# Patient Record
Sex: Female | Born: 1935
Health system: Southern US, Community
[De-identification: ages and names within clinical notes are randomized; demographics above are authoritative.]

## PROBLEM LIST (undated history)

## (undated) DIAGNOSIS — G250 Essential tremor: Secondary | ICD-10-CM

## (undated) DIAGNOSIS — Z86718 Personal history of other venous thrombosis and embolism: Secondary | ICD-10-CM

## (undated) DIAGNOSIS — G252 Other specified forms of tremor: Secondary | ICD-10-CM

## (undated) DIAGNOSIS — D689 Coagulation defect, unspecified: Secondary | ICD-10-CM

## (undated) DIAGNOSIS — A0472 Enterocolitis due to Clostridium difficile, not specified as recurrent: Secondary | ICD-10-CM

## (undated) DIAGNOSIS — F419 Anxiety disorder, unspecified: Secondary | ICD-10-CM

## (undated) DIAGNOSIS — N189 Chronic kidney disease, unspecified: Secondary | ICD-10-CM

## (undated) DIAGNOSIS — I10 Essential (primary) hypertension: Secondary | ICD-10-CM

## (undated) DIAGNOSIS — H269 Unspecified cataract: Secondary | ICD-10-CM

## (undated) DIAGNOSIS — B029 Zoster without complications: Secondary | ICD-10-CM

## (undated) DIAGNOSIS — T7840XA Allergy, unspecified, initial encounter: Secondary | ICD-10-CM

## (undated) DIAGNOSIS — I2699 Other pulmonary embolism without acute cor pulmonale: Secondary | ICD-10-CM

## (undated) DIAGNOSIS — G459 Transient cerebral ischemic attack, unspecified: Secondary | ICD-10-CM

## (undated) DIAGNOSIS — C801 Malignant (primary) neoplasm, unspecified: Secondary | ICD-10-CM

## (undated) DIAGNOSIS — B370 Candidal stomatitis: Secondary | ICD-10-CM

## (undated) DIAGNOSIS — I639 Cerebral infarction, unspecified: Secondary | ICD-10-CM

## (undated) DIAGNOSIS — E785 Hyperlipidemia, unspecified: Secondary | ICD-10-CM

## (undated) DIAGNOSIS — M810 Age-related osteoporosis without current pathological fracture: Secondary | ICD-10-CM

## (undated) DIAGNOSIS — D649 Anemia, unspecified: Secondary | ICD-10-CM

## (undated) HISTORY — DX: Allergy, unspecified, initial encounter: T78.40XA

## (undated) HISTORY — PX: ABDOMINAL HYSTERECTOMY: SHX81

## (undated) HISTORY — DX: Zoster without complications: B02.9

## (undated) HISTORY — DX: Coagulation defect, unspecified: D68.9

## (undated) HISTORY — DX: Anemia, unspecified: D64.9

## (undated) HISTORY — DX: Enterocolitis due to Clostridium difficile, not specified as recurrent: A04.72

## (undated) HISTORY — DX: Unspecified cataract: H26.9

## (undated) HISTORY — DX: Cerebral infarction, unspecified: I63.9

## (undated) HISTORY — PX: FRACTURE SURGERY: SHX138

## (undated) HISTORY — DX: Malignant (primary) neoplasm, unspecified: C80.1

## (undated) HISTORY — PX: EYE SURGERY: SHX253

## (undated) HISTORY — DX: Personal history of other venous thrombosis and embolism: Z86.718

## (undated) HISTORY — DX: Transient cerebral ischemic attack, unspecified: G45.9

## (undated) HISTORY — DX: Candidal stomatitis: B37.0

## (undated) HISTORY — DX: Other specified forms of tremor: G25.0

## (undated) HISTORY — DX: Essential tremor: G25.2

## (undated) HISTORY — DX: Essential (primary) hypertension: I10

## (undated) HISTORY — DX: Hyperlipidemia, unspecified: E78.5

## (undated) HISTORY — DX: Age-related osteoporosis without current pathological fracture: M81.0

## (undated) HISTORY — DX: Chronic kidney disease, unspecified: N18.9

## (undated) HISTORY — DX: Anxiety disorder, unspecified: F41.9

## (undated) HISTORY — PX: OOPHORECTOMY: SHX86

---
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
## 1959-12-10 HISTORY — PX: APPENDECTOMY: SHX54

## 1995-12-10 DIAGNOSIS — Z86718 Personal history of other venous thrombosis and embolism: Secondary | ICD-10-CM

## 1995-12-10 HISTORY — DX: Personal history of other venous thrombosis and embolism: Z86.718

## 1995-12-10 HISTORY — PX: ABDOMINAL HYSTERECTOMY: SHX81

## 1995-12-10 HISTORY — PX: TOTAL ABDOMINAL HYSTERECTOMY W/ BILATERAL SALPINGOOPHORECTOMY: SHX83

## 1998-04-19 ENCOUNTER — Other Ambulatory Visit: Admission: RE | Admit: 1998-04-19 | Discharge: 1998-04-19 | Payer: Self-pay | Admitting: Gynecology

## 1998-06-26 ENCOUNTER — Other Ambulatory Visit: Admission: RE | Admit: 1998-06-26 | Discharge: 1998-06-26 | Payer: Self-pay | Admitting: *Deleted

## 1998-10-23 ENCOUNTER — Other Ambulatory Visit: Admission: RE | Admit: 1998-10-23 | Discharge: 1998-10-23 | Payer: Self-pay | Admitting: Gynecology

## 1999-04-26 ENCOUNTER — Other Ambulatory Visit: Admission: RE | Admit: 1999-04-26 | Discharge: 1999-04-26 | Payer: Self-pay | Admitting: Gynecology

## 1999-08-11 ENCOUNTER — Encounter: Payer: Self-pay | Admitting: Emergency Medicine

## 1999-08-11 ENCOUNTER — Emergency Department (HOSPITAL_COMMUNITY): Admission: EM | Admit: 1999-08-11 | Discharge: 1999-08-11 | Payer: Self-pay | Admitting: Emergency Medicine

## 1999-11-15 ENCOUNTER — Other Ambulatory Visit: Admission: RE | Admit: 1999-11-15 | Discharge: 1999-11-15 | Payer: Self-pay | Admitting: Gynecology

## 2000-06-03 ENCOUNTER — Other Ambulatory Visit: Admission: RE | Admit: 2000-06-03 | Discharge: 2000-06-03 | Payer: Self-pay | Admitting: Gynecology

## 2000-11-25 ENCOUNTER — Other Ambulatory Visit: Admission: RE | Admit: 2000-11-25 | Discharge: 2000-11-25 | Payer: Self-pay | Admitting: Gynecology

## 2001-05-06 ENCOUNTER — Ambulatory Visit (HOSPITAL_COMMUNITY): Admission: RE | Admit: 2001-05-06 | Discharge: 2001-05-06 | Payer: Self-pay | Admitting: Cardiovascular Disease

## 2001-06-09 ENCOUNTER — Other Ambulatory Visit: Admission: RE | Admit: 2001-06-09 | Discharge: 2001-06-09 | Payer: Self-pay | Admitting: Gynecology

## 2001-12-14 ENCOUNTER — Other Ambulatory Visit: Admission: RE | Admit: 2001-12-14 | Discharge: 2001-12-14 | Payer: Self-pay | Admitting: Gynecology

## 2002-07-08 ENCOUNTER — Other Ambulatory Visit: Admission: RE | Admit: 2002-07-08 | Discharge: 2002-07-08 | Payer: Self-pay | Admitting: Gynecology

## 2003-07-12 ENCOUNTER — Other Ambulatory Visit: Admission: RE | Admit: 2003-07-12 | Discharge: 2003-07-12 | Payer: Self-pay | Admitting: Gynecology

## 2004-08-02 ENCOUNTER — Other Ambulatory Visit: Admission: RE | Admit: 2004-08-02 | Discharge: 2004-08-02 | Payer: Self-pay | Admitting: Gynecology

## 2005-06-14 ENCOUNTER — Ambulatory Visit: Payer: Self-pay

## 2005-08-08 ENCOUNTER — Other Ambulatory Visit: Admission: RE | Admit: 2005-08-08 | Discharge: 2005-08-08 | Payer: Self-pay | Admitting: Gynecology

## 2006-08-14 ENCOUNTER — Other Ambulatory Visit: Admission: RE | Admit: 2006-08-14 | Discharge: 2006-08-14 | Payer: Self-pay | Admitting: Gynecology

## 2007-08-25 ENCOUNTER — Other Ambulatory Visit: Admission: RE | Admit: 2007-08-25 | Discharge: 2007-08-25 | Payer: Self-pay | Admitting: Gynecology

## 2009-02-23 ENCOUNTER — Encounter (INDEPENDENT_AMBULATORY_CARE_PROVIDER_SITE_OTHER): Payer: Self-pay | Admitting: Orthopedic Surgery

## 2009-02-23 ENCOUNTER — Ambulatory Visit (HOSPITAL_BASED_OUTPATIENT_CLINIC_OR_DEPARTMENT_OTHER): Admission: RE | Admit: 2009-02-23 | Discharge: 2009-02-23 | Payer: Self-pay | Admitting: Orthopedic Surgery

## 2011-03-21 LAB — POCT HEMOGLOBIN-HEMACUE: Hemoglobin: 13.6 g/dL (ref 12.0–15.0)

## 2011-04-23 NOTE — Op Note (Signed)
NAME:  Monica Curry, Monica Curry NO.:  [id number]   MEDICAL RECORD NO.:  [id number]          PATIENT TYPE:  AMB   LOCATION:  DSC                          FACILITY:  [facility]   PHYSICIAN:  Cindee Salt, M.D.       DATE OF BIRTH:  27-Jul-1931   DATE OF PROCEDURE:  DATE OF DISCHARGE:                               OPERATIVE REPORT   PREOPERATIVE DIAGNOSIS:  Mass right thumb.   POSTOPERATIVE DIAGNOSIS:  Mass right thumb.   OPERATION:  Excision mass right thumb.   SURGEON:  Cindee Salt, MD   ASSISTANT:  Carolyne Fiscal, RN   ANESTHESIA:  Forearm-based IV regional.   ANESTHESIOLOGIST:  Dr. Jean Rosenthal.   HISTORY:  The patient is a 77 year old female with a history of a mass  on the pulp of her right thumb.  This has become painful for her.  She  is desirous of having this excised.  She is aware of risks and  complications including infection, recurrence of injury to arteries,  nerves, tendons, incomplete relief of symptoms, dystrophy, painful scar  in the pulp.  In the preoperative area, the patient is seen, the  extremity marked by both the patient and surgeon, antibiotic given, TED  stockings placed for a history of DVT.   The patient was brought to the operating room where a forearm-based IV  regional anesthetic was carried out without difficulty.  She was prepped  using ChloraPrep, supine position with her right arm free.  A time-out  was taken.  A metacarpal block was given with 0.25% Marcaine without  epinephrine.  An incision was made on the radial aspect of the thumb  pulp, carried down through subcutaneous tissue, this was directly over  the mass.  Blunt and sharp dissection, this was followed down to a  deeply seated pleural type mass which was extremely hard.  This was  found to have eroded into the bone.  This was excised in toto.  The area  was curetted.  The specimen was sent to Pathology.  No further lesions  were identified.  The wound was irrigated.  The skin closed with  interrupted 5-0 Vicryl Rapide sutures.  A compressive dressing to the  thumb was applied.  The patient tolerated the procedure well and was  taken to the recovery room for observation in satisfactory condition.  She will be discharged home to return to the [facility]  in 1 week on [place].           ______________________________  Cindee Salt, M.D.    GK/MEDQ  D:  02/23/2009  T:  02/23/2009  Job:  [id number]   cc:   Lovenia Kim, D.O.

## 2011-10-10 DIAGNOSIS — M810 Age-related osteoporosis without current pathological fracture: Secondary | ICD-10-CM | POA: Insufficient documentation

## 2011-10-15 ENCOUNTER — Other Ambulatory Visit: Payer: Self-pay | Admitting: Gynecology

## 2012-10-20 ENCOUNTER — Other Ambulatory Visit: Payer: Self-pay | Admitting: Gynecology

## 2013-03-24 ENCOUNTER — Other Ambulatory Visit (HOSPITAL_COMMUNITY): Payer: Self-pay | Admitting: Internal Medicine

## 2013-03-24 ENCOUNTER — Encounter (HOSPITAL_COMMUNITY): Payer: Self-pay

## 2013-03-24 ENCOUNTER — Ambulatory Visit (HOSPITAL_COMMUNITY)
Admission: RE | Admit: 2013-03-24 | Discharge: 2013-03-24 | Disposition: A | Discharge status: Home / Self Care | Payer: Medicare Other | Source: Ambulatory Visit | Attending: Internal Medicine | Admitting: Internal Medicine

## 2013-03-24 DIAGNOSIS — M5137 Other intervertebral disc degeneration, lumbosacral region: Secondary | ICD-10-CM | POA: Insufficient documentation

## 2013-03-24 DIAGNOSIS — M546 Pain in thoracic spine: Secondary | ICD-10-CM | POA: Insufficient documentation

## 2013-03-24 DIAGNOSIS — M4120 Other idiopathic scoliosis, site unspecified: Secondary | ICD-10-CM | POA: Insufficient documentation

## 2013-03-24 DIAGNOSIS — M51379 Other intervertebral disc degeneration, lumbosacral region without mention of lumbar back pain or lower extremity pain: Secondary | ICD-10-CM | POA: Insufficient documentation

## 2013-03-24 DIAGNOSIS — IMO0002 Reserved for concepts with insufficient information to code with codable children: Secondary | ICD-10-CM

## 2013-07-02 ENCOUNTER — Encounter: Payer: Self-pay | Admitting: Neurology

## 2013-07-02 ENCOUNTER — Ambulatory Visit (INDEPENDENT_AMBULATORY_CARE_PROVIDER_SITE_OTHER): Payer: Medicare Other | Admitting: Neurology

## 2013-07-02 VITALS — BP 143/78 | HR 53 | Temp 97.0°F | Ht 65.0 in | Wt 148.0 lb

## 2013-07-02 DIAGNOSIS — F411 Generalized anxiety disorder: Secondary | ICD-10-CM

## 2013-07-02 DIAGNOSIS — F419 Anxiety disorder, unspecified: Secondary | ICD-10-CM

## 2013-07-02 DIAGNOSIS — G252 Other specified forms of tremor: Secondary | ICD-10-CM

## 2013-07-02 DIAGNOSIS — G250 Essential tremor: Secondary | ICD-10-CM

## 2013-07-02 MED ORDER — PROPRANOLOL HCL 20 MG PO TABS: 20.0000 mg | ORAL_TABLET | Freq: Three times a day (TID) | ORAL | Status: DC

## 2013-07-02 MED ORDER — CLONAZEPAM 0.5 MG PO TABS: ORAL_TABLET | ORAL | Status: DC

## 2013-07-02 NOTE — Patient Instructions (Addendum)
I think overall you are doing fairly well but I do want to suggest a few things today:  Remember to drink plenty of fluid, eat healthy meals and do not skip any meals. Try to eat protein with a every meal and eat a healthy snack such as fruit or nuts in between meals. Try to keep a regular sleep-wake schedule and try to exercise daily, particularly in the form of walking, 20-30 minutes a day, if you can.   As far as your medications are concerned, I would like to suggest no new changes.    As far as diagnostic testing: no new test needed.    I would like to see you back in 6 months, sooner if we need to. Please call us with any interim questions, concerns, problems, updates or refill requests.  Please also call us for any test results so we can go over those with you on the phone. Brett Canales is my clinical assistant and will answer any of your questions and relay your messages to me and also relay most of my messages to you.  Our phone number is [phone]. We also have an after hours call service for urgent matters and there is a physician on-call for urgent questions. For any emergencies you know to call 911 or go to the nearest emergency room.

## 2013-07-02 NOTE — Progress Notes (Signed)
Subjective:    Patient ID: Monica Curry is a 79 y.o. female.  HPI  Interim history:   Monica Curry is a very pleasant 79 year old right-handed woman who presents for followup consultation of her tremors. She previously saw Dr. Fayrene Fearing love and was last seen by him on 12/31/2012, and which time he started her on propranolol. She has an underlying medical history of uterine cancer, anxiety and PE. She is status post hysterectomy and tonsillectomy. She felt the propranolol 60 mg long-acting once daily was not as effective and is now back on propranolol 20 mg tid, clonazepam 0.5 mg tablet one in the morning, have known and half in the evening, Premarin, calcium and vitamin D, baby aspirin and atelvia 1/week. Activity makes the tremor worse. She does note an improvement with alcohol and takes only one propranolol when she has had a drink. She drinks occasionally.   I reviewed Dr. Imagene Gurney prior notes and the patient's records and below is a summary of that review:  79 year old right-handed woman with a history of tremor for many years, treated with clonazepam for a number of years, but tremor exacerbated in 1997 after her pulmonary embolus. She had withdrawal symptoms coming off of clonazepam. She had been on Mysoline but did not tolerate it. She has seen several other doctors for this tremor. TSH was negative in the past. She was tried on methazolamide without benefit.   She has no new complaints today. She had blood work in 03/24/13 and I reviewed it, and the TSH, Lipids, e-lytes and CBC, Vit D were normal. X ray lumbar spine from 03/24/13 showed T9 compression Fx, age indeterminate.   Her Past Medical History Is Significant For: Past Medical History  Diagnosis Date  . Disorder of bone and cartilage, unspecified   . Personal history of malignant neoplasm of other parts of uterus   . Personal history of venous thrombosis and embolism   . Essential and other specified forms of tremor     Her Past  Surgical History Is Significant For: Past Surgical History  Procedure Laterality Date  . Cesarean section  1961  . Abdominal hysterectomy  1997  . Tonsilectomy/adenoidectomy with myringotomy  1944    Her Family History Is Significant For: Family History  Problem Relation Age of Onset  . Cancer Mother   . Cancer Father   . Cancer Brother   . Cancer Brother   . Heart disease Brother   . Heart disease Brother   . Heart disease Sister   . Heart disease Sister   . Diabetes Sister     Her Social History Is Significant For: History   Social History  . Marital Status: Widowed    Spouse Name: N/A    Number of Children: N/A  . Years of Education: N/A   Social History Main Topics  . Smoking status: Never Smoker   . Smokeless tobacco: None  . Alcohol Use: 0.5 oz/week    1 drink(s) per week  . Drug Use: No  . Sexually Active: None   Other Topics Concern  . None   Social History Narrative  . None    Her Allergies Are:  Allergies  Allergen Reactions  . Codeine   :   Her Current Medications Are:  Outpatient Encounter Prescriptions as of 07/02/2013  Medication Sig Dispense Refill  . aspirin 81 MG tablet Take 81 mg by mouth daily.      . ATELVIA 35 MG TBEC       .  Calcium-Magnesium-Vitamin D 500-50-100 MG-MG-UNIT CHEW Chew 1 tablet by mouth daily.      . Cholecalciferol (VITAMIN D PO) Take 1 tablet by mouth daily.      . clonazePAM (KLONOPIN) 0.5 MG tablet Take 1 tablet by mouth as needed.      Marland Kitchen PREMARIN vaginal cream       . propranolol (INDERAL) 20 MG tablet Take 1 tablet by mouth 3 (three) times daily.       No facility-administered encounter medications on file as of 07/02/2013.  : Review of Systems  HENT: Positive for tinnitus.   Neurological: Positive for tremors.       Restless leg  Hematological: Bruises/bleeds easily.    Objective:  Neurologic Exam  Physical Exam Physical Examination:   Filed Vitals:   07/02/13 1108  BP: 143/78  Pulse: 53  Temp:  97 F (36.1 C)    General Examination: The patient is a very pleasant 79 y.o. female in no acute distress. She appears well-developed and well-nourished and well groomed.   HEENT: Normocephalic, atraumatic, pupils are equal, round and reactive to light and accommodation. Extraocular tracking is good without limitation to gaze excursion or nystagmus noted. Normal smooth pursuit is noted. Hearing is grossly intact. Face is symmetric with normal facial animation and normal facial sensation. Speech is clear with no dysarthria noted. There is no hypophonia. There is a mild lip, neck/head, and jaw tremor and a minimal voice tremor. Neck is supple with full range of passive and active motion. There are no carotid bruits on auscultation. Oropharynx exam reveals: mild mouth dryness, adequate dental hygiene and mild airway crowding. Mallampati is class II. Tongue protrudes centrally and palate elevates symmetrically.    Chest: Clear to auscultation without wheezing, rhonchi or crackles noted.  Heart: S1+S2+0, regular and normal without murmurs, rubs or gallops noted.   Abdomen: Soft, non-tender and non-distended with normal bowel sounds appreciated on auscultation.  Extremities: There is no pitting edema in the distal lower extremities bilaterally. Pedal pulses are intact.  Skin: Warm and dry without trophic changes noted. There are no varicose veins.   Musculoskeletal: exam reveals no obvious joint deformities, tenderness or joint swelling or erythema.   Neurologically:  Mental status: The patient is awake, alert and oriented in all 4 spheres. Her memory, attention, language and knowledge are appropriate. There is no aphasia, agnosia, apraxia or anomia. Speech is clear with normal prosody and enunciation. Thought process is linear. Mood is congruent and affect is normal.  Cranial nerves are as described above under HEENT exam. In addition, shoulder shrug is normal with equal shoulder height  noted. Motor exam: Normal bulk, strength and tone is noted. There is no drift, or rebound. She has slight postural and action tremor in her hands, which is intermittent. Mildly tremulous handwriting, but legible. Romberg is negative. Reflexes are 2+ throughout. Toes are downgoing bilaterally. Fine motor skills are intact with normal finger taps, normal hand movements, normal rapid alternating patting, normal foot taps and normal foot agility.  Cerebellar testing shows no dysmetria or intention tremor on finger to nose testing. Heel to shin is unremarkable bilaterally. There is no truncal or gait ataxia.  Sensory exam is intact to light touch, pinprick, vibration, temperature sense and proprioception in the upper and lower extremities.  Gait, station and balance are unremarkable. No veering to one side is noted. No leaning to one side is noted. Posture is age-appropriate and stance is narrow based. No problems turning are noted. She turns  en bloc. Tandem walk is unremarkable. Intact toe and heel stance is noted.               Assessment and Plan:   Assessment and Plan:  In summary, Monica Curry is a very pleasant 79 y.o.-year old female with a history of ET. Her physical exam is stable and she has not progressed in the last 6 months. I reassured the patient in that regard.  I had a long chat with the patient about my findings and the diagnosis of essential tremor, the prognosis and treatment options. We talked about medical treatments and non-pharmacological approaches. We talked about maintaining a healthy lifestyle in general. I encouraged the patient to eat healthy, exercise daily and keep well hydrated, to keep a scheduled bedtime and wake time routine, to not skip any meals and eat healthy snacks in between meals and to have protein with every meal.   As far as further diagnostic testing is concerned, I suggested the following today: no new test. I reviewed her recent blood work.  As far as  medications are concerned, I recommended the following at this time: no change. I renewed her prescriptions for propranolol and clonazepam. I answered all her questions today and the patient was in agreement with the above outlined plan. I would like to see the patient back in 6 months, sooner if the need arises and encouraged her to call with any interim questions, concerns, problems or updates and refill requests.

## 2013-09-17 ENCOUNTER — Other Ambulatory Visit: Payer: Self-pay | Admitting: Dermatology

## 2013-10-11 LAB — HM MAMMOGRAPHY

## 2013-10-21 ENCOUNTER — Encounter: Payer: Self-pay | Admitting: Gynecology

## 2013-10-21 ENCOUNTER — Other Ambulatory Visit (HOSPITAL_COMMUNITY)
Admission: RE | Admit: 2013-10-21 | Discharge: 2013-10-21 | Disposition: A | Discharge status: Home / Self Care | Payer: Medicare Other | Source: Ambulatory Visit | Attending: Gynecology | Admitting: Gynecology

## 2013-10-21 ENCOUNTER — Ambulatory Visit (INDEPENDENT_AMBULATORY_CARE_PROVIDER_SITE_OTHER): Payer: Medicare Other | Admitting: Gynecology

## 2013-10-21 VITALS — BP 120/76 | Ht 64.0 in | Wt 150.0 lb

## 2013-10-21 DIAGNOSIS — M810 Age-related osteoporosis without current pathological fracture: Secondary | ICD-10-CM

## 2013-10-21 DIAGNOSIS — Z124 Encounter for screening for malignant neoplasm of cervix: Secondary | ICD-10-CM | POA: Insufficient documentation

## 2013-10-21 DIAGNOSIS — Z8542 Personal history of malignant neoplasm of other parts of uterus: Secondary | ICD-10-CM | POA: Insufficient documentation

## 2013-10-21 DIAGNOSIS — C55 Malignant neoplasm of uterus, part unspecified: Secondary | ICD-10-CM

## 2013-10-21 DIAGNOSIS — N368 Other specified disorders of urethra: Secondary | ICD-10-CM

## 2013-10-21 DIAGNOSIS — Z86718 Personal history of other venous thrombosis and embolism: Secondary | ICD-10-CM | POA: Insufficient documentation

## 2013-10-21 DIAGNOSIS — N952 Postmenopausal atrophic vaginitis: Secondary | ICD-10-CM

## 2013-10-21 MED ORDER — RISEDRONATE SODIUM 35 MG PO TBEC: 1.0000 | DELAYED_RELEASE_TABLET | ORAL | Status: DC

## 2013-10-21 NOTE — Patient Instructions (Signed)
Followup for bone density as scheduled. 

## 2013-10-21 NOTE — Progress Notes (Signed)
Monica Curry 1931/09/28 [id number]        79 y.o.  G2P2001 new patient for followup exam.  Former patient of Dr. Nicholas Lose for several issues noted below.  Past medical history,surgical history, problem list, medications, allergies, family history and social history were all reviewed and documented in the EPIC chart.  ROS:  Performed and pertinent positives and negatives are included in the history, assessment and plan .  Exam: Kim assistant Filed Vitals:   10/21/13 1513  BP: 120/76  Height: 5\' 4"  (1.626 m)  Weight: 150 lb (68.04 kg)   General appearance  Normal Skin grossly normal Head/Neck normal with no cervical or supraclavicular adenopathy thyroid normal Lungs  clear Cardiac RR, without RMG Abdominal  soft, nontender, without masses, organomegaly or hernia Breasts  examined lying and sitting without masses, retractions, discharge or axillary adenopathy. Pelvic  Ext/BUS/vagina  normal with atrophic changes. Mild urethral prolapse noted. Pap of cuff done  Adnexa  Without masses or tenderness    Anus and perineum  normal   Rectovaginal  normal sphincter tone without palpated masses or tenderness.    Assessment/Plan:  79 y.o. G57P2001 female for annual exam.   1. Postmenopausal/atrophic genital changes. Had been using Premarin vaginal cream weekly. Is not sexually active. History of DVT with pulmonary embolus following surgery. History of endometrial carcinoma. Recommended patient stop the Premarin cream as it does not seem that she is achieving great benefit from this. Issues of absorption although low reviewed and ultimately she agrees we will stop the vaginal cream. If she does develop symptoms of vaginal dryness or irritation she'll present for further discussion. 2. Well differentiated endometrial adenocarcinoma predominantly in situ with focal minimal myometrial invasion (2 mm) 1997 status post TAH/BSO. NED. Pap of cuff done. 3. Osteoporosis. DEXA 2012 with T score -2.4. Prior  DEXA 2000 with T score -3.3. Had been on Fosamax for over 10 years. Ultimately switched to Atelvia over the past 2 years by Dr. Nicholas Lose. Recently had spine films which showed a T9 compression fracture of indeterminate age. No other fractures and on x-ray was read out as osteopenia. Issues of long-term bisphosphonate use and risks of atypical fracture as well as osteonecrosis of the jaw reviewed. Questionable compression fracture as far as when this occurred and to consider this on treatment or not or a failure of treatment or not discussed. Options to switch to a different medication to include Prolia with its risks reviewed to include osteonecrosis of jaw atypical fractures rashes and infections. Forteo given fracture with its need for daily injection and risks to include osteosarcoma. Options for referral to [facility] endocrinology Dr. Gwendolyn Grant for second opinion also discussed and offered. After lengthy discussion the patient this point with repeat her DEXA now at a two-year interval regardless of the fracture history and then rediscuss her treatment options. I did refill her Atelvia for now. Increase calcium vitamin D reviewed as well as weightbearing exercise. 4. Mild urethral prolapse. Asymptomatic and we'll continue to monitor. 5. Mammography 10/2013. Continue annual mammography. SBE monthly reviewed. 6. Pap smear 2013. Continue with annual cytology given history of endometrial carcinoma. 7. Colonoscopy 2012. Repeat at their recommended interval. 8. Health maintenance. No blood work done this this is all done through her primary physician's office. Followup for DEXA otherwise one year, sooner as needed  Note: This document was prepared with digital dictation and possible smart phrase technology. Any transcriptional errors that result from this process are unintentional.   Dara Lords MD, 4:13  PM 10/21/2013

## 2013-10-22 LAB — URINALYSIS W MICROSCOPIC + REFLEX CULTURE
Bacteria, UA: NONE SEEN
Bilirubin Urine: NEGATIVE
Crystals: NONE SEEN
Ketones, ur: NEGATIVE mg/dL
Protein, ur: NEGATIVE mg/dL
Specific Gravity, Urine: 1.008 (ref 1.005–1.030)
Urobilinogen, UA: 0.2 mg/dL (ref 0.0–1.0)

## 2013-10-23 LAB — URINE CULTURE: Organism ID, Bacteria: NO GROWTH

## 2013-11-08 ENCOUNTER — Ambulatory Visit (INDEPENDENT_AMBULATORY_CARE_PROVIDER_SITE_OTHER): Payer: Medicare Other

## 2013-11-08 DIAGNOSIS — M810 Age-related osteoporosis without current pathological fracture: Secondary | ICD-10-CM

## 2013-11-09 ENCOUNTER — Encounter: Payer: Self-pay | Admitting: Gynecology

## 2013-11-17 ENCOUNTER — Telehealth: Payer: Self-pay | Admitting: *Deleted

## 2013-11-17 NOTE — Telephone Encounter (Signed)
Pt also asked what you recommend OTC for vaginal dryness since premarin has discontinued.

## 2013-11-17 NOTE — Telephone Encounter (Signed)
Pt called received her bone density results in mail, had a question if she has a new compression fractures? Pt said she had x-ray that was order by PCP that showed that she had one months ago. Just wanted to know if any new? And also she asked if a IVA test was done with this dexa? Please advise

## 2013-11-17 NOTE — Telephone Encounter (Signed)
Pt informed with the below note. 

## 2013-11-17 NOTE — Telephone Encounter (Signed)
1. Relpens is one option for OTC lubricant 2. Bone density appears to be stable from prior density at Dr. Johnn Hai office. I would stay on the Atelvia for now. I do not think a VFA would add anything to the x-rays that were done already.

## 2013-11-24 ENCOUNTER — Encounter: Payer: Self-pay | Admitting: Internal Medicine

## 2013-11-25 ENCOUNTER — Encounter: Payer: Self-pay | Admitting: Internal Medicine

## 2013-11-25 ENCOUNTER — Ambulatory Visit (INDEPENDENT_AMBULATORY_CARE_PROVIDER_SITE_OTHER): Payer: Medicare Other | Admitting: Internal Medicine

## 2013-11-25 VITALS — BP 130/76 | HR 60 | Temp 97.7°F | Resp 18 | Wt 148.4 lb

## 2013-11-25 DIAGNOSIS — M810 Age-related osteoporosis without current pathological fracture: Secondary | ICD-10-CM

## 2013-11-25 DIAGNOSIS — R7309 Other abnormal glucose: Secondary | ICD-10-CM

## 2013-11-25 DIAGNOSIS — E782 Mixed hyperlipidemia: Secondary | ICD-10-CM

## 2013-11-25 DIAGNOSIS — E559 Vitamin D deficiency, unspecified: Secondary | ICD-10-CM | POA: Insufficient documentation

## 2013-11-25 DIAGNOSIS — G250 Essential tremor: Secondary | ICD-10-CM

## 2013-11-25 DIAGNOSIS — I10 Essential (primary) hypertension: Secondary | ICD-10-CM

## 2013-11-25 LAB — CBC WITH DIFFERENTIAL/PLATELET
Basophils Absolute: 0 10*3/uL (ref 0.0–0.1)
Basophils Relative: 1 % (ref 0–1)
Eosinophils Relative: 4 % (ref 0–5)
HCT: 41.1 % (ref 36.0–46.0)
Hemoglobin: 13.9 g/dL (ref 12.0–15.0)
MCH: 28.7 pg (ref 26.0–34.0)
MCHC: 33.8 g/dL (ref 30.0–36.0)
MCV: 84.9 fL (ref 78.0–100.0)
Monocytes Absolute: 0.5 10*3/uL (ref 0.1–1.0)
Monocytes Relative: 11 % (ref 3–12)
Neutro Abs: 1.5 10*3/uL — ABNORMAL LOW (ref 1.7–7.7)
Platelets: 220 10*3/uL (ref 150–400)
RDW: 15 % (ref 11.5–15.5)

## 2013-11-25 NOTE — Progress Notes (Signed)
Patient ID: Monica Curry, female   DOB: 1931-10-09, 79 y.o.   MRN: [id number]   This very nice 79 y.o.female presents for follow up of CBC abnormality and with HX/o Hypertension, Hyperlipidemia, Pre-Diabetes and Vitamin D Deficiency.    Patient has Hx/o labile BP being monitored expectantly. In 2000, she had a negative cardiolyte scan. has been controlled at home. Today's BP is BP: 130/76 mmHg.  October labs did find BUN/Creat 17/1.07 and calc GFR 49 consistant with moderate renal insufficiency - most likely hypertensive nephrosclerosis. Patient denies any cardiac type chest pain, palpitations, dyspnea/orthopnea/PND, dizziness, claudication, or dependent edema.   Hyperlipidemia is controlled with diet. Last Cholesterol was  166, Triglycerides were 144, HDL 44 and LDL 93 at goal in October. Patient denies myalgias or other med SE's.    Also, the patient has history of abnormal glucose last A1c of 5.4% with insulin 6 in October . Patient denies any symptoms of reactive hypoglycemia, diabetic polys, paresthesias or visual blurring.   Further, Patient has history of Vitamin D Deficiency with last vitamin D of 54. Patient supplements vitamin D without any suspected side-effects. She does have diagnosis of osteoporosis (t=-3.0 in the spine) and she was diagnosed with a T9 compression Fx in April 2014 by Dr Audie Box and is on Risedronate weekly.     Medication List     aspirin 81 MG tablet  Take 81 mg by mouth daily.     Calcium-Magnesium-Vitamin D 500-50-100 MG-MG-UNIT Chew  Chew 1 tablet by mouth daily.     clonazePAM 0.5 MG tablet  Commonly known as:  KLONOPIN  Take 1 pill in AM, 1/2 pill at noon and 1/2 pill at night.     propranolol 20 MG tablet  Commonly known as:  INDERAL  Take 1 tablet (20 mg total) by mouth 3 (three) times daily.     Risedronate Sodium 35 MG Tbec  Commonly known as:  ATELVIA  Take 1 tablet (35 mg total) by mouth once a week.     VITAMIN D PO  Take 1 tablet by  mouth daily.          Allergies  Allergen Reactions  . Codeine   . Mysoline [Primidone]     PMHx:   Past Medical History  Diagnosis Date  . Personal history of malignant neoplasm of other parts of uterus 1997  . Personal history of venous thrombosis and embolism 1997  . Essential and other specified forms of tremor   . Cancer     Endometrial cancer  . Osteoporosis 11/2013    T score -2.4 prior in 2000 -3.3, T9 compression fracture indeterminate duration  . Urethral prolapse     FHx:    Reviewed / unchanged  SHx:    Reviewed / unchanged  Systems Review: Constitutional: Denies fever, chills, wt changes, headaches, insomnia, fatigue, night sweats, change in appetite. Eyes: Denies redness, blurred vision, diplopia, discharge, itchy, watery eyes.  ENT: Denies discharge, congestion, post nasal drip, epistaxis, sore throat, earache, hearing loss, dental pain, tinnitus, vertigo, sinus pain, snoring.  CV: Denies chest pain, palpitations, irregular heartbeat, syncope, dyspnea, diaphoresis, orthopnea, PND, claudication, edema. Respiratory: denies cough, dyspnea, DOE, pleurisy, hoarseness, laryngitis, wheezing.  Gastrointestinal: Denies dysphagia, odynophagia, heartburn, reflux, water brash, abdominal pain or cramps, nausea, vomiting, bloating, diarrhea, constipation, hematemesis, melena, hematochezia,  or hemorrhoids. Genitourinary: Denies dysuria, frequency, urgency, nocturia, hesitancy, discharge, hematuria, flank pain. Musculoskeletal: Denies arthralgias, myalgias, stiffness, jt. swelling, pain, limp, strain/sprain.  Skin: Denies pruritus, rash, hives, warts,  acne, eczema, change in skin lesion(s). Neuro: No weakness, tremor, incoordination, spasms, paresthesia, or pain. Psychiatric: Denies confusion, memory loss, or sensory loss. Endo: Denies change in weight, skin, hair change.  Heme/Lymph: No excessive bleeding, bruising, orenlarged lymph nodes.  Filed Vitals:   11/25/13 1013   BP: 130/76  Pulse: 60  Temp: 97.7 F (36.5 C)  Resp: 18    Estimated body mass index is 25.46 kg/(m^2) as calculated from the following:   Height as of 10/21/13: 5\' 4"  (1.626 m).   Weight as of this encounter: 148 lb 6.4 oz (67.314 kg).  On Exam: Appears well nourished - in no distress. Eyes: PERRLA, EOMs, conjunctiva no swelling or erythema. Sinuses: No frontal/maxillary tenderness ENT/Mouth: EAC's clear, TM's nl w/o erythema, bulging. Nares clear w/o erythema, swelling, exudates. Oropharynx clear without erythema or exudates. Oral hygiene is good. Tongue normal, non obstructing. Hearing intact.  Neck: Supple. Thyroid nl. Car 2+/2+ without bruits, nodes or JVD. Chest: Respirations nl with BS clear & equal w/o rales, rhonchi, wheezing or stridor.  Cor: Heart sounds normal w/ regular rate and rhythm without sig. murmurs, gallops, clicks, or rubs. Peripheral pulses normal and equal  without edema.  Abdomen: Soft & bowel sounds normal. Non-tender w/o guarding, rebound, hernias, masses, or organomegaly.  Lymphatics: Unremarkable.  Musculoskeletal: Full ROM all peripheral extremities, joint stability, 5/5 strength, and normal gait.  Skin: Warm, dry without exposed rashes, lesions, ecchymosis apparent.  Neuro: Cranial nerves intact, reflexes equal bilaterally. Sensory-motor testing grossly intact. Tendon reflexes grossly intact.  Pysch: Alert & oriented x 3. Insight and judgement nl & appropriate. No ideations.  Assessment and Plan:  1. Hypertension - Continue monitor blood pressure at home. Continue diet/meds same.  2. Hyperlipidemia - Continue diet/meds, exercise,& lifestyle modifications. Continue monitor periodic cholesterol/liver & renal functions   3. Pre-diabetes- Continue diet, exercise, lifestyle modifications. Monitor appropriate labs.  4. Vitamin D Deficiency/ Osteoporosis - Continue Vit D supplementation and bisphosphanates recc exercise  5. ? Abn CBC -  recheck  Recommended regular exercise, BP monitoring, weight control, and discussed med and SE's. Recommended labs to assess and monitor clinical status. Further disposition pending results of labs.

## 2013-11-25 NOTE — Patient Instructions (Signed)
Osteoporosis Throughout your life, your body breaks down old bone and replaces it with new bone. As you get older, your body does not replace bone as quickly as it breaks it down. By the age of 30 years, most people begin to gradually lose bone because of the imbalance between bone loss and replacement. Some people lose more bone than others. Bone loss beyond a specified normal degree is considered osteoporosis.  Osteoporosis affects the strength and durability of your bones. The inside of the ends of your bones and your flat bones, like the bones of your pelvis, look like honeycomb, filled with tiny open spaces. As bone loss occurs, your bones become less dense. This means that the open spaces inside your bones become bigger and the walls between these spaces become thinner. This makes your bones weaker. Bones of a person with osteoporosis can become so weak that they can break (fracture) during minor accidents, such as a simple fall. CAUSES  The following factors have been associated with the development of osteoporosis:  Smoking.  Drinking more than 2 alcoholic drinks several days per week.  Long-term use of certain medicines:  Corticosteroids.  Chemotherapy medicines.  Thyroid medicines.  Antiepileptic medicines.  Gonadal hormone suppression medicine.  Immunosuppression medicine.  Being underweight.  Lack of physical activity.  Lack of exposure to the sun. This can lead to vitamin D deficiency.  Certain medical conditions:  Certain inflammatory bowel diseases, such as Crohn disease and ulcerative colitis.  Diabetes.  Hyperthyroidism.  Hyperparathyroidism. RISK FACTORS Anyone can develop osteoporosis. However, the following factors can increase your risk of developing osteoporosis:  Gender Women are at higher risk than men.  Age Being older than 50 years increases your risk.  Ethnicity White and Asian people have an increased risk.  Weight Being extremely  underweight can increase your risk of osteoporosis.  Family history of osteoporosis Having a family member who has developed osteoporosis can increase your risk. SYMPTOMS  Usually, people with osteoporosis have no symptoms.  DIAGNOSIS  Signs during a physical exam that may prompt your caregiver to suspect osteoporosis include:  Decreased height. This is usually caused by the compression of the bones that form your spine (vertebrae) because they have weakened and become fractured.  A curving or rounding of the upper back (kyphosis). To confirm signs of osteoporosis, your caregiver may request a procedure that uses 2 low-dose X-ray beams with different levels of energy to measure your bone mineral density (dual-energy X-ray absorptiometry [DXA]). Also, your caregiver may check your level of vitamin D. TREATMENT  The goal of osteoporosis treatment is to strengthen bones in order to decrease the risk of bone fractures. There are different types of medicines available to help achieve this goal. Some of these medicines work by slowing the processes of bone loss. Some medicines work by increasing bone density. Treatment also involves making sure that your levels of calcium and vitamin D are adequate. PREVENTION  There are things you can do to help prevent osteoporosis. Adequate intake of calcium and vitamin D can help you achieve optimal bone mineral density. Regular exercise can also help, especially resistance and weight-bearing activities. If you smoke, quitting smoking is an important part of osteoporosis prevention. MAKE SURE YOU:  Understand these instructions.  Will watch your condition.  Will get help right away if you are not doing well or get worse. FOR MORE INFORMATION www.osteo.org and www.nof.org Document Released: 09/04/2005 Document Revised: 03/22/2013 Document Reviewed: 11/09/2011 [employer] Patient Information 2014 [employer].     Vitamin D Deficiency Vitamin D is an  important vitamin that your body needs. Having too little of it in your body is called a deficiency. A very bad deficiency can make your bones soft and can cause a condition called rickets.  Vitamin D is important to your body for different reasons, such as:   It helps your body absorb 2 minerals called calcium and phosphorus.  It helps make your bones healthy.  It may prevent some diseases, such as diabetes and multiple sclerosis.  It helps your muscles and heart. You can get vitamin D in several ways. It is a natural part of some foods. The vitamin is also added to some dairy products and cereals. Some people take vitamin D supplements. Also, your body makes vitamin D when you are in the sun. It changes the sun's rays into a form of the vitamin that your body can use. CAUSES   Not eating enough foods that contain vitamin D.  Not getting enough sunlight.  Having certain digestive system diseases that make it hard to absorb vitamin D. These diseases include Crohn's disease, chronic pancreatitis, and cystic fibrosis.  Having a surgery in which part of the stomach or small intestine is removed.  Being obese. Fat cells pull vitamin D out of your blood. That means that obese people may not have enough vitamin D left in their blood and in other body tissues.  Having chronic kidney or liver disease. RISK FACTORS Risk factors are things that make you more likely to develop a vitamin D deficiency. They include:  Being older.  Not being able to get outside very much.  Living in a nursing home.  Having had broken bones.  Having weak or thin bones (osteoporosis).  Having a disease or condition that changes how your body absorbs vitamin D.  Having dark skin.  Some medicines such as seizure medicines or steroids.  Being overweight or obese. SYMPTOMS Mild cases of vitamin D deficiency may not have any symptoms. If you have a very bad case, symptoms may include:  Bone pain.  Muscle  pain.  Falling often.  Broken bones caused by a minor injury, due to osteoporosis. DIAGNOSIS A blood test is the best way to tell if you have a vitamin D deficiency. TREATMENT Vitamin D deficiency can be treated in different ways. Treatment for vitamin D deficiency depends on what is causing it. Options include:  Taking vitamin D supplements.  Taking a calcium supplement. Your caregiver will suggest what dose is best for you. HOME CARE INSTRUCTIONS  Take any supplements that your caregiver prescribes. Follow the directions carefully. Take only the suggested amount.  Have your blood tested 2 months after you start taking supplements.  Eat foods that contain vitamin D. Healthy choices include:  Fortified dairy products, cereals, or juices. Fortified means vitamin D has been added to the food. Check the label on the package to be sure.  Fatty fish like salmon or trout.  Eggs.  Oysters.  Do not use a tanning bed.  Keep your weight at a healthy level. Lose weight if you need to.  Keep all follow-up appointments. Your caregiver will need to perform blood tests to make sure your vitamin D deficiency is going away. SEEK MEDICAL CARE IF:  You have any questions about your treatment.  You continue to have symptoms of vitamin D deficiency.  You have nausea or vomiting.  You are constipated.  You feel confused.  You have severe abdominal or back pain.  MAKE SURE YOU:  Understand these instructions.  Will watch your condition.  Will get help right away if you are not doing well or get worse. Document Released: 02/17/2012 Document Revised: 03/22/2013 Document Reviewed: 02/17/2012 [facility] Patient Information 2014 [place], [place].

## 2013-11-26 LAB — VITAMIN D 25 HYDROXY (VIT D DEFICIENCY, FRACTURES): Vit D, 25-Hydroxy: 58 ng/mL (ref 30–89)

## 2013-12-31 ENCOUNTER — Encounter: Payer: Self-pay | Admitting: Neurology

## 2013-12-31 ENCOUNTER — Encounter (INDEPENDENT_AMBULATORY_CARE_PROVIDER_SITE_OTHER): Payer: Self-pay

## 2013-12-31 ENCOUNTER — Ambulatory Visit (INDEPENDENT_AMBULATORY_CARE_PROVIDER_SITE_OTHER): Payer: Medicare Other | Admitting: Neurology

## 2013-12-31 VITALS — BP 149/84 | HR 59 | Temp 96.6°F | Ht 65.0 in | Wt 150.0 lb

## 2013-12-31 DIAGNOSIS — F411 Generalized anxiety disorder: Secondary | ICD-10-CM

## 2013-12-31 DIAGNOSIS — F419 Anxiety disorder, unspecified: Secondary | ICD-10-CM

## 2013-12-31 DIAGNOSIS — G250 Essential tremor: Secondary | ICD-10-CM

## 2013-12-31 DIAGNOSIS — G252 Other specified forms of tremor: Secondary | ICD-10-CM

## 2013-12-31 MED ORDER — CLONAZEPAM 0.5 MG PO TABS: ORAL_TABLET | ORAL | Status: DC

## 2013-12-31 MED ORDER — PROPRANOLOL HCL 20 MG PO TABS: 20.0000 mg | ORAL_TABLET | Freq: Three times a day (TID) | ORAL | Status: DC

## 2013-12-31 NOTE — Patient Instructions (Signed)
I think overall you are doing fairly well and are stable at this point.   I do have some generic suggestions for you today:  Please make sure that you drink plenty of fluids. I would like for you to exercise daily for example in the form of walking 20-30 minutes every day, if you can. Please keep a regular sleep-wake schedule, keep regular meal times, do not skip any meals, eat  healthy snacks in between meals, such as fruit or nuts. Try to eat protein with every meal.   As far as your medications are concerned, I would like to suggest: no changes.     As far as diagnostic testing, I recommend: no new test from my end of things.   Engage in social activities in your community and with your family and try to keep up with current events by reading the newspaper or watching the news.  I do not think we need to make any changes in your medications at this point. I think you're stable enough that I can see you back in 6 months, sooner if we need to. Please call us if you have any interim questions, concerns, or problems or updates to need to discuss.  Our nursing staff will answer any of your questions and relay your messages to me and will give you my messages.   Our phone number is [phone]. We also have an after hours call service for urgent matters and there is a physician on-call for urgent questions. For any emergencies you know to call 911 or go to the nearest emergency room.

## 2013-12-31 NOTE — Progress Notes (Signed)
Subjective:    Patient ID: Monica Curry is a 80 y.o. female.  HPI    Interim history:   Monica Curry is a very pleasant 80 year old right-handed woman, who presents for followup consultation of her essential tremor. She is unaccompanied today. I first met her on 07/02/2013, at which time I felt her exam was stable and it did not change her medications. I had reviewed recent blood work and did not have any new test at the time. I renewed her prescriptions for propranolol and clonazepam at the time.   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 She feels stable with her tremor, which seems to fluctuate in severity on a day to day basis. She had a cousin with PD and one aunt had a voice tremor. She drives and has had no issues. She tries to avoid driving in the dark and on the interstate. She has mild cataracts, which are followed. She has had no new medical issues or recent medication changes. She had a bone density scan on 10/14/13 and it showed stable findings.    She previously followed with Dr. Morene Antu and last saw him on 12/31/2012, and which time he started her on propranolol.  She has an underlying medical history of uterine cancer, anxiety and PE. She is status post hysterectomy and tonsillectomy. She felt the propranolol 60 mg long-acting once daily was not as effective and was switched to propranolol 20 mg tid, clonazepam 0.5 mg tablet one in the morning, 0.25 mg at noon and 0.25 mg in the evening. Activity makes the tremor worse. She does note an improvement with alcohol and takes only one propranolol when she has had a drink. She drinks occasionally.  She has a history of tremors for many years, treated with clonazepam for a number of years, but tremor exacerbated in 1997 after her pulmonary embolus. She had withdrawal symptoms coming off of clonazepam. She had been on Mysoline but did not tolerate it. She has seen several other doctors for the tremor. TSH  was negative in the past. She was tried on methazolamide without benefit.  X ray lumbar spine from 03/24/13 showed T9 compression fracture, age indeterminate.   Her Past Medical History Is Significant For: Past Medical History  Diagnosis Date  . Personal history of malignant neoplasm of other parts of uterus 1997  . Personal history of venous thrombosis and embolism 1997  . Essential and other specified forms of tremor   . Cancer     Endometrial cancer  . Osteoporosis 11/2013    T score -2.4 prior in 2000 -3.3, T9 compression fracture indeterminate duration  . Urethral prolapse     Her Past Surgical History Is Significant For: Past Surgical History  Procedure Laterality Date  . Cesarean section  1961  . Abdominal hysterectomy  1997  . Tonsilectomy/adenoidectomy with myringotomy  1944  . Oophorectomy      BSO    Her Family History Is Significant For: Family History  Problem Relation Age of Onset  . Cancer Mother   . Cancer Father     Bone  . Cancer Brother     Prostate  . Cancer Brother     Prostate  . Heart disease Brother   . Heart disease Brother   . Heart disease Sister   . Heart disease Sister   . Diabetes Sister     Her Social History Is Significant For: History   Social History  . Marital Status:  Widowed    Spouse Name: N/A    Number of Children: N/A  . Years of Education: N/A   Social History Main Topics  . Smoking status: Never Smoker   . Smokeless tobacco: None  . Alcohol Use: 0.0 oz/week     Comment: ocassionally  . Drug Use: No  . Sexual Activity: No     Comment: HYST   Other Topics Concern  . None   Social History Narrative  . None    Her Allergies Are:  Allergies  Allergen Reactions  . Codeine   . Mysoline [Primidone]   :   Her Current Medications Are:  Outpatient Encounter Prescriptions as of 12/31/2013  Medication Sig  . aspirin 81 MG tablet Take 81 mg by mouth daily.  . Calcium-Magnesium-Vitamin D 500-50-100 MG-MG-UNIT CHEW  Chew 1 tablet by mouth daily.  . Cholecalciferol (VITAMIN D PO) Take 2,000 Units by mouth 2 (two) times daily.   . clonazePAM (KLONOPIN) 0.5 MG tablet Take 1 pill in AM, 1/2 pill at noon and 1/2 pill at night.  . propranolol (INDERAL) 20 MG tablet Take 1 tablet (20 mg total) by mouth 3 (three) times daily.  . Risedronate Sodium (ATELVIA) 35 MG TBEC Take 1 tablet (35 mg total) by mouth once a week.   Review of Systems:  Out of a complete 14 point review of systems, all are reviewed and negative with the exception of these symptoms as listed below:   Review of Systems  Constitutional: Negative.   HENT: Positive for tinnitus.   Eyes: Negative.   Respiratory: Negative.   Cardiovascular: Negative.   Gastrointestinal: Negative.   Endocrine: Negative.   Genitourinary: Negative.   Musculoskeletal: Negative.   Skin: Negative.   Allergic/Immunologic: Negative.   Neurological: Positive for tremors.  Hematological: Bruises/bleeds easily.  Psychiatric/Behavioral: The patient is nervous/anxious.     Objective:  Neurologic Exam  Physical Exam Physical Examination:   Filed Vitals:   12/31/13 1127  BP: 149/84  Pulse: 59  Temp: 96.6 F (35.9 C)   General Examination: The patient is a very pleasant 80 y.o. female in no acute distress. She appears well-developed and well-nourished and very well groomed.   HEENT: Normocephalic, atraumatic, pupils are equal, round and reactive to light and accommodation. Funduscopy was normal. Extraocular tracking is good without limitation to gaze excursion or nystagmus noted. Normal smooth pursuit is noted. Hearing is grossly intact. Face is symmetric with normal facial animation and normal facial sensation. Speech is clear with no dysarthria noted. There is no hypophonia. There is a mild lip, neck/head, and jaw tremor and a minimal voice tremor. Neck is supple with full range of passive and active motion. There are no carotid bruits on auscultation. Oropharynx  exam reveals: mild mouth dryness, adequate dental hygiene and mild airway crowding. Mallampati is class II. Tongue protrudes centrally and palate elevates symmetrically.    Chest: Clear to auscultation without wheezing, rhonchi or crackles noted.  Heart: S1+S2+0, regular and normal without murmurs, rubs or gallops noted.   Abdomen: Soft, non-tender and non-distended with normal bowel sounds appreciated on auscultation.  Extremities: There is no pitting edema in the distal lower extremities bilaterally. Pedal pulses are intact.  Skin: Warm and dry without trophic changes noted. There are no varicose veins.   Musculoskeletal: exam reveals no obvious joint deformities, tenderness or joint swelling or erythema.   Neurologically:  Mental status: The patient is awake, alert and oriented in all 4 spheres. Her memory, attention, language and   knowledge are appropriate. There is no aphasia, agnosia, apraxia or anomia. Speech is clear with normal prosody and enunciation. Thought process is linear. Mood is congruent and affect is normal.  Cranial nerves are as described above under HEENT exam. In addition, shoulder shrug is normal with equal shoulder height noted. Motor exam: Normal bulk, strength and tone is noted. There is no drift, or rebound. She has slight postural and action tremor in her hands, which is intermittent. Mildly tremulous handwriting, but legible. Romberg is negative. Reflexes are 2+ throughout. Fine motor skills are intact with normal finger taps, normal hand movements, normal rapid alternating patting, normal foot taps and normal foot agility.  Cerebellar testing shows no dysmetria or intention tremor on finger to nose testing. There is no truncal or gait ataxia.  Sensory exam is intact to light touch, pinprick, vibration, temperature sense in the upper and lower extremities.  Gait, station and balance are unremarkable. No veering to one side is noted. No leaning to one side is noted.  Posture is age-appropriate and stance is narrow based. No problems turning are noted. She turns en bloc. Tandem walk is unremarkable.   Assessment and Plan:   In summary, Milderd Katen is a very pleasant 82-year old female with a history of ET. Her physical exam is again noted to be stable and she has not progressed in the last 6 months. I reassured the patient in that regard. I again had a discussion with the patient about my findings and the diagnosis of essential tremor, the prognosis and treatment options. We talked about medical treatments and non-pharmacological approaches. We talked about maintaining a healthy lifestyle in general. I encouraged the patient to eat healthy, exercise daily and keep well hydrated, to keep a scheduled bedtime and wake time routine, to not skip any meals and eat healthy snacks in between meals and to have protein with every meal. She is encouraged to drink more water.   As far as further diagnostic testing is concerned, I suggested the following today: no new test needed.   As far as medications are concerned, I recommended the following at this time: no change. I renewed her prescriptions for propranolol and clonazepam. I answered all her questions today and the patient was in agreement with the above outlined plan. I would like to see the patient back in 6 months, sooner if the need arises and encouraged her to call with any interim questions, concerns, problems or updates and refill requests. 

## 2014-01-10 ENCOUNTER — Encounter: Payer: Self-pay | Admitting: Emergency Medicine

## 2014-01-10 ENCOUNTER — Ambulatory Visit (INDEPENDENT_AMBULATORY_CARE_PROVIDER_SITE_OTHER): Payer: Medicare Other | Admitting: Emergency Medicine

## 2014-01-10 VITALS — BP 122/80 | HR 88 | Temp 98.0°F | Resp 18 | Ht 65.0 in | Wt 148.0 lb

## 2014-01-10 DIAGNOSIS — R059 Cough, unspecified: Secondary | ICD-10-CM

## 2014-01-10 DIAGNOSIS — J209 Acute bronchitis, unspecified: Secondary | ICD-10-CM

## 2014-01-10 DIAGNOSIS — R05 Cough: Secondary | ICD-10-CM

## 2014-01-10 DIAGNOSIS — J309 Allergic rhinitis, unspecified: Secondary | ICD-10-CM

## 2014-01-10 MED ORDER — AZELASTINE HCL 0.1 % NA SOLN: 2.0000 | Freq: Two times a day (BID) | NASAL | Status: DC

## 2014-01-10 MED ORDER — METHYLPREDNISOLONE 4 MG PO TABS: 4.0000 mg | ORAL_TABLET | Freq: Every day | ORAL | Status: DC

## 2014-01-10 MED ORDER — PREDNISONE 10 MG PO TABS: ORAL_TABLET | ORAL | Status: DC

## 2014-01-10 MED ORDER — BENZONATATE 200 MG PO CAPS: 200.0000 mg | ORAL_CAPSULE | Freq: Three times a day (TID) | ORAL | Status: DC | PRN

## 2014-01-10 NOTE — Patient Instructions (Addendum)
Allergic Rhinitis Switch to allegra Warm salt water gargles daily. 1 tsp liquid benadryl + 1 tsp liquid Maalox, MIX/ GARGLE/ SPIT as needed for pain  Allergic rhinitis is when the mucous membranes in the nose respond to allergens. Allergens are particles in the air that cause your body to have an allergic reaction. This causes you to release allergic antibodies. Through a chain of events, these eventually cause you to release histamine into the blood stream. Although meant to protect the body, it is this release of histamine that causes your discomfort, such as frequent sneezing, congestion, and an itchy, runny nose.  CAUSES  Seasonal allergic rhinitis (hay fever) is caused by pollen allergens that may come from grasses, trees, and weeds. Year-round allergic rhinitis (perennial allergic rhinitis) is caused by allergens such as house dust mites, pet dander, and mold spores.  SYMPTOMS   Nasal stuffiness (congestion).  Itchy, runny nose with sneezing and tearing of the eyes. DIAGNOSIS  Your health care provider can help you determine the allergen or allergens that trigger your symptoms. If you and your health care provider are unable to determine the allergen, skin or blood testing may be used. TREATMENT  Allergic Rhinitis does not have a cure, but it can be controlled by:  Medicines and allergy shots (immunotherapy).  Avoiding the allergen. Hay fever may often be treated with antihistamines in pill or nasal spray forms. Antihistamines block the effects of histamine. There are over-the-counter medicines that may help with nasal congestion and swelling around the eyes. Check with your health care provider before taking or giving this medicine.  If avoiding the allergen or the medicine prescribed do not work, there are many new medicines your health care provider can prescribe. Stronger medicine may be used if initial measures are ineffective. Desensitizing injections can be used if medicine and  avoidance does not work. Desensitization is when a patient is given ongoing shots until the body becomes less sensitive to the allergen. Make sure you follow up with your health care provider if problems continue. HOME CARE INSTRUCTIONS It is not possible to completely avoid allergens, but you can reduce your symptoms by taking steps to limit your exposure to them. It helps to know exactly what you are allergic to so that you can avoid your specific triggers. SEEK MEDICAL CARE IF:   You have a fever.  You develop a cough that does not stop easily (persistent).  You have shortness of breath.  You start wheezing.  Symptoms interfere with normal daily activities. Document Released: 08/20/2001 Document Revised: 09/15/2013 Document Reviewed: 08/02/2013 [facility] Patient Information 2014 [place]. Bronchitis Tessalon perles or Mucinex/ Delsym Bronchitis is swelling (inflammation) of the air tubes leading to your lungs (bronchi). This causes mucus and a cough. If the swelling gets bad, you may have trouble breathing. HOME CARE   Rest.  Drink enough fluids to keep your pee (urine) clear or pale yellow (unless you have a condition where you have to watch how much you drink).  Only take medicine as told by your doctor. If you were given antibiotic medicines, finish them even if you start to feel better.  Avoid smoke, irritating chemicals, and strong smells. These make the problem worse. Quit smoking if you smoke. This helps your lungs heal faster.  Use a cool mist humidifier. Change the water in the humidifier every day. You can also sit in the bathroom with hot shower running for 5 10 minutes. Keep the door closed.  See your health care  provider as told.  Wash your hands often. GET HELP IF: Your problems do not get better after 1 week. GET HELP RIGHT AWAY IF:   Your fever gets worse.  You have chills.  Your chest hurts.  Your problems breathing get worse.  You have blood in  your mucus.  You pass out (faint).  You feel lightheaded.  You have a bad headache.  You throw up (vomit) again and again. MAKE SURE YOU:  Understand these instructions.  Will watch your condition.  Will get help right away if you are not doing well or get worse. Document Released: 05/13/2008 Document Revised: 09/15/2013 Document Reviewed: 07/20/2013 [facility] Patient Information 2014 [place], [place].

## 2014-01-10 NOTE — Progress Notes (Signed)
Subjective:    Patient ID: Monica Curry, female    DOB: April 06, 1931, 80 y.o.   MRN: [id number]  HPI Comments: 80 yo female with increased cold symptoms. She notes cold symptoms since last Wednesday. She has had dry cough/ sore throat/ laryngitis/ fever which had improved some but has returned. She now has increased sinus drainage despite adding Claritin.   Cough Associated symptoms include a fever, postnasal drip and a sore throat.  Fever  Associated symptoms include coughing and a sore throat.   Current Outpatient Prescriptions on File Prior to Visit  Medication Sig Dispense Refill  . aspirin 81 MG tablet Take 81 mg by mouth daily.      . Calcium-Magnesium-Vitamin D [id number] MG-MG-UNIT CHEW Chew 1 tablet by mouth daily.      . Cholecalciferol (VITAMIN D PO) Take 2,000 Units by mouth 2 (two) times daily.       . clonazePAM (KLONOPIN) 0.5 MG tablet Take 1 pill in AM, 1/2 pill at noon and 1/2 pill at night.  60 tablet  5  . propranolol (INDERAL) 20 MG tablet Take 1 tablet (20 mg total) by mouth 3 (three) times daily.  90 tablet  5  . Risedronate Sodium (ATELVIA) 35 MG TBEC Take 1 tablet (35 mg total) by mouth once a week.  4 tablet  6   No current facility-administered medications on file prior to visit.   ALLERGIES Codeine and Mysoline  Past Medical History  Diagnosis Date  . Personal history of malignant neoplasm of other parts of uterus 1997  . Personal history of venous thrombosis and embolism 1997  . Essential and other specified forms of tremor   . Cancer     Endometrial cancer  . Osteoporosis 11/2013    T score -2.4 prior in 2000 -3.3, T9 compression fracture indeterminate duration  . Urethral prolapse       Review of Systems  Constitutional: Positive for fever.  HENT: Positive for postnasal drip, sore throat and voice change.   Respiratory: Positive for cough.   All other systems reviewed and are negative.   BP 122/80  Pulse 88  Temp(Src) 98 F (36.7 C)  (Temporal)  Resp 18  Ht 5\' 5"  (1.651 m)  Wt 148 lb (67.132 kg)  BMI 24.63 kg/m2     Objective:   Physical Exam  Nursing note and vitals reviewed. Constitutional: She is oriented to person, place, and time. She appears well-developed and well-nourished.  HENT:  Head: Normocephalic and atraumatic.  Right Ear: External ear normal.  Left Ear: External ear normal.  Nose: Nose normal.  Mouth/Throat: Oropharynx is clear and moist.  Cloudy TM's bilaterally Hoarse voice  Eyes: Conjunctivae and EOM are normal.  Neck: Normal range of motion.  Cardiovascular: Normal rate, regular rhythm, normal heart sounds and intact distal pulses.   Pulmonary/Chest: Effort normal and breath sounds normal.  Congestion clears with cough  Musculoskeletal: Normal range of motion.  Lymphadenopathy:    She has no cervical adenopathy.  Neurological: She is alert and oriented to person, place, and time.  Skin: Skin is warm and dry.  Psychiatric: She has a normal mood and affect. Judgment normal.          Assessment & Plan:  Cough probable bronchitis/ Allergic rhinitis- Allegra OTC, increase H2o, allergy hygiene explained. Warm salt water gargles daily. 1 tsp liquid benadryl + 1 tsp liquid Maalox, MIX/ GARGLE/ SPIT as needed for pain. Medrol 4mg  AD, Tessalon Perles 200 AD, Astelin NS AD  w/c if SX increase or ER.

## 2014-03-29 ENCOUNTER — Ambulatory Visit (INDEPENDENT_AMBULATORY_CARE_PROVIDER_SITE_OTHER): Payer: Medicare Other | Admitting: Emergency Medicine

## 2014-03-29 ENCOUNTER — Encounter: Payer: Self-pay | Admitting: Emergency Medicine

## 2014-03-29 VITALS — BP 124/70 | HR 86 | Temp 98.4°F | Resp 16 | Ht 65.0 in | Wt 148.0 lb

## 2014-03-29 DIAGNOSIS — R5383 Other fatigue: Secondary | ICD-10-CM

## 2014-03-29 DIAGNOSIS — R7309 Other abnormal glucose: Secondary | ICD-10-CM

## 2014-03-29 DIAGNOSIS — Z111 Encounter for screening for respiratory tuberculosis: Secondary | ICD-10-CM

## 2014-03-29 DIAGNOSIS — Z1331 Encounter for screening for depression: Secondary | ICD-10-CM

## 2014-03-29 DIAGNOSIS — Z789 Other specified health status: Secondary | ICD-10-CM

## 2014-03-29 DIAGNOSIS — R059 Cough, unspecified: Secondary | ICD-10-CM

## 2014-03-29 DIAGNOSIS — I10 Essential (primary) hypertension: Secondary | ICD-10-CM

## 2014-03-29 DIAGNOSIS — Z23 Encounter for immunization: Secondary | ICD-10-CM

## 2014-03-29 DIAGNOSIS — R05 Cough: Secondary | ICD-10-CM

## 2014-03-29 DIAGNOSIS — R5381 Other malaise: Secondary | ICD-10-CM

## 2014-03-29 DIAGNOSIS — Z0000 Encounter for general adult medical examination without abnormal findings: Secondary | ICD-10-CM

## 2014-03-29 DIAGNOSIS — Z1212 Encounter for screening for malignant neoplasm of rectum: Secondary | ICD-10-CM

## 2014-03-29 DIAGNOSIS — E559 Vitamin D deficiency, unspecified: Secondary | ICD-10-CM

## 2014-03-29 DIAGNOSIS — E782 Mixed hyperlipidemia: Secondary | ICD-10-CM

## 2014-03-29 LAB — CBC WITH DIFFERENTIAL/PLATELET
Basophils Absolute: 0 10*3/uL (ref 0.0–0.1)
Basophils Relative: 0 % (ref 0–1)
Eosinophils Absolute: 0.2 10*3/uL (ref 0.0–0.7)
Eosinophils Relative: 3 % (ref 0–5)
HEMATOCRIT: 43.7 % (ref 36.0–46.0)
HEMOGLOBIN: 14.8 g/dL (ref 12.0–15.0)
LYMPHS PCT: 42 % (ref 12–46)
Lymphs Abs: 2.2 10*3/uL (ref 0.7–4.0)
MCH: 28.7 pg (ref 26.0–34.0)
MCHC: 33.9 g/dL (ref 30.0–36.0)
MCV: 84.7 fL (ref 78.0–100.0)
MONO ABS: 0.6 10*3/uL (ref 0.1–1.0)
Monocytes Relative: 12 % (ref 3–12)
NEUTROS ABS: 2.2 10*3/uL (ref 1.7–7.7)
Neutrophils Relative %: 43 % (ref 43–77)
Platelets: 212 10*3/uL (ref 150–400)
RBC: 5.16 MIL/uL — ABNORMAL HIGH (ref 3.87–5.11)
RDW: 15.1 % (ref 11.5–15.5)
WBC: 5.2 10*3/uL (ref 4.0–10.5)

## 2014-03-29 MED ORDER — BENZONATATE 100 MG PO CAPS: 100.0000 mg | ORAL_CAPSULE | Freq: Three times a day (TID) | ORAL | Status: DC | PRN

## 2014-03-29 NOTE — Patient Instructions (Addendum)
Pneumococcal Vaccine, Polyvalent solution for injection What is this medicine? PNEUMOCOCCAL VACCINE, POLYVALENT (NEU mo KOK al vak SEEN, pol ee VEY luhnt) is a vaccine to prevent pneumococcus bacteria infection. These bacteria are a major cause of ear infections, Strep throat infections, and serious pneumonia, meningitis, or blood infections worldwide. These vaccines help the body to produce antibodies (protective substances) that help your body defend against these bacteria. This vaccine is recommended for people 78 years of age and older with health problems. It is also recommended for all adults over 78 years old. This vaccine will not treat an infection. This medicine may be used for other purposes; ask your health care provider or pharmacist if you have questions. COMMON BRAND NAME(S): Pneumovax 23 What should I tell my health care provider before I take this medicine? They need to know if you have any of these conditions: -bleeding problems -bone marrow or organ transplant -cancer, Hodgkin's disease -fever -infection -immune system problems -low platelet count in the blood -seizures -an unusual or allergic reaction to pneumococcal vaccine, diphtheria toxoid, other vaccines, latex, other medicines, foods, dyes, or preservatives -pregnant or trying to get pregnant -breast-feeding How should I use this medicine? This vaccine is for injection into a muscle or under the skin. It is given by a health care professional. A copy of Vaccine Information Statements will be given before each vaccination. Read this sheet carefully each time. The sheet may change frequently. Talk to your pediatrician regarding the use of this medicine in children. While this drug may be prescribed for children as young as 78 years of age for selected conditions, precautions do apply. Overdosage: If you think you have taken too much of this medicine contact a poison control center or emergency room at once. NOTE: This  medicine is only for you. Do not share this medicine with others. What if I miss a dose? It is important not to miss your dose. Call your doctor or health care professional if you are unable to keep an appointment. What may interact with this medicine? -medicines for cancer chemotherapy -medicines that suppress your immune function -medicines that treat or prevent blood clots like warfarin, enoxaparin, and dalteparin -steroid medicines like prednisone or cortisone This list may not describe all possible interactions. Give your health care provider a list of all the medicines, herbs, non-prescription drugs, or dietary supplements you use. Also tell them if you smoke, drink alcohol, or use illegal drugs. Some items may interact with your medicine. What should I watch for while using this medicine? Mild fever and pain should go away in 3 days or less. Report any unusual symptoms to your doctor or health care professional. What side effects may I notice from receiving this medicine? Side effects that you should report to your doctor or health care professional as soon as possible: -allergic reactions like skin rash, itching or hives, swelling of the face, lips, or tongue -breathing problems -confused -fever over 102 degrees F -pain, tingling, numbness in the hands or feet -seizures -unusual bleeding or bruising -unusual muscle weakness Side effects that usually do not require medical attention (report to your doctor or health care professional if they continue or are bothersome): -aches and pains -diarrhea -fever of 102 degrees F or less -headache -irritable -loss of appetite -pain, tender at site where injected -trouble sleeping This list may not describe all possible side effects. Call your doctor for medical advice about side effects. You may report side effects to FDA at 1-800-FDA-1088. Where should  I keep my medicine? This does not apply. This vaccine is given in a clinic, pharmacy,  doctor's office, or other health care setting and will not be stored at home. NOTE: This sheet is a summary. It may not cover all possible information. If you have questions about this medicine, talk to your doctor, pharmacist, or health care provider.  2014, Elsevier/Gold Standard. (2008-07-01 14:32:37) Allergic Rhinitis Allergic rhinitis is when the mucous membranes in the nose respond to allergens. Allergens are particles in the air that cause your body to have an allergic reaction. This causes you to release allergic antibodies. Through a chain of events, these eventually cause you to release histamine into the blood stream. Although meant to protect the body, it is this release of histamine that causes your discomfort, such as frequent sneezing, congestion, and an itchy, runny nose.  CAUSES  Seasonal allergic rhinitis (hay fever) is caused by pollen allergens that may come from grasses, trees, and weeds. Year-round allergic rhinitis (perennial allergic rhinitis) is caused by allergens such as house dust mites, pet dander, and mold spores.  SYMPTOMS   Nasal stuffiness (congestion).  Itchy, runny nose with sneezing and tearing of the eyes. DIAGNOSIS  Your health care provider can help you determine the allergen or allergens that trigger your symptoms. If you and your health care provider are unable to determine the allergen, skin or blood testing may be used. TREATMENT  Allergic Rhinitis does not have a cure, but it can be controlled by:  Medicines and allergy shots (immunotherapy).  Avoiding the allergen. Hay fever may often be treated with antihistamines in pill or nasal spray forms. Antihistamines block the effects of histamine. There are over-the-counter medicines that may help with nasal congestion and swelling around the eyes. Check with your health care provider before taking or giving this medicine.  If avoiding the allergen or the medicine prescribed do not work, there are many new  medicines your health care provider can prescribe. Stronger medicine may be used if initial measures are ineffective. Desensitizing injections can be used if medicine and avoidance does not work. Desensitization is when a patient is given ongoing shots until the body becomes less sensitive to the allergen. Make sure you follow up with your health care provider if problems continue. HOME CARE INSTRUCTIONS It is not possible to completely avoid allergens, but you can reduce your symptoms by taking steps to limit your exposure to them. It helps to know exactly what you are allergic to so that you can avoid your specific triggers. SEEK MEDICAL CARE IF:   You have a fever.  You develop a cough that does not stop easily (persistent).  You have shortness of breath.  You start wheezing.  Symptoms interfere with normal daily activities. Document Released: 08/20/2001 Document Revised: 09/15/2013 Document Reviewed: 08/02/2013 Exit[employer] Patient Information 2014 [employer].  

## 2014-03-29 NOTE — Progress Notes (Signed)
Patient ID: Monica Curry, female   DOB: 18-Feb-1931, 80 y.o.   MRN: [id number] Subjective:   Monica Curry is a 80 y.o. female who presents for Medicare Annual Wellness Visit and complete physical.    Date of last medicare wellness visit is unknown.  She has increased allergy symptoms x 1 week with clear allergy drainage. She has been taking allegra.SHe has had dry cough.   Her blood pressure has been controlled at home, today [name] is BP: 124/70 mmHg She does workout. She denies chest pain, shortness of breath, dizziness.  She is not on cholesterol medication and denies myalgias. Her cholesterol is at goal. The cholesterol last visit was:   Lab Results  Component Value Date   CHOL 173 03/29/2014   She has been working on diet and exercise for prediabetes, and denies foot ulcerations and visual disturbances. Last A1C in the office was:  Lab Results  Component Value Date   HGBA1C 5.7* 03/29/2014   Patient is on Vitamin D supplement.     Names of Other Physician/Practitioners you currently use: Patient Care Team: Unk Pinto, MD as PCP - General (Internal Medicine) Cleotis Nipper, MD as Consulting Physician (Gastroenterology) Fay Records, MD as Consulting Physician (Cardiology) Selinda Orion, MD as Consulting Physician (Dermatology) Eulis Manly. Gershon Crane, MD as Consulting Physician (Ophthalmology) Kathee Delton, MD as Consulting Physician (Pulmonary Disease) Anastasio Auerbach, MD as Consulting Physician (Gynecology) Lennon Alstrom, MD as Consulting Physician (Neurology) [name], MD as Attending Physician (Neurology)   Medication Review Current Outpatient Prescriptions on File Prior to Visit  Medication Sig Dispense Refill  . aspirin 81 MG tablet Take 81 mg by mouth daily.      . Cholecalciferol (VITAMIN D PO) Take 2,000 Units by mouth 2 (two) times daily.       . clonazePAM (KLONOPIN) 0.5 MG tablet Take 1 pill in AM, 1/2 pill at noon and 1/2 pill at night.  60 tablet  5   . propranolol (INDERAL) 20 MG tablet Take 1 tablet (20 mg total) by mouth 3 (three) times daily.  90 tablet  5  . Risedronate Sodium (ATELVIA) 35 MG TBEC Take 1 tablet (35 mg total) by mouth once a week.  4 tablet  6   No current facility-administered medications on file prior to visit.   Allergies  Allergen Reactions  . Codeine   . Mysoline [Primidone]   . Pneumovax 23 [Pneumococcal Vac Polyvalent]     Red, raised area.  Knot at site of injection     Current Problems (verified) Patient Active Problem List   Diagnosis Date Noted  . Unspecified essential hypertension 11/25/2013  . Mixed hyperlipidemia 11/25/2013  . Other abnormal glucose 11/25/2013  . Unspecified vitamin D deficiency 11/25/2013  . Essential tremor 11/25/2013  . Personal history of malignant neoplasm of other parts of uterus   . Cancer   . Personal history of venous thrombosis and embolism   . Urethral prolapse   . Osteoporosis 10/10/2011    Screening Tests Health Maintenance  Topic Date Due  . Colonoscopy  07/31/1983  . Influenza Vaccine  07/09/2014  . Tetanus/tdap  03/24/2023  . Pneumococcal Polysaccharide Vaccine Age 44 And Over  Completed  . Zostavax  Completed    Immunization History  Administered Date(s) Administered  . H1N1 01/03/2009  . Influenza Whole 09/23/2013  . PPD Test 03/29/2014  . Pneumococcal Polysaccharide-23 03/29/2014  . Pneumococcal-Unspecified 12/09/2002  . Td 03/23/2013  . Zoster 12/09/2006  Preventative care: Last colonoscopy: 2011 due 2016 Last mammogram: 10/11/13  Last pap smear/pelvic exam: 10/21/13   DEXA:11/08/13 EYE:12/10/13 cataract stable Dentist- Q 6 month  Prior vaccinations: TD or Tdap: 03/23/13  Influenza: 09/23/13 Pneumococcal: 2004 Shingles/Zostavax: 2008  History reviewed:  Past Medical History  Diagnosis Date  . Personal history of malignant neoplasm of other parts of uterus 1997  . Personal history of venous thrombosis and embolism 1997  .  Essential and other specified forms of tremor   . Cancer     Endometrial cancer  . Osteoporosis 11/2013    T score -2.4 prior in 2000 -3.3, T9 compression fracture indeterminate duration  . Urethral prolapse    Past Surgical History  Procedure Laterality Date  . Cesarean section  1961  . Abdominal hysterectomy  1997  . Tonsilectomy/adenoidectomy with myringotomy  1944  . Oophorectomy      BSO   History  Substance Use Topics  . Smoking status: Never Smoker   . Smokeless tobacco: Not on file  . Alcohol Use: 0.0 oz/week     Comment: ocassionally   Family History  Problem Relation Age of Onset  . Cancer Mother   . Cancer Father     Bone  . Cancer Brother     Prostate  . Cancer Brother     Prostate  . Heart disease Brother   . Heart disease Brother   . Heart disease Sister   . Heart disease Sister   . Diabetes Sister    Risk Factors: Osteoporosis: postmenopausal estrogen deficiency History of fracture in the past year: no  Tobacco History  Substance Use Topics  . Smoking status: Never Smoker   . Smokeless tobacco: Not on file  . Alcohol Use: 0.0 oz/week     Comment: ocassionally   She does not smoke.  Patient is not a former smoker. Are there smokers in your home (other than you)?  No  Alcohol Current alcohol use: none  Caffeine Current caffeine use: denies use  Exercise Current exercise habits: The patient does not participate in regular exercise at present.  Current exercise: housecleaning  Nutrition/Diet Current diet: in general, a "healthy" diet    Cardiac risk factors: advanced age (older than 53 for men, 35 for women) and sedentary lifestyle.  Depression Screen (Note: if answer to either of the following is "Yes", a more complete depression screening is indicated)   Q1: Over the past two weeks, have you felt down, depressed or hopeless? No  Q2: Over the past two weeks, have you felt little interest or pleasure in doing things? No  Have you lost  interest or pleasure in daily life? No  Do you often feel hopeless? No  Do you cry easily over simple problems? No  Activities of Daily Living In your present state of health, do you have any difficulty performing the following activities?:  Driving? No Managing money?  No Feeding yourself? No Getting from bed to chair? No Climbing a flight of stairs? No Preparing food and eating?: No Bathing or showering? No Getting dressed: No Getting to the toilet? No Using the toilet:No Moving around from place to place: No In the past year have you fallen or had a near fall?:No   Are you sexually active?  No  Do you have more than one partner?  No  Vision Difficulties: No  Hearing Difficulties: No Do you often ask people to speak up or repeat themselves? No Do you experience ringing or noises in your  ears? No Do you have difficulty understanding soft or whispered voices? No  Cognition  Do you feel that you have a problem with memory?No  Do you often misplace items? No  Do you feel safe at home?  No  Advanced directives Does patient have a Health Care Power of Attorney? Yes, Daughter Elberta Fortis Does patient have a Living Will? Yes    Objective:     Vision and hearing screens reviewed.   Blood pressure 124/70, pulse 86, temperature 98.4 F (36.9 C), temperature source Temporal, resp. rate 16, height 5\' 5"  (1.651 m), weight 148 lb (67.132 kg). Body mass index is 24.63 kg/(m^2).  General appearance: alert, no distress, WD/WN,  female Cognitive Testing  Alert? Yes  Normal Appearance?Yes  Oriented to person? Yes  Place? Yes   Time? Yes  Recall of three objects?  Yes  Can perform simple calculations? Yes  Displays appropriate judgment?Yes  Can read the correct time from a watch face?Yes  HEENT: normocephalic, sclerae anicteric, TMs pearly, nares patent, no discharge or erythema, pharynx normal Oral cavity: MMM, no lesions Neck: supple, no lymphadenopathy, no thyromegaly, no  masses Heart: RRR, normal S1, S2, no murmurs Lungs: CTA bilaterally, no wheezes, rhonchi, or rales Abdomen: +bs, soft, non tender, non distended, no masses, no hepatomegaly, no splenomegaly Musculoskeletal: nontender, no swelling, no obvious deformity Extremities: no edema, no cyanosis, no clubbing Skin: Exposed area WNL Pulses: 2+ symmetric, upper and lower extremities, normal cap refill Neurological: alert, oriented x 3, CN2-12 intact, strength normal upper extremities and lower extremities, sensation normal throughout, DTRs 2+ throughout, no cerebellar signs, gait normal Psychiatric: normal affect, behavior normal, pleasant  Breast: nontender, no masses or lumps, no skin changes, no nipple discharge or inversion, no axillary lymphadenopathy Gyn: GYN Rectal: GYN AORTA SCAN WNL EKG NSCSP    Assessment:  1. CPE/ medicare wellness updte- Update screening labs/ History/ Immunizations/ Testing as needed. Advised healthy diet, QD exercise, increase H20 and continue RX/ Vitamins AD.  2. 3 month F/U for HTN, Cholesterol, Pre-Dm, D. Deficient. Needs healthy diet, cardio QD and obtain healthy weight. Check Labs, Check BP if >130/80 call office  3. Fatigue- check labs, increase activity and H2O  4. Cough/ Allergic rhinitis- Allegra OTC, increase H2o, allergy hygiene explained. Avoid Delsym to see if heart rate improves.    Plan:  Se Above During the course of the visit the patient was educated and counseled about appropriate screening and preventive services including:    Pneumococcal vaccine   Screening recommendations, referrals:  Vaccinations: ALL FOLLOWING UP TO DATE OR DECLINES  Tdap vaccine not indicated Influenza vaccine not indicated Pneumococcal vaccine ordered Shingles vaccine not indicated Hep B vaccine declined  Nutrition assessed and recommended  Colonoscopy not indicated Mammogram not indicated Pap smear not indicated Pelvic exam not indicated Recommended yearly  ophthalmology/optometry visit for glaucoma screening and checkup Recommended yearly dental visit for hygiene and checkup Advanced directives - declined  Conditions/risks identified: BMI: Discussed weight loss, diet, and increase physical activity.  Increase physical activity: AHA recommends 150 minutes of physical activity a week.  Medications reviewed DEXA- not indicated Diabetes at goal, ACE/ARB therapy No, Reason not on Ace Inhibitor/ARB therapy:  not diabetic Urinary Incontinence is not an issue: discussed non pharmacology and pharmacology options.  Fall risk: low- discussed PT, home fall assessment, medications.   Medicare Attestation I have personally reviewed: The patient's medical and social history Their use of alcohol, tobacco or illicit drugs Their current medications and supplements The  patient's functional ability including ADLs,fall risks, home safety risks, cognitive, and hearing and visual impairment Diet and physical activities Evidence for depression or mood disorders  The patient's weight, height, BMI, and visual acuity have been recorded in the chart.  I have made referrals, counseling, and provided education to the patient based on review of the above and I have provided the patient with a written personalized care plan for preventive services.     Ardis Hughs, PA-C   04/04/2014   Other referrals - social services, PT, meals, transportation, nutrition therapy, weight loss, exercise, falls, tobacco, pap, pelvicLETE CPT G0438 first AWV CPT [phone] subsequent AWV

## 2014-03-30 ENCOUNTER — Ambulatory Visit (INDEPENDENT_AMBULATORY_CARE_PROVIDER_SITE_OTHER): Payer: Medicare Other | Admitting: Emergency Medicine

## 2014-03-30 VITALS — BP 128/72 | HR 100 | Temp 100.6°F | Resp 18 | Ht 65.0 in | Wt 148.0 lb

## 2014-03-30 DIAGNOSIS — L0390 Cellulitis, unspecified: Secondary | ICD-10-CM

## 2014-03-30 DIAGNOSIS — L0291 Cutaneous abscess, unspecified: Secondary | ICD-10-CM

## 2014-03-30 DIAGNOSIS — T7840XA Allergy, unspecified, initial encounter: Secondary | ICD-10-CM

## 2014-03-30 LAB — BASIC METABOLIC PANEL WITH GFR
BUN: 18 mg/dL (ref 6–23)
CO2: 30 mEq/L (ref 19–32)
Calcium: 9.7 mg/dL (ref 8.4–10.5)
Chloride: 97 mEq/L (ref 96–112)
Creat: 1.03 mg/dL (ref 0.50–1.10)
GFR, Est African American: 59 mL/min — ABNORMAL LOW
GFR, Est Non African American: 51 mL/min — ABNORMAL LOW
GLUCOSE: 88 mg/dL (ref 70–99)
POTASSIUM: 4.3 meq/L (ref 3.5–5.3)
Sodium: 135 mEq/L (ref 135–145)

## 2014-03-30 LAB — MICROALBUMIN / CREATININE URINE RATIO
CREATININE, URINE: 70.6 mg/dL
MICROALB UR: 0.5 mg/dL (ref 0.00–1.89)
Microalb Creat Ratio: 7.1 mg/g (ref 0.0–30.0)

## 2014-03-30 LAB — URINALYSIS, ROUTINE W REFLEX MICROSCOPIC
Bilirubin Urine: NEGATIVE
Glucose, UA: NEGATIVE mg/dL
Hgb urine dipstick: NEGATIVE
KETONES UR: NEGATIVE mg/dL
Leukocytes, UA: NEGATIVE
Nitrite: NEGATIVE
PROTEIN: NEGATIVE mg/dL
Specific Gravity, Urine: 1.01 (ref 1.005–1.030)
UROBILINOGEN UA: 0.2 mg/dL (ref 0.0–1.0)
pH: 6 (ref 5.0–8.0)

## 2014-03-30 LAB — LIPID PANEL
Cholesterol: 173 mg/dL (ref 0–200)
HDL: 43 mg/dL (ref >39)
LDL Cholesterol: 103 mg/dL — ABNORMAL HIGH (ref 0–99)
Total CHOL/HDL Ratio: 4 Ratio
Triglycerides: 137 mg/dL (ref <150)
VLDL: 27 mg/dL (ref 0–40)

## 2014-03-30 LAB — VITAMIN D 25 HYDROXY (VIT D DEFICIENCY, FRACTURES): Vit D, 25-Hydroxy: 64 ng/mL (ref 30–89)

## 2014-03-30 LAB — HEPATIC FUNCTION PANEL
ALBUMIN: 4.4 g/dL (ref 3.5–5.2)
ALT: 17 U/L (ref 0–35)
AST: 21 U/L (ref 0–37)
Alkaline Phosphatase: 34 U/L — ABNORMAL LOW (ref 39–117)
BILIRUBIN TOTAL: 0.5 mg/dL (ref 0.2–1.2)
Bilirubin, Direct: 0.1 mg/dL (ref 0.0–0.3)
Indirect Bilirubin: 0.4 mg/dL (ref 0.2–1.2)
Total Protein: 7.3 g/dL (ref 6.0–8.3)

## 2014-03-30 LAB — INSULIN, FASTING: Insulin fasting, serum: 6 u[IU]/mL (ref 3–28)

## 2014-03-30 LAB — HEMOGLOBIN A1C
Hgb A1c MFr Bld: 5.7 % — ABNORMAL HIGH (ref <5.7)
MEAN PLASMA GLUCOSE: 117 mg/dL — AB (ref <117)

## 2014-03-30 LAB — TSH: TSH: 1.436 u[IU]/mL (ref 0.350–4.500)

## 2014-03-30 LAB — MAGNESIUM: MAGNESIUM: 1.9 mg/dL (ref 1.5–2.5)

## 2014-03-30 MED ORDER — CEFTRIAXONE SODIUM 1 G IJ SOLR
1.0000 g | Freq: Once | INTRAMUSCULAR | Status: AC
  Administered 2014-03-30: 1 g via INTRAMUSCULAR

## 2014-03-30 MED ORDER — DEXAMETHASONE SODIUM PHOSPHATE 10 MG/ML IJ SOLN
10.0000 mg | Freq: Once | INTRAMUSCULAR | Status: AC
  Administered 2014-03-30: 10 mg via INTRAMUSCULAR

## 2014-03-30 NOTE — Patient Instructions (Signed)
Post-Injection Inflammatory Reaction An inflammatory reaction is possible any time a needle is used to give an injection. It is called a post-injection inflammatory reaction because it happens after the needle is put through the skin. A reaction may start minutes after the injection was given, or the reaction may appear several hours after the injection was given. A reaction can last for several hours to several days. CAUSES  An injection reaction can be caused by different things. Possible causes include:  A reaction to the medicine or vaccine that was given.  An infection that occurs if germs get inside the body at the injection site. SYMPTOMS   Some symptoms may be found only at the injection site (localized reaction). These symptoms may include:  Itching.  Redness.  Warmth.  Swelling.  Tenderness.  Pain.  Some symptoms may show up in other parts of the body (systemic reaction). These symptoms may include:  Fever or chills.  Muscle aches.  Nausea.  Headache.  Dizziness. DIAGNOSIS  To determine if there is a post-injection inflammatory reaction, your caregiver may:  Do a physical exam.  Draw a circle around any redness near the injection site. This will help to show whether the redness is spreading. TREATMENT  Treatment will depend on what caused the reaction. Treatment will also vary based on how severe your reaction is. Common treatment methods include:  Putting an ice pack over the injection site.  Taking anti-inflammatory medicine, to reduce swelling and itching.  Taking an antibiotic.  Taking pain medicine. HOME CARE INSTRUCTIONS   Follow all your caregiver's instructions carefully.  Keep the injection site clean.  You may put ice on the injection site.  Put ice in a plastic bag.  Place a towel between your skin and the bag.  Leave the ice on for 15-20 minutes, 03-04 times a day.  If the reaction is in a joint, you might need to rest the joint  for a while. Ask your caregiver how active you can be.  Only take over-the-counter or prescription medicines for pain, fever, or discomfort as directed by your caregiver. Do not give aspirin to children. SEEK MEDICAL CARE IF:   You have any questions about your medicines.  Your pain, redness, warmth, swelling, or itching lasts for several hours.  You have a fever, chills, or muscle aches. SEEK IMMEDIATE MEDICAL CARE IF:  Your pain, swelling, itching, or redness gets worse.  You have trouble breathing.  Your child has a high-pitched cry or does not stop crying. Document Released: 08/07/2011 Document Revised: 02/17/2012 Document Reviewed: 08/07/2011 [facility] Patient Information 2014 [place], [place]. Cellulitis Cellulitis is an infection of the skin and the tissue under the skin. The infected area is usually red and tender. This happens most often in the arms and lower legs. HOME CARE   Take your antibiotic medicine as told. Finish the medicine even if you start to feel better.  Keep the infected arm or leg raised (elevated).  Put a warm cloth on the area up to 4 times per day.  Only take medicines as told by your doctor.  Keep all doctor visits as told. GET HELP RIGHT AWAY IF:   You have a fever.  You feel very sleepy.  You throw up (vomit) or have watery poop (diarrhea).  You feel sick and have muscle aches and pains.  You see red streaks on the skin coming from the infected area.  Your red area gets bigger or turns a dark color.  Your bone  or joint under the infected area is painful after the skin heals.  Your infection comes back in the same area or different area.  You have a puffy (swollen) bump in the infected area.  You have new symptoms. MAKE SURE YOU:   Understand these instructions.  Will watch your condition.  Will get help right away if you are not doing well or get worse. Document Released: 05/13/2008 Document Revised: 05/26/2012 Document  Reviewed: 02/10/2012 [facility] Patient Information 2014 [place], [place].

## 2014-03-31 ENCOUNTER — Encounter: Payer: Self-pay | Admitting: Emergency Medicine

## 2014-03-31 NOTE — Progress Notes (Signed)
   Subjective:    Patient ID: Monica Curry, female    DOB: 03-10-1931, 80 y.o.   MRN: [id number]  HPI Comments: 80 yo WF was given Pneumovax at CPE yesterday and has had redness/ pain/ edema at injection site and fever of 100 today despite Tylenol every 8 hours.   Fever   Allergic Reaction      Review of Systems  Constitutional: Positive for fever.  Skin: Positive for color change and wound.  All other systems reviewed and are negative.      Objective:   Physical Exam  Nursing note and vitals reviewed. Constitutional: She is oriented to person, place, and time. She appears well-developed and well-nourished.  HENT:  Head: Normocephalic and atraumatic.  Right Ear: External ear normal.  Left Ear: External ear normal.  Nose: Nose normal.  Mouth/Throat: Oropharynx is clear and moist.  Eyes: Conjunctivae and EOM are normal.  Neck: Normal range of motion.  Cardiovascular: Normal rate, regular rhythm, normal heart sounds and intact distal pulses.   Pulmonary/Chest: Effort normal and breath sounds normal.  Musculoskeletal: Normal range of motion.  Lymphadenopathy:    She has no cervical adenopathy.  Neurological: She is alert and oriented to person, place, and time.  Skin: Skin is warm and dry. Rash noted. There is erythema.  Left upper arm 50 cent size warm , red mildly elevated area at injection site  Psychiatric: She has a normal mood and affect. Judgment normal.          Assessment & Plan:  1. ? Cellulitis reaction to Pneumovax- Rocephin 1 gm, Dexamethasone 10 mg. Call Friday with results. Warm wet compresses to area. Push fluids, continue tylenol Q 8 hours. w/c if SX increase or ER.

## 2014-04-01 LAB — TB SKIN TEST
Induration: 0 mm
TB SKIN TEST: NEGATIVE

## 2014-06-09 ENCOUNTER — Other Ambulatory Visit (INDEPENDENT_AMBULATORY_CARE_PROVIDER_SITE_OTHER): Payer: Medicare Other

## 2014-06-09 DIAGNOSIS — Z1212 Encounter for screening for malignant neoplasm of rectum: Secondary | ICD-10-CM

## 2014-06-09 LAB — POC HEMOCCULT BLD/STL (HOME/3-CARD/SCREEN)
Card #3 Fecal Occult Blood, POC: NEGATIVE
FECAL OCCULT BLD: NEGATIVE
Fecal Occult Blood, POC: NEGATIVE

## 2014-06-30 ENCOUNTER — Encounter: Payer: Self-pay | Admitting: Neurology

## 2014-06-30 ENCOUNTER — Ambulatory Visit (INDEPENDENT_AMBULATORY_CARE_PROVIDER_SITE_OTHER): Payer: Medicare Other | Admitting: Neurology

## 2014-06-30 VITALS — BP 130/78 | HR 56 | Temp 98.3°F | Ht 65.0 in | Wt 145.0 lb

## 2014-06-30 DIAGNOSIS — G250 Essential tremor: Secondary | ICD-10-CM

## 2014-06-30 DIAGNOSIS — G252 Other specified forms of tremor: Secondary | ICD-10-CM

## 2014-06-30 DIAGNOSIS — F411 Generalized anxiety disorder: Secondary | ICD-10-CM

## 2014-06-30 DIAGNOSIS — F419 Anxiety disorder, unspecified: Secondary | ICD-10-CM

## 2014-06-30 DIAGNOSIS — G4700 Insomnia, unspecified: Secondary | ICD-10-CM

## 2014-06-30 MED ORDER — PROPRANOLOL HCL 20 MG PO TABS: 20.0000 mg | ORAL_TABLET | Freq: Three times a day (TID) | ORAL | Status: DC

## 2014-06-30 MED ORDER — CLONAZEPAM 0.5 MG PO TABS: ORAL_TABLET | ORAL | Status: DC

## 2014-06-30 NOTE — Patient Instructions (Addendum)
We will continue with the propranolol and the clonazepam for your essential tremor.   For your sleep, you can try Melatonin at night for sleep: take 3 to 5 mg one hour to 2 hours before your bedtime.

## 2014-06-30 NOTE — Progress Notes (Signed)
Subjective:    Patient ID: Monica Curry is a 80 y.o. female.  HPI    Interim history:   Monica Curry is a very pleasant 80 year old right-handed woman with an underlying medical history of uterine cancer, anxiety and PE. She is status post hysterectomy and tonsillectomy, who presents for followup consultation of her essential tremor. She is unaccompanied today. I last saw her on 12/31/2013, at which time I felt she was stable. I did not change her medications. I suggested a routine six-month checkup. She reported volunteering at the [facility]. She felt stable. She reported having a cousin with Parkinson's disease and one aunt with voice tremor. She had a bone density scan on 10/14/13 and it showed stable findings.   Today, she reports, that from the tremor standpoint, she is doing fairly well. She has had more trouble going to sleep and staying asleep. She goes to bed around MN and sometimes she is still up by 3 AM. She has always been a good sleeper. Her brother had heart surgery and is in the hospital.   I first met her on 07/02/2013, at which time I felt her exam was stable and it did not change her medications. I had reviewed recent blood work and did not have any new test at the time. I renewed her prescriptions for propranolol and clonazepam at the time.  She previously followed with Dr. Morene Antu and last saw him on 12/31/2012, and which time he started her on propranolol.  She felt the propranolol 60 mg long-acting once daily was not as effective and was switched to propranolol 20 mg tid, clonazepam 0.5 mg tablet one in the morning, 0.25 mg at noon and 0.25 mg in the evening. Activity makes the tremor worse. She does note an improvement with alcohol and takes only one propranolol when she has had a drink. She drinks occasionally.  She has a history of tremors for many years, treated with clonazepam for a number of years, but tremor exacerbated in 1997 after her pulmonary embolus.  She had withdrawal symptoms coming off of clonazepam. She had been on Mysoline but did not tolerate it. She has seen several other doctors for the tremor. TSH was negative in the past. She was tried on methazolamide without benefit. X ray lumbar spine from 03/24/13 showed T9 compression fracture, age indeterminate.   Her Past Medical History Is Significant For: Past Medical History  Diagnosis Date  . Personal history of malignant neoplasm of other parts of uterus 1997  . Personal history of venous thrombosis and embolism 1997  . Essential and other specified forms of tremor   . Cancer     Endometrial cancer  . Osteoporosis 11/2013    T score -2.4 prior in 2000 -3.3, T9 compression fracture indeterminate duration  . Urethral prolapse     Her Past Surgical History Is Significant For: Past Surgical History  Procedure Laterality Date  . Cesarean section  1961  . Abdominal hysterectomy  1997  . Tonsilectomy/adenoidectomy with myringotomy  1944  . Oophorectomy      BSO    Her Family History Is Significant For: Family History  Problem Relation Age of Onset  . Cancer Mother   . Cancer Father     Bone  . Cancer Brother     Prostate  . Cancer Brother     Prostate  . Heart disease Brother   . Heart disease Brother   . Heart disease Sister   . Heart disease Sister   .  Diabetes Sister     Her Social History Is Significant For: History   Social History  . Marital Status: Widowed    Spouse Name: N/A    Number of Children: N/A  . Years of Education: N/A   Social History Main Topics  . Smoking status: Never Smoker   . Smokeless tobacco: None  . Alcohol Use: 0.0 oz/week     Comment: ocassionally  . Drug Use: No  . Sexual Activity: No     Comment: HYST   Other Topics Concern  . None   Social History Narrative  . None    Her Allergies Are:  Allergies  Allergen Reactions  . Codeine   . Mysoline [Primidone]   . Pneumovax 23 [Pneumococcal Vac Polyvalent]     Red,  raised area.  Knot at site of injection  :   Her Current Medications Are:  Outpatient Encounter Prescriptions as of 06/30/2014  Medication Sig  . aspirin 81 MG tablet Take 81 mg by mouth daily.  . Calcium-Magnesium-Vitamin D [phone] MG-MG-UNIT TABS Take by mouth daily.  . Cholecalciferol (VITAMIN D PO) Take 2,000 Units by mouth 2 (two) times daily.   . clonazePAM (KLONOPIN) 0.5 MG tablet Take 1 pill in AM, 1/2 pill at noon and 1/2 pill at night.  . Magnesium 250 MG TABS Take 1 tablet by mouth daily.  . propranolol (INDERAL) 20 MG tablet Take 1 tablet (20 mg total) by mouth 3 (three) times daily.  . Risedronate Sodium (ATELVIA) 35 MG TBEC Take 1 tablet (35 mg total) by mouth once a week.  . [DISCONTINUED] benzonatate (TESSALON PERLES) 100 MG capsule Take 1 capsule (100 mg total) by mouth 3 (three) times daily as needed for cough.  :  Review of Systems:  Out of a complete 14 point review of systems, all are reviewed and negative with the exception of these symptoms as listed below:   Review of Systems  Constitutional: Negative.   HENT: Negative.   Eyes: Negative.   Respiratory: Negative.   Cardiovascular: Negative.   Gastrointestinal: Negative.   Endocrine: Negative.   Genitourinary: Negative.   Musculoskeletal: Negative.   Skin: Negative.   Allergic/Immunologic: Negative.   Neurological: Positive for tremors.  Hematological: Negative.   Psychiatric/Behavioral: Positive for sleep disturbance (restless leg, insomnia, eds). The patient is nervous/anxious.     Objective:  Neurologic Exam  Physical Exam Physical Examination:   Filed Vitals:   06/30/14 1158  BP: 130/78  Pulse: 56  Temp: 98.3 F (36.8 C)   General Examination: The patient is a very pleasant 80 y.o. female in no acute distress. She appears well-developed and well-nourished and very well groomed. She is mildly anxious appearing.    HEENT: Normocephalic, atraumatic, pupils are equal, round and reactive to  light and accommodation. Funduscopic exam was normal. Extraocular tracking is good without limitation to gaze excursion or nystagmus noted. Normal smooth pursuit is noted. Hearing is grossly intact. Face is symmetric with normal facial animation and normal facial sensation. Speech is clear with no dysarthria noted. There is no hypophonia. There is a mild lip, neck/head, and jaw tremor and a minimal voice tremor, unchanged. Neck is supple with full range of passive and active motion. There are no carotid bruits on auscultation. Oropharynx exam reveals: mild mouth dryness, adequate dental hygiene and mild airway crowding. Mallampati is class II. Tongue protrudes centrally and palate elevates symmetrically.    Chest: Clear to auscultation without wheezing, rhonchi or crackles noted.  Heart: S1+S2+0,  regular and normal without murmurs, rubs or gallops noted.   Abdomen: Soft, non-tender and non-distended with normal bowel sounds appreciated on auscultation.  Extremities: There is a touch of puffiness around the ankles bilaterally. Pedal pulses are intact.  Skin: Warm and dry without trophic changes noted. There are no varicose veins.   Musculoskeletal: exam reveals no obvious joint deformities, tenderness or joint swelling or erythema.   Neurologically:  Mental status: The patient is awake, alert and oriented in all 4 spheres. Her memory, attention, language and knowledge are appropriate. There is no aphasia, agnosia, apraxia or anomia. Speech is clear with normal prosody and enunciation. Thought process is linear. Mood is congruent and affect is normal.  Cranial nerves are as described above under HEENT exam. In addition, shoulder shrug is normal with equal shoulder height noted. Motor exam: Normal bulk, strength and tone is noted. There is no drift, or rebound. She has slight postural and action tremor in her hands, which is intermittent. Mildly tremulous handwriting, but legible. Romberg is negative.  Reflexes are 2+ throughout. Fine motor skills are intact with normal finger taps, normal hand movements, normal rapid alternating patting, normal foot taps and normal foot agility.  Cerebellar testing shows no dysmetria or intention tremor on finger to nose testing. There is no truncal or gait ataxia.  Sensory exam is intact to light touch, pinprick, vibration, temperature sense in the upper and lower extremities.  Gait, station and balance are unremarkable. No veering to one side is noted. No leaning to one side is noted. Posture is age-appropriate and stance is narrow based. No problems turning are noted. She turns en bloc. Tandem walk is unremarkable for age.   Assessment and Plan:   In summary, Monica Curry is a very pleasant 80 year old female with a history of ET. Her physical exam is again noted to be stable and I reassured the patient in that regard. She is complaining about insomnia. I recommended OTC Melatonin for this. We talked about maintaining a healthy lifestyle in general. I encouraged the patient to eat healthy, exercise daily and keep well hydrated, to keep a scheduled bedtime and wake time routine, to not skip any meals and eat healthy snacks in between meals and to have protein with every meal. She is encouraged to drink more water.   As far as further diagnostic testing is concerned, I suggested the following today: no new test needed.   As far as medications are concerned, I recommended the following at this time: no change. I renewed her prescriptions for propranolol and clonazepam.  I answered all her questions today and the patient was in agreement with the above outlined plan. I would like to see the patient back in 6 months, sooner if the need arises and encouraged her to call with any interim questions, concerns, problems or updates and refill requests.

## 2014-07-04 ENCOUNTER — Other Ambulatory Visit: Payer: Self-pay | Admitting: Internal Medicine

## 2014-07-04 ENCOUNTER — Ambulatory Visit (INDEPENDENT_AMBULATORY_CARE_PROVIDER_SITE_OTHER): Payer: Medicare Other | Admitting: Internal Medicine

## 2014-07-04 ENCOUNTER — Encounter: Payer: Self-pay | Admitting: Internal Medicine

## 2014-07-04 VITALS — BP 124/78 | HR 64 | Temp 97.3°F | Resp 16 | Ht 64.75 in | Wt 147.0 lb

## 2014-07-04 DIAGNOSIS — E559 Vitamin D deficiency, unspecified: Secondary | ICD-10-CM

## 2014-07-04 DIAGNOSIS — R7309 Other abnormal glucose: Secondary | ICD-10-CM

## 2014-07-04 DIAGNOSIS — Z79899 Other long term (current) drug therapy: Secondary | ICD-10-CM | POA: Insufficient documentation

## 2014-07-04 DIAGNOSIS — Z1212 Encounter for screening for malignant neoplasm of rectum: Secondary | ICD-10-CM

## 2014-07-04 DIAGNOSIS — E782 Mixed hyperlipidemia: Secondary | ICD-10-CM

## 2014-07-04 DIAGNOSIS — I10 Essential (primary) hypertension: Secondary | ICD-10-CM

## 2014-07-04 LAB — BASIC METABOLIC PANEL WITH GFR
BUN: 24 mg/dL — ABNORMAL HIGH (ref 6–23)
CALCIUM: 9.5 mg/dL (ref 8.4–10.5)
CO2: 27 mEq/L (ref 19–32)
Chloride: 104 mEq/L (ref 96–112)
Creat: 1.13 mg/dL — ABNORMAL HIGH (ref 0.50–1.10)
GFR, Est African American: 53 mL/min — ABNORMAL LOW
GFR, Est Non African American: 46 mL/min — ABNORMAL LOW
Glucose, Bld: 98 mg/dL (ref 70–99)
Potassium: 4.6 mEq/L (ref 3.5–5.3)
Sodium: 138 mEq/L (ref 135–145)

## 2014-07-04 LAB — HEMOGLOBIN A1C
Hgb A1c MFr Bld: 6 % — ABNORMAL HIGH (ref <5.7)
Mean Plasma Glucose: 126 mg/dL — ABNORMAL HIGH (ref <117)

## 2014-07-04 LAB — CBC WITH DIFFERENTIAL/PLATELET
Basophils Absolute: 0 10*3/uL (ref 0.0–0.1)
Basophils Relative: 1 % (ref 0–1)
EOS ABS: 0.1 10*3/uL (ref 0.0–0.7)
Eosinophils Relative: 3 % (ref 0–5)
HCT: 40.1 % (ref 36.0–46.0)
Hemoglobin: 13.6 g/dL (ref 12.0–15.0)
Lymphocytes Relative: 45 % (ref 12–46)
Lymphs Abs: 2 10*3/uL (ref 0.7–4.0)
MCH: 28.6 pg (ref 26.0–34.0)
MCHC: 33.9 g/dL (ref 30.0–36.0)
MCV: 84.2 fL (ref 78.0–100.0)
Monocytes Absolute: 0.5 10*3/uL (ref 0.1–1.0)
Monocytes Relative: 10 % (ref 3–12)
NEUTROS PCT: 41 % — AB (ref 43–77)
Neutro Abs: 1.8 10*3/uL (ref 1.7–7.7)
PLATELETS: 233 10*3/uL (ref 150–400)
RBC: 4.76 MIL/uL (ref 3.87–5.11)
RDW: 14.7 % (ref 11.5–15.5)
WBC: 4.5 10*3/uL (ref 4.0–10.5)

## 2014-07-04 LAB — LIPID PANEL
CHOL/HDL RATIO: 4.5 ratio
Cholesterol: 179 mg/dL (ref 0–200)
HDL: 40 mg/dL (ref >39)
LDL CALC: 104 mg/dL — AB (ref 0–99)
Triglycerides: 176 mg/dL — ABNORMAL HIGH (ref <150)
VLDL: 35 mg/dL (ref 0–40)

## 2014-07-04 LAB — HEPATIC FUNCTION PANEL
ALBUMIN: 4 g/dL (ref 3.5–5.2)
ALK PHOS: 25 U/L — AB (ref 39–117)
ALT: 10 U/L (ref 0–35)
AST: 14 U/L (ref 0–37)
BILIRUBIN DIRECT: 0.1 mg/dL (ref 0.0–0.3)
BILIRUBIN INDIRECT: 0.6 mg/dL (ref 0.2–1.2)
BILIRUBIN TOTAL: 0.7 mg/dL (ref 0.2–1.2)
Total Protein: 6.9 g/dL (ref 6.0–8.3)

## 2014-07-04 LAB — TSH: TSH: 1.84 u[IU]/mL (ref 0.350–4.500)

## 2014-07-04 LAB — MAGNESIUM: Magnesium: 1.9 mg/dL (ref 1.5–2.5)

## 2014-07-04 MED ORDER — GABAPENTIN 300 MG PO CAPS: ORAL_CAPSULE | ORAL | Status: DC

## 2014-07-04 MED ORDER — TRAZODONE HCL 150 MG PO TABS: ORAL_TABLET | ORAL | Status: DC

## 2014-07-04 NOTE — Patient Instructions (Signed)

## 2014-07-04 NOTE — Progress Notes (Signed)
Patient ID: Monica Curry, female   DOB: 1931/11/10, 80 y.o.   MRN: [id number]   This very nice 80 y.o.WWF presents for 3 month follow up with Hypertension, Hyperlipidemia, Pre-Diabetes and Vitamin D Deficiency.    Patient is treated for HTN & BP has been controlled at home. Today's BP: 124/78 mmHg (BP-right arm-142/82 and left arm-124/78). Patient denies any cardiac type chest pain, palpitations, dyspnea/orthopnea/PND, dizziness, claudication, or dependent edema. Patient does report so very vague query discomfort of the lower extremities at night with soreness of the shins, a soreness or aching in the calves and a numbness or tingling in the distal feet & toes. She denies any Sx's suggestive of RLS.   Hyperlipidemia is controlled with diet & meds. Patient denies myalgias or other med SE's. Last Lipids were Lab Results  Component Value Date   CHOL 173 03/29/2014   HDL 43 03/29/2014   LDLCALC 103* 03/29/2014   TRIG 137 03/29/2014   CHOLHDL 4.0 03/29/2014    Also, the patient has history of PreDiabetes and last A1c was     Lab Results  Component Value Date   HGBA1C 5.7* 03/29/2014   Patient denies any symptoms of reactive hypoglycemia, diabetic polys, paresthesias or visual blurring.   Further, Patient has history of Vitamin D Deficiency   and last vitamin D was Lab Results  Component Value Date   VD25OH 64 03/29/2014   Patient supplements vitamin D without any suspected side-effects.   Medication List   aspirin 81 MG tablet  Take 81 mg by mouth daily.     Calcium-Magnesium-Vitamin D [phone] MG-MG-UNIT Tabs  Take by mouth daily.     clonazePAM 0.5 MG tablet  Commonly known as:  KLONOPIN  Take 1 pill in AM, 1/2 pill at noon and 1/2 pill at night.     gabapentin 300 MG capsule  Commonly known as:  NEURONTIN  Take 1 tablet and 1 to 2 tablets at bedtime for pain in legs     Magnesium 250 MG Tabs  Take 1 tablet by mouth daily.     propranolol 20 MG tablet  Commonly known as:   INDERAL  Take 1 tablet (20 mg total) by mouth 3 (three) times daily.     Risedronate Sodium 35 MG Tbec  Commonly known as:  ATELVIA  Take 1 tablet (35 mg total) by mouth once a week.     traZODone 150 MG tablet  Commonly known as:  DESYREL  Take 1/2 to 1 (or 2)  Tablets 1 hour before bedtime as needed for sleep     VITAMIN D PO  Take 2,000 Units by mouth 2 (two) times daily.         Allergies  Allergen Reactions  . Codeine   . Mysoline [Primidone]   . Pneumovax 23 [Pneumococcal Vac Polyvalent]     Red, raised area.  Knot at site of injection    PMHx:   Past Medical History  Diagnosis Date  . Personal history of malignant neoplasm of other parts of uterus 1997  . Personal history of venous thrombosis and embolism 1997  . Essential and other specified forms of tremor   . Cancer     Endometrial cancer  . Osteoporosis 11/2013    T score -2.4 prior in 2000 -3.3, T9 compression fracture indeterminate duration  . Urethral prolapse     FHx:    Reviewed / unchanged SHx:    Reviewed / unchanged  Systems Review:  Constitutional: Denies fever,  chills, wt changes, headaches, insomnia, fatigue, night sweats, change in appetite. Eyes: Denies redness, blurred vision, diplopia, discharge, itchy, watery eyes.  ENT: Denies discharge, congestion, post nasal drip, epistaxis, sore throat, earache, hearing loss, dental pain, tinnitus, vertigo, sinus pain, snoring.  CV: Denies chest pain, palpitations, irregular heartbeat, syncope, dyspnea, diaphoresis, orthopnea, PND, claudication or edema. Respiratory: denies cough, dyspnea, DOE, pleurisy, hoarseness, laryngitis, wheezing.  Gastrointestinal: Denies dysphagia, odynophagia, heartburn, reflux, water brash, abdominal pain or cramps, nausea, vomiting, bloating, diarrhea, constipation, hematemesis, melena, hematochezia  or hemorrhoids. Genitourinary: Denies dysuria, frequency, urgency, nocturia, hesitancy, discharge, hematuria or flank  pain. Musculoskeletal: Denies arthralgias, myalgias, stiffness, jt. swelling, pain, limping or strain/sprain.  Skin: Denies pruritus, rash, hives, warts, acne, eczema or change in skin lesion(s). Neuro: No weakness, tremor, incoordination, spasms, paresthesia or pain. Psychiatric: Denies confusion, memory loss or sensory loss. Endo: Denies change in weight, skin or hair change.  Heme/Lymph: No excessive bleeding, bruising or enlarged lymph nodes.  Exam:  BP 124/78  Pulse 64  Temp(Src) 97.3 F (36.3 C) (Temporal)  Resp 16  Ht 5' 4.75" (1.645 m)  Wt 147 lb (66.679 kg)  BMI 24.64 kg/m2  Appears well nourished and in no distress. Eyes: PERRLA, EOMs, conjunctiva no swelling or erythema. Sinuses: No frontal/maxillary tenderness ENT/Mouth: EAC's clear, TM's nl w/o erythema, bulging. Nares clear w/o erythema, swelling, exudates. Oropharynx clear without erythema or exudates. Oral hygiene is good. Tongue normal, non obstructing. Hearing intact.  Neck: Supple. Thyroid nl. Car 2+/2+ without bruits, nodes or JVD. Chest: Respirations nl with BS clear & equal w/o rales, rhonchi, wheezing or stridor.  Cor: Heart sounds normal w/ regular rate and rhythm without sig. murmurs, gallops, clicks, or rubs. Peripheral pulses normal and equal  without edema.  Abdomen: Soft & bowel sounds normal. Non-tender w/o guarding, rebound, hernias, masses, or organomegaly.  Lymphatics: Unremarkable.  Musculoskeletal: Full ROM all peripheral extremities, joint stability, 5/5 strength, and normal gait.  Skin: Warm, dry without exposed rashes, lesions or ecchymosis apparent.  Neuro: Cranial nerves intact, reflexes equal bilaterally. Sensory-motor testing grossly intact with a question of sl decreased sensation in a stocking distribution. Tendon reflexes grossly intact in UEs & flat or absent in the LEs Pysch: Alert & oriented x 3. Insight and judgement nl & appropriate. No ideations.  Assessment and Plan:  1.  Hypertension - Continue monitor blood pressure at home. Continue diet/meds same.  2. Hyperlipidemia - Continue diet/meds, exercise,& lifestyle modifications. Continue monitor periodic cholesterol/liver & renal functions   3. Pre-Diabetes - Continue diet, exercise, lifestyle modifications. Monitor appropriate labs.  4. Vitamin D Deficiency - Continue supplementation.  5. Query leg Discomfort - recc restart her Gabapentin at night and also try increased dose to also hopefully help with her insomnia.  Recommended regular exercise as walking, BP monitoring, weight control and discussed med and SE's. Recommended labs to assess and monitor clinical status. Further disposition pending results of labs.

## 2014-07-04 NOTE — Addendum Note (Signed)
Addended by: Unk Pinto on: 07/04/2014 01:40 PM   Modules accepted: Orders

## 2014-07-04 NOTE — Progress Notes (Signed)
Patient ID: Monica Curry, female   DOB: 04/30/1931, 80 y.o.   MRN: [id number]  Addendum - Note patient did not c/o leg pains at night (entedred on wrong patient)

## 2014-07-05 LAB — INSULIN, FASTING: Insulin fasting, serum: 16 u[IU]/mL (ref 3–28)

## 2014-07-05 LAB — VITAMIN D 25 HYDROXY (VIT D DEFICIENCY, FRACTURES): VIT D 25 HYDROXY: 69 ng/mL (ref 30–89)

## 2014-08-30 ENCOUNTER — Telehealth: Payer: Self-pay

## 2014-08-30 ENCOUNTER — Ambulatory Visit: Payer: Self-pay

## 2014-08-30 NOTE — Telephone Encounter (Signed)
Patient needs to reschedule Nurse visit because there are no high dose flu vaccinations available at this time. Left message on machine for patient to return call to reschedule appointment

## 2014-09-06 ENCOUNTER — Ambulatory Visit (INDEPENDENT_AMBULATORY_CARE_PROVIDER_SITE_OTHER): Payer: Medicare Other | Admitting: *Deleted

## 2014-09-06 DIAGNOSIS — Z23 Encounter for immunization: Secondary | ICD-10-CM

## 2014-10-06 ENCOUNTER — Ambulatory Visit (INDEPENDENT_AMBULATORY_CARE_PROVIDER_SITE_OTHER): Payer: Medicare Other | Admitting: Physician Assistant

## 2014-10-06 ENCOUNTER — Encounter: Payer: Self-pay | Admitting: Physician Assistant

## 2014-10-06 VITALS — BP 130/70 | HR 52 | Temp 97.7°F | Resp 16 | Ht 64.0 in | Wt 144.0 lb

## 2014-10-06 DIAGNOSIS — R7303 Prediabetes: Secondary | ICD-10-CM

## 2014-10-06 DIAGNOSIS — E782 Mixed hyperlipidemia: Secondary | ICD-10-CM

## 2014-10-06 DIAGNOSIS — Z79899 Other long term (current) drug therapy: Secondary | ICD-10-CM

## 2014-10-06 DIAGNOSIS — I10 Essential (primary) hypertension: Secondary | ICD-10-CM

## 2014-10-06 DIAGNOSIS — R7309 Other abnormal glucose: Secondary | ICD-10-CM

## 2014-10-06 DIAGNOSIS — E559 Vitamin D deficiency, unspecified: Secondary | ICD-10-CM

## 2014-10-06 LAB — CBC WITH DIFFERENTIAL/PLATELET
BASOS ABS: 0 10*3/uL (ref 0.0–0.1)
Basophils Relative: 0 % (ref 0–1)
Eosinophils Absolute: 0.2 10*3/uL (ref 0.0–0.7)
Eosinophils Relative: 3 % (ref 0–5)
HCT: 40.4 % (ref 36.0–46.0)
Hemoglobin: 13.8 g/dL (ref 12.0–15.0)
LYMPHS ABS: 2.5 10*3/uL (ref 0.7–4.0)
LYMPHS PCT: 50 % — AB (ref 12–46)
MCH: 28.6 pg (ref 26.0–34.0)
MCHC: 34.2 g/dL (ref 30.0–36.0)
MCV: 83.6 fL (ref 78.0–100.0)
Monocytes Absolute: 0.4 10*3/uL (ref 0.1–1.0)
Monocytes Relative: 8 % (ref 3–12)
NEUTROS ABS: 2 10*3/uL (ref 1.7–7.7)
Neutrophils Relative %: 39 % — ABNORMAL LOW (ref 43–77)
PLATELETS: 229 10*3/uL (ref 150–400)
RBC: 4.83 MIL/uL (ref 3.87–5.11)
RDW: 15.2 % (ref 11.5–15.5)
WBC: 5 10*3/uL (ref 4.0–10.5)

## 2014-10-06 NOTE — Progress Notes (Signed)
Assessment and Plan:  Hypertension: Continue medication, monitor blood pressure at home. Continue DASH diet.  Reminder to go to the ER if any CP, SOB, nausea, dizziness, severe HA, changes vision/speech, left arm numbness and tingling, and jaw pain. Cholesterol: Continue diet and exercise. Check cholesterol.  Pre-diabetes-Continue diet and exercise. Check A1C Vitamin D Def- check level and continue medications.   Continue diet and meds as discussed. Further disposition pending results of labs.  HPI 80 y.o. female  presents for 3 month follow up with hypertension, hyperlipidemia, prediabetes and vitamin D. Her blood pressure has been controlled at home, today [name] is BP: 130/70 mmHg She does workout. She denies chest pain, shortness of breath, dizziness.  She is not on cholesterol medication and denies myalgias. Her cholesterol is at goal. The cholesterol last visit was:   Lab Results  Component Value Date   CHOL 179 07/04/2014   HDL 40 07/04/2014   LDLCALC 104* 07/04/2014   TRIG 176* 07/04/2014   CHOLHDL 4.5 07/04/2014   She has been working on diet and exercise for prediabetes, and denies paresthesia of the feet, polydipsia, polyuria and visual disturbances. Last A1C in the office was:  Lab Results  Component Value Date   HGBA1C 6.0* 07/04/2014   Patient is on Vitamin D supplement.   Lab Results  Component Value Date   VD25OH 69 07/04/2014     She states that she was gabapentin for RLS but states she was having ringing in her ears and stopped it herself. She started taking melatonin and got a sound machine which helps.  She has a tremor and it is controlled on propanolol and Klonopin for this from her neurologist.  Body mass index is 24.71 kg/(m^2). Wt Readings from Last 3 Encounters:  10/06/14 144 lb (65.318 kg)  07/04/14 147 lb (66.679 kg)  06/30/14 145 lb (65.772 kg)    Current Medications:  Current Outpatient Prescriptions on File Prior to Visit  Medication Sig Dispense  Refill  . aspirin 81 MG tablet Take 81 mg by mouth daily.      . Calcium-Magnesium-Vitamin D [id number] MG-MG-UNIT TABS Take by mouth daily.      . Cholecalciferol (VITAMIN D PO) Take 2,000 Units by mouth 2 (two) times daily.       . clonazePAM (KLONOPIN) 0.5 MG tablet Take 1 pill in AM, 1/2 pill at noon and 1/2 pill at night.  60 tablet  5  . Magnesium 250 MG TABS Take 1 tablet by mouth daily.      . propranolol (INDERAL) 20 MG tablet Take 1 tablet (20 mg total) by mouth 3 (three) times daily.  90 tablet  5  . Risedronate Sodium (ATELVIA) 35 MG TBEC Take 1 tablet (35 mg total) by mouth once a week.  4 tablet  6   No current facility-administered medications on file prior to visit.   Medical History:  Past Medical History  Diagnosis Date  . Personal history of malignant neoplasm of other parts of uterus 1997  . Personal history of venous thrombosis and embolism 1997  . Essential and other specified forms of tremor   . Cancer     Endometrial cancer  . Osteoporosis 11/2013    T score -2.4 prior in 2000 -3.3, T9 compression fracture indeterminate duration  . Urethral prolapse    Allergies:  Allergies  Allergen Reactions  . Codeine   . Mysoline [Primidone]   . Pneumovax 23 [Pneumococcal Vac Polyvalent]     Red, raised  area.  Knot at site of injection     Review of Systems: [X]  = complains of  [ ]  = denies  General: Fatigue [ ]  Fever [ ]  Chills [ ]  Weakness [ ]   Insomnia [ ]  Eyes: Redness [ ]  Blurred vision [ ]  Diplopia [ ]   ENT: Congestion [ ]  Sinus Pain [ ]  Post Nasal Drip [ ]  Sore Throat [ ]  Earache [ ]   Cardiac: Chest pain/pressure [ ]  SOB [ ]  Orthopnea [ ]   Palpitations [ ]   Paroxysmal nocturnal dyspnea[ ]  Claudication [ ]  Edema [ ]   Pulmonary: Cough [ ]  Wheezing[ ]   SOB [ ]   Snoring [ ]   GI: Nausea [ ]  Vomiting[ ]  Dysphagia[ ]  Heartburn[ ]  Abdominal pain [ ]  Constipation [ ] ; Diarrhea [ ] ; BRBPR [ ]  Melena[ ]  GU: Hematuria[ ]  Dysuria [ ]  Nocturia[ ]  Urgency [ ]   Hesitancy [  ] Discharge [ ]  Neuro: Headaches[ ]  Vertigo[ ]  Paresthesias[ ]  Spasm [ ]  Speech changes [ ]  Incoordination [ ]   Ortho: Arthritis [ ]  Joint pain [ ]  Muscle pain [ ]  Joint swelling [ ]  Back Pain [ ]  Skin:  Rash [ ]   Pruritis [ ]  Change in skin lesion [ ]   Psych: Depression[ ]  Anxiety[ ]  Confusion [ ]  Memory loss [ ]   Heme/Lypmh: Bleeding [ ]  Bruising [ ]  Enlarged lymph nodes [ ]   Endocrine: Visual blurring [ ]  Paresthesia [ ]  Polyuria [ ]  Polydypsea [ ]    Heat/cold intolerance [ ]  Hypoglycemia [ ]   Family history- Review and unchanged Social history- Review and unchanged Physical Exam: BP 130/70  Pulse 52  Temp(Src) 97.7 F (36.5 C)  Resp 16  Ht 5\' 4"  (1.626 m)  Wt 144 lb (65.318 kg)  BMI 24.71 kg/m2 Wt Readings from Last 3 Encounters:  10/06/14 144 lb (65.318 kg)  07/04/14 147 lb (66.679 kg)  06/30/14 145 lb (65.772 kg)   General Appearance: Well nourished, in no apparent distress. Eyes: PERRLA, EOMs, conjunctiva no swelling or erythema Sinuses: No Frontal/maxillary tenderness ENT/Mouth: Ext aud canals clear, TMs without erythema, bulging. No erythema, swelling, or exudate on post pharynx.  Tonsils not swollen or erythematous. Hearing normal.  Neck: Supple, thyroid normal.  Respiratory: Respiratory effort normal, BS equal bilaterally without rales, rhonchi, wheezing or stridor.  Cardio: RRR with no MRGs. Brisk peripheral pulses without edema.  Abdomen: Soft, + BS.  Non tender, no guarding, rebound, hernias, masses. Lymphatics: Non tender without lymphadenopathy.  Musculoskeletal: Full ROM, 5/5 strength, normal gait.  Skin: Warm, dry without rashes, lesions, ecchymosis.  Neuro: Cranial nerves intact. Normal muscle tone, no cerebellar symptoms. Sensation intact.  Psych: Awake and oriented X 3, normal affect, Insight and Judgment appropriate.    Vicie Mutters, PA-C 2:05 PM [facility]

## 2014-10-06 NOTE — Patient Instructions (Signed)
   Bad carbs also include fruit juice, alcohol, and sweet tea. These are empty calories that do not signal to your brain that you are full.   Please remember the good carbs are still carbs which convert into sugar. So please measure them out no more than 1/2-1 cup of rice, oatmeal, pasta, and beans.  Veggies are however free foods! Pile them on.   No all fruit is created equal. Please see the list below, the fruit at the bottom is higher in sugars than the fruit at the top

## 2014-10-07 LAB — HEPATIC FUNCTION PANEL
ALBUMIN: 4.4 g/dL (ref 3.5–5.2)
ALK PHOS: 29 U/L — AB (ref 39–117)
ALT: 23 U/L (ref 0–35)
AST: 22 U/L (ref 0–37)
BILIRUBIN DIRECT: 0.1 mg/dL (ref 0.0–0.3)
BILIRUBIN INDIRECT: 0.6 mg/dL (ref 0.2–1.2)
Total Bilirubin: 0.7 mg/dL (ref 0.2–1.2)
Total Protein: 6.9 g/dL (ref 6.0–8.3)

## 2014-10-07 LAB — BASIC METABOLIC PANEL WITH GFR
BUN: 22 mg/dL (ref 6–23)
CHLORIDE: 101 meq/L (ref 96–112)
CO2: 29 mEq/L (ref 19–32)
Calcium: 9.5 mg/dL (ref 8.4–10.5)
Creat: 1.09 mg/dL (ref 0.50–1.10)
GFR, EST NON AFRICAN AMERICAN: 48 mL/min — AB
GFR, Est African American: 55 mL/min — ABNORMAL LOW
Glucose, Bld: 87 mg/dL (ref 70–99)
POTASSIUM: 4.6 meq/L (ref 3.5–5.3)
Sodium: 138 mEq/L (ref 135–145)

## 2014-10-07 LAB — MAGNESIUM: Magnesium: 2 mg/dL (ref 1.5–2.5)

## 2014-10-07 LAB — HEMOGLOBIN A1C
Hgb A1c MFr Bld: 5.6 % (ref <5.7)
Mean Plasma Glucose: 114 mg/dL (ref <117)

## 2014-10-07 LAB — VITAMIN D 25 HYDROXY (VIT D DEFICIENCY, FRACTURES): Vit D, 25-Hydroxy: 68 ng/mL (ref 30–89)

## 2014-10-07 LAB — LIPID PANEL
Cholesterol: 180 mg/dL (ref 0–200)
HDL: 45 mg/dL (ref >39)
LDL CALC: 110 mg/dL — AB (ref 0–99)
Total CHOL/HDL Ratio: 4 Ratio
Triglycerides: 126 mg/dL (ref <150)
VLDL: 25 mg/dL (ref 0–40)

## 2014-10-07 LAB — INSULIN, FASTING: Insulin fasting, serum: 2.6 u[IU]/mL (ref 2.0–19.6)

## 2014-10-07 LAB — TSH: TSH: 2.616 u[IU]/mL (ref 0.350–4.500)

## 2014-10-10 ENCOUNTER — Encounter: Payer: Self-pay | Admitting: Physician Assistant

## 2014-10-18 ENCOUNTER — Encounter: Payer: Self-pay | Admitting: Gynecology

## 2014-10-26 ENCOUNTER — Encounter: Payer: Self-pay | Admitting: Gynecology

## 2014-10-26 ENCOUNTER — Other Ambulatory Visit (HOSPITAL_COMMUNITY)
Admission: RE | Admit: 2014-10-26 | Discharge: 2014-10-26 | Disposition: A | Discharge status: Home / Self Care | Payer: Medicare Other | Source: Ambulatory Visit | Attending: Gynecology | Admitting: Gynecology

## 2014-10-26 ENCOUNTER — Ambulatory Visit (INDEPENDENT_AMBULATORY_CARE_PROVIDER_SITE_OTHER): Payer: Medicare Other | Admitting: Gynecology

## 2014-10-26 VITALS — BP 130/80 | Ht 64.0 in | Wt 143.0 lb

## 2014-10-26 DIAGNOSIS — C55 Malignant neoplasm of uterus, part unspecified: Secondary | ICD-10-CM

## 2014-10-26 DIAGNOSIS — Z124 Encounter for screening for malignant neoplasm of cervix: Secondary | ICD-10-CM | POA: Insufficient documentation

## 2014-10-26 DIAGNOSIS — N952 Postmenopausal atrophic vaginitis: Secondary | ICD-10-CM

## 2014-10-26 DIAGNOSIS — N368 Other specified disorders of urethra: Secondary | ICD-10-CM

## 2014-10-26 DIAGNOSIS — M810 Age-related osteoporosis without current pathological fracture: Secondary | ICD-10-CM

## 2014-10-26 NOTE — Addendum Note (Signed)
Addended by: Nelva Nay on: 10/26/2014 10:05 AM   Modules accepted: Orders

## 2014-10-26 NOTE — Patient Instructions (Signed)
You may obtain a copy of any labs that were done today by logging onto MyChart as outlined in the instructions provided with your AVS (after visit summary). The office will not call with normal lab results but certainly if there are any significant abnormalities then we will contact you.   Health Maintenance, Female A healthy lifestyle and preventative care can promote health and wellness.  Maintain regular health, dental, and eye exams.  Eat a healthy diet. Foods like vegetables, fruits, whole grains, low-fat dairy products, and lean protein foods contain the nutrients you need without too many calories. Decrease your intake of foods high in solid fats, added sugars, and salt. Get information about a proper diet from your caregiver, if necessary.  Regular physical exercise is one of the most important things you can do for your health. Most adults should get at least 150 minutes of moderate-intensity exercise (any activity that increases your heart rate and causes you to sweat) each week. In addition, most adults need muscle-strengthening exercises on 2 or more days a week.   Maintain a healthy weight. The body mass index (BMI) is a screening tool to identify possible weight problems. It provides an estimate of body fat based on height and weight. Your caregiver can help determine your BMI, and can help you achieve or maintain a healthy weight. For adults 20 years and older:  A BMI below 18.5 is considered underweight.  A BMI of 18.5 to 24.9 is normal.  A BMI of 25 to 29.9 is considered overweight.  A BMI of 30 and above is considered obese.  Maintain normal blood lipids and cholesterol by exercising and minimizing your intake of saturated fat. Eat a balanced diet with plenty of fruits and vegetables. Blood tests for lipids and cholesterol should begin at age 61 and be repeated every 5 years. If your lipid or cholesterol levels are high, you are over 50, or you are a high risk for heart  disease, you may need your cholesterol levels checked more frequently.Ongoing high lipid and cholesterol levels should be treated with medicines if diet and exercise are not effective.  If you smoke, find out from your caregiver how to quit. If you do not use tobacco, do not start.  Lung cancer screening is recommended for adults aged 33 80 years who are at high risk for developing lung cancer because of a history of smoking. Yearly low-dose computed tomography (CT) is recommended for people who have at least a 30-pack-year history of smoking and are a current smoker or have quit within the past 15 years. A pack year of smoking is smoking an average of 1 pack of cigarettes a day for 1 year (for example: 1 pack a day for 30 years or 2 packs a day for 15 years). Yearly screening should continue until the smoker has stopped smoking for at least 15 years. Yearly screening should also be stopped for people who develop a health problem that would prevent them from having lung cancer treatment.  If you are pregnant, do not drink alcohol. If you are breastfeeding, be very cautious about drinking alcohol. If you are not pregnant and choose to drink alcohol, do not exceed 1 drink per day. One drink is considered to be 12 ounces (355 mL) of beer, 5 ounces (148 mL) of wine, or 1.5 ounces (44 mL) of liquor.  Avoid use of street drugs. Do not share needles with anyone. Ask for help if you need support or instructions about stopping  the use of drugs.  High blood pressure causes heart disease and increases the risk of stroke. Blood pressure should be checked at least every 1 to 2 years. Ongoing high blood pressure should be treated with medicines, if weight loss and exercise are not effective.  If you are 59 to 78 years old, ask your caregiver if you should take aspirin to prevent strokes.  Diabetes screening involves taking a blood sample to check your fasting blood sugar level. This should be done once every 3  years, after age 91, if you are within normal weight and without risk factors for diabetes. Testing should be considered at a younger age or be carried out more frequently if you are overweight and have at least 1 risk factor for diabetes.  Breast cancer screening is essential preventative care for women. You should practice "breast self-awareness." This means understanding the normal appearance and feel of your breasts and may include breast self-examination. Any changes detected, no matter how small, should be reported to a caregiver. Women in their 66s and 30s should have a clinical breast exam (CBE) by a caregiver as part of a regular health exam every 1 to 3 years. After age 101, women should have a CBE every year. Starting at age 100, women should consider having a mammogram (breast X-ray) every year. Women who have a family history of breast cancer should talk to their caregiver about genetic screening. Women at a high risk of breast cancer should talk to their caregiver about having an MRI and a mammogram every year.  Breast cancer gene (BRCA)-related cancer risk assessment is recommended for women who have family members with BRCA-related cancers. BRCA-related cancers include breast, ovarian, tubal, and peritoneal cancers. Having family members with these cancers may be associated with an increased risk for harmful changes (mutations) in the breast cancer genes BRCA1 and BRCA2. Results of the assessment will determine the need for genetic counseling and BRCA1 and BRCA2 testing.  The Pap test is a screening test for cervical cancer. Women should have a Pap test starting at age 57. Between ages 25 and 35, Pap tests should be repeated every 2 years. Beginning at age 37, you should have a Pap test every 3 years as long as the past 3 Pap tests have been normal. If you had a hysterectomy for a problem that was not cancer or a condition that could lead to cancer, then you no longer need Pap tests. If you are  between ages 50 and 76, and you have had normal Pap tests going back 10 years, you no longer need Pap tests. If you have had past treatment for cervical cancer or a condition that could lead to cancer, you need Pap tests and screening for cancer for at least 20 years after your treatment. If Pap tests have been discontinued, risk factors (such as a new sexual partner) need to be reassessed to determine if screening should be resumed. Some women have medical problems that increase the chance of getting cervical cancer. In these cases, your caregiver may recommend more frequent screening and Pap tests.  The human papillomavirus (HPV) test is an additional test that may be used for cervical cancer screening. The HPV test looks for the virus that can cause the cell changes on the cervix. The cells collected during the Pap test can be tested for HPV. The HPV test could be used to screen women aged 44 years and older, and should be used in women of any age  who have unclear Pap test results. After the age of 55, women should have HPV testing at the same frequency as a Pap test.  Colorectal cancer can be detected and often prevented. Most routine colorectal cancer screening begins at the age of 44 and continues through age 20. However, your caregiver may recommend screening at an earlier age if you have risk factors for colon cancer. On a yearly basis, your caregiver may provide home test kits to check for hidden blood in the stool. Use of a small camera at the end of a tube, to directly examine the colon (sigmoidoscopy or colonoscopy), can detect the earliest forms of colorectal cancer. Talk to your caregiver about this at age 86, when routine screening begins. Direct examination of the colon should be repeated every 5 to 10 years through age 13, unless early forms of pre-cancerous polyps or small growths are found.  Hepatitis C blood testing is recommended for all people born from [birth year] through 1963 and any  individual with known risks for hepatitis C.  Practice safe sex. Use condoms and avoid high-risk sexual practices to reduce the spread of sexually transmitted infections (STIs). Sexually active women aged 36 and younger should be checked for Chlamydia, which is a common sexually transmitted infection. Older women with new or multiple partners should also be tested for Chlamydia. Testing for other STIs is recommended if you are sexually active and at increased risk.  Osteoporosis is a disease in which the bones lose minerals and strength with aging. This can result in serious bone fractures. The risk of osteoporosis can be identified using a bone density scan. Women ages 20 and over and women at risk for fractures or osteoporosis should discuss screening with their caregivers. Ask your caregiver whether you should be taking a calcium supplement or vitamin D to reduce the rate of osteoporosis.  Menopause can be associated with physical symptoms and risks. Hormone replacement therapy is available to decrease symptoms and risks. You should talk to your caregiver about whether hormone replacement therapy is right for you.  Use sunscreen. Apply sunscreen liberally and repeatedly throughout the day. You should seek shade when your shadow is shorter than you. Protect yourself by wearing long sleeves, pants, a wide-brimmed hat, and sunglasses year round, whenever you are outdoors.  Notify your caregiver of new moles or changes in moles, especially if there is a change in shape or color. Also notify your caregiver if a mole is larger than the size of a pencil eraser.  Stay current with your immunizations. Document Released: 06/10/2011 Document Revised: 03/22/2013 Document Reviewed: 06/10/2011 [facility] Patient Information 2014 [employer].

## 2014-10-26 NOTE — Progress Notes (Signed)
Monica Curry [id number]        80 y.o.  G2P2001 for follow up exam. Several issues noted below.  Past medical history,surgical history, problem list, medications, allergies, family history and social history were all reviewed and documented as reviewed in the EPIC chart.  ROS:  12 system ROS performed with pertinent positives and negatives included in the history, assessment and plan.   Additional significant findings :  none   Exam: Kim Counsellor Vitals:   10/26/14 0923  BP: 130/80  Height: 5\' 4"  (1.626 m)  Weight: 143 lb (64.864 kg)   General appearance:  Normal affect, orientation and appearance. Skin: Grossly normal HEENT: Without gross lesions.  No cervical or supraclavicular adenopathy. Thyroid normal.  Lungs:  Clear without wheezing, rales or rhonchi Cardiac: RR, without RMG Abdominal:  Soft, nontender, without masses, guarding, rebound, organomegaly or hernia Breasts:  Examined lying and sitting without masses, retractions, discharge or axillary adenopathy. Pelvic:  Ext/BUS/vagina with generalized atrophic changes. Pap of cuff done  Adnexa  Without masses or tenderness    Anus and perineum  Normal   Rectovaginal  Normal sphincter tone without palpated masses or tenderness.    Assessment/Plan:  80 y.o. G106P2001 female for follow up exam.   1. History of well differentiated endometrial adenocarcinoma, predominantly in situ with focal minimal myometrial invasion (2 mm) 1997. Status post TAH/BSO. Exam NED. Pap of cuff done. 2. Osteoporosis. DEXA 11/2013 T score -2.4. Prior DEXA 2000 with T score -3.3. T9 compression fracture of indeterminate age. Had been on bisphosphonates to include Fosamax Boniva and Alelvia for over 10 years. Had a very extensive discussion  10/21/2013 about options. I think at this point I would recommend stopping her bisphosphonates given the stability of her exam and repeating her DEXA next year after a short drug-free holiday.  Patient's comfortable with this and will go ahead and stop her Atelvia. Increased calcium and vitamin D reviewed. 3. Atrophic genital changes. Had been on Premarin cream previously but stopped last year. Does have a history of DVT with embolus. Is doing well from a symptom standpoint and will continue off of her Premarin cream. 4. Urethral prolapse. Asymptomatic to the patient. We'll continue to monitor. Stable on serial annual exam. 5. Mammogram 10/2014. Continue with annual mammography. SBE monthly reviewed.  6. Colonoscopy 2012. Repeat at their recommended interval. 7. Health maintenance. No routine blood work done as this is done at her primary physician's office. Follow up 1 year, sooner as needed.     Anastasio Auerbach MD, 9:56 AM 10/26/2014

## 2014-10-27 LAB — URINALYSIS W MICROSCOPIC + REFLEX CULTURE
BILIRUBIN URINE: NEGATIVE
Bacteria, UA: NONE SEEN
CASTS: NONE SEEN
Crystals: NONE SEEN
GLUCOSE, UA: NEGATIVE mg/dL
HGB URINE DIPSTICK: NEGATIVE
Ketones, ur: NEGATIVE mg/dL
LEUKOCYTES UA: NEGATIVE
Nitrite: NEGATIVE
PH: 5 (ref 5.0–8.0)
Protein, ur: NEGATIVE mg/dL
Specific Gravity, Urine: 1.015 (ref 1.005–1.030)
Squamous Epithelial / LPF: NONE SEEN
Urobilinogen, UA: 0.2 mg/dL (ref 0.0–1.0)

## 2014-10-27 LAB — CYTOLOGY - PAP

## 2014-10-29 ENCOUNTER — Encounter: Payer: Self-pay | Admitting: *Deleted

## 2015-01-02 ENCOUNTER — Ambulatory Visit (INDEPENDENT_AMBULATORY_CARE_PROVIDER_SITE_OTHER): Payer: 59 | Admitting: Neurology

## 2015-01-02 ENCOUNTER — Encounter: Payer: Self-pay | Admitting: Neurology

## 2015-01-02 VITALS — BP 116/72 | HR 59 | Temp 97.3°F | Ht 66.0 in | Wt 146.0 lb

## 2015-01-02 DIAGNOSIS — F419 Anxiety disorder, unspecified: Secondary | ICD-10-CM

## 2015-01-02 DIAGNOSIS — G250 Essential tremor: Secondary | ICD-10-CM

## 2015-01-02 MED ORDER — CLONAZEPAM 0.5 MG PO TABS
ORAL_TABLET | ORAL | Status: DC
Start: 2015-01-02 — End: 2015-07-03

## 2015-01-02 MED ORDER — PROPRANOLOL HCL 20 MG PO TABS: 20.0000 mg | ORAL_TABLET | Freq: Three times a day (TID) | ORAL | Status: DC

## 2015-01-02 NOTE — Progress Notes (Signed)
Subjective:    Patient ID: Monica Curry is a 81 y.o. female.  HPI     Interim history:   Monica Curry is a very pleasant 81 year old right-handed woman with an underlying medical history of uterine cancer, anxiety and PE. She is status post hysterectomy and tonsillectomy, who presents for followup consultation of Monica Curry essential tremor. She is unaccompanied today. I last saw Monica Curry on 06/30/14, at which time she reported doing well fro the tremor standpoint. She was having trouble going to sleep and staying asleep. She was worried about Monica Curry brother who had heart surgery and was in the hospital. I kept Monica Curry on propranolol and clonazepam.  Today, she reports doing well. She had tried the propranolol long-acting at one time in the past, but it did not work as well. She was prescribed trazodone 150 mg by Monica Curry PCP. She tried 1/2 pill for about 2 weeks, but it did not help and made Monica Curry drowsy during. She tried melatonin with no significant success, she bought a white noise machine and stopped the melatonin. She had a reaction to Pneumovax last year. She did take the flu shot.   I saw Monica Curry on 12/31/2013, at which time I felt she was stable. I did not change Monica Curry medications. I suggested a routine six-month checkup. She reported volunteering at the [facility]. She felt stable. She reported having a cousin with Parkinson's disease and one aunt with voice tremor. She had a bone density scan on 10/14/13 and it showed stable findings.    I first met Monica Curry on 07/02/2013, at which time I felt Monica Curry exam was stable and it did not change Monica Curry medications. I had reviewed recent blood work and did not have any new test at the time. I renewed Monica Curry prescriptions for propranolol and clonazepam at the time.   She previously followed with Dr. Morene Antu and last saw him on 12/31/2012, and which time he started Monica Curry on propranolol.   She felt the propranolol 60 mg long-acting once daily was not as effective and was switched to  propranolol 20 mg tid, clonazepam 0.5 mg tablet one in the morning, 0.25 mg at noon and 0.25 mg in the evening. Activity makes the tremor worse. She does note an improvement with alcohol and takes only one propranolol when she has had a drink. She drinks occasionally.   She has a history of tremors for many years, treated with clonazepam for a number of years, but tremor exacerbated in 1997 after Monica Curry pulmonary embolus. She had withdrawal symptoms coming off of clonazepam. She had been on Mysoline but did not tolerate it. She has seen several other doctors for the tremor. TSH was negative in the past. She was tried on methazolamide without benefit. X ray lumbar spine from 03/24/13 showed T9 compression fracture, age indeterminate.  Monica Curry Past Medical History Is Significant For: Past Medical History  Diagnosis Date  . Personal history of venous thrombosis and embolism 1997  . Essential and other specified forms of tremor   . Cancer     Endometrial cancer  . Osteoporosis 11/2013    T score -2.4 prior in 2000 -3.3, T9 compression fracture indeterminate duration  . Urethral prolapse     Monica Curry Past Surgical History Is Significant For: Past Surgical History  Procedure Laterality Date  . Cesarean section  1961  . Abdominal hysterectomy  1997  . Tonsilectomy/adenoidectomy with myringotomy  1944  . Oophorectomy      BSO    Monica Curry Family History  Is Significant For: Family History  Problem Relation Age of Onset  . Cancer Mother     Unknown type  . Cancer Father     Bone  . Cancer Brother     Prostate  . Cancer Brother     Prostate  . Heart disease Brother   . Heart disease Brother   . Heart disease Sister   . Heart disease Sister   . Diabetes Sister   . Kidney failure Sister     Monica Curry Social History Is Significant For: History   Social History  . Marital Status: Widowed    Spouse Name: N/A    Number of Children: N/A  . Years of Education: N/A   Social History Main Topics  . Smoking  status: Never Smoker   . Smokeless tobacco: None  . Alcohol Use: 0.0 oz/week     Comment: ocassionally  . Drug Use: No  . Sexual Activity: No     Comment: HYST   Other Topics Concern  . None   Social History Narrative    Monica Curry Allergies Are:  Allergies  Allergen Reactions  . Codeine   . Mysoline [Primidone]   . Pneumovax 23 [Pneumococcal Vac Polyvalent]     Red, raised area.  Knot at site of injection  :   Monica Curry Current Medications Are:  Outpatient Encounter Prescriptions as of 01/02/2015  Medication Sig  . aspirin 81 MG tablet Take 81 mg by mouth daily.  . Calcium-Magnesium-Vitamin D [phone] MG-MG-UNIT TABS Take by mouth daily.  . Cholecalciferol (VITAMIN D PO) Take 2,000 Units by mouth 2 (two) times daily.   . clonazePAM (KLONOPIN) 0.5 MG tablet Take 1 pill in AM, 1/2 pill at noon and 1/2 pill at night.  . Magnesium 250 MG TABS Take 1 tablet by mouth daily.  . propranolol (INDERAL) 20 MG tablet Take 1 tablet (20 mg total) by mouth 3 (three) times daily.  . [DISCONTINUED] Risedronate Sodium (ATELVIA) 35 MG TBEC Take 1 tablet (35 mg total) by mouth once a week. (Patient not taking: Reported on 01/02/2015)  :  Review of Systems:  Out of a complete 14 point review of systems, all are reviewed and negative with the exception of these symptoms as listed below:   Review of Systems  All other systems reviewed and are negative.   Objective:  Neurologic Exam  Physical Exam Physical Examination:   Filed Vitals:   01/02/15 1432  BP: 116/72  Pulse: 59  Temp: 97.3 F (36.3 C)   General Examination: The patient is a very pleasant 81 y.o. female in no acute distress. She appears well-developed and well-nourished and very well groomed. She is in good spirits today.   HEENT: Normocephalic, atraumatic, pupils are equal, round and reactive to light and accommodation. Funduscopic exam was normal. Extraocular tracking is good without limitation to gaze excursion or nystagmus noted.  Normal smooth pursuit is noted. Hearing is grossly intact. Face is symmetric with normal facial animation and normal facial sensation. Speech is clear with no dysarthria noted. There is no hypophonia. There is a mild lip, neck/head, and jaw tremor and a minimal voice tremor, unchanged. Neck is supple with full range of passive and active motion. There are no carotid bruits on auscultation. Oropharynx exam reveals: mild mouth dryness, adequate dental hygiene and mild airway crowding. Mallampati is class II. Tongue protrudes centrally and palate elevates symmetrically.    Chest: Clear to auscultation without wheezing, rhonchi or crackles noted.  Heart: S1+S2+0, regular and  normal without murmurs, rubs or gallops noted.   Abdomen: Soft, non-tender and non-distended with normal bowel sounds appreciated on auscultation.  Extremities: There is a touch of puffiness around the ankles bilaterally. Pedal pulses are intact.  Skin: Warm and dry without trophic changes noted. There are no varicose veins.   Musculoskeletal: exam reveals no obvious joint deformities, tenderness or joint swelling or erythema.   Neurologically:  Mental status: The patient is awake, alert and oriented in all 4 spheres. Monica Curry memory, attention, language and knowledge are appropriate. There is no aphasia, agnosia, apraxia or anomia. Speech is clear with normal prosody and enunciation. Thought process is linear. Mood is congruent and affect is normal.  Cranial nerves are as described above under HEENT exam. In addition, shoulder shrug is normal with equal shoulder height noted. Motor exam: Normal bulk, strength and tone is noted. There is no drift, or rebound. She has slight postural and action tremor in Monica Curry hands, which is intermittent, unchanged. Mildly tremulous handwriting, but legible. Romberg is negative. Reflexes are 2+ throughout. Fine motor skills are intact with normal finger taps, normal hand movements, normal rapid alternating  patting, normal foot taps and normal foot agility.  Cerebellar testing shows no dysmetria or intention tremor on finger to nose testing. Heel to shin is normal bilaterally. There is no truncal or gait ataxia.  Sensory exam is intact to light touch, pinprick, vibration, temperature sense in the upper and lower extremities.  Gait, station and balance are unremarkable. No veering to one side is noted. No leaning to one side is noted. Posture is age-appropriate and stance is narrow based. No problems turning are noted. She turns en bloc. Tandem walk is unremarkable for age.    Assessment and Plan:   In summary, Ashe Graybeal is a very pleasant 81 year old female with a history of ET. Monica Curry physical exam is again noted to be stable and I reassured the patient in that regard. She had insomnia which has since then resolved.   We talked about maintaining a healthy lifestyle in general. I encouraged the patient to eat healthy, exercise daily and keep well hydrated, to keep a scheduled bedtime and wake time routine, to not skip any meals and eat healthy snacks in between meals and to have protein with every meal. She is encouraged to drink more water.     As far as medications are concerned, I recommended the following at this time: no change. I renewed Monica Curry prescriptions for propranolol and clonazepam.  I answered all Monica Curry questions today and the patient was in agreement with the above outlined plan. I would like to see the patient back in 6 months, sooner if the need arises and encouraged Monica Curry to call with any interim questions, concerns, problems or updates and refill requests.

## 2015-01-02 NOTE — Patient Instructions (Signed)
We will continue with the propranolol and clonazepam at the current doses.  I will see you back in 6 months.

## 2015-01-09 ENCOUNTER — Ambulatory Visit: Payer: Self-pay | Admitting: Internal Medicine

## 2015-04-04 ENCOUNTER — Encounter: Payer: Self-pay | Admitting: Emergency Medicine

## 2015-04-19 ENCOUNTER — Ambulatory Visit (INDEPENDENT_AMBULATORY_CARE_PROVIDER_SITE_OTHER): Payer: Medicare Other | Admitting: Emergency Medicine

## 2015-04-19 ENCOUNTER — Encounter: Payer: Self-pay | Admitting: Emergency Medicine

## 2015-04-19 VITALS — BP 138/72 | HR 68 | Temp 98.0°F | Resp 16 | Ht 64.25 in | Wt 142.0 lb

## 2015-04-19 DIAGNOSIS — R5383 Other fatigue: Secondary | ICD-10-CM

## 2015-04-19 DIAGNOSIS — E559 Vitamin D deficiency, unspecified: Secondary | ICD-10-CM

## 2015-04-19 DIAGNOSIS — R197 Diarrhea, unspecified: Secondary | ICD-10-CM

## 2015-04-19 DIAGNOSIS — E782 Mixed hyperlipidemia: Secondary | ICD-10-CM

## 2015-04-19 DIAGNOSIS — R5381 Other malaise: Secondary | ICD-10-CM

## 2015-04-19 DIAGNOSIS — I10 Essential (primary) hypertension: Secondary | ICD-10-CM

## 2015-04-19 DIAGNOSIS — R739 Hyperglycemia, unspecified: Secondary | ICD-10-CM

## 2015-04-19 DIAGNOSIS — R634 Abnormal weight loss: Secondary | ICD-10-CM

## 2015-04-19 DIAGNOSIS — Z0000 Encounter for general adult medical examination without abnormal findings: Secondary | ICD-10-CM

## 2015-04-19 LAB — CBC WITH DIFFERENTIAL/PLATELET
Basophils Absolute: 0 10*3/uL (ref 0.0–0.1)
Basophils Relative: 1 % (ref 0–1)
EOS PCT: 3 % (ref 0–5)
Eosinophils Absolute: 0.1 10*3/uL (ref 0.0–0.7)
HEMATOCRIT: 42.5 % (ref 36.0–46.0)
Hemoglobin: 14 g/dL (ref 12.0–15.0)
LYMPHS PCT: 43 % (ref 12–46)
Lymphs Abs: 2 10*3/uL (ref 0.7–4.0)
MCH: 28.6 pg (ref 26.0–34.0)
MCHC: 32.9 g/dL (ref 30.0–36.0)
MCV: 86.7 fL (ref 78.0–100.0)
MONO ABS: 0.5 10*3/uL (ref 0.1–1.0)
MONOS PCT: 10 % (ref 3–12)
MPV: 9.6 fL (ref 8.6–12.4)
Neutro Abs: 2 10*3/uL (ref 1.7–7.7)
Neutrophils Relative %: 43 % (ref 43–77)
Platelets: 255 10*3/uL (ref 150–400)
RBC: 4.9 MIL/uL (ref 3.87–5.11)
RDW: 14.6 % (ref 11.5–15.5)
WBC: 4.7 10*3/uL (ref 4.0–10.5)

## 2015-04-19 NOTE — Progress Notes (Signed)
Subjective:    Patient ID: Monica Curry, female    DOB: 14-Sep-1931, 81 y.o.   MRN: [id number]  HPI Comments: 81 yo WF CPE and presents for 3 month F/U for HTN, Cholesterol, Pre-Dm, D. Deficient. She has been eating healthy for the most part and keeps active. She does not check BP routinely. Takes her Propranolol AD. She does not feel she needs 3-4 month rechecks despite elevated labs. She has not been on any RX for cholesterol recently. Denies CV concerns.  She is concerned with increased weight loss over last year. Down 8 # by our charts but 15 by her scales. She has recently had increase in diarrhea without obvious changes with diet. Denies Antibiotics. She has stomach cramps initially and then followed by diarrhea, loose floating, light color. NO BLOOD. AT least 2 or more times a day for several weeks. Denies new exposures except mild stress increase in past which has improved but diarrhea worse. She has mild gas increase. She still has GB   Lab Results      Component                Value               Date                      WBC                      5.0                 10/06/2014                HGB                      13.8                10/06/2014                HCT                      40.4                10/06/2014                PLT                      229                 10/06/2014                GLUCOSE                  87                  10/06/2014                CHOL                     180                 10/06/2014                TRIG                     126                 10/06/2014  HDL                      45                  10/06/2014                LDLCALC                  110*                10/06/2014                ALT                      23                  10/06/2014                AST                      22                  10/06/2014                NA                       138                 10/06/2014                K                         4.6                 10/06/2014                CL                       101                 10/06/2014                CREATININE               1.09                10/06/2014                BUN                      22                  10/06/2014                CO2                      29                  10/06/2014                TSH                      2.616               10/06/2014  HGBA1C                   5.6                 10/06/2014                MICROALBUR               0.50                03/29/2014                Medication List       This list is accurate as of: 04/19/15 10:17 AM.  Always use your most recent med list.               aspirin 81 MG tablet  Take 81 mg by mouth daily.     Calcium-Magnesium-Vitamin D [phone] MG-MG-UNIT Tabs  Take by mouth daily.     clonazePAM 0.5 MG tablet  Commonly known as:  KLONOPIN  Take 1 pill in AM, 1/2 pill at noon and 1/2 pill at night.     Magnesium 250 MG Tabs  Take 1 tablet by mouth daily.     propranolol 20 MG tablet  Commonly known as:  INDERAL  Take 1 tablet (20 mg total) by mouth 3 (three) times daily.     VITAMIN D PO  Take 2,000 Units by mouth 2 (two) times daily.       Allergies  Allergen Reactions  . Codeine   . Mysoline [Primidone]   . Pneumovax 23 [Pneumococcal Vac Polyvalent]     Red, raised area.  Knot at site of injection   Past Medical History  Diagnosis Date  . Personal history of venous thrombosis and embolism 1997  . Essential and other specified forms of tremor   . Cancer     Endometrial cancer  . Osteoporosis 11/2013    T score -2.4 prior in 2000 -3.3, T9 compression fracture indeterminate duration  . Urethral prolapse    Past Surgical History  Procedure Laterality Date  . Cesarean section  1961  . Abdominal hysterectomy  1997  . Tonsilectomy/adenoidectomy with myringotomy  1944  . Oophorectomy      BSO   History   Social History  . Marital Status: Widowed     Spouse Name: N/A  . Number of Children: N/A  . Years of Education: N/A   Social History Main Topics  . Smoking status: Never Smoker   . Smokeless tobacco: Not on file  . Alcohol Use: 0.0 oz/week     Comment: ocassionally  . Drug Use: No  . Sexual Activity: No     Comment: HYST   Other Topics Concern  . None   Social History Narrative   Family History  Problem Relation Age of Onset  . Cancer Mother     Unknown type  . Cancer Father     Bone  . Cancer Brother     Prostate  . Cancer Brother     Prostate  . Heart disease Brother   . Heart disease Brother   . Heart disease Sister   . Heart disease Sister   . Diabetes Sister   . Kidney failure Sister    MAINTENANCE: Colonoscopy:2012 1 polyp Mammo:10/17/2014 WNL BMD: 10/21/13 OSTEOPORSIS @ GYN repeat 2016- stopped RX 2015 Pap/ Pelvic:10/26/14 WNL @ GYN EYE:12/2014 WNL Dentist:Q 4 months  IMMUNIZATIONS: Tdap:2014 Pneumovax:2004 Pneumo 23-2015Declines further with residual fever/  rash after last [place]   Patient Care Team: Unk Pinto, MD as PCP - General (Internal Medicine) Ronald Lobo, MD as Consulting Physician (Gastroenterology) Fay Records, MD as Consulting Physician (Cardiology) Rolm Bookbinder, MD as Consulting Physician (Dermatology) Rutherford Guys, MD as Consulting Physician (Ophthalmology) Kathee Delton, MD as Consulting Physician (Pulmonary Disease) Anastasio Auerbach, MD as Consulting Physician (Gynecology) Lennon Alstrom, MD as Consulting Physician (Neurology) [name], MD as Attending Physician (Neurology) Lavone Neri, (Dentist) [place], [facility])   Review of Systems  Constitutional: Positive for fatigue.  Respiratory: Negative for shortness of breath.   Cardiovascular: Negative for chest pain.  Gastrointestinal: Positive for diarrhea.  Genitourinary: Negative for difficulty urinating.  Psychiatric/Behavioral: Negative for confusion and sleep disturbance. The  patient is not nervous/anxious.   All other systems reviewed and are negative.  BP 138/72 mmHg  Pulse 68  Temp(Src) 98 F (36.7 C) (Temporal)  Resp 16  Ht 5' 4.25" (1.632 m)  Wt 142 lb (64.411 kg)  BMI 24.18 kg/m2     Objective:   Physical Exam  Constitutional: She is oriented to person, place, and time. She appears well-developed and well-nourished. No distress.  HENT:  Head: Normocephalic.  Nose: Nose normal.  Mouth/Throat: Oropharynx is clear and moist.  Eyes: Conjunctivae and EOM are normal. Pupils are equal, round, and reactive to light. No scleral icterus.  Neck: Normal range of motion. Neck supple. No JVD present. No tracheal deviation present. No thyromegaly present.  Cardiovascular: Normal rate, regular rhythm, normal heart sounds and intact distal pulses.   Pulmonary/Chest: Effort normal and breath sounds normal.  Abdominal: Soft. Bowel sounds are normal. She exhibits no distension and no mass. There is no tenderness.  Genitourinary:  Def gyn  Musculoskeletal: Normal range of motion. She exhibits no edema or tenderness.  Lymphadenopathy:    She has no cervical adenopathy.  Neurological: She is alert and oriented to person, place, and time. She has normal reflexes. No cranial nerve deficit. She exhibits normal muscle tone. Coordination normal.  Skin: Skin is warm and dry. No rash noted. No erythema.  Right medial 4th toe dark flat 3 mm irreg nevi  Psychiatric: She has a normal mood and affect. Her behavior is normal. Judgment and thought content normal.  Nursing note and vitals reviewed.      AORTA SCAN WNL EKG NSCSPT   Assessment & Plan:  1. CPE- Update screening labs/ History/ Immunizations/ Testing as needed. Advised healthy diet, QD exercise, increase H20 and continue RX/ Vitamins AD.    2. Diarrhea- Check labs,  may need GI referral vs U/S ABD to evaluate for possible Gallbladder involvement.  3. Fatigue / Weight loss ? From diarrhea- Check labs if NEG may  need CT ABDOMEN to further evaluate especially if no cause for diarrhea.  4.  Irreg Nevi- monitor for any change, call if occurs for removal

## 2015-04-19 NOTE — Patient Instructions (Signed)

## 2015-04-20 LAB — URINALYSIS, ROUTINE W REFLEX MICROSCOPIC
Bilirubin Urine: NEGATIVE
GLUCOSE, UA: NEGATIVE mg/dL
Hgb urine dipstick: NEGATIVE
KETONES UR: NEGATIVE mg/dL
Leukocytes, UA: NEGATIVE
Nitrite: NEGATIVE
PH: 5 (ref 5.0–8.0)
Protein, ur: NEGATIVE mg/dL
SPECIFIC GRAVITY, URINE: 1.005 (ref 1.005–1.030)
Urobilinogen, UA: 0.2 mg/dL (ref 0.0–1.0)

## 2015-04-20 LAB — LIPID PANEL
Cholesterol: 187 mg/dL (ref 0–200)
HDL: 41 mg/dL — ABNORMAL LOW (ref >=46)
LDL CALC: 108 mg/dL — AB (ref 0–99)
TRIGLYCERIDES: 192 mg/dL — AB (ref <150)
Total CHOL/HDL Ratio: 4.6 Ratio
VLDL: 38 mg/dL (ref 0–40)

## 2015-04-20 LAB — BASIC METABOLIC PANEL WITH GFR
BUN: 22 mg/dL (ref 6–23)
CALCIUM: 9.6 mg/dL (ref 8.4–10.5)
CHLORIDE: 101 meq/L (ref 96–112)
CO2: 29 meq/L (ref 19–32)
Creat: 1.11 mg/dL — ABNORMAL HIGH (ref 0.50–1.10)
GFR, Est African American: 54 mL/min — ABNORMAL LOW
GFR, Est Non African American: 47 mL/min — ABNORMAL LOW
GLUCOSE: 80 mg/dL (ref 70–99)
POTASSIUM: 4.6 meq/L (ref 3.5–5.3)
Sodium: 138 mEq/L (ref 135–145)

## 2015-04-20 LAB — HEPATIC FUNCTION PANEL
ALBUMIN: 4.1 g/dL (ref 3.5–5.2)
ALT: 13 U/L (ref 0–35)
AST: 18 U/L (ref 0–37)
Alkaline Phosphatase: 33 U/L — ABNORMAL LOW (ref 39–117)
Bilirubin, Direct: 0.1 mg/dL (ref 0.0–0.3)
Indirect Bilirubin: 0.4 mg/dL (ref 0.2–1.2)
Total Bilirubin: 0.5 mg/dL (ref 0.2–1.2)
Total Protein: 7.1 g/dL (ref 6.0–8.3)

## 2015-04-20 LAB — VITAMIN D 25 HYDROXY (VIT D DEFICIENCY, FRACTURES): Vit D, 25-Hydroxy: 50 ng/mL (ref 30–100)

## 2015-04-20 LAB — HEMOGLOBIN A1C
Hgb A1c MFr Bld: 5.7 % — ABNORMAL HIGH (ref <5.7)
Mean Plasma Glucose: 117 mg/dL — ABNORMAL HIGH (ref <117)

## 2015-04-20 LAB — MICROALBUMIN / CREATININE URINE RATIO
CREATININE, URINE: 137.7 mg/dL
Microalb Creat Ratio: 5.1 mg/g (ref 0.0–30.0)
Microalb, Ur: 0.7 mg/dL (ref <2.0)

## 2015-04-20 LAB — MAGNESIUM: Magnesium: 2 mg/dL (ref 1.5–2.5)

## 2015-04-20 LAB — INSULIN, FASTING: Insulin fasting, serum: 5.6 u[IU]/mL (ref 2.0–19.6)

## 2015-04-20 LAB — TSH: TSH: 2.437 u[IU]/mL (ref 0.350–4.500)

## 2015-04-24 ENCOUNTER — Other Ambulatory Visit: Payer: Self-pay | Admitting: *Deleted

## 2015-04-24 DIAGNOSIS — R197 Diarrhea, unspecified: Secondary | ICD-10-CM

## 2015-04-25 ENCOUNTER — Other Ambulatory Visit: Payer: Self-pay | Admitting: Internal Medicine

## 2015-04-25 LAB — CLOSTRIDIUM DIFFICILE BY PCR: Toxigenic C. Difficile by PCR: DETECTED — CR

## 2015-04-25 MED ORDER — METRONIDAZOLE 500 MG PO TABS: ORAL_TABLET | ORAL | Status: AC

## 2015-04-27 LAB — STOOL CULTURE

## 2015-04-28 LAB — OVA AND PARASITE EXAMINATION: OP: NONE SEEN

## 2015-05-04 ENCOUNTER — Telehealth: Payer: Self-pay | Admitting: *Deleted

## 2015-05-04 NOTE — Telephone Encounter (Signed)
Patient called and reported she is about finished with Flagyl and asked if she needs a refill or OV to recheck her stool for C-diff.  Per Dr Melford Aase, no refill or stool recheck needed at this time.  Patient states she feels light-headed and dizzy at times.  Per Dr Melford Aase, increase her fluid intake and patient will call back if feeling continues.

## 2015-05-11 ENCOUNTER — Other Ambulatory Visit: Payer: Self-pay

## 2015-05-11 ENCOUNTER — Telehealth: Payer: Self-pay

## 2015-05-11 MED ORDER — METRONIDAZOLE 500 MG PO TABS: 500.0000 mg | ORAL_TABLET | Freq: Three times a day (TID) | ORAL | Status: DC

## 2015-05-11 NOTE — Telephone Encounter (Signed)
Received paper note from front office staff, patient called and advised still having diarrhea / c-diff symptoms , per Dr Melford Aase called patient and advised her that he recommends more Flagyl , she advised she has a refill although I had already sent it in. Per Dr Melford Aase she was advised that she does not need to be tested again

## 2015-05-17 ENCOUNTER — Ambulatory Visit (INDEPENDENT_AMBULATORY_CARE_PROVIDER_SITE_OTHER): Payer: Medicare Other | Admitting: Internal Medicine

## 2015-05-17 ENCOUNTER — Encounter: Payer: Self-pay | Admitting: Internal Medicine

## 2015-05-17 VITALS — BP 138/64 | HR 60 | Temp 98.2°F | Resp 18 | Ht 64.25 in | Wt 141.0 lb

## 2015-05-17 DIAGNOSIS — A047 Enterocolitis due to Clostridium difficile: Secondary | ICD-10-CM

## 2015-05-17 DIAGNOSIS — A0472 Enterocolitis due to Clostridium difficile, not specified as recurrent: Secondary | ICD-10-CM

## 2015-05-17 DIAGNOSIS — B370 Candidal stomatitis: Secondary | ICD-10-CM

## 2015-05-17 MED ORDER — NYSTATIN 100000 UNIT/ML MT SUSP: OROMUCOSAL | Status: DC

## 2015-05-17 NOTE — Patient Instructions (Signed)
Clostridium Difficile Infection Clostridium difficile (C. difficile) is a bacteria found in the intestinal tract or colon. Under certain conditions, it causes diarrhea and sometimes severe disease. The severe form of the disease is known as pseudomembranous colitis (often called C. difficile colitis). This disease can damage the lining of the colon or cause the colon to become enlarged (toxic megacolon). CAUSES Your colon normally contains many different bacteria, including C. difficile. The balance of bacteria in your colon can change during illness. This is especially true when you take antibiotic medicine. Taking antibiotics may allow the C. difficile to grow, multiply excessively, and make a toxin that then causes illness. The elderly and people with certain medical conditions have a greater risk of getting C. difficile infections. SYMPTOMS  Watery diarrhea.  Fever.  Fatigue.  Loss of appetite.  Nausea.  Abdominal swelling, pain, or tenderness.  Dehydration. DIAGNOSIS Your symptoms may make your caregiver suspect a C. difficile infection, especially if you have used antibiotics in the preceding weeks. However, there are only 2 ways to know for certain whether you have a C. difficile infection:  A lab test that finds the toxin in your stool.  The specific appearance of an abnormality (pseudomembrane) in your colon. This can only be seen by doing a sigmoidoscopy or colonoscopy. These procedures involve passing an instrument through your rectum to look at the inside of your colon. Your caregiver will help determine if these tests are necessary. TREATMENT  Most people are successfully treated with one of two specific antibiotics, usually given by mouth. Other antibiotics you are receiving are stopped if possible.  Intravenous (IV) fluids and correction of electrolyte imbalance may be necessary.  Rarely, surgery may be needed to remove the infected part of the intestines.  Careful  hand washing by you and your caregivers is important to prevent the spread of infection. In the hospital, your caregivers may also put on gowns and gloves to prevent the spread of the C. difficile bacteria. Your room is also cleaned regularly with a solution containing bleach or a product that is known to kill C. difficile. HOME CARE INSTRUCTIONS  Drink enough fluids to keep your urine clear or pale yellow. Avoid milk, caffeine, and alcohol.  Ask your caregiver for specific rehydration instructions.  Try eating small, frequent meals rather than large meals.  Take your antibiotics as directed. Finish them even if you start to feel better.  Do not use medicines to slow diarrhea. This could delay healing or cause complications.  Wash your hands thoroughly after using the bathroom and before preparing food.  Make sure people who live with you wash their hands often, too.  Carefully disinfect all surfaces with a product that contains chlorine bleach. SEEK MEDICAL CARE IF:  Diarrhea persists longer than expected or recurs after completing your course of antibiotic treatment for the C. difficile infection.  You have trouble staying hydrated. SEEK IMMEDIATE MEDICAL CARE IF:  You develop a new fever.  You have increasing abdominal pain or tenderness.  There is blood in your stools, or your stools are dark black and tarry.  You cannot hold down food or liquids. MAKE SURE YOU:  Understand these instructions.  Will watch your condition.  Will get help right away if you are not doing well or get worse. Document Released: 09/04/2005 Document Revised: 04/11/2014 Document Reviewed: 05/03/2011 [facility] Patient Information 2015 [place], [place]. This information is not intended to replace advice given to you by your health care provider. Make sure you  discuss any questions you have with your health care provider.  Clostridium Difficile FAQs What is Clostridium difficile infection?    Clostridium difficile [pronounced Klo-STRID-ee-um dif-uh-SEEL], also known as "C. diff" [See-dif], is a germ that can cause diarrhea. Most cases of C. diff infection occur in patients taking antibiotics. The most common symptoms of a C. diff infection include:  Watery diarrhea  Fever  Loss of appetite  Nausea  Belly pain and tenderness Who is most likely to get C. diff infection? The elderly and people with certain medical problems have the greatest chance of getting C. diff. C. diff spores can live outside the human body for a very long time and may be found on things in the environment such as bed linens, bed rails, bathroom fixtures, and medical equipment. C. diff infection can spread from person-to-person on contaminated equipment and on the hands of doctors, nurses, other healthcare providers and visitors. Can C. diff infection be treated? Yes, there are antibiotics that can be used to treat C. diff. In some severe cases, a person might have to have surgery to remove the infected part of the intestines. This surgery is needed in only 1 or 2 out of every 100 persons with C. diff. What are some of the things that hospitals are doing to prevent C. diff infections? To prevent C. diff infections, doctors, nurses, and other healthcare providers:  Clean their hands with soap and water or an alcohol-based hand rub before and after caring for every patient. This can prevent C. diff and other germs from being passed from one patient to another on their hands.  Carefully clean hospital rooms and medical equipment that have been used for patients with C. diff.  Use Contact Precautions to prevent C. diff from spreading to other patients. Contact Precautions mean:  Whenever possible, patients with C. diff will have a single room or share a room only with someone else who also has C. diff.  Healthcare providers will put on gloves and wear a gown over their clothing while taking care of patients  with C. diff.  Visitors may also be asked to wear a gown and gloves.  When leaving the room, hospital providers and visitors remove their gown and gloves and clean their hands.  Patients on Contact Precautions are asked to stay in their hospital rooms as much as possible. They should not go to common areas, such as the gift shop or cafeteria. They can go to other areas of the hospital for treatments and tests.  Only give patients antibiotics when it is necessary. What can I do to help prevent C. diff infections?  Make sure that all doctors, nurses, and other healthcare providers clean their hands with soap and water or an alcohol-based hand rub before and after caring for you.  If you do not see your providers clean their hands, please ask them to do so.  Only take antibiotics as prescribed by your doctor.  Be sure to clean your own hands often, especially after using the bathroom and before eating. Can my friends and family get C. diff when they visit me? C. diff infection usually does not occur in persons who are not taking antibiotics. Visitors are not likely to get C. diff. Still, to make it safer for visitors, they should:  Clean their hands before they enter your room and as they leave your room  Ask the nurse if they need to wear protective gowns and gloves when they visit you. What do  I need to do when I go home from the hospital? Once you are back at home, you can return to your normal routine. Often, the diarrhea will be better or completely gone before you go home. This makes giving C. diff to other people much less likely. There are a few things you should do, however, to lower the chances of developing C. diff infection again or of spreading it to others.  If you are given a prescription to treat C. diff, take the medicine exactly as prescribed by your doctor and pharmacist. Do not take half-doses or stop before you run out.  Wash your hands often, especially after going to  the bathroom and before preparing food.  People who live with you should wash their hands often as well.  If you develop more diarrhea after you get home, tell your doctor immediately.  Your doctor may give you additional instructions. If you have questions, please ask your doctor or nurse. Developed and co-sponsored by [employer] for [place] [phone]); Infectious Diseases Society of [place] (IDSA); [place]; Association for Professionals in Infection Control and Epidemiology (APIC); Centers for Disease Control and Prevention (CDC); and [employer]. Document Released: 11/30/2013 Document Reviewed: 11/30/2013 [facility] Patient Information 2015 [place], [place]. This information is not intended to replace advice given to you by your health care provider. Make sure you discuss any questions you have with your health care provider.

## 2015-05-17 NOTE — Progress Notes (Signed)
   Subjective:    Patient ID: Monica Curry, female    DOB: December 22, 1930, 81 y.o.   MRN: [id number]  Diarrhea  Pertinent negatives include no abdominal pain, chills, fever or vomiting.   Patient reports to the office for evaluation of diarrhea which has been going on intermittently for the past several months.  Patient was diagnosed with C. Difficile diarrhea and she has been on flagyl for 10 day treatment of the diarrhea.  She finished that course and still had some mild diarrhea after that.  She reports that after 5 days the diarrhea returned.  She has been taking the flagyl and still has multiple days left.  She reports that after her evening dose she reports that she noticed her tongue was white.  She reports that she has not had any doses of antibiotics after that.  She reports that after stopping the flagyl she had one episode of diarrhea, but today she has not had any diarrhea today.  She reports that her tongue still feels strange and does not feel right but it is not painful or sore at all.     Review of Systems  Constitutional: Negative for fever, chills and fatigue.  HENT: Positive for mouth sores.   Respiratory: Negative for chest tightness, shortness of breath and wheezing.   Cardiovascular: Negative for chest pain and palpitations.  Gastrointestinal: Positive for diarrhea. Negative for nausea, vomiting, abdominal pain and constipation.  Genitourinary: Negative for dysuria, urgency, frequency, hematuria, flank pain and difficulty urinating.       Objective:   Physical Exam  Constitutional: She is oriented to person, place, and time. She appears well-developed and well-nourished. No distress.  HENT:  Head: Normocephalic and atraumatic.  Mouth/Throat: No oropharyngeal exudate.  White plaque covering the tongue  Eyes: Conjunctivae are normal. No scleral icterus.  Neck: Normal range of motion. Neck supple. No JVD present. No thyromegaly present.  Cardiovascular: Normal rate,  regular rhythm, normal heart sounds and intact distal pulses.   Pulmonary/Chest: Effort normal and breath sounds normal. No respiratory distress. She has no wheezes. She has no rales. She exhibits no tenderness.  Abdominal: Soft. Bowel sounds are normal. She exhibits no distension and no mass. There is tenderness in the right lower quadrant and left lower quadrant. There is no rebound and no guarding.  Musculoskeletal: Normal range of motion.  Lymphadenopathy:    She has no cervical adenopathy.  Neurological: She is alert and oriented to person, place, and time.  Skin: Skin is warm and dry. She is not diaphoretic.  Psychiatric: She has a normal mood and affect. Her behavior is normal. Judgment and thought content normal.  Nursing note and vitals reviewed.         Assessment & Plan:    1. C. difficile diarrhea -cont flagyl and finish second course of abx -BRAT diet -after finishing course of abx try an abx holiday for a couple weeks to see if resolved -if continued diarrhea consider oral vanc  2. Thrush -nystatin mouth wash prn

## 2015-06-21 ENCOUNTER — Encounter: Payer: Self-pay | Admitting: Internal Medicine

## 2015-06-21 ENCOUNTER — Ambulatory Visit (INDEPENDENT_AMBULATORY_CARE_PROVIDER_SITE_OTHER): Payer: Medicare Other | Admitting: Internal Medicine

## 2015-06-21 VITALS — BP 116/80 | HR 68 | Temp 97.8°F | Resp 16 | Ht 64.25 in | Wt 140.0 lb

## 2015-06-21 DIAGNOSIS — Z711 Person with feared health complaint in whom no diagnosis is made: Secondary | ICD-10-CM

## 2015-06-21 DIAGNOSIS — K1329 Other disturbances of oral epithelium, including tongue: Secondary | ICD-10-CM

## 2015-06-21 NOTE — Progress Notes (Signed)
Subjective:    Patient ID: Monica Curry, female    DOB: 04-26-1931, 81 y.o.   MRN: [id number]  HPI  Patient presents to the office for evaluation of weight loss and some coating of her tongue.  She reports that she has finished the course of the nystatin mouth wash but still feels like she has a thick coating on her tongue.  She is also concerned that she feels like she is losing weight.  She reports that a person told her she looked like she has lost weight.  She reports that during the C. Diff she was eating a lot more soups, starches, and bland foods.  She reports that now she has been eating a lot more vegetables and a larger amount of calories.  She feels like she should be gaining more weight.  She reports that she has even tried taking boost which she feels like makes her have more bowel movement.  She reports stopping magnesium because it causes her to have more BM.  She reports that her stools have been soft but formed.  She has not had any runny or loose stools since completing the course of the flagyl.  She reports that she also has her stool cards from her physical.    She has never been a smoker.  She does not drink alcohol regularly.     Review of Systems  Constitutional: Positive for fatigue. Negative for fever and chills.  HENT: Positive for sore throat (scratchy). Negative for congestion, postnasal drip, sinus pressure, trouble swallowing and voice change.   Respiratory: Negative for cough, choking, shortness of breath and wheezing.   Gastrointestinal: Negative for abdominal pain, diarrhea and constipation.       Objective:   Physical Exam  Constitutional: She is oriented to person, place, and time. She appears well-developed and well-nourished. No distress.  HENT:  Head: Normocephalic and atraumatic.  Mouth/Throat: No oropharyngeal exudate.  Patient with white discoloration to the tongue with no irritaiton.  Easily scraped off and without bleeding or erythema  underneath plaque.  Eyes: Conjunctivae are normal. No scleral icterus.  Neck: Normal range of motion. Neck supple. No JVD present. No thyromegaly present.  Cardiovascular: Normal rate, regular rhythm, normal heart sounds and intact distal pulses.  Exam reveals no gallop and no friction rub.   No murmur heard. Pulmonary/Chest: Effort normal and breath sounds normal. No respiratory distress. She has no wheezes. She has no rales. She exhibits no tenderness.  Abdominal: Soft. Bowel sounds are normal. She exhibits no distension and no mass. There is no tenderness. There is no rebound and no guarding.  Musculoskeletal: Normal range of motion.  Lymphadenopathy:    She has no cervical adenopathy.  Neurological: She is alert and oriented to person, place, and time. She has normal strength. No cranial nerve deficit or sensory deficit. Coordination normal.  Skin: Skin is warm and dry. She is not diaphoretic.  Psychiatric: She has a normal mood and affect. Her behavior is normal. Judgment and thought content normal.  Nursing note and vitals reviewed.   Wt Readings from Last 3 Encounters:  06/21/15 140 lb (63.504 kg)  05/17/15 141 lb (63.957 kg)  04/19/15 142 lb (64.411 kg)   Filed Vitals:   06/21/15 1107  BP: 116/80  Pulse: 68  Temp: 97.8 F (36.6 C)  Resp: 16          Assessment & Plan:    1. Tongue plaque -does not appear to be leukoplakia, oral lichen  planus, or candidal infection.   -have already completed nystatin wash -patient is not drinking much water.  Recommended increasing water intake and also visiting dentist -biotine mouth wash  2. Worried well -weight loss is not significant in the setting of recent C. Diff infection -reassurance given and will monitor weight at regular office visits.   -patient instructed on healthy diet

## 2015-06-22 ENCOUNTER — Other Ambulatory Visit: Payer: Self-pay | Admitting: *Deleted

## 2015-06-22 DIAGNOSIS — Z1212 Encounter for screening for malignant neoplasm of rectum: Secondary | ICD-10-CM

## 2015-06-22 LAB — POC HEMOCCULT BLD/STL (HOME/3-CARD/SCREEN)
Card #2 Fecal Occult Blod, POC: NEGATIVE
Card #3 Fecal Occult Blood, POC: NEGATIVE
Fecal Occult Blood, POC: NEGATIVE

## 2015-07-03 ENCOUNTER — Encounter: Payer: Self-pay | Admitting: Neurology

## 2015-07-03 ENCOUNTER — Ambulatory Visit (INDEPENDENT_AMBULATORY_CARE_PROVIDER_SITE_OTHER): Payer: Medicare Other | Admitting: Neurology

## 2015-07-03 VITALS — BP 102/62 | HR 68 | Resp 14 | Ht 64.25 in | Wt 140.0 lb

## 2015-07-03 DIAGNOSIS — G250 Essential tremor: Secondary | ICD-10-CM

## 2015-07-03 DIAGNOSIS — F419 Anxiety disorder, unspecified: Secondary | ICD-10-CM | POA: Diagnosis not present

## 2015-07-03 MED ORDER — CLONAZEPAM 0.5 MG PO TABS: ORAL_TABLET | ORAL | Status: DC

## 2015-07-03 MED ORDER — PROPRANOLOL HCL 20 MG PO TABS
20.0000 mg | ORAL_TABLET | Freq: Three times a day (TID) | ORAL | Status: DC
Start: 2015-07-03 — End: 2016-01-08

## 2015-07-03 NOTE — Progress Notes (Signed)
Subjective:    Curry ID: Monica Curry is a 81 y.o. female.  HPI     Interim history:   Monica Curry is a very pleasant 81 year old right-handed woman with an underlying medical history of uterine cancer, anxiety and PE. She is status post hysterectomy and tonsillectomy, who presents for followup consultation of Monica Curry essential tremor. She is unaccompanied today. I last saw Monica Curry on 01/02/2015, at which time she reported doing well. She had tried Monica propranolol long-acting at one time in Monica past, but it did not work as well. She was prescribed trazodone 150 mg by Monica Curry PCP. She tried 1/2 pill for about 2 weeks, but it did not help and made Monica Curry drowsy during Monica day. She tried melatonin with no significant success, and she bought a white noise machine and stopped Monica melatonin. She had a reaction to Pneumovax in 2015 and did take Monica flu shot. I suggested she continue low-dose clonazepam and kept Monica Curry on Inderal generic 20 mg 3 times a day.  Today, 07/03/2015: She reports having had C diff colitis in May and went through 2 rounds of Flagyl, then developed thrush, was treated with nystatin, had residual problems with coated tongue, but has been off of nystatin for about a month. Tremor wise, she feels stable and had no SEs.  Previously:  I saw Monica Curry on 06/30/14, at which time she reported doing well fro Monica tremor standpoint. She was having trouble going to sleep and staying asleep. She was worried about Monica Curry brother who had heart surgery and was in [facility] hospital. I kept Monica Curry on propranolol and clonazepam.  I saw Monica Curry on 12/31/2013, at which time I felt she was stable. I did not change Monica Curry medications. I suggested a routine six-month checkup. She reported volunteering at [facility]. She felt stable. She reported having a cousin with Parkinson's disease and one aunt with voice tremor. She had a bone density scan on 10/14/13 and it showed stable findings.    I first met Monica Curry on 07/02/2013, at which  time I felt Monica Curry exam was stable and it did not change Monica Curry medications. I had reviewed recent blood work and did not have any new test at Monica time. I renewed Monica Curry prescriptions for propranolol and clonazepam at Monica time.   She previously followed with Dr. Morene Antu and last saw him on 12/31/2012, and which time he started Monica Curry on propranolol.   She felt Monica propranolol 60 mg long-acting once daily was not as effective and was switched to propranolol 20 mg tid, clonazepam 0.5 mg tablet one in Monica morning, 0.25 mg at noon and 0.25 mg in Monica evening. Activity makes Monica tremor worse. She does note an improvement with alcohol and takes only one propranolol when she has had a drink. She drinks occasionally.   She has a history of tremors for many years, treated with clonazepam for a number of years, but tremor exacerbated in 1997 after Monica Curry pulmonary embolus. She had withdrawal symptoms coming off of clonazepam. She had been on Mysoline but did not tolerate it. She has seen several other doctors for Monica tremor. TSH was negative in Monica past. She was tried on methazolamide without benefit. X ray lumbar spine from 03/24/13 showed T9 compression fracture, age indeterminate.   Monica Curry Past Medical History Is Significant For: Past Medical History  Diagnosis Date  . Personal history of venous thrombosis and embolism 1997  . Essential and other specified forms of tremor   . Cancer  Endometrial cancer  . Osteoporosis 11/2013    T score -2.4 prior in 2000 -3.3, T9 compression fracture indeterminate duration  . Urethral prolapse   . C. difficile colitis   . Thrush     Monica Curry Past Surgical History Is Significant For: Past Surgical History  Procedure Laterality Date  . Cesarean section  1961  . Abdominal hysterectomy  1997  . Tonsilectomy/adenoidectomy with myringotomy  1944  . Oophorectomy      BSO    Monica Curry Family History Is Significant For: Family History  Problem Relation Age of Onset  . Cancer Mother      Unknown type  . Cancer Father     Bone  . Cancer Brother     Prostate  . Cancer Brother     Prostate  . Heart disease Brother   . Heart disease Brother   . Heart disease Sister   . Heart disease Sister   . Diabetes Sister   . Kidney failure Sister     Monica Curry Social History Is Significant For: History   Social History  . Marital Status: Widowed    Spouse Name: N/A  . Number of Children: N/A  . Years of Education: N/A   Social History Main Topics  . Smoking status: Never Smoker   . Smokeless tobacco: Not on file  . Alcohol Use: 0.0 oz/week    0 Standard drinks or equivalent per week     Comment: ocassionally  . Drug Use: No  . Sexual Activity: No     Comment: HYST   Other Topics Concern  . None   Social History Narrative    Monica Curry Allergies Are:  Allergies  Allergen Reactions  . Codeine   . Mysoline [Primidone]   . Pneumovax 23 [Pneumococcal Vac Polyvalent]     Red, raised area.  Knot at site of injection  :   Monica Curry Current Medications Are:  Outpatient Encounter Prescriptions as of 07/03/2015  Medication Sig  . aspirin 81 MG tablet Take 81 mg by mouth daily.  . Calcium-Magnesium-Vitamin D [phone] MG-MG-UNIT TABS Take by mouth daily.  . Cholecalciferol (VITAMIN D PO) Take 2,000 Units by mouth 2 (two) times daily.   . clonazePAM (KLONOPIN) 0.5 MG tablet Take 1 pill in AM, 1/2 pill at noon and 1/2 pill at night.  . Lactobacillus (PROBIOTIC ACIDOPHILUS) TABS Take by mouth daily.  . propranolol (INDERAL) 20 MG tablet Take 1 tablet (20 mg total) by mouth 3 (three) times daily.   No facility-administered encounter medications on file as of 07/03/2015.  :  Review of Systems:  Out of a complete 14 point review of systems, all are reviewed and negative with Monica exception of these symptoms as listed below:   Review of Systems  All other systems reviewed and are negative.   Objective:  Neurologic Exam  Physical Exam Physical Examination:   Filed Vitals:    07/03/15 1111  BP: 102/62  Pulse: 68  Resp: 14   General Examination: Monica Curry is a very pleasant 81 y.o. female in no acute distress. She appears well-developed and well-nourished and very well groomed.  HEENT: Normocephalic, atraumatic, pupils are equal, round and reactive to light and accommodation. Funduscopic exam was normal. Extraocular tracking is good without limitation to gaze excursion or nystagmus noted. Normal smooth pursuit is noted. Hearing is grossly intact. Face is symmetric with normal facial animation and normal facial sensation. Speech is clear with no dysarthria noted. There is no hypophonia. There is a mild  lip, neck/head, and jaw tremor and a minimal voice tremor, unchanged. Neck is supple with full range of passive and active motion. There are no carotid bruits on auscultation. Oropharynx exam reveals: mild mouth dryness, adequate dental hygiene and mild airway crowding. Mallampati is class II. Tongue protrudes centrally and palate elevates symmetrically. No abnormal tongue plaque or coating is noted.    Chest: Clear to auscultation without wheezing, rhonchi or crackles noted.  Heart: S1+S2+0, regular and normal without murmurs, rubs or gallops noted.   Abdomen: Soft, non-tender and non-distended with normal bowel sounds appreciated on auscultation.  Extremities: There is a touch of puffiness around Monica ankles bilaterally. Pedal pulses are intact.  Skin: Warm and dry without trophic changes noted. There are no varicose veins.   Musculoskeletal: exam reveals no obvious joint deformities, tenderness or joint swelling or erythema.   Neurologically:  Mental status: Monica Curry is awake, alert and oriented in all 4 spheres. Monica Curry memory, attention, language and knowledge are appropriate. There is no aphasia, agnosia, apraxia or anomia. Speech is clear with normal prosody and enunciation. Thought process is linear. Mood is congruent and affect is normal.  Cranial nerves are  as described above under HEENT exam. In addition, shoulder shrug is normal with equal shoulder height noted. Motor exam: Normal bulk, strength and tone is noted. There is no drift, or rebound. She has minimal postural and action tremor in Monica Curry hands, which is intermittent. Romberg is negative. Reflexes are 2+ throughout. Fine motor skills are intact with normal finger taps, normal hand movements, normal rapid alternating patting, normal foot taps and normal foot agility.  Cerebellar testing shows no dysmetria or intention tremor on finger to nose testing. Heel to shin is normal bilaterally. There is no truncal or gait ataxia.  Sensory exam is intact to light touch, pinprick, vibration, temperature sense in Monica upper and lower extremities.  Gait, station and balance are unremarkable. No veering to one side is noted. No leaning to one side is noted. Posture is age-appropriate and stance is narrow based. No problems turning are noted. She turns en bloc. Tandem walk is unremarkable for age.    Assessment and Plan:   In summary, Ayannah Faddis is a very pleasant 81 year old female with a history of ET. Monica Curry physical exam is again noted to be stable and I reassured Monica Curry in that regard. She had a bout of C diff, for which she was treated, then nystatin, for which she was treated.  We talked about maintaining a healthy lifestyle in general and tremor triggers. I encouraged Monica Curry to eat healthy, exercise daily and keep well hydrated, to keep a scheduled bedtime and wake time routine, to not skip any meals and eat healthy snacks in between meals and to have protein with every meal. She is encouraged to drink more water.     As far as medications are concerned, I recommended Monica following at this time: no change. I renewed Monica Curry prescriptions for propranolol and clonazepam for 30 days with refills.  I answered all Monica Curry questions today and Monica Curry was in agreement with Monica above outlined plan. I would  like to see Monica Curry back in 6 months, sooner if Monica need arises and encouraged Monica Curry to call with any interim questions, concerns, problems or updates and refill requests.

## 2015-07-03 NOTE — Patient Instructions (Signed)
  Please remember, that any kind of tremor may be exacerbated by anxiety, anger, nervousness, excitement, dehydration, sleep deprivation, by caffeine, and low blood sugar values or blood sugar fluctuations. Some medications, especially some antidepressants and lithium can cause or exacerbate tremors. Tremors may temporarily calm down her subside with the use of a benzodiazepine such as Valium or related medications and with alcohol. Be aware however that drinking alcohol is not an approved treatment or appropriate treatment for tremor control and long-term use of benzodiazepines such as Valium, lorazepam, alprazolam, or clonazepam can cause habit formation, physical and psychological addiction.

## 2015-07-21 ENCOUNTER — Encounter: Payer: Self-pay | Admitting: Internal Medicine

## 2015-07-21 ENCOUNTER — Ambulatory Visit (INDEPENDENT_AMBULATORY_CARE_PROVIDER_SITE_OTHER): Payer: Medicare Other | Admitting: Internal Medicine

## 2015-07-21 VITALS — BP 122/72 | HR 64 | Temp 97.5°F | Resp 16 | Ht 64.75 in | Wt 140.0 lb

## 2015-07-21 DIAGNOSIS — Z79899 Other long term (current) drug therapy: Secondary | ICD-10-CM

## 2015-07-21 DIAGNOSIS — R7309 Other abnormal glucose: Secondary | ICD-10-CM

## 2015-07-21 DIAGNOSIS — E559 Vitamin D deficiency, unspecified: Secondary | ICD-10-CM

## 2015-07-21 DIAGNOSIS — Z6823 Body mass index (BMI) 23.0-23.9, adult: Secondary | ICD-10-CM

## 2015-07-21 DIAGNOSIS — I10 Essential (primary) hypertension: Secondary | ICD-10-CM | POA: Diagnosis not present

## 2015-07-21 DIAGNOSIS — E782 Mixed hyperlipidemia: Secondary | ICD-10-CM

## 2015-07-21 DIAGNOSIS — N1832 Chronic kidney disease, stage 3b: Secondary | ICD-10-CM | POA: Insufficient documentation

## 2015-07-21 DIAGNOSIS — G250 Essential tremor: Secondary | ICD-10-CM

## 2015-07-21 DIAGNOSIS — Z0000 Encounter for general adult medical examination without abnormal findings: Secondary | ICD-10-CM | POA: Insufficient documentation

## 2015-07-21 DIAGNOSIS — N183 Chronic kidney disease, stage 3 unspecified: Secondary | ICD-10-CM

## 2015-07-21 DIAGNOSIS — Z0001 Encounter for general adult medical examination with abnormal findings: Secondary | ICD-10-CM | POA: Diagnosis not present

## 2015-07-21 DIAGNOSIS — R7303 Prediabetes: Secondary | ICD-10-CM

## 2015-07-21 DIAGNOSIS — R6889 Other general symptoms and signs: Secondary | ICD-10-CM | POA: Diagnosis not present

## 2015-07-21 DIAGNOSIS — Z1331 Encounter for screening for depression: Secondary | ICD-10-CM

## 2015-07-21 DIAGNOSIS — M810 Age-related osteoporosis without current pathological fracture: Secondary | ICD-10-CM

## 2015-07-21 DIAGNOSIS — Z9181 History of falling: Secondary | ICD-10-CM

## 2015-07-21 LAB — TSH: TSH: 2.911 u[IU]/mL (ref 0.350–4.500)

## 2015-07-21 NOTE — Patient Instructions (Signed)
Recommend Adult Low dose Aspirin or coated  Aspirin 81 mg daily   To reduce risk of Colon Cancer 20 %,   Skin Cancer 26 % ,   Melanoma 46%   and   Pancreatic cancer 60% ++++++++++++++++++ Vitamin D goal is between 70-100.   Please make sure that you are taking your Vitamin D as directed.   It is very important as a natural anti-inflammatory   helping hair, skin, and nails, as well as reducing stroke and heart attack risk.   It helps your bones and helps with mood.  It also decreases numerous cancer risks so please take it as directed.   Low Vit D is associated with a 200-300% higher risk for CANCER   and 200-300% higher risk for HEART   ATTACK  &  STROKE.   ......................................  It is also associated with higher death rate at younger ages,   autoimmune diseases like Rheumatoid arthritis, Lupus, Multiple Sclerosis.     Also many other serious conditions, like depression, Alzheimer's  Dementia, infertility, muscle aches, fatigue, fibromyalgia - just to name a few.  +++++++++++++++++++  Recommend the book "The END of DIETING" by Dr Joel Fuhrman   & the book "The END of DIABETES " by Dr Joel Fuhrman  At [employer] - get book & Audio CD's     Being diabetic has a  300% increased risk for heart attack, stroke, cancer, and alzheimer- type vascular dementia. It is very important that you work harder with diet by avoiding all foods that are white. Avoid white rice (brown & wild rice is OK), white potatoes (sweetpotatoes in moderation is OK), White bread or wheat bread or anything made out of white flour like bagels, donuts, rolls, buns, biscuits, cakes, pastries, cookies, pizza crust, and pasta (made from white flour & egg whites) - vegetarian pasta or spinach or wheat pasta is OK. Multigrain breads like Arnold's or [employer], or multigrain sandwich thins or flatbreads.  Diet, exercise and weight loss can reverse and cure diabetes in the early stages.   Diet, exercise and weight loss is very important in the control and prevention of complications of diabetes which affects every system in your body, ie. Brain - dementia/stroke, eyes - glaucoma/blindness, heart - heart attack/heart failure, kidneys - dialysis, stomach - gastric paralysis, intestines - malabsorption, nerves - severe painful neuritis, circulation - gangrene & loss of a leg(s), and finally cancer and Alzheimers.    I recommend avoid fried & greasy foods,  sweets/candy, white rice (brown or wild rice or Quinoa is OK), white potatoes (sweet potatoes are OK) - anything made from white flour - bagels, doughnuts, rolls, buns, biscuits,white and wheat breads, pizza crust and traditional pasta made of white flour & egg white(vegetarian pasta or spinach or wheat pasta is OK).  Multi-grain bread is OK - like multi-grain flat bread or sandwich thins. Avoid alcohol in excess. Exercise is also important.    Eat all the vegetables you want - avoid meat, especially red meat and dairy - especially cheese.  Cheese is the most concentrated form of trans-fats which is the worst thing to clog up our arteries. Veggie cheese is OK which can be found in the fresh produce section at [employer] or [employer] or [employer]  ++++++++++++++++++++++++++   Preventive Care for Adults  A healthy lifestyle and preventive care can promote health and wellness. Preventive health guidelines for women include the following key practices.  A routine yearly physical is a good way   to check with your health care provider about your health and preventive screening. It is a chance to share any concerns and updates on your health and to receive a thorough exam.  Visit your dentist for a routine exam and preventive care every 6 months. Brush your teeth twice a day and floss once a day. Good oral hygiene prevents tooth decay and gum disease.  The frequency of eye exams is based on your age, health, family medical history, use  of contact lenses, and other factors. Follow your health care provider's recommendations for frequency of eye exams.  Eat a healthy diet. Foods like vegetables, fruits, whole grains, low-fat dairy products, and lean protein foods contain the nutrients you need without too many calories. Decrease your intake of foods high in solid fats, added sugars, and salt. Eat the right amount of calories for you.Get information about a proper diet from your health care provider, if necessary.  Regular physical exercise is one of the most important things you can do for your health. Most adults should get at least 150 minutes of moderate-intensity exercise (any activity that increases your heart rate and causes you to sweat) each week. In addition, most adults need muscle-strengthening exercises on 2 or more days a week.  Maintain a healthy weight. The body mass index (BMI) is a screening tool to identify possible weight problems. It provides an estimate of body fat based on height and weight. Your health care provider can find your BMI and can help you achieve or maintain a healthy weight.For adults 20 years and older:  A BMI below 18.5 is considered underweight.  A BMI of 18.5 to 24.9 is normal.  A BMI of 25 to 29.9 is considered overweight.  A BMI of 30 and above is considered obese.  Maintain normal blood lipids and cholesterol levels by exercising and minimizing your intake of saturated fat. Eat a balanced diet with plenty of fruit and vegetables. If your lipid or cholesterol levels are high, you are over 50, or you are at high risk for heart disease, you may need your cholesterol levels checked more frequently.Ongoing high lipid and cholesterol levels should be treated with medicines if diet and exercise are not working.  If you smoke, find out from your health care provider how to quit. If you do not use tobacco, do not start.  Lung cancer screening is recommended for adults aged 17-80 years who are  at high risk for developing lung cancer because of a history of smoking. A yearly low-dose CT scan of the lungs is recommended for people who have at least a 30-pack-year history of smoking and are a current smoker or have quit within the past 15 years. A pack year of smoking is smoking an average of 1 pack of cigarettes a day for 1 year (for example: 1 pack a day for 30 years or 2 packs a day for 15 years). Yearly screening should continue until the smoker has stopped smoking for at least 15 years. Yearly screening should be stopped for people who develop a health problem that would prevent them from having lung cancer treatment.  Avoid use of street drugs. Do not share needles with anyone. Ask for help if you need support or instructions about stopping the use of drugs.  High blood pressure causes heart disease and increases the risk of stroke.  Ongoing high blood pressure should be treated with medicines if weight loss and exercise do not work.  If you are 55-79  years old, ask your health care provider if you should take aspirin to prevent strokes.  Diabetes screening involves taking a blood sample to check your fasting blood sugar level. This should be done once every 3 years, after age 3, if you are within normal weight and without risk factors for diabetes. Testing should be considered at a younger age or be carried out more frequently if you are overweight and have at least 1 risk factor for diabetes.  Breast cancer screening is essential preventive care for women. You should practice "breast self-awareness." This means understanding the normal appearance and feel of your breasts and may include breast self-examination. Any changes detected, no matter how small, should be reported to a health care provider. Women in their 10s and 30s should have a clinical breast exam (CBE) by a health care provider as part of a regular health exam every 1 to 3 years. After age 67, women should have a CBE every  year. Starting at age 66, women should consider having a mammogram (breast X-ray test) every year. Women who have a family history of breast cancer should talk to their health care provider about genetic screening. Women at a high risk of breast cancer should talk to their health care providers about having an MRI and a mammogram every year.  Breast cancer gene (BRCA)-related cancer risk assessment is recommended for women who have family members with BRCA-related cancers. BRCA-related cancers include breast, ovarian, tubal, and peritoneal cancers. Having family members with these cancers may be associated with an increased risk for harmful changes (mutations) in the breast cancer genes BRCA1 and BRCA2. Results of the assessment will determine the need for genetic counseling and BRCA1 and BRCA2 testing.  Routine pelvic exams to screen for cancer are no longer recommended for nonpregnant women who are considered low risk for cancer of the pelvic organs (ovaries, uterus, and vagina) and who do not have symptoms. Ask your health care provider if a screening pelvic exam is right for you.  If you have had past treatment for cervical cancer or a condition that could lead to cancer, you need Pap tests and screening for cancer for at least 20 years after your treatment. If Pap tests have been discontinued, your risk factors (such as having a new sexual partner) need to be reassessed to determine if screening should be resumed. Some women have medical problems that increase the chance of getting cervical cancer. In these cases, your health care provider may recommend more frequent screening and Pap tests.    Colorectal cancer can be detected and often prevented. Most routine colorectal cancer screening begins at the age of 97 years and continues through age 68 years. However, your health care provider may recommend screening at an earlier age if you have risk factors for colon cancer. On a yearly basis, your health  care provider may provide home test kits to check for hidden blood in the stool. Use of a small camera at the end of a tube, to directly examine the colon (sigmoidoscopy or colonoscopy), can detect the earliest forms of colorectal cancer. Talk to your health care provider about this at age 65, when routine screening begins. Direct exam of the colon should be repeated every 5-10 years through age 81 years, unless early forms of pre-cancerous polyps or small growths are found.  Osteoporosis is a disease in which the bones lose minerals and strength with aging. This can result in serious bone fractures or breaks. The risk of osteoporosis  can be identified using a bone density scan. Women ages 13 years and over and women at risk for fractures or osteoporosis should discuss screening with their health care providers. Ask your health care provider whether you should take a calcium supplement or vitamin D to reduce the rate of osteoporosis.  Menopause can be associated with physical symptoms and risks. Hormone replacement therapy is available to decrease symptoms and risks. You should talk to your health care provider about whether hormone replacement therapy is right for you.  Use sunscreen. Apply sunscreen liberally and repeatedly throughout the day. You should seek shade when your shadow is shorter than you. Protect yourself by wearing long sleeves, pants, a wide-brimmed hat, and sunglasses year round, whenever you are outdoors.  Once a month, do a whole body skin exam, using a mirror to look at the skin on your back. Tell your health care provider of new moles, moles that have irregular borders, moles that are larger than a pencil eraser, or moles that have changed in shape or color.  Stay current with required vaccines (immunizations).  Influenza vaccine. All adults should be immunized every year.  Tetanus, diphtheria, and acellular pertussis (Td, Tdap) vaccine. Pregnant women should receive 1 dose of  Tdap vaccine during each pregnancy. The dose should be obtained regardless of the length of time since the last dose. Immunization is preferred during the 27th-36th week of gestation. An adult who has not previously received Tdap or who does not know her vaccine status should receive 1 dose of Tdap. This initial dose should be followed by tetanus and diphtheria toxoids (Td) booster doses every 10 years. Adults with an unknown or incomplete history of completing a 3-dose immunization series with Td-containing vaccines should begin or complete a primary immunization series including a Tdap dose. Adults should receive a Td booster every 10 years.    Zoster vaccine. One dose is recommended for adults aged 35 years or older unless certain conditions are present.    Pneumococcal 13-valent conjugate (PCV13) vaccine. When indicated, a person who is uncertain of her immunization history and has no record of immunization should receive the PCV13 vaccine. An adult aged 71 years or older who has certain medical conditions and has not been previously immunized should receive 1 dose of PCV13 vaccine. This PCV13 should be followed with a dose of pneumococcal polysaccharide (PPSV23) vaccine. The PPSV23 vaccine dose should be obtained at least 8 weeks after the dose of PCV13 vaccine. An adult aged 63 years or older who has certain medical conditions and previously received 1 or more doses of PPSV23 vaccine should receive 1 dose of PCV13. The PCV13 vaccine dose should be obtained 1 or more years after the last PPSV23 vaccine dose.    Pneumococcal polysaccharide (PPSV23) vaccine. When PCV13 is also indicated, PCV13 should be obtained first. All adults aged 40 years and older should be immunized. An adult younger than age 62 years who has certain medical conditions should be immunized. Any person who resides in a nursing home or long-term care facility should be immunized. An adult smoker should be immunized. People with an  immunocompromised condition and certain other conditions should receive both PCV13 and PPSV23 vaccines. People with human immunodeficiency virus (HIV) infection should be immunized as soon as possible after diagnosis. Immunization during chemotherapy or radiation therapy should be avoided. Routine use of PPSV23 vaccine is not recommended for American Indians, [place]atives, or people younger than 65 years unless there are medical conditions that require  PPSV23 vaccine. When indicated, people who have unknown immunization and have no record of immunization should receive PPSV23 vaccine. One-time revaccination 5 years after the first dose of PPSV23 is recommended for people aged 19-64 years who have chronic kidney failure, nephrotic syndrome, asplenia, or immunocompromised conditions. People who received 1-2 doses of PPSV23 before age 6 years should receive another dose of PPSV23 vaccine at age 81 years or later if at least 5 years have passed since the previous dose. Doses of PPSV23 are not needed for people immunized with PPSV23 at or after age 25 years.   Preventive Services / Frequency  Ages 65 years and over  Blood pressure check.  Lipid and cholesterol check.  Lung cancer screening. / Every year if you are aged 72-80 years and have a 30-pack-year history of smoking and currently smoke or have quit within the past 15 years. Yearly screening is stopped once you have quit smoking for at least 15 years or develop a health problem that would prevent you from having lung cancer treatment.  Clinical breast exam.** / Every year after age 28 years.  BRCA-related cancer risk assessment.** / For women who have family members with a BRCA-related cancer (breast, ovarian, tubal, or peritoneal cancers).  Mammogram.** / Every year beginning at age 67 years and continuing for as long as you are in good health. Consult with your health care provider.  Pap test.** / Every 3 years starting at age 44 years  through age 38 or 31 years with 3 consecutive normal Pap tests. Testing can be stopped between 65 and 70 years with 3 consecutive normal Pap tests and no abnormal Pap or HPV tests in the past 10 years.  Fecal occult blood test (FOBT) of stool. / Every year beginning at age 97 years and continuing until age 31 years. You may not need to do this test if you get a colonoscopy every 10 years.  Flexible sigmoidoscopy or colonoscopy.** / Every 5 years for a flexible sigmoidoscopy or every 10 years for a colonoscopy beginning at age 3 years and continuing until age 60 years.  Hepatitis C blood test.** / For all people born from [birth year] through 1963 and any individual with known risks for hepatitis C.  Osteoporosis screening.** / A one-time screening for women ages 44 years and over and women at risk for fractures or osteoporosis.  Skin self-exam. / Monthly.  Influenza vaccine. / Every year.  Tetanus, diphtheria, and acellular pertussis (Tdap/Td) vaccine.** / 1 dose of Td every 10 years.  Zoster vaccine.** / 1 dose for adults aged 72 years or older.  Pneumococcal 13-valent conjugate (PCV13) vaccine.** / Consult your health care provider.  Pneumococcal polysaccharide (PPSV23) vaccine.** / 1 dose for all adults aged 64 years and older. Screening for abdominal aortic aneurysm (AAA)  by ultrasound is recommended for people who have history of high blood pressure or who are current or former smokers.

## 2015-07-21 NOTE — Progress Notes (Signed)
Patient ID: Monica Curry, female   DOB: 06/18/1931, 81 y.o.   MRN: [id number]  MEDICARE ANNUAL WELLNESS VISIT AND OV  Assessment:   1. Essential hypertension  - TSH  2. Hyperlipidemia  - Lipid panel  3. Prediabetes  - Hemoglobin A1c - Insulin, random  4. Vitamin D deficiency  - Vit D  25 hydroxy   5. Essential tremor   6. Osteoporosis   7. Depression screen   8. At low risk for fall   9.  HT CKD 3 (GFR 11ml/min)   10. Medication management  - CBC with Differential/Platelet - BASIC METABOLIC PANEL WITH GFR - Hepatic function panel - Magnesium  11. Body mass index (BMI) of 23.0-23.9 in adult  Plan:   During the course of the visit the patient was educated and counseled about appropriate screening and preventive services including:    Pneumococcal vaccine   Influenza vaccine  Td vaccine  Screening electrocardiogram  Bone densitometry screening  Colorectal cancer screening  Diabetes screening  Glaucoma screening  Nutrition counseling   Advanced directives: requested  Screening recommendations, referrals: Vaccinations:  Immunization History  Administered Date(s) Administered  . H1N1 01/03/2009  . Influenza Whole 09/23/2013  . Influenza, High Dose Seasonal PF 09/06/2014  . PPD Test 03/29/2014  . Pneumococcal Polysaccharide-23 03/29/2014  . Pneumococcal-Unspecified 12/09/2002  . Td 03/23/2013  . Zoster 12/09/2006  Hep B vaccine not indicated  Nutrition assessed and recommended  Colonoscopy 2011 Recommended yearly ophthalmology/optometry visit for glaucoma screening and checkup Recommended yearly dental visit for hygiene and checkup Advanced directives - yes  Conditions/risks identified: BMI: Discussed weight loss, diet, and increase physical activity.  Increase physical activity: AHA recommends 150 minutes of physical activity a week.  Medications reviewed PreDiabetes is at goal, ACE/ARB therapy: Not Indicated  Urinary  Incontinence is not an issue: discussed non pharmacology and pharmacology options.  Fall risk: low- discussed PT, home fall assessment, medications.   Subjective:    Monica Curry presents for [employer] Visit and OV.  Date of last medicare wellness visit 03/29/2014.  This very nice 81 y.o.  MWF presents also for  follow up with Hypertension, Hyperlipidemia, Pre-Diabetes and Vitamin D Deficiency.    Patient has mild labile HTN or "White coat HTN" and is monitored expectantly. BP has been controlled and today's BP: 122/72 mmHg. Patient has had no complaints of any cardiac type chest pain, palpitations, dyspnea/orthopnea/PND, dizziness, claudication, or dependent edema.     Hyperlipidemia is controlled with diet.  Last Lipids were not at goal -  Cholesterol 187; HDL 41*; LDL 108*; Triglycerides 192 on 04/19/2015.     Also, the patient has history of PreDiabetes and has had no symptoms of reactive hypoglycemia, diabetic polys, paresthesias or visual blurring.  Last A1c was  5.7% on 04/19/2015.       Further, the patient also has history of Vitamin D Deficiency and supplements vitamin D without any suspected side-effects. Last vitamin D was 50 on 04/19/2015.   Names of Other Physician/Practitioners you currently use: 1. [facility] here for primary care 2. Dr Rutherford Guys , eye doctor, last visit 2016 3. Dr Ennis Forts, [place], dentist, last visit Feb 2016 & every 4 months  Patient Care Team: Unk Pinto, MD as PCP - General (Internal Medicine) Ronald Lobo, MD as Consulting Physician (Gastroenterology) Rutherford Guys, MD as Consulting Physician (Ophthalmology) Kathee Delton, MD as Consulting Physician (Pulmonary Disease) Anastasio Auerbach, MD as Consulting Physician (Gynecology) Alyson Locket  Erling Cruz, MD as Consulting Physician (Neurology) [name], MD as Attending Physician (Neurology) Harriett Sine, MD as Consulting Physician  (Dermatology)  Medication Review: Medication Sig  . aspirin 81 MG tablet Take 81 mg by mouth daily.  . Calcium-Magnesium-Vitamin D [phone] MG-MG-UNIT TABS Take by mouth daily.  . Cholecalciferol (VITAMIN D PO) Take 2,000 Units by mouth 2 (two) times daily.   . clonazePAM (KLONOPIN) 0.5 MG tablet Take 1 pill in AM, 1/2 pill at noon and 1/2 pill at night.  . Lactobacillus (PROBIOTIC ACIDOPHILUS) TABS Take by mouth daily.  . propranolol (INDERAL) 20 MG tablet Take 1 tablet (20 mg total) by mouth 3 (three) times daily.   Allergies  Allergen Reactions  . Codeine   . Mysoline [Primidone]   . Pneumovax 23 [Pneumococcal Vac Polyvalent]     Red, raised area.  Knot at site of injection   Current Problems (verified) Patient Active Problem List   Diagnosis Date Noted  .  HT CKD 3 (GFR 61ml/min) 07/21/2015  . Medication management 07/04/2014  . Essential hypertension 11/25/2013  . Hyperlipidemia 11/25/2013  . Prediabetes 11/25/2013  . Vitamin D deficiency 11/25/2013  . Essential tremor 11/25/2013  . Endometrial cancer   . Hx/o DVT/PE   . Urethral prolapse   . Osteoporosis 10/10/2011    Screening Tests Health Maintenance  Topic Date Due  . PNA vac Low Risk Adult (2 of 2 - PCV13) 03/30/2015  . INFLUENZA VACCINE  07/10/2015  . COLONOSCOPY  12/10/2019  . TETANUS/TDAP  03/24/2023  . DEXA SCAN  Completed  . ZOSTAVAX  Completed    Immunization History  Administered Date(s) Administered  . H1N1 01/03/2009  . Influenza Whole 09/23/2013  . Influenza, High Dose Seasonal PF 09/06/2014  . PPD Test 03/29/2014  . Pneumococcal Polysaccharide-23 03/29/2014  . Pneumococcal-Unspecified 12/09/2002  . Td 03/23/2013  . Zoster 12/09/2006    Preventative care: Last colonoscopy: 2011  Past Medical History  Diagnosis Date  . Personal history of venous thrombosis and embolism 1997  . Essential and other specified forms of tremor   . Cancer     Endometrial cancer  . Osteoporosis 11/2013     T score -2.4 prior in 2000 -3.3, T9 compression fracture indeterminate duration  . Urethral prolapse   . C. difficile colitis   . Thrush    Past Surgical History  Procedure Laterality Date  . Cesarean section  1961  . Abdominal hysterectomy  1997  . Tonsilectomy/adenoidectomy with myringotomy  1944  . Oophorectomy      BSO    Risk Factors: Tobacco Social History  Substance Use Topics  . Smoking status: Never Smoker   . Smokeless tobacco: None  . Alcohol Use: 0.0 oz/week    0 Standard drinks or equivalent per week     Comment: ocassionally   She does not smoke.  Patient is not a former smoker. Are there smokers in your home (other than you)?  No Alcohol Current alcohol use: occasional  Caffeine Current caffeine use: denies use  Exercise Current exercise: gardening, housecleaning, walking and yard work  Nutrition/Diet Current diet: in general, a "healthy" diet    Cardiac risk factors: none.  Depression Screen (Note: if answer to either of the following is "Yes", a more complete depression screening is indicated)   Q1: Over the past two weeks, have you felt down, depressed or hopeless? No  Q2: Over the past two weeks, have you felt little interest or pleasure in doing things? No  Have  you lost interest or pleasure in daily life? No  Do you often feel hopeless? No  Do you cry easily over simple problems? No  Activities of Daily Living In your present state of health, do you have any difficulty performing the following activities?:  Driving? No Managing money?  No Feeding yourself? No Getting from bed to chair? No Climbing a flight of stairs? No Preparing food and eating?: No Bathing or showering? No Getting dressed: No Getting to the toilet? No Using the toilet:No Moving around from place to place: No In the past year have you fallen or had a near fall?:No   Are you sexually active?  No  Do you have more than one partner?  No  Vision  Difficulties: No  Hearing Difficulties: No Do you often ask people to speak up or repeat themselves? No Do you experience ringing or noises in your ears? No Do you have difficulty understanding soft or whispered voices? Sometimes.  Cognition  Do you feel that you have a problem with memory?No  Do you often misplace items? No  Do you feel safe at home?  Yes  Advanced directives Does patient have a [place]? Yes Does patient have a Living Will? Yes  ROS: Constitutional: Denies fever, chills, weight loss/gain, headaches, insomnia, fatigue, night sweats, and change in appetite. Eyes: Denies redness, blurred vision, diplopia, discharge, itchy, watery eyes.  ENT: Denies discharge, congestion, post nasal drip, epistaxis, sore throat, earache, hearing loss, dental pain, Tinnitus, Vertigo, Sinus pain, snoring.  Cardio: Denies chest pain, palpitations, irregular heartbeat, syncope, dyspnea, diaphoresis, orthopnea, PND, claudication, edema Respiratory: denies cough, dyspnea, DOE, pleurisy, hoarseness, laryngitis, wheezing.  Gastrointestinal: Denies dysphagia, heartburn, reflux, water brash, pain, cramps, nausea, vomiting, bloating, diarrhea, constipation, hematemesis, melena, hematochezia, jaundice, hemorrhoids Genitourinary: Denies dysuria, frequency, urgency, nocturia, hesitancy, discharge, hematuria, flank pain Breast: Breast lumps, nipple discharge, bleeding.  Musculoskeletal: Denies arthralgia, myalgia, stiffness, Jt. Swelling, pain, limp, and strain/sprain. Denies falls. Skin: Denies puritis, rash, hives, warts, acne, eczema, changing in skin lesion Neuro: No weakness, tremor, incoordination, spasms, paresthesia, pain Psychiatric: Denies confusion, memory loss, sensory loss. Denies Depression. Endocrine: Denies change in weight, skin, hair change, nocturia, and paresthesia, diabetic polys, visual blurring, hyper / hypo glycemic episodes.  Heme/Lymph: No excessive  bleeding, bruising, enlarged lymph nodes  Objective:     BP 122/72   Pulse 64  Temp97.5 F   Resp 16  Ht 5' 4.75"   Wt 140 lb     BMI 23.47   General Appearance: Well nourished, alert, WD/WN, female and in no apparent distress. Eyes: PERRLA, EOMs, conjunctiva no swelling or erythema, normal fundi and vessels. Sinuses: No frontal/maxillary tenderness ENT/Mouth: EACs patent / TMs  nl. Nares clear without erythema, swelling, mucoid exudates. Oral hygiene is good. No erythema, swelling, or exudate. Tongue normal, non-obstructing. Tonsils not swollen or erythematous. Hearing normal.  Neck: Supple, thyroid normal. No bruits, nodes or JVD. Respiratory: Respiratory effort normal.  BS equal and clear bilateral without rales, rhonci, wheezing or stridor. Cardio: Heart sounds are normal with regular rate and rhythm and no murmurs, rubs or gallops. Peripheral pulses are normal and equal bilaterally without edema. No aortic or femoral bruits. Chest: symmetric with normal excursions and percussion. Abdomen: Flat, soft  with nl bowel sounds. Nontender, no guarding, rebound, hernias, masses, or organomegaly.  Lymphatics: Non tender without lymphadenopathy.  Musculoskeletal: Full ROM all peripheral extremities, joint stability, 5/5 strength, and normal gait. Skin: Warm and dry without rashes, lesions, cyanosis,  clubbing or  ecchymosis.  Neuro: Cranial nerves intact, reflexes equal bilaterally. Normal muscle tone, no cerebellar symptoms. Sensation intact.  Pysch: Alert and oriented X 3, normal affect, Insight and Judgment appropriate.   Cognitive Testing  Alert? Yes  Normal Appearance?Yes  Oriented to person? Yes  Place? Yes   Time? Yes  Recall of three objects?  Yes  Can perform simple calculations? Yes  Displays appropriate judgment? Yes  Can read the correct time from a watch/clock?Yes  Medicare Attestation I have personally reviewed: The patient's medical and social history Their use of  alcohol, tobacco or illicit drugs Their current medications and supplements The patient's functional ability including ADLs,fall risks, home safety risks, cognitive, and hearing and visual impairment Diet and physical activities Evidence for depression or mood disorders  The patient's weight, height, BMI, and visual acuity have been recorded in the chart.  I have made referrals, counseling, and provided education to the patient based on review of the above and I have provided the patient with a written personalized care plan for preventive services.  Over 40 minutes of exam, counseling, chart review was performed.   Avielle Imbert DAVID, MD   07/21/2015

## 2015-07-22 LAB — BASIC METABOLIC PANEL WITH GFR
BUN: 20 mg/dL (ref 7–25)
CO2: 27 mmol/L (ref 20–31)
CREATININE: 1.04 mg/dL — AB (ref 0.60–0.88)
Calcium: 9.3 mg/dL (ref 8.6–10.4)
Chloride: 101 mmol/L (ref 98–110)
GFR, EST AFRICAN AMERICAN: 58 mL/min — AB (ref >=60)
GFR, EST NON AFRICAN AMERICAN: 51 mL/min — AB (ref >=60)
Glucose, Bld: 76 mg/dL (ref 65–99)
Potassium: 4.4 mmol/L (ref 3.5–5.3)
Sodium: 140 mmol/L (ref 135–146)

## 2015-07-22 LAB — HEPATIC FUNCTION PANEL
ALBUMIN: 4.1 g/dL (ref 3.6–5.1)
ALT: 21 U/L (ref 6–29)
AST: 21 U/L (ref 10–35)
Alkaline Phosphatase: 36 U/L (ref 33–130)
Bilirubin, Direct: 0.1 mg/dL (ref <=0.2)
Indirect Bilirubin: 0.5 mg/dL (ref 0.2–1.2)
TOTAL PROTEIN: 6.8 g/dL (ref 6.1–8.1)
Total Bilirubin: 0.6 mg/dL (ref 0.2–1.2)

## 2015-07-22 LAB — CBC WITH DIFFERENTIAL/PLATELET
Basophils Absolute: 0 10*3/uL (ref 0.0–0.1)
Basophils Relative: 1 % (ref 0–1)
Eosinophils Absolute: 0.2 10*3/uL (ref 0.0–0.7)
Eosinophils Relative: 4 % (ref 0–5)
HEMATOCRIT: 43.6 % (ref 36.0–46.0)
HEMOGLOBIN: 14.3 g/dL (ref 12.0–15.0)
LYMPHS ABS: 2.2 10*3/uL (ref 0.7–4.0)
LYMPHS PCT: 46 % (ref 12–46)
MCH: 28.7 pg (ref 26.0–34.0)
MCHC: 32.8 g/dL (ref 30.0–36.0)
MCV: 87.6 fL (ref 78.0–100.0)
MONOS PCT: 9 % (ref 3–12)
MPV: 9.5 fL (ref 8.6–12.4)
Monocytes Absolute: 0.4 10*3/uL (ref 0.1–1.0)
NEUTROS PCT: 40 % — AB (ref 43–77)
Neutro Abs: 1.9 10*3/uL (ref 1.7–7.7)
Platelets: 225 10*3/uL (ref 150–400)
RBC: 4.98 MIL/uL (ref 3.87–5.11)
RDW: 14.8 % (ref 11.5–15.5)
WBC: 4.8 10*3/uL (ref 4.0–10.5)

## 2015-07-22 LAB — HEMOGLOBIN A1C
Hgb A1c MFr Bld: 5.7 % — ABNORMAL HIGH (ref <5.7)
MEAN PLASMA GLUCOSE: 117 mg/dL — AB (ref <117)

## 2015-07-22 LAB — MAGNESIUM: Magnesium: 1.9 mg/dL (ref 1.5–2.5)

## 2015-07-22 LAB — LIPID PANEL
CHOLESTEROL: 177 mg/dL (ref 125–200)
HDL: 43 mg/dL — AB (ref >=46)
LDL Cholesterol: 103 mg/dL (ref <130)
Total CHOL/HDL Ratio: 4.1 Ratio (ref <=5.0)
Triglycerides: 153 mg/dL — ABNORMAL HIGH (ref <150)
VLDL: 31 mg/dL — ABNORMAL HIGH (ref <30)

## 2015-07-22 LAB — INSULIN, RANDOM: Insulin: 23.8 u[IU]/mL — ABNORMAL HIGH (ref 2.0–19.6)

## 2015-07-22 LAB — VITAMIN D 25 HYDROXY (VIT D DEFICIENCY, FRACTURES): Vit D, 25-Hydroxy: 44 ng/mL (ref 30–100)

## 2015-08-23 ENCOUNTER — Ambulatory Visit (INDEPENDENT_AMBULATORY_CARE_PROVIDER_SITE_OTHER): Payer: Medicare Other

## 2015-08-23 DIAGNOSIS — Z23 Encounter for immunization: Secondary | ICD-10-CM

## 2015-10-16 ENCOUNTER — Other Ambulatory Visit: Payer: Self-pay | Admitting: Gynecology

## 2015-10-16 DIAGNOSIS — M810 Age-related osteoporosis without current pathological fracture: Secondary | ICD-10-CM

## 2015-10-27 ENCOUNTER — Ambulatory Visit (INDEPENDENT_AMBULATORY_CARE_PROVIDER_SITE_OTHER): Payer: Medicare Other | Admitting: Internal Medicine

## 2015-10-27 ENCOUNTER — Encounter: Payer: Self-pay | Admitting: Internal Medicine

## 2015-10-27 VITALS — BP 122/70 | HR 61 | Temp 97.0°F | Resp 14 | Ht 64.5 in | Wt 142.0 lb

## 2015-10-27 DIAGNOSIS — E782 Mixed hyperlipidemia: Secondary | ICD-10-CM

## 2015-10-27 DIAGNOSIS — E559 Vitamin D deficiency, unspecified: Secondary | ICD-10-CM | POA: Diagnosis not present

## 2015-10-27 DIAGNOSIS — I10 Essential (primary) hypertension: Secondary | ICD-10-CM

## 2015-10-27 DIAGNOSIS — Z79899 Other long term (current) drug therapy: Secondary | ICD-10-CM

## 2015-10-27 DIAGNOSIS — R7303 Prediabetes: Secondary | ICD-10-CM

## 2015-10-27 NOTE — Progress Notes (Signed)
Patient ID: Rivkah Baye, female   DOB: 25-Feb-1931, 81 y.o.   MRN: [id number]  Assessment and Plan:  Hypertension:  -Continue medication,  -monitor blood pressure at home.  -Continue DASH diet.   -Reminder to go to the ER if any CP, SOB, nausea, dizziness, severe HA, changes vision/speech, left arm numbness and tingling, and jaw pain.  Cholesterol: -Continue diet and exercise.   Pre-diabetes: -Continue diet and exercise.    Vitamin D Def:  -continue medications.   Patient not interested in labs today and would like to go to every six month visits.    Continue diet and meds as discussed. Further disposition pending results of labs.  HPI 81 y.o. female  presents for 3 month follow up with hypertension, hyperlipidemia, prediabetes and vitamin D.   Her blood pressure has been controlled at home, today [name] is BP: 122/70 mmHg.   She does workout. She denies chest pain, shortness of breath, dizziness.   She is on cholesterol medication and denies myalgias. Her cholesterol is at goal. The cholesterol last visit was:   Lab Results  Component Value Date   CHOL 177 07/21/2015   HDL 43* 07/21/2015   LDLCALC 103 07/21/2015   TRIG 153* 07/21/2015   CHOLHDL 4.1 07/21/2015     She has been working on diet and exercise for prediabetes, and denies foot ulcerations, hyperglycemia, hypoglycemia , increased appetite, nausea, paresthesia of the feet, polydipsia, polyuria, visual disturbances, vomiting and weight loss. Last A1C in the office was:  Lab Results  Component Value Date   HGBA1C 5.7* 07/21/2015    Patient is on Vitamin D supplement.  Lab Results  Component Value Date   VD25OH 44 07/21/2015      She reports that she has been taken off of both her estrogen and her osteoporosis medications.  She reports that since she has been off her medications.  She is doing better with bowel movements.  She is no longer having issues with diarrhea.  She is concerned that she has not  gained back the weight that she lost from the C. Diff bout.    Current Medications:  Current Outpatient Prescriptions on File Prior to Visit  Medication Sig Dispense Refill  . aspirin 81 MG tablet Take 81 mg by mouth daily.    . Calcium-Magnesium-Vitamin D [id number] MG-MG-UNIT TABS Take by mouth daily.    . Cholecalciferol (VITAMIN D PO) Take 2,000 Units by mouth 2 (two) times daily.     . clonazePAM (KLONOPIN) 0.5 MG tablet Take 1 pill in AM, 1/2 pill at noon and 1/2 pill at night. 60 tablet 5  . Lactobacillus (PROBIOTIC ACIDOPHILUS) TABS Take by mouth daily.    . propranolol (INDERAL) 20 MG tablet Take 1 tablet (20 mg total) by mouth 3 (three) times daily. 90 tablet 5   No current facility-administered medications on file prior to visit.    Medical History:  Past Medical History  Diagnosis Date  . Personal history of venous thrombosis and embolism 1997  . Essential and other specified forms of tremor   . Cancer [facility])     Endometrial cancer  . Osteoporosis 11/2013    T score -2.4 prior in 2000 -3.3, T9 compression fracture indeterminate duration  . Urethral prolapse   . C. difficile colitis   . Thrush     Allergies:  Allergies  Allergen Reactions  . Codeine   . Mysoline [Primidone]   . Pneumovax 23 [Pneumococcal Vac Polyvalent]  Red, raised area.  Knot at site of injection     Review of Systems:  Review of Systems  Constitutional: Negative for fever, chills and malaise/fatigue.  HENT: Negative for congestion, ear pain and sore throat.   Eyes: Negative.   Respiratory: Negative for cough, shortness of breath and wheezing.   Cardiovascular: Negative for chest pain, palpitations and leg swelling.  Gastrointestinal: Negative for diarrhea, constipation, blood in stool and melena.  Genitourinary: Negative.   Skin: Negative.   Neurological: Negative for dizziness, sensory change, loss of consciousness and headaches.  Psychiatric/Behavioral: Negative for depression. The  patient is not nervous/anxious and does not have insomnia.     Family history- Review and unchanged  Social history- Review and unchanged  Physical Exam: BP 122/70 mmHg  Pulse 61  Temp(Src) 97 F (36.1 C) (Temporal)  Resp 14  Ht 5' 4.5" (1.638 m)  Wt 142 lb (64.411 kg)  BMI 24.01 kg/m2  SpO2 98% Wt Readings from Last 3 Encounters:  10/27/15 142 lb (64.411 kg)  07/21/15 140 lb (63.504 kg)  07/03/15 140 lb (63.504 kg)    General Appearance: Well nourished well developed, in no apparent distress. Eyes: PERRLA, EOMs, conjunctiva no swelling or erythema ENT/Mouth: Ear canals normal without obstruction, swelling, erythma, discharge.  TMs normal bilaterally.  Oropharynx moist, clear, without exudate, or postoropharyngeal swelling. Neck: Supple, thyroid normal,no cervical adenopathy  Respiratory: Respiratory effort normal, Breath sounds clear A&P without rhonchi, wheeze, or rale.  No retractions, no accessory usage. Cardio: RRR with no MRGs. Brisk peripheral pulses without edema.  Abdomen: Soft, + BS,  Non tender, no guarding, rebound, hernias, masses. Musculoskeletal: Full ROM, 5/5 strength, Normal gait Skin: Warm, dry without rashes, lesions, ecchymosis.  Neuro: Awake and oriented X 3, Cranial nerves intact. Normal muscle tone, no cerebellar symptoms. Psych: Normal affect, Insight and Judgment appropriate.    Starlyn Skeans, PA-C 11:03 AM [facility]

## 2015-10-30 ENCOUNTER — Encounter: Payer: Self-pay | Admitting: Gynecology

## 2015-10-30 ENCOUNTER — Ambulatory Visit (INDEPENDENT_AMBULATORY_CARE_PROVIDER_SITE_OTHER): Payer: Medicare Other | Admitting: Gynecology

## 2015-10-30 ENCOUNTER — Other Ambulatory Visit (HOSPITAL_COMMUNITY)
Admission: RE | Admit: 2015-10-30 | Discharge: 2015-10-30 | Disposition: A | Discharge status: Home / Self Care | Payer: Medicare Other | Source: Ambulatory Visit | Attending: Gynecology | Admitting: Gynecology

## 2015-10-30 VITALS — BP 112/70 | Ht 64.0 in | Wt 142.0 lb

## 2015-10-30 DIAGNOSIS — Z01419 Encounter for gynecological examination (general) (routine) without abnormal findings: Secondary | ICD-10-CM | POA: Diagnosis not present

## 2015-10-30 DIAGNOSIS — Z1272 Encounter for screening for malignant neoplasm of vagina: Secondary | ICD-10-CM | POA: Diagnosis not present

## 2015-10-30 DIAGNOSIS — N952 Postmenopausal atrophic vaginitis: Secondary | ICD-10-CM

## 2015-10-30 DIAGNOSIS — C541 Malignant neoplasm of endometrium: Secondary | ICD-10-CM | POA: Diagnosis not present

## 2015-10-30 DIAGNOSIS — M810 Age-related osteoporosis without current pathological fracture: Secondary | ICD-10-CM | POA: Diagnosis not present

## 2015-10-30 NOTE — Patient Instructions (Signed)
Followup for bone density as scheduled. 

## 2015-10-30 NOTE — Progress Notes (Signed)
Monica Curry [id number]        81 y.o.  G2P2001  No LMP recorded. Patient has had a hysterectomy. for breast and pelvic exam. Several issues noted below.  Past medical history,surgical history, problem list, medications, allergies, family history and social history were all reviewed and documented as reviewed in the EPIC chart.  ROS:  Performed with pertinent positives and negatives included in the history, assessment and plan.   Additional significant findings :  none   Exam: Kim Counsellor Vitals:   10/30/15 1048  BP: 112/70  Height: 5\' 4"  (1.626 m)  Weight: 142 lb (64.411 kg)   General appearance:  Normal affect, orientation and appearance. Skin: Grossly normal HEENT: Without gross lesions.  No cervical or supraclavicular adenopathy. Thyroid normal.  Lungs:  Clear without wheezing, rales or rhonchi Cardiac: RR, without RMG Abdominal:  Soft, nontender, without masses, guarding, rebound, organomegaly or hernia Breasts:  Examined lying and sitting without masses, retractions, discharge or axillary adenopathy. Pelvic:  Ext/BUS/vagina with atrophic changes. Pap smear of cuff done. Mild urethral prolapse noted  Adnexa  Without masses or tenderness    Anus and perineum  Normal   Rectovaginal  Normal sphincter tone without palpated masses or tenderness.    Assessment/Plan:  81 y.o. G61P2001 female for breast and pelvic exam.   1. History of well-differentiated endometrial adenocarcinoma. Primarily in situ with focal minimal myometrial invasion (2 mm) 1997. Status post TAH/BSO. Exam NED. Pap smear of cuff done. 2. Osteoporosis. DEXA 11/2013 T score -2.4. Prior DEXA 2000 T score -3.3. History of bisphosphonate use for over 10 years. Discontinued last year. Plan repeat DEXA now a 2 year interval. Patient has scheduled and will follow up for this. 3. Postmenopausal/atrophic genital changes. Without significant hot flushes, night sweats or vaginal dryness. History of  Premarin cream use previously but discontinued doing well. 4. Mild urethral prolapse. Stable on serial exams. A symptomatically the patient. 5. Mammography scheduled and patient will follow up for this. SBE monthly reviewed. 6. Colonoscopy 2012. Repeat at their recommended interval. 7. Health maintenance. No routine lab work done as this is done at her primary physician's office. Follow up 1 year, sooner as needed.   Anastasio Auerbach MD, 11:24 AM 10/30/2015

## 2015-10-30 NOTE — Addendum Note (Signed)
Addended by: Nelva Nay on: 10/30/2015 12:47 PM   Modules accepted: Orders

## 2015-11-01 LAB — CYTOLOGY - PAP

## 2015-11-09 DIAGNOSIS — M810 Age-related osteoporosis without current pathological fracture: Secondary | ICD-10-CM

## 2015-11-09 HISTORY — DX: Age-related osteoporosis without current pathological fracture: M81.0

## 2015-11-10 ENCOUNTER — Encounter: Payer: Self-pay | Admitting: Gynecology

## 2015-11-16 ENCOUNTER — Ambulatory Visit (INDEPENDENT_AMBULATORY_CARE_PROVIDER_SITE_OTHER): Payer: Medicare Other

## 2015-11-16 DIAGNOSIS — M810 Age-related osteoporosis without current pathological fracture: Secondary | ICD-10-CM

## 2015-11-17 ENCOUNTER — Encounter: Payer: Self-pay | Admitting: Gynecology

## 2015-11-17 ENCOUNTER — Telehealth: Payer: Self-pay | Admitting: Gynecology

## 2015-11-17 NOTE — Telephone Encounter (Signed)
Tell patient her bone density is stable from her prior study. Given her prior treatment history, would recommend following for now with repeat in 2 years.

## 2015-11-17 NOTE — Telephone Encounter (Signed)
Pt informed with the below note. 

## 2015-12-28 ENCOUNTER — Ambulatory Visit (INDEPENDENT_AMBULATORY_CARE_PROVIDER_SITE_OTHER): Payer: Medicare Other | Admitting: Gynecology

## 2015-12-28 ENCOUNTER — Encounter: Payer: Self-pay | Admitting: Gynecology

## 2015-12-28 VITALS — BP 116/70

## 2015-12-28 DIAGNOSIS — M810 Age-related osteoporosis without current pathological fracture: Secondary | ICD-10-CM

## 2015-12-28 NOTE — Progress Notes (Signed)
Monica Curry [id number]        82 y.o.  G2P2001 Presents to discuss her most recent bone density showing osteoporosis T score -2.8. History of significant osteoporosis in the past with initial DEXA T score is in the -3 to -4 range. Has been on bisphosphonates for over 10 years discontinued last year.  When compared to her last DEXA 2014 there is not a statistically significant change in the spine, total right and total left hips.  Past medical history,surgical history, problem list, medications, allergies, family history and social history were all reviewed and documented in the EPIC chart.  Directed ROS with pertinent positives and negatives documented in the history of present illness/assessment and plan.  Exam: Filed Vitals:   12/28/15 0957  BP: 116/70   General appearance:  Normal  Assessment/Plan:  82 y.o. G2P2001 with overall stable osteoporosis on one year drug-free holiday after over 10 years of bisphosphonate use. She did show some decline at the femoral necks but not in the total hips. Of note the values used from 2014 were recalibrated to better match current views. I reviewed with the patient the options to either reinitiate treatment or to continue with the drug-free holiday. The pros/cons of each choice discussed to include the issues of prolonged treatment and possible associated atypical fractures. After a lengthy discussion the patient refers no treatment at this time and will repeat her bone density in 2 years which I think is reasonable.    Anastasio Auerbach MD, 10:21 AM 12/28/2015

## 2015-12-28 NOTE — Patient Instructions (Signed)
Osteoporosis  Osteoporosis is the thinning and loss of density in the bones. Osteoporosis makes the bones more brittle, fragile, and likely to break (fracture). Over time, osteoporosis can cause the bones to become so weak that they fracture after a simple fall. The bones most likely to fracture are the bones in the hip, wrist, and spine.  CAUSES   The exact cause is not known.  RISK FACTORS  Anyone can develop osteoporosis. You may be at greater risk if you have a family history of the condition or have poor nutrition. You may also have a higher risk if you are:   · Female.    · 50 years old or older.  · A smoker.  · Not physically active.    · White or Asian.  · Slender.  SIGNS AND SYMPTOMS   A fracture might be the first sign of the disease, especially if it results from a fall or injury that would not usually cause a bone to break. Other signs and symptoms include:   · Low back and neck pain.  · Stooped posture.  · Height loss.  DIAGNOSIS   To make a diagnosis, your health care provider may:  · Take a medical history.  · Perform a physical exam.  · Order tests, such as:    A bone mineral density test.    A dual-energy X-ray absorptiometry test.  TREATMENT   The goal of osteoporosis treatment is to strengthen your bones to reduce your risk of a fracture. Treatment may involve:  · Making lifestyle changes, such as:    Eating a diet rich in calcium.    Doing weight-bearing and muscle-strengthening exercises.    Stopping tobacco use.    Limiting alcohol intake.  · Taking medicine to slow the process of bone loss or to increase bone density.  · Monitoring your levels of calcium and vitamin D.  HOME CARE INSTRUCTIONS  · Include calcium and vitamin D in your diet. Calcium is important for bone health, and vitamin D helps the body absorb calcium.  · Perform weight-bearing and muscle-strengthening exercises as directed by your health care provider.  · Do not use any tobacco products, including cigarettes, chewing  tobacco, and electronic cigarettes. If you need help quitting, ask your health care provider.  · Limit your alcohol intake.  · Take medicines only as directed by your health care provider.  · Keep all follow-up visits as directed by your health care provider. This is important.  · Take precautions at home to lower your risk of falling, such as:    Keeping rooms well lit and clutter free.    Installing safety rails on stairs.    Using rubber mats in the bathroom and other areas that are often wet or slippery.  SEEK IMMEDIATE MEDICAL CARE IF:   You fall or injure yourself.      This information is not intended to replace advice given to you by your health care provider. Make sure you discuss any questions you have with your health care provider.     Document Released: 09/04/2005 Document Revised: 12/16/2014 Document Reviewed: 05/05/2014  Elsevier Interactive Patient Education ©2016 [employer]

## 2016-01-03 ENCOUNTER — Telehealth: Payer: Self-pay

## 2016-01-03 NOTE — Telephone Encounter (Signed)
LM for patient to call back and reschedule appt, Dr. Rexene Alberts is out sick. Patient can be rescheduled with Dr. Rexene Alberts or NPs.

## 2016-01-04 ENCOUNTER — Ambulatory Visit: Payer: Medicare Other | Admitting: Neurology

## 2016-01-08 ENCOUNTER — Encounter: Payer: Self-pay | Admitting: Neurology

## 2016-01-08 ENCOUNTER — Ambulatory Visit (INDEPENDENT_AMBULATORY_CARE_PROVIDER_SITE_OTHER): Payer: Medicare Other | Admitting: Neurology

## 2016-01-08 VITALS — BP 112/68 | HR 76 | Resp 14 | Ht 64.25 in | Wt 143.0 lb

## 2016-01-08 DIAGNOSIS — G250 Essential tremor: Secondary | ICD-10-CM | POA: Diagnosis not present

## 2016-01-08 DIAGNOSIS — F419 Anxiety disorder, unspecified: Secondary | ICD-10-CM

## 2016-01-08 MED ORDER — CLONAZEPAM 0.5 MG PO TABS: ORAL_TABLET | ORAL | Status: DC

## 2016-01-08 MED ORDER — PROPRANOLOL HCL 20 MG PO TABS: 20.0000 mg | ORAL_TABLET | Freq: Three times a day (TID) | ORAL | Status: DC

## 2016-01-08 NOTE — Patient Instructions (Addendum)
  Please remember, that any kind of tremor may be exacerbated by anxiety, anger, nervousness, excitement, dehydration, sleep deprivation, by caffeine, and low blood sugar values or blood sugar fluctuations. Some medications, especially some antidepressants and lithium can cause or exacerbate tremors. Tremors may temporarily calm down or subside with the use of a benzodiazepine such as Valium or related medications and with alcohol. Be aware however that drinking alcohol is not an approved treatment or appropriate treatment for tremor control and long-term use of benzodiazepines such as Valium, lorazepam, alprazolam, or clonazepam can cause habit formation, physical and psychological addiction.  Your tremor can definitely get worse with time.   Unfortunately, there is not a whole lot we can do about it.

## 2016-01-08 NOTE — Progress Notes (Signed)
Subjective:    Patient ID: Monica Curry is a 82 y.o. female.  HPI     Interim history:   Monica Curry is a very pleasant 82 year old right-handed woman with an underlying medical history of uterine cancer, anxiety and PE. She is status post hysterectomy and tonsillectomy, who presents for followup consultation of her essential tremor. She is unaccompanied today. I last saw her on 07/03/2015, at which time she reported having had C. 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 Tremor wise she felt stable and had no side effects on her medications. I suggested she continue with the same doses of propranolol and low-dose clonazepam.  Today, 01/08/2016: She reports having been stable for the most part, but has noted more trouble with signing, and handwriting. She notices worse tremors with stress and anxiety.   Previously:  I saw her on 01/02/2015, at which time she reported doing well. She had tried the propranolol long-acting at one time in the past, but it did not work as well. She was prescribed trazodone 150 mg by her PCP. She tried 1/2 pill for about 2 weeks, but it did not help and made her drowsy during the day. She tried melatonin with no significant success, and she bought a white noise machine and stopped the melatonin. She had a reaction to Pneumovax in 2015 and did take the flu shot. I suggested she continue low-dose clonazepam and kept her on Inderal generic 20 mg 3 times a day.  I saw her on 06/30/14, at which time she reported doing well fro the tremor standpoint. She was having trouble going to sleep and staying asleep. She was worried about her brother who had heart surgery and was in the hospital. I kept her on propranolol and clonazepam.  I saw her on 12/31/2013, at which time I felt she was stable. I did not change her  medications. I suggested a routine six-month checkup. She reported volunteering at the [facility]. She felt stable. She reported having a cousin with Parkinson's disease and one aunt with voice tremor. She had a bone density scan on 10/14/13 and it showed stable findings.   I first met her on 07/02/2013, at which time I felt her exam was stable and it did not change her medications. I had reviewed recent blood work and did not have any new test at the time. I renewed her prescriptions for propranolol and clonazepam at the time.   She previously followed with Dr. Morene Antu and last saw him on 12/31/2012, and which time he started her on propranolol.   She felt the propranolol 60 mg long-acting once daily was not as effective and was switched to propranolol 20 mg tid, clonazepam 0.5 mg tablet one in the morning, 0.25 mg at noon and 0.25 mg in the evening. Activity makes the tremor worse. She does note an improvement with alcohol and takes only one propranolol when she has had a drink. She drinks occasionally.   She has a history of tremors for many years, treated with clonazepam for a number of years, but tremor exacerbated in 1997 after her pulmonary embolus. She had withdrawal symptoms coming off of clonazepam. She had been on Mysoline but did not tolerate it. She has seen several other doctors for the tremor. TSH was negative in the past. She was tried on methazolamide without benefit. X  ray lumbar spine from 03/24/13 showed T9 compression fracture, age indeterminate.  Her Past Medical History Is Significant For: Past Medical History  Diagnosis Date  . Personal history of venous thrombosis and embolism 1997  . Essential and other specified forms of tremor   . Cancer [facility])     Endometrial cancer  . Osteoporosis 11/2015    T score -2.8 stable from prior DEXA  . C. difficile colitis   . Thrush     Her Past Surgical History Is Significant For: Past Surgical History  Procedure Laterality Date   . Cesarean section  1961  . Abdominal hysterectomy  1997  . Oophorectomy      BSO  . Tonsillectomy and adenoidectomy    . Appendectomy      Her Family History Is Significant For: Family History  Problem Relation Age of Onset  . Cancer Mother     Unknown type  . Cancer Father     Bone  . Cancer Brother     Prostate  . Heart disease Brother   . Cancer Brother     Prostate  . Heart disease Brother   . Heart disease Sister   . Heart disease Sister   . Kidney failure Sister   . Diabetes Sister     Her Social History Is Significant For: Social History   Social History  . Marital Status: Widowed    Spouse Name: N/A  . Number of Children: N/A  . Years of Education: N/A   Social History Main Topics  . Smoking status: Never Smoker   . Smokeless tobacco: None  . Alcohol Use: 0.0 oz/week    0 Standard drinks or equivalent per week     Comment: ocassionally  . Drug Use: No  . Sexual Activity: No     Comment: HYST-1st intercourse 82 yo-Fewer than 5 partners   Other Topics Concern  . None   Social History Narrative    Her Allergies Are:  Allergies  Allergen Reactions  . Codeine Hives  . Mysoline [Primidone]   . Pneumovax 23 [Pneumococcal Vac Polyvalent]     Red, raised area.  Knot at site of injection  :   Her Current Medications Are:  Outpatient Encounter Prescriptions as of 01/08/2016  Medication Sig  . aspirin 81 MG tablet Take 81 mg by mouth daily.  . Calcium-Magnesium-Vitamin D [phone] MG-MG-UNIT TABS Take by mouth daily.  . Cholecalciferol (VITAMIN D PO) Take 2,000 Units by mouth 2 (two) times daily.   . clonazePAM (KLONOPIN) 0.5 MG tablet Take 1 pill in AM, 1/2 pill at noon and 1/2 pill at night.  . Lactobacillus (PROBIOTIC ACIDOPHILUS) TABS Take by mouth daily.  . propranolol (INDERAL) 20 MG tablet Take 1 tablet (20 mg total) by mouth 3 (three) times daily.   No facility-administered encounter medications on file as of 01/08/2016.  :  Review of  Systems:  Out of a complete 14 point review of systems, all are reviewed and negative with the exception of these symptoms as listed below:  Review of Systems  Neurological:       Patient would like to discuss her tremors. She has questions like will her tremors progress with age? And can it turn into other conditions?     Objective:  Neurologic Exam  Physical Exam Physical Examination:   Filed Vitals:   01/08/16 1441  BP: 112/68  Pulse: 76  Resp: 14   General Examination: The patient is a very pleasant 82 y.o. female  in no acute distress. She appears well-developed and well-nourished and very well groomed.   HEENT: Normocephalic, atraumatic, pupils are equal, round and reactive to light and accommodation. Extraocular tracking is good without limitation to gaze excursion or nystagmus noted. Normal smooth pursuit is noted. Hearing is grossly intact. Face is symmetric with normal facial animation and normal facial sensation. Speech is clear with no dysarthria noted. There is no hypophonia. There is a mild lip, neck/head, and jaw tremor and a minimal voice tremor, appeared to be stable. Neck is supple with full range of passive and active motion. There are no carotid bruits on auscultation. Oropharynx exam reveals: mild mouth dryness, adequate dental hygiene and mild airway crowding. Mallampati is class II. Tongue protrudes centrally and palate elevates symmetrically. No abnormal tongue plaque or coating is noted.    Chest: Clear to auscultation without wheezing, rhonchi or crackles noted.  Heart: S1+S2+0, regular and normal without murmurs, rubs or gallops noted.   Abdomen: Soft, non-tender and non-distended with normal bowel sounds appreciated on auscultation.  Extremities: There is a touch of puffiness around the ankles bilaterally. Pedal pulses are intact.  Skin: Warm and dry without trophic changes noted. There are no varicose veins.   Musculoskeletal: exam reveals no obvious  joint deformities, tenderness or joint swelling or erythema.   Neurologically:  Mental status: The patient is awake, alert and oriented in all 4 spheres. Her memory, attention, language and knowledge are appropriate. There is no aphasia, agnosia, apraxia or anomia. Speech is clear with normal prosody and enunciation. Thought process is linear. Mood is congruent and affect is normal.  Cranial nerves are as described above under HEENT exam. In addition, shoulder shrug is normal with equal shoulder height noted. Motor exam: Normal bulk, strength and tone is noted. There is no drift, or rebound. She has a mild postural and action tremor in her hands. There is a slight resting component, which is intermittent. Romberg is negative. Reflexes are 2+ throughout. Fine motor skills are intact with normal finger taps, normal hand movements, normal rapid alternating patting, normal foot taps and normal foot agility.  Cerebellar testing shows no dysmetria or intention tremor on finger to nose testing. Heel to shin is normal bilaterally. There is no truncal or gait ataxia.  Sensory exam is intact to light touch in the upper and lower extremities.  Gait, station and balance are unremarkable. No veering to one side is noted. No leaning to one side is noted. Posture is age-appropriate and stance is narrow based. No problems turning are noted. She turns en bloc. Tandem walk is unremarkable for age.    Assessment and Plan:   In summary, Lashann Hagg is a very pleasant 82 year old female with an underlying medical history of anxiety, osteoporosis, history of DVT, hypertension, hyperlipidemia, and vitamin D deficiency, who presents for follow-up consultation of her essential tremor. She has remained fairly stable for years, with perhaps slight and gradual progression. However, we have to expect that, but I don't think we should change her medication at this time. She was advised, about possible progression with time and  that she has no signs of parkinsonism. She was reassured in that regard.  We talked about maintaining a healthy lifestyle in general and tremor triggers. She notices that caffeine flares it up and that a drink of alcohol calms it.  I encouraged the patient to eat healthy, exercise daily and keep well hydrated, to keep a scheduled bedtime and wake time routine, to not skip  any meals and eat healthy snacks in between meals and to have protein with every meal. She is encouraged to drink more water.     As far as medications are concerned, I recommended the following at this time: no change. I renewed her prescriptions for propranolol and clonazepam for 30 days with refills.  I answered all her questions today and the patient was in agreement with the above outlined plan. I would like to see the patient back in 6 months, sooner if the need arises and encouraged her to call with any interim questions, concerns, problems or updates and refill requests. I spent 25 minutes in total face-to-face time with the patient, more than 50% of which was spent in counseling and coordination of care, reviewing test results, reviewing medication and discussing or reviewing the diagnosis of ET, its prognosis and treatment options.

## 2016-01-27 ENCOUNTER — Encounter: Payer: Self-pay | Admitting: *Deleted

## 2016-01-31 ENCOUNTER — Ambulatory Visit: Payer: Self-pay | Admitting: Internal Medicine

## 2016-03-30 ENCOUNTER — Encounter: Payer: Self-pay | Admitting: *Deleted

## 2016-04-10 ENCOUNTER — Encounter: Payer: Self-pay | Admitting: Physician Assistant

## 2016-04-10 ENCOUNTER — Ambulatory Visit (INDEPENDENT_AMBULATORY_CARE_PROVIDER_SITE_OTHER): Payer: Medicare Other | Admitting: Physician Assistant

## 2016-04-10 VITALS — BP 110/64 | HR 60 | Temp 97.5°F | Resp 16 | Ht 64.25 in | Wt 144.6 lb

## 2016-04-10 DIAGNOSIS — C541 Malignant neoplasm of endometrium: Secondary | ICD-10-CM | POA: Diagnosis not present

## 2016-04-10 DIAGNOSIS — Z79899 Other long term (current) drug therapy: Secondary | ICD-10-CM | POA: Diagnosis not present

## 2016-04-10 DIAGNOSIS — M25511 Pain in right shoulder: Secondary | ICD-10-CM

## 2016-04-10 DIAGNOSIS — N368 Other specified disorders of urethra: Secondary | ICD-10-CM

## 2016-04-10 DIAGNOSIS — Z86718 Personal history of other venous thrombosis and embolism: Secondary | ICD-10-CM

## 2016-04-10 DIAGNOSIS — E782 Mixed hyperlipidemia: Secondary | ICD-10-CM

## 2016-04-10 DIAGNOSIS — N183 Chronic kidney disease, stage 3 unspecified: Secondary | ICD-10-CM

## 2016-04-10 DIAGNOSIS — G250 Essential tremor: Secondary | ICD-10-CM

## 2016-04-10 DIAGNOSIS — I10 Essential (primary) hypertension: Secondary | ICD-10-CM | POA: Diagnosis not present

## 2016-04-10 DIAGNOSIS — E559 Vitamin D deficiency, unspecified: Secondary | ICD-10-CM | POA: Diagnosis not present

## 2016-04-10 DIAGNOSIS — M810 Age-related osteoporosis without current pathological fracture: Secondary | ICD-10-CM | POA: Diagnosis not present

## 2016-04-10 DIAGNOSIS — Z0001 Encounter for general adult medical examination with abnormal findings: Secondary | ICD-10-CM

## 2016-04-10 DIAGNOSIS — Z0000 Encounter for general adult medical examination without abnormal findings: Secondary | ICD-10-CM

## 2016-04-10 DIAGNOSIS — R7303 Prediabetes: Secondary | ICD-10-CM

## 2016-04-10 DIAGNOSIS — R6889 Other general symptoms and signs: Secondary | ICD-10-CM

## 2016-04-10 MED ORDER — MELOXICAM 15 MG PO TABS: ORAL_TABLET | ORAL | Status: DC

## 2016-04-10 MED ORDER — KETOROLAC TROMETHAMINE 30 MG/ML IJ SOLN
60.0000 mg | Freq: Once | INTRAMUSCULAR | Status: AC
  Administered 2016-04-10: 60 mg via INTRAMUSCULAR

## 2016-04-10 NOTE — Patient Instructions (Signed)

## 2016-04-10 NOTE — Progress Notes (Signed)
Patient ID: Monica Curry, female   DOB: May 29, 1931, 82 y.o.   MRN: [id number]  MEDICARE ANNUAL WELLNESS VISIT AND ACUTE OV  Assessment:   1. Essential hypertension   2. Hyperlipidemia   3. Prediabetes   4. Vitamin D deficiency   5. Essential tremor   6. Osteoporosis   7. Medication management   8. Medicare annual wellness visit, subsequent Prevnar DUE, wants at CPE  9. Hx/o DVT/PE   10. Urethral prolapse   11. Endometrial cancer ([place])   12.  HT CKD 3 (GFR 73ml/min)   13. Right shoulder pain - meloxicam (MOBIC) 15 MG tablet; Take one daily with food for 2 weeks, can take with tylenol, can not take with aleve, iburpofen, then as needed daily for pain  Dispense: 30 tablet; Refill: 1 - ketorolac (TORADOL) 30 MG/ML injection 60 mg; Inject 2 mLs (60 mg total) into the muscle once. Declines prednisone shot or taper due to tremor, want to try this with home exercises before ortho referral, has CPE in 1 week, will reevaluate at that time    Future Appointments Date Time Provider [place]  04/15/2016 10:00 AM [facility] None  04/18/2016 10:00 AM Courtney Forcucci, PA-C [facility] None  07/08/2016 1:00 PM [name], MD [facility] None     Plan:   During the course of the visit the patient was educated and counseled about appropriate screening and preventive services including:    Pneumococcal vaccine   Influenza vaccine  Td vaccine  Screening electrocardiogram  Bone densitometry screening  Colorectal cancer screening  Diabetes screening  Glaucoma screening  Nutrition counseling   Advanced directives: requested  Conditions/risks identified: BMI: Discussed weight loss, diet, and increase physical activity.  Increase physical activity: AHA recommends 150 minutes of physical activity a week.  Medications reviewed PreDiabetes is at goal, ACE/ARB therapy: Not Indicated  Urinary Incontinence is not an issue: discussed non  pharmacology and pharmacology options.  Fall risk: low- discussed PT, home fall assessment, medications.   Subjective:   [name] presents for [employer] Visit and OV.  Date of last medicare wellness visit  07/2015.  This very nice 82 y.o.  MWF represents right should pain.   Patient has mild labile HTN. BP has been controlled and today's BP: 110/64 mmHg. Patient has had no complaints of any cardiac type chest pain, palpitations, dyspnea/orthopnea/PND, dizziness, claudication, or dependent edema.  Hyperlipidemia is controlled with diet.  Last Lipids were not at goal -  Lab Results  Component Value Date   CHOL 177 07/21/2015   HDL 43* 07/21/2015   LDLCALC 103 07/21/2015   TRIG 153* 07/21/2015   CHOLHDL 4.1 07/21/2015   Also, the patient has history of PreDiabetes and has had no symptoms of reactive hypoglycemia, diabetic polys, paresthesias or visual blurring.   Lab Results  Component Value Date   HGBA1C 5.7* 07/21/2015   Further, the patient also has history of Vitamin D Deficiency and supplements vitamin D without any suspected side-effects.  Lab Results  Component Value Date   VD25OH 44 07/21/2015   Right handed, right shoulder pain x Jan, no injury, worse with sleeping on it, movement, reaching up/putting on jacket. Aleve has helped, has been doing yard work and made it worse x 2 weeks, was dragging out trash and had acute sharp pain anterior biceps. Has been resting and a little bit better.   Names of Other Physician/Practitioners you currently use: 1. [facility] here for primary  care 2. Dr Rutherford Guys , eye doctor, last visit 2016 3. Dr Ennis Forts, [employer], dentist, last visit Feb 2016 & every 4 months  Patient Care Team: Unk Pinto, MD as PCP - General (Internal Medicine) Ronald Lobo, MD as Consulting Physician (Gastroenterology) Rutherford Guys, MD as Consulting Physician (Ophthalmology) Kathee Delton, MD as  Consulting Physician (Pulmonary Disease) Anastasio Auerbach, MD as Consulting Physician (Gynecology) Lennon Alstrom, MD as Consulting Physician (Neurology) [name], MD as Attending Physician (Neurology) Harriett Sine, MD as Consulting Physician (Dermatology)  Medication Review:   Medication List       This list is accurate as of: 04/10/16  5:13 PM.  Always use your most recent med list.               aspirin 81 MG tablet  Take 81 mg by mouth daily.     Calcium-Magnesium-Vitamin D [id number] MG-MG-UNIT Tabs  Take by mouth daily.     clonazePAM 0.5 MG tablet  Commonly known as:  KLONOPIN  Take 1 pill in AM, 1/2 pill at noon and 1/2 pill at night.     meloxicam 15 MG tablet  Commonly known as:  MOBIC  Take one daily with food for 2 weeks, can take with tylenol, can not take with aleve, iburpofen, then as needed daily for pain     PROBIOTIC ACIDOPHILUS Tabs  Take by mouth daily.     propranolol 20 MG tablet  Commonly known as:  INDERAL  Take 1 tablet (20 mg total) by mouth 3 (three) times daily.     VITAMIN D PO  Take 2,000 Units by mouth 2 (two) times daily.        Allergies  Allergen Reactions  . Codeine Hives  . Mysoline [Primidone]   . Pneumovax 23 [Pneumococcal Vac Polyvalent]     Red, raised area.  Knot at site of injection   Current Problems (verified) Patient Active Problem List   Diagnosis Date Noted  .  HT CKD 3 (GFR 31ml/min) 07/21/2015  . Medicare annual wellness visit, subsequent 07/21/2015  . Medication management 07/04/2014  . Essential hypertension 11/25/2013  . Hyperlipidemia 11/25/2013  . Prediabetes 11/25/2013  . Vitamin D deficiency 11/25/2013  . Essential tremor 11/25/2013  . Endometrial cancer ([place])   . Hx/o DVT/PE   . Urethral prolapse   . Osteoporosis 10/10/2011    Screening Tests Immunization History  Administered Date(s) Administered  . H1N1 01/03/2009  . Influenza Whole 09/23/2013  . Influenza, High Dose Seasonal PF  09/06/2014, 08/23/2015  . PPD Test 03/29/2014  . Pneumococcal Polysaccharide-23 03/29/2014  . Pneumococcal-Unspecified 12/09/2002  . Td 03/23/2013  . Zoster 12/09/2006   Preventative care: Last colonoscopy: 2011 MGM 10/17/2014 DEXA 2014 PAP 10/2014  Influenza 2016 TD 2014 Pneumonia 2015 prevnar 13 DUE, wanted to wait until CPE Zoster 2008   Past Medical History  Diagnosis Date  . Personal history of venous thrombosis and embolism 1997  . Essential and other specified forms of tremor   . Cancer [facility])     Endometrial cancer  . Osteoporosis 11/2015    T score -2.8 stable from prior DEXA  . C. difficile colitis   . Thrush    Past Surgical History  Procedure Laterality Date  . Cesarean section  1961  . Abdominal hysterectomy  1997  . Oophorectomy      BSO  . Tonsillectomy and adenoidectomy    . Appendectomy      Risk Factors: Tobacco Social History  Substance Use Topics  . Smoking status: Never Smoker   . Smokeless tobacco: None  . Alcohol Use: 0.0 oz/week    0 Standard drinks or equivalent per week     Comment: ocassionally   She does not smoke.  Patient is not a former smoker. Are there smokers in your home (other than you)?  No Alcohol Current alcohol use: occasional  Caffeine Current caffeine use: denies use  Exercise Current exercise: gardening, housecleaning, walking and yard work  Nutrition/Diet Current diet: in general, a "healthy" diet    Cardiac risk factors: none.  Depression Screen (Note: if answer to either of the following is "Yes", a more complete depression screening is indicated)   Q1: Over the past two weeks, have you felt down, depressed or hopeless? No  Q2: Over the past two weeks, have you felt little interest or pleasure in doing things? No  Have you lost interest or pleasure in daily life? No  Do you often feel hopeless? No  Do you cry easily over simple problems? No  Activities of Daily Living In your present state of  health, do you have any difficulty performing the following activities?:  Driving? No Managing money?  No Feeding yourself? No Getting from bed to chair? No Climbing a flight of stairs? No Preparing food and eating?: No Bathing or showering? No Getting dressed: No Getting to the toilet? No Using the toilet:No Moving around from place to place: No In the past year have you fallen or had a near fall?:No  Vision Difficulties: No  Hearing Difficulties: No Do you often ask people to speak up or repeat themselves? No Do you experience ringing or noises in your ears? No Do you have difficulty understanding soft or whispered voices? Sometimes.  Cognition  Do you feel that you have a problem with memory?No  Do you often misplace items? No  Do you feel safe at home?  Yes  Advanced directives Does patient have a [place]? Yes Does patient have a Living Will? Yes   Objective:     Blood pressure 110/64, pulse 60, temperature 97.5 F (36.4 C), temperature source Temporal, resp. rate 16, height 5' 4.25" (1.632 m), weight 144 lb 9.6 oz (65.59 kg), SpO2 99 %.   General Appearance: Well nourished, alert, WD/WN, female and in no apparent distress. Eyes: PERRLA, EOMs, conjunctiva no swelling or erythema, normal fundi and vessels. Sinuses: No frontal/maxillary tenderness ENT/Mouth: EACs patent / TMs  nl. Nares clear without erythema, swelling, mucoid exudates. Oral hygiene is good. No erythema, swelling, or exudate. Tongue normal, non-obstructing. Tonsils not swollen or erythematous. Hearing normal.  Neck: Supple, thyroid normal. No bruits, nodes or JVD. Respiratory: Respiratory effort normal.  BS equal and clear bilateral without rales, rhonci, wheezing or stridor. Cardio: Heart sounds are normal with regular rate and rhythm and no murmurs, rubs or gallops. Peripheral pulses are normal and equal bilaterally without edema. No aortic or femoral bruits. Chest: symmetric  with normal excursions and percussion. Abdomen: Flat, soft  with nl bowel sounds. Nontender, no guarding, rebound, hernias, masses, or organomegaly.  Lymphatics: Non tender without lymphadenopathy.  Musculoskeletal: Full ROM all peripheral extremities, joint stability, 5/5 strength, and normal gait. Skin: Warm and dry without rashes, lesions, cyanosis, clubbing or  ecchymosis.  Neuro: Cranial nerves intact, reflexes equal bilaterally. Normal muscle tone, no cerebellar symptoms. Sensation intact.  Pysch: Alert and oriented X 3, normal affect, Insight and Judgment appropriate.   Cognitive Testing  Alert? Yes  Normal Appearance?Yes  Oriented to person? Yes  Place? Yes   Time? Yes  Recall of three objects?  Yes  Can perform simple calculations? Yes  Displays appropriate judgment? Yes  Can read the correct time from a watch/clock?Yes  Medicare Attestation I have personally reviewed: The patient's medical and social history Their use of alcohol, tobacco or illicit drugs Their current medications and supplements The patient's functional ability including ADLs,fall risks, home safety risks, cognitive, and hearing and visual impairment Diet and physical activities Evidence for depression or mood disorders  The patient's weight, height, BMI, and visual acuity have been recorded in the chart.  I have made referrals, counseling, and provided education to the patient based on review of the above and I have provided the patient with a written personalized care plan for preventive services.  Over 40 minutes of exam, counseling, chart review was performed.   Vicie Mutters, PA-C   04/10/2016

## 2016-04-15 ENCOUNTER — Other Ambulatory Visit: Payer: Medicare Other

## 2016-04-15 DIAGNOSIS — E785 Hyperlipidemia, unspecified: Secondary | ICD-10-CM

## 2016-04-15 DIAGNOSIS — R7309 Other abnormal glucose: Secondary | ICD-10-CM

## 2016-04-15 DIAGNOSIS — E559 Vitamin D deficiency, unspecified: Secondary | ICD-10-CM

## 2016-04-15 DIAGNOSIS — Z0000 Encounter for general adult medical examination without abnormal findings: Secondary | ICD-10-CM

## 2016-04-15 DIAGNOSIS — I10 Essential (primary) hypertension: Secondary | ICD-10-CM

## 2016-04-15 LAB — CBC WITH DIFFERENTIAL/PLATELET
BASOS PCT: 1 %
Basophils Absolute: 41 cells/uL (ref 0–200)
EOS ABS: 164 {cells}/uL (ref 15–500)
Eosinophils Relative: 4 %
HEMATOCRIT: 41.4 % (ref 35.0–45.0)
HEMOGLOBIN: 13.5 g/dL (ref 11.7–15.5)
LYMPHS ABS: 1927 {cells}/uL (ref 850–3900)
LYMPHS PCT: 47 %
MCH: 28.7 pg (ref 27.0–33.0)
MCHC: 32.6 g/dL (ref 32.0–36.0)
MCV: 88.1 fL (ref 80.0–100.0)
MONO ABS: 369 {cells}/uL (ref 200–950)
MPV: 10.2 fL (ref 7.5–12.5)
Monocytes Relative: 9 %
Neutro Abs: 1599 cells/uL (ref 1500–7800)
Neutrophils Relative %: 39 %
Platelets: 226 10*3/uL (ref 140–400)
RBC: 4.7 MIL/uL (ref 3.80–5.10)
RDW: 14.7 % (ref 11.0–15.0)
WBC: 4.1 10*3/uL (ref 3.8–10.8)

## 2016-04-15 LAB — MAGNESIUM: Magnesium: 1.9 mg/dL (ref 1.5–2.5)

## 2016-04-15 LAB — HEPATIC FUNCTION PANEL
ALK PHOS: 32 U/L — AB (ref 33–130)
ALT: 14 U/L (ref 6–29)
AST: 21 U/L (ref 10–35)
Albumin: 3.9 g/dL (ref 3.6–5.1)
BILIRUBIN DIRECT: 0.1 mg/dL (ref <=0.2)
BILIRUBIN INDIRECT: 0.5 mg/dL (ref 0.2–1.2)
Total Bilirubin: 0.6 mg/dL (ref 0.2–1.2)
Total Protein: 6.6 g/dL (ref 6.1–8.1)

## 2016-04-15 LAB — BASIC METABOLIC PANEL WITH GFR
BUN: 23 mg/dL (ref 7–25)
CHLORIDE: 103 mmol/L (ref 98–110)
CO2: 28 mmol/L (ref 20–31)
Calcium: 9 mg/dL (ref 8.6–10.4)
Creat: 1.03 mg/dL — ABNORMAL HIGH (ref 0.60–0.88)
GFR, Est African American: 58 mL/min — ABNORMAL LOW (ref >=60)
GFR, Est Non African American: 51 mL/min — ABNORMAL LOW (ref >=60)
GLUCOSE: 89 mg/dL (ref 65–99)
POTASSIUM: 4.4 mmol/L (ref 3.5–5.3)
Sodium: 138 mmol/L (ref 135–146)

## 2016-04-15 LAB — HEMOGLOBIN A1C
Hgb A1c MFr Bld: 5.4 % (ref <5.7)
Mean Plasma Glucose: 108 mg/dL

## 2016-04-15 LAB — TSH: TSH: 2.29 mIU/L

## 2016-04-15 LAB — LIPID PANEL
CHOL/HDL RATIO: 4.1 ratio (ref <=5.0)
CHOLESTEROL: 164 mg/dL (ref 125–200)
HDL: 40 mg/dL — ABNORMAL LOW (ref >=46)
LDL CALC: 97 mg/dL (ref <130)
TRIGLYCERIDES: 134 mg/dL (ref <150)
VLDL: 27 mg/dL (ref <30)

## 2016-04-16 LAB — URINALYSIS, ROUTINE W REFLEX MICROSCOPIC
BILIRUBIN URINE: NEGATIVE
GLUCOSE, UA: NEGATIVE
Hgb urine dipstick: NEGATIVE
Ketones, ur: NEGATIVE
LEUKOCYTES UA: NEGATIVE
Nitrite: NEGATIVE
PH: 6 (ref 5.0–8.0)
Protein, ur: NEGATIVE
Specific Gravity, Urine: 1.014 (ref 1.001–1.035)

## 2016-04-16 LAB — INSULIN, RANDOM: Insulin: 4.2 u[IU]/mL (ref 2.0–19.6)

## 2016-04-16 LAB — VITAMIN D 25 HYDROXY (VIT D DEFICIENCY, FRACTURES): VIT D 25 HYDROXY: 57 ng/mL (ref 30–100)

## 2016-04-16 LAB — MICROALBUMIN / CREATININE URINE RATIO
Creatinine, Urine: 81 mg/dL (ref 20–320)
MICROALB/CREAT RATIO: 4 ug/mg{creat} (ref <30)
Microalb, Ur: 0.3 mg/dL

## 2016-04-18 ENCOUNTER — Encounter: Payer: Self-pay | Admitting: Internal Medicine

## 2016-04-18 ENCOUNTER — Ambulatory Visit (INDEPENDENT_AMBULATORY_CARE_PROVIDER_SITE_OTHER): Payer: Medicare Other | Admitting: Internal Medicine

## 2016-04-18 VITALS — BP 118/62 | HR 62 | Temp 98.0°F | Resp 16 | Ht 64.0 in | Wt 144.0 lb

## 2016-04-18 DIAGNOSIS — N368 Other specified disorders of urethra: Secondary | ICD-10-CM

## 2016-04-18 DIAGNOSIS — E559 Vitamin D deficiency, unspecified: Secondary | ICD-10-CM

## 2016-04-18 DIAGNOSIS — Z0000 Encounter for general adult medical examination without abnormal findings: Secondary | ICD-10-CM | POA: Diagnosis not present

## 2016-04-18 DIAGNOSIS — Z86718 Personal history of other venous thrombosis and embolism: Secondary | ICD-10-CM

## 2016-04-18 DIAGNOSIS — I10 Essential (primary) hypertension: Secondary | ICD-10-CM | POA: Diagnosis not present

## 2016-04-18 DIAGNOSIS — N183 Chronic kidney disease, stage 3 unspecified: Secondary | ICD-10-CM

## 2016-04-18 DIAGNOSIS — G250 Essential tremor: Secondary | ICD-10-CM

## 2016-04-18 DIAGNOSIS — Z136 Encounter for screening for cardiovascular disorders: Secondary | ICD-10-CM

## 2016-04-18 DIAGNOSIS — M7581 Other shoulder lesions, right shoulder: Secondary | ICD-10-CM

## 2016-04-18 DIAGNOSIS — Z79899 Other long term (current) drug therapy: Secondary | ICD-10-CM

## 2016-04-18 DIAGNOSIS — M810 Age-related osteoporosis without current pathological fracture: Secondary | ICD-10-CM

## 2016-04-18 DIAGNOSIS — C541 Malignant neoplasm of endometrium: Secondary | ICD-10-CM

## 2016-04-18 NOTE — Progress Notes (Signed)
Complete Physical  Assessment and Plan:   1. Essential hypertension -controlled with diet and exercise -well controlled today -DASH diet -monitor  2. Essential tremor -clonazepam prn  3. Osteoporosis -continued vitamin D and calcium supplement -impact exercises  4. Endometrial cancer ([place]) -hysterctomy -followed by Dr. Phineas Real  5. Urethral prolapse -followed by Dr. Phineas Real  6.  HT CKD 3 (GFR 80ml/min) -avoid NSAIDs for long periods of time -plenty of hydration  7. Hx/o DVT/PE -provoked -no longer on AC therapy -cont asa  8. Vitamin D deficiency -cont supplement -in ideal range  9. Medication management -cont at regular visits  10. Right rotator cuff tendinitis -aleve 500 mg twice daily with foods -tylenol if needed to supplement -avoid any rehab exercises until seen by ortho - Ambulatory referral to Orthopedics    Discussed med's effects and SE's. Screening labs and tests as requested with regular follow-up as recommended.  HPI  82 y.o. female  presents for a complete physical.  Her blood pressure has been controlled at home, today [name] is BP: 118/62 mmHg.  She does workout. She denies chest pain, shortness of breath, dizziness.   She is on cholesterol medication and denies myalgias. Her cholesterol is at goal. The cholesterol last visit was:  Lab Results  Component Value Date   CHOL 164 04/15/2016   HDL 40* 04/15/2016   LDLCALC 97 04/15/2016   TRIG 134 04/15/2016   CHOLHDL 4.1 04/15/2016  .  She has been working on diet and exercise for prediabetes, she is on bASA, she is not on ACE/ARB and denies foot ulcerations, hyperglycemia, hypoglycemia , increased appetite, nausea, paresthesia of the feet, polydipsia, polyuria, visual disturbances, vomiting and weight loss. Last A1C in the office was:  Lab Results  Component Value Date   HGBA1C 5.4 04/15/2016    Patient is on Vitamin D supplement.   Lab Results  Component Value Date   VD25OH 22  04/15/2016     She reports that she has been having issues with her right shoulder.  She is right hand dominant.  She cannot lay on that side.  She has no injury that she can think of.  She notes that she had been doing some yard work and developed a sharp pain in the right lateral humerous and the shoulder joint.  She did get an injection of toradol last week and was given meloxicam.    Current Medications:  Current Outpatient Prescriptions on File Prior to Visit  Medication Sig Dispense Refill  . aspirin 81 MG tablet Take 81 mg by mouth daily.    . Calcium-Magnesium-Vitamin D [id number] MG-MG-UNIT TABS Take by mouth daily.    . Cholecalciferol (VITAMIN D PO) Take 2,000 Units by mouth 2 (two) times daily.     . clonazePAM (KLONOPIN) 0.5 MG tablet Take 1 pill in AM, 1/2 pill at noon and 1/2 pill at night. 60 tablet 5  . Lactobacillus (PROBIOTIC ACIDOPHILUS) TABS Take by mouth daily.    . propranolol (INDERAL) 20 MG tablet Take 1 tablet (20 mg total) by mouth 3 (three) times daily. 90 tablet 5   No current facility-administered medications on file prior to visit.    Health Maintenance:   Immunization History  Administered Date(s) Administered  . H1N1 01/03/2009  . Influenza Whole 09/23/2013  . Influenza, High Dose Seasonal PF 09/06/2014, 08/23/2015  . PPD Test 03/29/2014  . Pneumococcal Polysaccharide-23 03/29/2014  . Pneumococcal-Unspecified 12/09/2002  . Td 03/23/2013  . Zoster 12/09/2006    Tetanus:  2014 Pneumovax: 2015 Flu vaccine: 2016 Zostavax: 2008 MGM: 2016 DEXA: 2016 Last Eye Exam: Dr. Gershon Crane, 01/29/16  Patient Care Team: Unk Pinto, MD as PCP - General (Internal Medicine) Ronald Lobo, MD as Consulting Physician (Gastroenterology) Rutherford Guys, MD as Consulting Physician (Ophthalmology) Kathee Delton, MD as Consulting Physician (Pulmonary Disease) Anastasio Auerbach, MD as Consulting Physician (Gynecology) Lennon Alstrom, MD as Consulting Physician  (Neurology) [name], MD as Attending Physician (Neurology) Harriett Sine, MD as Consulting Physician (Dermatology)  Allergies:  Allergies  Allergen Reactions  . Codeine Hives  . Mysoline [Primidone]   . Pneumovax 23 [Pneumococcal Vac Polyvalent]     Red, raised area.  Knot at site of injection    Medical History:  Past Medical History  Diagnosis Date  . Personal history of venous thrombosis and embolism 1997  . Essential and other specified forms of tremor   . Cancer [facility])     Endometrial cancer  . Osteoporosis 11/2015    T score -2.8 stable from prior DEXA  . C. difficile colitis   . Thrush     Surgical History:  Past Surgical History  Procedure Laterality Date  . Cesarean section  1961  . Abdominal hysterectomy  1997  . Oophorectomy      BSO  . Tonsillectomy and adenoidectomy    . Appendectomy      Family History:  Family History  Problem Relation Age of Onset  . Cancer Mother     Unknown type  . Cancer Father     Bone  . Cancer Brother     Prostate  . Heart disease Brother   . Cancer Brother     Prostate  . Heart disease Brother   . Heart disease Sister   . Heart disease Sister   . Kidney failure Sister   . Diabetes Sister     Social History:  Social History  Substance Use Topics  . Smoking status: Never Smoker   . Smokeless tobacco: None  . Alcohol Use: 0.0 oz/week    0 Standard drinks or equivalent per week     Comment: ocassionally    Review of Systems: ROS  Physical Exam: Estimated body mass index is 24.71 kg/(m^2) as calculated from the following:   Height as of this encounter: 5\' 4"  (1.626 m).   Weight as of this encounter: 144 lb (65.318 kg). BP 118/62 mmHg  Pulse 62  Temp(Src) 98 F (36.7 C) (Temporal)  Resp 16  Ht 5\' 4"  (1.626 m)  Wt 144 lb (65.318 kg)  BMI 24.71 kg/m2  General Appearance: Well nourished well developed, in no apparent distress.  Eyes: PERRLA, EOMs, conjunctiva no swelling or erythema ENT/Mouth: Ear  canals normal without obstruction, swelling, erythema, or discharge.  TMs normal bilaterally with no erythema, bulging, retraction, or loss of landmark.  Oropharynx moist and clear with no exudate, erythema, or swelling.   Neck: Supple, thyroid normal. No bruits.  No cervical adenopathy Respiratory: Respiratory effort normal, Breath sounds clear A&P without wheeze, rhonchi, rales.   Cardio: RRR without murmurs, rubs or gallops. Brisk peripheral pulses without edema.  Chest: symmetric, with normal excursions  Abdomen: Soft, nontender, no guarding, rebound, hernias, masses, or organomegaly.  Lymphatics: Non tender without lymphadenopathy.  Musculoskeletal: Full ROM all peripheral extremities,5/5 strength, and normal gait. Right shoulder with limited range of motion above 100 degrees. Limited external rotation due to pain  No tenderness to palpation of the right shoulder.  Positive speeds test, positive empty can  test, 4/5 strength in all directions.   Skin: Warm, dry without rashes, lesions, ecchymosis. Neuro: Awake and oriented X 3, Cranial nerves intact, reflexes equal bilaterally. Normal muscle tone, no cerebellar symptoms. Sensation intact.  Psych:  normal affect, Insight and Judgment appropriate.   EKG: WNL no changes.  Over 40 minutes of exam, counseling, chart review and critical decision making was performed  Loma Sousa Forcucci 10:24 AM [facility]

## 2016-04-18 NOTE — Patient Instructions (Signed)
Please take two tablets of Aleve with breakfast and dinner.  If you are having a lot of pain you can take 1,000 mg of tylenol up to 3 times per day.    You will get a call from [place] at the beginning of next week.  If you do not hear from her by next Wednesday please call the office.

## 2016-04-27 NOTE — Addendum Note (Signed)
Addended by: Loralee Weitzman A on: 04/27/2016 12:00 PM   Modules accepted: Orders

## 2016-07-08 ENCOUNTER — Ambulatory Visit (INDEPENDENT_AMBULATORY_CARE_PROVIDER_SITE_OTHER): Payer: Medicare Other | Admitting: Neurology

## 2016-07-08 ENCOUNTER — Encounter: Payer: Self-pay | Admitting: Neurology

## 2016-07-08 DIAGNOSIS — G250 Essential tremor: Secondary | ICD-10-CM | POA: Diagnosis not present

## 2016-07-08 MED ORDER — CLONAZEPAM 0.5 MG PO TABS: ORAL_TABLET | ORAL | 5 refills | Status: DC

## 2016-07-08 MED ORDER — PROPRANOLOL HCL 20 MG PO TABS: 20.0000 mg | ORAL_TABLET | Freq: Three times a day (TID) | ORAL | 5 refills | Status: DC

## 2016-07-08 NOTE — Progress Notes (Signed)
Subjective:    Patient ID: Monica Curry is a 82 y.o. female.  HPI     Interim history:   Monica Curry is a very pleasant 82 year old right-handed woman with an underlying medical history of uterine cancer, anxiety and PE. She is status post hysterectomy and tonsillectomy, who presents for followup consultation of her essential tremor. She is unaccompanied today. I last saw her on 01/08/2016, at which time she felt she was doing quite well, stable with her tremor overall but flareup notable when she was stressed out or when anxious. I suggested we continue with her current medication regimen for her tremors.   Today, 07/08/2016: She reports feeling stable with her tremor.    Previously:     I saw her on 07/03/2015, at which time she reported having had C. 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 Tremor wise she felt stable and had no side effects on her medications. I suggested she continue with the same doses of propranolol and low-dose clonazepam.   I saw her on 01/02/2015, at which time she reported doing well. She had tried the propranolol long-acting at one time in the past, but it did not work as well. She was prescribed trazodone 150 mg by her PCP. She tried 1/2 pill for about 2 weeks, but it did not help and made her drowsy during the day. She tried melatonin with no significant success, and she bought a white noise machine and stopped the melatonin. She had a reaction to Pneumovax in 2015 and did take the flu shot. I suggested she continue low-dose clonazepam and kept her on Inderal generic 20 mg 3 times a day.   I saw her on 06/30/14, at which time she reported doing well fro the tremor standpoint. She was having trouble going to sleep and staying asleep. She was worried about her brother who had heart surgery and was in the  hospital. I kept her on propranolol and clonazepam.   I saw her on 12/31/2013, at which time I felt she was stable. I did not change her medications. I suggested a routine six-month checkup. She reported volunteering at the [facility]. She felt stable. She reported having a cousin with Parkinson's disease and one aunt with voice tremor. She had a bone density scan on 10/14/13 and it showed stable findings.    I first met her on 07/02/2013, at which time I felt her exam was stable and it did not change her medications. I had reviewed recent blood work and did not have any new test at the time. I renewed her prescriptions for propranolol and clonazepam at the time.   She previously followed with Dr. Morene Antu and last saw him on 12/31/2012, and which time he started her on propranolol.   She felt the propranolol 60 mg long-acting once daily was not as effective and was switched to propranolol 20 mg tid, clonazepam 0.5 mg tablet one in the morning, 0.25 mg at noon and 0.25 mg in the evening. Activity makes the tremor worse. She does note an improvement with alcohol and takes only one propranolol when she has had a drink. She drinks occasionally.   She has a history of tremors for many years, treated with clonazepam for a number of years, but tremor exacerbated in 1997 after her pulmonary embolus. She had withdrawal symptoms coming off of  clonazepam. She had been on Mysoline but did not tolerate it. She has seen several other doctors for the tremor. TSH was negative in the past. She was tried on methazolamide without benefit. X ray lumbar spine from 03/24/13 showed T9 compression fracture, age indeterminate.  Her Past Medical History Is Significant For: Past Medical History:  Diagnosis Date  . C. difficile colitis   . Cancer [facility])    Endometrial cancer  . Essential and other specified forms of tremor   . Osteoporosis 11/2015   T score -2.8 stable from prior DEXA  . Personal history of venous  thrombosis and embolism 1997  . Thrush     Her Past Surgical History Is Significant For: Past Surgical History:  Procedure Laterality Date  . ABDOMINAL HYSTERECTOMY  1997  . APPENDECTOMY    . [place]  . OOPHORECTOMY     BSO  . TONSILLECTOMY AND ADENOIDECTOMY      Her Family History Is Significant For: Family History  Problem Relation Age of Onset  . Cancer Mother     Unknown type  . Cancer Father     Bone  . Cancer Brother     Prostate  . Heart disease Brother   . Cancer Brother     Prostate  . Heart disease Brother   . Heart disease Sister   . Heart disease Sister   . Kidney failure Sister   . Diabetes Sister     Her Social History Is Significant For: Social History   Social History  . Marital status: Widowed    Spouse name: N/A  . Number of children: N/A  . Years of education: N/A   Social History Main Topics  . Smoking status: Never Smoker  . Smokeless tobacco: None  . Alcohol use 0.0 oz/week     Comment: ocassionally  . Drug use: No  . Sexual activity: No     Comment: HYST-1st intercourse 82 yo-Fewer than 5 partners   Other Topics Concern  . None   Social History Narrative  . None    Her Allergies Are:  Allergies  Allergen Reactions  . Codeine Hives  . Meloxicam Nausea Only    Gastritis and nausea  . Mysoline [Primidone]   . Pneumovax 23 [Pneumococcal Vac Polyvalent]     Red, raised area.  Knot at site of injection  . Toradol [Ketorolac Tromethamine] Swelling    Localized redness at injection site.    :   Her Current Medications Are:  Outpatient Encounter Prescriptions as of 07/08/2016  Medication Sig  . aspirin 81 MG tablet Take 81 mg by mouth daily.  . Calcium-Magnesium-Vitamin D [phone] MG-MG-UNIT TABS Take by mouth daily.  . Cholecalciferol (VITAMIN D PO) Take 2,000 Units by mouth 2 (two) times daily.   . clonazePAM (KLONOPIN) 0.5 MG tablet Take 1 pill in AM, 1/2 pill at noon and 1/2 pill at night.  .  Lactobacillus (PROBIOTIC ACIDOPHILUS) TABS Take by mouth daily.  . propranolol (INDERAL) 20 MG tablet Take 1 tablet (20 mg total) by mouth 3 (three) times daily.  . [DISCONTINUED] acetaminophen (TYLENOL) 500 MG tablet Take 500 mg by mouth every 6 (six) hours as needed.   No facility-administered encounter medications on file as of 07/08/2016.   :  Review of Systems:  Out of a complete 14 point review of systems, all are reviewed and negative with the exception of these symptoms as listed below: Review of Systems  Musculoskeletal:  Single episode of bilateral leg numbness and rash   Neurological:       Patient reports her tremors are the same. They will increase if she is tired or stressed.     Objective:  Neurologic Exam  Physical Exam Physical Examination:   Vitals:   07/08/16 1256  BP: 118/72  Pulse: 70  Resp: 14   General Examination: The patient is a very pleasant 82 y.o. female in no acute distress. She appears well-developed and well-nourished and very well groomed.   HEENT: Normocephalic, atraumatic, pupils are equal, round and reactive to light and accommodation. Extraocular tracking is good without limitation to gaze excursion or nystagmus noted. Normal smooth pursuit is noted. Hearing is grossly intact. Face is symmetric with normal facial animation and normal facial sensation. Speech is clear with no dysarthria noted. There is no hypophonia. There is a mild lip, neck/head, and jaw tremor and a minimal voice tremor, stable. Neck is supple with full range of passive and active motion. There are no carotid bruits on auscultation. Oropharynx exam reveals: mild mouth dryness, adequate dental hygiene and mild airway crowding. Mallampati is class II. Tongue protrudes centrally and palate elevates symmetrically. No abnormal tongue plaque or coating is noted.    Chest: Clear to auscultation without wheezing, rhonchi or crackles noted.  Heart: S1+S2+0, regular and normal  without murmurs, rubs or gallops noted.   Abdomen: Soft, non-tender and non-distended with normal bowel sounds appreciated on auscultation.  Extremities: There is a touch of puffiness around the ankles bilaterally, no edema. Pedal pulses are intact.  Skin: Warm and dry without trophic changes noted. There are no varicose veins. She has a mild purpura like rash, non-raised, no urticaria.    Musculoskeletal: exam reveals no obvious joint deformities, tenderness or joint swelling or erythema.   Neurologically:  Mental status: The patient is awake, alert and oriented in all 4 spheres. Her memory, attention, language and knowledge are appropriate. There is no aphasia, agnosia, apraxia or anomia. Speech is clear with normal prosody and enunciation. Thought process is linear. Mood is congruent and affect is normal.  Cranial nerves are as described above under HEENT exam. In addition, shoulder shrug is normal with equal shoulder height noted. Motor exam: Normal bulk, strength and tone is noted. There is no drift, or rebound. She has a mild postural and action tremor in her hands. There is a very slight resting component, which is intermittent. Romberg is negative. Reflexes are 2+ throughout. Fine motor skills are intact with normal finger taps, normal hand movements, normal rapid alternating patting, normal foot taps and normal foot agility.  Cerebellar testing shows no dysmetria or intention tremor on finger to nose testing. Heel to shin is normal bilaterally. There is no truncal or gait ataxia.  Sensory exam is intact to light touch in the upper and lower extremities.  Gait, station and balance are unremarkable. No veering to one side is noted. No leaning to one side is noted. Posture is age-appropriate and stance is narrow based. No problems turning are noted. Tandem walk is good.   Assessment and Plan:   In summary, Monica Curry is a very pleasant 82 year old female with an underlying medical  history of anxiety, osteoporosis, history of DVT, hypertension, hyperlipidemia, and vitamin D deficiency, who presents for follow-up consultation of her essential tremor. She has had very little and slow progression over years. She was advised, about possible progression with time and that she has no signs of parkinsonism. She was  reassured in that regard.  We talked about maintaining a healthy lifestyle in general and tremor triggers. She notices that caffeine flares it up and that a drink of alcohol calms it.  I encouraged the patient to eat healthy, exercise daily and keep well hydrated, to keep a scheduled bedtime and wake time routine.     As far as medications are concerned, I recommended the following at this time: no change, we will keep the propranolol at 20 mg tid, and clonazepam at low dose, 1 pill in AM and 1/2 pill at lunch and evening. I renewed her prescriptions for propranolol and clonazepam for 30 days with refills.  She is advised to monitor her rash and leg swelling. She is advised to get in touch with PCP as needed.  I answered all her questions today and the patient was in agreement with the above outlined plan. I would like to see the patient back in 6 months, sooner if the need arises and encouraged her to call with any interim questions, concerns, problems or updates and refill requests. I spent 25 minutes in total face-to-face time with the patient, more than 50% of which was spent in counseling and coordination of care, reviewing test results, reviewing medication and discussing or reviewing the diagnosis of ET, its prognosis and treatment options.

## 2016-07-08 NOTE — Patient Instructions (Addendum)
  Please remember, that any kind of tremor may be exacerbated by anxiety, anger, nervousness, excitement, dehydration, sleep deprivation, by caffeine, and low blood sugar values or blood sugar fluctuations. Some medications, especially some antidepressants and lithium can cause or exacerbate tremors. Tremors may temporarily calm down her subside with the use of a benzodiazepine such as Valium or related medications and with alcohol. Be aware however that drinking alcohol is not an approved treatment or appropriate treatment for tremor control and long-term use of benzodiazepines such as Valium, lorazepam, alprazolam, or clonazepam can cause habit formation, physical and psychological addiction.  We will keep your tremor medication the same. You are on a low dose of clonazepam.   Monitor your leg swelling and your rash, may need to see your primary care physician.

## 2016-08-21 ENCOUNTER — Ambulatory Visit (INDEPENDENT_AMBULATORY_CARE_PROVIDER_SITE_OTHER): Payer: Medicare Other | Admitting: *Deleted

## 2016-08-21 DIAGNOSIS — Z23 Encounter for immunization: Secondary | ICD-10-CM | POA: Diagnosis not present

## 2016-10-23 ENCOUNTER — Other Ambulatory Visit: Payer: Self-pay | Admitting: Internal Medicine

## 2016-10-23 DIAGNOSIS — N183 Chronic kidney disease, stage 3 unspecified: Secondary | ICD-10-CM

## 2016-10-23 DIAGNOSIS — E782 Mixed hyperlipidemia: Secondary | ICD-10-CM

## 2016-10-23 DIAGNOSIS — Z79899 Other long term (current) drug therapy: Secondary | ICD-10-CM

## 2016-10-23 DIAGNOSIS — R7303 Prediabetes: Secondary | ICD-10-CM

## 2016-10-23 DIAGNOSIS — I10 Essential (primary) hypertension: Secondary | ICD-10-CM

## 2016-10-23 DIAGNOSIS — E559 Vitamin D deficiency, unspecified: Secondary | ICD-10-CM

## 2016-10-24 ENCOUNTER — Other Ambulatory Visit: Payer: Medicare Other

## 2016-10-24 DIAGNOSIS — E559 Vitamin D deficiency, unspecified: Secondary | ICD-10-CM

## 2016-10-24 DIAGNOSIS — E782 Mixed hyperlipidemia: Secondary | ICD-10-CM

## 2016-10-24 DIAGNOSIS — Z79899 Other long term (current) drug therapy: Secondary | ICD-10-CM

## 2016-10-24 DIAGNOSIS — R7303 Prediabetes: Secondary | ICD-10-CM

## 2016-10-24 DIAGNOSIS — I10 Essential (primary) hypertension: Secondary | ICD-10-CM

## 2016-10-24 LAB — BASIC METABOLIC PANEL WITH GFR
BUN: 22 mg/dL (ref 7–25)
CHLORIDE: 103 mmol/L (ref 98–110)
CO2: 27 mmol/L (ref 20–31)
Calcium: 9.9 mg/dL (ref 8.6–10.4)
Creat: 1.12 mg/dL — ABNORMAL HIGH (ref 0.60–0.88)
GFR, EST AFRICAN AMERICAN: 52 mL/min — AB (ref >=60)
GFR, EST NON AFRICAN AMERICAN: 46 mL/min — AB (ref >=60)
Glucose, Bld: 93 mg/dL (ref 65–99)
POTASSIUM: 4.8 mmol/L (ref 3.5–5.3)
SODIUM: 138 mmol/L (ref 135–146)

## 2016-10-24 LAB — CBC WITH DIFFERENTIAL/PLATELET
BASOS PCT: 1 %
Basophils Absolute: 44 cells/uL (ref 0–200)
EOS ABS: 132 {cells}/uL (ref 15–500)
EOS PCT: 3 %
HCT: 43.9 % (ref 35.0–45.0)
Hemoglobin: 14.5 g/dL (ref 11.7–15.5)
LYMPHS PCT: 52 %
Lymphs Abs: 2288 cells/uL (ref 850–3900)
MCH: 28.8 pg (ref 27.0–33.0)
MCHC: 33 g/dL (ref 32.0–36.0)
MCV: 87.1 fL (ref 80.0–100.0)
MONOS PCT: 10 %
MPV: 9.9 fL (ref 7.5–12.5)
Monocytes Absolute: 440 cells/uL (ref 200–950)
NEUTROS ABS: 1496 {cells}/uL — AB (ref 1500–7800)
Neutrophils Relative %: 34 %
PLATELETS: 262 10*3/uL (ref 140–400)
RBC: 5.04 MIL/uL (ref 3.80–5.10)
RDW: 14.6 % (ref 11.0–15.0)
WBC: 4.4 10*3/uL (ref 3.8–10.8)

## 2016-10-24 LAB — HEPATIC FUNCTION PANEL
ALBUMIN: 4.3 g/dL (ref 3.6–5.1)
ALK PHOS: 33 U/L (ref 33–130)
ALT: 9 U/L (ref 6–29)
AST: 16 U/L (ref 10–35)
BILIRUBIN DIRECT: 0.1 mg/dL (ref <=0.2)
BILIRUBIN INDIRECT: 0.6 mg/dL (ref 0.2–1.2)
BILIRUBIN TOTAL: 0.7 mg/dL (ref 0.2–1.2)
Total Protein: 6.9 g/dL (ref 6.1–8.1)

## 2016-10-24 LAB — TSH: TSH: 3.3 mIU/L

## 2016-10-25 ENCOUNTER — Encounter: Payer: Self-pay | Admitting: Internal Medicine

## 2016-10-25 ENCOUNTER — Ambulatory Visit (INDEPENDENT_AMBULATORY_CARE_PROVIDER_SITE_OTHER): Payer: Medicare Other | Admitting: Internal Medicine

## 2016-10-25 VITALS — BP 136/80 | HR 68 | Temp 98.0°F | Resp 16 | Ht 64.75 in | Wt 145.0 lb

## 2016-10-25 DIAGNOSIS — R7303 Prediabetes: Secondary | ICD-10-CM | POA: Diagnosis not present

## 2016-10-25 DIAGNOSIS — I10 Essential (primary) hypertension: Secondary | ICD-10-CM

## 2016-10-25 DIAGNOSIS — E782 Mixed hyperlipidemia: Secondary | ICD-10-CM

## 2016-10-25 DIAGNOSIS — E559 Vitamin D deficiency, unspecified: Secondary | ICD-10-CM

## 2016-10-25 LAB — HEMOGLOBIN A1C
HEMOGLOBIN A1C: 5.3 % (ref <5.7)
MEAN PLASMA GLUCOSE: 105 mg/dL

## 2016-10-25 LAB — INSULIN, RANDOM: INSULIN: 3.9 u[IU]/mL (ref 2.0–19.6)

## 2016-10-25 LAB — MAGNESIUM: Magnesium: 2 mg/dL (ref 1.5–2.5)

## 2016-10-25 LAB — VITAMIN D 25 HYDROXY (VIT D DEFICIENCY, FRACTURES): VIT D 25 HYDROXY: 60 ng/mL (ref 30–100)

## 2016-10-25 NOTE — Progress Notes (Signed)
[facility] Unk Pinto, M.D.        Uvaldo Bristle. Silverio Lay, P.A.-C       Starlyn Skeans, P.A.-C  [facility]                [address] [place], [place] [id number] Telephone [phone] Telefax [phone] ______________________________________________________________________     This very nice 82 y.o. [place] presents for 6 month  follow up with Hypertension, Hyperlipidemia, Pre-Diabetes and Vitamin D Deficiency.      Patient is treated for HTN & BP has been controlled  On low dose Inderal and  today's BP is 136/80. Patient has had no complaints of any cardiac type chest pain, palpitations, dyspnea/orthopnea/PND, dizziness, claudication, or dependent edema.     Hyperlipidemia is controlled with diet.  Last Lipids were at goal: Lab Results  Component Value Date   CHOL 164 04/15/2016   HDL 40 (L) 04/15/2016   LDLCALC 97 04/15/2016   TRIG 134 04/15/2016   CHOLHDL 4.1 04/15/2016      Also, the patient has history of PreDiabetes with A1c 5.7% in May 2016  and  She has had no symptoms of reactive hypoglycemia, diabetic polys, paresthesias or visual blurring.  Last A1c was at goal: Lab Results  Component Value Date   HGBA1C 5.3 10/24/2016      Further, the patient also has history of Vitamin D Deficiency and supplements vitamin D without any suspected side-effects. Last vitamin D was at goal: Lab Results  Component Value Date   VD25OH 60 10/24/2016   Current Outpatient Prescriptions on File Prior to Visit  Medication Sig  . aspirin 81 MG tablet Take 81 mg by mouth daily.  . Calcium-Magnesium-Vitamin D [id number] MG-MG-UNIT TABS Take by mouth daily.  . Cholecalciferol (VITAMIN D PO) Take 2,000 Units by mouth 2 (two) times daily.   . clonazePAM (KLONOPIN) 0.5 MG tablet Take 1 pill in AM, 1/2 pill at noon and 1/2 pill at night.  . Lactobacillus (PROBIOTIC ACIDOPHILUS) TABS Take by mouth daily.  .  propranolol (INDERAL) 20 MG tablet Take 1 tablet (20 mg total) by mouth 3 (three) times daily.   No current facility-administered medications on file prior to visit.    Allergies  Allergen Reactions  . Codeine Hives  . Meloxicam Nausea Only    Gastritis and nausea  . Mysoline [Primidone]   . Pneumovax 23 [Pneumococcal Vac Polyvalent]     Red, raised area.  Knot at site of injection  . Toradol [Ketorolac Tromethamine] Swelling    Localized redness at injection site.     PMHx:   Past Medical History:  Diagnosis Date  . C. difficile colitis   . Cancer [facility])    Endometrial cancer  . Essential and other specified forms of tremor   . Osteoporosis 11/2015   T score -2.8 stable from prior DEXA  . Personal history of venous thrombosis and embolism 1997  . Thrush    Immunization History  Administered Date(s) Administered  . H1N1 01/03/2009  . Influenza Whole 09/23/2013  . Influenza, High Dose Seasonal PF 09/06/2014, 08/23/2015, 08/21/2016  . PPD Test 03/29/2014  . Pneumococcal Polysaccharide-23 03/29/2014  . Pneumococcal-Unspecified 12/09/2002  . Td 03/23/2013  . Zoster 12/09/2006   Past Surgical History:  Procedure Laterality Date  . ABDOMINAL HYSTERECTOMY  1997  . APPENDECTOMY    . [place]  .  OOPHORECTOMY     BSO  . TONSILLECTOMY AND ADENOIDECTOMY     FHx:    Reviewed / unchanged  SHx:    Reviewed / unchanged  Systems Review:  Constitutional: Denies fever, chills, wt changes, headaches, insomnia, fatigue, night sweats, change in appetite. Eyes: Denies redness, blurred vision, diplopia, discharge, itchy, watery eyes.  ENT: Denies discharge, congestion, post nasal drip, epistaxis, sore throat, earache, hearing loss, dental pain, tinnitus, vertigo, sinus pain, snoring.  CV: Denies chest pain, palpitations, irregular heartbeat, syncope, dyspnea, diaphoresis, orthopnea, PND, claudication or edema. Respiratory: denies cough, dyspnea, DOE, pleurisy, hoarseness,  laryngitis, wheezing.  Gastrointestinal: Denies dysphagia, odynophagia, heartburn, reflux, water brash, abdominal pain or cramps, nausea, vomiting, bloating, diarrhea, constipation, hematemesis, melena, hematochezia  or hemorrhoids. Genitourinary: Denies dysuria, frequency, urgency, nocturia, hesitancy, discharge, hematuria or flank pain. Musculoskeletal: Denies arthralgias, myalgias, stiffness, jt. swelling, pain, limping or strain/sprain.  Skin: Denies pruritus, rash, hives, warts, acne, eczema or change in skin lesion(s). Neuro: No weakness, tremor, incoordination, spasms, paresthesia or pain. Psychiatric: Denies confusion, memory loss or sensory loss. Endo: Denies change in weight, skin or hair change.  Heme/Lymph: No excessive bleeding, bruising or enlarged lymph nodes.  Physical Exam BP 136/80   Pulse 68   Temp 98 F (36.7 C) (Temporal)   Resp 16   Ht 5' 4.75" (1.645 m)   Wt 145 lb (65.8 kg)   BMI 24.32 kg/m   Appears well nourished and in no distress.  Eyes: PERRLA, EOMs, conjunctiva no swelling or erythema. Sinuses: No frontal/maxillary tenderness ENT/Mouth: EAC's clear, TM's nl w/o erythema, bulging. Nares clear w/o erythema, swelling, exudates. Oropharynx clear without erythema or exudates. Oral hygiene is good. Tongue normal, non obstructing. Hearing intact.  Neck: Supple. Thyroid nl. Car 2+/2+ without bruits, nodes or JVD. Chest: Respirations nl with BS clear & equal w/o rales, rhonchi, wheezing or stridor.  Cor: Heart sounds normal w/ regular rate and rhythm without sig. murmurs, gallops, clicks, or rubs. Peripheral pulses normal and equal  without edema.  Abdomen: Soft & bowel sounds normal. Non-tender w/o guarding, rebound, hernias, masses, or organomegaly.  Lymphatics: Unremarkable.  Musculoskeletal: Full ROM all peripheral extremities, joint stability, 5/5 strength, and normal gait.  Skin: Warm, dry without exposed rashes, lesions or ecchymosis apparent.  Neuro:  Cranial nerves intact, reflexes equal bilaterally. Sensory-motor testing grossly intact. Tendon reflexes grossly intact.  Pysch: Alert & oriented x 3.  Insight and judgement nl & appropriate. No ideations.  Assessment and Plan:  1. Essential hypertension  - Continue medication, monitor blood pressure at home.  - Continue DASH diet. Reminder to go to the ER if any CP,  SOB, nausea, dizziness, severe HA, changes vision/speech,  left arm numbness and tingling and jaw pain.  2. Hyperlipidemia  - Continue diet/meds, exercise,& lifestyle modifications.  - Continue monitor periodic cholesterol/liver & renal functions   3. Prediabetes  - Continue diet, exercise, lifestyle modifications.  - Monitor appropriate labs.  4. Vitamin D deficiency   - Continue supplementation.       Recommended regular exercise, BP monitoring, weight control, and discussed med and SE's. Reviewed labs done yesterday with patient.  Over 30 minutes of exam, counseling, chart review was performed

## 2016-10-25 NOTE — Patient Instructions (Signed)

## 2016-10-30 ENCOUNTER — Encounter: Payer: Self-pay | Admitting: Gynecology

## 2016-10-30 ENCOUNTER — Ambulatory Visit (INDEPENDENT_AMBULATORY_CARE_PROVIDER_SITE_OTHER): Payer: Medicare Other | Admitting: Gynecology

## 2016-10-30 VITALS — BP 120/70 | Ht 64.0 in | Wt 147.0 lb

## 2016-10-30 DIAGNOSIS — M810 Age-related osteoporosis without current pathological fracture: Secondary | ICD-10-CM | POA: Diagnosis not present

## 2016-10-30 DIAGNOSIS — C541 Malignant neoplasm of endometrium: Secondary | ICD-10-CM | POA: Diagnosis not present

## 2016-10-30 DIAGNOSIS — N952 Postmenopausal atrophic vaginitis: Secondary | ICD-10-CM

## 2016-10-30 DIAGNOSIS — Z01411 Encounter for gynecological examination (general) (routine) with abnormal findings: Secondary | ICD-10-CM | POA: Diagnosis not present

## 2016-10-30 DIAGNOSIS — N368 Other specified disorders of urethra: Secondary | ICD-10-CM

## 2016-10-30 NOTE — Progress Notes (Signed)
    Monica Curry [id number]        82 y.o.  G2P2001  for annual exam.   Past medical history,surgical history, problem list, medications, allergies, family history and social history were all reviewed and documented as reviewed in the EPIC chart.  ROS:  Performed with pertinent positives and negatives included in the history, assessment and plan.   Additional significant findings :  None   Exam: [name] assistant Vitals:   10/30/16 1021  BP: 120/70  Weight: 147 lb (66.7 kg)  Height: 5\' 4"  (1.626 m)   Body mass index is 25.23 kg/m.  General appearance:  Normal affect, orientation and appearance. Skin: Grossly normal HEENT: Without gross lesions.  No cervical or supraclavicular adenopathy. Thyroid normal.  Lungs:  Clear without wheezing, rales or rhonchi Cardiac: RR, without RMG Abdominal:  Soft, nontender, without masses, guarding, rebound, organomegaly or hernia Breasts:  Examined lying and sitting without masses, retractions, discharge or axillary adenopathy. Pelvic:  Ext, BUS, Vagina with atrophic changes. Minimal urethral prolapse noted. Pap smear vaginal cuff  Adnexa without masses or tenderness    Anus and perineum normal   Rectovaginal normal sphincter tone without palpated masses or tenderness.    Assessment/Plan:  82 y.o. G19P2001 female for annual exam.   1. History of well differentiated endometrial adenocarcinoma. Primarily in situ with focal minimal myometrial invasion (2 mm) 1997. Status post TVH/BSO. Exam NED. Pap smear of cuff done. 2. Osteoporosis. DEXA 11/2015 T score -2.8 stable from prior DEXA. History of bisphosphate use over 10 years. On drug-free holiday now. Recheck DEXA next year at 2 year interval. 3. Mammography scheduled first week of December. SBE monthly reviewed. 4. Colonoscopy 2012. Repeat at their recommended interval. 5. Urethral prolapse, mild. Asymptomatic and stable on exam. Continue observation. 6. Health maintenance. No  routine lab work done as patient does this elsewhere. Follow up 1 year, sooner as needed.   Anastasio Auerbach MD, 10:41 AM 10/30/2016

## 2016-10-30 NOTE — Patient Instructions (Signed)
Followup in one year for annual exam, sooner if any issues 

## 2016-10-30 NOTE — Addendum Note (Signed)
Addended by: Nelva Nay on: 10/30/2016 10:51 AM   Modules accepted: Orders

## 2016-11-05 LAB — PAP IG W/ RFLX HPV ASCU

## 2016-12-14 ENCOUNTER — Encounter: Payer: Self-pay | Admitting: *Deleted

## 2017-01-08 ENCOUNTER — Ambulatory Visit (INDEPENDENT_AMBULATORY_CARE_PROVIDER_SITE_OTHER): Payer: Medicare Other | Admitting: Neurology

## 2017-01-08 ENCOUNTER — Encounter: Payer: Self-pay | Admitting: Neurology

## 2017-01-08 VITALS — BP 104/68 | HR 76 | Resp 14 | Ht 64.0 in | Wt 148.0 lb

## 2017-01-08 DIAGNOSIS — G250 Essential tremor: Secondary | ICD-10-CM

## 2017-01-08 MED ORDER — CLONAZEPAM 0.5 MG PO TABS: ORAL_TABLET | ORAL | 5 refills | Status: DC

## 2017-01-08 MED ORDER — PROPRANOLOL HCL 20 MG PO TABS: 20.0000 mg | ORAL_TABLET | Freq: Three times a day (TID) | ORAL | 5 refills | Status: DC

## 2017-01-08 NOTE — Patient Instructions (Addendum)
  Please remember, that any kind of tremor may be exacerbated by anxiety, anger, nervousness, excitement, dehydration, sleep deprivation, by caffeine, and low blood sugar values or blood sugar fluctuations.  Your exam is stable.  We will continue with your medications, renewed both prescriptions today.

## 2017-01-08 NOTE — Progress Notes (Signed)
Subjective:    Patient ID: Monica Curry is a 83 y.o. female.  HPI     Interim history:   Monica Curry is a very pleasant 83 year old right-handed woman with an underlying medical history of uterine cancer, anxiety and PE. She is status post hysterectomy and tonsillectomy, who presents for followup consultation of her essential tremor. She is unaccompanied today. I last saw her on 07/08/2016 at which time she reported feeling stable with regards to her tremor. We maintained her medication.  Today, 01/08/2017 (all dictated new, as well as above notes, some dictation done in note pad or Word, outside of chart, may appear as copied): She reports feeling mostly stable with her tremor, but overall, notices progression, not altogether too bad, has found was to compensate, and overall still feels, that her Inderal and clonazepam are effective. No SEs reported, needs refills today. No recent illness, just nonspecific mild cold like symptoms and feels a little lethargic overall today, BP a little lower than her typical.    The patient's allergies, current medications, family history, past medical history, past social history, past surgical history and problem list were reviewed and updated as appropriate.   Previously (copied from previous notes for reference):   I saw her on 01/08/2016, at which time she felt she was doing quite well, stable with her tremor overall but flareup notable when she was stressed out or when anxious. I suggested we continue with her current medication regimen for her tremors.    I saw her on 07/03/2015, at which time she reported having had C. 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 Tremor wise she felt stable and had no side effects on her medications. I suggested she continue with the same doses of propranolol and  low-dose clonazepam.   I saw her on 01/02/2015, at which time she reported doing well. She had tried the propranolol long-acting at one time in the past, but it did not work as well. She was prescribed trazodone 150 mg by her PCP. She tried 1/2 pill for about 2 weeks, but it did not help and made her drowsy during the day. She tried melatonin with no significant success, and she bought a white noise machine and stopped the melatonin. She had a reaction to Pneumovax in 2015 and did take the flu shot. I suggested she continue low-dose clonazepam and kept her on Inderal generic 20 mg 3 times a day.   I saw her on 06/30/14, at which time she reported doing well fro the tremor standpoint. She was having trouble going to sleep and staying asleep. She was worried about her brother who had heart surgery and was in the hospital. I kept her on propranolol and clonazepam.   I saw her on 12/31/2013, at which time I felt she was stable. I did not change her medications. I suggested a routine six-month checkup. She reported volunteering at the [facility]. She felt stable. She reported having a cousin with Parkinson's disease and one aunt with voice tremor. She had a bone density scan on 10/14/13 and it showed stable findings.    I first met her on 07/02/2013, at which time I felt her exam was stable and it did not change her medications. I had reviewed recent blood work and did not have any new test at the time. I renewed her prescriptions for  propranolol and clonazepam at the time.   She previously followed with Dr. Morene Antu and last saw him on 12/31/2012, and which time he started her on propranolol.   She felt the propranolol 60 mg long-acting once daily was not as effective and was switched to propranolol 20 mg tid, clonazepam 0.5 mg tablet one in the morning, 0.25 mg at noon and 0.25 mg in the evening. Activity makes the tremor worse. She does note an improvement with alcohol and takes only one propranolol  when she has had a drink. She drinks occasionally.   She has a history of tremors for many years, treated with clonazepam for a number of years, but tremor exacerbated in 1997 after her pulmonary embolus. She had withdrawal symptoms coming off of clonazepam. She had been on Mysoline but did not tolerate it. She has seen several other doctors for the tremor. TSH was negative in the past. She was tried on methazolamide without benefit. X ray lumbar spine from 03/24/13 showed T9 compression fracture, age indeterminate.  Her Past Medical History Is Significant For: Past Medical History:  Diagnosis Date  . C. difficile colitis   . Cancer [facility])    Endometrial cancer  . Essential and other specified forms of tremor   . Osteoporosis 11/2015   T score -2.8 stable from prior DEXA  . Personal history of venous thrombosis and embolism 1997  . Thrush     Her Past Surgical History Is Significant For: Past Surgical History:  Procedure Laterality Date  . ABDOMINAL HYSTERECTOMY  1997  . APPENDECTOMY    . [place]  . OOPHORECTOMY     BSO  . TONSILLECTOMY AND ADENOIDECTOMY      Her Family History Is Significant For: Family History  Problem Relation Age of Onset  . Cancer Mother     Unknown type  . Cancer Father     Bone  . Cancer Brother     Prostate  . Heart disease Brother   . Cancer Brother     Prostate  . Heart disease Brother   . Heart disease Sister   . Heart disease Sister   . Kidney failure Sister   . Diabetes Sister     Her Social History Is Significant For: Social History   Social History  . Marital status: Widowed    Spouse name: N/A  . Number of children: N/A  . Years of education: N/A   Social History Main Topics  . Smoking status: Never Smoker  . Smokeless tobacco: Never Used  . Alcohol use 0.0 oz/week     Comment: ocassionally  . Drug use: No  . Sexual activity: No     Comment: HYST-1st intercourse 83 yo-Fewer than 5 partners   Other Topics  Concern  . None   Social History Narrative  . None    Her Allergies Are:  Allergies  Allergen Reactions  . Codeine Hives  . Meloxicam Nausea Only    Gastritis and nausea  . Mysoline [Primidone]   . Pneumovax 23 [Pneumococcal Vac Polyvalent]     Red, raised area.  Knot at site of injection  . Toradol [Ketorolac Tromethamine] Swelling    Localized redness at injection site.    :   Her Current Medications Are:  Outpatient Encounter Prescriptions as of 01/08/2017  Medication Sig  . aspirin 81 MG tablet Take 81 mg by mouth daily.  . Calcium-Magnesium-Vitamin D 300-20-200 MG-MG-UNIT CHEW Take by mouth daily.  . Cholecalciferol (VITAMIN  D PO) Take 2,000 Units by mouth 2 (two) times daily.   . clonazePAM (KLONOPIN) 0.5 MG tablet Take 1 pill in AM, 1/2 pill at noon and 1/2 pill at night.  . Lactobacillus (PROBIOTIC ACIDOPHILUS) TABS Take by mouth as needed.   . propranolol (INDERAL) 20 MG tablet Take 1 tablet (20 mg total) by mouth 3 (three) times daily.   No facility-administered encounter medications on file as of 01/08/2017.   :  Review of Systems:  Out of a complete 14 point review of systems, all are reviewed and negative with the exception of these symptoms as listed below: Review of Systems  Neurological:       Patient states that she will need refills on her medications.  No new concerns.     Objective:  Neurologic Exam  Physical Exam Physical Examination:   Vitals:   01/08/17 1139  BP: 104/68  Pulse: 76  Resp: 14   General Examination: The patient is a very pleasant 83 y.o. female in no acute distress. She appears well-developed and well-nourished and well groomed.   HEENT: Normocephalic, atraumatic, pupils are equal, round and reactive to light and accommodation. Funduscopic exam is normal with sharp disc margins noted. Mild b/l cataracts noted. Extraocular tracking is good without limitation to gaze excursion or nystagmus noted. Normal smooth pursuit is noted.  Hearing is grossly intact. Face is symmetric with normal facial animation and normal facial sensation. Speech is clear with no dysarthria noted. There is no hypophonia. There is a mild lower lip and jaw tremor, stable, minimal voice tremor. Neck is supple with full range of passive and active motion. There are no carotid bruits on auscultation. Oropharynx exam reveals: mild mouth dryness, adequate dental hygiene and mild airway crowding. Mallampati is class II. Tongue protrudes centrally and palate elevates symmetrically.   Chest: Clear to auscultation without wheezing, rhonchi or crackles noted.  Heart: S1+S2+0, regular and normal without murmurs, rubs or gallops noted.   Abdomen: Soft, non-tender and non-distended with normal bowel sounds appreciated on auscultation.  Extremities: There is no pitting edema in the distal lower extremities bilaterally. Pedal pulses are intact.  Skin: Warm and dry without trophic changes noted.  Musculoskeletal: exam reveals no obvious joint deformities, tenderness or joint swelling or erythema.   Neurologically:  Mental status: The patient is awake, alert and oriented in all 4 spheres. Her immediate and remote memory, attention, language skills and fund of knowledge are appropriate. There is no evidence of aphasia, agnosia, apraxia or anomia. Speech is clear with normal prosody and enunciation. Thought process is linear. Mood is normal and affect is normal.  Cranial nerves II - XII are as described above under HEENT exam. In addition: shoulder shrug is normal with equal shoulder height noted. Motor exam: Normal bulk, strength and tone is noted. There is no drift, or resting tremor or rebound. She has a mild and stable postural and action tremor, romberg is negative. Reflexes are 2+ throughout. Fine motor skills and coordination: intact with normal finger taps, normal hand movements, normal rapid alternating patting, normal foot taps and normal foot agility.   Cerebellar testing: No dysmetria or intention tremor on finger to nose testing. Heel to shin is unremarkable bilaterally. There is no truncal or gait ataxia.  Sensory exam: intact to light touch, pinprick, vibration, temperature in the upper and lower extremities.  Gait, station and balance: She stands easily. No veering to one side is noted. No leaning to one side is noted. Posture is  age-appropriate and stance is narrow based. Gait shows normal stride length and normal pace. No problems turning are noted. Turns well, good balance.                Assessment and Plan:   In summary, Monica Curry is a very pleasant 83 year old female with an underlying medical history of anxiety, osteoporosis, history of DVT and PE, hypertension, hyperlipidemia, and vitamin D deficiency, s/p C section, Appendectomy, hysterectomy, tonsillectomy, BSO, who presents for follow-up consultation of her essential tremor. She has had very little and slow progression over years and overall continues to have benefit from the symptomatic medications, low-dose generic Inderal and low-dose generic clonazepam. She needed refills today, physical exam is for the most part stable, tremors stable, again, no evidence of parkinsonism. She is advised to try to work on her balance and her day-to-day activities, she is advised to stay well-hydrated, blood pressure little on the low side for her today but she has no specific symptoms. I renewed her prescription for propranolol 20 g 3 times a day and clonazepam 0.5 mg 1 in the morning and half a pill at lunch and evening. I suggested a 6 month follow-up, sooner as needed. I answered all her questions today and she was in agreement. I spent 25 minutes in total face-to-face time with the patient, more than 50% of which was spent in counseling and coordination of care, reviewing test results, reviewing medication and discussing or reviewing the diagnosis of ET, its prognosis and treatment options.  Pertinent laboratory and imaging test results that were available during this visit with the patient were reviewed by me and considered in my medical decision making (see chart for details).

## 2017-04-22 ENCOUNTER — Encounter: Payer: Self-pay | Admitting: Internal Medicine

## 2017-04-23 ENCOUNTER — Encounter: Payer: Self-pay | Admitting: Internal Medicine

## 2017-05-05 NOTE — Progress Notes (Signed)
Patient ID: Monica Curry, female   DOB: 11/10/1931, 83 y.o.   MRN: [id number]  Complete physical   Assessment:    Essential hypertension - continue medications, DASH diet, exercise and monitor at home. Call if greater than 130/80.   Hyperlipidemia -continue medications, check lipids, decrease fatty foods, increase activity.    Prediabetes Discussed general issues about diabetes pathophysiology and management., Educational material distributed., Suggested low cholesterol diet., Encouraged aerobic exercise., Discussed foot care., Reminded to get yearly retinal exam.  Vitamin D deficiency Continue supplement   Essential tremor Continue medications  Osteoporosis Will get DEXA this year   Medication management   Hx/o DVT/PE  Urethral prolapse S/p hysterectomy   Endometrial cancer ([place]) Continue to follow up with Dr. Phineas Real   HT CKD 3 (GFR 69ml/min) Increase fluids, avoid NSAIDS, monitor sugars, will monitor  Future Appointments Date Time Provider [place]  07/16/2017 10:30 AM [name], NP [facility] None  11/03/2017 10:30 AM Fontaine, Belinda Block, MD [facility]  11/20/2017 11:00 AM [facility] BONE DENSITY RM [id number] None  05/07/2018 3:00 PM Vicie Mutters, PA-C [facility] None     Plan:   During the course of the visit the patient was educated and counseled about appropriate screening and preventive services including:    Pneumococcal vaccine   Influenza vaccine  Td vaccine  Screening electrocardiogram  Bone densitometry screening  Colorectal cancer screening  Diabetes screening  Glaucoma screening  Nutrition counseling   Advanced directives: requested   Subjective:   Monica Curry presents for CPE and follow up.   Patient has mild labile HTN. BP has been controlled and today's BP: 110/66. Patient has had no complaints of any cardiac type chest pain, palpitations, dyspnea/orthopnea/PND, dizziness, claudication, or dependent  edema.  Hyperlipidemia is controlled with diet.  Last Lipids were at goal -  Lab Results  Component Value Date   CHOL 164 04/15/2016   HDL 40 (L) 04/15/2016   LDLCALC 97 04/15/2016   TRIG 134 04/15/2016   CHOLHDL 4.1 04/15/2016   Also, the patient has history of PreDiabetes and has had no symptoms of reactive hypoglycemia, diabetic polys, paresthesias or visual blurring.   Lab Results  Component Value Date   HGBA1C 5.3 10/24/2016   Lab Results  Component Value Date   GFRNONAA 46 (L) 10/24/2016   Further, the patient also has history of Vitamin D Deficiency and supplements vitamin D without any suspected side-effects.  Lab Results  Component Value Date   VD25OH 60 10/24/2016   BMI is Body mass index is 25.51 kg/m., she is working on diet and exercise. Wt Readings from Last 3 Encounters:  05/06/17 148 lb 9.6 oz (67.4 kg)  01/08/17 148 lb (67.1 kg)  10/30/16 147 lb (66.7 kg)    Names of Other Physician/Practitioners you currently use: 1. [facility] here for primary care 2. Dr Rutherford Guys , eye doctor, last visit 2016 3. Dr Ennis Forts, [place], dentist, last visit Feb 2016 & every 4 months  Patient Care Team: Unk Pinto, MD as PCP - General (Internal Medicine) Ronald Lobo, MD as Consulting Physician (Gastroenterology) Rutherford Guys, MD as Consulting Physician (Ophthalmology) Clance, Armando Reichert, MD as Consulting Physician (Pulmonary Disease) Fontaine, Belinda Block, MD as Consulting Physician (Gynecology) Love, Alyson Locket, MD as Consulting Physician (Neurology) [name], MD as Attending Physician (Neurology) Harriett Sine, MD as Consulting Physician (Dermatology)  Medication Review: Current Outpatient Prescriptions on File Prior to Visit  Medication Sig  . aspirin 81 MG tablet  Take 81 mg by mouth daily.  . Calcium-Magnesium-Vitamin D 300-20-200 MG-MG-UNIT CHEW Take by mouth daily.  . Cholecalciferol (VITAMIN D PO) Take 2,000 Units  by mouth 2 (two) times daily.   . clonazePAM (KLONOPIN) 0.5 MG tablet Take 1 pill in AM, 1/2 pill at noon and 1/2 pill at night.  . Lactobacillus (PROBIOTIC ACIDOPHILUS) TABS Take by mouth as needed.   . propranolol (INDERAL) 20 MG tablet Take 1 tablet (20 mg total) by mouth 3 (three) times daily.   No current facility-administered medications on file prior to visit.      Allergies  Allergen Reactions  . Codeine Hives  . Meloxicam Nausea Only    Gastritis and nausea  . Mysoline [Primidone]   . Pneumovax 23 [Pneumococcal Vac Polyvalent]     Red, raised area.  Knot at site of injection  . Toradol [Ketorolac Tromethamine] Swelling    Localized redness at injection site.     Current Problems (verified) Patient Active Problem List   Diagnosis Date Noted  .  HT CKD 3 (GFR 16ml/min) 07/21/2015  . Medicare annual wellness visit, subsequent 07/21/2015  . Medication management 07/04/2014  . Essential hypertension 11/25/2013  . Hyperlipidemia 11/25/2013  . Prediabetes 11/25/2013  . Vitamin D deficiency 11/25/2013  . Essential tremor 11/25/2013  . Endometrial cancer ([place])   . Hx/o DVT/PE   . Urethral prolapse   . Osteoporosis 10/10/2011    Screening Tests Immunization History  Administered Date(s) Administered  . H1N1 01/03/2009  . Influenza Whole 09/23/2013  . Influenza, High Dose Seasonal PF 09/06/2014, 08/23/2015, 08/21/2016  . PPD Test 03/29/2014  . Pneumococcal Polysaccharide-23 03/29/2014  . Pneumococcal-Unspecified 12/09/2002  . Td 03/23/2013  . Zoster 12/09/2006   Preventative care: Last colonoscopy: 2011 [facility] 11/2016 DEXA 2016, will get with Dr. Phineas Real PAP 10/2016 negative  Influenza 2017 TD 2014 Pneumonia 2015 prevnar 13 has allergy Zoster 2008  Allergies Allergies  Allergen Reactions  . Codeine Hives  . Meloxicam Nausea Only    Gastritis and nausea  . Mysoline [Primidone]   . Pneumovax 23 [Pneumococcal Vac Polyvalent]     Red, raised area.  Knot at  site of injection  . Toradol [Ketorolac Tromethamine] Swelling    Localized redness at injection site.      SURGICAL HISTORY She  has a past surgical history that includes Cesarean section (1961); Abdominal hysterectomy (1997); Oophorectomy; Tonsillectomy and adenoidectomy; and Appendectomy. FAMILY HISTORY Her family history includes Cancer in her brother, brother, father, and mother; Diabetes in her sister; Heart disease in her brother, brother, sister, and sister; Kidney failure in her sister. SOCIAL HISTORY She  reports that she has never smoked. She has never used smokeless tobacco. She reports that she drinks alcohol. She reports that she does not use drugs.'   Objective:     Blood pressure 110/66, pulse 67, temperature 97.5 F (36.4 C), resp. rate 16, height 5\' 4"  (1.626 m), weight 148 lb 9.6 oz (67.4 kg), SpO2 95 %.   General Appearance: Well nourished, alert, WD/WN, female and in no apparent distress. Eyes: PERRLA, EOMs, conjunctiva no swelling or erythema, normal fundi and vessels. Sinuses: No frontal/maxillary tenderness ENT/Mouth: EACs patent / TMs  nl. Nares clear without erythema, swelling, mucoid exudates. Oral hygiene is good. No erythema, swelling, or exudate. Tongue normal, non-obstructing. Tonsils not swollen or erythematous. Hearing normal.  Neck: Supple, thyroid normal. No bruits, nodes or JVD. Respiratory: Respiratory effort normal.  BS equal and clear bilateral without rales, rhonci, wheezing or  stridor. Cardio: Heart sounds are normal with regular rate and rhythm and no murmurs, rubs or gallops. Peripheral pulses are normal and equal bilaterally without edema. No aortic or femoral bruits. Chest: symmetric with normal excursions and percussion. Abdomen: Flat, soft  with nl bowel sounds. Nontender, no guarding, rebound, hernias, masses, or organomegaly.  Lymphatics: Non tender without lymphadenopathy.  Musculoskeletal: Full ROM all peripheral extremities, joint  stability, 5/5 strength, and normal gait. Skin: Warm and dry without rashes, lesions, cyanosis, clubbing or  ecchymosis.  Neuro: Cranial nerves intact, reflexes equal bilaterally. Normal muscle tone, no cerebellar symptoms. Sensation intact.  Pysch: Alert and oriented X 3, normal affect, Insight and Judgment appropriate.    The patient's weight, height, BMI, and visual acuity have been recorded in the chart.  I have made referrals, counseling, and provided education to the patient based on review of the above and I have provided the patient with a written personalized care plan for preventive services.  Over 40 minutes of exam, counseling, chart review was performed.   Vicie Mutters, PA-C   05/06/2017

## 2017-05-06 ENCOUNTER — Ambulatory Visit (INDEPENDENT_AMBULATORY_CARE_PROVIDER_SITE_OTHER): Payer: Medicare Other | Admitting: Physician Assistant

## 2017-05-06 ENCOUNTER — Encounter: Payer: Self-pay | Admitting: Physician Assistant

## 2017-05-06 VITALS — BP 110/66 | HR 67 | Temp 97.5°F | Resp 16 | Ht 64.0 in | Wt 148.6 lb

## 2017-05-06 DIAGNOSIS — R7303 Prediabetes: Secondary | ICD-10-CM

## 2017-05-06 DIAGNOSIS — N183 Chronic kidney disease, stage 3 unspecified: Secondary | ICD-10-CM

## 2017-05-06 DIAGNOSIS — Z0000 Encounter for general adult medical examination without abnormal findings: Secondary | ICD-10-CM | POA: Diagnosis not present

## 2017-05-06 DIAGNOSIS — Z136 Encounter for screening for cardiovascular disorders: Secondary | ICD-10-CM | POA: Diagnosis not present

## 2017-05-06 DIAGNOSIS — Z86718 Personal history of other venous thrombosis and embolism: Secondary | ICD-10-CM

## 2017-05-06 DIAGNOSIS — E559 Vitamin D deficiency, unspecified: Secondary | ICD-10-CM

## 2017-05-06 DIAGNOSIS — I10 Essential (primary) hypertension: Secondary | ICD-10-CM | POA: Diagnosis not present

## 2017-05-06 DIAGNOSIS — Z79899 Other long term (current) drug therapy: Secondary | ICD-10-CM

## 2017-05-06 DIAGNOSIS — G250 Essential tremor: Secondary | ICD-10-CM

## 2017-05-06 DIAGNOSIS — N368 Other specified disorders of urethra: Secondary | ICD-10-CM

## 2017-05-06 DIAGNOSIS — E782 Mixed hyperlipidemia: Secondary | ICD-10-CM

## 2017-05-06 DIAGNOSIS — C541 Malignant neoplasm of endometrium: Secondary | ICD-10-CM

## 2017-05-06 DIAGNOSIS — M810 Age-related osteoporosis without current pathological fracture: Secondary | ICD-10-CM

## 2017-05-06 LAB — CBC WITH DIFFERENTIAL/PLATELET
BASOS PCT: 1 %
Basophils Absolute: 57 cells/uL (ref 0–200)
EOS PCT: 4 %
Eosinophils Absolute: 228 cells/uL (ref 15–500)
HCT: 44 % (ref 35.0–45.0)
Hemoglobin: 14.3 g/dL (ref 11.7–15.5)
LYMPHS ABS: 2622 {cells}/uL (ref 850–3900)
LYMPHS PCT: 46 %
MCH: 28.8 pg (ref 27.0–33.0)
MCHC: 32.5 g/dL (ref 32.0–36.0)
MCV: 88.5 fL (ref 80.0–100.0)
MPV: 9.7 fL (ref 7.5–12.5)
Monocytes Absolute: 513 cells/uL (ref 200–950)
Monocytes Relative: 9 %
NEUTROS PCT: 40 %
Neutro Abs: 2280 cells/uL (ref 1500–7800)
PLATELETS: 270 10*3/uL (ref 140–400)
RBC: 4.97 MIL/uL (ref 3.80–5.10)
RDW: 15.1 % — AB (ref 11.0–15.0)
WBC: 5.7 10*3/uL (ref 3.8–10.8)

## 2017-05-06 LAB — TSH: TSH: 2.47 mIU/L

## 2017-05-06 NOTE — Patient Instructions (Signed)

## 2017-05-07 LAB — HEPATIC FUNCTION PANEL
ALT: 10 U/L (ref 6–29)
AST: 14 U/L (ref 10–35)
Albumin: 4.2 g/dL (ref 3.6–5.1)
Alkaline Phosphatase: 36 U/L (ref 33–130)
BILIRUBIN DIRECT: 0.1 mg/dL (ref <=0.2)
BILIRUBIN INDIRECT: 0.4 mg/dL (ref 0.2–1.2)
BILIRUBIN TOTAL: 0.5 mg/dL (ref 0.2–1.2)
Total Protein: 6.9 g/dL (ref 6.1–8.1)

## 2017-05-07 LAB — URINALYSIS, ROUTINE W REFLEX MICROSCOPIC
BILIRUBIN URINE: NEGATIVE
GLUCOSE, UA: NEGATIVE
Hgb urine dipstick: NEGATIVE
Ketones, ur: NEGATIVE
Leukocytes, UA: NEGATIVE
Nitrite: NEGATIVE
PH: 5.5 (ref 5.0–8.0)
PROTEIN: NEGATIVE
Specific Gravity, Urine: 1.014 (ref 1.001–1.035)

## 2017-05-07 LAB — LIPID PANEL
CHOL/HDL RATIO: 4 ratio (ref <5.0)
CHOLESTEROL: 177 mg/dL (ref <200)
HDL: 44 mg/dL — AB (ref >50)
LDL Cholesterol: 100 mg/dL — ABNORMAL HIGH (ref <100)
TRIGLYCERIDES: 163 mg/dL — AB (ref <150)
VLDL: 33 mg/dL — AB (ref <30)

## 2017-05-07 LAB — VITAMIN D 25 HYDROXY (VIT D DEFICIENCY, FRACTURES): VIT D 25 HYDROXY: 58 ng/mL (ref 30–100)

## 2017-05-07 LAB — BASIC METABOLIC PANEL WITH GFR
BUN: 23 mg/dL (ref 7–25)
CALCIUM: 9.6 mg/dL (ref 8.6–10.4)
CO2: 26 mmol/L (ref 20–31)
Chloride: 103 mmol/L (ref 98–110)
Creat: 1.17 mg/dL — ABNORMAL HIGH (ref 0.60–0.88)
GFR, EST AFRICAN AMERICAN: 50 mL/min — AB (ref >=60)
GFR, EST NON AFRICAN AMERICAN: 43 mL/min — AB (ref >=60)
Glucose, Bld: 90 mg/dL (ref 65–99)
Potassium: 5 mmol/L (ref 3.5–5.3)
SODIUM: 139 mmol/L (ref 135–146)

## 2017-05-07 LAB — MICROALBUMIN / CREATININE URINE RATIO
Creatinine, Urine: 85 mg/dL (ref 20–320)
Microalb Creat Ratio: 2 mcg/mg creat (ref <30)
Microalb, Ur: 0.2 mg/dL

## 2017-05-07 LAB — MAGNESIUM: Magnesium: 2 mg/dL (ref 1.5–2.5)

## 2017-05-09 NOTE — Progress Notes (Signed)
Pt aware of lab results & voiced understanding of those results.

## 2017-07-16 ENCOUNTER — Ambulatory Visit (INDEPENDENT_AMBULATORY_CARE_PROVIDER_SITE_OTHER): Payer: Medicare Other | Admitting: Adult Health

## 2017-07-16 ENCOUNTER — Encounter (INDEPENDENT_AMBULATORY_CARE_PROVIDER_SITE_OTHER): Payer: Self-pay

## 2017-07-16 ENCOUNTER — Encounter: Payer: Self-pay | Admitting: Adult Health

## 2017-07-16 DIAGNOSIS — G250 Essential tremor: Secondary | ICD-10-CM

## 2017-07-16 MED ORDER — PROPRANOLOL HCL 20 MG PO TABS: 20.0000 mg | ORAL_TABLET | Freq: Three times a day (TID) | ORAL | 5 refills | Status: DC

## 2017-07-16 MED ORDER — CLONAZEPAM 0.5 MG PO TABS: ORAL_TABLET | ORAL | 5 refills | Status: DC

## 2017-07-16 NOTE — Progress Notes (Addendum)
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 is an 83 year old female with a history of essential tremor. She returns today for follow-up. She remains on propranolol and clonazepam. She feels that her tremor has remained relatively stable. She does report that she has some days that the tremor is worse. She does report that it primarily affects her hands. She does note some difficulty with writing. She also notices some difficulty when reaching or lifting objects. She does find that the tremor is more pronounced when eating. She states that she has learned how to compensate when the tremor is worse. She denies any new neurological symptoms. She returns today for an evaluation.  HISTORY Copied from Dr. Guadelupe Sabin notes: [name] is a very pleasant 83 year old right-handed woman with an underlying medical history of uterine cancer, anxiety and PE. She is status post hysterectomy and tonsillectomy, who presents for followup consultation of her essential tremor. She is unaccompanied today. I last saw her on 07/08/2016 at which time she reported feeling stable with regards to her tremor. We maintained her medication.   01/08/2017 :She reports feeling mostly stable with her tremor, but overall, notices progression, not altogether too bad, has found was to compensate, and overall still feels, that her Inderal and clonazepam are effective. No SEs reported, needs refills today. No recent illness, just nonspecific mild cold like symptoms and feels a little lethargic overall today, BP a little lower than her typical.    REVIEW OF SYSTEMS: Out of a complete 14 system review of symptoms, the patient complains only of the following symptoms, and all other reviewed systems are negative.  See history of present illness  ALLERGIES: Allergies  Allergen Reactions  . Codeine Hives  .  Meloxicam Nausea Only    Gastritis and nausea  . Mysoline [Primidone]   . Pneumovax 23 [Pneumococcal Vac Polyvalent]     Red, raised area.  Knot at site of injection  . Toradol [Ketorolac Tromethamine] Swelling    Localized redness at injection site.      HOME MEDICATIONS: Outpatient Medications Prior to Visit  Medication Sig Dispense Refill  . aspirin 81 MG tablet Take 81 mg by mouth daily.    . Calcium-Magnesium-Vitamin D 300-20-200 MG-MG-UNIT CHEW Take by mouth daily.    . Cholecalciferol (VITAMIN D PO) Take 2,000 Units by mouth 2 (two) times daily.     . clonazePAM (KLONOPIN) 0.5 MG tablet Take 1 pill in AM, 1/2 pill at noon and 1/2 pill at night. 60 tablet 5  . Lactobacillus (PROBIOTIC ACIDOPHILUS) TABS Take by mouth as needed.     . propranolol (INDERAL) 20 MG tablet Take 1 tablet (20 mg total) by mouth 3 (three) times daily. 90 tablet 5   No facility-administered medications prior to visit.     PAST MEDICAL HISTORY: Past Medical History:  Diagnosis Date  . C. difficile colitis   . Cancer [facility])    Endometrial cancer  . Essential and other specified forms of tremor   . Osteoporosis 11/2015   T score -2.8 stable from prior DEXA  . Personal history of venous thrombosis and embolism 1997  . Thrush     PAST SURGICAL HISTORY: Past Surgical History:  Procedure Laterality Date  . ABDOMINAL HYSTERECTOMY  1997  . APPENDECTOMY    . [place]  . OOPHORECTOMY     BSO  . TONSILLECTOMY AND  ADENOIDECTOMY      FAMILY HISTORY: Family History  Problem Relation Age of Onset  . Cancer Mother        Unknown type  . Cancer Father        Bone  . Cancer Brother        Prostate  . Heart disease Brother   . Cancer Brother        Prostate  . Heart disease Brother   . Heart disease Sister   . Heart disease Sister   . Kidney failure Sister   . Diabetes Sister     SOCIAL HISTORY: Social History   Social History  . Marital status: Widowed    Spouse name: N/A    . Number of children: N/A  . Years of education: N/A   Occupational History  . Not on file.   Social History Main Topics  . Smoking status: Never Smoker  . Smokeless tobacco: Never Used  . Alcohol use 0.0 oz/week     Comment: ocassionally  . Drug use: No  . Sexual activity: No     Comment: HYST-1st intercourse 83 yo-Fewer than 5 partners   Other Topics Concern  . Not on file   Social History Narrative  . No narrative on file      PHYSICAL EXAM  There were no vitals filed for this visit. There is no height or weight on file to calculate BMI.  Generalized: Well developed, in no acute distress   Neurological examination  Mentation: Alert oriented to time, place, history taking. Follows all commands speech and language fluent Cranial nerve II-XII: Pupils were equal round reactive to light. Extraocular movements were full, visual field were full on confrontational test. Facial sensation and strength were normal. Uvula tongue midline. Head turning and shoulder shrug  were normal and symmetric. Mild tremor in jaw.  Motor: The motor testing reveals 5 over 5 strength of all 4 extremities. Good symmetric motor tone is noted throughout.  Sensory: Sensory testing is intact to soft touch on all 4 extremities. No evidence of extinction is noted.  Coordination: Cerebellar testing reveals good finger-nose-finger and heel-to-shin bilaterally. Action tremor noted in both hands.  Gait and station: Gait is normal. Tandem gait is normal. Romberg is negative. No drift is seen.  Reflexes: Deep tendon reflexes are symmetric and normal bilaterally.   DIAGNOSTIC DATA (LABS, IMAGING, TESTING) - I reviewed patient records, labs, notes, testing and imaging myself where available.  Lab Results  Component Value Date   WBC 5.7 05/06/2017   HGB 14.3 05/06/2017   HCT 44.0 05/06/2017   MCV 88.5 05/06/2017   PLT 270 05/06/2017      Component Value Date/Time   NA 139 05/06/2017 1603   K 5.0  05/06/2017 1603   CL 103 05/06/2017 1603   CO2 26 05/06/2017 1603   GLUCOSE 90 05/06/2017 1603   BUN 23 05/06/2017 1603   CREATININE 1.17 (H) 05/06/2017 1603   CALCIUM 9.6 05/06/2017 1603   PROT 6.9 05/06/2017 1603   ALBUMIN 4.2 05/06/2017 1603   AST 14 05/06/2017 1603   ALT 10 05/06/2017 1603   ALKPHOS 36 05/06/2017 1603   BILITOT 0.5 05/06/2017 1603   GFRNONAA 43 (L) 05/06/2017 1603   GFRAA 50 (L) 05/06/2017 1603   Lab Results  Component Value Date   CHOL 177 05/06/2017   HDL 44 (L) 05/06/2017   LDLCALC 100 (H) 05/06/2017   TRIG 163 (H) 05/06/2017   CHOLHDL 4.0 05/06/2017   Lab  Results  Component Value Date   HGBA1C 5.3 10/24/2016   No results found for: [id number] Lab Results  Component Value Date   TSH 2.47 05/06/2017      ASSESSMENT AND PLAN 83 y.o. year old female  has a past medical history of C. difficile colitis; Cancer ([place]); Essential and other specified forms of tremor; Osteoporosis (11/2015); Personal history of venous thrombosis and embolism (1997); and Thrush. here with:  1. Essential tremor  Overall the patient is doing well. She'll continue on propranolol and clonazepam. Her tremor has remained stable. She is advised that if her symptoms worsen she should let us know. She will follow-up in 6 months or sooner if needed.  I spent 15 minutes with the patient. 50% of this time was spent reviewing her medication   [name], MSN, NP-C 07/16/2017, 10:15 AM [facility] [address], [place], [place] [address] [phone]  I reviewed the above note and documentation by the Nurse Practitioner and agree with the history, physical exam, assessment and plan as outlined above. I was immediately available for face-to-face consultation. [name], MD, PhD [facility])

## 2017-07-16 NOTE — Patient Instructions (Signed)
Continue Propranolol and Klonopin  If your symptoms worsen or you develop new symptoms please let us know.

## 2017-08-01 ENCOUNTER — Other Ambulatory Visit: Payer: Self-pay | Admitting: Internal Medicine

## 2017-08-01 ENCOUNTER — Other Ambulatory Visit: Payer: Medicare Other

## 2017-08-01 DIAGNOSIS — Z23 Encounter for immunization: Secondary | ICD-10-CM

## 2017-08-04 NOTE — Progress Notes (Signed)
Pt aware of lab results & voiced understanding of those results.

## 2017-08-07 LAB — VARICELLA ZOSTER ANTIBODY, IGG: VARICELLA IGG: 2782 {index}

## 2017-08-27 ENCOUNTER — Ambulatory Visit: Payer: Self-pay

## 2017-08-28 ENCOUNTER — Ambulatory Visit (INDEPENDENT_AMBULATORY_CARE_PROVIDER_SITE_OTHER): Payer: Medicare Other

## 2017-08-28 DIAGNOSIS — Z23 Encounter for immunization: Secondary | ICD-10-CM | POA: Diagnosis not present

## 2017-10-23 ENCOUNTER — Other Ambulatory Visit: Payer: Self-pay

## 2017-10-23 ENCOUNTER — Observation Stay (HOSPITAL_COMMUNITY)
Admission: EM | Admit: 2017-10-23 | Discharge: 2017-10-24 | Disposition: A | Discharge status: Home / Self Care | Payer: Medicare Other | Attending: Internal Medicine | Admitting: Internal Medicine

## 2017-10-23 ENCOUNTER — Emergency Department (HOSPITAL_COMMUNITY): Payer: Medicare Other

## 2017-10-23 DIAGNOSIS — G250 Essential tremor: Secondary | ICD-10-CM | POA: Diagnosis present

## 2017-10-23 DIAGNOSIS — N1832 Chronic kidney disease, stage 3b: Secondary | ICD-10-CM | POA: Diagnosis present

## 2017-10-23 DIAGNOSIS — N183 Chronic kidney disease, stage 3 (moderate): Secondary | ICD-10-CM | POA: Insufficient documentation

## 2017-10-23 DIAGNOSIS — Z86718 Personal history of other venous thrombosis and embolism: Secondary | ICD-10-CM

## 2017-10-23 DIAGNOSIS — G459 Transient cerebral ischemic attack, unspecified: Secondary | ICD-10-CM | POA: Diagnosis not present

## 2017-10-23 DIAGNOSIS — I10 Essential (primary) hypertension: Secondary | ICD-10-CM | POA: Diagnosis present

## 2017-10-23 DIAGNOSIS — Z8673 Personal history of transient ischemic attack (TIA), and cerebral infarction without residual deficits: Secondary | ICD-10-CM | POA: Diagnosis present

## 2017-10-23 DIAGNOSIS — R4701 Aphasia: Secondary | ICD-10-CM | POA: Diagnosis present

## 2017-10-23 DIAGNOSIS — Z7982 Long term (current) use of aspirin: Secondary | ICD-10-CM | POA: Diagnosis not present

## 2017-10-23 DIAGNOSIS — I129 Hypertensive chronic kidney disease with stage 1 through stage 4 chronic kidney disease, or unspecified chronic kidney disease: Secondary | ICD-10-CM | POA: Insufficient documentation

## 2017-10-23 DIAGNOSIS — Z79899 Other long term (current) drug therapy: Secondary | ICD-10-CM | POA: Diagnosis not present

## 2017-10-23 DIAGNOSIS — Z86711 Personal history of pulmonary embolism: Secondary | ICD-10-CM | POA: Insufficient documentation

## 2017-10-23 LAB — COMPREHENSIVE METABOLIC PANEL
ALK PHOS: 37 U/L — AB (ref 38–126)
ALT: 14 U/L (ref 14–54)
AST: 21 U/L (ref 15–41)
Albumin: 4.4 g/dL (ref 3.5–5.0)
Anion gap: 8 (ref 5–15)
BUN: 23 mg/dL — AB (ref 6–20)
CHLORIDE: 105 mmol/L (ref 101–111)
CO2: 25 mmol/L (ref 22–32)
CREATININE: 1.14 mg/dL — AB (ref 0.44–1.00)
Calcium: 9.5 mg/dL (ref 8.9–10.3)
GFR calc Af Amer: 50 mL/min — ABNORMAL LOW (ref >60)
GFR, EST NON AFRICAN AMERICAN: 43 mL/min — AB (ref >60)
Glucose, Bld: 106 mg/dL — ABNORMAL HIGH (ref 65–99)
Potassium: 4.3 mmol/L (ref 3.5–5.1)
Sodium: 138 mmol/L (ref 135–145)
Total Bilirubin: 0.6 mg/dL (ref 0.3–1.2)
Total Protein: 7.4 g/dL (ref 6.5–8.1)

## 2017-10-23 LAB — CBC
HEMATOCRIT: 44 % (ref 36.0–46.0)
HEMOGLOBIN: 14.6 g/dL (ref 12.0–15.0)
MCH: 29.6 pg (ref 26.0–34.0)
MCHC: 33.2 g/dL (ref 30.0–36.0)
MCV: 89.1 fL (ref 78.0–100.0)
Platelets: 255 10*3/uL (ref 150–400)
RBC: 4.94 MIL/uL (ref 3.87–5.11)
RDW: 14.3 % (ref 11.5–15.5)
WBC: 5.8 10*3/uL (ref 4.0–10.5)

## 2017-10-23 LAB — DIFFERENTIAL
BASOS ABS: 0 10*3/uL (ref 0.0–0.1)
BASOS PCT: 1 %
Eosinophils Absolute: 0.2 10*3/uL (ref 0.0–0.7)
Eosinophils Relative: 3 %
LYMPHS PCT: 50 %
Lymphs Abs: 2.9 10*3/uL (ref 0.7–4.0)
MONOS PCT: 8 %
Monocytes Absolute: 0.5 10*3/uL (ref 0.1–1.0)
NEUTROS ABS: 2.2 10*3/uL (ref 1.7–7.7)
Neutrophils Relative %: 38 %

## 2017-10-23 LAB — CBG MONITORING, ED: Glucose-Capillary: 101 mg/dL — ABNORMAL HIGH (ref 65–99)

## 2017-10-23 LAB — I-STAT CHEM 8, ED
BUN: 25 mg/dL — AB (ref 6–20)
CALCIUM ION: 1.22 mmol/L (ref 1.15–1.40)
CHLORIDE: 105 mmol/L (ref 101–111)
CREATININE: 1.1 mg/dL — AB (ref 0.44–1.00)
GLUCOSE: 105 mg/dL — AB (ref 65–99)
HCT: 45 % (ref 36.0–46.0)
Hemoglobin: 15.3 g/dL — ABNORMAL HIGH (ref 12.0–15.0)
POTASSIUM: 4.3 mmol/L (ref 3.5–5.1)
Sodium: 140 mmol/L (ref 135–145)
TCO2: 27 mmol/L (ref 22–32)

## 2017-10-23 LAB — PROTIME-INR
INR: 1.06
Prothrombin Time: 13.7 seconds (ref 11.4–15.2)

## 2017-10-23 LAB — APTT: APTT: 28 s (ref 24–36)

## 2017-10-23 LAB — I-STAT TROPONIN, ED: TROPONIN I, POC: 0 ng/mL (ref 0.00–0.08)

## 2017-10-23 MED ORDER — ACETAMINOPHEN 325 MG PO TABS: 650.0000 mg | ORAL_TABLET | ORAL | Status: DC | PRN

## 2017-10-23 MED ORDER — ACETAMINOPHEN 160 MG/5ML PO SOLN: 650.0000 mg | ORAL | Status: DC | PRN

## 2017-10-23 MED ORDER — ASPIRIN 300 MG RE SUPP: 300.0000 mg | Freq: Every day | RECTAL | Status: DC

## 2017-10-23 MED ORDER — STROKE: EARLY STAGES OF RECOVERY BOOK
Freq: Once | Status: AC
  Administered 2017-10-23: 22:00:00

## 2017-10-23 MED ORDER — CLONAZEPAM 0.5 MG PO TABS: 0.5000 mg | ORAL_TABLET | Freq: Every morning | ORAL | Status: DC

## 2017-10-23 MED ORDER — CLONAZEPAM 0.5 MG PO TABS: 0.2500 mg | ORAL_TABLET | Freq: Once | ORAL | Status: DC

## 2017-10-23 MED ORDER — ASPIRIN 81 MG PO CHEW
324.0000 mg | CHEWABLE_TABLET | Freq: Once | ORAL | Status: AC
  Administered 2017-10-23: 324 mg via ORAL
  Filled 2017-10-23: qty 4

## 2017-10-23 MED ORDER — PROPRANOLOL HCL 20 MG PO TABS
20.0000 mg | ORAL_TABLET | Freq: Three times a day (TID) | ORAL | Status: DC
  Administered 2017-10-23 – 2017-10-24 (×3): 20 mg via ORAL
  Filled 2017-10-23 (×4): qty 1

## 2017-10-23 MED ORDER — CLONAZEPAM 0.5 MG PO TABS: 0.5000 mg | ORAL_TABLET | Freq: Once | ORAL | Status: DC

## 2017-10-23 MED ORDER — CLONAZEPAM 0.5 MG PO TABS
0.2500 mg | ORAL_TABLET | Freq: Every day | ORAL | Status: DC
  Administered 2017-10-24: 0.25 mg via ORAL
  Filled 2017-10-23: qty 1

## 2017-10-23 MED ORDER — ASPIRIN 325 MG PO TABS
325.0000 mg | ORAL_TABLET | Freq: Every day | ORAL | Status: DC
  Administered 2017-10-24: 325 mg via ORAL
  Filled 2017-10-23: qty 1

## 2017-10-23 MED ORDER — ACETAMINOPHEN 650 MG RE SUPP: 650.0000 mg | RECTAL | Status: DC | PRN

## 2017-10-23 NOTE — Consult Note (Signed)
Neurology Consultation  Reason for Consult: Sudden onset of word finding difficulty and slurred speech Referring Physician: Dr. Loralee Pacas  CC: Sudden onset slurred speech and word finding difficulty  History is obtained from: Chart, patient  HPI: Jackie Russman is a 83 y.o. female who has a history of essential tremor currently on propranolol and Klonopin, postoperative DVT and PE in 1994, presented to the emergency room at [facility] when she had sudden onset of what she described as confusion, word finding difficulty and slurred speech when she was with her stepdaughter.  Patient reports that she did not feel right, and could not find the words to say.  Her stepdaughter who was with her noticed that the speech coming out of her mouth made no sense and was slurred.  This episode lasted about 5-10 minutes according to her, the chart says 3-5 minutes-and then completely resolved.  She reports she is back at her normal baseline. At baseline, she lives by herself ever since her husband died 62 years ago.  She is able to take care of all her ADLs and finances by herself. She denied any similar episodes in the past. With today's episode, she denies any visual symptoms, headaches, tingling, numbness, weakness.  She denies any nausea vomiting.  She denies any sensation of vertigo. She denies any chest pain or shortness of breath.  She denies any cough fever chills.  She denies any abdominal pain. Of note, on arrival to [facility] emergency department, her systolic blood pressure was 706, diastolic 90.  She does not have a history of hypertension.  She is very concerned as she did not know why her blood pressures will be still high. She has been on propranolol and Klonopin for her essential tremor which is managed by an outpatient neurologist Dr. Erling Cruz at [facility] -she has been taking these medications for years.  She had tried primidone in the past which gave her side effects and she  discontinued it. She was on anticoagulation for many years after 1994 for her DVT/PE.  She was put on Coumadin at that time.  She discontinued Coumadin many years after and was put on full dose aspirin for many subsequent years.  Following which, she is now on baby aspirin.   LKW: 1730 hrs. on 10/23/2017 tpa given?: no, complete resolution of symptoms Premorbid modified Rankin scale (mRS): 0  ROS: A 14 point ROS was performed and is negative except as noted in the HPI.  Past Medical History:  Diagnosis Date  . C. difficile colitis   . Cancer [facility])    Endometrial cancer  . Essential and other specified forms of tremor   . Osteoporosis 11/2015   T score -2.8 stable from prior DEXA  . Personal history of venous thrombosis and embolism 1997  . Thrush     Family History  Problem Relation Age of Onset  . Cancer Mother        Unknown type  . Cancer Father        Bone  . Cancer Brother        Prostate  . Heart disease Brother   . Cancer Brother        Prostate  . Heart disease Brother   . Heart disease Sister   . Heart disease Sister   . Kidney failure Sister   . Diabetes Sister     Social History:   reports that  has never smoked. she has never used smokeless tobacco.  She reports that she drinks alcohol. She reports that she does not use drugs.  Medications  Current Facility-Administered Medications:  .  acetaminophen (TYLENOL) tablet 650 mg, 650 mg, Oral, Q4H PRN **OR** acetaminophen (TYLENOL) solution 650 mg, 650 mg, Per Tube, Q4H PRN **OR** acetaminophen (TYLENOL) suppository 650 mg, 650 mg, Rectal, Q4H PRN, Derrill Kay A, MD .  aspirin suppository 300 mg, 300 mg, Rectal, Daily **OR** aspirin tablet 325 mg, 325 mg, Oral, Daily, Derrill Kay A, MD .  clonazePAM (KLONOPIN) tablet 0.5 mg, 0.5 mg, Oral, Once, Bodenheimer, Charles A, NP .  propranolol (INDERAL) tablet 20 mg, 20 mg, Oral, TID, Phillips Grout, MD, 20 mg at 10/23/17 2222   Exam: Current vital signs: BP  (!) 208/68 (BP Location: Right Arm)   Pulse 60   Temp 97.7 F (36.5 C)   Resp 16   Ht 5\' 4"  (1.626 m)   Wt 70.9 kg (156 lb 4.9 oz)   SpO2 98%   BMI 26.83 kg/m  Vital signs in last 24 hours: Temp:  [97.7 F (36.5 C)] 97.7 F (36.5 C) (11/15 1946) Pulse Rate:  [58-89] 60 (11/15 2129) Resp:  [16-24] 16 (11/15 2129) BP: (118-213)/(68-99) 208/68 (11/15 2129) SpO2:  [97 %-100 %] 98 % (11/15 2129) Weight:  [70.9 kg (156 lb 4.9 oz)] 70.9 kg (156 lb 4.9 oz) (11/15 2129)  GENERAL: Awake, alert in NAD HEENT: - Normocephalic and atraumatic, dry mm, no LN++, no Thyromegally LUNGS - Clear to auscultation bilaterally with no wheezes CV - S1S2 RRR, no m/r/g, equal pulses bilaterally. ABDOMEN - Soft, nontender, nondistended with normoactive BS Ext: warm, well perfused, intact peripheral pulses, no edema  NEURO:  Mental Status: AA&Ox3  Language: speech is clear.  Naming, repetition, fluency, and comprehension intact. Cranial Nerves: PERRL. EOMI, visual fields full, no facial asymmetry,facial sensation intact, hearing intact, tongue/uvula/soft palate midline, normal sternocleidomastoid and trapezius muscle strength. No evidence of tongue atrophy or fibrillations -she does have some perioral fine tremor-attributes it to her essential tremor. Motor: 5/5 all over Tone: is normal and bulk is normal Sensation- Intact to light touch bilaterally Coordination: FTN shows action and intention tremor bilaterally, no dysmetria., no ataxia in BLE. Gait- deferred  NIHSS -0   Labs I have reviewed labs in epic and the results pertinent to this consultation are: Hemoglobin 15.3, BUN 25, creatinine 1.10, elevated alkaline phosphatase at 37 and reduced GFR at 43. CBC    Component Value Date/Time   WBC 5.8 10/23/2017 1807   RBC 4.94 10/23/2017 1807   HGB 15.3 (H) 10/23/2017 1824   HCT 45.0 10/23/2017 1824   PLT 255 10/23/2017 1807   MCV 89.1 10/23/2017 1807   MCH 29.6 10/23/2017 1807   MCHC 33.2  10/23/2017 1807   RDW 14.3 10/23/2017 1807   LYMPHSABS 2.9 10/23/2017 1807   MONOABS 0.5 10/23/2017 1807   EOSABS 0.2 10/23/2017 1807   BASOSABS 0.0 10/23/2017 1807    CMP     Component Value Date/Time   NA 140 10/23/2017 1824   K 4.3 10/23/2017 1824   CL 105 10/23/2017 1824   CO2 25 10/23/2017 1807   GLUCOSE 105 (H) 10/23/2017 1824   BUN 25 (H) 10/23/2017 1824   CREATININE 1.10 (H) 10/23/2017 1824   CREATININE 1.17 (H) 05/06/2017 1603   CALCIUM 9.5 10/23/2017 1807   PROT 7.4 10/23/2017 1807   ALBUMIN 4.4 10/23/2017 1807   AST 21 10/23/2017 1807   ALT 14 10/23/2017 1807   ALKPHOS 37 (L)  10/23/2017 1807   BILITOT 0.6 10/23/2017 1807   GFRNONAA 43 (L) 10/23/2017 1807   GFRNONAA 43 (L) 05/06/2017 1603   GFRAA 50 (L) 10/23/2017 1807   GFRAA 50 (L) 05/06/2017 1603     Lipid Panel     Component Value Date/Time   CHOL 177 05/06/2017 1603   TRIG 163 (H) 05/06/2017 1603   HDL 44 (L) 05/06/2017 1603   CHOLHDL 4.0 05/06/2017 1603   VLDL 33 (H) 05/06/2017 1603   LDLCALC 100 (H) 05/06/2017 1603     Imaging I have reviewed the images obtained: CT-scan of the brain -showed no acute changes  Assessment:  A very pleasant 83 year old woman who has a past medical history of essential tremor, remote DVT/PE not on anticoagulation anymore, presented to the emergency room at [facility] for a 3-10-minute episode of word finding difficulty and slurred speech.  Her symptoms completely resolved. Blood pressure on arrival-systolic 696. No focal sensory or motor signs.  No focal cranial nerve abnormalities.This episode could represent a TIA versus hypertensive urgency.  Impression: Evaluate for TIA Evaluate for hypertensive urgency  Recommendations: -Telemetry monitoring -Allow for permissive hypertension for the first 24-48h - only treat PRN if SBP >220 mmHg. Blood pressures can be gradually normalized to SBP<140 upon discharge. -MRI, MRA  of the brain without  contrast -Carotid dopplers -Echocardiogram -HgbA1c, fasting lipid panel -Frequent neuro checks -Prophylactic therapy-Antiplatelet med: Aspirin - dose 325mg  PO or 300mg  PR -Atorvastatin 80 mg PO daily -Risk factor modification -PT consult, OT consult, Speech consult -For essential tremor, continue home dose of propranolol and home dose of Klonopin-Klonopin regimen is 0.5 mg every morning, 0.25 mg q. afternoon, 0.25 mg nightly.  Please page stroke NP/PA/MD (listed on AMION)  from 8am-4 pm as this patient will be followed by the stroke team at this point.  -- Amie Portland, MD [facility] [phone] If 7pm to 7am, please call on call as listed on AMION.

## 2017-10-23 NOTE — ED Provider Notes (Addendum)
[facility] DEPT Provider Note   CSN: [id number] Arrival date & time: 10/23/17  1747   An emergency department physician performed an initial assessment on this suspected stroke patient at 1815.  History   Chief Complaint Chief Complaint  Patient presents with  . Aphasia    HPI Monica Curry is a 83 y.o. female.  HPI Patient with history of essential tremor, remote  DVT comes in with chief complaint of confusion. According to the patient's stepdaughter, she noted patient having difficulty expressing herself and getting the right words out.  The symptoms lasted for about 3-5 minutes.  When patient did finally start speaking she was having mumbled words, and incomprehensible words.  Patient had no associated numbness, weakness, tingling, vision change.  No history of strokes.   Past Medical History:  Diagnosis Date  . C. difficile colitis   . Cancer [facility])    Endometrial cancer  . Essential and other specified forms of tremor   . Osteoporosis 11/2015   T score -2.8 stable from prior DEXA  . Personal history of venous thrombosis and embolism 1997  . Thrush     Patient Active Problem List   Diagnosis Date Noted  .  HT CKD 3 (GFR 93ml/min) 07/21/2015  . Medicare annual wellness visit, subsequent 07/21/2015  . Medication management 07/04/2014  . Essential hypertension 11/25/2013  . Hyperlipidemia 11/25/2013  . Prediabetes 11/25/2013  . Vitamin D deficiency 11/25/2013  . Essential tremor 11/25/2013  . Endometrial cancer ([place])   . Hx/o DVT/PE   . Urethral prolapse   . Osteoporosis 10/10/2011    Past Surgical History:  Procedure Laterality Date  . ABDOMINAL HYSTERECTOMY  1997  . APPENDECTOMY    . [place]  . OOPHORECTOMY     BSO  . TONSILLECTOMY AND ADENOIDECTOMY      OB History    Gravida Para Term Preterm AB Living   2 2 2     1    SAB TAB Ectopic Multiple Live Births                   Home Medications     Prior to Admission medications   Medication Sig Start Date End Date Taking? Authorizing Provider  aspirin 81 MG tablet Take 81 mg at bedtime by mouth.    Yes [provider]  Calcium-Magnesium-Vitamin D 300-20-200 MG-MG-UNIT CHEW Chew 1 tablet daily by mouth.    Yes [provider]  Cholecalciferol (VITAMIN D3) 5000 units TABS Take 1 tablet daily by mouth.   Yes [provider]  clonazePAM (KLONOPIN) 0.5 MG tablet Take 1 pill in AM, 1/2 pill at noon and 1/2 pill at night. 07/16/17  Yes [name], NP  propranolol (INDERAL) 20 MG tablet Take 1 tablet (20 mg total) by mouth 3 (three) times daily. 07/16/17  Yes [name], NP  Cholecalciferol (VITAMIN D PO) Take 2,000 Units by mouth 2 (two) times daily.     [provider]  Lactobacillus (PROBIOTIC ACIDOPHILUS) TABS Take by mouth as needed.     [provider]    Family History Family History  Problem Relation Age of Onset  . Cancer Mother        Unknown type  . Cancer Father        Bone  . Cancer Brother        Prostate  . Heart disease Brother   . Cancer Brother        Prostate  .  Heart disease Brother   . Heart disease Sister   . Heart disease Sister   . Kidney failure Sister   . Diabetes Sister     Social History Social History   Tobacco Use  . Smoking status: Never Smoker  . Smokeless tobacco: Never Used  Substance Use Topics  . Alcohol use: Yes    Alcohol/week: 0.0 oz    Comment: ocassionally  . Drug use: No     Allergies   Codeine; Meloxicam; Mysoline [primidone]; Pneumovax 23 [pneumococcal vac polyvalent]; and Toradol [ketorolac tromethamine]   Review of Systems Review of Systems  Constitutional: Positive for activity change.  Respiratory: Negative for shortness of breath.   Cardiovascular: Negative for chest pain.  Gastrointestinal: Negative for nausea.  Neurological: Positive for speech difficulty.     Physical Exam Updated Vital Signs BP 118/75  (BP Location: Left Arm)   Pulse 89   Temp 97.7 F (36.5 C) (Oral)   Resp 19   SpO2 100%   Physical Exam  Constitutional: She is oriented to person, place, and time. She appears well-developed.  HENT:  Head: Normocephalic and atraumatic.  Eyes: EOM are normal.  Neck: Normal range of motion. Neck supple.  Cardiovascular: Normal rate.  Pulmonary/Chest: Effort normal.  Abdominal: Bowel sounds are normal.  Neurological: She is alert and oriented to person, place, and time. No cranial nerve deficit. Coordination normal.  Cerebellar exam is normal (finger to nose) Sensory exam normal for bilateral upper and lower extremities - and patient is able to discriminate between sharp and dull. Motor exam is 4+/5   Skin: Skin is warm and dry.  Nursing note and vitals reviewed.    ED Treatments / Results  Labs (all labs ordered are listed, but only abnormal results are displayed) Labs Reviewed  COMPREHENSIVE METABOLIC PANEL - Abnormal; Notable for the following components:      Result Value   Glucose, Bld 106 (*)    BUN 23 (*)    Creatinine, Ser 1.14 (*)    Alkaline Phosphatase 37 (*)    GFR calc non Af Amer 43 (*)    GFR calc Af Amer 50 (*)    All other components within normal limits  CBG MONITORING, ED - Abnormal; Notable for the following components:   Glucose-Capillary 101 (*)    All other components within normal limits  I-STAT CHEM 8, ED - Abnormal; Notable for the following components:   BUN 25 (*)    Creatinine, Ser 1.10 (*)    Glucose, Bld 105 (*)    Hemoglobin 15.3 (*)    All other components within normal limits  CBC  DIFFERENTIAL  PROTIME-INR  APTT  I-STAT TROPONIN, ED    EKG  EKG Interpretation  Date/Time:  Thursday October 23 2017 18:07:19 EST Ventricular Rate:  62 PR Interval:    QRS Duration: 139 QT Interval:  470 QTC Calculation: 478 R Axis:   117 Text Interpretation:  Sinus rhythm Right bundle branch block No acute changes No old tracing to compare  Confirmed by Varney Biles [phone]) on 10/23/2017 7:01:23 PM       Radiology Ct Head Code Stroke Wo Contrast  Result Date: 10/23/2017 CLINICAL DATA:  Code stroke. Acute onset slurred speech at 1730 hours. RIGHT facial droop. History of hypertension, hyperlipidemia, tremor and cancer. EXAM: CT HEAD WITHOUT CONTRAST TECHNIQUE: Contiguous axial images were obtained from the base of the skull through the vertex without intravenous contrast. COMPARISON:  None. FINDINGS: BRAIN: No intraparenchymal hemorrhage, mass  effect nor midline shift. The ventricles and sulci are normal for age. Patchy supratentorial white matter hypodensities less than expected for patient's age, though non-specific are most compatible with chronic small vessel ischemic disease. No acute large vascular territory infarcts. No abnormal extra-axial fluid collections. Basal cisterns are patent. VASCULAR: Mild calcific atherosclerosis of the carotid siphons. SKULL: No skull fracture. Moderate RIGHT temporomandibular osteoarthrosis. No significant scalp soft tissue swelling. SINUSES/ORBITS: The mastoid air-cells and included paranasal sinuses are well-aerated.The included ocular globes and orbital contents are non-suspicious. OTHER: None. ASPECTS [facility] Stroke Program Early CT Score) - Ganglionic level infarction (caudate, lentiform nuclei, internal capsule, insula, M1-M3 cortex): 7 - Supraganglionic infarction (M4-M6 cortex): 3 Total score (0-10 with 10 being normal): 10 IMPRESSION: 1. Normal noncontrast CT HEAD for age. 2. ASPECTS is 10. 3. Critical Value/emergent results were called by telephone at the time of interpretation on 10/23/2017 at 6:35 pm to Dr. Varney Biles , who verbally acknowledged these results. Electronically Signed   By: Elon Alas M.D.   On: 10/23/2017 18:36    Procedures Procedures (including critical care time)  Medications Ordered in ED Medications  aspirin chewable tablet 324 mg (not administered)      Initial Impression / Assessment and Plan / ED Course  I have reviewed the triage vital signs and the nursing notes.  Pertinent labs & imaging results that were available during my care of the patient were reviewed by me and considered in my medical decision making (see chart for details).     Pt comes in with cc of slurred speech, and what appears to be a aphasia.  Suspicion for TIA, given that patient has NIH stroke scale of 0 and resolution of her symptoms. ABCD 2 score is 3, which puts patient at low risk. Spoke with neurology, and given that a aphasia is related to possible cortical strokes and embolic event he recommends that patient be admitted for TIA and transferred to [facility].  Final Clinical Impressions(s) / ED Diagnoses   Final diagnoses:  TIA (transient ischemic attack)    ED Discharge Orders    None       Varney Biles, MD 10/23/17 [place], Ithiel Liebler, MD 10/23/17 1902

## 2017-10-23 NOTE — ED Notes (Signed)
[facility] called. 

## 2017-10-23 NOTE — H&P (Signed)
History and Physical    Monica Curry UUV:[id number] DOB: [birth year] DOA: 10/23/2017  PCP: Unk Pinto, MD  Patient coming from:  home  Chief Complaint:  Couldn't speak  HPI: Monica Curry is a 83 y.o. female with medical history significant of HTN, PE many years ago postoperatively comes in with sudden onset of expressive aphasia while with her step daughter.  They came immediatedly to the [facility] long ED as it was closest to them.  By the time of arrival her aphasia had resolved.  She is now back to normal.  She denies any numbness, tingling, or weakness with it.  She denies any facial drooping or facial abnormalities with it although facial droop is noted by ED staff.  She has not h/o tia/stroke in the past.  She takes a baby aspirin a day.  Pt referred for admission for likely TIA.  Review of Systems: As per HPI otherwise 10 point review of systems negative.   Past Medical History:  Diagnosis Date  . C. difficile colitis   . Cancer [facility])    Endometrial cancer  . Essential and other specified forms of tremor   . Osteoporosis 11/2015   T score -2.8 stable from prior DEXA  . Personal history of venous thrombosis and embolism 1997  . Thrush     Past Surgical History:  Procedure Laterality Date  . ABDOMINAL HYSTERECTOMY  1997  . APPENDECTOMY    . [place]  . OOPHORECTOMY     BSO  . TONSILLECTOMY AND ADENOIDECTOMY       reports that  has never smoked. she has never used smokeless tobacco. She reports that she drinks alcohol. She reports that she does not use drugs.  Allergies  Allergen Reactions  . Codeine Hives  . Meloxicam Nausea Only    Gastritis and nausea  . Mysoline [Primidone]   . Pneumovax 23 [Pneumococcal Vac Polyvalent]     Red, raised area.  Knot at site of injection  . Toradol [Ketorolac Tromethamine] Swelling    Localized redness at injection site.      Family History  Problem Relation Age of Onset  . Cancer Mother        Unknown  type  . Cancer Father        Bone  . Cancer Brother        Prostate  . Heart disease Brother   . Cancer Brother        Prostate  . Heart disease Brother   . Heart disease Sister   . Heart disease Sister   . Kidney failure Sister   . Diabetes Sister     Prior to Admission medications   Medication Sig Start Date End Date Taking? Authorizing Provider  aspirin 81 MG tablet Take 81 mg at bedtime by mouth.    Yes [provider]  Calcium-Magnesium-Vitamin D 300-20-200 MG-MG-UNIT CHEW Chew 1 tablet daily by mouth.    Yes [provider]  Cholecalciferol (VITAMIN D3) 5000 units TABS Take 1 tablet daily by mouth.   Yes [provider]  clonazePAM (KLONOPIN) 0.5 MG tablet Take 1 pill in AM, 1/2 pill at noon and 1/2 pill at night. 07/16/17  Yes [name], NP  propranolol (INDERAL) 20 MG tablet Take 1 tablet (20 mg total) by mouth 3 (three) times daily. 07/16/17  Yes [name], NP  Cholecalciferol (VITAMIN D PO) Take 2,000 Units by mouth 2 (two) times daily.     [provider]  Lactobacillus (  PROBIOTIC ACIDOPHILUS) TABS Take by mouth as needed.     [provider]    Physical Exam: Vitals:   10/23/17 1753 10/23/17 1840 10/23/17 1932  BP: (!) 213/90 118/75 (!) 169/99  Pulse: 88 89 75  Resp: 20 19 (!) 24  Temp: 97.7 F (36.5 C)    TempSrc: Oral    SpO2: 100% 100% 100%      Constitutional: NAD, calm, comfortable Vitals:   10/23/17 1753 10/23/17 1840 10/23/17 1932  BP: (!) 213/90 118/75 (!) 169/99  Pulse: 88 89 75  Resp: 20 19 (!) 24  Temp: 97.7 F (36.5 C)    TempSrc: Oral    SpO2: 100% 100% 100%   Eyes: PERRL, lids and conjunctivae normal ENMT: Mucous membranes are moist. Posterior pharynx clear of any exudate or lesions.Normal dentition.  Neck: normal, supple, no masses, no thyromegaly Respiratory: clear to auscultation bilaterally, no wheezing, no crackles. Normal respiratory effort. No accessory muscle use.    Cardiovascular: Regular rate and rhythm, no murmurs / rubs / gallops. No extremity edema. 2+ pedal pulses. No carotid bruits.  Abdomen: no tenderness, no masses palpated. No hepatosplenomegaly. Bowel sounds positive.  Musculoskeletal: no clubbing / cyanosis. No joint deformity upper and lower extremities. Good ROM, no contractures. Normal muscle tone.  Skin: no rashes, lesions, ulcers. No induration Neurologic: CN 2-12 grossly intact. Sensation intact, DTR normal. Strength 5/5 in all 4.  Psychiatric: Normal judgment and insight. Alert and oriented x 3. Normal mood.    Labs on Admission: I have personally reviewed following labs and imaging studies  CBC: Recent Labs  Lab 10/23/17 1807 10/23/17 1824  WBC 5.8  --   NEUTROABS 2.2  --   HGB 14.6 15.3*  HCT 44.0 45.0  MCV 89.1  --   PLT 255  --    Basic Metabolic Panel: Recent Labs  Lab 10/23/17 1807 10/23/17 1824  NA 138 140  K 4.3 4.3  CL 105 105  CO2 25  --   GLUCOSE 106* 105*  BUN 23* 25*  CREATININE 1.14* 1.10*  CALCIUM 9.5  --    GFR: CrCl cannot be calculated (Unknown ideal weight.). Liver Function Tests: Recent Labs  Lab 10/23/17 1807  AST 21  ALT 14  ALKPHOS 37*  BILITOT 0.6  PROT 7.4  ALBUMIN 4.4   No results for input(s): LIPASE, AMYLASE in the last 168 hours. No results for input(s): AMMONIA in the last 168 hours. Coagulation Profile: Recent Labs  Lab 10/23/17 1807  INR 1.06   Cardiac Enzymes: No results for input(s): CKTOTAL, CKMB, CKMBINDEX, TROPONINI in the last 168 hours. BNP (last 3 results) No results for input(s): PROBNP in the last 8760 hours. HbA1C: No results for input(s): HGBA1C in the last 72 hours. CBG: Recent Labs  Lab 10/23/17 1810  GLUCAP 101*   Lipid Profile: No results for input(s): CHOL, HDL, LDLCALC, TRIG, CHOLHDL, LDLDIRECT in the last 72 hours. Thyroid Function Tests: No results for input(s): TSH, T4TOTAL, FREET4, T3FREE, THYROIDAB in the last 72 hours. Anemia  Panel: No results for input(s): VITAMINB12, FOLATE, FERRITIN, TIBC, IRON, RETICCTPCT in the last 72 hours. Urine analysis:    Component Value Date/Time   COLORURINE YELLOW 05/06/2017 [place] 05/06/2017 1603   LABSPEC 1.014 05/06/2017 1603   PHURINE 5.5 05/06/2017 1603   GLUCOSEU NEGATIVE 05/06/2017 1603   HGBUR NEGATIVE 05/06/2017 1603   BILIRUBINUR NEGATIVE 05/06/2017 1603   KETONESUR NEGATIVE 05/06/2017 1603   PROTEINUR NEGATIVE 05/06/2017 1603  UROBILINOGEN 0.2 04/19/2015 1103   NITRITE NEGATIVE 05/06/2017 1603   LEUKOCYTESUR NEGATIVE 05/06/2017 1603   Sepsis Labs: !!!!!!!!!!!!!!!!!!!!!!!!!!!!!!!!!!!!!!!!!!!! @LABRCNTIP (procalcitonin:4,lacticidven:4) )No results found for this or any previous visit (from the past 240 hour(s)).   Radiological Exams on Admission: Ct Head Code Stroke Wo Contrast  Result Date: 10/23/2017 CLINICAL DATA:  Code stroke. Acute onset slurred speech at 1730 hours. RIGHT facial droop. History of hypertension, hyperlipidemia, tremor and cancer. EXAM: CT HEAD WITHOUT CONTRAST TECHNIQUE: Contiguous axial images were obtained from the base of the skull through the vertex without intravenous contrast. COMPARISON:  None. FINDINGS: BRAIN: No intraparenchymal hemorrhage, mass effect nor midline shift. The ventricles and sulci are normal for age. Patchy supratentorial white matter hypodensities less than expected for patient's age, though non-specific are most compatible with chronic small vessel ischemic disease. No acute large vascular territory infarcts. No abnormal extra-axial fluid collections. Basal cisterns are patent. VASCULAR: Mild calcific atherosclerosis of the carotid siphons. SKULL: No skull fracture. Moderate RIGHT temporomandibular osteoarthrosis. No significant scalp soft tissue swelling. SINUSES/ORBITS: The mastoid air-cells and included paranasal sinuses are well-aerated.The included ocular globes and orbital contents are non-suspicious.  OTHER: None. ASPECTS [facility] Stroke Program Early CT Score) - Ganglionic level infarction (caudate, lentiform nuclei, internal capsule, insula, M1-M3 cortex): 7 - Supraganglionic infarction (M4-M6 cortex): 3 Total score (0-10 with 10 being normal): 10 IMPRESSION: 1. Normal noncontrast CT HEAD for age. 2. ASPECTS is 10. 3. Critical Value/emergent results were called by telephone at the time of interpretation on 10/23/2017 at 6:35 pm to Dr. Varney Biles , who verbally acknowledged these results. Electronically Signed   By: Elon Alas M.D.   On: 10/23/2017 18:36    EKG: Independently reviewed. nrs rbbb Old chart reviewed Case discussed with dr Nicholas Lose in ED   Assessment/Plan 83 yo female with transient episode of expressive aphasia now resolved c/w TIA  Principal Problem:   TIA (transient ischemic attack)- transfer to [facility].  Neuro aware and consulted.  Obtain mri brain, carotid u/s and cardiac echo.  Full dosing of aspirin.  Ck flp and hga1c in am.  freq neuro checks.  Swallow screen.  Cardiac monitoring.  Active Problems:   Hx/o DVT/PE- noted, scds   Essential hypertension- noted   Essential tremor- noted    HT CKD 3 (GFR 61ml/min)- stable   Home med list is pending   DVT prophylaxis:  scds Code Status:  full Family Communication:  None Disposition Plan:  Per day team Consults called:  neuro Admission status:  observation   Sharad Vaneaton A MD [facility]  If 7PM-7AM, please contact night-coverage [email or web address] Password [facility]  10/23/2017, 7:38 PM

## 2017-10-23 NOTE — ED Triage Notes (Signed)
Per  Pt, had sudden slurred speech around 1730 with difficulty getting out words. Denies weakness or LOC. Possibly slight right sided facial droop.

## 2017-10-24 ENCOUNTER — Observation Stay (HOSPITAL_COMMUNITY): Payer: Medicare Other

## 2017-10-24 ENCOUNTER — Observation Stay (HOSPITAL_BASED_OUTPATIENT_CLINIC_OR_DEPARTMENT_OTHER): Payer: Medicare Other

## 2017-10-24 ENCOUNTER — Other Ambulatory Visit: Payer: Self-pay

## 2017-10-24 ENCOUNTER — Ambulatory Visit (HOSPITAL_BASED_OUTPATIENT_CLINIC_OR_DEPARTMENT_OTHER): Payer: Medicare Other

## 2017-10-24 ENCOUNTER — Encounter (HOSPITAL_COMMUNITY): Payer: Self-pay | Admitting: *Deleted

## 2017-10-24 DIAGNOSIS — G459 Transient cerebral ischemic attack, unspecified: Secondary | ICD-10-CM | POA: Diagnosis not present

## 2017-10-24 DIAGNOSIS — N183 Chronic kidney disease, stage 3 (moderate): Secondary | ICD-10-CM | POA: Diagnosis not present

## 2017-10-24 DIAGNOSIS — G250 Essential tremor: Secondary | ICD-10-CM | POA: Diagnosis not present

## 2017-10-24 DIAGNOSIS — Z86718 Personal history of other venous thrombosis and embolism: Secondary | ICD-10-CM | POA: Diagnosis not present

## 2017-10-24 DIAGNOSIS — I10 Essential (primary) hypertension: Secondary | ICD-10-CM | POA: Diagnosis not present

## 2017-10-24 LAB — LIPID PANEL
Cholesterol: 166 mg/dL (ref 0–200)
HDL: 37 mg/dL — AB (ref >40)
LDL CALC: 105 mg/dL — AB (ref 0–99)
Total CHOL/HDL Ratio: 4.5 RATIO
Triglycerides: 122 mg/dL (ref <150)
VLDL: 24 mg/dL (ref 0–40)

## 2017-10-24 LAB — D-DIMER, QUANTITATIVE (NOT AT ARMC): D DIMER QUANT: 0.34 ug{FEU}/mL (ref 0.00–0.50)

## 2017-10-24 LAB — ECHOCARDIOGRAM COMPLETE
HEIGHTINCHES: 64 in
WEIGHTICAEL: 2500.9 [oz_av]

## 2017-10-24 LAB — HEMOGLOBIN A1C
HEMOGLOBIN A1C: 5.5 % (ref 4.8–5.6)
MEAN PLASMA GLUCOSE: 111.15 mg/dL

## 2017-10-24 MED ORDER — ATORVASTATIN CALCIUM 80 MG PO TABS: 80.0000 mg | ORAL_TABLET | Freq: Every day | ORAL | Status: DC

## 2017-10-24 MED ORDER — ATORVASTATIN CALCIUM 10 MG PO TABS: 10.0000 mg | ORAL_TABLET | Freq: Every day | ORAL | 0 refills | Status: DC

## 2017-10-24 MED ORDER — CLONAZEPAM 0.5 MG PO TABS
0.5000 mg | ORAL_TABLET | Freq: Every day | ORAL | Status: DC
  Administered 2017-10-24: 0.5 mg via ORAL
  Filled 2017-10-24: qty 1

## 2017-10-24 MED ORDER — ASPIRIN 325 MG PO TABS: 325.0000 mg | ORAL_TABLET | Freq: Every day | ORAL | 0 refills | Status: DC

## 2017-10-24 NOTE — Progress Notes (Signed)
STROKE TEAM PROGRESS NOTE   SUBJECTIVE (INTERVAL HISTORY) No family at the bedside.  Patient found laying in bed in NAD Overall she feels her condition is completely resolved.  Voices no new complaints. No new events reported overnight.  OBJECTIVE Recent Labs  Lab 10/23/17 1810  GLUCAP 101*   Recent Labs  Lab 10/23/17 1807 10/23/17 1824  NA 138 140  K 4.3 4.3  CL 105 105  CO2 25  --   GLUCOSE 106* 105*  BUN 23* 25*  CREATININE 1.14* 1.10*  CALCIUM 9.5  --    Recent Labs  Lab 10/23/17 1807  AST 21  ALT 14  ALKPHOS 37*  BILITOT 0.6  PROT 7.4  ALBUMIN 4.4   Recent Labs  Lab 10/23/17 1807 10/23/17 1824  WBC 5.8  --   NEUTROABS 2.2  --   HGB 14.6 15.3*  HCT 44.0 45.0  MCV 89.1  --   PLT 255  --    No results for input(s): CKTOTAL, CKMB, CKMBINDEX, TROPONINI in the last 168 hours. Recent Labs    10/23/17 1807  LABPROT 13.7  INR 1.06   No results for input(s): COLORURINE, LABSPEC, PHURINE, GLUCOSEU, HGBUR, BILIRUBINUR, KETONESUR, PROTEINUR, UROBILINOGEN, NITRITE, LEUKOCYTESUR in the last 72 hours.  Invalid input(s): APPERANCEUR     Component Value Date/Time   CHOL 166 10/24/2017 0629   TRIG 122 10/24/2017 0629   HDL 37 (L) 10/24/2017 0629   CHOLHDL 4.5 10/24/2017 0629   VLDL 24 10/24/2017 0629   LDLCALC 105 (H) 10/24/2017 0629   Lab Results  Component Value Date   HGBA1C 5.5 10/24/2017   No results found for: LABOPIA, COCAINSCRNUR, LABBENZ, AMPHETMU, THCU, LABBARB  No results for input(s): ETH in the last 168 hours.  IMAGING: I have personally reviewed the radiological images below and agree with the radiology interpretations.  Mr Brain Wo Contrast  Result Date: 10/24/2017 CLINICAL DATA:  Initial evaluation for acute onset expressive aphasia, now resolved. EXAM: MRI HEAD WITHOUT CONTRAST MRA HEAD WITHOUT CONTRAST TECHNIQUE: Multiplanar, multiecho pulse sequences of the brain and surrounding structures were obtained without intravenous contrast.  Angiographic images of the head were obtained using MRA technique without contrast. COMPARISON:  Comparison made with prior CT from 10/23/2017. FINDINGS: MRI HEAD FINDINGS Brain: Generalized age-related cerebral atrophy. Patchy and confluent T2/FLAIR hyperintensity within the periventricular, deep, and subcortical white matter both cerebral hemispheres, most likely related chronic small vessel ischemic disease, mild to moderate nature. No abnormal foci of restricted diffusion to suggest acute or subacute ischemia. Gray-white matter differentiation maintained. No encephalomalacia to suggest chronic infarction. No evidence for acute or chronic intracranial hemorrhage. Susceptibility artifact along the tentorium consistent with scattered calcifications as seen on prior CT. No mass lesion, midline shift or mass effect. No hydrocephalus. No extra-axial fluid collection. Major dural sinuses grossly patent. Pituitary gland suprasellar region normal. Midline structures intact and normal. Vascular: Major intracranial vascular flow voids maintained. Skull and upper cervical spine: Craniocervical junction normal. Upper cervical spine within normal limits. Bone marrow signal intensity normal. No scalp soft tissue abnormality. Sinuses/Orbits: Globes and orbital soft tissues within normal limits. Paranasal sinuses clear. No mastoid effusion. Inner ear structures normal. Other:  None. MRA HEAD FINDINGS ANTERIOR CIRCULATION: Distal cervical segments of the internal carotid arteries are patent with antegrade flow. Petrous, cavernous, and supraclinoid segments patent without flow-limiting stenosis. Saccular aneurysm arising from the supraclinoid left ICA measures 5 x 5 mm (series 4, image 108). This is directed laterally and slightly anteriorly. Second 3  x 2 mm aneurysm arises from the supraclinoid right ICA (series 4, image 114). ICA termini patent. A1 segments normal. Anterior communicating artery within normal limits. Anterior  cerebral artery is patent to their distal aspects without stenosis. M1 segments patent without stenosis. Normal MCA bifurcations. Distal MCA branches well perfused and symmetric. POSTERIOR CIRCULATION: Vertebral arteries patent to the vertebrobasilar junction without stenosis. Right vertebral artery dominant. Right PICA patent. Left PICA not visualized. Basilar artery widely patent to its distal aspect. Dominant left AICA. Superior cerebral arteries patent bilaterally. Both of the posterior cerebral artery supplied via the basilar and are well perfused to their distal aspects. IMPRESSION: MRI HEAD IMPRESSION: 1. No acute intracranial abnormality. 2. Mild to moderate chronic microvascular ischemic disease. MRA HEAD IMPRESSION: 1. Negative intracranial MRA for large vessel occlusion. No high-grade or correctable stenosis. Minimal atherosclerotic changes for patient age. 2. 5 x 5 mm supraclinoid left ICA aneurysm. 3. 3 x 2 mm supraclinoid right ICA aneurysm. Electronically Signed   By: Jeannine Boga M.D.   On: 10/24/2017 05:29   Mr Monica Curry Head Wo Contrast  Result Date: 10/24/2017 CLINICAL DATA:  Initial evaluation for acute onset expressive aphasia, now resolved. EXAM: MRI HEAD WITHOUT CONTRAST MRA HEAD WITHOUT CONTRAST TECHNIQUE: Multiplanar, multiecho pulse sequences of the brain and surrounding structures were obtained without intravenous contrast. Angiographic images of the head were obtained using MRA technique without contrast. COMPARISON:  Comparison made with prior CT from 10/23/2017. FINDINGS: MRI HEAD FINDINGS Brain: Generalized age-related cerebral atrophy. Patchy and confluent T2/FLAIR hyperintensity within the periventricular, deep, and subcortical white matter both cerebral hemispheres, most likely related chronic small vessel ischemic disease, mild to moderate nature. No abnormal foci of restricted diffusion to suggest acute or subacute ischemia. Gray-white matter differentiation maintained. No  encephalomalacia to suggest chronic infarction. No evidence for acute or chronic intracranial hemorrhage. Susceptibility artifact along the tentorium consistent with scattered calcifications as seen on prior CT. No mass lesion, midline shift or mass effect. No hydrocephalus. No extra-axial fluid collection. Major dural sinuses grossly patent. Pituitary gland suprasellar region normal. Midline structures intact and normal. Vascular: Major intracranial vascular flow voids maintained. Skull and upper cervical spine: Craniocervical junction normal. Upper cervical spine within normal limits. Bone marrow signal intensity normal. No scalp soft tissue abnormality. Sinuses/Orbits: Globes and orbital soft tissues within normal limits. Paranasal sinuses clear. No mastoid effusion. Inner ear structures normal. Other:  None. MRA HEAD FINDINGS ANTERIOR CIRCULATION: Distal cervical segments of the internal carotid arteries are patent with antegrade flow. Petrous, cavernous, and supraclinoid segments patent without flow-limiting stenosis. Saccular aneurysm arising from the supraclinoid left ICA measures 5 x 5 mm (series 4, image 108). This is directed laterally and slightly anteriorly. Second 3 x 2 mm aneurysm arises from the supraclinoid right ICA (series 4, image 114). ICA termini patent. A1 segments normal. Anterior communicating artery within normal limits. Anterior cerebral artery is patent to their distal aspects without stenosis. M1 segments patent without stenosis. Normal MCA bifurcations. Distal MCA branches well perfused and symmetric. POSTERIOR CIRCULATION: Vertebral arteries patent to the vertebrobasilar junction without stenosis. Right vertebral artery dominant. Right PICA patent. Left PICA not visualized. Basilar artery widely patent to its distal aspect. Dominant left AICA. Superior cerebral arteries patent bilaterally. Both of the posterior cerebral artery supplied via the basilar and are well perfused to their  distal aspects. IMPRESSION: MRI HEAD IMPRESSION: 1. No acute intracranial abnormality. 2. Mild to moderate chronic microvascular ischemic disease. MRA HEAD IMPRESSION: 1. Negative intracranial MRA for  large vessel occlusion. No high-grade or correctable stenosis. Minimal atherosclerotic changes for patient age. 2. 5 x 5 mm supraclinoid left ICA aneurysm. 3. 3 x 2 mm supraclinoid right ICA aneurysm. Electronically Signed   By: Jeannine Boga M.D.   On: 10/24/2017 05:29   Ct Head Code Stroke Wo Contrast  Result Date: 10/23/2017 CLINICAL DATA:  Code stroke. Acute onset slurred speech at 1730 hours. RIGHT facial droop. History of hypertension, hyperlipidemia, tremor and cancer. EXAM: CT HEAD WITHOUT CONTRAST TECHNIQUE: Contiguous axial images were obtained from the base of the skull through the vertex without intravenous contrast. COMPARISON:  None. FINDINGS: BRAIN: No intraparenchymal hemorrhage, mass effect nor midline shift. The ventricles and sulci are normal for age. Patchy supratentorial white matter hypodensities less than expected for patient's age, though non-specific are most compatible with chronic small vessel ischemic disease. No acute large vascular territory infarcts. No abnormal extra-axial fluid collections. Basal cisterns are patent. VASCULAR: Mild calcific atherosclerosis of the carotid siphons. SKULL: No skull fracture. Moderate RIGHT temporomandibular osteoarthrosis. No significant scalp soft tissue swelling. SINUSES/ORBITS: The mastoid air-cells and included paranasal sinuses are well-aerated.The included ocular globes and orbital contents are non-suspicious. OTHER: None. ASPECTS [facility] Stroke Program Early CT Score) - Ganglionic level infarction (caudate, lentiform nuclei, internal capsule, insula, M1-M3 cortex): 7 - Supraganglionic infarction (M4-M6 cortex): 3 Total score (0-10 with 10 being normal): 10 IMPRESSION: 1. Normal noncontrast CT HEAD for age. 2. ASPECTS is 10. 3. Critical  Value/emergent results were called by telephone at the time of interpretation on 10/23/2017 at 6:35 pm to Dr. Varney Biles , who verbally acknowledged these results. Electronically Signed   By: Elon Alas M.D.   On: 10/23/2017 18:36   2D Echo - PENDING B/L Carotid U/S - 1-39% ICA Stenosis B/L LE Duplex - negative for DVT  PHYSICAL EXAM Temp:  [97.5 F (36.4 C)-98.1 F (36.7 C)] 97.9 F (36.6 C) (11/16 1115) Pulse Rate:  [57-89] 79 (11/16 1115) Resp:  [16-24] 16 (11/16 1018) BP: (118-213)/(58-99) 137/72 (11/16 1115) SpO2:  [96 %-100 %] 98 % (11/16 1115) Weight:  [70.9 kg (156 lb 4.9 oz)] 70.9 kg (156 lb 4.9 oz) (11/15 2129)  General - Well nourished, well developed, in no apparent distress Respiratory - Lungs clear bilaterally. No wheezing. Cardiovascular - Regular rate and rhythm   NEURO:  Mental Status: AA&Ox3  Language: speech is clear.  Naming, repetition, fluency, and comprehension intact. Cranial Nerves: PERRL. EOMI, visual fields full, no facial asymmetry,facial sensation intact, hearing intact, tongue/uvula/soft palate midline, normal sternocleidomastoid and trapezius muscle strength. No evidence of tongue atrophy or fibrillations -she does have some perioral fine tremor-attributes it to her essential tremor. Motor: 5/5 all over Tone: is normal and bulk is normal Sensation- Intact to light touch bilaterally Coordination: FTN shows action and intention tremor bilaterally, no dysmetria., no ataxia in BLE. Gait- deferred  ASSESSMENT AND PLAN: Monica Curry is a 83 y.o. female with PMH of HTN, essential tremor, Hx of PE in 1997 no longer on Coumadin since approximately 2000, admitted for acute onset of 3-10 episode of Aphasia and slurred speech.   TIA: Likely secondary to Hypertensive urgency vs cardio-embolic  Resultant Symptoms - All admission symptoms have resolved  MRI/ MRA Head - No LVO or significant stenosis. Mild/Mod microvasular ischemic disease.  Incidental findings: 5 x 5 mm supraclinoid left ICA aneurysm. 3 x 2 mm supraclinoid right ICA aneurysm   CT Head - No acute findings  B/L Carotid U/S - 1-39% ICA  Stenosis B/L LE Duplex - negative for DVT  2D Echo - PENDING        Recommendations   LDL - 105  HgbA1c 5.5  VTE prophylaxis - SCD's  Diet - Diet Heart Room service appropriate? Yes; Fluid consistency: Thin   CVA Meds-   aspirin 81 mg daily prior to admission, now on aspirin 325 mg daily.   Please discharge patient on  aspirin 325 mg daily.   Patient counseled to be compliant with her antithrombotic medications  Ongoing aggressive stroke risk factor management  Therapy recommendations:  HOME  Disposition: HOME        Follow up Appointments:    PCP follow up   Follow up with [facility], Dr Leonie Man in 6 weeks  R/O AFIB, CHRONIC: Outpatient TEE and Loop Recorder - Cardiology office to call patient to arrange  HYPERTENSION: Stable Permissive hypertension (OK if <220/120) for 24-48 hours post stroke and then gradually normalized within 5-7 days.  Long term BP goal normotensive.   May slowly restart home B/P medications after 48 hours with close PCP follow up.  HYPERLIPIDEMIA:  Home meds:  Lipitor Crestor   NONE  LDL  105  , goal < 70  Started on   Lipitor to 80 mg daily  Continue statin at discharge  R/O DIABETES:  HgbA1c   5.5    goal < 7.0  Controlled  Other Stroke Risk Factors:  Advanced age  Hx of PE - 1997 on Coumadin x 2 yrs, then ASA 325 mg , then 81mg   Essential Tremor - continue home dose of propranolol and home dose of Klonopin-Klonopin regimen is 0.5 mg every morning, 0.25 mg q. afternoon, 0.25 mg nightly  Hospital day # 0   Monica Curry Stroke Neurology Team 10/24/2017 2:24 PM I have personally examined this patient, reviewed notes, independently viewed imaging studies, participated in medical decision making and plan of care.ROS completed by  me personally and pertinent positives fully documented  I have made any additions or clarifications directly to the above note. Agree with note above. . She presented with transient expressive speech difficulties and slurred speech likely due to left hemispheric TIA etiology unclear small vessel disease versus cardiogenic embolism from paroxysmal A. fib.  Recommend increase aspirin to 325 mg daily and outpatient TEE and loop recorder testing.  Follow-up with her outpatient neurologist.  Discussed with Dr. Ree Kida.  Greater than 50% time during this 25-minute visit was spent on counseling and coordination of care about TIA and stroke prevention. Antony Contras, MD Medical Director [facility] Pager: [phone] 10/24/2017 5:52 PM  Neurology to sign-off at this time. Please call with any further questions or concerns. Thank you for this consultation.  To contact Stroke Continuity provider, please refer to [email or web address] After hours, contact General Neurology

## 2017-10-24 NOTE — Progress Notes (Signed)
Pt dc'd home. Instructions given, patient verbalized understanding. Belongings sent home with patient. Discharged via wheelchair to main entrance.

## 2017-10-24 NOTE — Care Management Obs Status (Signed)
[place] NOTIFICATION   Patient Details  Name: Waldine Zenz MRN: [id number] Date of Birth: 1931/03/01   Medicare Observation Status Notification Given:  Yes    Pollie Friar, RN 10/24/2017, 3:35 PM

## 2017-10-24 NOTE — Evaluation (Signed)
Physical Therapy Evaluation and Discharge Patient Details Name: Monica Curry MRN: [id number] DOB: Apr 22, 1931 Today's Date: 10/24/2017   History of Present Illness   83 y.o. female with medical history significant of HTN, PE many years ago postoperatively comes in with sudden onset of expressive aphasia while with her step daughter.  They came immediatedly to the [place] long ED as it was closest to them.  By the time of arrival her aphasia had resolved.  She is now back to normal. MRI revealed No acute intracranial abnormality. PMH for endometrial cancer, essential tremor currently on propranolol and Klonopin, postoperative DVT and PE in 1994  Clinical Impression  Patient evaluated by Physical Therapy with no further acute PT needs identified. All education has been completed and the patient has no further questions. Pt is currently at her baseline level of function which is at least modified independent with all mobility. Pt does no require any equipment or PT follow-up. PT is signing off. Thank you for this referral.     Follow Up Recommendations No PT follow up    Equipment Recommendations  None recommended by PT    Recommendations for Other Services       Precautions / Restrictions Precautions Precautions: None Restrictions Weight Bearing Restrictions: No      Mobility  Bed Mobility Overal bed mobility: Modified Independent                Transfers Overall transfer level: Needs assistance Equipment used: None Transfers: Sit to/from Stand Sit to Stand: Modified independent (Device/Increase time)         General transfer comment: strong power up and steadying in standing  Ambulation/Gait Ambulation/Gait assistance: Independent Ambulation Distance (Feet): 200 Feet Assistive device: None Gait Pattern/deviations: Step-through pattern;WFL(Within Functional Limits) Gait velocity: WFL Gait velocity interpretation: >2.62 ft/sec, indicative of independent community  ambulator General Gait Details: strong, steady gait  Stairs Stairs: Yes Stairs assistance: Modified independent (Device/Increase time) Stair Management: Two rails;One rail Left;One rail Right Number of Stairs: 20 General stair comments: safe, steady ascent/descent of stairs utilizing two rails or one  Wheelchair Mobility    Modified Rankin (Stroke Patients Only)       Balance Overall balance assessment: No apparent balance deficits (not formally assessed)                                           Pertinent Vitals/Pain Pain Assessment: No/denies pain    Home Living Family/patient expects to be discharged to:: Private residence Living Arrangements: Alone Available Help at Discharge: Family;Available PRN/intermittently Type of Home: House Home Access: Stairs to enter Entrance Stairs-Rails: Right;Left Entrance Stairs-Number of Steps: 3 Home Layout: Two level;Laundry or work area in [place]: [name] - single point;Bedside commode      Prior Function Level of Independence: Independent               Hand Dominance   Dominant Hand: Right    Extremity/Trunk Assessment   Upper Extremity Assessment Upper Extremity Assessment: Defer to OT evaluation    Lower Extremity Assessment Lower Extremity Assessment: Overall WFL for tasks assessed    Cervical / Trunk Assessment Cervical / Trunk Assessment: Normal  Communication   Communication: No difficulties  Cognition Arousal/Alertness: Awake/alert Behavior During Therapy: WFL for tasks assessed/performed Overall Cognitive Status: Within Functional Limits for tasks assessed  Assessment/Plan    PT Assessment Patent does not need any further PT services         PT Goals (Current goals can be found in the Care Plan section)  Acute Rehab PT Goals Patient Stated Goal: return home     AM-PAC PT "6 Clicks" Daily  Activity  Outcome Measure Difficulty turning over in bed (including adjusting bedclothes, sheets and blankets)?: None Difficulty moving from lying on back to sitting on the side of the bed? : None Difficulty sitting down on and standing up from a chair with arms (e.g., wheelchair, bedside commode, etc,.)?: None Help needed moving to and from a bed to chair (including a wheelchair)?: None Help needed walking in hospital room?: None Help needed climbing 3-5 steps with a railing? : None 6 Click Score: 24    End of Session Equipment Utilized During Treatment: Gait belt Activity Tolerance: Patient tolerated treatment well Patient left: in bed;with call bell/phone within reach;with bed alarm set Nurse Communication: Mobility status PT Visit Diagnosis: Other symptoms and signs involving the nervous system (R29.898)    Time: 4562-5638 PT Time Calculation (min) (ACUTE ONLY): 15 min   Charges:   PT Evaluation $PT Eval Moderate Complexity: 1 Mod     PT G Codes:   PT G-Codes **NOT FOR INPATIENT CLASS** Functional Assessment Tool Used: AM-PAC 6 Clicks Basic Mobility Functional Limitation: Mobility: Walking and moving around Mobility: Walking and Moving Around Current Status (L3734): 100 percent impaired, limited or restricted Mobility: Walking and Moving Around Goal Status (K8768): 100 percent impaired, limited or restricted Mobility: Walking and Moving Around Discharge Status (T1572): 100 percent impaired, limited or restricted    Benjamine Mola B. Migdalia Dk PT, DPT Acute Rehabilitation  [phone] Pager [phone]    [place] 10/24/2017, 12:24 PM

## 2017-10-24 NOTE — Evaluation (Signed)
Occupational Therapy Evaluation and Discharge  Patient Details Name: Monica Curry MRN: [id number] DOB: 1931/01/20 Today's Date: 10/24/2017    History of Present Illness  83 y.o. female with medical history significant of HTN, PE many years ago postoperatively comes in with sudden onset of expressive aphasia while with her step daughter.  They came immediatedly to the [facility] ED as it was closest to them.  By the time of arrival her aphasia had resolved.  She is now back to normal. MRI revealed No acute intracranial abnormality. PMH for endometrial cancer, essential tremor currently on propranolol and Klonopin, postoperative DVT and PE in 1994   Clinical Impression   Pt reports she was independent with ADL PTA. Currently pt overall supervision for ADL and functional mobility; quickly approaching mod I. Pt reports deficits have resolved and she currently feels close to or at her functional baseline. Pt planning to d/c home alone. No further acute OT needs identified; signing off at this time. Please re-consult if needs change. Thank you for this referral.    Follow Up Recommendations  No OT follow up    Equipment Recommendations  None recommended by OT    Recommendations for Other Services       Precautions / Restrictions Precautions Precautions: None Restrictions Weight Bearing Restrictions: No      Mobility Bed Mobility Overal bed mobility: Modified Independent                Transfers Overall transfer level: Needs assistance Equipment used: None Transfers: Sit to/from Stand Sit to Stand: Supervision         General transfer comment: for safety initially, no physical assist. No unsteadiness or LOB    Balance Overall balance assessment: No apparent balance deficits (not formally assessed)                                         ADL either performed or assessed with clinical judgement   ADL Overall ADL's : Needs  assistance/impaired Eating/Feeding: Independent;Sitting   Grooming: Supervision/safety;Oral care;Brushing hair;Standing   Upper Body Bathing: Set up;Sitting   Lower Body Bathing: Supervison/ safety;Sit to/from stand   Upper Body Dressing : Set up;Sitting   Lower Body Dressing: Supervision/safety;Sit to/from stand   Toilet Transfer: Supervision/safety;Ambulation;Regular Toilet       Tub/ Banker: Supervision/safety;Tub transfer;Walk-in shower;Ambulation   Functional mobility during ADLs: Supervision/safety       Vision Baseline Vision/History: Wears glasses Wears Glasses: Reading only Patient Visual Report: No change from baseline(visual deficits have resolved per pt) Vision Assessment?: No apparent visual deficits     Perception     Praxis      Pertinent Vitals/Pain Pain Assessment: No/denies pain     Hand Dominance Right   Extremity/Trunk Assessment Upper Extremity Assessment Upper Extremity Assessment: Overall WFL for tasks assessed   Lower Extremity Assessment Lower Extremity Assessment: Defer to PT evaluation   Cervical / Trunk Assessment Cervical / Trunk Assessment: Normal   Communication Communication Communication: No difficulties   Cognition Arousal/Alertness: Awake/alert Behavior During Therapy: WFL for tasks assessed/performed Overall Cognitive Status: Within Functional Limits for tasks assessed                                     General Comments       Exercises  Shoulder Instructions      Home Living Family/patient expects to be discharged to:: Private residence Living Arrangements: Alone Available Help at Discharge: Family;Available PRN/intermittently Type of Home: House Home Access: Stairs to enter [employer] of Steps: 3 Entrance Stairs-Rails: Right;Left Home Layout: Two level;Laundry or work area in basement Alternate Therapist, sports of Steps: flight Alternate Level Stairs-Rails:  Right;Left Bathroom Shower/Tub: Gaffer;Tub/shower unit   Bathroom Toilet: Standard     Home Equipment: Cane - single point;Bedside commode          Prior Functioning/Environment Level of Independence: Independent                 OT Problem List:        OT Treatment/Interventions:      OT Goals(Current goals can be found in the care plan section) Acute Rehab OT Goals Patient Stated Goal: return home OT Goal Formulation: All assessment and education complete, DC therapy  OT Frequency:     Barriers to D/C:            Co-evaluation              AM-PAC PT "6 Clicks" Daily Activity     Outcome Measure Help from another person eating meals?: None Help from another person taking care of personal grooming?: A Little Help from another person toileting, which includes using toliet, bedpan, or urinal?: A Little Help from another person bathing (including washing, rinsing, drying)?: A Little Help from another person to put on and taking off regular upper body clothing?: None Help from another person to put on and taking off regular lower body clothing?: A Little 6 Click Score: 20   End of Session Nurse Communication: Other (comment)(pt in bathroom--told her to pull call light when finished)  Activity Tolerance: Patient tolerated treatment well Patient left: with call bell/phone within reach;Other (comment)(in bathroom)  OT Visit Diagnosis: Cognitive communication deficit (R41.841)                Time: 9417-4081 OT Time Calculation (min): 17 min Charges:  OT General Charges $OT Visit: 1 Visit OT Evaluation $OT Eval Low Complexity: 1 Low G-Codes: OT G-codes **NOT FOR INPATIENT CLASS** Functional Assessment Tool Used: Clinical judgement Functional Limitation: Self care Self Care Current Status (K4818): At least 1 percent but less than 20 percent impaired, limited or restricted Self Care Goal Status (H6314): At least 1 percent but less than 20 percent  impaired, limited or restricted Self Care Discharge Status [phone]): At least 1 percent but less than 20 percent impaired, limited or restricted   Mel Almond A. Ulice Brilliant, M.S., OTR/L Pager: [place] 10/24/2017, 11:20 AM

## 2017-10-24 NOTE — Care Management Note (Signed)
Case Management Note  Patient Details  Name: Monica Curry MRN: [id number] Date of Birth: 10-17-1931  Subjective/Objective:                    Action/Plan: Pt discharging home with self care. No f/u and no DME per PT/OT. Pt has PCP, insurance and transportation home. No further needs per CM.  Expected Discharge Date:  10/24/17               Expected Discharge Plan:  Home/Self Care  In-House Referral:     Discharge planning Services     Post Acute Care Choice:    Choice offered to:     DME Arranged:    DME Agency:     HH Arranged:    HH Agency:     Status of Service:  Completed, signed off  If discussed at [employer] of Stay Meetings, dates discussed:    Additional Comments:  Pollie Friar, RN 10/24/2017, 3:36 PM

## 2017-10-24 NOTE — Progress Notes (Signed)
Prelim:  Carotid duplex: 1-39% ICA Stenosis. LE venous duplex: negative for DVT. Landry Mellow, RDMS, RVT

## 2017-10-24 NOTE — Discharge Summary (Signed)
Physician Discharge Summary  Monica Curry DDU:[id number] DOB: Sep 11, 1931 DOA: 10/23/2017  PCP: Monica Pinto, MD  Admit date: 10/23/2017 Discharge date: 10/24/2017  Time spent: 45 minutes  Recommendations for Outpatient Follow-up:  Patient will be discharged to home.  Patient will need to follow up with primary care provider within one week of discharge.  Follow up with Monica. Leonie Curry, neurology, in 6 weeks. Patient should continue medications as prescribed.  Patient should follow a heart healty diet.   Discharge Diagnoses:  TIA History of PE Essential hypertension Essential tremor Chronic kidney disease, Stage III  Discharge Condition: Stable  Diet recommendation: heart healthy  Filed Weights   10/23/17 2129  Weight: 70.9 kg (156 lb 4.9 oz)    History of present illness:  On 10/23/2017 by Monica. Steward Curry Monica Curry is a 83 y.o. female with medical history significant of HTN, PE many years ago postoperatively comes in with sudden onset of expressive aphasia while with her step daughter.  They came immediatedly to the [facility] ED as it was closest to them.  By the time of arrival her aphasia had resolved.  She is now back to normal.  She denies any numbness, tingling, or weakness with it.  She denies any facial drooping or facial abnormalities with it although facial droop is noted by ED staff.  She has not h/o tia/stroke in the past.  She takes a baby aspirin a day.  Pt referred for admission for likely TIA.  Hospital Course:  TIA -Presented with expressive aphasia however resolved upon arrival to the hospital -MRI brain showed no acute cranial normality -MRA head did show negative intracranial MRI for large vessel occlusion. No high-grade or correctable stenosis. 5 x 5 mm subclinical white left ICA aneurysm, 3 x 2 mm glenoid right ICA aneurysm -Echocardiogram EF 23-76%, grade 1 diastolic dysfunction -Carotid Doppler 1-39% ICA stenosis  -PT OT recommended no further  therapy needs -LDL 105, hemoglobin A1c 5.5 -Neurology consulted and appreciated. Feels this could be a left hemispheric TIA. He recommended outpatient TEE and loop recorder which neurology will set up. -Continue aspirin and statin   History of PE -patient is concerned that she may have a blood clot as one was missed back in the 1990s -Obtained DDimer- 0.34 (negative) -patient completely asymptomatic -Lower ext doppler negative   Essential hypertension -Continue propanolol  Essential tremor -continue propanolol   Chronic kidney disease, Stage III -Appears to be stable  Procedures: Echocardiogram Carotid doppler  Consultations: Neurology   Discharge Exam: Vitals:   10/24/17 1115 10/24/17 1439  BP: 137/72 (!) 120/53  Pulse: 79 66  Resp:  16  Temp: 97.9 F (36.6 C) 98 F (36.7 C)  SpO2: 98% 97%   Patient feeling better. Denies any further issues with her speech. Denies current chest pain, shortness of breath, abdominal pain, nausea, vomiting, diarrhea, constipation, dizziness, headache.   General: Well developed, well nourished, NAD, appears stated age  HEENT: NCAT, mucous membranes moist.  Cardiovascular: S1 S2 auscultated, no rubs, murmurs or gallops. Regular rate and rhythm.  Respiratory: Clear to auscultation bilaterally with equal chest rise  Abdomen: Soft, nontender, nondistended, + bowel sounds  Extremities: warm dry without cyanosis clubbing or edema  Neuro: AAOx3, nonfocal  Psych: Appropriate mood and affect  Discharge Instructions Discharge Instructions    Ambulatory referral to Neurology   Complete by:  As directed    An appointment is requested in approximately: 6 weeks Follow up with stroke clinic (Monica Curry preferred, if not  available, then consider Monica Curry, Monica Curry or Monica Curry whoever is available) at [facility] in about 6-8 weeks. Thanks.   Discharge instructions   Complete by:  As directed    Patient will be discharged to home.  Patient will need  to follow up with primary care provider within one week of discharge.  Follow up with Monica. Leonie Curry, neurology, in 6 weeks. Patient should continue medications as prescribed.  Patient should follow a heart healty diet.   Discharge instructions   Complete by:  As directed    Patient will be discharged to home.  Patient will need to follow up with primary care provider within one week of discharge.  Follow up with Monica. Leonie Curry, neurology, in 6 weeks. Patient should continue medications as prescribed.  Patient should follow a heart healty diet.     Current Discharge Medication List    START taking these medications   Details  atorvastatin (LIPITOR) 10 MG tablet Take 1 tablet (10 mg total) daily at 6 PM by mouth. Qty: 30 tablet, Refills: 0      CONTINUE these medications which have CHANGED   Details  aspirin 325 MG tablet Take 1 tablet (325 mg total) daily by mouth. Qty: 30 tablet, Refills: 0      CONTINUE these medications which have NOT CHANGED   Details  Calcium-Magnesium-Vitamin D 300-20-200 MG-MG-UNIT CHEW Chew 1 tablet daily by mouth.     Cholecalciferol (VITAMIN D3) 5000 units TABS Take 1 tablet daily by mouth.    clonazePAM (KLONOPIN) 0.5 MG tablet Take 1 pill in AM, 1/2 pill at noon and 1/2 pill at night. Qty: 60 tablet, Refills: 5   Associated Diagnoses: Essential tremor    propranolol (INDERAL) 20 MG tablet Take 1 tablet (20 mg total) by mouth 3 (three) times daily. Qty: 90 tablet, Refills: 5   Associated Diagnoses: Essential tremor      STOP taking these medications     Lactobacillus (PROBIOTIC ACIDOPHILUS) TABS        Allergies  Allergen Reactions  . Codeine Hives  . Meloxicam Nausea Only    Gastritis and nausea  . Mysoline [Primidone]   . Pneumovax 23 [Pneumococcal Vac Polyvalent]     Red, raised area.  Knot at site of injection  . Toradol [Ketorolac Tromethamine] Swelling    Localized redness at injection site.     Follow-up Information    Monica Pinto,  MD. Schedule an appointment as soon as possible for a visit in 1 week(s).   Specialty:  Internal Medicine Why:  Hospital follow up Contact information: [address] [place] [address] [phone]        Garvin Fila, MD. Schedule an appointment as soon as possible for a visit in 6 week(s).   Specialties:  Neurology, Radiology Why:  Hospital follow up Contact information: [address] [place] [address] [phone]            The results of significant diagnostics from this hospitalization (including imaging, microbiology, ancillary and laboratory) are listed below for reference.    Significant Diagnostic Studies: Mr Brain 66 Contrast  Result Date: 10/24/2017 CLINICAL DATA:  Initial evaluation for acute onset expressive aphasia, now resolved. EXAM: MRI HEAD WITHOUT CONTRAST MRA HEAD WITHOUT CONTRAST TECHNIQUE: Multiplanar, multiecho pulse sequences of the brain and surrounding structures were obtained without intravenous contrast. Angiographic images of the head were obtained using MRA technique without contrast. COMPARISON:  Comparison made with prior CT from 10/23/2017. FINDINGS: MRI HEAD FINDINGS  Brain: Generalized age-related cerebral atrophy. Patchy and confluent T2/FLAIR hyperintensity within the periventricular, deep, and subcortical white matter both cerebral hemispheres, most likely related chronic small vessel ischemic disease, mild to moderate nature. No abnormal foci of restricted diffusion to suggest acute or subacute ischemia. Gray-white matter differentiation maintained. No encephalomalacia to suggest chronic infarction. No evidence for acute or chronic intracranial hemorrhage. Susceptibility artifact along the tentorium consistent with scattered calcifications as seen on prior CT. No mass lesion, midline shift or mass effect. No hydrocephalus. No extra-axial fluid collection. Major dural sinuses grossly patent. Pituitary gland  suprasellar region normal. Midline structures intact and normal. Vascular: Major intracranial vascular flow voids maintained. Skull and upper cervical spine: Craniocervical junction normal. Upper cervical spine within normal limits. Bone marrow signal intensity normal. No scalp soft tissue abnormality. Sinuses/Orbits: Globes and orbital soft tissues within normal limits. Paranasal sinuses clear. No mastoid effusion. Inner ear structures normal. Other:  None. MRA HEAD FINDINGS ANTERIOR CIRCULATION: Distal cervical segments of the internal carotid arteries are patent with antegrade flow. Petrous, cavernous, and supraclinoid segments patent without flow-limiting stenosis. Saccular aneurysm arising from the supraclinoid left ICA measures 5 x 5 mm (series 4, image 108). This is directed laterally and slightly anteriorly. Second 3 x 2 mm aneurysm arises from the supraclinoid right ICA (series 4, image 114). ICA termini patent. A1 segments normal. Anterior communicating artery within normal limits. Anterior cerebral artery is patent to their distal aspects without stenosis. M1 segments patent without stenosis. Normal MCA bifurcations. Distal MCA branches well perfused and symmetric. POSTERIOR CIRCULATION: Vertebral arteries patent to the vertebrobasilar junction without stenosis. Right vertebral artery dominant. Right PICA patent. Left PICA not visualized. Basilar artery widely patent to its distal aspect. Dominant left AICA. Superior cerebral arteries patent bilaterally. Both of the posterior cerebral artery supplied via the basilar and are well perfused to their distal aspects. IMPRESSION: MRI HEAD IMPRESSION: 1. No acute intracranial abnormality. 2. Mild to moderate chronic microvascular ischemic disease. MRA HEAD IMPRESSION: 1. Negative intracranial MRA for large vessel occlusion. No high-grade or correctable stenosis. Minimal atherosclerotic changes for patient age. 2. 5 x 5 mm supraclinoid left ICA aneurysm. 3. 3 x 2  mm supraclinoid right ICA aneurysm. Electronically Signed   By: Jeannine Boga M.D.   On: 10/24/2017 05:29   Mr [name] Head Wo Contrast  Result Date: 10/24/2017 CLINICAL DATA:  Initial evaluation for acute onset expressive aphasia, now resolved. EXAM: MRI HEAD WITHOUT CONTRAST MRA HEAD WITHOUT CONTRAST TECHNIQUE: Multiplanar, multiecho pulse sequences of the brain and surrounding structures were obtained without intravenous contrast. Angiographic images of the head were obtained using MRA technique without contrast. COMPARISON:  Comparison made with prior CT from 10/23/2017. FINDINGS: MRI HEAD FINDINGS Brain: Generalized age-related cerebral atrophy. Patchy and confluent T2/FLAIR hyperintensity within the periventricular, deep, and subcortical white matter both cerebral hemispheres, most likely related chronic small vessel ischemic disease, mild to moderate nature. No abnormal foci of restricted diffusion to suggest acute or subacute ischemia. Gray-white matter differentiation maintained. No encephalomalacia to suggest chronic infarction. No evidence for acute or chronic intracranial hemorrhage. Susceptibility artifact along the tentorium consistent with scattered calcifications as seen on prior CT. No mass lesion, midline shift or mass effect. No hydrocephalus. No extra-axial fluid collection. Major dural sinuses grossly patent. Pituitary gland suprasellar region normal. Midline structures intact and normal. Vascular: Major intracranial vascular flow voids maintained. Skull and upper cervical spine: Craniocervical junction normal. Upper cervical spine within normal limits. Bone marrow signal intensity normal. No scalp  soft tissue abnormality. Sinuses/Orbits: Globes and orbital soft tissues within normal limits. Paranasal sinuses clear. No mastoid effusion. Inner ear structures normal. Other:  None. MRA HEAD FINDINGS ANTERIOR CIRCULATION: Distal cervical segments of the internal carotid arteries are patent  with antegrade flow. Petrous, cavernous, and supraclinoid segments patent without flow-limiting stenosis. Saccular aneurysm arising from the supraclinoid left ICA measures 5 x 5 mm (series 4, image 108). This is directed laterally and slightly anteriorly. Second 3 x 2 mm aneurysm arises from the supraclinoid right ICA (series 4, image 114). ICA termini patent. A1 segments normal. Anterior communicating artery within normal limits. Anterior cerebral artery is patent to their distal aspects without stenosis. M1 segments patent without stenosis. Normal MCA bifurcations. Distal MCA branches well perfused and symmetric. POSTERIOR CIRCULATION: Vertebral arteries patent to the vertebrobasilar junction without stenosis. Right vertebral artery dominant. Right PICA patent. Left PICA not visualized. Basilar artery widely patent to its distal aspect. Dominant left AICA. Superior cerebral arteries patent bilaterally. Both of the posterior cerebral artery supplied via the basilar and are well perfused to their distal aspects. IMPRESSION: MRI HEAD IMPRESSION: 1. No acute intracranial abnormality. 2. Mild to moderate chronic microvascular ischemic disease. MRA HEAD IMPRESSION: 1. Negative intracranial MRA for large vessel occlusion. No high-grade or correctable stenosis. Minimal atherosclerotic changes for patient age. 2. 5 x 5 mm supraclinoid left ICA aneurysm. 3. 3 x 2 mm supraclinoid right ICA aneurysm. Electronically Signed   By: Jeannine Boga M.D.   On: 10/24/2017 05:29   Ct Head Code Stroke Wo Contrast  Result Date: 10/23/2017 CLINICAL DATA:  Code stroke. Acute onset slurred speech at 1730 hours. RIGHT facial droop. History of hypertension, hyperlipidemia, tremor and cancer. EXAM: CT HEAD WITHOUT CONTRAST TECHNIQUE: Contiguous axial images were obtained from the base of the skull through the vertex without intravenous contrast. COMPARISON:  None. FINDINGS: BRAIN: No intraparenchymal hemorrhage, mass effect nor  midline shift. The ventricles and sulci are normal for age. Patchy supratentorial white matter hypodensities less than expected for patient's age, though non-specific are most compatible with chronic small vessel ischemic disease. No acute large vascular territory infarcts. No abnormal extra-axial fluid collections. Basal cisterns are patent. VASCULAR: Mild calcific atherosclerosis of the carotid siphons. SKULL: No skull fracture. Moderate RIGHT temporomandibular osteoarthrosis. No significant scalp soft tissue swelling. SINUSES/ORBITS: The mastoid air-cells and included paranasal sinuses are well-aerated.The included ocular globes and orbital contents are non-suspicious. OTHER: None. ASPECTS [facility] Stroke Program Early CT Score) - Ganglionic level infarction (caudate, lentiform nuclei, internal capsule, insula, M1-M3 cortex): 7 - Supraganglionic infarction (M4-M6 cortex): 3 Total score (0-10 with 10 being normal): 10 IMPRESSION: 1. Normal noncontrast CT HEAD for age. 2. ASPECTS is 10. 3. Critical Value/emergent results were called by telephone at the time of interpretation on 10/23/2017 at 6:35 pm to Monica. Varney Biles , who verbally acknowledged these results. Electronically Signed   By: Elon Alas M.D.   On: 10/23/2017 18:36    Microbiology: No results found for this or any previous visit (from the past 240 hour(s)).   Labs: Basic Metabolic Panel: Recent Labs  Lab 10/23/17 1807 10/23/17 1824  NA 138 140  K 4.3 4.3  CL 105 105  CO2 25  --   GLUCOSE 106* 105*  BUN 23* 25*  CREATININE 1.14* 1.10*  CALCIUM 9.5  --    Liver Function Tests: Recent Labs  Lab 10/23/17 1807  AST 21  ALT 14  ALKPHOS 37*  BILITOT 0.6  PROT 7.4  ALBUMIN  4.4   No results for input(s): LIPASE, AMYLASE in the last 168 hours. No results for input(s): AMMONIA in the last 168 hours. CBC: Recent Labs  Lab 10/23/17 1807 10/23/17 1824  WBC 5.8  --   NEUTROABS 2.2  --   HGB 14.6 15.3*  HCT 44.0 45.0    MCV 89.1  --   PLT 255  --    Cardiac Enzymes: No results for input(s): CKTOTAL, CKMB, CKMBINDEX, TROPONINI in the last 168 hours. BNP: BNP (last 3 results) No results for input(s): BNP in the last 8760 hours.  ProBNP (last 3 results) No results for input(s): PROBNP in the last 8760 hours.  CBG: Recent Labs  Lab 10/23/17 1810  GLUCAP 101*       Signed:  Sophiana Milanese  [facility] 10/24/2017, 3:14 PM

## 2017-10-24 NOTE — Progress Notes (Signed)
  Echocardiogram 2D Echocardiogram has been performed.  Brittane Grudzinski L Androw 10/24/2017, 2:24 PM

## 2017-11-03 ENCOUNTER — Ambulatory Visit: Payer: Medicare Other | Admitting: Gynecology

## 2017-11-03 ENCOUNTER — Encounter: Payer: Self-pay | Admitting: Gynecology

## 2017-11-03 ENCOUNTER — Other Ambulatory Visit: Payer: Self-pay | Admitting: Gynecology

## 2017-11-03 VITALS — BP 120/70 | Ht 64.0 in | Wt 148.0 lb

## 2017-11-03 DIAGNOSIS — C541 Malignant neoplasm of endometrium: Secondary | ICD-10-CM | POA: Diagnosis not present

## 2017-11-03 DIAGNOSIS — N952 Postmenopausal atrophic vaginitis: Secondary | ICD-10-CM

## 2017-11-03 DIAGNOSIS — Z1272 Encounter for screening for malignant neoplasm of vagina: Secondary | ICD-10-CM | POA: Diagnosis not present

## 2017-11-03 DIAGNOSIS — M810 Age-related osteoporosis without current pathological fracture: Secondary | ICD-10-CM

## 2017-11-03 DIAGNOSIS — Z01411 Encounter for gynecological examination (general) (routine) with abnormal findings: Secondary | ICD-10-CM

## 2017-11-03 NOTE — Addendum Note (Signed)
Addended by: Nelva Nay on: 11/03/2017 11:20 AM   Modules accepted: Orders

## 2017-11-03 NOTE — Patient Instructions (Signed)
Office will contact you in follow-up after your bone density.  Follow-up in 1 year for annual exam.

## 2017-11-03 NOTE — Progress Notes (Signed)
    Monica Curry [id number]        83 y.o.  G2P2001 for annual gynecologic exam.   Past medical history,surgical history, problem list, medications, allergies, family history and social history were all reviewed and documented as reviewed in the EPIC chart.  ROS:  Performed with pertinent positives and negatives included in the history, assessment and plan.   Additional significant findings : None   Exam: [name] assistant Vitals:   11/03/17 1029  BP: 120/70  Weight: 148 lb (67.1 kg)  Height: 5\' 4"  (1.626 m)   Body mass index is 25.4 kg/m.  General appearance:  Normal affect, orientation and appearance. Skin: Grossly normal HEENT: Without gross lesions.  No cervical or supraclavicular adenopathy. Thyroid normal.  Lungs:  Clear without wheezing, rales or rhonchi Cardiac: RR, without RMG Abdominal:  Soft, nontender, without masses, guarding, rebound, organomegaly or hernia Breasts:  Examined lying and sitting without masses, retractions, discharge or axillary adenopathy. Pelvic:  Ext, BUS, Vagina: With atrophic changes.  Pap smear of cuff done.  Adnexa: Without masses or tenderness    Anus and perineum: Normal   Rectovaginal: Normal sphincter tone without palpated masses or tenderness.    Assessment/Plan:  83 y.o. G34P2001 female for annual gynecologic exam.   1. History of endometrial adenocarcinoma, well differentiated.  Primarily in situ with focus of minimal myometrial invasion (2 mm) 1997.  Status post TVH/BSO.  Exam NED.  Pap smear of vaginal cuff done. 2. Mammography scheduled this December.  Breast exam normal today. 3. Osteoporosis.  DEXA 2016 T score -2.8 stable from prior DEXA.  History of bisphosphonates for over 10 years.  On drug-free holiday.  Has DEXA scheduled next month and will follow-up for this. 4. Colonoscopy 2012.  Repeat at their recommended interval. 5. Health maintenance.  No routine lab work done as patient does this elsewhere.   Follow-up for DEXA.  Follow-up in 1 year, sooner as needed.   Anastasio Auerbach MD, 10:59 AM 11/03/2017

## 2017-11-06 LAB — PAP IG W/ RFLX HPV ASCU

## 2017-11-06 NOTE — Progress Notes (Signed)
Dictation #1 [id number]  [id number]   [place]     This very nice 83 y.o.  WWF presents for follow up for transition from recent hospitalization. Admit date to the hospital was 11.15.2018 and patient was discharged from the hospital on 11.16.2018 and the day after discharge  our clinical staff contacted the patient to assure stability and schedule a follow up appointment. The discharge summary, medications and diagnostic test results were reviewed before meeting with the patient. The patient was admitted for a L Brain TIA w/ transient expressive aphasia. MRI, MRA, 2D echo, Carotid Dopplers, were Normal. Patient was released on LD bASA. Patient also has HLD, Essential tremor, PreDM  and Vit D deficiency.     Hospitalization discharge instructions and medications are reconciled with the patient.      Patient is also followed with Hypertension, Hyperlipidemia, Pre-Diabetes and Vitamin D Deficiency.      Patient is treated for HTN & BP has been controlled at home. Today's BP is at goal - 114/72. Patient has had no complaints of any cardiac type chest pain, palpitations, dyspnea/orthopnea/PND, dizziness, claudication, or dependent edema.     Hyperlipidemia is controlled with diet & meds. Patient denies myalgias or other med SE's. Last Lipids were near goal: Lab Results  Component Value Date   CHOL 166 10/24/2017   HDL 37 (L) 10/24/2017   LDLCALC 105 (H) 10/24/2017   TRIG 122 10/24/2017   CHOLHDL 4.5 10/24/2017      Also, the patient has history of PreDiabetes (A1c 5.7%/2016) and has had no symptoms of reactive hypoglycemia, diabetic polys, paresthesias or visual blurring.  Last A1c was at goal: Lab Results  Component Value Date   HGBA1C 5.4 11/07/2017      Further, the patient also has history of Vitamin D Deficiency and supplements vitamin D without any suspected side-effects. Last vitamin D was at goal:  Lab Results  Component Value Date   VD25OH 71 11/07/2017    Outpatient Medications Prior to Visit  Medication Sig  . atorvastatin  10 MG  Take 1 tablet (10 mg total) daily at 6 PM by mouth.  Marland Kitchen Ca-Mag-Vit D 300-20-200  Chew 1 tablet daily by mouth.   Marland Kitchen VIT D 5000 u Take 1 tablet daily by mouth.  . clonazePAM  0.5 MG  Take 1 pill in AM, 1/2 pill at noon and 1/2 pill at night.  . propranolol20 MG  Take 1 tablet (20 mg total) by mouth 3 (three) times daily.  Marland Kitchen aspirin 81 MG  Take 1 tablet (325 mg total) daily by mouth.   Allergies  Allergen Reactions  . Codeine Hives  . Meloxicam Nausea Only    Gastritis and nausea  . Mysoline [Primidone]   . Pneumovax 23 [Pneumococcal Vac Polyvalent]     Red, raised area.  Knot at site of injection  . Toradol [Ketorolac Tromethamine] Swelling    Localized redness at injection site.     PMHx:   Past Medical History:  Diagnosis Date  . C. difficile colitis   . Cancer [facility])    Endometrial cancer  . Essential and other specified forms of tremor   . Osteoporosis 11/2015   T score -2.8 stable from prior DEXA  . Personal history of venous thrombosis and embolism 1997  . Thrush   . TIA (transient ischemic attack)    Immunization History  Administered Date(s) Administered  . H1N1 01/03/2009  . Influenza Whole 09/23/2013  . Influenza, High Dose Seasonal PF  09/06/2014, 08/23/2015, 08/21/2016, 08/28/2017  . PPD Test 03/29/2014  . Pneumococcal Polysaccharide-23 03/29/2014  . Pneumococcal-Unspecified 12/09/2002  . Td 03/23/2013  . Zoster 12/09/2006   Past Surgical History:  Procedure Laterality Date  . ABDOMINAL HYSTERECTOMY  1997  . APPENDECTOMY    . [facility]  . OOPHORECTOMY     BSO  . TONSILLECTOMY AND ADENOIDECTOMY     FHx:    Reviewed / unchanged  SHx:    Reviewed / unchanged  Systems Review:  Constitutional: Denies fever, chills, wt changes, headaches, insomnia, fatigue, night sweats, change in appetite. Eyes: Denies redness, blurred vision, diplopia, discharge, itchy, watery  eyes.  ENT: Denies discharge, congestion, post nasal drip, epistaxis, sore throat, earache, hearing loss, dental pain, tinnitus, vertigo, sinus pain, snoring.  CV: Denies chest pain, palpitations, irregular heartbeat, syncope, dyspnea, diaphoresis, orthopnea, PND, claudication or edema. Respiratory: denies cough, dyspnea, DOE, pleurisy, hoarseness, laryngitis, wheezing.  Gastrointestinal: Denies dysphagia, odynophagia, heartburn, reflux, water brash, abdominal pain or cramps, nausea, vomiting, bloating, diarrhea, constipation, hematemesis, melena, hematochezia  or hemorrhoids. Genitourinary: Denies dysuria, frequency, urgency, nocturia, hesitancy, discharge, hematuria or flank pain. Musculoskeletal: Denies arthralgias, myalgias, stiffness, jt. swelling, pain, limping or strain/sprain.  Skin: Denies pruritus, rash, hives, warts, acne, eczema or change in skin lesion(s). Neuro: No weakness, tremor, incoordination, spasms, paresthesia or pain. Psychiatric: Denies confusion, memory loss or sensory loss. Endo: Denies change in weight, skin or hair change.  Heme/Lymph: No excessive bleeding, bruising or enlarged lymph nodes.  Physical Exam  BP 114/72   Pulse 64   Temp (!) 97.5 F (36.4 C)   Resp 16   Ht 5\' 4"  (1.626 m)   Wt 146 lb 12.8 oz (66.6 kg)   BMI 25.20 kg/m   Appears well nourished, well groomed  and in no distress.  Eyes: PERRLA, EOMs, conjunctiva no swelling or erythema. Sinuses: No frontal/maxillary tenderness ENT/Mouth: EAC's clear, TM's nl w/o erythema, bulging. Nares clear w/o erythema, swelling, exudates. Oropharynx clear without erythema or exudates. Oral hygiene is good. Tongue normal, non obstructing. Hearing intact.  Neck: Supple. Thyroid nl. Car 2+/2+ without bruits, nodes or JVD. Chest: Respirations nl with BS clear & equal w/o rales, rhonchi, wheezing or stridor.  Cor: Heart sounds normal w/ regular rate and rhythm without sig. murmurs, gallops, clicks or rubs.  Peripheral pulses normal and equal  without edema.  Abdomen: Soft & bowel sounds normal. Non-tender w/o guarding, rebound, hernias, masses or organomegaly.  Lymphatics: Unremarkable.  Musculoskeletal: Full ROM all peripheral extremities, joint stability, 5/5 strength and normal gait.  Skin: Warm, dry without exposed rashes, lesions or ecchymosis apparent.  Neuro: Cranial nerves intact, reflexes equal bilaterally. Sensory-motor testing grossly intact. Tendon reflexes grossly intact.  Pysch: Alert & oriented x 3.  Insight and judgement nl & appropriate. No ideations.  Assessment and Plan:  1. TIA (transient ischemic attack)  - continue LD bASA 81 mg daily   2. Essential hypertension  - Continue medication, monitor blood pressure at home.  - Continue DASH diet. Reminder to go to the ER if any CP,  SOB, nausea, dizziness, severe HA, changes vision/speech.  - CBC with Differential/Platelet - BASIC METABOLIC PANEL WITH GFR - Magnesium - TSH  3. Mixed hyperlipidemia  - Continue diet/meds, exercise,& lifestyle modifications.  - Continue monitor periodic cholesterol/liver & renal functions  - TSH  4. Prediabetes  - Continue diet, exercise, lifestyle modifications.  - Monitor appropriate labs.  - Hemoglobin A1c - Insulin, random  5. Vitamin D deficiency  -  Continue supplementation.  - VITAMIN D 25 Hydroxy  6. Essential tremor  7. Medication management  - CBC with Differential/Platelet - BASIC METABOLIC PANEL WITH GFR - Magnesium - TSH - Hemoglobin A1c - Insulin, random - VITAMIN D 25 Hydroxy       Discussed  regular exercise, BP monitoring, weight control to achieve/maintain BMI less than 25 and discussed med and SE's. Recommended labs to assess and monitor clinical status with further disposition pending results of labs. Over 30 minutes of exam, counseling, chart review was performed.

## 2017-11-06 NOTE — Patient Instructions (Signed)
Transient Ischemic Attack A transient ischemic attack (TIA) is a "warning stroke" that causes stroke-like symptoms. Unlike a stroke, a TIA does not cause permanent damage to the brain. The symptoms of a TIA can happen very fast and do not last long. It is important to know the symptoms of a TIA and what to do. This can help prevent a major stroke or death. What are the causes? A TIA is caused by a temporary blockage in an artery in the brain or neck (carotid artery). The blockage does not allow the brain to get the blood supply it needs and can cause different symptoms. The blockage can be caused by either:  A blood clot.  Fatty buildup (plaque) in a neck or brain artery.  What increases the risk?  High blood pressure (hypertension).  High cholesterol.  Diabetes mellitus.  Heart disease.  The buildup of plaque in the blood vessels (peripheral artery disease or atherosclerosis).  The buildup of plaque in the blood vessels that provide blood and oxygen to the brain (carotid artery stenosis).  An abnormal heart rhythm (atrial fibrillation).  Obesity.  Using any tobacco products, including cigarettes, chewing tobacco, or electronic cigarettes.  Taking oral contraceptives, especially in combination with using tobacco.  Physical inactivity.  A diet high in fats, salt (sodium), and calories.  Excessive alcohol use.  Being over the age of 61 years.  Family history of stroke.  Previous history of blood clots, stroke, TIA, or heart attack.   What are the signs or symptoms? TIA symptoms are the same as a stroke but are temporary. These symptoms usually develop suddenly, or may be newly present upon waking from sleep:  Sudden weakness or numbness of the face, arm, or leg, especially on one side of the body.  Sudden trouble walking or difficulty moving arms or legs.  Sudden confusion.  Sudden personality changes.  Trouble speaking (aphasia) or understanding.  Difficulty  swallowing.  Sudden trouble seeing in one or both eyes.  Double vision.  Dizziness.  Loss of balance or coordination.  Sudden severe headache with no known cause.  Trouble reading or writing.  Loss of bowel or bladder control.  Loss of consciousness.  How is this diagnosed? Your health care provider may be able to determine the presence or absence of a TIA based on your symptoms, history, and physical exam. CT scan of the brain is usually performed to help identify a TIA. Other tests may include:  Electrocardiography (ECG).  Continuous heart monitoring.  Echocardiography.  Carotid ultrasonography.  MRI.  A scan of the brain circulation.  Blood tests.  How is this treated? Since the symptoms of TIA are the same as a stroke, it is important to seek treatment as soon as possible. You may need a medicine to dissolve a blood clot (thrombolytic) if that is the cause of the TIA. This medicine cannot be given if too much time has passed. Treatment may also include:  Rest, oxygen, fluids through an IV tube, and medicines to thin the blood (anticoagulants).  Measures will be taken to prevent short-term and long-term complications, including infection from breathing foreign material into the lungs (aspiration pneumonia), blood clots in the legs, and falls.  Procedures to either remove plaque in the carotid arteries or dilate carotid arteries that have narrowed due to plaque. Those procedures are: ? Carotid endarterectomy. ? Carotid angioplasty and stenting.  Medicines and diet may be used to address diabetes, high blood pressure, and other underlying risk factors.  Follow  these instructions at home:  Take medicines only as directed by your health care provider. Follow the directions carefully. Medicines may be used to control risk factors for a stroke. Be sure you understand all your medicine instructions.  You may be told to take aspirin or the anticoagulant warfarin.  Warfarin needs to be taken exactly as instructed. ? Taking too much or too little warfarin is dangerous. Too much warfarin increases the risk of bleeding. Too little warfarin continues to allow the risk for blood clots. While taking warfarin, you will need to have regular blood tests to measure your blood clotting time. A PT blood test measures how long it takes for blood to clot. Your PT is used to calculate another value called an INR. Your PT and INR help your health care provider to adjust your dose of warfarin. The dose can change for many reasons. It is critically important that you take warfarin exactly as prescribed. ? Many foods, especially foods high in vitamin K can interfere with warfarin and affect the PT and INR. Foods high in vitamin K include spinach, kale, broccoli, cabbage, collard and turnip greens, Brussels sprouts, peas, cauliflower, seaweed, and parsley, as well as beef and pork liver, green tea, and soybean oil. You should eat a consistent amount of foods high in vitamin K. Avoid major changes in your diet, or notify your health care provider before changing your diet. Arrange a visit with a dietitian to answer your questions. ? Many medicines can interfere with warfarin and affect the PT and INR. You must tell your health care provider about any and all medicines you take; this includes all vitamins and supplements. Be especially cautious with aspirin and anti-inflammatory medicines. Do not take or discontinue any prescribed or over-the-counter medicine except on the advice of your health care provider or pharmacist. ? Warfarin can have side effects, such as excessive bruising or bleeding. You will need to hold pressure over cuts for longer than usual. Your health care provider or pharmacist will discuss other potential side effects. ? Avoid sports or activities that may cause injury or bleeding. ? Be careful when shaving, flossing your teeth, or handling sharp objects. ? Alcohol can  change the body's ability to handle warfarin. It is best to avoid alcoholic drinks or consume only very small amounts while taking warfarin. Notify your health care provider if you change your alcohol intake. ? Notify your dentist or other health care providers before procedures.  Eat a diet that includes 5 or more servings of fruits and vegetables each day. This may reduce the risk of stroke. Certain diets may be prescribed to address high blood pressure, high cholesterol, diabetes, or obesity. ? A diet low in sodium, saturated fat, trans fat, and cholesterol is recommended to manage high blood pressure. ? A diet low in saturated fat, trans fat, and cholesterol, and high in fiber may control cholesterol levels. ? A controlled-carbohydrate, controlled-sugar diet is recommended to manage diabetes. ? A reduced-calorie diet that is low in sodium, saturated fat, trans fat, and cholesterol is recommended to manage obesity.  Maintain a healthy weight.  Stay physically active. It is recommended that you get at least 30 minutes of activity on most or all days.  Do not use any tobacco products, including cigarettes, chewing tobacco, or electronic cigarettes. If you need help quitting, ask your health care provider.  Limit alcohol intake to no more than 1 drink per day for nonpregnant women and 2 drinks per day for men.  One drink equals 12 ounces of beer, 5 ounces of wine, or 1 ounces of hard liquor.  Do not abuse drugs.  A safe home environment is important to reduce the risk of falls. Your health care provider may arrange for specialists to evaluate your home. Having grab bars in the bedroom and bathroom is often important. Your health care provider may arrange for equipment to be used at home, such as raised toilets and a seat for the shower.  Follow all instructions for follow-up with your health care provider. This is very important. This includes any referrals and lab tests. Proper follow-up can  prevent a stroke or another TIA from occurring. How is this prevented? The risk of a TIA can be decreased by appropriately treating high blood pressure, high cholesterol, diabetes, heart disease, and obesity, and by quitting smoking, limiting alcohol, and staying physically active. Contact a health care provider if:  You have personality changes.  You have difficulty swallowing.  You are seeing double.  You have dizziness.  You have a fever. Get help right away if: Any of the following symptoms may represent a serious problem that is an emergency. Do not wait to see if the symptoms will go away. Get medical help right away. Call your local emergency services (911 in [place] Do not drive yourself to the hospital.  You have sudden weakness or numbness of the face, arm, or leg, especially on one side of the body.  You have sudden trouble walking or difficulty moving arms or legs.  You have sudden confusion.  You have trouble speaking (aphasia) or understanding.  You have sudden trouble seeing in one or both eyes.  You have a loss of balance or coordination.  You have a sudden, severe headache with no known cause.  You have new chest pain or an irregular heartbeat.  You have a partial or total loss of consciousness.  This information is not intended to replace advice given to you by your health care provider. Make sure you discuss any questions you have with your health care provider. Document Released: 09/04/2005 Document Revised: 07/29/2016 Document Reviewed: 03/02/2014 [employer] Interactive Patient Education  2017 [employer]  ++++++++++++++++++++++++++ Recommend Adult Low Dose Aspirin or  coated  Aspirin 81 mg daily  To reduce risk of Colon Cancer 20 %,  Skin Cancer 26 % ,  Melanoma 46%  and  Pancreatic cancer 60% +++++++++++++++++++++++++ Vitamin D goal  is between 70-100.  Please make sure that you are taking your Vitamin D as directed.  It is very important as a  natural anti-inflammatory  helping hair, skin, and nails, as well as reducing stroke and heart attack risk.  It helps your bones and helps with mood. It also decreases numerous cancer risks so please take it as directed.  Low Vit D is associated with a 200-300% higher risk for CANCER  and 200-300% higher risk for HEART   ATTACK  &  STROKE.   .....................................Marland Kitchen It is also associated with higher death rate at younger ages,  autoimmune diseases like Rheumatoid arthritis, Lupus, Multiple Sclerosis.    Also many other serious conditions, like depression, Alzheimer's Dementia, infertility, muscle aches, fatigue, fibromyalgia - just to name a few. ++++++++++++++++++++ Recommend the book "The END of DIETING" by Dr Excell Seltzer  & the book "The END of DIABETES " by Dr Excell Seltzer At [email or web address] - get book & Audio CD's    Being diabetic has a  300% increased risk for heart attack, stroke,  cancer, and alzheimer- type vascular dementia. It is very important that you work harder with diet by avoiding all foods that are white. Avoid white rice (brown & wild rice is OK), white potatoes (sweetpotatoes in moderation is OK), White bread or wheat bread or anything made out of white flour like bagels, donuts, rolls, buns, biscuits, cakes, pastries, cookies, pizza crust, and pasta (made from white flour & egg whites) - vegetarian pasta or spinach or wheat pasta is OK. Multigrain breads like Arnold's or Pepperidge [employer], or multigrain sandwich thins or flatbreads.  Diet, exercise and weight loss can reverse and cure diabetes in the early stages.  Diet, exercise and weight loss is very important in the control and prevention of complications of diabetes which affects every system in your body, ie. Brain - dementia/stroke, eyes - glaucoma/blindness, heart - heart attack/heart failure, kidneys - dialysis, stomach - gastric paralysis, intestines - malabsorption, nerves - severe painful neuritis, circulation  - gangrene & loss of a leg(s), and finally cancer and Alzheimers.    I recommend avoid fried & greasy foods,  sweets/candy, white rice (brown or wild rice or Quinoa is OK), white potatoes (sweet potatoes are OK) - anything made from white flour - bagels, doughnuts, rolls, buns, biscuits,white and wheat breads, pizza crust and traditional pasta made of white flour & egg white(vegetarian pasta or spinach or wheat pasta is OK).  Multi-grain bread is OK - like multi-grain flat bread or sandwich thins. Avoid alcohol in excess. Exercise is also important.    Eat all the vegetables you want - avoid meat, especially red meat and dairy - especially cheese.  Cheese is the most concentrated form of trans-fats which is the worst thing to clog up our arteries. Veggie cheese is OK which can be found in the fresh produce section at [employer] or [employer] or [employer]  +++++++++++++++++++++ DASH Eating Plan  DASH stands for "Dietary Approaches to Stop Hypertension."   The DASH eating plan is a healthy eating plan that has been shown to reduce high blood pressure (hypertension). Additional health benefits may include reducing the risk of type 2 diabetes mellitus, heart disease, and stroke. The DASH eating plan may also help with weight loss. WHAT DO I NEED TO KNOW ABOUT THE DASH EATING PLAN? For the DASH eating plan, you will follow these general guidelines:  Choose foods with a percent daily value for sodium of less than 5% (as listed on the food label).  Use salt-free seasonings or herbs instead of table salt or sea salt.  Check with your health care provider or pharmacist before using salt substitutes.  Eat lower-sodium products, often labeled as "lower sodium" or "no salt added."  Eat fresh foods.  Eat more vegetables, fruits, and low-fat dairy products.  Choose whole grains. Look for the word "whole" as the first word in the ingredient list.  Choose fish   Limit sweets, desserts, sugars, and  sugary drinks.  Choose heart-healthy fats.  Eat veggie cheese   Eat more home-cooked food and less restaurant, buffet, and fast food.  Limit fried foods.  Cook foods using methods other than frying.  Limit canned vegetables. If you do use them, rinse them well to decrease the sodium.  When eating at a restaurant, ask that your food be prepared with less salt, or no salt if possible.                      WHAT FOODS CAN I EAT? Read  Dr Fara Olden Fuhrman's books on The End of Dieting & The End of Diabetes  Grains Whole grain or whole wheat bread. Brown rice. Whole grain or whole wheat pasta. Quinoa, bulgur, and whole grain cereals. Low-sodium cereals. Corn or whole wheat flour tortillas. Whole grain cornbread. Whole grain crackers. Low-sodium crackers.  Vegetables Fresh or frozen vegetables (raw, steamed, roasted, or grilled). Low-sodium or reduced-sodium tomato and vegetable juices. Low-sodium or reduced-sodium tomato sauce and paste. Low-sodium or reduced-sodium canned vegetables.   Fruits All fresh, canned (in natural juice), or frozen fruits.  Protein Products  All fish and seafood.  Dried beans, peas, or lentils. Unsalted nuts and seeds. Unsalted canned beans.  Dairy Low-fat dairy products, such as skim or 1% milk, 2% or reduced-fat cheeses, low-fat ricotta or cottage cheese, or plain low-fat yogurt. Low-sodium or reduced-sodium cheeses.  Fats and Oils Tub margarines without trans fats. Light or reduced-fat mayonnaise and salad dressings (reduced sodium). Avocado. Safflower, olive, or canola oils. Natural peanut or almond butter.  Other Unsalted popcorn and pretzels. The items listed above may not be a complete list of recommended foods or beverages. Contact your dietitian for more options.  +++++++++++++++  WHAT FOODS ARE NOT RECOMMENDED? Grains/ White flour or wheat flour White bread. White pasta. White rice. Refined cornbread. Bagels and croissants. Crackers that contain  trans fat.  Vegetables  Creamed or fried vegetables. Vegetables in a . Regular canned vegetables. Regular canned tomato sauce and paste. Regular tomato and vegetable juices.  Fruits Dried fruits. Canned fruit in light or heavy syrup. Fruit juice.  Meat and Other Protein Products Meat in general - RED meat & White meat.  Fatty cuts of meat. Ribs, chicken wings, all processed meats as bacon, sausage, bologna, salami, fatback, hot dogs, bratwurst and packaged luncheon meats.  Dairy Whole or 2% milk, cream, half-and-half, and cream cheese. Whole-fat or sweetened yogurt. Full-fat cheeses or blue cheese. Non-dairy creamers and whipped toppings. Processed cheese, cheese spreads, or cheese curds.  Condiments Onion and garlic salt, seasoned salt, table salt, and sea salt. Canned and packaged gravies. Worcestershire sauce. Tartar sauce. Barbecue sauce. Teriyaki sauce. Soy sauce, including reduced sodium. Steak sauce. Fish sauce. Oyster sauce. Cocktail sauce. Horseradish. Ketchup and mustard. Meat flavorings and tenderizers. Bouillon cubes. Hot sauce. Tabasco sauce. Marinades. Taco seasonings. Relishes.  Fats and Oils Butter, stick margarine, lard, shortening and bacon fat. Coconut, palm kernel, or palm oils. Regular salad dressings.  Pickles and olives. Salted popcorn and pretzels.  The items listed above may not be a complete list of foods and beverages to avoid.

## 2017-11-07 ENCOUNTER — Ambulatory Visit: Payer: Medicare Other | Admitting: Internal Medicine

## 2017-11-07 VITALS — BP 114/72 | HR 64 | Temp 97.5°F | Resp 16 | Ht 64.0 in | Wt 146.8 lb

## 2017-11-07 DIAGNOSIS — G459 Transient cerebral ischemic attack, unspecified: Secondary | ICD-10-CM | POA: Diagnosis not present

## 2017-11-07 DIAGNOSIS — E559 Vitamin D deficiency, unspecified: Secondary | ICD-10-CM | POA: Diagnosis not present

## 2017-11-07 DIAGNOSIS — R7303 Prediabetes: Secondary | ICD-10-CM | POA: Diagnosis not present

## 2017-11-07 DIAGNOSIS — I10 Essential (primary) hypertension: Secondary | ICD-10-CM | POA: Diagnosis not present

## 2017-11-07 DIAGNOSIS — G250 Essential tremor: Secondary | ICD-10-CM | POA: Diagnosis not present

## 2017-11-07 DIAGNOSIS — Z79899 Other long term (current) drug therapy: Secondary | ICD-10-CM | POA: Diagnosis not present

## 2017-11-07 DIAGNOSIS — E782 Mixed hyperlipidemia: Secondary | ICD-10-CM | POA: Diagnosis not present

## 2017-11-09 ENCOUNTER — Encounter: Payer: Self-pay | Admitting: Internal Medicine

## 2017-11-09 MED ORDER — ASPIRIN 81 MG PO TABS: ORAL_TABLET | ORAL | Status: DC

## 2017-11-09 MED ORDER — ASPIRIN EC 81 MG PO TBEC: 81.0000 mg | DELAYED_RELEASE_TABLET | Freq: Every day | ORAL | Status: DC

## 2017-11-10 LAB — BASIC METABOLIC PANEL WITH GFR
BUN / CREAT RATIO: 20 (calc) (ref 6–22)
BUN: 26 mg/dL — AB (ref 7–25)
CHLORIDE: 106 mmol/L (ref 98–110)
CO2: 27 mmol/L (ref 20–32)
Calcium: 9.5 mg/dL (ref 8.6–10.4)
Creat: 1.32 mg/dL — ABNORMAL HIGH (ref 0.60–0.88)
GFR, EST AFRICAN AMERICAN: 43 mL/min/{1.73_m2} — AB (ref >=60)
GFR, Est Non African American: 37 mL/min/{1.73_m2} — ABNORMAL LOW (ref >=60)
Glucose, Bld: 96 mg/dL (ref 65–99)
Potassium: 4 mmol/L (ref 3.5–5.3)
SODIUM: 141 mmol/L (ref 135–146)

## 2017-11-10 LAB — INSULIN, RANDOM: INSULIN: 17.3 u[IU]/mL (ref 2.0–19.6)

## 2017-11-10 LAB — CBC WITH DIFFERENTIAL/PLATELET
BASOS PCT: 1.6 %
Basophils Absolute: 70 cells/uL (ref 0–200)
EOS ABS: 132 {cells}/uL (ref 15–500)
EOS PCT: 3 %
HCT: 41.9 % (ref 35.0–45.0)
HEMOGLOBIN: 13.7 g/dL (ref 11.7–15.5)
Lymphs Abs: 2042 cells/uL (ref 850–3900)
MCH: 28.5 pg (ref 27.0–33.0)
MCHC: 32.7 g/dL (ref 32.0–36.0)
MCV: 87.1 fL (ref 80.0–100.0)
MONOS PCT: 9.4 %
MPV: 10.3 fL (ref 7.5–12.5)
NEUTROS ABS: 1742 {cells}/uL (ref 1500–7800)
Neutrophils Relative %: 39.6 %
PLATELETS: 269 10*3/uL (ref 140–400)
RBC: 4.81 10*6/uL (ref 3.80–5.10)
RDW: 13.3 % (ref 11.0–15.0)
TOTAL LYMPHOCYTE: 46.4 %
WBC mixed population: 414 cells/uL (ref 200–950)
WBC: 4.4 10*3/uL (ref 3.8–10.8)

## 2017-11-10 LAB — VITAMIN D 25 HYDROXY (VIT D DEFICIENCY, FRACTURES): Vit D, 25-Hydroxy: 71 ng/mL (ref 30–100)

## 2017-11-10 LAB — HEMOGLOBIN A1C
EAG (MMOL/L): 6 (calc)
Hgb A1c MFr Bld: 5.4 % of total Hgb (ref <5.7)
Mean Plasma Glucose: 108 (calc)

## 2017-11-10 LAB — MAGNESIUM: Magnesium: 1.8 mg/dL (ref 1.5–2.5)

## 2017-11-10 LAB — TSH: TSH: 2.12 m[IU]/L (ref 0.40–4.50)

## 2017-11-20 ENCOUNTER — Ambulatory Visit (INDEPENDENT_AMBULATORY_CARE_PROVIDER_SITE_OTHER): Payer: Medicare Other

## 2017-11-20 DIAGNOSIS — M810 Age-related osteoporosis without current pathological fracture: Secondary | ICD-10-CM

## 2017-11-24 ENCOUNTER — Encounter: Payer: Self-pay | Admitting: Gynecology

## 2017-11-24 ENCOUNTER — Telehealth: Payer: Self-pay | Admitting: Gynecology

## 2017-11-24 NOTE — Telephone Encounter (Signed)
Tell patient her most recent bone density is stable from her prior study.  It does continue to show osteoporosis.  Question is whether to restart an osteoporosis medication now or to continue to monitor.  She had been on the other medications for a long time and this may be of continuing benefit to her bones despite having stopped it before.  If she is interested in talking about treatment options then I recommend office visit, otherwise I would recommend repeating the bone density in 2 years.

## 2017-11-24 NOTE — Telephone Encounter (Signed)
Pt informed

## 2017-11-24 NOTE — Telephone Encounter (Signed)
Left message for pt to call.

## 2017-11-25 ENCOUNTER — Encounter: Payer: Self-pay | Admitting: Gynecology

## 2017-11-25 LAB — HM MAMMOGRAPHY

## 2017-12-21 NOTE — Progress Notes (Addendum)
MEDICARE ANNUAL WELLNESS VISIT AND FOLLOW UP  Assessment:   Diagnoses and all orders for this visit:  Medicare annual wellness visit, subsequent  Essential hypertension At goal; continue medication Monitor blood pressure at home; call if consistently over 130/80 Continue DASH diet.   Reminder to go to the ER if any CP, SOB, nausea, dizziness, severe HA, changes vision/speech, left arm numbness and tingling and jaw pain.  Sensation of chest tightness Ongoing very infrequently 10+ years; full cardiology workup negative in past, recent EKG/ECHO unremarkable 2 episodes in last month - no accompaniments or syncope; discussed cardiology likely low yield unless can catch episode on monitor or reproduce - she would like referral to discuss with cardiology, this was provided  History of TIA (transient ischemic attack) Continue ASA; follow up with neurology Control blood pressure, cholesterol, glucose, increase exercise.   Essential tremor  Continue follow up with neuro  Osteoporosis, unspecified osteoporosis type, unspecified pathological fracture presence Curently on drug break with hx of bisphosphonate tx; recent DEXA shows stable T score; continue follow up with Dr. Soundra Pilon Continue vitamin D supplementation, recommend weight bearing exercises  History of endometrial cancer S/p hysterectomy; continue follow up with Dr. Soundra Pilon  Urethral prolapse S/p hysterectomy; continue follow up with Dr. Soundra Pilon   HT CKD 3 (GFR 23ml/min) Increase fluids, avoid NSAIDS, monitor sugars, will monitor -     BASIC METABOLIC PANEL WITH GFR  Vitamin D deficiency At goal; Continue supplementation Defer vitamin D level  Hx/o DVT/PE Continue low dose ASA   Mixed hyperlipidemia Well controlled; continue statin Continue low cholesterol diet and exercise.  Defer lipid panel due to recent check   Other abnormal glucose Discussed disease and risks Discussed diet/exercise, weight management   Recent A1Cs well controlled; defer today. Check BMP  Medication management -     CBC with Differential/Platelet -     BASIC METABOLIC PANEL WITH GFR -     Hepatic function panel  Anxiety ? Anxiety related to ongoing/worsening chest tightness episodes She is already on benzo for tremors; Discussed trial of buspar today  Over 40 minutes of exam, counseling, chart review and critical decision making was performed Future Appointments  Date Time Provider [place]  01/06/2018  3:30 PM Garvin Fila, MD [facility] None  01/19/2018 10:30 AM [name], MD [facility] None  02/12/2018  3:30 PM Unk Pinto, MD [facility] None  05/18/2018 10:00 AM [name], NP [facility] None  11/04/2018 10:30 AM Fontaine, Belinda Block, MD [facility]     Plan:   During the course of the visit the patient was educated and counseled about appropriate screening and preventive services including:    Pneumococcal vaccine   Prevnar 13  Influenza vaccine  Td vaccine  Screening electrocardiogram  Bone densitometry screening  Colorectal cancer screening  Diabetes screening  Glaucoma screening  Nutrition counseling   Advanced directives: requested   Subjective:  Monica Curry is a 84 y.o. female who presents for Medicare Annual Wellness Visit and 6 week follow up for close monitoring due to recent TIA.   Admit date to the hospital was 11.15.2018 and patient was discharged from the hospital on11.16.2018. The patient was admitted for a L Brain TIA w/ transient expressive aphasia. MRI, MRA, 2D echo, Carotid Dopplers, were Normal. Patient was released on LD bASA. Patient also has HLD, Essential tremor, PreDM  and Vit D deficiency. She is followed by Neurology. She is also followed by Dr. Soundra Pilon (GYN) annually who manages PAPs, MMG, DEXA. She  is currently on drug break from bisphosphonate for osteoporosis.   She reports she has been experiencing episodes of chest tightness on left side  accompanied by a sense of heart stopping with "eyes becoming dark" - last less than a minute, and denies syncope- historically this has happened very infrequently (less than 2 episodes annually) ongoing for over 10 years. She has been referred to cardiology for full workup including 1 months long holter and stress tests which she reports have all been negative over 8 years ago. She reports she has had 2 such episodes in the past month since she had the TIA - she reports episodes are not associated with exertion, episodes of anxiety or stress, eating, or any other notable associations or patters. She denies accompaniments - nausea, vomiting, sense of impending doom, extremity or neck pain. She also has hx of PE; denies dyspnea or cough. O2 sats normal in office.    Her blood pressure has been controlled at home, today [name] is BP: 124/86 She does not workout- she works around the house and does yardwork.  She denies chest pain, shortness of breath, dizziness associated with exertion.    She is on cholesterol medication and denies myalgias. Her cholesterol is not at goal. The cholesterol last visit was:   Lab Results  Component Value Date   CHOL 166 10/24/2017   HDL 37 (L) 10/24/2017   LDLCALC 105 (H) 10/24/2017   TRIG 122 10/24/2017   CHOLHDL 4.5 10/24/2017    She has been working on diet and exercise for hx of prediabetes, and denies increased appetite, nausea, paresthesia of the feet, polydipsia, polyuria, visual disturbances and vomiting. Last A1C in the office was:  Lab Results  Component Value Date   HGBA1C 5.4 11/07/2017   Last GFR: Lab Results  Component Value Date   GFRNONAA 37 (L) 11/07/2017   Patient is on Vitamin D supplement and at goal at recent check:    Lab Results  Component Value Date   VD25OH 71 11/07/2017      Medication Review: Current Outpatient Medications on File Prior to Visit  Medication Sig Dispense Refill  . Calcium-Magnesium-Vitamin D 300-20-200  MG-MG-UNIT CHEW Chew 1 tablet daily by mouth.     . Cholecalciferol (VITAMIN D3) 5000 units TABS Take 1 tablet daily by mouth.    . clonazePAM (KLONOPIN) 0.5 MG tablet Take 1 pill in AM, 1/2 pill at noon and 1/2 pill at night. 60 tablet 5  . propranolol (INDERAL) 20 MG tablet Take 1 tablet (20 mg total) by mouth 3 (three) times daily. 90 tablet 5  . atorvastatin (LIPITOR) 10 MG tablet Take 1 tablet (10 mg total) daily at 6 PM by mouth. (Patient not taking: Reported on 12/22/2017) 30 tablet 0   No current facility-administered medications on file prior to visit.     Allergies  Allergen Reactions  . Codeine Hives  . Meloxicam Nausea Only    Gastritis and nausea  . Mysoline [Primidone]   . Pneumovax 23 [Pneumococcal Vac Polyvalent]     Red, raised area.  Knot at site of injection  . Toradol [Ketorolac Tromethamine] Swelling    Localized redness at injection site.      Current Problems (verified) Patient Active Problem List   Diagnosis Date Noted  . History of TIA (transient ischemic attack) 10/23/2017  .  HT CKD 3 (GFR 110ml/min) 07/21/2015  . Medicare annual wellness visit, subsequent 07/21/2015  . Medication management 07/04/2014  . Essential hypertension 11/25/2013  .  Mixed hyperlipidemia 11/25/2013  . Other abnormal glucose 11/25/2013  . Vitamin D deficiency 11/25/2013  . Essential tremor 11/25/2013  . History of endometrial cancer   . Hx/o DVT/PE   . Urethral prolapse   . Osteoporosis 10/10/2011    Screening Tests Immunization History  Administered Date(s) Administered  . H1N1 01/03/2009  . Influenza Whole 09/23/2013  . Influenza, High Dose Seasonal PF 09/06/2014, 08/23/2015, 08/21/2016, 08/28/2017  . PPD Test 03/29/2014  . Pneumococcal Polysaccharide-23 03/29/2014  . Pneumococcal-Unspecified 12/09/2002  . Td 03/23/2013  . Zoster 12/09/2006   Preventative care: Last colonoscopy: 2011 MGM 11/2017 DEXA 2016, 2018 - by Dr. Phineas Real, Osteoporosis, on break from  bisphosphonates PAP 10/2016 negative  Influenza 2018 TD 2014 Pneumonia 2015 prevnar 13 has allergy - D/C'd Zoster 2008  Names of Other Physician/Practitioners you currently use: 1. [facility] here for primary care 2. Dr Rutherford Guys , eye doctor, last visit 2016 3. Dr Ennis Forts, [place], dentist, last visit Feb 2016 & every 4 months  Patient Care Team: Unk Pinto, MD as PCP - General (Internal Medicine) Ronald Lobo, MD as Consulting Physician (Gastroenterology) Rutherford Guys, MD as Consulting Physician (Ophthalmology) Clance, Armando Reichert, MD as Consulting Physician (Pulmonary Disease) Fontaine, Belinda Block, MD as Consulting Physician (Gynecology) Love, Alyson Locket, MD as Consulting Physician (Neurology) [name], MD as Attending Physician (Neurology) Harriett Sine, MD as Consulting Physician (Dermatology)  SURGICAL HISTORY She  has a past surgical history that includes Cesarean section (1961); Abdominal hysterectomy (1997); Oophorectomy; Tonsillectomy and adenoidectomy; and Appendectomy. FAMILY HISTORY Her family history includes Cancer in her brother, brother, father, and mother; Diabetes in her sister; Heart disease in her brother, brother, sister, and sister; Kidney failure in her brother and sister. SOCIAL HISTORY She  reports that  has never smoked. she has never used smokeless tobacco. She reports that she drinks alcohol. She reports that she does not use drugs.   MEDICARE WELLNESS OBJECTIVES: Physical activity: Current Exercise Habits: The patient does not participate in regular exercise at present(Does yardwork/housework), Exercise limited by: None identified Cardiac risk factors: Cardiac Risk Factors include: dyslipidemia;advanced age (>8men, >30 women);hypertension;obesity (BMI >30kg/m2) Depression/mood screen:   Depression screen [facility] 2/9 12/22/2017  Decreased Interest 0  Down, Depressed, Hopeless 0  PHQ - 2 Score 0    ADLs:   In your present state of health, do you have any difficulty performing the following activities: 12/22/2017 11/09/2017  Hearing? N N  Vision? N N  Difficulty concentrating or making decisions? N N  Walking or climbing stairs? N N  Dressing or bathing? N N  Doing errands, shopping? N N  Some recent data might be hidden     Cognitive Testing  Alert? Yes  Normal Appearance?Yes  Oriented to person? Yes  Place? Yes   Time? Yes  Recall of three objects?  Yes  Can perform simple calculations? Yes  Displays appropriate judgment?Yes  Can read the correct time from a watch face?Yes  EOL planning: Does Patient Have a Medical Advance Directive?: Yes Type of Advance Directive: Healthcare Power of Attorney, Living will Does patient want to make changes to medical advance directive?: No - Patient declined Copy of [place] in Chart?: No - copy requested  Review of Systems  Constitutional: Negative for malaise/fatigue and weight loss.  HENT: Negative for hearing loss and tinnitus.   Eyes: Negative for blurred vision and double vision.  Respiratory: Negative for cough, shortness of breath and wheezing.   Cardiovascular:  Positive for chest pain (Extremely rare, intermittent, "tightness" ). Negative for palpitations, orthopnea, claudication and leg swelling.  Gastrointestinal: Negative for abdominal pain, blood in stool, constipation, diarrhea, heartburn, melena, nausea and vomiting.  Genitourinary: Negative.   Musculoskeletal: Negative for joint pain and myalgias.  Skin: Negative for rash.  Neurological: Negative for dizziness, tingling, sensory change, weakness and headaches.  Endo/Heme/Allergies: Negative for polydipsia.  Psychiatric/Behavioral: Negative.   All other systems reviewed and are negative.    Objective:     Today's Vitals   12/22/17 1102  BP: 124/86  Pulse: 73  Temp: 97.7 F (36.5 C)  SpO2: 99%  Weight: 148 lb (67.1 kg)  Height: 5\' 4"  (1.626 m)    Body mass index is 25.4 kg/m.  General appearance: alert, no distress, WD/WN, female HEENT: normocephalic, sclerae anicteric, TMs pearly, nares patent, no discharge or erythema, pharynx normal Oral cavity: MMM, no lesions Neck: supple, no lymphadenopathy, no thyromegaly, no masses Heart: RRR, normal S1, S2, no murmurs Lungs: CTA bilaterally, no wheezes, rhonchi, or rales Abdomen: +bs, soft, non tender, non distended, no masses, no hepatomegaly, no splenomegaly Musculoskeletal: nontender, no swelling, no obvious deformity Extremities: no edema, no cyanosis, no clubbing Pulses: 2+ symmetric, upper and lower extremities, normal cap refill Neurological: alert, oriented x 3, CN2-12 intact, strength normal upper extremities and lower extremities, sensation normal throughout, DTRs 2+ throughout, no cerebellar signs, gait normal Psychiatric: anxious affect, behavior normal, pleasant   Medicare Attestation I have personally reviewed: The patient's medical and social history Their use of alcohol, tobacco or illicit drugs Their current medications and supplements The patient's functional ability including ADLs,fall risks, home safety risks, cognitive, and hearing and visual impairment Diet and physical activities Evidence for depression or mood disorders  The patient's weight, height, BMI, and visual acuity have been recorded in the chart.  I have made referrals, counseling, and provided education to the patient based on review of the above and I have provided the patient with a written personalized care plan for preventive services.     Izora Ribas, NP   12/22/2017

## 2017-12-22 ENCOUNTER — Ambulatory Visit: Payer: Medicare Other | Admitting: Adult Health

## 2017-12-22 ENCOUNTER — Encounter: Payer: Self-pay | Admitting: Adult Health

## 2017-12-22 VITALS — BP 124/86 | HR 73 | Temp 97.7°F | Ht 64.0 in | Wt 148.0 lb

## 2017-12-22 DIAGNOSIS — N183 Chronic kidney disease, stage 3 unspecified: Secondary | ICD-10-CM

## 2017-12-22 DIAGNOSIS — Z79899 Other long term (current) drug therapy: Secondary | ICD-10-CM

## 2017-12-22 DIAGNOSIS — I10 Essential (primary) hypertension: Secondary | ICD-10-CM

## 2017-12-22 DIAGNOSIS — N368 Other specified disorders of urethra: Secondary | ICD-10-CM | POA: Diagnosis not present

## 2017-12-22 DIAGNOSIS — R0789 Other chest pain: Secondary | ICD-10-CM

## 2017-12-22 DIAGNOSIS — R7309 Other abnormal glucose: Secondary | ICD-10-CM

## 2017-12-22 DIAGNOSIS — Z8673 Personal history of transient ischemic attack (TIA), and cerebral infarction without residual deficits: Secondary | ICD-10-CM | POA: Diagnosis not present

## 2017-12-22 DIAGNOSIS — Z0001 Encounter for general adult medical examination with abnormal findings: Secondary | ICD-10-CM

## 2017-12-22 DIAGNOSIS — R6889 Other general symptoms and signs: Secondary | ICD-10-CM

## 2017-12-22 DIAGNOSIS — G459 Transient cerebral ischemic attack, unspecified: Secondary | ICD-10-CM

## 2017-12-22 DIAGNOSIS — M810 Age-related osteoporosis without current pathological fracture: Secondary | ICD-10-CM

## 2017-12-22 DIAGNOSIS — E559 Vitamin D deficiency, unspecified: Secondary | ICD-10-CM

## 2017-12-22 DIAGNOSIS — Z0000 Encounter for general adult medical examination without abnormal findings: Secondary | ICD-10-CM

## 2017-12-22 DIAGNOSIS — G250 Essential tremor: Secondary | ICD-10-CM | POA: Diagnosis not present

## 2017-12-22 DIAGNOSIS — Z8542 Personal history of malignant neoplasm of other parts of uterus: Secondary | ICD-10-CM

## 2017-12-22 DIAGNOSIS — E782 Mixed hyperlipidemia: Secondary | ICD-10-CM

## 2017-12-22 DIAGNOSIS — Z86718 Personal history of other venous thrombosis and embolism: Secondary | ICD-10-CM

## 2017-12-22 LAB — CBC WITH DIFFERENTIAL/PLATELET
BASOS PCT: 1 %
Basophils Absolute: 59 cells/uL (ref 0–200)
Eosinophils Absolute: 183 cells/uL (ref 15–500)
Eosinophils Relative: 3.1 %
HCT: 43.6 % (ref 35.0–45.0)
Hemoglobin: 14.7 g/dL (ref 11.7–15.5)
Lymphs Abs: 2596 cells/uL (ref 850–3900)
MCH: 29.2 pg (ref 27.0–33.0)
MCHC: 33.7 g/dL (ref 32.0–36.0)
MCV: 86.5 fL (ref 80.0–100.0)
MONOS PCT: 8.3 %
MPV: 10.4 fL (ref 7.5–12.5)
Neutro Abs: 2572 cells/uL (ref 1500–7800)
Neutrophils Relative %: 43.6 %
PLATELETS: 273 10*3/uL (ref 140–400)
RBC: 5.04 10*6/uL (ref 3.80–5.10)
RDW: 13.7 % (ref 11.0–15.0)
TOTAL LYMPHOCYTE: 44 %
WBC mixed population: 490 cells/uL (ref 200–950)
WBC: 5.9 10*3/uL (ref 3.8–10.8)

## 2017-12-22 LAB — HEPATIC FUNCTION PANEL
AG Ratio: 1.8 (calc) (ref 1.0–2.5)
ALT: 13 U/L (ref 6–29)
AST: 16 U/L (ref 10–35)
Albumin: 4.4 g/dL (ref 3.6–5.1)
Alkaline phosphatase (APISO): 36 U/L (ref 33–130)
BILIRUBIN INDIRECT: 0.4 mg/dL (ref 0.2–1.2)
Bilirubin, Direct: 0.1 mg/dL (ref 0.0–0.2)
GLOBULIN: 2.5 g/dL (ref 1.9–3.7)
TOTAL PROTEIN: 6.9 g/dL (ref 6.1–8.1)
Total Bilirubin: 0.5 mg/dL (ref 0.2–1.2)

## 2017-12-22 LAB — BASIC METABOLIC PANEL WITH GFR
BUN / CREAT RATIO: 17 (calc) (ref 6–22)
BUN: 20 mg/dL (ref 7–25)
CALCIUM: 10 mg/dL (ref 8.6–10.4)
CO2: 30 mmol/L (ref 20–32)
Chloride: 104 mmol/L (ref 98–110)
Creat: 1.17 mg/dL — ABNORMAL HIGH (ref 0.60–0.88)
GFR, Est African American: 50 mL/min/{1.73_m2} — ABNORMAL LOW (ref >=60)
GFR, Est Non African American: 43 mL/min/{1.73_m2} — ABNORMAL LOW (ref >=60)
Glucose, Bld: 90 mg/dL (ref 65–99)
POTASSIUM: 5.1 mmol/L (ref 3.5–5.3)
Sodium: 137 mmol/L (ref 135–146)

## 2017-12-22 MED ORDER — BUSPIRONE HCL 10 MG PO TABS: ORAL_TABLET | ORAL | 1 refills | Status: DC

## 2017-12-22 MED ORDER — ASPIRIN 325 MG PO TBEC: 325.0000 mg | DELAYED_RELEASE_TABLET | Freq: Every day | ORAL | Status: AC

## 2017-12-22 NOTE — Patient Instructions (Addendum)
I am sending in buspirone/buspar for possible anxiety contributing to chest pain episodes. Please start by taking 1/2 tab twice a day for a week, then can increase to three times daily - etc. Up to 1 full tab 3 times a day.    I am putting in for a referral for cardiology for you to discuss your chest pain episodes further. In the meantime, I would recommend you look into a medical alert device so alleviate some stressful thoughts related to possibly passing out.   Buspirone tablets What is this medicine? BUSPIRONE (byoo SPYE rone) is used to treat anxiety disorders. This medicine may be used for other purposes; ask your health care provider or pharmacist if you have questions. COMMON BRAND NAME(S): BuSpar What should I tell my health care provider before I take this medicine? They need to know if you have any of these conditions: -kidney or liver disease -an unusual or allergic reaction to buspirone, other medicines, foods, dyes, or preservatives -pregnant or trying to get pregnant -breast-feeding How should I use this medicine? Take this medicine by mouth with a glass of water. Follow the directions on the prescription label. You may take this medicine with or without food. To ensure that this medicine always works the same way for you, you should take it either always with or always without food. Take your doses at regular intervals. Do not take your medicine more often than directed. Do not stop taking except on the advice of your doctor or health care professional. Talk to your pediatrician regarding the use of this medicine in children. Special care may be needed. Overdosage: If you think you have taken too much of this medicine contact a poison control center or emergency room at once. NOTE: This medicine is only for you. Do not share this medicine with others. What if I miss a dose? If you miss a dose, take it as soon as you can. If it is almost time for your next dose, take only  that dose. Do not take double or extra doses. What may interact with this medicine? Do not take this medicine with any of the following medications: -linezolid -MAOIs like Carbex, Eldepryl, Marplan, Nardil, and Parnate -methylene blue -procarbazine This medicine may also interact with the following medications: -diazepam -digoxin -diltiazem -erythromycin -grapefruit juice -haloperidol -medicines for mental depression or mood problems -medicines for seizures like carbamazepine, phenobarbital and phenytoin -nefazodone -other medications for anxiety -rifampin -ritonavir -some antifungal medicines like itraconazole, ketoconazole, and voriconazole -verapamil -warfarin This list may not describe all possible interactions. Give your health care provider a list of all the medicines, herbs, non-prescription drugs, or dietary supplements you use. Also tell them if you smoke, drink alcohol, or use illegal drugs. Some items may interact with your medicine. What should I watch for while using this medicine? Visit your doctor or health care professional for regular checks on your progress. It may take 1 to 2 weeks before your anxiety gets better. You may get drowsy or dizzy. Do not drive, use machinery, or do anything that needs mental alertness until you know how this drug affects you. Do not stand or sit up quickly, especially if you are an older patient. This reduces the risk of dizzy or fainting spells. Alcohol can make you more drowsy and dizzy. Avoid alcoholic drinks. What side effects may I notice from receiving this medicine? Side effects that you should report to your doctor or health care professional as soon as possible: -blurred vision or  other vision changes -chest pain -confusion -difficulty breathing -feelings of hostility or anger -muscle aches and pains -numbness or tingling in hands or feet -ringing in the ears -skin rash and itching -vomiting -weakness Side effects that  usually do not require medical attention (report to your doctor or health care professional if they continue or are bothersome): -disturbed dreams, nightmares -headache -nausea -restlessness or nervousness -sore throat and nasal congestion -stomach upset This list may not describe all possible side effects. Call your doctor for medical advice about side effects. You may report side effects to FDA at 1-800-FDA-1088. Where should I keep my medicine? Keep out of the reach of children. Store at room temperature below 30 degrees C (86 degrees F). Protect from light. Keep container tightly closed. Throw away any unused medicine after the expiration date. NOTE: This sheet is a summary. It may not cover all possible information. If you have questions about this medicine, talk to your doctor, pharmacist, or health care provider.  2018 Elsevier/Gold Standard (2010-07-05 18:06:11)

## 2017-12-25 ENCOUNTER — Encounter: Payer: Self-pay | Admitting: Internal Medicine

## 2018-01-01 ENCOUNTER — Encounter: Payer: Self-pay | Admitting: *Deleted

## 2018-01-06 ENCOUNTER — Ambulatory Visit: Payer: Medicare Other | Admitting: Neurology

## 2018-01-08 ENCOUNTER — Encounter: Payer: Self-pay | Admitting: Neurology

## 2018-01-08 ENCOUNTER — Ambulatory Visit: Payer: Medicare Other | Admitting: Neurology

## 2018-01-08 VITALS — BP 158/84 | HR 63 | Wt 150.4 lb

## 2018-01-08 DIAGNOSIS — G459 Transient cerebral ischemic attack, unspecified: Secondary | ICD-10-CM

## 2018-01-08 DIAGNOSIS — R6889 Other general symptoms and signs: Secondary | ICD-10-CM | POA: Diagnosis not present

## 2018-01-08 NOTE — Progress Notes (Signed)
[facility] [address] [place]. [place] [address] [phone]       OFFICE FOLLOW-UP NOTE  [name] Date of Birth:  1931/10/26 Medical Record Number:  [id number]   HPI: [name] is a 84 year old pleasant Caucasian lady seen today for first office follow-up visit following hospital admission for TIA in November 2018. History is obtained from the patient and review of electronic medical records. I personally reviewed the imaging films.Monica Curry is a 84 y.o. female who has a history of essential tremor currently on propranolol and Klonopin, postoperative DVT and PE in 1994, presented to the emergency room at [facility] when she had sudden onset of what she described as confusion, word finding difficulty and slurred speech when she was with her stepdaughter.  Patient reports that she did not feel right, and could not find the words to say.  Her stepdaughter who was with her noticed that the speech coming out of her mouth made no sense and was slurred.  This episode lasted about 5-10 minutes according to her, the chart says 3-5 minutes-and then completely resolved.  She reports she is back at her normal baseline.At baseline, she lives by herself ever since her husband died 105 years ago.  She is able to take care of all her ADLs and finances by herself.She denied any similar episodes in the past. With today's episode, she denies any visual symptoms, headaches, tingling, numbness, weakness.  She denies any nausea vomiting.  She denies any sensation of vertigo. She denies any chest pain or shortness of breath.  She denies any cough fever chills.  She denies any abdominal pain. Of note, on arrival to the [facility] emergency department, her systolic blood pressure was 737, diastolic 90.  She does not have a history of hypertension.  She is very concerned as she did not know why her blood pressures will be still high.She has been on propranolol and Klonopin  for her essential tremor which is managed by an outpatient neurologist Dr. Erling Cruz at [facility] -she has been taking these medications for years.  She had tried primidone in the past which gave her side effects and she discontinued it. She was on anticoagulation for many years after 1994 for her DVT/PE.  She was put on Coumadin at that time.  She discontinued Coumadin many years after and was put on full dose aspirin for many subsequent years.  Following which, she is now on baby aspirin. LKW: 1730 hrs. on 10/23/2017.tpa given?: no, complete resolution of symptoms.Premorbid modified Rankin scale (mRS): 0  CT scan of the head and MRI scan the brain showed no acute abnormality. MRA of the brain showed incidental 5 x 5 mm left supraclinoid ICA and 3 x 2 mm right supraclinoid ICA aneurysms. Carotid ultrasound showed no significant siphon stenosis. Lower eczema to venous Dopplers are negative for DVT. Transthoracic echo showed normal ejection fraction. LDL cholesterol was elevated at 105 mg percent. Hemoglobin A1c was 5.5. Patient was placed on aspirin for stroke prevention and on Lipitor. She states she's done well since discharge is starting aspirin without minor bruising but no bleeding. Her blood pressure normally is well controlled at home but today it is slightly elevated in office at 158/8 for and she blames this on anxiety. She does have a mild upper extremity tremors which are long-standing. She was on primidone in the past and more recently she's been on propranolol and doing well. She complains of intermittent transient episodes in  which she feels dizzy sense of tightness in the throat as well as chest. She feels her heart is stopping it and she nearly faints and blacks out but does not pass out completely. These episodes of previously quite infrequent once every 1-2 months. She did have a cardiac evaluation for this years ago including 30-week-old to monitor which were negative. Since her recent TIA  she's been having more episodes now occurring once or twice a month. She does have an appointment to see a cardiologist next month. She denies significant increase in anxiety though she does take Klonopin every day.    ROS:   14 system review of systems is positive for  Passing out, tremor, speech difficulty and all other systems negative  PMH:  Past Medical History:  Diagnosis Date  . C. difficile colitis   . Cancer [facility])    Endometrial cancer  . Essential and other specified forms of tremor   . Osteoporosis 11/2017   T score -2.7 stable from prior DEXA 2016  . Personal history of venous thrombosis and embolism 1997  . Stroke ([place])   . Thrush   . TIA (transient ischemic attack)     Social History:  Social History   Socioeconomic History  . Marital status: Widowed    Spouse name: Not on file  . Number of children: Not on file  . Years of education: Not on file  . Highest education level: Not on file  Social Needs  . Financial resource strain: Not on file  . Food insecurity - worry: Not on file  . Food insecurity - inability: Not on file  . Transportation needs - medical: Not on file  . Transportation needs - non-medical: Not on file  Occupational History  . Not on file  Tobacco Use  . Smoking status: Never Smoker  . Smokeless tobacco: Never Used  Substance and Sexual Activity  . Alcohol use: Yes    Alcohol/week: 0.0 oz    Comment: ocassionally  . Drug use: No  . Sexual activity: No    Birth control/protection: Post-menopausal, Surgical    Comment: HYST-1st intercourse 84 yo-Fewer than 5 partners  Other Topics Concern  . Not on file  Social History Narrative  . Not on file    Medications:   Current Outpatient Medications on File Prior to Visit  Medication Sig Dispense Refill  . aspirin EC 325 MG EC tablet Take 1 tablet (325 mg total) by mouth daily.    . Calcium-Magnesium-Vitamin D 300-20-200 MG-MG-UNIT CHEW Chew 1 tablet daily by mouth.     .  Cholecalciferol (VITAMIN D3) 5000 units TABS Take 1 tablet daily by mouth.    . clonazePAM (KLONOPIN) 0.5 MG tablet Take 1 pill in AM, 1/2 pill at noon and 1/2 pill at night. 60 tablet 5  . propranolol (INDERAL) 20 MG tablet Take 1 tablet (20 mg total) by mouth 3 (three) times daily. 90 tablet 5   No current facility-administered medications on file prior to visit.     Allergies:   Allergies  Allergen Reactions  . Codeine Hives  . Meloxicam Nausea Only    Gastritis and nausea  . Mysoline [Primidone]   . Pneumovax 23 [Pneumococcal Vac Polyvalent]     Red, raised area.  Knot at site of injection  . Toradol [Ketorolac Tromethamine] Swelling    Localized redness at injection site.      Physical Exam General: well developed, well nourished, seated, in no evident distress Head: head normocephalic  and atraumatic.  Neck: supple with no carotid or supraclavicular bruits Cardiovascular: regular rate and rhythm, no murmurs Musculoskeletal: no deformity Skin:  no rash/petichiae Vascular:  Normal pulses all extremities Vitals:   01/08/18 1142  BP: (!) 158/84  Pulse: 63   Neurologic Exam Mental Status: Awake and fully alert. Oriented to place and time. Recent and remote memory intact. Attention span, concentration and fund of knowledge appropriate. Mood and affect appropriate.  Cranial Nerves: Fundoscopic exam reveals sharp disc margins. Pupils equal, briskly reactive to light. Extraocular movements full without nystagmus. Visual fields full to confrontation. Hearing intact. Facial sensation intact. Face, tongue, palate moves normally and symmetrically.  Motor: Normal bulk and tone. Normal strength in all tested extremity muscles.fine action tremor of outstretched upper extremities right greater than left.no cogwheel rigidity, bradykinesia. No decreased facial expression. Glabellar tap is negative. Sensory.: intact to touch ,pinprick .position and vibratory sensation.  Coordination: Rapid  alternating movements normal in all extremities. Finger-to-nose and heel-to-shin performed accurately bilaterally. Gait and Station: Arises from chair without difficulty. Stance is normal. Gait demonstrates normal stride length and balance . Able to heel, toe and tandem walk with moderatet difficulty.  Reflexes: 1+ and symmetric. Toes downgoing.   NIHSS 0Modified Rankin  1  ASSESSMENT: 65 year lady with transient episode of expressive aphasia due to left hemispheric TIA in November 2018.Vascular risk factors of hyperlipidemia and age. Recurrent stereotypic episodes of near passing out of unclear etiology anxiety/panic versus arrhythmias. Complex partial seizure less likely.Long standing fine action tremor of upper extremity is likely anxiety related with good response to propranolol    PLAN: I had a long d/w patient about his recent TIA and spells of near syncope, risk for recurrent stroke/TIAs, personally independently reviewed imaging studies and stroke evaluation results and answered questions.Continue aspirin 325 mg daily  for secondary stroke prevention and maintain strict control of hypertension with blood pressure goal below 130/90, diabetes with hemoglobin A1c goal below 6.5% and lipids with LDL cholesterol goal below 70 mg/dL. I also advised the patient to eat a healthy diet with plenty of whole grains, cereals, fruits and vegetables, exercise regularly and maintain ideal body weight.continue propranolol in the current dose for her tremor  Check EEG and 63-week-old term monitor to evaluate these episodes of near syncope versus seizure. She was advised to keep scheduled appointment with cardiologist next month Followup in the future with my nurse practitioner Janett Billow in 3 months or call earlier if necessary Greater than 50% of time during this 35 minute visit was spent on counseling,explanation of diagnosis of TIA, planning of further management, discussion with patient and family and  coordination of care Antony Contras, MD  [facility] [address] [place], [place] [address]  Phone [phone] Fax [phone] Note: This document was prepared with digital dictation and possible smart phrase technology. Any transcriptional errors that result from this process are unintentional

## 2018-01-08 NOTE — Patient Instructions (Signed)
I had a long d/w patient about his recent TIA and spells of near syncope, risk for recurrent stroke/TIAs, personally independently reviewed imaging studies and stroke evaluation results and answered questions.Continue aspirin 325 mg daily  for secondary stroke prevention and maintain strict control of hypertension with blood pressure goal below 130/90, diabetes with hemoglobin A1c goal below 6.5% and lipids with LDL cholesterol goal below 70 mg/dL. I also advised the patient to eat a healthy diet with plenty of whole grains, cereals, fruits and vegetables, exercise regularly and maintain ideal body weight.continue propranolol and the current dose for her tremor  Check EEG and 27-week-old term monitor to evaluate these episodes of near syncope versus seizure. She was advised to keep scheduled appointment with cardiologist next month Followup in the future with my nurse practitioner Janett Billow in 3 months or call earlier if necessary   Stroke Prevention Some medical conditions and behaviors are associated with a higher chance of having a stroke. You can help prevent a stroke by making nutrition, lifestyle, and other changes, including managing any medical conditions you may have. What nutrition changes can be made?  Eat healthy foods. You can do this by: ? Choosing foods high in fiber, such as fresh fruits and vegetables and whole grains. ? Eating at least 5 or more servings of fruits and vegetables a day. Try to fill half of your plate at each meal with fruits and vegetables. ? Choosing lean protein foods, such as lean cuts of meat, poultry without skin, fish, tofu, beans, and nuts. ? Eating low-fat dairy products. ? Avoiding foods that are high in salt (sodium). This can help lower blood pressure. ? Avoiding foods that have saturated fat, trans fat, and cholesterol. This can help prevent high cholesterol. ? Avoiding processed and premade foods.  Follow your health care provider's specific guidelines for  losing weight, controlling high blood pressure (hypertension), lowering high cholesterol, and managing diabetes. These may include: ? Reducing your daily calorie intake. ? Limiting your daily sodium intake to 1,500 milligrams (mg). ? Using only healthy fats for cooking, such as olive oil, canola oil, or sunflower oil. ? Counting your daily carbohydrate intake. What lifestyle changes can be made?  Maintain a healthy weight. Talk to your health care provider about your ideal weight.  Get at least 30 minutes of moderate physical activity at least 5 days a week. Moderate activity includes brisk walking, biking, and swimming.  Do not use any products that contain nicotine or tobacco, such as cigarettes and e-cigarettes. If you need help quitting, ask your health care provider. It may also be helpful to avoid exposure to secondhand smoke.  Limit alcohol intake to no more than 1 drink a day for nonpregnant women and 2 drinks a day for men. One drink equals 12 oz of beer, 5 oz of wine, or 1 oz of hard liquor.  Stop any illegal drug use.  Avoid taking birth control pills. Talk to your health care provider about the risks of taking birth control pills if: ? You are over 16 years old. ? You smoke. ? You get migraines. ? You have ever had a blood clot. What other changes can be made?  Manage your cholesterol levels. ? Eating a healthy diet is important for preventing high cholesterol. If cholesterol cannot be managed through diet alone, you may also need to take medicines. ? Take any prescribed medicines to control your cholesterol as told by your health care provider.  Manage your diabetes. ? Eating a  healthy diet and exercising regularly are important parts of managing your blood sugar. If your blood sugar cannot be managed through diet and exercise, you may need to take medicines. ? Take any prescribed medicines to control your diabetes as told by your health care provider.  Control your  hypertension. ? To reduce your risk of stroke, try to keep your blood pressure below 130/80. ? Eating a healthy diet and exercising regularly are an important part of controlling your blood pressure. If your blood pressure cannot be managed through diet and exercise, you may need to take medicines. ? Take any prescribed medicines to control hypertension as told by your health care provider. ? Ask your health care provider if you should monitor your blood pressure at home. ? Have your blood pressure checked every year, even if your blood pressure is normal. Blood pressure increases with age and some medical conditions.  Get evaluated for sleep disorders (sleep apnea). Talk to your health care provider about getting a sleep evaluation if you snore a lot or have excessive sleepiness.  Take over-the-counter and prescription medicines only as told by your health care provider. Aspirin or blood thinners (antiplatelets or anticoagulants) may be recommended to reduce your risk of forming blood clots that can lead to stroke.  Make sure that any other medical conditions you have, such as atrial fibrillation or atherosclerosis, are managed. What are the warning signs of a stroke? The warning signs of a stroke can be easily remembered as BEFAST.  B is for balance. Signs include: ? Dizziness. ? Loss of balance or coordination. ? Sudden trouble walking.  E is for eyes. Signs include: ? A sudden change in vision. ? Trouble seeing.  F is for face. Signs include: ? Sudden weakness or numbness of the face. ? The face or eyelid drooping to one side.  A is for arms. Signs include: ? Sudden weakness or numbness of the arm, usually on one side of the body.  S is for speech. Signs include: ? Trouble speaking (aphasia). ? Trouble understanding.  T is for time. ? These symptoms may represent a serious problem that is an emergency. Do not wait to see if the symptoms will go away. Get medical help right  away. Call your local emergency services (911 in the [place] Do not drive yourself to the hospital.  Other signs of stroke may include: ? A sudden, severe headache with no known cause. ? Nausea or vomiting. ? Seizure.  Where to find more information: For more information, visit:  American Stroke Association: www.strokeassociation.org  National Stroke Association: www.stroke.org  Summary  You can prevent a stroke by eating healthy, exercising, not smoking, limiting alcohol intake, and managing any medical conditions you may have.  Do not use any products that contain nicotine or tobacco, such as cigarettes and e-cigarettes. If you need help quitting, ask your health care provider. It may also be helpful to avoid exposure to secondhand smoke.  Remember BEFAST for warning signs of stroke. Get help right away if you or a loved one has any of these signs. This information is not intended to replace advice given to you by your health care provider. Make sure you discuss any questions you have with your health care provider. Document Released: 01/02/2005 Document Revised: 12/31/2016 Document Reviewed: 12/31/2016 Elsevier Interactive Patient Education  [employer].

## 2018-01-12 ENCOUNTER — Encounter: Payer: Self-pay | Admitting: Physician Assistant

## 2018-01-12 ENCOUNTER — Ambulatory Visit: Payer: Medicare Other | Admitting: Physician Assistant

## 2018-01-12 VITALS — BP 138/70 | HR 83 | Temp 97.6°F | Resp 16 | Ht 64.0 in | Wt 147.6 lb

## 2018-01-12 DIAGNOSIS — R05 Cough: Secondary | ICD-10-CM

## 2018-01-12 DIAGNOSIS — R059 Cough, unspecified: Secondary | ICD-10-CM

## 2018-01-12 MED ORDER — AZITHROMYCIN 250 MG PO TABS
ORAL_TABLET | ORAL | 1 refills | Status: AC
Start: 2018-01-12 — End: 2018-01-17

## 2018-01-12 MED ORDER — ALBUTEROL SULFATE HFA 108 (90 BASE) MCG/ACT IN AERS: 2.0000 | INHALATION_SPRAY | RESPIRATORY_TRACT | 0 refills | Status: DC | PRN

## 2018-01-12 NOTE — Patient Instructions (Addendum)
   Go to the ER if any chest pain, shortness of breath, nausea, dizziness, severe HA, changes vision/speech    HOW TO TREAT VIRAL COUGH AND COLD SYMPTOMS:  -Symptoms usually last at least 1 week with the worst symptoms being around day 4.  - colds usually start with a sore throat and end with a cough, and the cough can take 2 weeks to get better.  -No antibiotics are needed for colds, flu, sore throats, cough, bronchitis UNLESS symptoms are longer than 7 days OR if you are getting better then get drastically worse.  -There are a lot of combination medications (Dayquil, Nyquil, Vicks 44, tyelnol cold and sinus, ETC). Please look at the ingredients on the back so that you are treating the correct symptoms and not doubling up on medications/ingredients.    Medicines you can use  Nasal congestion  Little Remedies saline spray (aerosol/mist)- can try this, it is in the kids section - pseudoephedrine (Sudafed)- behind the counter, do not use if you have high blood pressure, medicine that have -D in them.  - phenylephrine (Sudafed PE) -Dextormethorphan + chlorpheniramine (Coridcidin HBP)- okay if you have high blood pressure -Oxymetazoline (Afrin) nasal spray- LIMIT to 3 days -Saline nasal spray -Neti pot (used distilled or bottled water)  Ear pain/congestion  -pseudoephedrine (sudafed) - Nasonex/flonase nasal spray  Fever  -Acetaminophen (Tyelnol) -Ibuprofen (Advil, motrin, aleve)  Sore Throat  -Acetaminophen (Tyelnol) -Ibuprofen (Advil, motrin, aleve) -Drink a lot of water -Gargle with salt water - Rest your voice (don't talk) -Throat sprays -Cough drops  Body Aches  -Acetaminophen (Tyelnol) -Ibuprofen (Advil, motrin, aleve)  Headache  -Acetaminophen (Tyelnol) -Ibuprofen (Advil, motrin, aleve) - Exedrin, Exedrin Migraine  Allergy symptoms (cough, sneeze, runny nose, itchy eyes) -Claritin or loratadine cheapest but likely the weakest  -Zyrtec or certizine at night because  it can make you sleepy -The strongest is allegra or fexafinadine  Cheapest at [employer], [employer], [employer]  Cough  -Dextromethorphan (Delsym)- medicine that has DM in it -Guafenesin (Mucinex/Robitussin) - cough drops - drink lots of water  Chest Congestion  -Guafenesin (Mucinex/Robitussin)  Red Itchy Eyes  - Naphcon-A  Upset Stomach  - Bland diet (nothing spicy, greasy, fried, and high acid foods like tomatoes, oranges, berries) -OKAY- cereal, bread, soup, crackers, rice -Eat smaller more frequent meals -reduce caffeine, no alcohol -Loperamide (Imodium-AD) if diarrhea -Prevacid for heart burn  General health when sick  -Hydration -wash your hands frequently -keep surfaces clean -change pillow cases and sheets often -Get fresh air but do not exercise strenuously -Vitamin D, double up on it - Vitamin C -Zinc

## 2018-01-12 NOTE — Progress Notes (Signed)
Subjective:    Patient ID: Monica Curry, female    DOB: 11-08-1931, 84 y.o.   MRN: [id number]  HPI 84 y.o. WF with history of HTN, CKD stage 3,  presents with sinus congestion x 2 weeks. She has sinus pressure, sinus pain, cough with mucus. No SOB, no wheezing, no fever, chills. Has tried all of the over the counter medication.   Blood pressure 138/70, pulse 83, temperature 97.6 F (36.4 C), resp. rate 16, height 5\' 4"  (1.626 m), weight 147 lb 9.6 oz (67 kg), SpO2 98 %.  Medications Current Outpatient Medications on File Prior to Visit  Medication Sig  . aspirin EC 325 MG EC tablet Take 1 tablet (325 mg total) by mouth daily.  . Calcium-Magnesium-Vitamin D 300-20-200 MG-MG-UNIT CHEW Chew 1 tablet daily by mouth.   . Cholecalciferol (VITAMIN D3) 5000 units TABS Take 1 tablet daily by mouth.  . clonazePAM (KLONOPIN) 0.5 MG tablet Take 1 pill in AM, 1/2 pill at noon and 1/2 pill at night.  . propranolol (INDERAL) 20 MG tablet Take 1 tablet (20 mg total) by mouth 3 (three) times daily.   No current facility-administered medications on file prior to visit.     Problem list She has History of endometrial cancer; Hx/o DVT/PE; Osteoporosis; Urethral prolapse; Essential hypertension; Mixed hyperlipidemia; Other abnormal glucose; Vitamin D deficiency; Essential tremor; Medication management;  HT CKD 3 (GFR 16ml/min); Medicare annual wellness visit, subsequent; and History of TIA (transient ischemic attack) on their problem list.  Review of Systems  Constitutional: Negative for chills and diaphoresis.  HENT: Positive for congestion, postnasal drip, sinus pressure and sneezing. Negative for ear pain and sore throat.   Respiratory: Positive for cough. Negative for chest tightness, shortness of breath and wheezing.   Cardiovascular: Negative.   Gastrointestinal: Negative.   Genitourinary: Negative.   Musculoskeletal: Negative for neck pain.  Neurological: Positive for headaches.       Objective:   Physical Exam  Constitutional: She is oriented to person, place, and time. She appears well-developed and well-nourished.  HENT:  Right Ear: Hearing and external ear normal. No mastoid tenderness. Tympanic membrane is injected. Tympanic membrane is not perforated, not erythematous, not retracted and not bulging. A middle ear effusion is present.  Left Ear: Hearing and external ear normal. No mastoid tenderness. Tympanic membrane is injected. Tympanic membrane is not perforated, not erythematous, not retracted and not bulging. A middle ear effusion is present.  Nose: Right sinus exhibits maxillary sinus tenderness. Left sinus exhibits maxillary sinus tenderness.  Mouth/Throat: Uvula is midline, oropharynx is clear and moist and mucous membranes are normal.  Eyes: Conjunctivae and EOM are normal. Pupils are equal, round, and reactive to light.  Neck: Neck supple.  Cardiovascular: Normal rate and regular rhythm.  Pulmonary/Chest: Effort normal. No respiratory distress. She has wheezes (LUL wheezing).  Abdominal: Soft. Bowel sounds are normal.  Musculoskeletal: Normal range of motion.  Lymphadenopathy:    She has no cervical adenopathy.  Neurological: She is alert and oriented to person, place, and time.  Skin: Skin is warm and dry.      Assessment & Plan:  Ahmari was seen today for acute visit.  Diagnoses and all orders for this visit:  Cough -     azithromycin (ZITHROMAX) 250 MG tablet; Take 2 tablets (500 mg) on  Day 1,  followed by 1 tablet (250 mg) once daily on Days 2 through 5. -     albuterol (VENTOLIN HFA) 108 (90 Base) MCG/ACT  inhaler; Inhale 2 puffs into the lungs every 4 (four) hours as needed for wheezing or shortness of breath.   The patient was advised to call immediately if she has any concerning symptoms in the interval. The patient voices understanding of current treatment options and is in agreement with the current care plan.The patient knows to call the  clinic with any problems, questions or concerns or go to the ER if any further progression of symptoms.

## 2018-01-13 ENCOUNTER — Telehealth: Payer: Self-pay | Admitting: Neurology

## 2018-01-13 NOTE — Telephone Encounter (Signed)
Rn call Monica Curry at heart care. She stated they do not doi 72 hour monitor. Monica Curry stated he can order a 48 hour monitor, or 30 day monitor. Rn stated a message will be sent to Dr. Leonie Man to determine which one he wants for patient. Monica Curry also stated patient has to be seen at cardiac to receive the monitor. Message sent to Dr. Leonie Man.

## 2018-01-13 NOTE — Telephone Encounter (Signed)
Leanna with [facility] calling stating they reached out to the pt to schedule the appt but pt said the monitor would be mailed. Leanna told her that they cant give a monitor without seeing the pt first, also that they don't have 72 hour monitor. Please contact at [phone]

## 2018-01-14 ENCOUNTER — Other Ambulatory Visit: Payer: Self-pay

## 2018-01-14 DIAGNOSIS — G459 Transient cerebral ischemic attack, unspecified: Secondary | ICD-10-CM

## 2018-01-14 DIAGNOSIS — R6889 Other general symptoms and signs: Secondary | ICD-10-CM

## 2018-01-14 NOTE — Telephone Encounter (Signed)
LEft vm for Monica Curry at [facility] that 30 day cardiac monitor was order. Rn wanted to make sure order was correct. Rn left [facility] number for her to call back.

## 2018-01-14 NOTE — Telephone Encounter (Signed)
I prefer 30 day heart monitor

## 2018-01-14 NOTE — Telephone Encounter (Signed)
30 day cardiac monitor order.

## 2018-01-15 NOTE — Telephone Encounter (Signed)
Spoke with Leanna to state was the heart monitor order correct.Larence Penning stated she will notified me if its not correct. Rn stated a message can be sent if its not correct.Leanna verbalized understanding.

## 2018-01-19 ENCOUNTER — Encounter: Payer: Self-pay | Admitting: Neurology

## 2018-01-19 ENCOUNTER — Ambulatory Visit: Payer: Medicare Other | Admitting: Neurology

## 2018-01-19 VITALS — BP 152/78 | HR 63 | Ht 64.0 in | Wt 151.0 lb

## 2018-01-19 DIAGNOSIS — R6889 Other general symptoms and signs: Secondary | ICD-10-CM | POA: Diagnosis not present

## 2018-01-19 DIAGNOSIS — G459 Transient cerebral ischemic attack, unspecified: Secondary | ICD-10-CM

## 2018-01-19 DIAGNOSIS — G250 Essential tremor: Secondary | ICD-10-CM | POA: Diagnosis not present

## 2018-01-19 MED ORDER — PROPRANOLOL HCL 20 MG PO TABS: 20.0000 mg | ORAL_TABLET | Freq: Three times a day (TID) | ORAL | 5 refills | Status: DC

## 2018-01-19 MED ORDER — CLONAZEPAM 0.5 MG PO TABS: ORAL_TABLET | ORAL | 5 refills | Status: DC

## 2018-01-19 NOTE — Patient Instructions (Addendum)
Please do all the tests and consultations recommended by Dr. Leonie Man.  We will be on the lookout for your EEG and your heart monitor.  You may be a good candidate for a loop monitor for your heart. You can discuss this with Dr. Harrington Challenger during the appointment, when you see her at the end of this month.  Your tremor is stable.   Please look into getting a call alert button, like [employer].    From my end of things we will keep you on low-dose clonazepam on low-dose propranolol for your tremors. We will see you back routinely in 6 months, you can see Jinny Blossom next time again.

## 2018-01-19 NOTE — Progress Notes (Signed)
Subjective:    Patient ID: Monica Curry is a 84 y.o. female.  HPI     Interim history:  Monica Curry is a very pleasant 84 year old right-handed woman with an underlying medical history of uterine cancer, anxiety, status post hysterectomy and tonsillectomy, and recent hospitalization for suspected TIA November 2018, who presents for follow-up consultation of Monica Curry essential tremor. The patient is unaccompanied by today. I last saw Monica Curry on 01/08/2017, at which time Monica Curry was fairly stable and still indicated that clonazepam and Inderal were helpful. I suggested we continue with generic Inderal 20 mg 3 times a day and low-dose clonazepam 3 times a day.   Monica Curry saw Vaughan Browner in the interim on 07/16/2017, at which time Monica Curry was deemed stable and advised to maintain on Monica Curry current medication regimen for symptomatic control for Monica Curry essential tremor.   Monica Curry saw Dr. Leonie Man in follow-up after Monica Curry recent hospital admission for TIA concern on TIA admission on 01/08/2018 and I reviewed his note. Monica Curry was advised to have a Holter monitor for 1 month, Monica Curry was advised to continue with adult size daily aspirin.  Today, 01/19/2018 (all dictated new, as well as above notes, some dictation done in note pad or Word, outside of chart, may appear as copied): Monica Curry reports intermittent episodes of near fainting for years. Monica Curry is scheduled to have a heart monitor on 01/22/18 and EEG scheduled on 01/28/18 and consultation with Cardiol. on 02/05/18.   Monica Curry feels like Monica Curry tremor is stable. Monica Curry has been on low-dose propranolol and low-dose clonazepam for years.  Of note, Monica Curry was hospitalized from 10/23/2017 through 10/24/2017 for a transient episode of expressive aphasia. Monica Curry had TIA/stroke workup. I reviewed the hospital records and test results. MRI brain showed no acute intracranial process, MRA head showed no significant intracranial large vessel occlusion or stenosis. Monica Curry had a small left ICA aneurysm and small right ICA  aneurysm. Echocardiogram showed EF of 55-60 with grade 1 diastolic dysfunction. Carotid Doppler studies showed no significant ICA stenosis, Monica Curry had no therapy needs. LDL was 105, A1c was 5.5.   Monica Curry was started on a statin but discontinued it.  When Monica Curry has an episode of lightheadedness and near fainting Monica Curry feels like Monica Curry is going to fall but Monica Curry has not fallen. Monica Curry does not actually have an episode of actual loss of consciousness. Interestingly, Monica Curry sister had similar episodes and eventually had a pacemaker placed. Patient reports that Monica Curry has chest tightness and it feels like Monica Curry heart stops for moment.   The patient's allergies, current medications, family history, past medical history, past social history, past surgical history and problem list were reviewed and updated as appropriate.    Previously (copied from previous notes for reference):    I saw Monica Curry on 01/08/2016, at which time Monica Curry felt Monica Curry was doing quite well, stable with Monica Curry tremor overall but flareup notable when Monica Curry was stressed out or when anxious. I suggested we continue with Monica Curry current medication regimen for Monica Curry tremors.   I saw Monica Curry on 07/03/2015, at which time Monica Curry reported having had C. 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 Tremor wise Monica Curry felt stable and had no side effects on Monica Curry medications. I suggested Monica Curry continue with the same doses of propranolol and low-dose clonazepam.   I saw Monica Curry on 01/02/2015, at which time Monica Curry reported doing well.  Monica Curry had tried the propranolol long-acting at one time in the past, but it did not work as well. Monica Curry was prescribed trazodone 150 mg by Monica Curry PCP. Monica Curry tried 1/2 pill for about 2 weeks, but it did not help and made Monica Curry drowsy during the day. Monica Curry tried melatonin with no significant success, and Monica Curry bought a white noise machine and stopped  the melatonin. Monica Curry had a reaction to Pneumovax in 2015 and did take the flu shot. I suggested Monica Curry continue low-dose clonazepam and kept Monica Curry on Inderal generic 20 mg 3 times a day.   I saw Monica Curry on 06/30/14, at which time Monica Curry reported doing well fro the tremor standpoint. Monica Curry was having trouble going to sleep and staying asleep. Monica Curry was worried about Monica Curry brother who had heart surgery and was in the hospital. I kept Monica Curry on propranolol and clonazepam.   I saw Monica Curry on 12/31/2013, at which time I felt Monica Curry was stable. I did not change Monica Curry medications. I suggested a routine six-month checkup. Monica Curry reported volunteering at the [facility]. Monica Curry felt stable. Monica Curry reported having a cousin with Parkinson's disease and one aunt with voice tremor. Monica Curry had a bone density scan on 10/14/13 and it showed stable findings.    I first met Monica Curry on 07/02/2013, at which time I felt Monica Curry exam was stable and it did not change Monica Curry medications. I had reviewed recent blood work and did not have any new test at the time. I renewed Monica Curry prescriptions for propranolol and clonazepam at the time.    Monica Curry previously followed with Dr. Morene Antu and last saw him on 12/31/2012, and which time he started Monica Curry on propranolol.   Monica Curry felt the propranolol 60 mg long-acting once daily was not as effective and was switched to propranolol 20 mg tid, clonazepam 0.5 mg tablet one in the morning, 0.25 mg at noon and 0.25 mg in the evening. Activity makes the tremor worse. Monica Curry does note an improvement with alcohol and takes only one propranolol when Monica Curry has had a drink. Monica Curry drinks occasionally.   Monica Curry has a history of tremors for many years, treated with clonazepam for a number of years, but tremor exacerbated in 1997 after Monica Curry pulmonary embolus. Monica Curry had withdrawal symptoms coming off of clonazepam. Monica Curry had been on Mysoline but did not tolerate it. Monica Curry has seen several other doctors for the tremor. TSH was negative in the past. Monica Curry was tried on methazolamide  without benefit. X ray lumbar spine from 03/24/13 showed T9 compression fracture, age indeterminate.  Monica Curry Past Medical History Is Significant For: Past Medical History:  Diagnosis Date  . C. difficile colitis   . Cancer [facility])    Endometrial cancer  . Essential and other specified forms of tremor   . Osteoporosis 11/2017   T score -2.7 stable from prior DEXA 2016  . Personal history of venous thrombosis and embolism 1997  . Stroke ([place])   . Thrush   . TIA (transient ischemic attack)     Monica Curry Past Surgical History Is Significant For: Past Surgical History:  Procedure Laterality Date  . ABDOMINAL HYSTERECTOMY  1997  . [place]ENDECTOMY    [place]  . OOPHORECTOMY     BSO  . TONSILLECTOMY AND ADENOIDECTOMY      Monica Curry Family History Is Significant For: Family History  Problem Relation Age of Onset  . Cancer Mother        Unknown type  . Cancer Father  Bone  . Cancer Brother        Prostate  . Heart disease Brother   . Cancer Brother        Prostate  . Heart disease Brother   . Kidney failure Brother   . Heart disease Sister   . Heart disease Sister   . Kidney failure Sister   . Diabetes Sister     Monica Curry Social History Is Significant For: Social History   Socioeconomic History  . Marital status: Widowed    Spouse name: None  . Number of children: None  . Years of education: None  . Highest education level: None  Social Needs  . Financial resource strain: None  . Food insecurity - worry: None  . Food insecurity - inability: None  . Transportation needs - medical: None  . Transportation needs - non-medical: None  Occupational History  . None  Tobacco Use  . Smoking status: Never Smoker  . Smokeless tobacco: Never Used  Substance and Sexual Activity  . Alcohol use: Yes    Alcohol/week: 0.0 oz    Comment: ocassionally  . Drug use: No  . Sexual activity: No    Birth control/protection: Post-menopausal, Surgical    Comment: HYST-1st  intercourse 84 yo-Fewer than 5 partners  Other Topics Concern  . None  Social History Narrative  . None    Monica Curry Allergies Are:  Allergies  Allergen Reactions  . Codeine Hives  . Meloxicam Nausea Only    Gastritis and nausea  . Mysoline [Primidone]   . Pneumovax 23 [Pneumococcal Vac Polyvalent]     Red, raised area.  Knot at site of injection  . Toradol [Ketorolac Tromethamine] Swelling    Localized redness at injection site.    :   Monica Curry Current Medications Are:  Outpatient Encounter Medications as of 01/19/2018  Medication Sig  . aspirin EC 325 MG EC tablet Take 1 tablet (325 mg total) by mouth daily.  . Calcium-Magnesium-Vitamin D 300-20-200 MG-MG-UNIT CHEW Chew 1 tablet daily by mouth.   . Cholecalciferol (VITAMIN D3) 5000 units TABS Take 1 tablet daily by mouth.  . clonazePAM (KLONOPIN) 0.5 MG tablet Take 1 pill in AM, 1/2 pill at noon and 1/2 pill at night.  . propranolol (INDERAL) 20 MG tablet Take 1 tablet (20 mg total) by mouth 3 (three) times daily.  . [DISCONTINUED] albuterol (VENTOLIN HFA) 108 (90 Base) MCG/ACT inhaler Inhale 2 puffs into the lungs every 4 (four) hours as needed for wheezing or shortness of breath.   No facility-administered encounter medications on file as of 01/19/2018.   :  Review of Systems:  Out of a complete 14 point review of systems, all are reviewed and negative with the exception of these symptoms as listed below: Review of Systems  Neurological:       Pt presents today to discuss Monica Curry tremor. Pt reports that Monica Curry tremor has been stable. Pt has had a recent TIA and has seen Dr. Leonie Man.    Objective:  Neurological Exam  Physical Exam Physical Examination:   Vitals:   01/19/18 1046  BP: (!) 152/78  Pulse: 63   General Examination: The patient is a very pleasant 84 y.o. female in no acute distress. Monica Curry appears well-developed and well-nourished and well groomed.   HEENT: Normocephalic, atraumatic, pupils are equal, round and reactive to  light and accommodation. Funduscopic exam is normal with sharp disc margins noted. Mild b/l cataracts noted. Extraocular tracking is good without limitation to gaze excursion or  nystagmus noted. Normal smooth pursuit is noted. Hearing is grossly intact. Face is symmetric with normal facial animation and normal facial sensation. Speech is clear with no dysarthria noted. There is no hypophonia. There is a mild lower lip and jaw tremor, stable, minimal voice tremor, all stable. Neck is supple with full range of passive and active motion. There are no carotid bruits on auscultation. Oropharynx exam reveals: mild mouth dryness, adequate dental hygiene and mild airway crowding. Mallampati is class II. Tongue protrudes centrally and palate elevates symmetrically.   Chest: Clear to auscultation without wheezing, rhonchi or crackles noted.  Heart: S1+S2+0, regular and normal without murmurs, rubs or gallops noted.   Abdomen: Soft, non-tender and non-distended with normal bowel sounds appreciated on auscultation.  Extremities: There is no pitting edema in the distal lower extremities bilaterally. Pedal pulses are intact.  Skin: Warm and dry without trophic changes noted.  Musculoskeletal: exam reveals no obvious joint deformities, tenderness or joint swelling or erythema.   Neurologically:  Mental status: The patient is awake, alert and oriented in all 4 spheres. Monica Curry immediate and remote memory, attention, language skills and fund of knowledge are appropriate. There is no evidence of aphasia, agnosia, apraxia or anomia. Speech is clear with normal prosody and enunciation. Thought process is linear. Mood is normal and affect is normal.  Cranial nerves II - XII are as described above under HEENT exam.  Motor exam: Normal bulk, strength and tone is noted. There is no drift, or resting tremor or rebound. Monica Curry has a mild and stable postural and action tremor. Reflexes are 1-2+ throughout. Fine motor skills  and coordination: intact for age.   Cerebellar testing: No dysmetria or intention tremor. There is no truncal or gait ataxia.  Sensory exam: intact to light touch in the upper and lower extremities.  Gait, station and balance: Monica Curry stands easily. No veering to one side is noted. No leaning to one side is noted. Posture is age-appropriate and stance is narrow based. Gait shows normal stride length and normal pace. No problems turning are noted. Turns well, good balance, stable for age.                Assessment and Plan:   In summary, Zayleigh Stroh is a very pleasant 84 year old female with an underlying medical history of anxiety, osteoporosis, history of DVT and PE, hypertension, hyperlipidemia, and vitamin D deficiency, s/p C section, Appendectomy, hysterectomy, tonsillectomy, BSO, who presents for follow-up consultation of Monica Curry essential tremor. Monica Curry has had very little and slow progression overyears and overall continues to have benefit from the symptomatic medications, low-dose generic Inderal and low-dose generic clonazepam. Monica Curry did not need any refills today. Monica Curry had interim hospitalization in November 2018 for concern for TIA with transient aphasia. In addition, Monica Curry reports a long-standing history of many years of intermittent presyncopal symptoms, no actual loss of consciousness. Monica Curry has a sister who needed a pacemaker. Patient is encouraged to follow through with Monica Curry upcoming appointments including EEG and heart monitor as ordered by my colleague Dr. Leonie Man, in addition, Monica Curry has an appointment with cardiology coming up later this month. Since Monica Curry symptoms are infrequent, Monica Curry may be a good candidate for a implantable loop monitor. Nevertheless, Monica Curry is advised to discuss this with cardiology. Monica Curry is advised to stay well-hydrated and change positions slowly. Monica Curry is encouraged to look into a call alert button as Monica Curry lives alone. Monica Curry daughter lives in [place].  I suggested a 6 month follow-up,  sooner as needed, with Megan. I answered all Monica Curry questions today and Monica Curry was in agreement.  I spent 25 minutes in total face-to-face time with the patient, more than 50% of which was spent in counseling and coordination of care, reviewing test results, reviewing medication and discussing or reviewing the diagnosis of ET, TIA, the prognosis and treatment options. Pertinent laboratory and imaging test results that were available during this visit with the patient were reviewed by me and considered in my medical decision making (see chart for details).

## 2018-01-21 ENCOUNTER — Other Ambulatory Visit: Payer: Self-pay | Admitting: Neurology

## 2018-01-21 DIAGNOSIS — G459 Transient cerebral ischemic attack, unspecified: Secondary | ICD-10-CM

## 2018-01-21 DIAGNOSIS — I4891 Unspecified atrial fibrillation: Secondary | ICD-10-CM

## 2018-01-21 DIAGNOSIS — R42 Dizziness and giddiness: Secondary | ICD-10-CM

## 2018-01-21 DIAGNOSIS — R6889 Other general symptoms and signs: Secondary | ICD-10-CM

## 2018-01-21 DIAGNOSIS — R55 Syncope and collapse: Secondary | ICD-10-CM

## 2018-01-22 ENCOUNTER — Ambulatory Visit (INDEPENDENT_AMBULATORY_CARE_PROVIDER_SITE_OTHER): Payer: Medicare Other

## 2018-01-22 DIAGNOSIS — R6889 Other general symptoms and signs: Secondary | ICD-10-CM

## 2018-01-22 DIAGNOSIS — R55 Syncope and collapse: Secondary | ICD-10-CM | POA: Diagnosis not present

## 2018-01-22 DIAGNOSIS — R42 Dizziness and giddiness: Secondary | ICD-10-CM

## 2018-01-22 DIAGNOSIS — G459 Transient cerebral ischemic attack, unspecified: Secondary | ICD-10-CM | POA: Diagnosis not present

## 2018-01-28 ENCOUNTER — Ambulatory Visit: Payer: Medicare Other

## 2018-01-28 DIAGNOSIS — R55 Syncope and collapse: Secondary | ICD-10-CM

## 2018-01-28 DIAGNOSIS — G459 Transient cerebral ischemic attack, unspecified: Secondary | ICD-10-CM

## 2018-01-28 DIAGNOSIS — R6889 Other general symptoms and signs: Secondary | ICD-10-CM

## 2018-01-29 ENCOUNTER — Encounter: Payer: Self-pay | Admitting: Internal Medicine

## 2018-02-04 ENCOUNTER — Telehealth: Payer: Self-pay | Admitting: Neurology

## 2018-02-04 NOTE — Telephone Encounter (Signed)
Notes recorded by Marval Regal, RN on 02/04/2018 at 3:55 PM EST Rn call patient that the EEG was normal. PT verbalized understanding. ------

## 2018-02-04 NOTE — Telephone Encounter (Signed)
Pt is asking for a call with the EEG results when they are available

## 2018-02-05 ENCOUNTER — Ambulatory Visit: Payer: Medicare Other | Admitting: Internal Medicine

## 2018-02-05 ENCOUNTER — Encounter: Payer: Self-pay | Admitting: Internal Medicine

## 2018-02-05 VITALS — BP 162/90 | HR 63 | Ht 64.0 in | Wt 152.0 lb

## 2018-02-05 DIAGNOSIS — G459 Transient cerebral ischemic attack, unspecified: Secondary | ICD-10-CM

## 2018-02-05 DIAGNOSIS — I10 Essential (primary) hypertension: Secondary | ICD-10-CM | POA: Diagnosis not present

## 2018-02-05 DIAGNOSIS — R42 Dizziness and giddiness: Secondary | ICD-10-CM

## 2018-02-05 NOTE — Progress Notes (Signed)
Cardiology Office Note   Date:  6/76/1948   ID:  Monica Curry, DOB [birth year], MRN [id number]  PCP:  Unk Pinto, MD  Cardiologist:   Dorris Carnes, MD   Pt referred for eval of presynope by Dr Melford Aase    History of Present Illness: Monica Curry is a 84 y.o. female referred for   Feeling like about to pass out  Close  Feels like heart is about to stop then starts up    Seen by cardiologist  In past  Pt had a TIA in November   Expressive aphasia.  Resolved  No further spells  Seen by neuro  Admitted at time   2 wks after has spell of presyncope (December)  Occurred while driving.   Had another spell in January  During teeth cleaning at dentist  She has had spells of presyncope  In past that began before 2004  Would have a few times per year  Felt fine between spells  No syncope   After TIA in Novmeber  she has felt more washed out in general  Also, her  BP has been labile  Current Meds  Medication Sig  . aspirin EC 325 MG EC tablet Take 1 tablet (325 mg total) by mouth daily.  . Calcium-Magnesium-Vitamin D 300-20-200 MG-MG-UNIT CHEW Chew 1 tablet daily by mouth.   . Cholecalciferol (VITAMIN D3) 5000 units TABS Take 1 tablet daily by mouth.  . clonazePAM (KLONOPIN) 0.5 MG tablet Take 1 pill in AM, 1/2 pill at noon and 1/2 pill at night.  . propranolol (INDERAL) 20 MG tablet Take 1 tablet (20 mg total) by mouth 3 (three) times daily.     Allergies:   Codeine; Meloxicam; Mysoline [primidone]; Pneumovax 23 [pneumococcal vac polyvalent]; and Toradol [ketorolac tromethamine]   Past Medical History:  Diagnosis Date  . C. difficile colitis   . Cancer [facility])    Endometrial cancer  . Essential and other specified forms of tremor   . Osteoporosis 11/2017   T score -2.7 stable from prior DEXA 2016  . Personal history of venous thrombosis and embolism 1997  . Stroke ([place])   . Thrush   . TIA (transient ischemic attack)     Past Surgical History:  Procedure Laterality  Date  . ABDOMINAL HYSTERECTOMY  1997  . APPENDECTOMY    . [place]  . OOPHORECTOMY     BSO  . TONSILLECTOMY AND ADENOIDECTOMY       Social History:  The patient  reports that  has never smoked. she has never used smokeless tobacco. She reports that she drinks alcohol. She reports that she does not use drugs.   Family History:  The patient's family history includes Cancer in her brother, brother, father, and mother; Diabetes in her sister; Heart disease in her brother, brother, sister, and sister; Kidney failure in her brother and sister.    ROS:  Please see the history of present illness. All other systems are reviewed and  Negative to the above problem except as noted.    PHYSICAL EXAM: VS:  BP (!) 162/90   Pulse 63   Ht 5\' 4"  (1.626 m)   Wt 152 lb (68.9 kg)   BMI 26.09 kg/m   GEN: Well nourished, well developed, in no acute distress  HEENT: normal  Neck: no JVD, carotid bruits, or masses Cardiac: RRR; no murmurs, rubs, or gallops,no edema  Respiratory:  clear to auscultation bilaterally, normal work of breathing GI: soft, nontender, nondistended, +  BS  No hepatomegaly  MS: no deformity Moving all extremities   Skin: warm and dry, no rash Neuro:  Strength and sensation are intact Psych: euthymic mood, full affect   EKG:  EKG is ordered today.  SR 63  RBBB.  Lipid Panel    Component Value Date/Time   CHOL 166 10/24/2017 0629   TRIG 122 10/24/2017 0629   HDL 37 (L) 10/24/2017 0629   CHOLHDL 4.5 10/24/2017 0629   VLDL 24 10/24/2017 0629   LDLCALC 105 (H) 10/24/2017 0629      Wt Readings from Last 3 Encounters:  02/05/18 152 lb (68.9 kg)  01/19/18 151 lb (68.5 kg)  01/12/18 147 lb 9.6 oz (67 kg)      ASSESSMENT AND PLAN:  1  Presynope   Etiology unclear  BP is high now   I would not change meds   Set up for event monitor  Follow  May need ILR  2  Hx TIA   Some work up in [place]   Did nto have event monitor   Nor TEE   Would set up for event  monitor for now   Encouraged pt to stay hydrated     Current medicines are reviewed at length with the patient today.  The patient does not have concerns regarding medicines.  Signed, Dorris Carnes, MD  02/05/2018 2:08 PM    [facility] [place], [place], [place]  [id number] Phone: [phone]; Fax: [phone]

## 2018-02-05 NOTE — Patient Instructions (Signed)
Your physician recommends that you continue on your current medications as directed. Please refer to the Current Medication list given to you today. Follow up with your physician will depend on results of event monitor.

## 2018-02-10 ENCOUNTER — Telehealth: Payer: Self-pay | Admitting: Internal Medicine

## 2018-02-10 NOTE — Telephone Encounter (Signed)
Spoke with the pt about recorded event noted and faxed by [employer] from 3/4 around noon.  Pt states for a few seconds at that time, she felt a brief episode of dizziness.  Pt states she only had dizziness during that 12 seconds, and all symptoms went away thereafter.  Pt states she had no palpitations, cp, sob, doe, blurred vision, N/V, pre-syncopal or syncopal episodes. She ONLY had dizziness.  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  Pt states after that few seconds went away, she became asymptomatic, and has remained that way.  Pt states she feels perfectly fine today. Pt confirms she is taking all her meds prescribed.   Informed the pt that I will go and show her event monitor recording from yesterday, to our DOD Dr. Irish Lack, for further review and recommendation.  Informed the pt that Dr Harrington Challenger is out of the office today. Informed the pt that I will follow-up with her shortly thereafter, once the DOD reviews this episode.  Pt verbalized understanding and agrees with this plan.

## 2018-02-10 NOTE — Telephone Encounter (Signed)
Follow up    Patient it returning call. Please call to discuss.

## 2018-02-10 NOTE — Telephone Encounter (Signed)
Spoke with the pt and informed her that Dr. Irish Lack DOD reviewed her monitor recording and advised to continue monitoring, and route this message to Dr Harrington Challenger and RN for their review, upon return to the office.  Will place the monitor in Dr Alan Ripper mailbox for her review.  Advised the pt to continue pressing the button when she becomes symptomatic.  There was no follow-up strips provided, for Dr Irish Lack to review.  There were no abnormal recordings noted in [employer], from today.  Pt states she has been asymptomatic all day today.  Pt verbalized understanding and agrees with this plan.

## 2018-02-10 NOTE — Telephone Encounter (Signed)
Critical monitor received.  On 02/09/18 at 12:48PM CT pt went into SVT (12 sec) at a rate of 162BPM, then Simus rhythm with PAC's.  Monitor tracing triggered by pt for dizziness.  Attempted to call pt and left message to call back.

## 2018-02-12 ENCOUNTER — Ambulatory Visit: Payer: Medicare Other | Admitting: Internal Medicine

## 2018-02-12 VITALS — BP 120/82 | HR 56 | Temp 97.8°F | Resp 16 | Ht 64.75 in | Wt 150.8 lb

## 2018-02-12 DIAGNOSIS — R7309 Other abnormal glucose: Secondary | ICD-10-CM

## 2018-02-12 DIAGNOSIS — E782 Mixed hyperlipidemia: Secondary | ICD-10-CM

## 2018-02-12 DIAGNOSIS — G250 Essential tremor: Secondary | ICD-10-CM

## 2018-02-12 DIAGNOSIS — I10 Essential (primary) hypertension: Secondary | ICD-10-CM | POA: Diagnosis not present

## 2018-02-12 DIAGNOSIS — E559 Vitamin D deficiency, unspecified: Secondary | ICD-10-CM

## 2018-02-12 DIAGNOSIS — R7303 Prediabetes: Secondary | ICD-10-CM | POA: Diagnosis not present

## 2018-02-12 DIAGNOSIS — Z79899 Other long term (current) drug therapy: Secondary | ICD-10-CM | POA: Diagnosis not present

## 2018-02-12 MED ORDER — PROPRANOLOL HCL 40 MG PO TABS: ORAL_TABLET | ORAL | 1 refills | Status: DC

## 2018-02-12 MED ORDER — ATORVASTATIN CALCIUM 20 MG PO TABS: ORAL_TABLET | ORAL | 1 refills | Status: DC

## 2018-02-12 NOTE — Patient Instructions (Signed)

## 2018-02-12 NOTE — Telephone Encounter (Signed)
Spoke to pt  Spell she had on 3/4 was very short   One in Jan was much longer   Monitor shows SVT  Will review with EP Keep using monitor

## 2018-02-12 NOTE — Progress Notes (Signed)
This very nice 84 y.o.  WWFpresents for 3 month follow up with HTN, ASCVD, HLD, Pre-Diabetes, Familial Tremor  and Vitamin D Deficiency.      Patient is treated for HTN & BP has been controlled at home. Today's BP is at goal -120/82. Patient had an overnight hospital observation in  Nov 2918 with a Lt Brain TIA with an expressive Aphasia which recovered 100%. W/U was negative and she was released on LD BASA 81 mg & has done well since that time.  Patient has had no complaints of any cardiac type chest pain, palpitations, dyspnea / orthopnea / PND, dizziness, claudication, or dependent edema.     Hyperlipidemia is controlled with diet & meds. Patient denies myalgias or other med SE's. Last Lipids were  Lab Results  Component Value Date   CHOL 170 02/12/2018   HDL 42 (L) 02/12/2018   LDLCALC 105 (H) 10/24/2017   TRIG 152 (H) 02/12/2018   CHOLHDL 4.0 02/12/2018      Also, the patient has history of PreDiabetes (A21c 5.7% /2016) and has had no symptoms of reactive hypoglycemia, diabetic polys, paresthesias or visual blurring.  Last A1c was Normal & at goal: Lab Results  Component Value Date   HGBA1C 5.4 02/12/2018      Further, the patient also has history of Vitamin D Deficiency and supplements vitamin D without any suspected side-effects. Last vitamin D was at goal:   Lab Results  Component Value Date   VD25OH 59 02/12/2018   Current Outpatient Medications on File Prior to Visit  Medication Sig  . aspirin EC 325 MG EC tablet Take 1 tablet (325 mg total) by mouth daily.  . Calcium-Magnesium-Vitamin D 300-20-200 MG-MG-UNIT CHEW Chew 1 tablet daily by mouth.   . Cholecalciferol (VITAMIN D3) 5000 units TABS Take 1 tablet daily by mouth.  . clonazePAM (KLONOPIN) 0.5 MG tablet Take 1 pill in AM, 1/2 pill at noon and 1/2 pill at night.   No current facility-administered medications on file prior to visit.    Allergies  Allergen Reactions  . Codeine Hives  . Meloxicam Nausea Only   Gastritis and nausea  . Mysoline [Primidone]   . Pneumovax 23 [Pneumococcal Vac Polyvalent]     Red, raised area.  Knot at site of injection  . Toradol [Ketorolac Tromethamine] Swelling    Localized redness at injection site.     PMHx:   Past Medical History:  Diagnosis Date  . C. difficile colitis   . Cancer [facility])    Endometrial cancer  . Essential and other specified forms of tremor   . Osteoporosis 11/2017   T score -2.7 stable from prior DEXA 2016  . Personal history of venous thrombosis and embolism 1997  . Stroke ([place])   . Thrush   . TIA (transient ischemic attack)    Immunization History  Administered Date(s) Administered  . H1N1 01/03/2009  . Influenza Whole 09/23/2013  . Influenza, High Dose Seasonal PF 09/06/2014, 08/23/2015, 08/21/2016, 08/28/2017  . PPD Test 03/29/2014  . Pneumococcal Polysaccharide-23 03/29/2014  . Pneumococcal-Unspecified 12/09/2002  . Td 03/23/2013  . Zoster 12/09/2006   Past Surgical History:  Procedure Laterality Date  . ABDOMINAL HYSTERECTOMY  1997  . APPENDECTOMY    . [place]  . OOPHORECTOMY     BSO  . TONSILLECTOMY AND ADENOIDECTOMY     FHx:    Reviewed / unchanged  SHx:    Reviewed / unchanged  Systems Review:  Constitutional: Denies fever, chills, wt changes, headaches, insomnia, fatigue, night sweats, change in appetite. Eyes: Denies redness, blurred vision, diplopia, discharge, itchy, watery eyes.  ENT: Denies discharge, congestion, post nasal drip, epistaxis, sore throat, earache, hearing loss, dental pain, tinnitus, vertigo, sinus pain, snoring.  CV: Denies chest pain, palpitations, irregular heartbeat, syncope, dyspnea, diaphoresis, orthopnea, PND, claudication or edema. Respiratory: denies cough, dyspnea, DOE, pleurisy, hoarseness, laryngitis, wheezing.  Gastrointestinal: Denies dysphagia, odynophagia, heartburn, reflux, water brash, abdominal pain or cramps, nausea, vomiting, bloating, diarrhea,  constipation, hematemesis, melena, hematochezia  or hemorrhoids. Genitourinary: Denies dysuria, frequency, urgency, nocturia, hesitancy, discharge, hematuria or flank pain. Musculoskeletal: Denies arthralgias, myalgias, stiffness, jt. swelling, pain, limping or strain/sprain.  Skin: Denies pruritus, rash, hives, warts, acne, eczema or change in skin lesion(s). Neuro: No weakness, tremor, incoordination, spasms, paresthesia or pain. Psychiatric: Denies confusion, memory loss or sensory loss. Endo: Denies change in weight, skin or hair change.  Heme/Lymph: No excessive bleeding, bruising or enlarged lymph nodes.  Physical Exam  BP 120/82   Pulse (!) 56   Temp 97.8 F (36.6 C)   Resp 16   Ht 5' 4.75" (1.645 m)   Wt 150 lb 12.8 oz (68.4 kg)   BMI 25.29 kg/m   Appears  well nourished, well groomed  and in no distress.  Eyes: PERRLA, EOMs, conjunctiva no swelling or erythema. Sinuses: No frontal/maxillary tenderness ENT/Mouth: EAC's clear, TM's nl w/o erythema, bulging. Nares clear w/o erythema, swelling, exudates. Oropharynx clear without erythema or exudates. Oral hygiene is good. Tongue normal, non obstructing. Hearing intact.  Neck: Supple. Thyroid not palpable. Car 2+/2+ without bruits, nodes or JVD. Chest: Respirations nl with BS clear & equal w/o rales, rhonchi, wheezing or stridor.  Cor: Heart sounds normal w/ regular rate and rhythm without sig. murmurs, gallops, clicks or rubs. Peripheral pulses normal and equal  without edema.  Abdomen: Soft & bowel sounds normal. Non-tender w/o guarding, rebound, hernias, masses or organomegaly.  Lymphatics: Unremarkable.  Musculoskeletal: Full ROM all peripheral extremities, joint stability, 5/5 strength and normal gait.  Skin: Warm, dry without exposed rashes, lesions or ecchymosis apparent.  Neuro: Cranial nerves intact, reflexes equal bilaterally. Sensory-motor testing grossly intact. Tendon reflexes grossly intact.  Pysch: Alert &  oriented x 3.  Insight and judgement nl & appropriate. No ideations.  Assessment and Plan:  1. Essential hypertension  - Continue medication, monitor blood pressure at home.  - Continue DASH diet. Reminder to go to the ER if any CP,  SOB, nausea, dizziness, severe HA, changes vision/speech.  - CBC with Differential/Platelet - BASIC METABOLIC PANEL WITH GFR - Magnesium - TSH   2. Hyperlipidemia, mixed  - Continue diet/meds, exercise,& lifestyle modifications.  - Continue monitor periodic cholesterol/liver & renal functions   - Hepatic function panel - TSH - Lipid panel  3. Abnormal glucose  - Continue diet, exercise, lifestyle modifications.  - Monitor appropriate labs. - Hemoglobin A1c - Insulin, random  4. Vitamin D deficiency  - Continue supplementation.  - VITAMIN D 25 Hydroxyl  5. Prediabetes  - Hemoglobin A1c - Insulin, random  6. Essential tremor  - propranolol (INDERAL) 40 MG tablet; Take 1 tablet 4 x / day with meals & Bedtime for BP & Tremor  Dispense: 360 tablet; Refill: 1  7. Medication management  - CBC with Differential/Platelet - BASIC METABOLIC PANEL WITH GFR - Hepatic function panel - Magnesium - TSH - Hemoglobin A1c - Insulin, random - VITAMIN D 25 Hydroxy  - Lipid panel  Discussed  regular exercise, BP monitoring, weight control to achieve/maintain BMI less than 25 and discussed med and SE's. Recommended labs to assess and monitor clinical status with further disposition pending results of labs. Over 30 minutes of exam, counseling, chart review was performed.

## 2018-02-13 LAB — CBC WITH DIFFERENTIAL/PLATELET
BASOS ABS: 50 {cells}/uL (ref 0–200)
BASOS PCT: 1 %
EOS ABS: 130 {cells}/uL (ref 15–500)
Eosinophils Relative: 2.6 %
HCT: 43 % (ref 35.0–45.0)
HEMOGLOBIN: 14.3 g/dL (ref 11.7–15.5)
Lymphs Abs: 2340 cells/uL (ref 850–3900)
MCH: 28.4 pg (ref 27.0–33.0)
MCHC: 33.3 g/dL (ref 32.0–36.0)
MCV: 85.3 fL (ref 80.0–100.0)
MPV: 10.4 fL (ref 7.5–12.5)
Monocytes Relative: 7.9 %
NEUTROS ABS: 2085 {cells}/uL (ref 1500–7800)
Neutrophils Relative %: 41.7 %
Platelets: 254 10*3/uL (ref 140–400)
RBC: 5.04 10*6/uL (ref 3.80–5.10)
RDW: 13.4 % (ref 11.0–15.0)
Total Lymphocyte: 46.8 %
WBC: 5 10*3/uL (ref 3.8–10.8)
WBCMIX: 395 {cells}/uL (ref 200–950)

## 2018-02-13 LAB — LIPID PANEL
CHOL/HDL RATIO: 4 (calc) (ref <5.0)
CHOLESTEROL: 170 mg/dL (ref <200)
HDL: 42 mg/dL — ABNORMAL LOW (ref >50)
LDL CHOLESTEROL (CALC): 103 mg/dL — AB
Non-HDL Cholesterol (Calc): 128 mg/dL (calc) (ref <130)
Triglycerides: 152 mg/dL — ABNORMAL HIGH (ref <150)

## 2018-02-13 LAB — HEMOGLOBIN A1C
EAG (MMOL/L): 6 (calc)
Hgb A1c MFr Bld: 5.4 % of total Hgb (ref <5.7)
Mean Plasma Glucose: 108 (calc)

## 2018-02-13 LAB — BASIC METABOLIC PANEL WITH GFR
BUN / CREAT RATIO: 17 (calc) (ref 6–22)
BUN: 20 mg/dL (ref 7–25)
CO2: 29 mmol/L (ref 20–32)
CREATININE: 1.21 mg/dL — AB (ref 0.60–0.88)
Calcium: 9.5 mg/dL (ref 8.6–10.4)
Chloride: 103 mmol/L (ref 98–110)
GFR, EST AFRICAN AMERICAN: 48 mL/min/{1.73_m2} — AB (ref >=60)
GFR, EST NON AFRICAN AMERICAN: 41 mL/min/{1.73_m2} — AB (ref >=60)
Glucose, Bld: 96 mg/dL (ref 65–99)
Potassium: 4.6 mmol/L (ref 3.5–5.3)
Sodium: 139 mmol/L (ref 135–146)

## 2018-02-13 LAB — HEPATIC FUNCTION PANEL
AG RATIO: 1.7 (calc) (ref 1.0–2.5)
ALT: 9 U/L (ref 6–29)
AST: 14 U/L (ref 10–35)
Albumin: 4.3 g/dL (ref 3.6–5.1)
Alkaline phosphatase (APISO): 37 U/L (ref 33–130)
BILIRUBIN DIRECT: 0.1 mg/dL (ref 0.0–0.2)
BILIRUBIN INDIRECT: 0.4 mg/dL (ref 0.2–1.2)
BILIRUBIN TOTAL: 0.5 mg/dL (ref 0.2–1.2)
GLOBULIN: 2.6 g/dL (ref 1.9–3.7)
TOTAL PROTEIN: 6.9 g/dL (ref 6.1–8.1)

## 2018-02-13 LAB — MAGNESIUM: Magnesium: 1.9 mg/dL (ref 1.5–2.5)

## 2018-02-13 LAB — INSULIN, RANDOM: Insulin: 3.6 u[IU]/mL (ref 2.0–19.6)

## 2018-02-13 LAB — VITAMIN D 25 HYDROXY (VIT D DEFICIENCY, FRACTURES): Vit D, 25-Hydroxy: 59 ng/mL (ref 30–100)

## 2018-02-13 LAB — TSH: TSH: 1.66 m[IU]/L (ref 0.40–4.50)

## 2018-02-15 ENCOUNTER — Encounter: Payer: Self-pay | Admitting: Internal Medicine

## 2018-02-16 ENCOUNTER — Telehealth: Payer: Self-pay | Admitting: Neurology

## 2018-02-16 NOTE — Telephone Encounter (Signed)
FYI Pt states she is on propranolol (INDERAL) 40 MG tablet for tremors she states Dr Melford Aase has increased it for Blood pressure, Dr Melford Aase will re evaluate her again for 2 weeks.  No call back requested

## 2018-02-17 NOTE — Addendum Note (Signed)
Addended by: Patterson Hammersmith A on: 02/17/2018 08:31 AM   Modules accepted: Orders

## 2018-02-26 ENCOUNTER — Encounter: Payer: Self-pay | Admitting: Physician Assistant

## 2018-02-26 ENCOUNTER — Ambulatory Visit: Payer: Medicare Other | Admitting: Physician Assistant

## 2018-02-26 ENCOUNTER — Ambulatory Visit: Payer: Self-pay | Admitting: Internal Medicine

## 2018-02-26 VITALS — BP 128/72 | HR 89 | Temp 97.6°F | Resp 14 | Ht 64.75 in | Wt 151.0 lb

## 2018-02-26 DIAGNOSIS — I10 Essential (primary) hypertension: Secondary | ICD-10-CM

## 2018-02-26 DIAGNOSIS — G250 Essential tremor: Secondary | ICD-10-CM | POA: Diagnosis not present

## 2018-02-26 NOTE — Patient Instructions (Signed)
Monitor your blood pressure at home, please keep a record and bring that in with you to your next office visit.   Go to the ER if any CP, SOB, nausea, dizziness, severe HA, changes vision/speech  Due to a recent study, SPRINT, we have changed our goal for the systolic or top blood pressure number. Ideally we want your top number at 120.  In the [facility] Trial, 5000 people were randomized to a goal BP of 120 and 5000 people were randomized to a goal BP of less than 140. The patients with the goal BP at 120 had LESS DEMENTIA, LESS HEART ATTACKS, AND LESS STROKES, AS WELL AS OVERALL DECREASED MORTALITY OR DEATH RATE.   If you are willing, our goal BP is the top number of 120.  Your most recent BP: BP: 128/72   Take your medications faithfully as instructed. Maintain a healthy weight. Get at least 150 minutes of aerobic exercise per week. Minimize salt intake. Minimize alcohol intake  DASH Eating Plan DASH stands for "Dietary Approaches to Stop Hypertension." The DASH eating plan is a healthy eating plan that has been shown to reduce high blood pressure (hypertension). Additional health benefits may include reducing the risk of type 2 diabetes mellitus, heart disease, and stroke. The DASH eating plan may also help with weight loss. WHAT DO I NEED TO KNOW ABOUT THE DASH EATING PLAN? For the DASH eating plan, you will follow these general guidelines:  Choose foods with a percent daily value for sodium of less than 5% (as listed on the food label).  Use salt-free seasonings or herbs instead of table salt or sea salt.  Check with your health care provider or pharmacist before using salt substitutes.  Eat lower-sodium products, often labeled as "lower sodium" or "no salt added."  Eat fresh foods.  Eat more vegetables, fruits, and low-fat dairy products.  Choose whole grains. Look for the word "whole" as the first word in the ingredient list.  Choose fish and skinless chicken or [place] more  often than red meat. Limit fish, poultry, and meat to 6 oz (170 g) each day.  Limit sweets, desserts, sugars, and sugary drinks.  Choose heart-healthy fats.  Limit cheese to 1 oz (28 g) per day.  Eat more home-cooked food and less restaurant, buffet, and fast food.  Limit fried foods.  Cook foods using methods other than frying.  Limit canned vegetables. If you do use them, rinse them well to decrease the sodium.  When eating at a restaurant, ask that your food be prepared with less salt, or no salt if possible. WHAT FOODS CAN I EAT? Seek help from a dietitian for individual calorie needs. Grains Whole grain or whole wheat bread. Brown rice. Whole grain or whole wheat pasta. Quinoa, bulgur, and whole grain cereals. Low-sodium cereals. Corn or whole wheat flour tortillas. Whole grain cornbread. Whole grain crackers. Low-sodium crackers. Vegetables Fresh or frozen vegetables (raw, steamed, roasted, or grilled). Low-sodium or reduced-sodium tomato and vegetable juices. Low-sodium or reduced-sodium tomato sauce and paste. Low-sodium or reduced-sodium canned vegetables.  Fruits All fresh, canned (in natural juice), or frozen fruits. Meat and Other Protein Products Ground beef (85% or leaner), grass-fed beef, or beef trimmed of fat. Skinless chicken or [place]. Ground chicken or [place]. Pork trimmed of fat. All fish and seafood. Eggs. Dried beans, peas, or lentils. Unsalted nuts and seeds. Unsalted canned beans. Dairy Low-fat dairy products, such as skim or 1% milk, 2% or reduced-fat cheeses, low-fat ricotta or  cottage cheese, or plain low-fat yogurt. Low-sodium or reduced-sodium cheeses. Fats and Oils Tub margarines without trans fats. Light or reduced-fat mayonnaise and salad dressings (reduced sodium). Avocado. Safflower, olive, or canola oils. Natural peanut or almond butter. Other Unsalted popcorn and pretzels. The items listed above may not be a complete list of recommended foods or  beverages. Contact your dietitian for more options. WHAT FOODS ARE NOT RECOMMENDED? Grains White bread. White pasta. White rice. Refined cornbread. Bagels and croissants. Crackers that contain trans fat. Vegetables Creamed or fried vegetables. Vegetables in a cheese sauce. Regular canned vegetables. Regular canned tomato sauce and paste. Regular tomato and vegetable juices. Fruits Dried fruits. Canned fruit in light or heavy syrup. Fruit juice. Meat and Other Protein Products Fatty cuts of meat. Ribs, chicken wings, bacon, sausage, bologna, salami, chitterlings, fatback, hot dogs, bratwurst, and packaged luncheon meats. Salted nuts and seeds. Canned beans with salt. Dairy Whole or 2% milk, cream, half-and-half, and cream cheese. Whole-fat or sweetened yogurt. Full-fat cheeses or blue cheese. Nondairy creamers and whipped toppings. Processed cheese, cheese spreads, or cheese curds. Condiments Onion and garlic salt, seasoned salt, table salt, and sea salt. Canned and packaged gravies. Worcestershire sauce. Tartar sauce. Barbecue sauce. Teriyaki sauce. Soy sauce, including reduced sodium. Steak sauce. Fish sauce. Oyster sauce. Cocktail sauce. Horseradish. Ketchup and mustard. Meat flavorings and tenderizers. Bouillon cubes. Hot sauce. Tabasco sauce. Marinades. Taco seasonings. Relishes. Fats and Oils Butter, stick margarine, lard, shortening, ghee, and bacon fat. Coconut, palm kernel, or palm oils. Regular salad dressings. Other Pickles and olives. Salted popcorn and pretzels. The items listed above may not be a complete list of foods and beverages to avoid. Contact your dietitian for more information. WHERE CAN I FIND MORE INFORMATION? National Heart, Lung, and Blood Institute: travelstabloid.com Document Released: 11/14/2011 Document Revised: 04/11/2014 Document Reviewed: 09/29/2013 [facility] Patient Information 2015 [place], [place]. This information is not  intended to replace advice given to you by your health care provider. Make sure you discuss any questions you have with your health care provider.

## 2018-02-26 NOTE — Progress Notes (Signed)
   Subjective:    Patient ID: Monica Curry, female    DOB: [birth year], 84 y.o.   MRN: [id number]  HPI 84 y.o. WF presents for 2 week follow up for BP after the addition of propanolol 1 pill 4 x a day last visit for tremor. Her BP has improved/been better. She states the tremor has not improved as much as she would hope but her BP has been doing well. BP 128.72-119/80, highest 1 time was 150/79.   Blood pressure 128/72, pulse 89, temperature 97.6 F (36.4 C), resp. rate 14, height 5' 4.75" (1.645 m), weight 151 lb (68.5 kg), SpO2 96 %.  Medications Current Outpatient Medications on File Prior to Visit  Medication Sig  . aspirin EC 325 MG EC tablet Take 1 tablet (325 mg total) by mouth daily.  Marland Kitchen atorvastatin (LIPITOR) 20 MG tablet Take 1 tablet daily for Cholesterol  . Calcium-Magnesium-Vitamin D 300-20-200 MG-MG-UNIT CHEW Chew 1 tablet daily by mouth.   . Cholecalciferol (VITAMIN D3) 5000 units TABS Take 1 tablet daily by mouth.  . clonazePAM (KLONOPIN) 0.5 MG tablet Take 1 pill in AM, 1/2 pill at noon and 1/2 pill at night.  . propranolol (INDERAL) 40 MG tablet Take 1 tablet 4 x / day with meals & Bedtime for BP & Tremor   No current facility-administered medications on file prior to visit.     Problem list She has History of endometrial cancer; Hx/o DVT/PE; Osteoporosis; Urethral prolapse; Essential hypertension; Mixed hyperlipidemia; Other abnormal glucose; Vitamin D deficiency; Essential tremor; Medication management;  HT CKD 3 (GFR 51ml/min); Medicare annual wellness visit, subsequent; and History of TIA (transient ischemic attack) on their problem list.  Review of Systems negative    Objective:   Physical Exam  Constitutional: She is oriented to person, place, and time. She appears well-developed and well-nourished.  HENT:  Head: Normocephalic and atraumatic.  Right Ear: External ear normal.  Left Ear: External ear normal.  Mouth/Throat: Oropharynx is clear and moist.   Eyes: Pupils are equal, round, and reactive to light. Conjunctivae and EOM are normal.  Neck: Normal range of motion. Neck supple. No thyromegaly present.  Cardiovascular: Normal rate, regular rhythm and normal heart sounds. Exam reveals no gallop and no friction rub.  No murmur heard. Pulmonary/Chest: Effort normal and breath sounds normal. No respiratory distress. She has no wheezes.  Abdominal: Soft. Bowel sounds are normal. She exhibits no distension and no mass. There is no tenderness. There is no rebound and no guarding.  Musculoskeletal: Normal range of motion.  Lymphadenopathy:    She has no cervical adenopathy.  Neurological: She is alert and oriented to person, place, and time. She displays normal reflexes. No cranial nerve deficit. Coordination normal.  Skin: Skin is warm and dry.  Psychiatric: She has a normal mood and affect.      Assessment & Plan:   Essential hypertension - continue medications, DASH diet, exercise and monitor at home. Call if greater than 130/80.   Essential tremor Follow up neuro  Future Appointments  Date Time Provider [place]  04/07/2018  2:45 PM Venancio Poisson, NP [facility] None  05/18/2018 10:00 AM [name], NP [facility] None  08/06/2018  9:30 AM [name], NP [facility] None  11/04/2018 10:30 AM Fontaine, Belinda Block, MD [facility] [name]

## 2018-03-03 ENCOUNTER — Telehealth: Payer: Self-pay | Admitting: Internal Medicine

## 2018-03-03 NOTE — Telephone Encounter (Signed)
Pt requesting a call back to figure out where her heart monitor result are. Pt spoke with [facility] but has been told that they don't know anything about it. Please call to advise

## 2018-03-03 NOTE — Telephone Encounter (Signed)
New message ° °Pt verbalized that she is returning call for RN °

## 2018-03-03 NOTE — Telephone Encounter (Signed)
Rn call patient that the heart monitor results are read by the cardiologist.Once he reads the results they are send to Dr. Audrea Muscat to review. Rn stated DR. Sethi  Orders the device, but our office does not monitor the results. Rn stated there is a phone call made to cardiology by her today,and wanting the results. Rn stated when the results are in we will give her a call.

## 2018-03-03 NOTE — Telephone Encounter (Signed)
Patient was inquiring about monitor results, which she returned 3/16...  Please advise, thank you

## 2018-03-16 NOTE — Telephone Encounter (Signed)
Rosalin Hawking, MD  Marval Regal, RN  Cc: Garvin Fila, MD; Fay Records, MD        Hi, Katrina:   Please let the pt know that her heart monitoring showed one episode of fasting heart beating on 02/15/18 around 2pm when she also felt dizziness at that time. Other than that, not remarkable. The monitoring did not show any concerning irregular heart beat related to her TIA. Please ask her to call her cardiologist Dr. Harrington Challenger to discuss about the fast heart beating episode with symptoms. Thank you.   Rosalin Hawking, MD PhD  Stroke Neurology  03/16/2018  1:43 PM

## 2018-03-16 NOTE — Telephone Encounter (Signed)
Notes recorded by Marval Regal, RN on 03/16/2018 at 3:40 PM EDT Pt returns nurse phone call. RN stated another MD Dr. Erlinda Hong look at the cardiac monitor. Rn stated the heart monitor showed one episode of fasting heart beat on 02/15/2018 around 2pm, where she felt dizzy. Otherwise it was remarkable.The cardiac monitor did not show concerning irregular heart beat related to her TIA. The MD wants you to call the cardiologist Dr. Harrington Challenger about the fast heart beat episode with symts. PT stated Dr. Harrington Challenger gave her a call when she wore the cardiac monitor,and had this episode. Pt knows its recommend to call Dr. Harrington Challenger if she wants to do anymore testing or schedule an appt if necessary. PT verbalized understanding. ------

## 2018-03-16 NOTE — Telephone Encounter (Signed)
Left vm for patient to call back about cardiac event monitor results. ------ 

## 2018-03-17 ENCOUNTER — Telehealth: Payer: Self-pay | Admitting: Internal Medicine

## 2018-03-17 NOTE — Telephone Encounter (Signed)
New message   Patient calling to ask if she needs to follow up with Dr Harrington Challenger soon, based on monitor results.  She has seen Neuro, but still does not have a clear understanding of results. Monica Curry states she needs additional testing. Patient requesting a call from nurse.

## 2018-03-17 NOTE — Telephone Encounter (Signed)
Patient was contacted on 4/8 by Katharine Look, RN (neurologist's office) and given the results from the 30 day cardiac event monitor...  She was told to contact Dr Harrington Challenger to find out what the next recommendations would be, pending these results..  Please review and advise.Marland Kitchen

## 2018-03-19 ENCOUNTER — Telehealth: Payer: Self-pay | Admitting: Internal Medicine

## 2018-03-19 NOTE — Telephone Encounter (Signed)
On review of monitor, short burst of tachycardia lasting 12 seconds   Not associated with

## 2018-03-21 NOTE — Telephone Encounter (Signed)
Pt had short burst of SVT that was associated with some dizziness I would recomm adding diltiazem 120 mg to her meds   Continue other meds  Follow BP and HR     F?U in clinic  In a few wks

## 2018-03-23 NOTE — Telephone Encounter (Signed)
Left message for patient to call back  

## 2018-03-24 ENCOUNTER — Telehealth: Payer: Self-pay | Admitting: Internal Medicine

## 2018-03-24 NOTE — Telephone Encounter (Signed)
Fay Records, MD  to Me    8:18 PM  Note    Pt had short burst of SVT that was associated with some dizziness I would recomm adding diltiazem 120 mg to her meds   Continue other meds  Follow BP and HR     F?U in clinic  In a few wks        Patient had event monitor ordered by neurology.  Wore Feb -Mar.  Pt was given results by neuro and then instructed to f/u with cardiology due to the results.    Speaking with patient today- her PCP increased inderal from 20mg  TID to 40 mg four times daily and since then BP has been controlled.    Pt has not experienced further episodes of dizziness, or presyncope like she had when monitor was ordered- other than the one episode of SVT.  I have scheduled f/u with APP for her 04/21/18.  No diltiazem sent in at this time, since meds have recently been adjusted for BP by PCP.   Will route to Dr. Harrington Challenger to make aware.

## 2018-03-24 NOTE — Telephone Encounter (Signed)
New message ° °Pt verbalized that she is returning call for RN °

## 2018-03-25 NOTE — Telephone Encounter (Signed)
Agree with plan 

## 2018-03-25 NOTE — Telephone Encounter (Signed)
Late entry from 03/24/18:   Patient had event monitor ordered by neurology.  Wore Feb -Mar.  Pt was given results by neuro and then instructed to f/u with cardiology due to the results.    Speaking with patient today- her PCP increased inderal from 20mg  TID to 40 mg four times daily and since then BP has been controlled.    Pt has not experienced further episodes of dizziness, or presyncope like she had when monitor was ordered- other than the one episode of SVT.  I have scheduled f/u with APP for her 04/21/18.  No diltiazem sent in at this time, since meds have recently been adjusted for BP by PCP.   Will route to Dr. Harrington Challenger to make aware.

## 2018-04-07 ENCOUNTER — Ambulatory Visit: Payer: Medicare Other | Admitting: Adult Health

## 2018-04-07 ENCOUNTER — Encounter: Payer: Self-pay | Admitting: Adult Health

## 2018-04-07 VITALS — BP 132/72 | HR 78 | Ht 64.75 in | Wt 150.0 lb

## 2018-04-07 DIAGNOSIS — I10 Essential (primary) hypertension: Secondary | ICD-10-CM | POA: Diagnosis not present

## 2018-04-07 DIAGNOSIS — E782 Mixed hyperlipidemia: Secondary | ICD-10-CM

## 2018-04-07 DIAGNOSIS — G459 Transient cerebral ischemic attack, unspecified: Secondary | ICD-10-CM | POA: Diagnosis not present

## 2018-04-07 NOTE — Progress Notes (Signed)
[facility] [address] [place]. [place] [address] [phone]       OFFICE FOLLOW-UP NOTE  [name] Date of Birth:  1931-05-30 Medical Record Number:  [id number]   HPI: Ms. Pierce Crane is a 84 year old pleasant Caucasian lady seen today for first office follow-up visit following hospital admission for TIA in November 2018. History is obtained from the patient and review of electronic medical records. I personally reviewed the imaging films. Reise Hietala is a 84 y.o. female who has a history of essential tremor currently on propranolol and Klonopin, postoperative DVT and PE in 1994, presented to the emergency room at [facility] when she had sudden onset of what she described as confusion, word finding difficulty and slurred speech when she was with her stepdaughter. At baseline, she lives by herself ever since her husband died 79 years ago.  She is able to take care of all her ADLs and finances by herself.She denied any similar episodes in the past. Of note, on arrival to the [facility] emergency department, her systolic blood pressure was 213/90.  She does not have a history of hypertension.  She has been on propranolol and Klonopin for her essential tremor which is managed by an outpatient neurologist Dr. Erling Cruz at [facility] -she has been taking these medications for years. She was on anticoagulation for many years after 1994 for her DVT/PE.  She was put on Coumadin at that time.  She discontinued Coumadin many years after and was put on full dose aspirin for many subsequent years.  Following which, she is now on baby aspirin. CT scan of the head and MRI scan the brain showed no acute abnormality. MRA of the brain showed incidental 5 x 5 mm left supraclinoid ICA and 3 x 2 mm right supraclinoid ICA aneurysms. Carotid ultrasound showed no significant siphon stenosis. Lower extremity dopplers are negative for DVT. Transthoracic echo showed normal ejection  fraction. LDL cholesterol was elevated at 105 mg percent. Hemoglobin A1c was 5.5. Patient was placed on aspirin for stroke prevention and on Lipitor.  01/08/18 visit (Dr. Leonie Man): She states she's done well since discharge is starting aspirin without minor bruising but no bleeding. Her blood pressure normally is well controlled at home but today it is slightly elevated in office at 158/84 and she blames this on anxiety. She does have a mild upper extremity tremors which are long-standing. She was on primidone in the past and more recently she's been on propranolol and doing well. She complains of intermittent transient episodes in which she feels dizzy sense of tightness in the throat as well as chest. She feels her heart is stopping it and she nearly faints and blacks out but does not pass out completely. These episodes of previously quite infrequent once every 1-2 months. She did have a cardiac evaluation for this years ago including 71-week-old to monitor which were negative. Since her recent TIA she's been having more episodes now occurring once or twice a month. She does have an appointment to see a cardiologist next month. She denies significant increase in anxiety though she does take Klonopin every day.  Update 04/07/18: Patient returns today for follow-up visit.  Overall she is doing well.  She did have recent EEG monitoring which was negative for seizure activity.  Did have cardiac monitor which did show tachycardia during symptomatic dizziness.  Denies recent episodes of dizziness and feeling of passing out.  It was advised for patient to follow-up with Dr.  Harrington Challenger (cardiology) for which she has an appointment on 04/21/2018.  She does complain of right hand pain but only after doing certain activities such as weeding but this does resolve.  Continues to take aspirin without side effects of bleeding or bruising.  Continues to take Lipitor without side effects of myalgias.  Patient questioning whether she needs  to continue Lipitor as her PCP stated her lipid panel was within normal limits.Advised patient that even though total cholesterol  170, for stroke prevention we look at LDL which is at 103 on 02/12/2018 and this needs to be less than 70.  Patient was irritated with this answer it was as she was told LDL should be less than 100 by her PCP. It was further explained to her that it needs to be less than 70 after having a stroke or TIA.  Patient states she will have lipid panel rechecked in June and her and her PCP will decide if she needs to continue Lipitor.  Blood pressure today satisfactory 132/72.  Denies new or worsening stroke/TIA symptoms.   ROS:   14 system review of systems is positive for weight gain, weakness and tremors and all other systems negative  PMH:  Past Medical History:  Diagnosis Date  . C. difficile colitis   . Cancer [facility])    Endometrial cancer  . Essential and other specified forms of tremor   . Osteoporosis 11/2017   T score -2.7 stable from prior DEXA 2016  . Personal history of venous thrombosis and embolism 1997  . Stroke ([place])   . Thrush   . TIA (transient ischemic attack)     Social History:  Social History   Socioeconomic History  . Marital status: Widowed    Spouse name: Not on file  . Number of children: Not on file  . Years of education: Not on file  . Highest education level: Not on file  Occupational History  . Not on file  Social Needs  . Financial resource strain: Not on file  . Food insecurity:    Worry: Not on file    Inability: Not on file  . Transportation needs:    Medical: Not on file    Non-medical: Not on file  Tobacco Use  . Smoking status: Never Smoker  . Smokeless tobacco: Never Used  Substance and Sexual Activity  . Alcohol use: Yes    Alcohol/week: 0.0 oz    Comment: ocassionally  . Drug use: No  . Sexual activity: Never    Birth control/protection: Post-menopausal, Surgical    Comment: HYST-1st intercourse 84 yo-Fewer  than 5 partners  Lifestyle  . Physical activity:    Days per week: Not on file    Minutes per session: Not on file  . Stress: Not on file  Relationships  . Social connections:    Talks on phone: Not on file    Gets together: Not on file    Attends religious service: Not on file    Active member of club or organization: Not on file    Attends meetings of clubs or organizations: Not on file    Relationship status: Not on file  . Intimate partner violence:    Fear of current or ex partner: Not on file    Emotionally abused: Not on file    Physically abused: Not on file    Forced sexual activity: Not on file  Other Topics Concern  . Not on file  Social History Narrative  . Not  on file    Medications:   Current Outpatient Medications on File Prior to Visit  Medication Sig Dispense Refill  . aspirin EC 325 MG EC tablet Take 1 tablet (325 mg total) by mouth daily.    Marland Kitchen atorvastatin (LIPITOR) 20 MG tablet Take 1 tablet daily for Cholesterol 90 tablet 1  . Calcium-Magnesium-Vitamin D 300-20-200 MG-MG-UNIT CHEW Chew 1 tablet daily by mouth.     . Cholecalciferol (VITAMIN D3) 5000 units TABS Take 1 tablet daily by mouth.    . clonazePAM (KLONOPIN) 0.5 MG tablet Take 1 pill in AM, 1/2 pill at noon and 1/2 pill at night. 60 tablet 5  . propranolol (INDERAL) 40 MG tablet Take 1 tablet 4 x / day with meals & Bedtime for BP & Tremor 360 tablet 1   No current facility-administered medications on file prior to visit.     Allergies:   Allergies  Allergen Reactions  . Codeine Hives  . Meloxicam Nausea Only    Gastritis and nausea  . Mysoline [Primidone]   . Pneumovax 23 [Pneumococcal Vac Polyvalent]     Red, raised area.  Knot at site of injection  . Toradol [Ketorolac Tromethamine] Swelling    Localized redness at injection site.      Physical Exam General: well developed, well nourished, seated, in no evident distress Head: head normocephalic and atraumatic.  Neck: supple with no  carotid or supraclavicular bruits Cardiovascular: regular rate and rhythm, no murmurs Musculoskeletal: no deformity Skin:  no rash/petichiae Vascular:  Normal pulses all extremities Vitals:   04/07/18 1457  BP: 132/72  Pulse: 78   Neurologic Exam Mental Status: Awake and fully alert. Oriented to place and time. Recent and remote memory intact. Attention span, concentration and fund of knowledge appropriate. Mood and affect appropriate.  Cranial Nerves: Fundoscopic exam reveals sharp disc margins. Pupils equal, briskly reactive to light. Extraocular movements full without nystagmus. Visual fields full to confrontation. Hearing intact. Facial sensation intact. Face, tongue, palate moves normally and symmetrically.  Motor: Normal bulk and tone. Normal strength in all tested extremity muscles.fine action tremor of outstretched upper extremities right greater than left. No cogwheel rigidity observed. Sensory.: intact to touch ,pinprick .position and vibratory sensation.  Coordination: Rapid alternating movements normal in all extremities. Finger-to-nose and heel-to-shin performed accurately bilaterally. Gait and Station: Arises from chair without difficulty. Stance is normal. Gait demonstrates normal stride length and balance . Able to heel, toe and tandem walk with moderatet difficulty.  Reflexes: 1+ and symmetric. Toes downgoing.     ASSESSMENT: 64 year lady with transient episode of expressive aphasia due to left hemispheric TIA in November 2018.Vascular risk factors of hyperlipidemia and age. Recurrent stereotypic episodes of near passing out of unclear etiology anxiety/panic versus arrhythmias. Complex partial seizure less likely.Long standing fine action tremor of upper extremity is likely anxiety related with good response to propranolol.  Returns today for follow-up TIA visit.   PLAN: Continue aspirin 325 mg daily  and lipitor  for secondary stroke prevention -follow up with  cardiology -tylenol for pain -Maintain strict control of hypertension with blood pressure goal below 130/90, diabetes with hemoglobin A1c goal below 6.5% and cholesterol with LDL cholesterol (bad cholesterol) goal below 70 mg/dL. I also advised the patient to eat a healthy diet with plenty of whole grains, cereals, fruits and vegetables, exercise regularly and maintain ideal body weight.  Followup in the future with Jinny Blossom, NP for tremors. Does not need follow up appointment for TIA  Greater  than 50% time during this 25 minute consultation visit was spent on counseling and coordination of care about HLD, and HTN (risk factors), discussion about risk benefit of anticoagulation and answering questions.   Venancio Poisson, AGNP-BC  [facility] [address] [place], [place] [address]  Phone [phone] Fax [phone]

## 2018-04-07 NOTE — Progress Notes (Signed)
I agree with the above plan 

## 2018-04-07 NOTE — Patient Instructions (Addendum)
Continue aspirin 325 mg daily  and lipitor  for secondary stroke prevention  Follow up appointment with cardiology   Okay to take tylenol regarding hand pain - continue to monitor  Continue to stay active and eat healthy  Maintain strict control of hypertension with blood pressure goal below 130/90, diabetes with hemoglobin A1c goal below 6.5% and cholesterol with LDL cholesterol (bad cholesterol) goal below 70 mg/dL. I also advised the patient to eat a healthy diet with plenty of whole grains, cereals, fruits and vegetables, exercise regularly and maintain ideal body weight.  Followup in the future with Jinny Blossom, NP

## 2018-04-21 ENCOUNTER — Ambulatory Visit (HOSPITAL_COMMUNITY)
Admission: RE | Admit: 2018-04-21 | Discharge: 2018-04-21 | Disposition: A | Discharge status: Home / Self Care | Payer: Medicare Other | Source: Ambulatory Visit | Attending: Cardiovascular Disease | Admitting: Cardiovascular Disease

## 2018-04-21 ENCOUNTER — Ambulatory Visit: Payer: Medicare Other | Admitting: Physician Assistant

## 2018-04-21 ENCOUNTER — Encounter: Payer: Self-pay | Admitting: Physician Assistant

## 2018-04-21 VITALS — BP 134/82 | HR 62 | Ht 64.75 in | Wt 152.0 lb

## 2018-04-21 DIAGNOSIS — Z86718 Personal history of other venous thrombosis and embolism: Secondary | ICD-10-CM | POA: Diagnosis not present

## 2018-04-21 DIAGNOSIS — R079 Chest pain, unspecified: Secondary | ICD-10-CM | POA: Insufficient documentation

## 2018-04-21 DIAGNOSIS — R55 Syncope and collapse: Secondary | ICD-10-CM

## 2018-04-21 DIAGNOSIS — I10 Essential (primary) hypertension: Secondary | ICD-10-CM

## 2018-04-21 DIAGNOSIS — M7989 Other specified soft tissue disorders: Secondary | ICD-10-CM

## 2018-04-21 LAB — D-DIMER, QUANTITATIVE (NOT AT ARMC): D-DIMER: 0.23 mg{FEU}/L (ref 0.00–0.49)

## 2018-04-21 LAB — TROPONIN T

## 2018-04-21 NOTE — Patient Instructions (Addendum)
Medication Instructions:  Your physician recommends that you continue on your current medications as directed. Please refer to the Current Medication list given to you today.  Labwork: Your physician recommends that you have STAT lab work today- D-Dimer and Troponin  Testing/Procedures: Your physician has requested that you have a lexiscan myoview. For further information please visit [email or web address]. Please follow instruction sheet, as given.  Your physician has requested that you have a lower left venous Today (ASAP). This test is an ultrasound of the veins in the legs or arms. It looks at venous blood flow that carries blood from the heart to the legs or arms. Allow one hour for a Lower Venous exam. Allow thirty minutes for an Upper Venous exam. There are no restrictions or special instructions.  Follow-Up: Your physician wants you to follow-up in: after test are complete with Dr. Harrington Challenger or Ermalinda Barrios PA.   Your physician wants you to go to the Emergency Room is your chest pain comes back.   If you need a refill on your cardiac medications before your next appointment, please call your pharmacy.

## 2018-04-21 NOTE — Progress Notes (Signed)
Cardiology Office Note    Date:  12/14/2692   ID:  Monica Curry, DOB [birth year], MRN [id number]  PCP:  Unk Pinto, MD  Cardiologist: Dorris Carnes, MD  No chief complaint on file.   History of Present Illness:  Monica Curry is a 84 y.o. female who saw Dr. Harrington Challenger 02/05/2018 for evaluation of presyncope once while driving and the other while having her teeth cleaned at the dentist.  Patient had a TIA in November 2018 and has residual expressive a aphasia.  Blood pressure was high in the office and patient was set up for an event monitor.  This has showed SVT and she was placed on diltiazem 120 mg daily. 2D echo 10/2017 showed normal LVEF 55 to 60% with grade 1 DD, moderately dilated left atrium no source of emboli identified.  Patient comes in today for follow-up.  She just got back from [place] at 2 AM after going to see her granddaughter graduate.  She was eating out a lot and on her feet a lot.  She woke up this morning her left leg is swollen.  No pain or erythema.  She also complained of dyspnea on exertion while walking upgrade which she has not had before.  She is had 2 episodes of palpitations and dizziness that were short-lived since her last monitor.  She never started the diltiazem because Inderal was increased.  She prefers to stay on the Inderal because she takes this for essential tremors as well.  As I was leaving the room she tells me her chest has been tight since this morning.  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  She had a stress test many years ago that is not in the system.   Past Medical History:  Diagnosis Date  . C. difficile colitis   . Cancer [facility])    Endometrial cancer  . Essential and other specified forms of tremor   . Osteoporosis 11/2017   T score -2.7 stable from prior DEXA 2016  . Personal history of venous thrombosis and embolism 1997  . Stroke ([place])   . Thrush   . TIA (transient ischemic  attack)     Past Surgical History:  Procedure Laterality Date  . ABDOMINAL HYSTERECTOMY  1997  . APPENDECTOMY    . [place]  . OOPHORECTOMY     BSO  . TONSILLECTOMY AND ADENOIDECTOMY      Current Medications: Current Meds  Medication Sig  . aspirin EC 325 MG EC tablet Take 1 tablet (325 mg total) by mouth daily.  Marland Kitchen atorvastatin (LIPITOR) 20 MG tablet Take 1 tablet daily for Cholesterol  . Calcium-Magnesium-Vitamin D 300-20-200 MG-MG-UNIT CHEW Chew 1 tablet daily by mouth.   . Cholecalciferol (VITAMIN D3) 5000 units TABS Take 1 tablet daily by mouth.  . clonazePAM (KLONOPIN) 0.5 MG tablet Take 1 pill in AM, 1/2 pill at noon and 1/2 pill at night.  . propranolol (INDERAL) 40 MG tablet Take 1 tablet 4 x / day with meals & Bedtime for BP & Tremor     Allergies:   Codeine; Meloxicam; Mysoline [primidone]; Pneumovax 23 [pneumococcal vac polyvalent]; and Toradol [ketorolac tromethamine]   Social History   Socioeconomic History  . Marital status: Widowed    Spouse name: Not on file  . Number of children: Not on file  . Years of education: Not on file  . Highest education level: Not on file  Occupational History  .  Not on file  Social Needs  . Financial resource strain: Not on file  . Food insecurity:    Worry: Not on file    Inability: Not on file  . Transportation needs:    Medical: Not on file    Non-medical: Not on file  Tobacco Use  . Smoking status: Never Smoker  . Smokeless tobacco: Never Used  Substance and Sexual Activity  . Alcohol use: Yes    Alcohol/week: 0.0 oz    Comment: ocassionally  . Drug use: No  . Sexual activity: Never    Birth control/protection: Post-menopausal, Surgical    Comment: HYST-1st intercourse 84 yo-Fewer than 5 partners  Lifestyle  . Physical activity:    Days per week: Not on file    Minutes per session: Not on file  . Stress: Not on file  Relationships  . Social connections:    Talks on phone: Not on file    Gets  together: Not on file    Attends religious service: Not on file    Active member of club or organization: Not on file    Attends meetings of clubs or organizations: Not on file    Relationship status: Not on file  Other Topics Concern  . Not on file  Social History Narrative  . Not on file     Family History:  The patient's family history includes Cancer in her brother, brother, father, and mother; Diabetes in her sister; Heart disease in her brother, brother, sister, and sister; Kidney failure in her brother and sister.   ROS:   Please see the history of present illness.    Review of Systems  Constitution: Negative.  HENT: Negative.   Eyes: Negative.   Cardiovascular: Positive for chest pain, dyspnea on exertion and leg swelling.  Respiratory: Negative.   Hematologic/Lymphatic: Negative.   Musculoskeletal: Negative.  Negative for joint pain.  Gastrointestinal: Negative.   Genitourinary: Negative.   Neurological: Negative.    All other systems reviewed and are negative.   PHYSICAL EXAM:   VS:  BP 134/82 (BP Location: Right Arm, Patient Position: Sitting, Cuff Size: Normal)   Pulse 62   Ht 5' 4.75" (1.645 m)   Wt 152 lb (68.9 kg)   SpO2 98%   BMI 25.49 kg/m   Physical Exam  GEN: Well nourished, well developed, in no acute distress  Neck: no JVD, carotid bruits, or masses Cardiac:RRR; no murmurs, rubs, or gallops  Respiratory:  clear to auscultation bilaterally, normal work of breathing GI: soft, nontender, nondistended, + BS Ext: Left leg 2+ edema, no erythema or pain without cyanosis, clubbing, or edema, Good distal pulses bilaterally Neuro:  Alert and Oriented x 3 Psych: euthymic mood, full affect  Wt Readings from Last 3 Encounters:  04/21/18 152 lb (68.9 kg)  04/07/18 150 lb (68 kg)  02/26/18 151 lb (68.5 kg)      Studies/Labs Reviewed:   EKG:  EKG is ordered today.  The ekg ordered today demonstrates nice bradycardia at 54 bpm with right bundle branch  block, unchanged from EKG 02/05/2018  Recent Labs: 02/12/2018: ALT 9; BUN 20; Creat 1.21; Hemoglobin 14.3; Magnesium 1.9; Platelets 254; Potassium 4.6; Sodium 139; TSH 1.66   Lipid Panel    Component Value Date/Time   CHOL 170 02/12/2018 1652   TRIG 152 (H) 02/12/2018 1652   HDL 42 (L) 02/12/2018 1652   CHOLHDL 4.0 02/12/2018 1652   VLDL 24 10/24/2017 0629   LDLCALC 103 (H) 02/12/2018 1652  Additional studies/ records that were reviewed today include:  2D echo 11/2018Study Conclusions   - Left ventricle: The cavity size was normal. Systolic function was   normal. The estimated ejection fraction was in the range of 55%   to 60%. Wall motion was normal; there were no regional wall   motion abnormalities. Doppler parameters are consistent with   abnormal left ventricular relaxation (grade 1 diastolic   dysfunction). - Mitral valve: There was mild regurgitation. - Left atrium: The atrium was moderately dilated.   Impressions:   - No cardiac source of emboli was indentified.       ASSESSMENT:    1. Pre-syncope   2. Essential hypertension   3. Chest pain, unspecified type   4. Hx/o DVT/PE   5. Leg swelling      PLAN:  In order of problems listed above:  Pre-syncope-SVT noted on the monitor-patient prefers to stay on Inderal 40 mg 4 times a day because of her essential tremors.  Her dizziness and presyncope has decreased.  I told her we could decrease the Inderal and add diltiazem if necessary.  Follow up with myself or Dr. Harrington Challenger after stress test.  Chest pain patient thinks is secondary to fatigue from a long night of travel and then volunteering this morning.  EKG is unchanged.  Recommend she come to the hospital for observation but she has declined.  Will order a stat troponin and d-dimer here.  Told her she needs to go to the emergency room if she is not feeling any better later today.  Left leg swelling with history of PE after hysterectomy.  No pain or erythema.   Checking d-dimer and will check lower extremity Doppler to rule out DVT.  Suspect is most likely secondary to eating out, excessive salt, walking extensively and DC and airplane travel.  Essential hypertension blood pressure stable     Medication Adjustments/Labs and Tests Ordered: Current medicines are reviewed at length with the patient today.  Concerns regarding medicines are outlined above.  Medication changes, Labs and Tests ordered today are listed in the Patient Instructions below. Patient Instructions  Medication Instructions:  Your physician recommends that you continue on your current medications as directed. Please refer to the Current Medication list given to you today.  Labwork: NONE  Testing/Procedures: NONE  Follow-Up: Your physician wants you to follow-up in: 6 months with Dr. Harrington Challenger. You will receive a reminder letter in the mail two months in advance. If you don't receive a letter, please call our office to schedule the follow-up appointment.   If you need a refill on your cardiac medications before your next appointment, please call your pharmacy.       Sumner Boast, PA-C  04/21/2018 1:14 PM    [facility] [place], [place], [place]  [address] Phone: [phone]; Fax: [phone]

## 2018-04-22 ENCOUNTER — Telehealth (HOSPITAL_COMMUNITY): Payer: Self-pay

## 2018-04-22 NOTE — Telephone Encounter (Signed)
Pt contacted for her instructions for her Nuclear Stress Study. She stated she understood the instructions and will be here. S.Chamille Werntz EMTP

## 2018-04-22 NOTE — Telephone Encounter (Signed)
Patient called.

## 2018-04-23 ENCOUNTER — Ambulatory Visit (HOSPITAL_COMMUNITY): Payer: Medicare Other | Attending: Cardiovascular Disease

## 2018-04-23 DIAGNOSIS — R079 Chest pain, unspecified: Secondary | ICD-10-CM | POA: Insufficient documentation

## 2018-04-23 DIAGNOSIS — Z86718 Personal history of other venous thrombosis and embolism: Secondary | ICD-10-CM | POA: Diagnosis not present

## 2018-04-23 DIAGNOSIS — M7989 Other specified soft tissue disorders: Secondary | ICD-10-CM | POA: Diagnosis not present

## 2018-04-23 DIAGNOSIS — I10 Essential (primary) hypertension: Secondary | ICD-10-CM | POA: Diagnosis not present

## 2018-04-23 DIAGNOSIS — R55 Syncope and collapse: Secondary | ICD-10-CM | POA: Diagnosis present

## 2018-04-23 LAB — MYOCARDIAL PERFUSION IMAGING
CHL CUP NUCLEAR SDS: 3
CHL CUP NUCLEAR SRS: 8
CHL CUP RESTING HR STRESS: 55 {beats}/min
LHR: 0.28
LV dias vol: 62 mL (ref 46–106)
LVSYSVOL: 15 mL
Peak HR: 88 {beats}/min
SSS: 8
TID: 1.02

## 2018-04-23 MED ORDER — TECHNETIUM TC 99M TETROFOSMIN IV KIT
11.0000 | PACK | Freq: Once | INTRAVENOUS | Status: AC | PRN
  Administered 2018-04-23: 11 via INTRAVENOUS
  Filled 2018-04-23: qty 11

## 2018-04-23 MED ORDER — REGADENOSON 0.4 MG/5ML IV SOLN
0.4000 mg | Freq: Once | INTRAVENOUS | Status: AC
  Administered 2018-04-23: 0.4 mg via INTRAVENOUS

## 2018-04-23 MED ORDER — TECHNETIUM TC 99M TETROFOSMIN IV KIT
31.6000 | PACK | Freq: Once | INTRAVENOUS | Status: AC | PRN
  Administered 2018-04-23: 31.6 via INTRAVENOUS
  Filled 2018-04-23: qty 32

## 2018-04-24 ENCOUNTER — Telehealth: Payer: Self-pay

## 2018-04-24 NOTE — Telephone Encounter (Signed)
lmtcb for stress test results

## 2018-04-24 NOTE — Telephone Encounter (Signed)
-----   Message from Imogene Burn, PA-C sent at 04/23/2018  6:06 PM EDT ----- Normal nuclear stress test

## 2018-04-24 NOTE — Telephone Encounter (Signed)
Follow Up:   Returning your call,concerning Stress Test results.

## 2018-04-27 NOTE — Telephone Encounter (Signed)
Pt is aware and agreeable to stress test results.

## 2018-05-06 ENCOUNTER — Encounter: Payer: Self-pay | Admitting: Physician Assistant

## 2018-05-06 ENCOUNTER — Ambulatory Visit: Payer: Medicare Other | Admitting: Physician Assistant

## 2018-05-06 VITALS — BP 120/88 | HR 70 | Ht 64.0 in | Wt 149.1 lb

## 2018-05-06 DIAGNOSIS — E782 Mixed hyperlipidemia: Secondary | ICD-10-CM | POA: Diagnosis not present

## 2018-05-06 DIAGNOSIS — Z86718 Personal history of other venous thrombosis and embolism: Secondary | ICD-10-CM

## 2018-05-06 DIAGNOSIS — M7989 Other specified soft tissue disorders: Secondary | ICD-10-CM

## 2018-05-06 DIAGNOSIS — R079 Chest pain, unspecified: Secondary | ICD-10-CM

## 2018-05-06 DIAGNOSIS — I10 Essential (primary) hypertension: Secondary | ICD-10-CM | POA: Diagnosis not present

## 2018-05-06 DIAGNOSIS — R55 Syncope and collapse: Secondary | ICD-10-CM | POA: Diagnosis not present

## 2018-05-06 NOTE — Progress Notes (Signed)
Cardiology Office Note    Date:  08/24/3845   ID:  Monica Curry, DOB [birth year], MRN [id number]  PCP:  Unk Pinto, MD  Cardiologist: Dorris Carnes, MD  Chief Complaint  Patient presents with  . Follow-up    History of Present Illness:  Monica Curry is a 84 y.o. female who saw Dr. Harrington Challenger 02/05/2018 for evaluation of presyncope once while driving and the other while having her teeth cleaned at the dentist.  Patient had a TIA in November 2018 and has residual expressive a aphasia.  Blood pressure was high in the office and patient was set up for an event monitor.  This has showed SVT and she was placed on diltiazem 120 mg daily. 2D echo 10/2017 showed normal LVEF 55 to 60% with grade 1 DD, moderately dilated left atrium no source of emboli identified.  I saw the patient 04/21/2018.  Patient wanted to stay on the increased Inderal 40 mg 4 times daily because it helps her essential tremors.  She did not want to try diltiazem.  She also had some chest pain that she thought was secondary to fatigue after a long night of travel and then volunteering at [facility].  Stat troponin and d-dimer were normal.  She was having left leg swelling and with her history of PE Doppler was ordered and was negative for DVT.  Nuclear stress test LVEF 76% normal study hyperdynamic LV EF 65%.   Patient comes in for f/u. She complains of dyspnea on exertion which seems more pronounced since Inderal increased. Just has to move slower. No further chest pain or edema. BP better controlled.Overall feeling well. Her brother who is 15 just stopped dialysis and they are waiting for him to pass.   Past Medical History:  Diagnosis Date  . C. difficile colitis   . Cancer [facility]    Endometrial cancer  . Essential and other specified forms of tremor   . Osteoporosis 11/2017   T score -2.7 stable from prior DEXA 2016  . Personal history of venous thrombosis and embolism 1997  . Stroke ([place])   . Thrush   . TIA  (transient ischemic attack)     Past Surgical History:  Procedure Laterality Date  . ABDOMINAL HYSTERECTOMY  1997  . APPENDECTOMY    . [place]  . OOPHORECTOMY     BSO  . TONSILLECTOMY AND ADENOIDECTOMY      Current Medications: Current Meds  Medication Sig  . aspirin EC 325 MG EC tablet Take 1 tablet (325 mg total) by mouth daily.  Marland Kitchen atorvastatin (LIPITOR) 20 MG tablet Take 1 tablet daily for Cholesterol  . Calcium-Magnesium-Vitamin D 300-20-200 MG-MG-UNIT CHEW Chew 1 tablet daily by mouth.   . Cholecalciferol (VITAMIN D3) 5000 units TABS Take 1 tablet daily by mouth.  . clonazePAM (KLONOPIN) 0.5 MG tablet Take 1 pill in AM, 1/2 pill at noon and 1/2 pill at night.  . propranolol (INDERAL) 40 MG tablet Take 1 tablet 4 x / day with meals & Bedtime for BP & Tremor     Allergies:   Codeine; Meloxicam; Mysoline [primidone]; Pneumovax 23 [pneumococcal vac polyvalent]; and Toradol [ketorolac tromethamine]   Social History   Socioeconomic History  . Marital status: Widowed    Spouse name: Not on file  . Number of children: Not on file  . Years of education: Not on file  . Highest education level: Not on file  Occupational History  . Not on file  Social  Needs  . Financial resource strain: Not on file  . Food insecurity:    Worry: Not on file    Inability: Not on file  . Transportation needs:    Medical: Not on file    Non-medical: Not on file  Tobacco Use  . Smoking status: Never Smoker  . Smokeless tobacco: Never Used  Substance and Sexual Activity  . Alcohol use: Yes    Alcohol/week: 0.0 oz    Comment: ocassionally  . Drug use: No  . Sexual activity: Never    Birth control/protection: Post-menopausal, Surgical    Comment: HYST-1st intercourse 84 yo-Fewer than 5 partners  Lifestyle  . Physical activity:    Days per week: Not on file    Minutes per session: Not on file  . Stress: Not on file  Relationships  . Social connections:    Talks on phone:  Not on file    Gets together: Not on file    Attends religious service: Not on file    Active member of club or organization: Not on file    Attends meetings of clubs or organizations: Not on file    Relationship status: Not on file  Other Topics Concern  . Not on file  Social History Narrative  . Not on file     Family History:  The patient's family history includes Cancer in her brother, brother, father, and mother; Diabetes in her sister; Heart disease in her brother, brother, sister, and sister; Kidney failure in her brother and sister.   ROS:   Please see the history of present illness.    Review of Systems  Constitution: Negative.  HENT: Negative.   Eyes: Negative.   Cardiovascular: Positive for dyspnea on exertion and leg swelling.  Respiratory: Negative.   Hematologic/Lymphatic: Negative.   Musculoskeletal: Negative.  Negative for joint pain.  Gastrointestinal: Negative.   Genitourinary: Negative.   Neurological: Negative.    All other systems reviewed and are negative.   PHYSICAL EXAM:   VS:  BP 120/88   Pulse 70   Ht 5\' 4"  (1.626 m)   Wt 149 lb 1.9 oz (67.6 kg)   SpO2 98%   BMI 25.60 kg/m   Physical Exam  GEN: Well nourished, well developed, in no acute distress  Neck: no JVD, carotid bruits, or masses Cardiac:RRR; no murmurs, rubs, or gallops  Respiratory:  clear to auscultation bilaterally, normal work of breathing GI: soft, nontender, nondistended, + BS Ext: without cyanosis, clubbing, or edema, Good distal pulses bilaterally Neuro:  Alert and Oriented x 3 Psych: euthymic mood, full affect  Wt Readings from Last 3 Encounters:  05/06/18 149 lb 1.9 oz (67.6 kg)  04/23/18 152 lb (68.9 kg)  04/21/18 152 lb (68.9 kg)      Studies/Labs Reviewed:   EKG:  EKG is not ordered today.   Recent Labs: 02/12/2018: ALT 9; BUN 20; Creat 1.21; Hemoglobin 14.3; Magnesium 1.9; Platelets 254; Potassium 4.6; Sodium 139; TSH 1.66   Lipid Panel    Component Value  Date/Time   CHOL 170 02/12/2018 1652   TRIG 152 (H) 02/12/2018 1652   HDL 42 (L) 02/12/2018 1652   CHOLHDL 4.0 02/12/2018 1652   VLDL 24 10/24/2017 0629   LDLCALC 103 (H) 02/12/2018 1652    Additional studies/ records that were reviewed today include:  Nuclear stress test 04/23/2018 Study Highlights      Nuclear stress EF: 76%.  There was no ST segment deviation noted during stress.  No T  wave inversion was noted during stress.  The study is normal.  This is a low risk study.  The left ventricular ejection fraction is hyperdynamic (>65%).   Low risk stress nuclear study with normal perfusion and normal left ventricular regional and global systolic function.      Lower extremity venous Doppler 04/21/2018 Final Interpretation: Right: No evidence of common femoral vein obstruction. Normal reflux times were noted in the common femoral vein.  Left: No evidence of deep vein thrombosis in the lower extremity. No indirect evidence of obstruction proximal to the inguinal ligament.  Probable Baker's cyst, measuring .99 x .53 x 1.0 cm.     2D echo 10/24/2017 Study Conclusions   - Left ventricle: The cavity size was normal. Systolic function was   normal. The estimated ejection fraction was in the range of 55%   to 60%. Wall motion was normal; there were no regional wall   motion abnormalities. Doppler parameters are consistent with   abnormal left ventricular relaxation (grade 1 diastolic   dysfunction). - Mitral valve: There was mild regurgitation. - Left atrium: The atrium was moderately dilated.   Impressions:   - No cardiac source of emboli was indentified.     ASSESSMENT:    1. Pre-syncope   2. Essential hypertension   3. Chest pain, unspecified type   4. Leg swelling   5. Mixed hyperlipidemia   6. Hx/o DVT/PE      PLAN:  In order of problems listed above:  Presyncope with history of SVT patient wants to stay on Inderal 40 mg 4 times daily because of  essential tremors.  No recent palpitations but feeling she can get her heart rate up when she is walking.  Continue current dose and hold off on diltiazem.  Follow-up with Dr. Harrington Challenger in 6 months.  Essential hypertension blood pressure well controlled  Chest pain after long day travel last office visit.  Exercise Myoview was normal.  No further chest pain  Leg swelling after travel resolved, Doppler negative for DVT, does have a Baker's cyst on the left.  Trace of ankle edema on occasion.  Hyperlipidemia on low-dose Lipitor last LDL was 103  History of DVT and PE after hysterectomy on aspirin    Medication Adjustments/Labs and Tests Ordered: Current medicines are reviewed at length with the patient today.  Concerns regarding medicines are outlined above.  Medication changes, Labs and Tests ordered today are listed in the Patient Instructions below. There are no Patient Instructions on file for this visit.   Sumner Boast, PA-C  05/06/2018 12:28 PM    [facility] [place], [place], [place]  [address] Phone: [phone]; Fax: [phone]

## 2018-05-06 NOTE — Patient Instructions (Signed)
Medication Instructions:  Your physician recommends that you continue on your current medications as directed. Please refer to the Current Medication list given to you today.  Labwork: None  Testing/Procedures: None  Follow-Up: Your physician wants you to follow-up in: 6 months with Dr. Ross.  You will receive a reminder letter in the mail two months in advance. If you don't receive a letter, please call our office to schedule the follow-up appointment.    Any Other Special Instructions Will Be Listed Below (If Applicable).     If you need a refill on your cardiac medications before your next appointment, please call your pharmacy.   

## 2018-05-07 ENCOUNTER — Encounter: Payer: Self-pay | Admitting: Physician Assistant

## 2018-05-17 NOTE — Progress Notes (Signed)
Complete Physical  Assessment and Plan:  Routine general exam with abnormal findings  Essential hypertension At goal; continue medication Monitor blood pressure at home; call if consistently over 130/80 Continue DASH diet.   Reminder to go to the ER if any CP, SOB, nausea, dizziness, severe HA, changes vision/speech, left arm numbness and tingling and jaw pain.  History of TIA (transient ischemic attack) Continue ASA; follow up with neurology Control blood pressure, cholesterol, glucose, increase exercise.   Essential tremor  Continue follow up with neuro, stable with inderal, clonazepam PRN  Osteoporosis, unspecified osteoporosis type, unspecified pathological fracture presence Curently on drug break with hx of bisphosphonate tx; recent DEXA shows stable T score; continue follow up with Dr. Soundra Pilon Continue vitamin D supplementation, recommend weight bearing exercises  History of endometrial cancer S/p hysterectomy; continue follow up with Dr. Soundra Pilon  Urethral prolapse S/p hysterectomy; continue follow up with Dr. Soundra Pilon   HT CKD 3 (GFR 52ml/min) Increase fluids, avoid NSAIDS, monitor sugars, will monitor -     BASIC METABOLIC PANEL WITH GFR  Vitamin D deficiency At goal; Continue supplementation Check vitamin D level  Hx/o DVT/PE Continue low dose ASA   Mixed hyperlipidemia Well controlled; continue statin Continue low cholesterol diet and exercise.  Check lipid panel  Other abnormal glucose Discussed disease and risks Discussed diet/exercise, weight management  Check A1C  Orders Placed This Encounter  Procedures  . CBC with Differential/Platelet  . COMPLETE METABOLIC PANEL WITH GFR  . Lipid panel  . TSH  . Hemoglobin A1c  . VITAMIN D 25 Hydroxy (Vit-D Deficiency, Fractures)  . Urinalysis w microscopic + reflex cultur  . Microalbumin / creatinine urine ratio  . Vitamin B12   Discussed med's effects and SE's. Screening labs and tests as requested with  regular follow-up as recommended. Over 40 minutes of exam, counseling, chart review, and complex, high level critical decision making was performed this visit.   Future Appointments  Date Time Provider [place]  07/14/2018  8:00 AM [name], NP [facility] None  11/04/2018 10:30 AM Fontaine, Belinda Block, MD [facility] [name]  05/20/2019 10:00 AM [name], NP [facility] None     HPI  84 y.o. female  presents for a complete physical and follow up for has History of endometrial cancer; Hx/o DVT/PE; Osteoporosis; Urethral prolapse; Essential hypertension; Mixed hyperlipidemia; Other abnormal glucose; Vitamin D deficiency; Essential tremor; Medication management;  HT CKD 3 (GFR 39ml/min); Medicare annual wellness visit, subsequent; History of TIA (transient ischemic attack); Pre-syncope; and Leg swelling on their problem list. The patient was admitted in 10/2017 for a L Brain TIA w/ transient expressive aphasia. MRI, MRA, 2D echo, Carotid Dopplers, were Normal. Patient was released on LD bASA. Patient also has HLD, Essential tremor, PreDM  and Vit D deficiency. She is followed by Neurology. She is also followed by Dr. Soundra Pilon (GYN) annually who manages PAPs, MMG, DEXA. She is currently on drug break from bisphosphonate for osteoporosis. Takes Clonazepam 0.5 mg for tremors as needed.   BMI is Body mass index is 26.06 kg/m., she has been working on diet and exercise. Wt Readings from Last 3 Encounters:  05/18/18 151 lb 12.8 oz (68.9 kg)  05/06/18 149 lb 1.9 oz (67.6 kg)  04/23/18 152 lb (68.9 kg)   Her blood pressure has been controlled at home, today [name] is BP: 126/78 She does workout. She denies chest pain, shortness of breath, dizziness.   She is on cholesterol medication (atorvastatin 20 mg daily) and denies  myalgias. Her cholesterol is not at goal. The cholesterol last visit was:   Lab Results  Component Value Date   CHOL 170 02/12/2018   HDL 42 (L) 02/12/2018   LDLCALC 103  (H) 02/12/2018   TRIG 152 (H) 02/12/2018   CHOLHDL 4.0 02/12/2018    She has been working on diet and exercise for glucose management, she is on bASA, and denies foot ulcerations, increased appetite, nausea, paresthesia of the feet, polydipsia, polyuria, visual disturbances, vomiting and weight loss. Last A1C in the office was:  Lab Results  Component Value Date   HGBA1C 5.4 02/12/2018   Last GFR: Lab Results  Component Value Date   GFRNONAA 41 (L) 02/12/2018   Patient is on Vitamin D supplement   Lab Results  Component Value Date   VD25OH 59 02/12/2018      Current Medications:  Current Outpatient Medications on File Prior to Visit  Medication Sig Dispense Refill  . aspirin EC 325 MG EC tablet Take 1 tablet (325 mg total) by mouth daily.    Marland Kitchen atorvastatin (LIPITOR) 20 MG tablet Take 1 tablet daily for Cholesterol 90 tablet 1  . Calcium-Magnesium-Vitamin D 300-20-200 MG-MG-UNIT CHEW Chew 1 tablet daily by mouth.     . Cholecalciferol (VITAMIN D3) 5000 units TABS Take 1 tablet daily by mouth.    . clonazePAM (KLONOPIN) 0.5 MG tablet Take 1 pill in AM, 1/2 pill at noon and 1/2 pill at night. 60 tablet 5  . propranolol (INDERAL) 40 MG tablet Take 1 tablet 4 x / day with meals & Bedtime for BP & Tremor 360 tablet 1   No current facility-administered medications on file prior to visit.    Allergies:  Allergies  Allergen Reactions  . Codeine Hives  . Meloxicam Nausea Only    Gastritis and nausea  . Mysoline [Primidone]   . Pneumovax 23 [Pneumococcal Vac Polyvalent]     Red, raised area.  Knot at site of injection  . Toradol [Ketorolac Tromethamine] Swelling    Localized redness at injection site.     Medical History:  She has History of endometrial cancer; Hx/o DVT/PE; Osteoporosis; Urethral prolapse; Essential hypertension; Mixed hyperlipidemia; Other abnormal glucose; Vitamin D deficiency; Essential tremor; Medication management;  HT CKD 3 (GFR 9ml/min); Medicare annual  wellness visit, subsequent; History of TIA (transient ischemic attack); Pre-syncope; and Leg swelling on their problem list. Health Maintenance:   Immunization History  Administered Date(s) Administered  . H1N1 01/03/2009  . Influenza Whole 09/23/2013  . Influenza, High Dose Seasonal PF 09/06/2014, 08/23/2015, 08/21/2016, 08/28/2017  . PPD Test 03/29/2014  . Pneumococcal Polysaccharide-23 03/29/2014  . Pneumococcal-Unspecified 12/09/2002  . Td 03/23/2013  . Zoster 12/09/2006   Preventative care: Last colonoscopy: 2011 MGM 11/2017 DEXA 2016, 2018 - by Dr. Phineas Real, Osteoporosis, on break from bisphosphonates PAP 2018 negative  Influenza 2018 TD 2014 Pneumonia 2015 prevnar 13 has allergy - D/C'd Zoster 2008  Names of Other Physician/Practitioners you currently use: 1. [facility] here for primary care 2. Dr Rutherford Guys, eye doctor, last visit 2019, has cataracts, monitoring 3. Dr Ennis Forts, [place], dentist, last visit 2019 & every 4 months  Patient Care Team: Unk Pinto, MD as PCP - General (Internal Medicine) Fay Records, MD as PCP - Cardiology (Cardiology) Ronald Lobo, MD as Consulting Physician (Gastroenterology) Rutherford Guys, MD as Consulting Physician (Ophthalmology) Clance, Armando Reichert, MD as Consulting Physician (Pulmonary Disease) Fontaine, Belinda Block, MD as Consulting Physician (Gynecology) Lennon Alstrom,  MD as Consulting Physician (Neurology) [name], MD as Attending Physician (Neurology) Harriett Sine, MD as Consulting Physician (Dermatology)  Surgical History:  She has a past surgical history that includes Cesarean section (1961); Abdominal hysterectomy (1997); Oophorectomy; Tonsillectomy and adenoidectomy; and Appendectomy. Family History:  Herfamily history includes Cancer in her brother, brother, father, and mother; Diabetes in her sister; Heart disease in her brother, brother, sister, and sister; Kidney failure  in her brother and sister. Social History:  She reports that she has never smoked. She has never used smokeless tobacco. She reports that she drinks alcohol. She reports that she does not use drugs.  Review of Systems: Review of Systems  Constitutional: Negative for malaise/fatigue and weight loss.  HENT: Negative for hearing loss and tinnitus.   Eyes: Negative for blurred vision and double vision.  Respiratory: Negative for cough, sputum production, shortness of breath and wheezing.   Cardiovascular: Positive for palpitations (Rare heart racing, has been worked up by cardiology). Negative for chest pain, orthopnea, claudication, leg swelling and PND.  Gastrointestinal: Negative for abdominal pain, blood in stool, constipation, diarrhea, heartburn, melena, nausea and vomiting.  Genitourinary: Negative.   Musculoskeletal: Negative for falls, joint pain and myalgias.  Skin: Negative for rash.  Neurological: Positive for tremors (Baseline, bilateral hands). Negative for dizziness, tingling, sensory change, weakness and headaches.  Endo/Heme/Allergies: Negative for polydipsia.  Psychiatric/Behavioral: Negative.  Negative for depression, memory loss, substance abuse and suicidal ideas. The patient is not nervous/anxious and does not have insomnia.   All other systems reviewed and are negative.   Physical Exam: Estimated body mass index is 26.06 kg/m as calculated from the following:   Height as of this encounter: 5\' 4"  (1.626 m).   Weight as of this encounter: 151 lb 12.8 oz (68.9 kg). BP 126/78   Pulse 73   Temp (!) 97.5 F (36.4 C)   Ht 5\' 4"  (1.626 m)   Wt 151 lb 12.8 oz (68.9 kg)   SpO2 97%   BMI 26.06 kg/m  General Appearance: Well nourished, in no apparent distress.  Eyes: PERRLA, EOMs, conjunctiva no swelling or erythema, normal fundi and vessels.  Sinuses: No Frontal/maxillary tenderness  ENT/Mouth: Ext aud canals clear, normal light reflex with TMs without erythema, bulging.  Good dentition. No erythema, swelling, or exudate on post pharynx. Tonsils not swollen or erythematous. Hearing normal.  Neck: Supple, thyroid normal. No bruits  Respiratory: Respiratory effort normal, BS equal bilaterally without rales, rhonchi, wheezing or stridor.  Cardio: RRR without murmurs, rubs or gallops. Brisk peripheral pulses without edema.  Chest: symmetric, with normal excursions and percussion.  Breasts: Defer Abdomen: Soft, nontender, no guarding, rebound, hernias, masses, or organomegaly.  Lymphatics: Non tender without lymphadenopathy.  Genitourinary: Defer  Musculoskeletal: Full ROM all peripheral extremities,5/5 strength, and normal gait.  Skin: Warm, dry without rashes, lesions, ecchymosis. Neuro: Cranial nerves intact, reflexes equal bilaterally. Normal muscle tone, no cerebellar symptoms. Sensation intact.  Psych: Awake and oriented X 3, normal affect, Insight and Judgment appropriate.   EKG: WNL no ST changes.  Gorden Harms Stefano Trulson 10:21 AM [facility]

## 2018-05-18 ENCOUNTER — Encounter: Payer: Self-pay | Admitting: Adult Health

## 2018-05-18 ENCOUNTER — Ambulatory Visit (INDEPENDENT_AMBULATORY_CARE_PROVIDER_SITE_OTHER): Payer: Medicare Other | Admitting: Adult Health

## 2018-05-18 VITALS — BP 126/78 | HR 73 | Temp 97.5°F | Ht 64.0 in | Wt 151.8 lb

## 2018-05-18 DIAGNOSIS — N183 Chronic kidney disease, stage 3 unspecified: Secondary | ICD-10-CM

## 2018-05-18 DIAGNOSIS — Z8542 Personal history of malignant neoplasm of other parts of uterus: Secondary | ICD-10-CM

## 2018-05-18 DIAGNOSIS — E559 Vitamin D deficiency, unspecified: Secondary | ICD-10-CM

## 2018-05-18 DIAGNOSIS — Z0000 Encounter for general adult medical examination without abnormal findings: Secondary | ICD-10-CM | POA: Diagnosis not present

## 2018-05-18 DIAGNOSIS — M810 Age-related osteoporosis without current pathological fracture: Secondary | ICD-10-CM

## 2018-05-18 DIAGNOSIS — R7309 Other abnormal glucose: Secondary | ICD-10-CM

## 2018-05-18 DIAGNOSIS — Z86718 Personal history of other venous thrombosis and embolism: Secondary | ICD-10-CM

## 2018-05-18 DIAGNOSIS — G250 Essential tremor: Secondary | ICD-10-CM

## 2018-05-18 DIAGNOSIS — N368 Other specified disorders of urethra: Secondary | ICD-10-CM

## 2018-05-18 DIAGNOSIS — E782 Mixed hyperlipidemia: Secondary | ICD-10-CM

## 2018-05-18 DIAGNOSIS — Z1389 Encounter for screening for other disorder: Secondary | ICD-10-CM

## 2018-05-18 DIAGNOSIS — Z8673 Personal history of transient ischemic attack (TIA), and cerebral infarction without residual deficits: Secondary | ICD-10-CM

## 2018-05-18 DIAGNOSIS — I10 Essential (primary) hypertension: Secondary | ICD-10-CM

## 2018-05-18 DIAGNOSIS — Z79899 Other long term (current) drug therapy: Secondary | ICD-10-CM

## 2018-05-18 DIAGNOSIS — Z0001 Encounter for general adult medical examination with abnormal findings: Secondary | ICD-10-CM

## 2018-05-18 DIAGNOSIS — Z130 Encounter for screening for diseases of the blood and blood-forming organs and certain disorders involving the immune mechanism: Secondary | ICD-10-CM

## 2018-05-18 NOTE — Patient Instructions (Signed)
Aim for 7+ servings of fruits and vegetables daily  80+ fluid ounces of water or unsweet tea for healthy kidneys  Limit alcohol intake, avoid smoke/tobacco  Limit animal fats in diet for cholesterol and heart health - choose grass fed whenever available  Aim for low stress - take time to unwind and care for your mental health  Aim for 150 min of moderate intensity exercise weekly for heart health, and weights twice weekly for bone health  Aim for 7-9 hours of sleep daily     When it comes to diets, agreement about the perfect plan isn't easy to find, even among the experts. Experts at the [employer] developed an idea known as the Healthy Eating Plate. Just imagine a plate divided into logical, healthy portions.  The emphasis is on diet quality:  Load up on vegetables and fruits - one-half of your plate: Aim for color and variety, and remember that potatoes don't count.  Go for whole grains - one-quarter of your plate: Whole wheat, barley, wheat berries, quinoa, oats, brown rice, and foods made with them. If you want pasta, go with whole wheat pasta.  Protein power - one-quarter of your plate: Fish, chicken, beans, and nuts are all healthy, versatile protein sources. Limit red meat.  The diet, however, does go beyond the plate, offering a few other suggestions.  Use healthy plant oils, such as olive, canola, soy, corn, sunflower and peanut. Check the labels, and avoid partially hydrogenated oil, which have unhealthy trans fats.  If you're thirsty, drink water. Coffee and tea are good in moderation, but skip sugary drinks and limit milk and dairy products to one or two daily servings.  The type of carbohydrate in the diet is more important than the amount. Some sources of carbohydrates, such as vegetables, fruits, whole grains, and beans-are healthier than others.  Finally, stay active.

## 2018-05-20 LAB — COMPLETE METABOLIC PANEL WITH GFR
AG Ratio: 1.6 (calc) (ref 1.0–2.5)
ALBUMIN MSPROF: 4.1 g/dL (ref 3.6–5.1)
ALT: 13 U/L (ref 6–29)
AST: 18 U/L (ref 10–35)
Alkaline phosphatase (APISO): 42 U/L (ref 33–130)
BUN / CREAT RATIO: 14 (calc) (ref 6–22)
BUN: 15 mg/dL (ref 7–25)
CALCIUM: 9.6 mg/dL (ref 8.6–10.4)
CO2: 29 mmol/L (ref 20–32)
CREATININE: 1.11 mg/dL — AB (ref 0.60–0.88)
Chloride: 104 mmol/L (ref 98–110)
GFR, EST NON AFRICAN AMERICAN: 46 mL/min/{1.73_m2} — AB (ref >=60)
GFR, Est African American: 53 mL/min/{1.73_m2} — ABNORMAL LOW (ref >=60)
GLUCOSE: 92 mg/dL (ref 65–99)
Globulin: 2.6 g/dL (calc) (ref 1.9–3.7)
Potassium: 4.4 mmol/L (ref 3.5–5.3)
Sodium: 139 mmol/L (ref 135–146)
Total Bilirubin: 0.6 mg/dL (ref 0.2–1.2)
Total Protein: 6.7 g/dL (ref 6.1–8.1)

## 2018-05-20 LAB — URINE CULTURE
MICRO NUMBER:: 90698783
SPECIMEN QUALITY: ADEQUATE

## 2018-05-20 LAB — CBC WITH DIFFERENTIAL/PLATELET
BASOS PCT: 1.6 %
Basophils Absolute: 61 cells/uL (ref 0–200)
EOS PCT: 3.1 %
Eosinophils Absolute: 118 cells/uL (ref 15–500)
HEMATOCRIT: 40.7 % (ref 35.0–45.0)
HEMOGLOBIN: 13.6 g/dL (ref 11.7–15.5)
LYMPHS ABS: 1573 {cells}/uL (ref 850–3900)
MCH: 28.9 pg (ref 27.0–33.0)
MCHC: 33.4 g/dL (ref 32.0–36.0)
MCV: 86.4 fL (ref 80.0–100.0)
MONOS PCT: 8.9 %
MPV: 10 fL (ref 7.5–12.5)
NEUTROS ABS: 1710 {cells}/uL (ref 1500–7800)
Neutrophils Relative %: 45 %
Platelets: 229 10*3/uL (ref 140–400)
RBC: 4.71 10*6/uL (ref 3.80–5.10)
RDW: 13.5 % (ref 11.0–15.0)
Total Lymphocyte: 41.4 %
WBC mixed population: 338 cells/uL (ref 200–950)
WBC: 3.8 10*3/uL (ref 3.8–10.8)

## 2018-05-20 LAB — VITAMIN D 25 HYDROXY (VIT D DEFICIENCY, FRACTURES): VIT D 25 HYDROXY: 51 ng/mL (ref 30–100)

## 2018-05-20 LAB — TSH: TSH: 2.56 mIU/L (ref 0.40–4.50)

## 2018-05-20 LAB — LIPID PANEL
CHOL/HDL RATIO: 2.8 (calc) (ref <5.0)
CHOLESTEROL: 110 mg/dL (ref <200)
HDL: 40 mg/dL — AB (ref >50)
LDL CHOLESTEROL (CALC): 46 mg/dL
NON-HDL CHOLESTEROL (CALC): 70 mg/dL (ref <130)
Triglycerides: 161 mg/dL — ABNORMAL HIGH (ref <150)

## 2018-05-20 LAB — URINALYSIS W MICROSCOPIC + REFLEX CULTURE
Bacteria, UA: NONE SEEN /HPF
Bilirubin Urine: NEGATIVE
GLUCOSE, UA: NEGATIVE
HGB URINE DIPSTICK: NEGATIVE
HYALINE CAST: NONE SEEN /LPF
KETONES UR: NEGATIVE
Nitrites, Initial: NEGATIVE
PH: 5.5 (ref 5.0–8.0)
Protein, ur: NEGATIVE
RBC / HPF: NONE SEEN /HPF (ref 0–2)
Specific Gravity, Urine: 1.008 (ref 1.001–1.03)
Squamous Epithelial / LPF: NONE SEEN /HPF (ref <=5)
WBC UA: NONE SEEN /HPF (ref 0–5)

## 2018-05-20 LAB — CULTURE INDICATED

## 2018-05-20 LAB — HEMOGLOBIN A1C
Hgb A1c MFr Bld: 5.4 % of total Hgb (ref <5.7)
Mean Plasma Glucose: 108 (calc)
eAG (mmol/L): 6 (calc)

## 2018-05-20 LAB — MICROALBUMIN / CREATININE URINE RATIO
Creatinine, Urine: 39 mg/dL (ref 20–275)
MICROALB/CREAT RATIO: 10 ug/mg{creat} (ref <30)
Microalb, Ur: 0.4 mg/dL

## 2018-05-20 LAB — VITAMIN B12: Vitamin B-12: 223 pg/mL (ref 200–1100)

## 2018-05-25 ENCOUNTER — Telehealth: Payer: Self-pay

## 2018-05-25 NOTE — Telephone Encounter (Signed)
Patient is questioning whether or not to continue to take atorvastatin since her LDL was so low.

## 2018-05-26 NOTE — Telephone Encounter (Signed)
Patient notified

## 2018-07-14 ENCOUNTER — Encounter: Payer: Self-pay | Admitting: Adult Health

## 2018-07-14 ENCOUNTER — Ambulatory Visit: Payer: Medicare Other | Admitting: Adult Health

## 2018-07-14 DIAGNOSIS — G250 Essential tremor: Secondary | ICD-10-CM

## 2018-07-14 MED ORDER — CLONAZEPAM 0.5 MG PO TABS: ORAL_TABLET | ORAL | 5 refills | Status: DC

## 2018-07-14 NOTE — Progress Notes (Addendum)
PATIENT: Monica Curry DOB: [birth year]  REASON FOR VISIT: follow up HISTORY FROM: patient  HISTORY OF PRESENT ILLNESS: Today 07/14/18:  Monica Curry is an 84 year old female with a history of essential tremor.  She returns today for follow-up.  She continues on clonazepam 1 tablet in a.m. and a half a tablet at noon and before bedtime.  She reports that her primary care increased her propranolol after having the TIA event due to her blood pressure.  She reports the tremor primarily affects the hands.  She is able to complete all ADLs independently.  She noticed that it is hard to put on her make-up due to the tremor.  She is also noted that church she has a hard time taking.  She denies any significant trouble eating.  He reports the tremor is better in the morning after taking the full tablet of clonazepam.  She reports that she has noticed that when she is walking up he will or climbing stairs she is short of breath.  Her cardiologist felt that this was due to the propranolol.  She plans to discuss reducing her dose with her primary care provider.  She returns today for evaluation.   REVIEW OF SYSTEMS: Out of a complete 14 system review of symptoms, the patient complains only of the following symptoms, and all other reviewed systems are negative.  See HPI ALLERGIES: Allergies  Allergen Reactions  . Codeine Hives  . Meloxicam Nausea Only    Gastritis and nausea  . Mysoline [Primidone]   . Pneumovax 23 [Pneumococcal Vac Polyvalent]     Red, raised area.  Knot at site of injection  . Toradol [Ketorolac Tromethamine] Swelling    Localized redness at injection site.      HOME MEDICATIONS: Outpatient Medications Prior to Visit  Medication Sig Dispense Refill  . aspirin EC 325 MG EC tablet Take 1 tablet (325 mg total) by mouth daily.    Marland Kitchen atorvastatin (LIPITOR) 20 MG tablet Take 1 tablet daily for Cholesterol 90 tablet 1  . Calcium-Magnesium-Vitamin D 300-20-200 MG-MG-UNIT CHEW  Chew 1 tablet daily by mouth.     . Cholecalciferol (VITAMIN D3) 5000 units TABS Take 1 tablet daily by mouth.    . clonazePAM (KLONOPIN) 0.5 MG tablet Take 1 pill in AM, 1/2 pill at noon and 1/2 pill at night. 60 tablet 5  . propranolol (INDERAL) 40 MG tablet Take 1 tablet 4 x / day with meals & Bedtime for BP & Tremor 360 tablet 1  . vitamin B-12 (CYANOCOBALAMIN) 500 MCG tablet Take 500 mcg by mouth daily.     No facility-administered medications prior to visit.     PAST MEDICAL HISTORY: Past Medical History:  Diagnosis Date  . C. difficile colitis   . Cancer [place])    Endometrial cancer  . Essential and other specified forms of tremor   . Osteoporosis 11/2017   T score -2.7 stable from prior DEXA 2016  . Personal history of venous thrombosis and embolism 1997  . Stroke ([place])   . Thrush   . TIA (transient ischemic attack)     PAST SURGICAL HISTORY: Past Surgical History:  Procedure Laterality Date  . ABDOMINAL HYSTERECTOMY  1997  . APPENDECTOMY    . [place]  . OOPHORECTOMY     BSO  . TONSILLECTOMY AND ADENOIDECTOMY      FAMILY HISTORY: Family History  Problem Relation Age of Onset  . Cancer Mother  Unknown type  . Cancer Father        Bone  . Cancer Brother        Prostate  . Heart disease Brother   . Cancer Brother        Prostate  . Heart disease Brother   . Kidney failure Brother   . Heart disease Sister   . Heart disease Sister   . Kidney failure Sister   . Diabetes Sister     SOCIAL HISTORY: Social History   Socioeconomic History  . Marital status: Widowed    Spouse name: Not on file  . Number of children: Not on file  . Years of education: Not on file  . Highest education level: Not on file  Occupational History  . Not on file  Social Needs  . Financial resource strain: Not on file  . Food insecurity:    Worry: Not on file    Inability: Not on file  . Transportation needs:    Medical: Not on file    Non-medical: Not  on file  Tobacco Use  . Smoking status: Never Smoker  . Smokeless tobacco: Never Used  Substance and Sexual Activity  . Alcohol use: Yes    Alcohol/week: 0.0 oz    Comment: ocassionally  . Drug use: No  . Sexual activity: Never    Birth control/protection: Post-menopausal, Surgical    Comment: HYST-1st intercourse 84 yo-Fewer than 5 partners  Lifestyle  . Physical activity:    Days per week: Not on file    Minutes per session: Not on file  . Stress: Not on file  Relationships  . Social connections:    Talks on phone: Not on file    Gets together: Not on file    Attends religious service: Not on file    Active member of club or organization: Not on file    Attends meetings of clubs or organizations: Not on file    Relationship status: Not on file  . Intimate partner violence:    Fear of current or ex partner: Not on file    Emotionally abused: Not on file    Physically abused: Not on file    Forced sexual activity: Not on file  Other Topics Concern  . Not on file  Social History Narrative  . Not on file      PHYSICAL EXAM  Vitals:   07/14/18 0755  BP: 132/72  Pulse: 68  Weight: 149 lb 12.8 oz (67.9 kg)  Height: 5\' 4"  (1.626 m)   Body mass index is 25.71 kg/m.  Generalized: Well developed, in no acute distress   Neurological examination  Mentation: Alert oriented to time, place, history taking. Follows all commands speech and language fluent Cranial nerve II-XII: Pupils were equal round reactive to light. Extraocular movements were full, visual field were full on confrontational test. Facial sensation and strength were normal. Uvula tongue midline. Head turning and shoulder shrug  were normal and symmetric. Motor: The motor testing reveals 5 over 5 strength of all 4 extremities. Good symmetric motor tone is noted throughout.  Sensory: Sensory testing is intact to soft touch on all 4 extremities. No evidence of extinction is noted.  Coordination: Cerebellar  testing reveals good finger-nose-finger and heel-to-shin bilaterally.  Intention tremor noted in the hands. Gait and station: Gait is normal. Tandem gait is normal. Romberg is negative. No drift is seen.  Reflexes: Deep tendon reflexes are symmetric and normal bilaterally.   DIAGNOSTIC DATA (LABS, IMAGING,  TESTING) - I reviewed patient records, labs, notes, testing and imaging myself where available.  Lab Results  Component Value Date   WBC 3.8 05/18/2018   HGB 13.6 05/18/2018   HCT 40.7 05/18/2018   MCV 86.4 05/18/2018   PLT 229 05/18/2018      Component Value Date/Time   NA 139 05/18/2018 1028   K 4.4 05/18/2018 1028   CL 104 05/18/2018 1028   CO2 29 05/18/2018 1028   GLUCOSE 92 05/18/2018 1028   BUN 15 05/18/2018 1028   CREATININE 1.11 (H) 05/18/2018 1028   CALCIUM 9.6 05/18/2018 1028   PROT 6.7 05/18/2018 1028   ALBUMIN 4.4 10/23/2017 1807   AST 18 05/18/2018 1028   ALT 13 05/18/2018 1028   ALKPHOS 37 (L) 10/23/2017 1807   BILITOT 0.6 05/18/2018 1028   GFRNONAA 46 (L) 05/18/2018 1028   GFRAA 53 (L) 05/18/2018 1028   Lab Results  Component Value Date   CHOL 110 05/18/2018   HDL 40 (L) 05/18/2018   LDLCALC 46 05/18/2018   TRIG 161 (H) 05/18/2018   CHOLHDL 2.8 05/18/2018   Lab Results  Component Value Date   HGBA1C 5.4 05/18/2018   Lab Results  Component Value Date   VITAMINB12 223 05/18/2018   Lab Results  Component Value Date   TSH 2.56 05/18/2018      ASSESSMENT AND PLAN 84 y.o. year old female  has a past medical history of C. difficile colitis, Cancer ([place]), Essential and other specified forms of tremor, Osteoporosis (11/2017), Personal history of venous thrombosis and embolism (1997), Stroke ([place]), Thrush, and TIA (transient ischemic attack). here with :  1.  Essential tremor  Overall the patient has remained stable.  She will continue on clonazepam.  She will discuss reducing her propranolol dose with her primary care provider.  She is advised  that if her symptoms worsen or she develops new symptoms she should let us know.  She will follow-up in 1 year or sooner if needed.   I spent 15 minutes with the patient. 50% of this time was spent discussing her medication and plan of care.   [name], MSN, NP-C 07/14/2018, 7:54 AM [facility] [address] [place], [place] [address] [phone]  I reviewed the above note and documentation by the Nurse Practitioner and agree with the history, physical exam, assessment and plan as outlined above. I was immediately available for face-to-face consultation. [name], MD, PhD [facility])

## 2018-07-14 NOTE — Patient Instructions (Signed)
Your Plan:  Continue clonazepam  Speak to PCP about reducing propranolol dose.  If your symptoms worsen or you develop new symptoms please let us know.   Thank you for coming to see [place] at [facility]. I hope we have been able to provide you high quality care today.  You may receive a patient satisfaction survey over the next few weeks. We would appreciate your feedback and comments so that we may continue to improve ourselves and the health of our patients.

## 2018-08-04 ENCOUNTER — Other Ambulatory Visit: Payer: Self-pay

## 2018-08-04 DIAGNOSIS — G250 Essential tremor: Secondary | ICD-10-CM

## 2018-08-04 DIAGNOSIS — I10 Essential (primary) hypertension: Secondary | ICD-10-CM

## 2018-08-04 MED ORDER — PROPRANOLOL HCL 40 MG PO TABS: ORAL_TABLET | ORAL | 1 refills | Status: DC

## 2018-08-06 ENCOUNTER — Ambulatory Visit: Payer: Self-pay | Admitting: Adult Health

## 2018-08-30 NOTE — Progress Notes (Signed)
FOLLOW UP  Assessment and Plan:   Hypertension -Continue medication, monitor blood pressure at home. Continue DASH diet.  Reminder to go to the ER if any CP, SOB, nausea, dizziness, severe HA, changes vision/speech, left arm numbness and tingling and jaw pain.  Cholesterol -Continue diet and exercise. Check cholesterol.  Went to 1/2 of lipitor  Vitamin D Def - check level and continue medications.   Continue diet and meds as discussed. Further disposition pending results of labs. Over 30 minutes of exam, counseling, chart review, and critical decision making was performed  Future Appointments  Date Time Provider [place]  11/04/2018 10:30 AM Fontaine, Belinda Block, MD [name]  11/16/2018  4:00 PM Fay Records, MD [facility]  12/07/2018 10:30 AM Unk Pinto, MD [facility] None  05/20/2019 10:00 AM [name], NP [facility] None  07/21/2019  9:30 AM [name], NP [facility] None     HPI 84 y.o. female  presents for 3 month follow up on hypertension, cholesterol, prediabetes, and vitamin D deficiency.   Her blood pressure has been controlled at home, today [name] is BP: 120/64   The patient was admitted for TIA, follows with neurology. She has tremors and takes klonopin 0.5 PRN for it.     She does not workout. She denies chest pain, shortness of breath, dizziness.   She  is  on cholesterol medication, she has been taking 1/2 of her lipitor last 3 months and denies myalgias. Her cholesterol is at goal. The cholesterol last visit was:   Lab Results  Component Value Date   CHOL 110 05/18/2018   HDL 40 (L) 05/18/2018   LDLCALC 46 05/18/2018   TRIG 161 (H) 05/18/2018   CHOLHDL 2.8 05/18/2018   Last A1C in the office was:  Lab Results  Component Value Date   HGBA1C 5.4 05/18/2018   Patient is on Vitamin D supplement.   Lab Results  Component Value Date   VD25OH 51 05/18/2018     BMI is Body mass index is 26.37 kg/m., she is working  on diet and exercise. Wt Readings from Last 3 Encounters:  09/01/18 153 lb 9.6 oz (69.7 kg)  07/14/18 149 lb 12.8 oz (67.9 kg)  05/18/18 151 lb 12.8 oz (68.9 kg)       Current Medications:  Current Outpatient Medications on File Prior to Visit  Medication Sig  . aspirin EC 325 MG EC tablet Take 1 tablet (325 mg total) by mouth daily.  Marland Kitchen atorvastatin (LIPITOR) 20 MG tablet Take 1 tablet daily for Cholesterol  . Calcium-Magnesium-Vitamin D 300-20-200 MG-MG-UNIT CHEW Chew 1 tablet daily by mouth.   . Cholecalciferol (VITAMIN D3) 5000 units TABS Take 1 tablet daily by mouth.  . clonazePAM (KLONOPIN) 0.5 MG tablet Take 1 pill in AM, 1/2 pill at noon and 1/2 pill at night.  . propranolol (INDERAL) 40 MG tablet Take 1 tablet 4 x / day with meals & Bedtime for BP & Tremor  . vitamin B-12 (CYANOCOBALAMIN) 500 MCG tablet Take 500 mcg by mouth daily.   No current facility-administered medications on file prior to visit.     Medical History:  Past Medical History:  Diagnosis Date  . C. difficile colitis   . Cancer [facility])    Endometrial cancer  . Essential and other specified forms of tremor   . Osteoporosis 11/2017   T score -2.7 stable from prior DEXA 2016  . Personal history of venous thrombosis and embolism 1997  . Stroke (  [place])   . Thrush   . TIA (transient ischemic attack)    Allergies:  Allergies  Allergen Reactions  . Codeine Hives  . Meloxicam Nausea Only    Gastritis and nausea  . Mysoline [Primidone]   . Pneumovax 23 [Pneumococcal Vac Polyvalent]     Red, raised area.  Knot at site of injection  . Toradol [Ketorolac Tromethamine] Swelling    Localized redness at injection site.       Review of Systems:  Review of Systems  Constitutional: Negative.   HENT: Negative.   Eyes: Negative.   Respiratory: Negative.   Cardiovascular: Negative.   Gastrointestinal: Negative.   Genitourinary: Negative.   Musculoskeletal: Negative.   Skin: Negative.     Family history-  Review and unchanged Social history- Review and unchanged Physical Exam: BP 120/64   Pulse 60   Temp 98.2 F (36.8 C)   Resp 12   Ht 5\' 4"  (1.626 m)   Wt 153 lb 9.6 oz (69.7 kg)   SpO2 98%   BMI 26.37 kg/m  Wt Readings from Last 3 Encounters:  09/01/18 153 lb 9.6 oz (69.7 kg)  07/14/18 149 lb 12.8 oz (67.9 kg)  05/18/18 151 lb 12.8 oz (68.9 kg)   General Appearance: Well nourished, in no apparent distress. Eyes: PERRLA, EOMs, conjunctiva no swelling or erythema Sinuses: No Frontal/maxillary tenderness ENT/Mouth: Ext aud canals clear, TMs without erythema, bulging. No erythema, swelling, or exudate on post pharynx.  Tonsils not swollen or erythematous. Hearing normal.  Neck: Supple, thyroid normal.  Respiratory: Respiratory effort normal, BS equal bilaterally without rales, rhonchi, wheezing or stridor.  Cardio: RRR with no MRGs. Brisk peripheral pulses without edema.  Abdomen: Soft, + BS,  Non tender, no guarding, rebound, hernias, masses. Lymphatics: Non tender without lymphadenopathy.  Musculoskeletal: Full ROM, 5/5 strength, Normal gait Skin: Warm, dry without rashes, lesions, ecchymosis.  Neuro: Cranial nerves intact. Normal muscle tone, no cerebellar symptoms. Psych: Awake and oriented X 3, normal affect, Insight and Judgment appropriate.    Vicie Mutters, PA-C 3:57 PM [facility]

## 2018-09-01 ENCOUNTER — Ambulatory Visit: Payer: Medicare Other | Admitting: Physician Assistant

## 2018-09-01 ENCOUNTER — Encounter: Payer: Self-pay | Admitting: Physician Assistant

## 2018-09-01 ENCOUNTER — Ambulatory Visit: Payer: Self-pay | Admitting: Adult Health

## 2018-09-01 VITALS — BP 120/64 | HR 60 | Temp 98.2°F | Resp 12 | Ht 64.0 in | Wt 153.6 lb

## 2018-09-01 DIAGNOSIS — N183 Chronic kidney disease, stage 3 unspecified: Secondary | ICD-10-CM

## 2018-09-01 DIAGNOSIS — I10 Essential (primary) hypertension: Secondary | ICD-10-CM

## 2018-09-01 DIAGNOSIS — G250 Essential tremor: Secondary | ICD-10-CM | POA: Diagnosis not present

## 2018-09-01 DIAGNOSIS — M810 Age-related osteoporosis without current pathological fracture: Secondary | ICD-10-CM

## 2018-09-01 DIAGNOSIS — E782 Mixed hyperlipidemia: Secondary | ICD-10-CM

## 2018-09-01 DIAGNOSIS — Z23 Encounter for immunization: Secondary | ICD-10-CM

## 2018-09-01 DIAGNOSIS — R7309 Other abnormal glucose: Secondary | ICD-10-CM

## 2018-09-01 DIAGNOSIS — Z79899 Other long term (current) drug therapy: Secondary | ICD-10-CM

## 2018-09-01 NOTE — Patient Instructions (Addendum)
Your LDL is not in range or at goal, goal is less than 70.  Your LDL is the bad cholesterol that can lead to heart attack and stroke. To lower your number you can decrease your fatty foods, red meat, cheese, milk and increase fiber like whole grains and veggies. You can also add a fiber supplement like Citracel or Benefiber, these do not cause gas and bloating and are safe to use. Since you have risk factors that make [place] want your number below 70, we need to adjust or add medications to get your number below goal.   Statin therapy  In addition to the known lipid-lowering effects, statins are now widely accepted to have anti-inflammatory and immunomodulatory effects. Adjunctive use of statins has proven beneficial in the context of a wide range of inflammatory diseases, including rheumatoid arthritis.  Research has shown that the salutary effect of statins on cholesterol may not be their only benefit. Statin therapy has shown promise for everything from fighting viral infections to protecting the eye from cataracts.  A 2005 study of more than 700 hospital patients being treated for pneumonia found that the death rate was more than twice as high among those who were not using statins. In 2006, a [place] study examined the rate of sepsis, a deadly blood infection, among patients who had been hospitalized for heart events. In the two years after their hospitalization, the statin users had a rate of sepsis 19% lower than that of the non-statin users. A 2009 review of 22 studies found that statins appeared to have a beneficial effect on the outcome of infection, but they couldn't come to a firm conclusion.  VENOUS INSUFFICIENCY Our lower leg venous system is not the most reliable, the heart does NOT pump fluid up, there is a valve system.  The muscles of the leg squeeze and the blood moves up and a valve opens and close, then they squeeze, blood moves up and valves open and closes keeping the blood moving  towards the heart.  Lots can go wrong with this valve system.  If someone is sitting or standing without movement, everyone will get swelling.  THINGS TO DO:  Do not stand or sit in one position for long periods of time. Do not sit with your legs crossed. Rest with your legs raised during the day.  Your legs have to be higher than your heart so that gravity will force the valves to open, so please really elevate your legs.   Wear elastic stockings or support hose. Do not wear other tight, encircling garments around the legs, pelvis, or waist.  ELASTIC THERAPY  has a wide variety of well priced compression stockings. [place], [place] [address] [address] [place] has a good cheap selection, I like the socks, they are not as hard to get on  Walk as much as possible to increase blood flow.  Raise the foot of your bed at night with 2-inch blocks.  SEEK MEDICAL CARE IF:   The skin around your ankle starts to break down.  You have pain, redness, tenderness, or hard swelling developing in your leg over a vein.  You are uncomfortable due to leg pain.  If you ever have shortness of breath with exertion or chest pain go to the ER.        Stroke Prevention Some medical conditions and behaviors are associated with a higher chance of having a stroke. You can help prevent a stroke by  making nutrition, lifestyle, and other changes, including managing any medical conditions you may have. What nutrition changes can be made?  Eat healthy foods. You can do this by: ? Choosing foods high in fiber, such as fresh fruits and vegetables and whole grains. ? Eating at least 5 or more servings of fruits and vegetables a day. Try to fill half of your plate at each meal with fruits and vegetables. ? Choosing lean protein foods, such as lean cuts of meat, poultry without skin, fish, tofu, beans, and nuts. ? Eating low-fat dairy products. ? Avoiding foods that are high in salt  (sodium). This can help lower blood pressure. ? Avoiding foods that have saturated fat, trans fat, and cholesterol. This can help prevent high cholesterol. ? Avoiding processed and premade foods.  Follow your health care provider's specific guidelines for losing weight, controlling high blood pressure (hypertension), lowering high cholesterol, and managing diabetes. These may include: ? Reducing your daily calorie intake. ? Limiting your daily sodium intake to 1,500 milligrams (mg). ? Using only healthy fats for cooking, such as olive oil, canola oil, or sunflower oil. ? Counting your daily carbohydrate intake. What lifestyle changes can be made?  Maintain a healthy weight. Talk to your health care provider about your ideal weight.  Get at least 30 minutes of moderate physical activity at least 5 days a week. Moderate activity includes brisk walking, biking, and swimming.  Do not use any products that contain nicotine or tobacco, such as cigarettes and e-cigarettes. If you need help quitting, ask your health care provider. It may also be helpful to avoid exposure to secondhand smoke.  Limit alcohol intake to no more than 1 drink a day for nonpregnant women and 2 drinks a day for men. One drink equals 12 oz of beer, 5 oz of wine, or 1 oz of hard liquor.  Stop any illegal drug use.  Avoid taking birth control pills. Talk to your health care provider about the risks of taking birth control pills if: ? You are over 59 years old. ? You smoke. ? You get migraines. ? You have ever had a blood clot. What other changes can be made?  Manage your cholesterol levels. ? Eating a healthy diet is important for preventing high cholesterol. If cholesterol cannot be managed through diet alone, you may also need to take medicines. ? Take any prescribed medicines to control your cholesterol as told by your health care provider.  Manage your diabetes. ? Eating a healthy diet and exercising regularly are  important parts of managing your blood sugar. If your blood sugar cannot be managed through diet and exercise, you may need to take medicines. ? Take any prescribed medicines to control your diabetes as told by your health care provider.  Control your hypertension. ? To reduce your risk of stroke, try to keep your blood pressure below 130/80. ? Eating a healthy diet and exercising regularly are an important part of controlling your blood pressure. If your blood pressure cannot be managed through diet and exercise, you may need to take medicines. ? Take any prescribed medicines to control hypertension as told by your health care provider. ? Ask your health care provider if you should monitor your blood pressure at home. ? Have your blood pressure checked every year, even if your blood pressure is normal. Blood pressure increases with age and some medical conditions.  Get evaluated for sleep disorders (sleep apnea). Talk to your health care provider about getting a sleep  evaluation if you snore a lot or have excessive sleepiness.  Take over-the-counter and prescription medicines only as told by your health care provider. Aspirin or blood thinners (antiplatelets or anticoagulants) may be recommended to reduce your risk of forming blood clots that can lead to stroke.  Make sure that any other medical conditions you have, such as atrial fibrillation or atherosclerosis, are managed. What are the warning signs of a stroke? The warning signs of a stroke can be easily remembered as BEFAST.  B is for balance. Signs include: ? Dizziness. ? Loss of balance or coordination. ? Sudden trouble walking.  E is for eyes. Signs include: ? A sudden change in vision. ? Trouble seeing.  F is for face. Signs include: ? Sudden weakness or numbness of the face. ? The face or eyelid drooping to one side.  A is for arms. Signs include: ? Sudden weakness or numbness of the arm, usually on one side of the  body.  S is for speech. Signs include: ? Trouble speaking (aphasia). ? Trouble understanding.  T is for time. ? These symptoms may represent a serious problem that is an emergency. Do not wait to see if the symptoms will go away. Get medical help right away. Call your local emergency services (911 in the [place] Do not drive yourself to the hospital.  Other signs of stroke may include: ? A sudden, severe headache with no known cause. ? Nausea or vomiting. ? Seizure.  Where to find more information: For more information, visit:  American Stroke Association: www.strokeassociation.org  National Stroke Association: www.stroke.org  Summary  You can prevent a stroke by eating healthy, exercising, not smoking, limiting alcohol intake, and managing any medical conditions you may have.  Do not use any products that contain nicotine or tobacco, such as cigarettes and e-cigarettes. If you need help quitting, ask your health care provider. It may also be helpful to avoid exposure to secondhand smoke.  Remember BEFAST for warning signs of stroke. Get help right away if you or a loved one has any of these signs. This information is not intended to replace advice given to you by your health care provider. Make sure you discuss any questions you have with your health care provider. Document Released: 01/02/2005 Document Revised: 12/31/2016 Document Reviewed: 12/31/2016 Elsevier Interactive Patient Education  [employer].

## 2018-09-02 LAB — COMPLETE METABOLIC PANEL WITH GFR
AG RATIO: 1.6 (calc) (ref 1.0–2.5)
ALKALINE PHOSPHATASE (APISO): 37 U/L (ref 33–130)
ALT: 15 U/L (ref 6–29)
AST: 18 U/L (ref 10–35)
Albumin: 3.9 g/dL (ref 3.6–5.1)
BILIRUBIN TOTAL: 0.4 mg/dL (ref 0.2–1.2)
BUN/Creatinine Ratio: 19 (calc) (ref 6–22)
BUN: 24 mg/dL (ref 7–25)
CALCIUM: 9.5 mg/dL (ref 8.6–10.4)
CO2: 29 mmol/L (ref 20–32)
Chloride: 102 mmol/L (ref 98–110)
Creat: 1.26 mg/dL — ABNORMAL HIGH (ref 0.60–0.88)
GFR, Est African American: 45 mL/min/{1.73_m2} — ABNORMAL LOW (ref >=60)
GFR, Est Non African American: 39 mL/min/{1.73_m2} — ABNORMAL LOW (ref >=60)
Globulin: 2.5 g/dL (calc) (ref 1.9–3.7)
Glucose, Bld: 105 mg/dL — ABNORMAL HIGH (ref 65–99)
POTASSIUM: 4.5 mmol/L (ref 3.5–5.3)
Sodium: 137 mmol/L (ref 135–146)
Total Protein: 6.4 g/dL (ref 6.1–8.1)

## 2018-09-02 LAB — CBC WITH DIFFERENTIAL/PLATELET
BASOS ABS: 52 {cells}/uL (ref 0–200)
Basophils Relative: 1 %
EOS ABS: 161 {cells}/uL (ref 15–500)
EOS PCT: 3.1 %
HCT: 40.3 % (ref 35.0–45.0)
Hemoglobin: 13.4 g/dL (ref 11.7–15.5)
Lymphs Abs: 2371 cells/uL (ref 850–3900)
MCH: 28.9 pg (ref 27.0–33.0)
MCHC: 33.3 g/dL (ref 32.0–36.0)
MCV: 86.9 fL (ref 80.0–100.0)
MONOS PCT: 8 %
MPV: 10.4 fL (ref 7.5–12.5)
Neutro Abs: 2200 cells/uL (ref 1500–7800)
Neutrophils Relative %: 42.3 %
PLATELETS: 248 10*3/uL (ref 140–400)
RBC: 4.64 10*6/uL (ref 3.80–5.10)
RDW: 13.9 % (ref 11.0–15.0)
TOTAL LYMPHOCYTE: 45.6 %
WBC mixed population: 416 cells/uL (ref 200–950)
WBC: 5.2 10*3/uL (ref 3.8–10.8)

## 2018-09-02 LAB — TSH: TSH: 1.78 m[IU]/L (ref 0.40–4.50)

## 2018-09-02 LAB — LIPID PANEL
Cholesterol: 125 mg/dL (ref <200)
HDL: 45 mg/dL — AB (ref >50)
LDL Cholesterol (Calc): 57 mg/dL (calc)
Non-HDL Cholesterol (Calc): 80 mg/dL (calc) (ref <130)
TRIGLYCERIDES: 146 mg/dL (ref <150)
Total CHOL/HDL Ratio: 2.8 (calc) (ref <5.0)

## 2018-09-02 LAB — MAGNESIUM: Magnesium: 1.8 mg/dL (ref 1.5–2.5)

## 2018-09-14 ENCOUNTER — Telehealth: Payer: Self-pay | Admitting: Physician Assistant

## 2018-09-15 NOTE — Telephone Encounter (Signed)
Continue to drink plenty of water mainly, actually measure it out and aim for 80 oz a day

## 2018-09-15 NOTE — Telephone Encounter (Signed)
LVM for return phone call on Oct 8th 2019 at 112:32pm by  DD

## 2018-09-30 ENCOUNTER — Other Ambulatory Visit: Payer: Self-pay | Admitting: Internal Medicine

## 2018-09-30 DIAGNOSIS — Z79899 Other long term (current) drug therapy: Secondary | ICD-10-CM

## 2018-09-30 DIAGNOSIS — E782 Mixed hyperlipidemia: Secondary | ICD-10-CM

## 2018-10-02 NOTE — Telephone Encounter (Signed)
Error

## 2018-11-04 ENCOUNTER — Encounter: Payer: Self-pay | Admitting: Gynecology

## 2018-11-04 ENCOUNTER — Ambulatory Visit (INDEPENDENT_AMBULATORY_CARE_PROVIDER_SITE_OTHER): Payer: Medicare Other | Admitting: Gynecology

## 2018-11-04 VITALS — BP 118/78 | Ht 64.0 in | Wt 151.0 lb

## 2018-11-04 DIAGNOSIS — M810 Age-related osteoporosis without current pathological fracture: Secondary | ICD-10-CM

## 2018-11-04 DIAGNOSIS — N952 Postmenopausal atrophic vaginitis: Secondary | ICD-10-CM | POA: Diagnosis not present

## 2018-11-04 DIAGNOSIS — Z01419 Encounter for gynecological examination (general) (routine) without abnormal findings: Secondary | ICD-10-CM | POA: Diagnosis not present

## 2018-11-04 DIAGNOSIS — Z8542 Personal history of malignant neoplasm of other parts of uterus: Secondary | ICD-10-CM | POA: Diagnosis not present

## 2018-11-04 NOTE — Progress Notes (Signed)
    Monica Curry [birth year] [id number]        84 y.o.  G2P2001 for annual gynecologic exam.  Several issues noted below.  Past medical history,surgical history, problem list, medications, allergies, family history and social history were all reviewed and documented as reviewed in the EPIC chart.  ROS:  Performed with pertinent positives and negatives included in the history, assessment and plan.   Additional significant findings : None   Exam: Monica Curry assistant Vitals:   11/04/18 1022  BP: 118/78  Weight: 151 lb (68.5 kg)  Height: 5\' 4"  (1.626 m)   Body mass index is 25.92 kg/m.  General appearance:  Normal affect, orientation and appearance. Skin: Grossly normal HEENT: Without gross lesions.  No cervical or supraclavicular adenopathy. Thyroid normal.  Lungs:  Clear without wheezing, rales or rhonchi Cardiac: RR, without RMG Abdominal:  Soft, nontender, without masses, guarding, rebound, organomegaly or hernia Breasts:  Examined lying and sitting without masses, retractions, discharge or axillary adenopathy. Pelvic:  Ext, BUS, Vagina: With atrophic changes.  Pap smear of vaginal cuff done  Adnexa: Without masses or tenderness    Anus and perineum: Normal   Rectovaginal: Normal sphincter tone without palpated masses or tenderness.    Assessment/Plan:  84 y.o. G55P2001 female for annual gynecologic exam.   1. History of endometrial adenocarcinoma well differentiated.  Primarily in situ with focus of minimal myometrial invasion (2 mm) 1997.  Status post TAH/BSO.  Exam NED.  Pap smear of vaginal cuff done.  We reviewed the most current screening recommendations as far as endometrial carcinoma and the options to stop Pap smears reviewed.  Will readdress on an annual basis. 2. Osteoporosis.  DEXA this past year T score -2.7 stable from prior DEXA.  History of bisphosphate for over 10 years on drug-free holiday.  Although stable from prior DEXA increased risk of fracture based  on values discussed.  Possible benefit from prior drug use persisting also reviewed.  Options to start medication now such as Prolia to maximize protection versus monitoring and repeating the bone density in 2 years reviewed.  Patient at this point prefers observation.  She is active, stable on her feet with no history of fractures.  She will call me if she changes her mind and wants to consider treatment.  I did discuss the risks of Prolia to include osteonecrosis of the jaw, atypical fracture, rashes and infection risks. 3. Mammography 2018.  Coming due for mammogram now and she will schedule.  Breast exam normal today. 4. Colonoscopy 2012.  Repeat at their recommended interval. 5. Health maintenance.  No routine lab work done as patient does this elsewhere.  Follow-up 1 year, sooner as needed.   Anastasio Auerbach MD, 11:27 AM 11/04/2018

## 2018-11-04 NOTE — Patient Instructions (Signed)
Follow-up in 1 year for exam, sooner if any issues

## 2018-11-04 NOTE — Addendum Note (Signed)
Addended by: Nelva Nay on: 11/04/2018 11:38 AM   Modules accepted: Orders

## 2018-11-06 LAB — PAP IG W/ RFLX HPV ASCU

## 2018-11-16 ENCOUNTER — Ambulatory Visit: Payer: Medicare Other | Admitting: Internal Medicine

## 2018-11-16 ENCOUNTER — Encounter: Payer: Self-pay | Admitting: Internal Medicine

## 2018-11-16 VITALS — BP 124/66 | HR 59 | Ht 64.0 in | Wt 151.6 lb

## 2018-11-16 DIAGNOSIS — I471 Supraventricular tachycardia: Secondary | ICD-10-CM | POA: Diagnosis not present

## 2018-11-16 DIAGNOSIS — R55 Syncope and collapse: Secondary | ICD-10-CM | POA: Diagnosis not present

## 2018-11-16 DIAGNOSIS — I10 Essential (primary) hypertension: Secondary | ICD-10-CM

## 2018-11-16 DIAGNOSIS — E782 Mixed hyperlipidemia: Secondary | ICD-10-CM

## 2018-11-16 NOTE — Progress Notes (Addendum)
Cardiology Office Note    Date:  16/12/958   ID:  Monica Curry, DOB [birth year], MRN [id number]  PCP:  Unk Pinto, MD  Cardiologist: Dorris Carnes, MD  F/U of dizzienss/ presyncope   History of Presewnt Illness:  Monica Curry is a 84 y.o. female who I saw in Feb 2019 for evaluation of presyncope once while driving and the other while having her teeth cleaned at the dentist.  Patient had a TIA in November 2018 and has residual expressive a aphasia.  Blood pressure was high in the office and patient was set up for an event monitor.  This has showed SVT(short burst)  and she was placed on diltiazem 120 mg daily. 2D echo 10/2017 showed normal LVEF 55 to 60% with grade 1 DD, moderately dilated left atrium no source of emboli identified.  The pt was seen by Gerrianne Scale in April and May 2019   Patient wanted to stay on the increased Inderal 40 mg 4 times daily because it helps her essential tremors.  She did not want to try diltiazem.  She also had some chest pain that she thought was secondary to fatigue after a long night of travel and then volunteering at [facility].  Stat troponin and d-dimer were normal.  She was having left leg swelling and with her history of PE Doppler was ordered and was negative for DVT.  Nuclear stress test LVEF 76% normal study hyperdynamic LV EF 65%.   The pt says in early November she had another episode of heart racing and dizziness   It was not as bad as previous spells   Felt heart racing   No syncope    She was sitting at the time    Past Medical History:  Diagnosis Date  . C. difficile colitis   . Cancer [facility])    Endometrial cancer  . Essential and other specified forms of tremor   . Osteoporosis 11/2017   T score -2.7 stable from prior DEXA 2016  . Personal history of venous thrombosis and embolism 1997  . Stroke ([place])   . Thrush   . TIA (transient ischemic attack)     Past Surgical History:  Procedure Laterality Date  . ABDOMINAL  HYSTERECTOMY  1997  . APPENDECTOMY    . [place]  . OOPHORECTOMY     BSO  . TONSILLECTOMY AND ADENOIDECTOMY      Current Medications: Current Meds  Medication Sig  . aspirin EC 325 MG EC tablet Take 1 tablet (325 mg total) by mouth daily.  Marland Kitchen atorvastatin (LIPITOR) 10 MG tablet Take 10 mg by mouth daily.  . Calcium-Magnesium-Vitamin D 300-20-200 MG-MG-UNIT CHEW Chew 1 tablet daily by mouth.   . Cholecalciferol (VITAMIN D3) 5000 units TABS Take 1 tablet daily by mouth.  . clonazePAM (KLONOPIN) 0.5 MG tablet Take 1 pill in AM, 1/2 pill at noon and 1/2 pill at night.  Marland Kitchen MAGNESIUM PO Take by mouth.  . propranolol (INDERAL) 40 MG tablet Take 1 tablet 4 x / day with meals & Bedtime for BP & Tremor  . vitamin B-12 (CYANOCOBALAMIN) 500 MCG tablet Take 500 mcg by mouth daily.     Allergies:   Codeine; Meloxicam; Mysoline [primidone]; Pneumovax 23 [pneumococcal vac polyvalent]; and Toradol [ketorolac tromethamine]   Social History   Socioeconomic History  . Marital status: Widowed    Spouse name: Not on file  . Number of children: Not on file  . Years of education: Not  on file  . Highest education level: Not on file  Occupational History  . Not on file  Social Needs  . Financial resource strain: Not on file  . Food insecurity:    Worry: Not on file    Inability: Not on file  . Transportation needs:    Medical: Not on file    Non-medical: Not on file  Tobacco Use  . Smoking status: Never Smoker  . Smokeless tobacco: Never Used  Substance and Sexual Activity  . Alcohol use: Yes    Alcohol/week: 0.0 standard drinks    Comment: ocassionally  . Drug use: No  . Sexual activity: Never    Birth control/protection: Post-menopausal, Surgical    Comment: HYST-1st intercourse 84 yo-Fewer than 5 partners  Lifestyle  . Physical activity:    Days per week: Not on file    Minutes per session: Not on file  . Stress: Not on file  Relationships  . Social connections:     Talks on phone: Not on file    Gets together: Not on file    Attends religious service: Not on file    Active member of club or organization: Not on file    Attends meetings of clubs or organizations: Not on file    Relationship status: Not on file  Other Topics Concern  . Not on file  Social History Narrative  . Not on file     Family History:  The patient's family history includes Cancer in her brother, brother, father, and mother; Diabetes in her sister; Heart disease in her brother, brother, brother, sister, and sister; Hypertension in her sister; Kidney failure in her brother and sister.   ROS:   Please see the history of present illness.    Review of Systems  Constitution: Negative.  HENT: Negative.   Eyes: Negative.   Cardiovascular: Positive for dyspnea on exertion and leg swelling.  Respiratory: Negative.   Hematologic/Lymphatic: Negative.   Musculoskeletal: Negative.  Negative for joint pain.  Gastrointestinal: Negative.   Genitourinary: Negative.   Neurological: Negative.    All other systems reviewed and are negative.   PHYSICAL EXAM:   VS:  BP 124/66   Pulse (!) 59   Ht 5\' 4"  (1.626 m)   Wt 151 lb 9.6 oz (68.8 kg)   SpO2 99%   BMI 26.02 kg/m   Physical Exam  GEN: Well nourished, well developed, in no acute distress  Neck: JVP is normal  NO, carotid bruits, or masses Cardiac:RRR; no murmurs, rubs, or gallops  Respiratory:  clear to auscultation bilaterally, normal work of breathing GI: soft, nontender, nondistended, + BS Ext: without cyanosis, clubbing, or edema, Good distal pulses bilaterally Neuro:  Alert and Oriented x 3 Psych: euthymic mood, full affect  Wt Readings from Last 3 Encounters:  11/16/18 151 lb 9.6 oz (68.8 kg)  11/04/18 151 lb (68.5 kg)  09/01/18 153 lb 9.6 oz (69.7 kg)      Studies/Labs Reviewed:   EKG:  EKG is not ordered today.   Recent Labs: 09/01/2018: ALT 15; BUN 24; Creat 1.26; Hemoglobin 13.4; Magnesium 1.8; Platelets 248;  Potassium 4.5; Sodium 137; TSH 1.78   Lipid Panel    Component Value Date/Time   CHOL 125 09/01/2018 1619   TRIG 146 09/01/2018 1619   HDL 45 (L) 09/01/2018 1619   CHOLHDL 2.8 09/01/2018 1619   VLDL 24 10/24/2017 0629   LDLCALC 57 09/01/2018 1619    Additional studies/ records that were reviewed today  include:  Nuclear stress test 04/23/2018 Study Highlights      Nuclear stress EF: 76%.  There was no ST segment deviation noted during stress.  No T wave inversion was noted during stress.  The study is normal.  This is a low risk study.  The left ventricular ejection fraction is hyperdynamic (>65%).   Low risk stress nuclear study with normal perfusion and normal left ventricular regional and global systolic function.      Lower extremity venous Doppler 04/21/2018 Final Interpretation: Right: No evidence of common femoral vein obstruction. Normal reflux times were noted in the common femoral vein.  Left: No evidence of deep vein thrombosis in the lower extremity. No indirect evidence of obstruction proximal to the inguinal ligament.  Probable Baker's cyst, measuring .99 x .53 x 1.0 cm.     2D echo 10/24/2017 Study Conclusions   - Left ventricle: The cavity size was normal. Systolic function was   normal. The estimated ejection fraction was in the range of 55%   to 60%. Wall motion was normal; there were no regional wall   motion abnormalities. Doppler parameters are consistent with   abnormal left ventricular relaxation (grade 1 diastolic   dysfunction). - Mitral valve: There was mild regurgitation. - Left atrium: The atrium was moderately dilated.   Impressions:   - No cardiac source of emboli was indentified.     Assessment  1  Presyncope  One speill in Novmeber    Self limted   No severe      WOuld contine to follow   Keep on inderall   Infrequent spells    Watch for dizzy signs   P  Essential hypertension blood pressure well controlled  Chest pain  Pt denies     Hyperlipidemia   Will check lpids     History of DVT and PE after hysterectomy on aspirin  F/U in June 2020     Medication Adjustments/Labs and Tests Ordered: Current medicines are reviewed at length with the patient today.  Concerns regarding medicines are outlined above.  Medication changes, Labs and Tests ordered today are listed in the Patient Instructions below. There are no Patient Instructions on file for this visit.   Signed, Dorris Carnes, MD  11/16/2018 4:30 PM    [address], [place], [place]  [address] Phone: [phone]; Fax: [phone]

## 2018-11-16 NOTE — Patient Instructions (Signed)
Medication Instructions:  No change If you need a refill on your cardiac medications before your next appointment, please call your pharmacy.   Lab work: none If you have labs (blood work) drawn today and your tests are completely normal, you will receive your results only by: Marland Kitchen MyChart Message (if you have MyChart) OR . A paper copy in the mail If you have any lab test that is abnormal or we need to change your treatment, we will call you to review the results.  Testing/Procedures: none  Follow-Up: At [facility], you and your health needs are our priority.  As part of our continuing mission to provide you with exceptional heart care, we have created designated Provider Care Teams.  These Care Teams include your primary Cardiologist (physician) and Advanced Practice Providers (APPs -  Physician Assistants and Nurse Practitioners) who all work together to provide you with the care you need, when you need it. You will need a follow up appointment in:  7-9 months.  Please call our office 2 months in advance to schedule this appointment.  You may see Dorris Carnes, MD or one of the following Advanced Practice Providers on your designated Care Team: Richardson Dopp, PA-C [place], [place] . Daune Perch, NP  Any Other Special Instructions Will Be Listed Below (If Applicable).

## 2018-11-26 LAB — HM MAMMOGRAPHY

## 2018-12-03 ENCOUNTER — Other Ambulatory Visit: Payer: Medicare Other

## 2018-12-03 ENCOUNTER — Other Ambulatory Visit: Payer: Self-pay | Admitting: Internal Medicine

## 2018-12-03 DIAGNOSIS — R7309 Other abnormal glucose: Secondary | ICD-10-CM

## 2018-12-03 DIAGNOSIS — E559 Vitamin D deficiency, unspecified: Secondary | ICD-10-CM

## 2018-12-03 DIAGNOSIS — Z79899 Other long term (current) drug therapy: Secondary | ICD-10-CM

## 2018-12-03 DIAGNOSIS — E782 Mixed hyperlipidemia: Secondary | ICD-10-CM

## 2018-12-03 DIAGNOSIS — I10 Essential (primary) hypertension: Secondary | ICD-10-CM

## 2018-12-04 LAB — VITAMIN D 25 HYDROXY (VIT D DEFICIENCY, FRACTURES): VIT D 25 HYDROXY: 48 ng/mL (ref 30–100)

## 2018-12-04 LAB — CBC WITH DIFFERENTIAL/PLATELET
Absolute Monocytes: 477 cells/uL (ref 200–950)
BASOS ABS: 42 {cells}/uL (ref 0–200)
Basophils Relative: 0.8 %
EOS ABS: 170 {cells}/uL (ref 15–500)
EOS PCT: 3.2 %
HCT: 39.3 % (ref 35.0–45.0)
Hemoglobin: 13.5 g/dL (ref 11.7–15.5)
Lymphs Abs: 2210 cells/uL (ref 850–3900)
MCH: 30.1 pg (ref 27.0–33.0)
MCHC: 34.4 g/dL (ref 32.0–36.0)
MCV: 87.7 fL (ref 80.0–100.0)
MONOS PCT: 9 %
MPV: 10.4 fL (ref 7.5–12.5)
Neutro Abs: 2401 cells/uL (ref 1500–7800)
Neutrophils Relative %: 45.3 %
PLATELETS: 262 10*3/uL (ref 140–400)
RBC: 4.48 10*6/uL (ref 3.80–5.10)
RDW: 13.8 % (ref 11.0–15.0)
Total Lymphocyte: 41.7 %
WBC: 5.3 10*3/uL (ref 3.8–10.8)

## 2018-12-04 LAB — COMPLETE METABOLIC PANEL WITH GFR
AG Ratio: 1.6 (calc) (ref 1.0–2.5)
ALT: 11 U/L (ref 6–29)
AST: 15 U/L (ref 10–35)
Albumin: 4.1 g/dL (ref 3.6–5.1)
Alkaline phosphatase (APISO): 42 U/L (ref 33–130)
BILIRUBIN TOTAL: 0.4 mg/dL (ref 0.2–1.2)
BUN/Creatinine Ratio: 18 (calc) (ref 6–22)
BUN: 19 mg/dL (ref 7–25)
CALCIUM: 9.4 mg/dL (ref 8.6–10.4)
CHLORIDE: 104 mmol/L (ref 98–110)
CO2: 27 mmol/L (ref 20–32)
Creat: 1.08 mg/dL — ABNORMAL HIGH (ref 0.60–0.88)
GFR, EST AFRICAN AMERICAN: 54 mL/min/{1.73_m2} — AB (ref >=60)
GFR, EST NON AFRICAN AMERICAN: 47 mL/min/{1.73_m2} — AB (ref >=60)
GLOBULIN: 2.5 g/dL (ref 1.9–3.7)
Glucose, Bld: 81 mg/dL (ref 65–99)
POTASSIUM: 4.4 mmol/L (ref 3.5–5.3)
SODIUM: 138 mmol/L (ref 135–146)
Total Protein: 6.6 g/dL (ref 6.1–8.1)

## 2018-12-04 LAB — HEMOGLOBIN A1C
EAG (MMOL/L): 6.2 (calc)
HEMOGLOBIN A1C: 5.5 %{Hb} (ref <5.7)
MEAN PLASMA GLUCOSE: 111 (calc)

## 2018-12-04 LAB — LIPID PANEL
Cholesterol: 119 mg/dL (ref <200)
HDL: 43 mg/dL — AB (ref >50)
LDL CHOLESTEROL (CALC): 50 mg/dL
NON-HDL CHOLESTEROL (CALC): 76 mg/dL (ref <130)
Total CHOL/HDL Ratio: 2.8 (calc) (ref <5.0)
Triglycerides: 179 mg/dL — ABNORMAL HIGH (ref <150)

## 2018-12-04 LAB — TSH: TSH: 1.96 mIU/L (ref 0.40–4.50)

## 2018-12-04 LAB — INSULIN, RANDOM: INSULIN: 11.9 u[IU]/mL (ref 2.0–19.6)

## 2018-12-04 LAB — MAGNESIUM: MAGNESIUM: 1.8 mg/dL (ref 1.5–2.5)

## 2018-12-06 ENCOUNTER — Encounter: Payer: Self-pay | Admitting: Internal Medicine

## 2018-12-06 NOTE — Progress Notes (Signed)
This very nice 84 y.o. WWF presents for 6 month follow up with HTN, HLD, Essential Tremor,  Pre-Diabetes and Vitamin D Deficiency. Patient was started on Propranolol in Mar 2018 for her Essential Tremor.      Patient is treated for HTN & BP has been controlled at home. Today's BP is at goal - 128/72.  In May, she had a Negative Nuclear Stress Test. Patient has hx/o CVA w/ an exp aphasia (resolved) in Nov 2018 and has been followed since by Dr Dorris Carnes.  Patient is advised to continue her LD bASA 81 mg. Patient has had no complaints of any cardiac type chest pain, palpitations, dyspnea / orthopnea / PND, dizziness, claudication, or dependent edema.     Hyperlipidemia is controlled with diet & meds. Patient denies myalgias or other med SE's. Recent Lipids were at goal albeit slightly elevated Trig's: Lab Results  Component Value Date   CHOL 119 12/03/2018   HDL 43 (L) 12/03/2018   LDLCALC 50 12/03/2018   TRIG 179 (H) 12/03/2018   CHOLHDL 2.8 12/03/2018      Also, the patient has history of PreDiabetes  (A1c 5.7% / 2016)  and has had no symptoms of reactive hypoglycemia, diabetic polys, paresthesias or visual blurring.  Last A1c was Normal & at goal: Lab Results  Component Value Date   HGBA1C 5.5 12/03/2018      Further, the patient also has history of Vitamin D Deficiency and supplements vitamin D without any suspected side-effects. Last vitamin D was still low: Lab Results  Component Value Date   VD25OH 48 12/03/2018   Current Outpatient Medications on File Prior to Visit  Medication Sig  . aspirin EC 325 MG EC tablet Take 1 tablet (325 mg total) by mouth daily.  Marland Kitchen atorvastatin (LIPITOR) 10 MG tablet Take 10 mg by mouth daily.  . Calcium-Magnesium-Vitamin D 300-20-200 MG-MG-UNIT CHEW Chew 1 tablet daily by mouth.   . Cholecalciferol (VITAMIN D3) 5000 units TABS Take 1 tablet daily by mouth.  . clonazePAM (KLONOPIN) 0.5 MG tablet Take 1 pill in AM, 1/2 pill at noon and 1/2 pill at  night.  Marland Kitchen MAGNESIUM PO Take by mouth.  . propranolol (INDERAL) 40 MG tablet Take 1 tablet 4 x / day with meals & Bedtime for BP & Tremor  . vitamin B-12 (CYANOCOBALAMIN) 500 MCG tablet Take 500 mcg by mouth daily.   No current facility-administered medications on file prior to visit.    Allergies  Allergen Reactions  . Codeine Hives  . Meloxicam Nausea Only    Gastritis and nausea  . Mysoline [Primidone]   . Pneumovax 23 [Pneumococcal Vac Polyvalent]     Red, raised area.  Knot at site of injection  . Toradol [Ketorolac Tromethamine] Swelling    Localized redness at injection site.     PMHx:   Past Medical History:  Diagnosis Date  . C. difficile colitis   . Cancer [facility])    Endometrial cancer  . Essential and other specified forms of tremor   . Osteoporosis 11/2017   T score -2.7 stable from prior DEXA 2016  . Personal history of venous thrombosis and embolism 1997  . Stroke ([place])   . Thrush   . TIA (transient ischemic attack)    Immunization History  Administered Date(s) Administered  . H1N1 01/03/2009  . Influenza Whole 09/23/2013  . Influenza, High Dose Seasonal PF 09/06/2014, 08/23/2015, 08/21/2016, 08/28/2017, 09/01/2018  . PPD Test 03/29/2014  .  Pneumococcal Polysaccharide-23 03/29/2014  . Pneumococcal-Unspecified 12/09/2002  . Td 03/23/2013  . Zoster 12/09/2006   Past Surgical History:  Procedure Laterality Date  . ABDOMINAL HYSTERECTOMY  1997  . APPENDECTOMY    . [place]  . OOPHORECTOMY     BSO  . TONSILLECTOMY AND ADENOIDECTOMY     FHx:    Reviewed / unchanged  SHx:    Reviewed / unchanged   Systems Review:  Constitutional: Denies fever, chills, wt changes, headaches, insomnia, fatigue, night sweats, change in appetite. Eyes: Denies redness, blurred vision, diplopia, discharge, itchy, watery eyes.  ENT: Denies discharge, congestion, post nasal drip, epistaxis, sore throat, earache, hearing loss, dental pain, tinnitus, vertigo, sinus  pain, snoring.  CV: Denies chest pain, palpitations, irregular heartbeat, syncope, dyspnea, diaphoresis, orthopnea, PND, claudication or edema. Respiratory: denies cough, dyspnea, DOE, pleurisy, hoarseness, laryngitis, wheezing.  Gastrointestinal: Denies dysphagia, odynophagia, heartburn, reflux, water brash, abdominal pain or cramps, nausea, vomiting, bloating, diarrhea, constipation, hematemesis, melena, hematochezia  or hemorrhoids. Genitourinary: Denies dysuria, frequency, urgency, nocturia, hesitancy, discharge, hematuria or flank pain. Musculoskeletal: Denies arthralgias, myalgias, stiffness, jt. swelling, pain, limping or strain/sprain.  Skin: Denies pruritus, rash, hives, warts, acne, eczema or change in skin lesion(s). Neuro: No weakness, tremor, incoordination, spasms, paresthesia or pain. Psychiatric: Denies confusion, memory loss or sensory loss. Endo: Denies change in weight, skin or hair change.  Heme/Lymph: No excessive bleeding, bruising or enlarged lymph nodes.  Physical Exam  BP 128/72   Pulse 60   Temp (!) 97.5 F (36.4 C)   Resp 16   Ht 5\' 4"  (1.626 m)   Wt 153 lb 6.4 oz (69.6 kg)   BMI 26.33 kg/m   Appears  well nourished, well groomed  and in no distress.  Eyes: PERRLA, EOMs, conjunctiva no swelling or erythema. Sinuses: No frontal/maxillary tenderness ENT/Mouth: EAC's clear, TM's nl w/o erythema, bulging. Nares clear w/o erythema, swelling, exudates. Oropharynx clear without erythema or exudates. Oral hygiene is good. Tongue normal, non obstructing. Hearing intact.  Neck: Supple. Thyroid not palpable. Car 2+/2+ without bruits, nodes or JVD. Chest: Respirations nl with BS clear & equal w/o rales, rhonchi, wheezing or stridor.  Cor: Heart sounds normal w/ regular rate and rhythm without sig. murmurs, gallops, clicks or rubs. Peripheral pulses normal and equal  without edema.  Abdomen: Soft & bowel sounds normal. Non-tender w/o guarding, rebound, hernias, masses or  organomegaly.  Lymphatics: Unremarkable.  Musculoskeletal: Full ROM all peripheral extremities, joint stability, 5/5 strength and normal gait.  Skin: Warm, dry without exposed rashes, lesions or ecchymosis apparent.  Neuro: Cranial nerves intact, reflexes equal bilaterally. Sensory-motor testing grossly intact. Tendon reflexes grossly intact.  Pysch: Alert & oriented x 3.  Insight and judgement nl & appropriate. No ideations.  Assessment and Plan:  1. Essential hypertension  - Continue medication, monitor blood pressure at home.  - Continue DASH diet.  Reminder to go to the ER if any CP,  SOB, nausea, dizziness, severe HA, changes vision/speech.  - CBC with Differential/Platelet - COMPLETE METABOLIC PANEL WITH GFR - Magnesium - TSH  2. Hyperlipidemia, mixed  - Continue diet/meds, exercise,& lifestyle modifications.  - Continue monitor periodic cholesterol/liver & renal functions   - Lipid panel - TSH  3. Abnormal glucose  - Continue diet, exercise,  - lifestyle modifications.  - Monitor appropriate labs.  - Hemoglobin A1c - Insulin, random  4. Vitamin D deficiency  - Continue supplementation.  - VITAMIN D 25 Hydroxyl  5. Prediabetes  -  Hemoglobin A1c - Insulin, random  6. History of TIA (transient ischemic attack)  7. Medication management  - CBC with Differential/Platelet - COMPLETE METABOLIC PANEL WITH GFR - Magnesium - Lipid panel - TSH - Hemoglobin A1c - Insulin, random - VITAMIN D 25 Hydroxyl        Discussed  regular exercise, BP monitoring, weight control to achieve/maintain BMI less than 25 and discussed med and SE's. Recommended labs to assess and monitor clinical status with further disposition pending results of labs. Over 30 minutes of exam, counseling, chart review was performed.

## 2018-12-06 NOTE — Patient Instructions (Signed)

## 2018-12-07 ENCOUNTER — Encounter: Payer: Self-pay | Admitting: *Deleted

## 2018-12-07 ENCOUNTER — Ambulatory Visit: Payer: Medicare Other | Admitting: Internal Medicine

## 2018-12-07 VITALS — BP 128/72 | HR 60 | Temp 97.5°F | Resp 16 | Ht 64.0 in | Wt 153.4 lb

## 2018-12-07 DIAGNOSIS — I10 Essential (primary) hypertension: Secondary | ICD-10-CM

## 2018-12-07 DIAGNOSIS — E559 Vitamin D deficiency, unspecified: Secondary | ICD-10-CM

## 2018-12-07 DIAGNOSIS — E782 Mixed hyperlipidemia: Secondary | ICD-10-CM

## 2018-12-07 DIAGNOSIS — R7309 Other abnormal glucose: Secondary | ICD-10-CM

## 2018-12-07 DIAGNOSIS — R7303 Prediabetes: Secondary | ICD-10-CM

## 2018-12-07 DIAGNOSIS — Z79899 Other long term (current) drug therapy: Secondary | ICD-10-CM

## 2018-12-07 DIAGNOSIS — Z8673 Personal history of transient ischemic attack (TIA), and cerebral infarction without residual deficits: Secondary | ICD-10-CM

## 2018-12-08 ENCOUNTER — Other Ambulatory Visit: Payer: Self-pay | Admitting: *Deleted

## 2018-12-08 MED ORDER — MAGNESIUM 250 MG PO TABS: ORAL_TABLET | ORAL | 0 refills | Status: DC

## 2018-12-08 MED ORDER — ATORVASTATIN CALCIUM 10 MG PO TABS: 10.0000 mg | ORAL_TABLET | Freq: Every day | ORAL | 1 refills | Status: DC

## 2018-12-14 ENCOUNTER — Encounter: Payer: Self-pay | Admitting: *Deleted

## 2018-12-30 LAB — HM MAMMOGRAPHY

## 2019-01-14 ENCOUNTER — Telehealth: Payer: Self-pay | Admitting: Adult Health

## 2019-01-14 DIAGNOSIS — G250 Essential tremor: Secondary | ICD-10-CM

## 2019-01-14 NOTE — Telephone Encounter (Signed)
LMVM for pt that returned call.  ? What dose she is on 0.5 in am, 0.25 in noon and pm., increase tremors?  Last visit 07-2018.  Drug Registry checked 12-21-18 #60.

## 2019-01-14 NOTE — Telephone Encounter (Signed)
Pt is asking for a refill on her clonazePAM (KLONOPIN) 0.5 MG tablet [id number]  Pt is calling to inform that her tremors have increased and she wants to know if she can get a stronger dose on her clonazePAM (KLONOPIN) 0.5 MG tablet  Please call

## 2019-01-14 NOTE — Addendum Note (Signed)
Addended by: Brandon Melnick on: 01/14/2019 02:50 PM   Modules accepted: Orders

## 2019-01-15 NOTE — Telephone Encounter (Signed)
Called pt.  She stated that she has noted worsening tremors in both hands (more so after activity).  She is taking klonopin 0.5mg  1 tab in AM, 1/2 tab noon and pm, also propranolol 40mg  po TID and qhs (this with pcp).  Writing, applying makeup, passing plates more difficult.  R hand pointer finger more shaky (uses texting).  ? About her having PD (has friend who diag with essential tremor now with PD).  I made appt 01-18-19 at 0930 to eval and discuss.  Has appt 8/20 with MM/NP did not remove from schedule.

## 2019-01-18 ENCOUNTER — Encounter: Payer: Self-pay | Admitting: Neurology

## 2019-01-18 ENCOUNTER — Ambulatory Visit: Payer: Medicare Other | Admitting: Neurology

## 2019-01-18 VITALS — BP 155/80 | HR 62 | Ht 64.0 in | Wt 150.0 lb

## 2019-01-18 DIAGNOSIS — G250 Essential tremor: Secondary | ICD-10-CM | POA: Diagnosis not present

## 2019-01-18 MED ORDER — CLONAZEPAM 0.5 MG PO TABS: ORAL_TABLET | ORAL | 5 refills | Status: DC

## 2019-01-18 NOTE — Patient Instructions (Signed)
Your tremor has slowly progressed with time. As discussed, we will request DBS consultation with Dr. Carles Collet at [facility] here in [place]. We will keep your meds the same. Follow-up with [facility] in August as scheduled.

## 2019-01-18 NOTE — Progress Notes (Signed)
Subjective:    Patient ID: Monica Curry is a 85 y.o. female.  HPI     Interim history:   Monica Curry is an 85 year old right-handed woman with an underlying medical history of uterine cancer, anxiety, status post hysterectomy and tonsillectomy, and recent hospitalization for suspected TIA November 2018, who presents for follow-up consultation of her essential tremor. The patient is unaccompanied today and presents for sooner than scheduled appointment to discuss her tremor. I last saw her on 01/19/2018, at which time we talked about her recent hospitalization. Her tremors are stable. She was advised to continue with her current doses of propranolol of clonazepam. Of note, she was hospitalized from 10/23/2017 through 10/24/2017 for a transient episode of expressive aphasia. She had TIA/stroke workup. I reviewed the hospital records and test results. MRI brain showed no acute intracranial process, MRA head showed no significant intracranial large vessel occlusion or stenosis. She had a small left ICA aneurysm and small right ICA aneurysm. Echocardiogram showed EF of 55-60 with grade 1 diastolic dysfunction. Carotid Doppler studies showed no significant ICA stenosis, she had no therapy needs. LDL was 105, A1c was 5.5.    She was started on a statin but discontinued it.   When she has an episode of lightheadedness and near fainting she feels like she is going to fall but she has not fallen. She does not actually have an episode of actual loss of consciousness. Interestingly, her sister had similar episodes and eventually had a pacemaker placed. Patient reports that she has chest tightness and it feels like her heart stops for moment.   She was seen by Monica Curry in the interim on 07/14/2018, at which time she was advised to continue with her propranolol and clonazepam. Patient reported that her PCP had increased her beta blocker after her TIA admission due to blood pressure increase but she was  going to discuss reducing it back again.  Today, 01/18/2019 (all dictated new, as well as above notes, some dictation done in note pad or Word, outside of chart, may appear as copied): She reports some increase in stress. She helps take care of a friend, who is not doing so well. She has worried about the tremor being worse, some days are better than others. She had a consultation at [facility] years ago. She would be willing to seek consultation for DBS. She would like to stay in [place].   The patient's allergies, current medications, family history, past medical history, past social history, past surgical history and problem list were reviewed and updated as appropriate.    Previously (copied from previous notes for reference):    I saw her on 01/08/2017, at which time she was fairly stable and still indicated that clonazepam and Inderal were helpful. I suggested we continue with generic Inderal 20 mg 3 times a day and low-dose clonazepam 3 times a day.    She saw Monica Curry in the interim on 07/16/2017, at which time she was deemed stable and advised to maintain on her current medication regimen for symptomatic control for her essential tremor.    She saw Monica Curry in follow-up after her recent hospital admission for TIA concern on TIA admission on 01/08/2018 and I reviewed his note. She was advised to have a Holter monitor for 1 month, she was advised to continue with adult size daily aspirin.    I saw her on 01/08/2016, at which time she felt she was doing quite well, stable with her tremor overall but  flareup notable when she was stressed out or when anxious. I suggested we continue with her current medication regimen for her tremors.   I saw her on 07/03/2015, at which time she reported having had C. difficile colitis in May 2016 and she went through 2 rounds of Flagyl, then unfortunately developed thrush, and was treated with nystatin for this, and reported residual problems with coated  tongue. Tremor wise she felt stable and had no side effects on her medications. I suggested she continue with the same doses of propranolol and low-dose clonazepam.   I saw her on 01/02/2015, at which time she reported doing well. She had tried the propranolol long-acting at one time in the past, but it did not work as well. She was prescribed trazodone 150 mg by her PCP. She tried 1/2 pill for about 2 weeks, but it did not help and made her drowsy during the day. She tried melatonin with no significant success, and she bought a white noise machine and stopped the melatonin. She had a reaction to Pneumovax in 2015 and did take the flu shot. I suggested she continue low-dose clonazepam and kept her on Inderal generic 20 mg 3 times a day.   I saw her on 06/30/14, at which time she reported doing well fro the tremor standpoint. She was having trouble going to sleep and staying asleep. She was worried about her brother who had heart surgery and was in the hospital. I kept her on propranolol and clonazepam.   I saw her on 12/31/2013, at which time I felt she was stable. I did not change her medications. I suggested a routine six-month checkup. She reported volunteering at the [facility]. She felt stable. She reported having a cousin with Parkinson's disease and one aunt with voice tremor. She had a bone density scan on 10/14/13 and it showed stable findings.    I first met her on 07/02/2013, at which time I felt her exam was stable and it did not change her medications. I had reviewed recent blood work and did not have any new test at the time. I renewed her prescriptions for propranolol and clonazepam at the time.     She previously followed with Dr. Morene Curry and last saw him on 12/31/2012, and which time he started her on propranolol.   She felt the propranolol 60 mg long-acting once daily was not as effective and was switched to propranolol 20 mg tid, clonazepam 0.5 mg tablet one in the morning, 0.25  mg at noon and 0.25 mg in the evening. Activity makes the tremor worse. She does note an improvement with alcohol and takes only one propranolol when she has had a drink. She drinks occasionally.   She has a history of tremors for many years, treated with clonazepam for a number of years, but tremor exacerbated in 1997 after her pulmonary embolus. She had withdrawal symptoms coming off of clonazepam. She had been on Mysoline but did not tolerate it. She has seen several other doctors for the tremor. TSH was negative in the past. She was tried on methazolamide without benefit. X ray lumbar spine from 03/24/13 showed T9 compression fracture, age indeterminate.  Her Past Medical History Is Significant For: Past Medical History:  Diagnosis Date  . C. difficile colitis   . Cancer [facility])    Endometrial cancer  . Essential and other specified forms of tremor   . Osteoporosis 11/2017   T score -2.7 stable from prior DEXA 2016  .  Personal history of venous thrombosis and embolism 1997  . Stroke ([place])   . Thrush   . TIA (transient ischemic attack)     Her Past Surgical History Is Significant For: Past Surgical History:  Procedure Laterality Date  . ABDOMINAL HYSTERECTOMY  1997  . APPENDECTOMY    . [place]  . OOPHORECTOMY     BSO  . TONSILLECTOMY AND ADENOIDECTOMY      Her Family History Is Significant For: Family History  Problem Relation Age of Onset  . Cancer Mother        Unknown type  . Cancer Father        Bone  . Cancer Brother        Prostate  . Heart disease Brother   . Cancer Brother        Prostate  . Heart disease Brother   . Kidney failure Brother   . Heart disease Sister   . Heart disease Sister   . Kidney failure Sister   . Diabetes Sister   . Hypertension Sister   . Heart disease Brother     Her Social History Is Significant For: Social History   Socioeconomic History  . Marital status: Widowed    Spouse name: Not on file  . Number of  children: Not on file  . Years of education: Not on file  . Highest education level: Not on file  Occupational History  . Not on file  Social Needs  . Financial resource strain: Not on file  . Food insecurity:    Worry: Not on file    Inability: Not on file  . Transportation needs:    Medical: Not on file    Non-medical: Not on file  Tobacco Use  . Smoking status: Never Smoker  . Smokeless tobacco: Never Used  Substance and Sexual Activity  . Alcohol use: Yes    Alcohol/week: 0.0 standard drinks    Comment: ocassionally  . Drug use: No  . Sexual activity: Never    Birth control/protection: Post-menopausal, Surgical    Comment: HYST-1st intercourse 85 yo-Fewer than 5 partners  Lifestyle  . Physical activity:    Days per week: Not on file    Minutes per session: Not on file  . Stress: Not on file  Relationships  . Social connections:    Talks on phone: Not on file    Gets together: Not on file    Attends religious service: Not on file    Active member of club or organization: Not on file    Attends meetings of clubs or organizations: Not on file    Relationship status: Not on file  Other Topics Concern  . Not on file  Social History Narrative  . Not on file    Her Allergies Are:  Allergies  Allergen Reactions  . Codeine Hives  . Meloxicam Nausea Only    Gastritis and nausea  . Mysoline [Primidone]   . Pneumovax 23 [Pneumococcal Vac Polyvalent]     Red, raised area.  Knot at site of injection  . Toradol [Ketorolac Tromethamine] Swelling    Localized redness at injection site.    :   Her Current Medications Are:  Outpatient Encounter Medications as of 01/18/2019  Medication Sig  . atorvastatin (LIPITOR) 10 MG tablet Take 1 tablet (10 mg total) by mouth daily.  . Calcium-Magnesium-Vitamin D 300-20-200 MG-MG-UNIT CHEW Chew 1 tablet daily by mouth.   . Cholecalciferol (VITAMIN D3) 5000 units TABS Take  1 tablet daily by mouth.  . clonazePAM (KLONOPIN) 0.5 MG  tablet Take 1 pill in AM, 1/2 pill at noon and 1/2 pill at night.  . Magnesium 250 MG TABS Takes 1 tablet daily.  . propranolol (INDERAL) 40 MG tablet Take 1 tablet 4 x / day with meals & Bedtime for BP & Tremor  . vitamin B-12 (CYANOCOBALAMIN) 500 MCG tablet Take 500 mcg by mouth daily.   No facility-administered encounter medications on file as of 01/18/2019.   :  Review of Systems:  Out of a complete 14 point review of systems, all are reviewed and negative with the exception of these symptoms as listed below: Review of Systems  Neurological:       Pt presents today to discuss her worsening tremors. Pt also reports that she needs a refill on her klonopin.    Objective:  Neurological Exam  Physical Exam Physical Examination:   Vitals:   01/18/19 0911  BP: (!) 155/80  Pulse: 62    General Examination: The patient is a very pleasant 85 y.o. female in no acute distress. She appears well-developed and well-nourished and well groomed.   HEENT:Normocephalic, atraumatic, pupils are equal, round and reactive to light and accommodation. Extraocular tracking is good without limitation to gaze excursion or nystagmus noted. Normal smooth pursuit is noted. Hearing is grossly intact. Face is symmetric with normal facial animation. There isa mild lower lip and jaw tremor, stable, minimal voicetremor, all stable. Neck is supple with full range of passive and active motion. Oropharynx exam reveals:mildmouth dryness, adequatedental hygiene and mildairway crowding. Mallampati is class II. Tongue protrudes centrally and palate elevates symmetrically.   Chest:Clear to auscultation without wheezing, rhonchi or crackles noted.  Heart:S1+S2+0, regular and normal without murmurs, rubs or gallops noted.   Abdomen:Soft, non-tender and non-distended.  Extremities:There isnopitting edema in the distal lower extremities bilaterally.  Skin: Warm and dry without trophic changes  noted.  Musculoskeletal: exam reveals no obvious joint deformities, tenderness or joint swelling or erythema.   Neurologically:  Mental status: The patient is awake, alert and oriented in all 4 spheres.Herimmediate and remote memory, attention, language skills and fund of knowledge are appropriate. There is no evidence of aphasia, agnosia, apraxia or anomia. Speech is clear with normal prosody and enunciation. Thought process is linear. Mood is normaland affect is normal.  Cranial nerves II - XII are as described above under HEENT exam.  Motor exam: Normal bulk, strength and tone is noted. There is no drift,or obvious resting tremor, she has a slight intermittent right index finger resting tremor. She has a bilateral postural and action tremor which appears to be fairly stable. Reflexes are 2-3+ throughout. Fine motor skills are minimally impaired but could be intact for age, no lateralization, no obvious decrement in amplitude noted. Cerebellar testing: No dysmetria or intention tremor. There is no truncal or gait ataxia.  Sensory exam: intact to light touch in the upper and lower extremities.  Gait, station and balance:Shestands easily. No veering to one side is noted. No leaning to one side is noted. Posture is age-appropriate and stance is narrow based. Gait showsnormalstride length and normalpace. No problems turning are noted. Turns well, good balance, stable for age.  Assessmentand Plan:   In summary, Kilee Hedding is a very pleasant 85 year old female with an underlying medical history of anxiety, osteoporosis, history of DVTand PE, hypertension, hyperlipidemia, and vitamin D deficiency,s/p C section, Appendectomy, hysterectomy, tonsillectomy, BSO,who presents for follow-up consultation of her essential tremor.  She presents for a sooner than scheduled appointment because of worsening tremor reported. She has had overall slow progression with time. She has been  on symptomatic treatment with generic Inderal and low-dose generic Klonopin. She is advised to continue with her medications. She had hospitalization in November 2018 for TIA concern with full workup done. She is agreeable to seek consultation for DBS. I talked to her a little bit about it. She would like to stay within [place]. I made a referral to Dr. Carles Collet. I renewed her clonazepam prescription. I advised her that I would not like to increase it at this time. She did not need any refills on her beta blocker. I suggested she keep her follow-up appointment in 6 months with [facility] as scheduled. I answered all her questions today and she was in agreement. I spent 20 minutes in total face-to-face time with the patient, more than 50% of which was spent in counseling and coordination of care, reviewing test results, reviewing medication and discussing or reviewing the diagnosis of ET, its prognosis and treatment options. Pertinent laboratory and imaging test results that were available during this visit with the patient were reviewed by me and considered in my medical decision making (see chart for details).

## 2019-01-20 ENCOUNTER — Other Ambulatory Visit: Payer: Self-pay | Admitting: Adult Health

## 2019-01-20 DIAGNOSIS — G250 Essential tremor: Secondary | ICD-10-CM

## 2019-02-02 NOTE — Progress Notes (Deleted)
Subjective:   Monica Curry was seen in consultation in the movement disorder clinic at the request of Unk Pinto, MD.  The evaluation is for tremor.  Patient has been a longtime patient of [facility], first seeing Dr. Erling Cruz and most recently with Dr. Rexene Alberts.  She apparently was seen by Dr. Gilford Rile at [facility] in 2001, but those records are unavailable.  She has had tremor since the 1990s.  Early on, she tried primidone but did not tolerate it (unclear side effect).  She also has been on clonazepam for quite some time.  She is currently on 0.5 mg, 1 tablet in the morning, half a tablet at noon and half a tablet at bedtime.  She has been on propranolol for many years.  The dosage has been increased over that time.  She is currently on tremor started approximately *** ago and involves the ***.  Tremor is most noticeable when ***.   There is *** family hx of tremor.    Affected by caffeine:  {yes no:314532} Affected by alcohol:  {yes no:314532} Affected by stress:  {yes no:314532} Affected by fatigue:  {yes no:314532} Spills soup if on spoon:  {yes no:314532} Spills glass of liquid if full:  {yes no:314532} Affects ADL's (tying shoes, brushing teeth, etc):  {yes no:314532}  Current/Previously tried tremor medications: ***Primidone, propranolol, clonazepam  Current medications that may exacerbate tremor:  ***  Outside reports reviewed: {Outside review:15817}.  I personally reviewed her November, 2018 MRI of the brain.  There was moderate small vessel disease.  Allergies  Allergen Reactions  . Codeine Hives  . Meloxicam Nausea Only    Gastritis and nausea  . Mysoline [Primidone]   . Pneumovax 23 [Pneumococcal Vac Polyvalent]     Red, raised area.  Knot at site of injection  . Toradol [Ketorolac Tromethamine] Swelling    Localized redness at injection site.      Outpatient Encounter Medications as of 02/08/2019  Medication Sig  . atorvastatin (LIPITOR) 10 MG tablet Take 1  tablet (10 mg total) by mouth daily.  . Calcium-Magnesium-Vitamin D 300-20-200 MG-MG-UNIT CHEW Chew 1 tablet daily by mouth.   . Cholecalciferol (VITAMIN D3) 5000 units TABS Take 1 tablet daily by mouth.  . clonazePAM (KLONOPIN) 0.5 MG tablet Take 1 pill in AM, 1/2 pill at noon and 1/2 pill at night.  . Magnesium 250 MG TABS Takes 1 tablet daily.  . propranolol (INDERAL) 40 MG tablet Take 1 tablet 4 x / day with meals & Bedtime for BP & Tremor  . vitamin B-12 (CYANOCOBALAMIN) 500 MCG tablet Take 500 mcg by mouth daily.   No facility-administered encounter medications on file as of 02/08/2019.     Past Medical History:  Diagnosis Date  . C. difficile colitis   . Cancer [facility])    Endometrial cancer  . Essential and other specified forms of tremor   . Osteoporosis 11/2017   T score -2.7 stable from prior DEXA 2016  . Personal history of venous thrombosis and embolism 1997  . Stroke ([place])   . Thrush   . TIA (transient ischemic attack)     Past Surgical History:  Procedure Laterality Date  . ABDOMINAL HYSTERECTOMY  1997  . APPENDECTOMY    . [place]  . OOPHORECTOMY     BSO  . TONSILLECTOMY AND ADENOIDECTOMY      Social History   Socioeconomic History  . Marital status: Widowed    Spouse name: Not on file  .  Number of children: Not on file  . Years of education: Not on file  . Highest education level: Not on file  Occupational History  . Not on file  Social Needs  . Financial resource strain: Not on file  . Food insecurity:    Worry: Not on file    Inability: Not on file  . Transportation needs:    Medical: Not on file    Non-medical: Not on file  Tobacco Use  . Smoking status: Never Smoker  . Smokeless tobacco: Never Used  Substance and Sexual Activity  . Alcohol use: Yes    Alcohol/week: 0.0 standard drinks    Comment: ocassionally  . Drug use: No  . Sexual activity: Never    Birth control/protection: Post-menopausal, Surgical    Comment: HYST-1st  intercourse 85 yo-Fewer than 5 partners  Lifestyle  . Physical activity:    Days per week: Not on file    Minutes per session: Not on file  . Stress: Not on file  Relationships  . Social connections:    Talks on phone: Not on file    Gets together: Not on file    Attends religious service: Not on file    Active member of club or organization: Not on file    Attends meetings of clubs or organizations: Not on file    Relationship status: Not on file  . Intimate partner violence:    Fear of current or ex partner: Not on file    Emotionally abused: Not on file    Physically abused: Not on file    Forced sexual activity: Not on file  Other Topics Concern  . Not on file  Social History Narrative  . Not on file    Family Status  Relation Name Status  . Mother  Deceased at age 37  . Father  Deceased at age 76  . Brother  Deceased  . Brother  Deceased  . Sister  Alive  . Sister  Alive  . MGM  Deceased  . MGF  Deceased  . PGM  Deceased  . PGF  Deceased  . Brother  Alive    Review of Systems ROS   Objective:   VITALS:  There were no vitals filed for this visit. Gen:  Appears stated age and in NAD. HEENT:  Normocephalic, atraumatic. The mucous membranes are moist. The superficial temporal arteries are without ropiness or tenderness. Cardiovascular: Regular rate and rhythm. Lungs: Clear to auscultation bilaterally. Neck: There are no carotid bruits noted bilaterally.  NEUROLOGICAL:  Orientation:  The patient is alert and oriented x 3.  Recent and remote memory are intact.  Attention span and concentration are normal.  Able to name objects and repeat without trouble.  Fund of knowledge is appropriate Cranial nerves: There is good facial symmetry. The pupils are equal round and reactive to light bilaterally. Fundoscopic exam reveals clear disc margins bilaterally. Extraocular muscles are intact and visual fields are full to confrontational testing. Speech is fluent and clear.  Soft palate rises symmetrically and there is no tongue deviation. Hearing is intact to conversational tone. Tone: Tone is good throughout. Sensation: Sensation is intact to light touch and pinprick throughout (facial, trunk, extremities). Vibration is intact at the bilateral big toe. There is no extinction with double simultaneous stimulation. There is no sensory dermatomal level identified. Coordination:  The patient has no dysdiadichokinesia or dysmetria. Motor: Strength is 5/5 in the bilateral upper and lower extremities.  Shoulder shrug is equal bilaterally.  There is no pronator drift.  There are no fasciculations noted. DTR's: Deep tendon reflexes are 2/4 at the bilateral biceps, triceps, brachioradialis, patella and achilles.  Plantar responses are downgoing bilaterally. Gait and Station: The patient is able to ambulate without difficulty. The patient is able to heel toe walk without any difficulty. The patient is able to ambulate in a tandem fashion. The patient is able to stand in the Romberg position.   MOVEMENT EXAM: Tremor:  There is *** tremor in the UE, noted most significantly with action.  The patient is *** able to draw Archimedes spirals without significant difficulty.  There is *** tremor at rest.  The patient is *** able to pour water from one glass to another without spilling it.     Assessment/Plan:   1.  Essential Tremor.  -This is evidenced by the symmetrical nature and longstanding hx of gradually getting worse.  We discussed nature and pathophysiology.  We discussed that this can continue to gradually get worse with time.  We discussed that some medications can worsen this, as can caffeine use.  We discussed medication therapy as well as surgical therapy.  I am not sure that the patient would be a good surgical candidate given her history of DVT/PE, TIA in 2018, advancing age.  CC:  Unk Pinto, MD

## 2019-02-08 ENCOUNTER — Ambulatory Visit: Payer: Medicare Other | Admitting: Neurology

## 2019-02-10 ENCOUNTER — Other Ambulatory Visit: Payer: Self-pay | Admitting: Adult Health

## 2019-02-10 DIAGNOSIS — G250 Essential tremor: Secondary | ICD-10-CM

## 2019-02-10 DIAGNOSIS — I10 Essential (primary) hypertension: Secondary | ICD-10-CM

## 2019-02-16 NOTE — Progress Notes (Signed)
Subjective:   Monica Curry was seen in consultation in the movement disorder clinic at the request of [name], MD.  The evaluation is for tremor and to eval for DBS candidacy.  Patient has been a longtime patient of [facility], first seeing Dr. Erling Cruz and most recently with Dr. Rexene Alberts.  She apparently was seen by Dr. Gilford Rile at [facility] in 2001, but those records are unavailable.  She has also seen Dr. Justine Null in [place].  She has had tremor since she was a child but was dx with ET in the late 1980's.  Early on, she tried primidone but did not tolerate it (unclear side effect in records but patient states today that she had first dose effect initially but then the dose was lowered and she did okay initially but then had some cognitive trouble and walking trouble but looking back some of that may be related to klonopin w/d as well).  She also has been on clonazepam for quite some time.  She is currently on 0.5 mg, 1 tablet in the morning, half a tablet at noon and half a tablet at bedtime.  Pt states that she uses it for tremor but "I do have anxiety and I have always had anxiety."  She has been on propranolol for many years (40 mg qid).  The dosage has been increased over that time.      Tremor is most noticeable when putting on makeup and eating or fixing meals.   There is no family hx of tremor.    Affected by caffeine:  Doesn't drink caffeine Affected by alcohol:  Yes.  , it helps Affected by stress:  Yes.   Affected by fatigue:  Yes.   Spills soup if on spoon:  Yes.   Spills glass of liquid if full:  Yes.   Affects ADL's (tying shoes, brushing teeth, etc):  No.  Current/Previously tried tremor medications: Primidone(gait and cognitive changes but was w/d from clonazepam at the same time), propranolol, clonazepam  Current medications that may exacerbate tremor:  n/a  Outside reports reviewed: historical medical records, lab reports, office notes and referral letter/letters.  I  personally reviewed her November, 2018 MRI of the brain.  There was moderate small vessel disease.  Allergies  Allergen Reactions  . Codeine Hives  . Meloxicam Nausea Only    Gastritis and nausea  . Mysoline [Primidone]   . Pneumovax 23 [Pneumococcal Vac Polyvalent]     Red, raised area.  Knot at site of injection  . Toradol [Ketorolac Tromethamine] Swelling    Localized redness at injection site.      Outpatient Encounter Medications as of 02/18/2019  Medication Sig  . aspirin 325 MG tablet Take 325 mg by mouth daily.  Marland Kitchen atorvastatin (LIPITOR) 10 MG tablet Take 1 tablet (10 mg total) by mouth daily.  . Calcium-Magnesium-Vitamin D 300-20-200 MG-MG-UNIT CHEW Chew 1 tablet daily by mouth.   . Cholecalciferol (VITAMIN D3) 5000 units TABS Take 1 tablet daily by mouth.  . clonazePAM (KLONOPIN) 0.5 MG tablet Take 1 pill in AM, 1/2 pill at noon and 1/2 pill at night.  . Magnesium 250 MG TABS Takes 1 tablet daily.  . propranolol (INDERAL) 40 MG tablet TAKE 1 TABLET BY MOUTH FOUR TIMES DAILY WITH MEALS AND BEDTIME FOR BLOOD PRESSURE AND TREMOR  . vitamin B-12 (CYANOCOBALAMIN) 500 MCG tablet Take 500 mcg by mouth daily.   No facility-administered encounter medications on file as of 02/18/2019.     Past Medical  History:  Diagnosis Date  . C. difficile colitis   . Cancer [facility])    Endometrial cancer  . Essential and other specified forms of tremor   . Osteoporosis 11/2017   T score -2.7 stable from prior DEXA 2016  . Personal history of venous thrombosis and embolism 1997  . Stroke ([place])   . Thrush   . TIA (transient ischemic attack)     Past Surgical History:  Procedure Laterality Date  . ABDOMINAL HYSTERECTOMY  1997  . APPENDECTOMY    . [place]  . OOPHORECTOMY     BSO  . TONSILLECTOMY AND ADENOIDECTOMY      Social History   Socioeconomic History  . Marital status: Widowed    Spouse name: Not on file  . Number of children: Not on file  . Years of education:  Not on file  . Highest education level: Not on file  Occupational History  . Not on file  Social Needs  . Financial resource strain: Not on file  . Food insecurity:    Worry: Not on file    Inability: Not on file  . Transportation needs:    Medical: Not on file    Non-medical: Not on file  Tobacco Use  . Smoking status: Never Smoker  . Smokeless tobacco: Never Used  Substance and Sexual Activity  . Alcohol use: Yes    Alcohol/week: 0.0 standard drinks    Comment: ocassionally  . Drug use: No  . Sexual activity: Never    Birth control/protection: Post-menopausal, Surgical    Comment: HYST-1st intercourse 85 yo-Fewer than 5 partners  Lifestyle  . Physical activity:    Days per week: Not on file    Minutes per session: Not on file  . Stress: Not on file  Relationships  . Social connections:    Talks on phone: Not on file    Gets together: Not on file    Attends religious service: Not on file    Active member of club or organization: Not on file    Attends meetings of clubs or organizations: Not on file    Relationship status: Not on file  . Intimate partner violence:    Fear of current or ex partner: Not on file    Emotionally abused: Not on file    Physically abused: Not on file    Forced sexual activity: Not on file  Other Topics Concern  . Not on file  Social History Narrative  . Not on file    Family Status  Relation Name Status  . Mother  Deceased at age 60  . Father  Deceased at age 77  . Brother  Deceased  . Brother  Deceased  . Sister  Alive  . Sister  Alive  . MGM  Deceased  . MGF  Deceased  . PGM  Deceased  . PGF  Deceased  . Brother  Alive    Review of Systems Review of Systems  Constitutional: Negative.   HENT: Negative.   Eyes: Negative.   Respiratory: Negative.   Cardiovascular: Negative.   Gastrointestinal: Negative.   Genitourinary: Negative.   Musculoskeletal: Negative.   Skin: Negative.   Endo/Heme/Allergies: Negative.        Objective:   VITALS:   Vitals:   02/18/19 1027  BP: 110/68  Pulse: 60  Temp: 97.9 F (36.6 C)  TempSrc: Oral  SpO2: 98%  Weight: 151 lb (68.5 kg)  Height: 5\' 4"  (1.626 m)  Gen:  Appears stated age and in NAD.  Pt is verbose and somewhat trouble staying on topic. HEENT:  Normocephalic, atraumatic. The mucous membranes are moist. The superficial temporal arteries are without ropiness or tenderness. Cardiovascular: Regular rate and rhythm. Lungs: Clear to auscultation bilaterally. Neck: There are no carotid bruits noted bilaterally.  NEUROLOGICAL:  Orientation:  The patient is alert and oriented x 3.  Recent and remote memory are intact.  Attention span and concentration are normal.  Able to name objects and repeat without trouble.  Fund of knowledge is appropriate Cranial nerves: There is good facial symmetry. The pupils are equal round and reactive to light bilaterally. Fundoscopic exam reveals clear disc margins bilaterally. Extraocular muscles are intact and visual fields are full to confrontational testing. Speech is fluent and clear. Soft palate rises symmetrically and there is no tongue deviation. Hearing is intact to conversational tone. Tone: Tone is good throughout. Sensation: Sensation is intact to light touch and pinprick throughout (facial, trunk, extremities). Vibration is intact at the bilateral big toe. There is no extinction with double simultaneous stimulation. There is no sensory dermatomal level identified. Coordination:  The patient has no dysdiadichokinesia or dysmetria. Motor: Strength is 5/5 in the bilateral upper and lower extremities.  Shoulder shrug is equal bilaterally.  There is no pronator drift.  There are no fasciculations noted. DTR's: Deep tendon reflexes are 2/4 at the bilateral biceps, triceps, brachioradialis, patella and achilles.  Plantar responses are downgoing bilaterally. Gait and Station: The patient is able to ambulate without difficulty. The  patient is able to heel toe walk without any difficulty. The patient is able to ambulate in a tandem fashion. The patient is able to stand in the Romberg position.   MOVEMENT EXAM: Tremor:  There is  tremor in the UE, noted most significantly with action.  The right is worse than the left.  It is much worse with intention.  It is somewhat better when holding a weight.  She has trouble with Archimedes spirals.  She has trouble with pouring water from one glass to another when the full glass is in the right hand.   There is jaw tremor     Assessment/Plan:   1.  Essential Tremor.  -This is evidenced by the symmetrical nature and longstanding hx of gradually getting worse.  We discussed nature and pathophysiology.  We discussed that this can continue to gradually get worse with time.  We discussed that some medications can worsen this, as can caffeine use.  She is on clonazepam and propranolol.  She and I discussed surgical therapies in detail. I talked to the patient about the logistics associated with DBS therapy.  I talked to the patient about risks/benefits/side effects of DBS therapy.  We talked about risks which included but were not limited to infection, paralysis, intraoperative seizure, death, stroke, bleeding around the electrode.   I talked to patient about fiducial placement 1 week prior to DBS therapy.  I talked to the patient about what to expect in the operating room, including the fact that this is an awake surgery.  We talked about battery placement as well as which is done under general anesthesia, generally approximately one week following the initial surgery.  We also talked about the fact that the patient will need to be off of medications for surgery.  The patient ultimately stated that she is not interested in DBS therapy and thinks that tremor is not that bad.  She does asked me about focused  ultrasound and we also discussed that in detail.  Again, she is not particularly interested.   She was given information on readi steadi glove.  We talked about weighted spoons and forks.  She will follow-up with Dr. Rexene Alberts.  Much greater than 50% of this visit was spent in counseling and coordinating care.  Total face to face time:  65 min  CC:  Unk Pinto, MD

## 2019-02-18 ENCOUNTER — Ambulatory Visit: Payer: Medicare Other | Admitting: Neurology

## 2019-02-18 ENCOUNTER — Other Ambulatory Visit: Payer: Self-pay

## 2019-02-18 ENCOUNTER — Encounter: Payer: Self-pay | Admitting: Neurology

## 2019-02-18 VITALS — BP 110/68 | HR 60 | Temp 97.9°F | Ht 64.0 in | Wt 151.0 lb

## 2019-02-18 DIAGNOSIS — G250 Essential tremor: Secondary | ICD-10-CM

## 2019-02-18 NOTE — Patient Instructions (Signed)
Good to see you today!  

## 2019-03-16 NOTE — Progress Notes (Signed)
MEDICARE WELLNESS Assessment and Plan:  Encounter for Medicare annual wellness exam 1 year   HT CKD 3 (GFR 89ml/min) -     COMPLETE METABOLIC PANEL WITH GFR Increase fluids, avoid NSAIDS, monitor sugars, will monitor  Essential hypertension - continue medications, DASH diet, exercise and monitor at home. Call if greater than 130/80.  -     CBC with Differential/Platelet -     COMPLETE METABOLIC PANEL WITH GFR -     TSH  Essential tremor Continue medications Discussed possible adding of topamax versus gabapentin but will follow up with neuro in august for other options Not interested in DBS  Vitamin D deficiency -     VITAMIN D 25 Hydroxy (Vit-D Deficiency, Fractures)  Hx/o DVT/PE Monitor, continue ASA  Mixed hyperlipidemia check lipids decrease fatty foods increase activity.  -     Lipid panel  Other abnormal glucose Discussed disease progression and risks Discussed diet/exercise, weight management and risk modification  Medication management -     Magnesium  History of TIA (transient ischemic attack) Continue neuro follow up and ASA  History of endometrial cancer Monitor  Urethral prolapse Monitor  Osteoporosis, unspecified osteoporosis type, unspecified pathological fracture presence Continue follow up GYN, due this year  Discussed med's effects and SE's. Screening labs and tests as requested with regular follow-up as recommended. Over 40 minutes of exam, counseling, chart review, and complex, high level critical decision making was performed this visit.   Future Appointments  Date Time Provider [place]  06/16/2019  3:00 PM [name], NP [facility] None  07/21/2019  9:30 AM [name], NP [facility] None  11/08/2019 10:30 AM Fontaine, Belinda Block, MD [facility]    Plan:   During the course of the visit the patient was educated and counseled about appropriate screening and preventive services including:    Pneumococcal vaccine    Prevnar 13  Influenza vaccine  Td vaccine  Screening electrocardiogram  Bone densitometry screening  Colorectal cancer screening  Diabetes screening  Glaucoma screening  Nutrition counseling   Advanced directives: requested    HPI  85 y.o. female  presents for a MEDICARE WELLNESS and follow up for has History of endometrial cancer; Hx/o DVT/PE; Osteoporosis; Urethral prolapse; Essential hypertension; Mixed hyperlipidemia; Other abnormal glucose; Vitamin D deficiency; Essential tremor; Medication management;  HT CKD 3 (GFR 61ml/min); History of TIA (transient ischemic attack); and Leg swelling on their problem list.    The patient was admitted in 10/2017 for a L Brain TIA w/ transient expressive aphasia. MRI, MRA, 2D echo, Carotid Dopplers, were Normal. Patient was released on LD bASA, she is now on ASA. She is followed by Neurology. She takes klonopin and propanolol for tremor.She had a consultation for DBS but her tremor is not debilitating enough.  She has never been on topamax/gabapentin added on, has appointment in August.   She is also followed by Dr. Soundra Pilon (GYN) annually who manages PAPs, MMG, DEXA. She is currently on drug break from bisphosphonate for osteoporosis. Due for DEXA at the end of this year.   BMI is Body mass index is 25.75 kg/m., she has been working on diet and exercise. Wt Readings from Last 3 Encounters:  03/17/19 150 lb (68 kg)  02/18/19 151 lb (68.5 kg)  01/18/19 150 lb (68 kg)   Her blood pressure has been controlled at home, today [name] is BP: 120/74 She does workout. She denies chest pain, shortness of breath, dizziness.   She is on cholesterol  medication (atorvastatin 20 mg daily) and denies myalgias. Her cholesterol is not at goal. The cholesterol last visit was:   Lab Results  Component Value Date   CHOL 119 12/03/2018   HDL 43 (L) 12/03/2018   LDLCALC 50 12/03/2018   TRIG 179 (H) 12/03/2018   CHOLHDL 2.8 12/03/2018    She has  been working on diet and exercise for glucose management, she is on bASA, and denies foot ulcerations, increased appetite, nausea, paresthesia of the feet, polydipsia, polyuria, visual disturbances, vomiting and weight loss. Last A1C in the office was:  Lab Results  Component Value Date   HGBA1C 5.5 12/03/2018   Last GFR: Lab Results  Component Value Date   GFRNONAA 47 (L) 12/03/2018   Patient is on Vitamin D supplement   Lab Results  Component Value Date   VD25OH 48 12/03/2018      Current Medications:  Current Outpatient Medications on File Prior to Visit  Medication Sig Dispense Refill  . aspirin 325 MG tablet Take 325 mg by mouth daily.    Marland Kitchen atorvastatin (LIPITOR) 10 MG tablet Take 1 tablet (10 mg total) by mouth daily. 90 tablet 1  . Calcium-Magnesium-Vitamin D 300-20-200 MG-MG-UNIT CHEW Chew 1 tablet daily by mouth.     . Cholecalciferol (VITAMIN D3) 5000 units TABS Take 1 tablet daily by mouth.    . clonazePAM (KLONOPIN) 0.5 MG tablet Take 1 pill in AM, 1/2 pill at noon and 1/2 pill at night. 60 tablet 5  . Magnesium 250 MG TABS Takes 1 tablet daily. 50 tablet 0  . propranolol (INDERAL) 40 MG tablet TAKE 1 TABLET BY MOUTH FOUR TIMES DAILY WITH MEALS AND BEDTIME FOR BLOOD PRESSURE AND TREMOR 360 tablet 3  . vitamin B-12 (CYANOCOBALAMIN) 500 MCG tablet Take 500 mcg by mouth daily.     No current facility-administered medications on file prior to visit.    Allergies:  Allergies  Allergen Reactions  . Codeine Hives  . Meloxicam Nausea Only    Gastritis and nausea  . Mysoline [Primidone]   . Pneumovax 23 [Pneumococcal Vac Polyvalent]     Red, raised area.  Knot at site of injection  . Toradol [Ketorolac Tromethamine] Swelling    Localized redness at injection site.     Medical History:  She has History of endometrial cancer; Hx/o DVT/PE; Osteoporosis; Urethral prolapse; Essential hypertension; Mixed hyperlipidemia; Other abnormal glucose; Vitamin D deficiency; Essential  tremor; Medication management;  HT CKD 3 (GFR 78ml/min); History of TIA (transient ischemic attack); and Leg swelling on their problem list.   Health Maintenance:   Immunization History  Administered Date(s) Administered  . H1N1 01/03/2009  . Influenza Whole 09/23/2013  . Influenza, High Dose Seasonal PF 09/06/2014, 08/23/2015, 08/21/2016, 08/28/2017, 09/01/2018  . PPD Test 03/29/2014  . Pneumococcal Polysaccharide-23 03/29/2014  . Pneumococcal-Unspecified 12/09/2002  . Td 03/23/2013  . Zoster 12/09/2006   Preventative care: Last colonoscopy: 2011 MGM 11/2017 DEXA 2016, 2018 - by Dr. Phineas Real, Osteoporosis, on break from bisphosphonates PAP 2018 negative  Influenza 2018 TD 2014 Pneumonia 2015 prevnar 13 has allergy - D/C'd Zoster 2008  Names of Other Physician/Practitioners you currently use: 1. [facility] here for primary care 2. Dr Rutherford Guys, eye doctor, last visit 2019, has cataracts, monitoring 3. Dr Ennis Forts, [place], dentist, last visit 2019 & every 4 months  Patient Care Team: Unk Pinto, MD as PCP - General (Internal Medicine) Fay Records, MD as PCP - Cardiology (Cardiology) Ronald Lobo,  MD as Consulting Physician (Gastroenterology) Rutherford Guys, MD as Consulting Physician (Ophthalmology) Clance, Armando Reichert, MD as Consulting Physician (Pulmonary Disease) Fontaine, Belinda Block, MD as Consulting Physician (Gynecology) Love, Alyson Locket, MD as Consulting Physician (Neurology) [name], MD as Attending Physician (Neurology) Harriett Sine, MD as Consulting Physician (Dermatology)  Surgical History:  She has a past surgical history that includes Cesarean section (1961); Abdominal hysterectomy (1997); Oophorectomy; Tonsillectomy and adenoidectomy; and Appendectomy. Family History:  Herfamily history includes Cancer in her brother, brother, father, and mother; Dementia in her mother; Diabetes in her sister; Heart disease in  her brother, brother, brother, sister, and sister; Hypertension in her sister; Kidney failure in her brother and sister. Social History:  She reports that she has never smoked. She has never used smokeless tobacco. She reports current alcohol use. She reports that she does not use drugs.  MEDICARE WELLNESS OBJECTIVES: Physical activity: Current Exercise Habits: The patient does not participate in regular exercise at present(does yard work and house work) Cardiac risk factors: Cardiac Risk Factors include: dyslipidemia;advanced age (>47men, >30 women);hypertension;obesity (BMI >30kg/m2) Depression/mood screen:   Depression screen [facility] 2/9 03/17/2019  Decreased Interest 0  Down, Depressed, Hopeless 0  PHQ - 2 Score 0    ADLs:  In your present state of health, do you have any difficulty performing the following activities: 03/17/2019 12/06/2018  Hearing? N N  Vision? N N  Difficulty concentrating or making decisions? N N  Walking or climbing stairs? N N  Dressing or bathing? N N  Doing errands, shopping? N N  Some recent data might be hidden     Cognitive Testing  Alert? Yes  Normal Appearance?Yes  Oriented to person? Yes  Place? Yes   Time? Yes  Recall of three objects?  Yes  Can perform simple calculations? Yes  Displays appropriate judgment?Yes  Can read the correct time from a watch face?Yes  EOL planning: Does Patient Have a Medical Advance Directive?: Yes Type of Advance Directive: Healthcare Power of Attorney, Living will [place] in Chart?: No - copy requested    Review of Systems: Review of Systems  Constitutional: Negative for malaise/fatigue and weight loss.  HENT: Negative for hearing loss and tinnitus.   Eyes: Negative for blurred vision and double vision.  Respiratory: Negative for cough, sputum production, shortness of breath and wheezing.   Cardiovascular: Positive for palpitations (Rare heart racing, has been worked up by cardiology).  Negative for chest pain, orthopnea, claudication, leg swelling and PND.  Gastrointestinal: Negative for abdominal pain, blood in stool, constipation, diarrhea, heartburn, melena, nausea and vomiting.  Genitourinary: Negative.   Musculoskeletal: Negative for falls, joint pain and myalgias.  Skin: Negative for rash.  Neurological: Positive for tremors (Baseline, bilateral hands). Negative for dizziness, tingling, sensory change, weakness and headaches.  Endo/Heme/Allergies: Negative for polydipsia.  Psychiatric/Behavioral: Negative.  Negative for depression, memory loss, substance abuse and suicidal ideas. The patient is not nervous/anxious and does not have insomnia.   All other systems reviewed and are negative.   Physical Exam: Estimated body mass index is 25.75 kg/m as calculated from the following:   Height as of this encounter: 5\' 4"  (1.626 m).   Weight as of this encounter: 150 lb (68 kg). BP 120/74   Pulse 61   Temp (!) 97.5 F (36.4 C)   Ht 5\' 4"  (1.626 m)   Wt 150 lb (68 kg)   SpO2 98%   BMI 25.75 kg/m  General Appearance: Well nourished,  in no apparent distress.  Eyes: PERRLA, EOMs, conjunctiva no swelling or erythema, normal fundi and vessels.  Sinuses: No Frontal/maxillary tenderness  ENT/Mouth: Ext aud canals clear, normal light reflex with TMs without erythema, bulging. Good dentition. No erythema, swelling, or exudate on post pharynx. Tonsils not swollen or erythematous. Hearing normal.  Neck: Supple, thyroid normal. No bruits  Respiratory: Respiratory effort normal, BS equal bilaterally without rales, rhonchi, wheezing or stridor.  Cardio: RRR without murmurs, rubs or gallops. Brisk peripheral pulses without edema.  Chest: symmetric, with normal excursions and percussion.  Abdomen: Soft, nontender, no guarding, rebound, hernias, masses, or organomegaly.  Lymphatics: Non tender without lymphadenopathy.  Musculoskeletal: Full ROM all peripheral extremities,5/5  strength, and normal gait.  Skin: Warm, dry without rashes, lesions, ecchymosis. Neuro: Cranial nerves intact, reflexes equal bilaterally. Normal muscle tone, no cerebellar symptoms. Sensation intact.  Psych: Awake and oriented X 3, normal affect, Insight and Judgment appropriate.    Medicare Attestation I have personally reviewed: The patient's medical and social history Their use of alcohol, tobacco or illicit drugs Their current medications and supplements The patient's functional ability including ADLs,fall risks, home safety risks, cognitive, and hearing and visual impairment Diet and physical activities Evidence for depression or mood disorders  The patient's weight, height, BMI, and visual acuity have been recorded in the chart.  I have made referrals, counseling, and provided education to the patient based on review of the above and I have provided the patient with a written personalized care plan for preventive services.    Vicie Mutters 1:30 PM [facility]

## 2019-03-17 ENCOUNTER — Other Ambulatory Visit: Payer: Self-pay

## 2019-03-17 ENCOUNTER — Ambulatory Visit: Payer: Medicare Other | Admitting: Physician Assistant

## 2019-03-17 ENCOUNTER — Encounter: Payer: Self-pay | Admitting: Physician Assistant

## 2019-03-17 VITALS — BP 120/74 | HR 61 | Temp 97.5°F | Ht 64.0 in | Wt 150.0 lb

## 2019-03-17 DIAGNOSIS — N183 Chronic kidney disease, stage 3 unspecified: Secondary | ICD-10-CM

## 2019-03-17 DIAGNOSIS — G250 Essential tremor: Secondary | ICD-10-CM

## 2019-03-17 DIAGNOSIS — Z0001 Encounter for general adult medical examination with abnormal findings: Secondary | ICD-10-CM | POA: Diagnosis not present

## 2019-03-17 DIAGNOSIS — R7309 Other abnormal glucose: Secondary | ICD-10-CM

## 2019-03-17 DIAGNOSIS — Z0000 Encounter for general adult medical examination without abnormal findings: Secondary | ICD-10-CM

## 2019-03-17 DIAGNOSIS — E559 Vitamin D deficiency, unspecified: Secondary | ICD-10-CM | POA: Diagnosis not present

## 2019-03-17 DIAGNOSIS — Z8673 Personal history of transient ischemic attack (TIA), and cerebral infarction without residual deficits: Secondary | ICD-10-CM

## 2019-03-17 DIAGNOSIS — Z8542 Personal history of malignant neoplasm of other parts of uterus: Secondary | ICD-10-CM

## 2019-03-17 DIAGNOSIS — I10 Essential (primary) hypertension: Secondary | ICD-10-CM | POA: Diagnosis not present

## 2019-03-17 DIAGNOSIS — R6889 Other general symptoms and signs: Secondary | ICD-10-CM

## 2019-03-17 DIAGNOSIS — Z86718 Personal history of other venous thrombosis and embolism: Secondary | ICD-10-CM

## 2019-03-17 DIAGNOSIS — N368 Other specified disorders of urethra: Secondary | ICD-10-CM

## 2019-03-17 DIAGNOSIS — Z79899 Other long term (current) drug therapy: Secondary | ICD-10-CM

## 2019-03-17 DIAGNOSIS — E782 Mixed hyperlipidemia: Secondary | ICD-10-CM

## 2019-03-17 DIAGNOSIS — M810 Age-related osteoporosis without current pathological fracture: Secondary | ICD-10-CM

## 2019-03-17 DIAGNOSIS — M7989 Other specified soft tissue disorders: Secondary | ICD-10-CM

## 2019-03-17 NOTE — Patient Instructions (Addendum)
Follow up with Neuro in August but the medications we were talking about adding were topamax or gabapentin  ALLERGY MEDICATIONS OVER THE COUNTER  Please pick one of the over the counter allergy medications below and take it once daily for allergies.  Claritin or loratadine cheapest but likely the weakest  Zyrtec or certizine at night because it can make you sleepy The strongest is allegra or fexafinadine  Cheapest at [employer], [employer], [employer]  Can do a steroid nasal spary 1-2 sparys at night each nostril.  Remember to spray each nostril twice towards the outer part of your eye.   Do not sniff but instead pinch your nose and tilt your head back to help the medicine get into your sinuses.   The best time to do this is at bedtime.  Stop if you get blurred vision or nose bleeds.   THIS WILL TAKE 7 DAYS TO WORK AND IS BETTER IF YOU START BEFORE SYMPTOMS SO IF YOU HAVE A SEASON OR TIME OF THE YEAR YOU ALWAYS GET A COLD, START BEFORE THAT!  AN UPDATE FROM [facility] Remember information is changing on an hourly basis, we are doing our best to stay up to date on the information.  If you have a question or concern please contact our office.   Daily Quarantine Questions to lift the spirit and help the body... - Who am I checking on or connecting with today? - What expectations of "normal" am I letting go of today? - How am I getting outside today? - How am I expressing my creativity today? - How am I moving my body today? - What type of self-care am I practicing today? - What am I grateful for today?  What are the symptoms? There are many different symptoms reported such as diarrhea, loss of smell, muscle aches HOWEVER the most common associated with the virus at this time is FEVER, DRY COUGH, and SHORTNESS OF BREATH.   What is shortness of breath that is concerning? - If you are unable to talk in full sentences - if you are unable to take a full breath  (not to deep) and hold for 20-25 seconds.  - If you are panting while walking.  Some shortness of breath is common with this virus. Please do not panic. The best thing to do with any symptoms is to call the office. We can see you on video or talk with you on the phone to best determine if you need to go to the ER or get tested.   What to do if you have symptoms or you feel that you have come in contact with someone that may be infected with coronavirus? Please stay at home.  Call our office or send a MyChart message.  Please remember the majority of cases are self-limited and do not require in hospital intervention.  There is no specific treatment for a mild case of the virus other than supportive care.  Please only go to the ER if your symptoms escalate such as worsening shortness of breath, chest pain, and confusion.   As we mentioned before we are continually getting new information and there is data showing that even if you do not have symptoms you may be able to spread the virus. Therefore, the best thing to do is to stay home! Please limit your contact with people, be mindful of social distancing.   At this time we are STILL NOT accepting walk in appointments or sick  visits.  We are still having asymptomatic patients come into the office however we are asking that you call the office from your car to notify that you are here and we can check you into your appointment via the telephone.  We will have a nurse call you when she is ready for you to come into the office, this way we will not have patients waiting in the waiting room and can be more efficient.   Virtual visits and Evisits are in full swing and so far the patients are really enjoying them For your convenience and to prevent transmission, we are now performing Evisits or telephone encounters for virtual visits for sick visits and regular scheduled follow up visits.  You can contact one of your providers through [place], your patient  portal at any time or call the office during business hours.   Medicare and insurances are covering this during this crisis.  This helps you because we are able to talk about your conditions, discuss things to monitor and do; in addition, it also helps [place] as a practice. We are a small private practice and need our patients help to remain open by doing these visits.  So please consider doing a virtual visit rather than rescheduling completely!  PLEASE DO NOT CLICK THE EVISIT BUTTON ON MY CHART, INSTEAD CHOOSE TO MESSAGE YOUR PROVIDER AND SEND [place] A MESSAGE WITH YOUR ISSUES.   As a community we are no longer testing patients with mild symptoms.  If the symptoms are mild, we are NOT testing patients to conserve supplies and capacity so our health care workers can care for people who need medical attention even during the peak of the outbreak.  Patients with mild symptoms are being instructed to stay at home and recover for 2 weeks.  You can stay in contact with [place] during that time by mychart, telephone calls or we are starting virtual office visits with video capability.   Mild symptoms include cough,fever WITHOUT any of the following symptoms: Difficulty breathing, chest discomfort, altered thinking and confusion.  Please notify us immediately if you have these symptoms.   We have also decided NOT to do testing in our office for coronavirus at this time though we are continually accessing the situation as needed and will notify you if this changes.    If after a mychart communication or telephone visit, we feel you need testing then we will put in an order for you to get tested at a specimen collection site through [facility].   We will not test you if you do not have symptoms or have not had potential contact at this time.  We will not test you if you just show up to the office, you need an appointment specifically for testing, please call or message first.   Please remember that this  virus is happening at the same time as other respiratory viruses, such as influenza, common colds and now allergy season.   To reduce your risk of infection we recommend the same precautions as those used to avoid the common cold and flu virus  . Please wash your hands with soap and water for 20 seconds and frequently. Wendee Copp your hands before you eat or touch your face.  . Avoid touching your face, eyes, nose, and mouth as much as possible.  . if needing to cough or sneeze cover your mouth and nose by coughing or sneezing into your elbow area, your sleeve or a tissue .  Routinely clean frequently touched items such as doorknobs, keyboards, and phones.  . Avoid crowds of people.  Marland Kitchen avoid shaking hands with others; . We recommend anyone within the high-risk demographics such as older adults and those with heart/lung disease, auto-immune or immune-suppressing health conditions to consider reduced travel and public outings.  Please refer to the sources below for current updates, guidelines and recommendations.  You can call this hotline set up by [facility] hospital 1 [phone] [phone])  You can visit these websites: CDC.gov WHO.int

## 2019-03-18 LAB — CBC WITH DIFFERENTIAL/PLATELET
Absolute Monocytes: 441 cells/uL (ref 200–950)
Basophils Absolute: 50 cells/uL (ref 0–200)
Basophils Relative: 1.1 %
Eosinophils Absolute: 149 cells/uL (ref 15–500)
Eosinophils Relative: 3.3 %
HCT: 41.3 % (ref 35.0–45.0)
Hemoglobin: 13.8 g/dL (ref 11.7–15.5)
Lymphs Abs: 2138 cells/uL (ref 850–3900)
MCH: 28.9 pg (ref 27.0–33.0)
MCHC: 33.4 g/dL (ref 32.0–36.0)
MCV: 86.6 fL (ref 80.0–100.0)
MPV: 10.4 fL (ref 7.5–12.5)
Monocytes Relative: 9.8 %
Neutro Abs: 1724 cells/uL (ref 1500–7800)
Neutrophils Relative %: 38.3 %
Platelets: 259 10*3/uL (ref 140–400)
RBC: 4.77 10*6/uL (ref 3.80–5.10)
RDW: 13.6 % (ref 11.0–15.0)
Total Lymphocyte: 47.5 %
WBC: 4.5 10*3/uL (ref 3.8–10.8)

## 2019-03-18 LAB — LIPID PANEL
Cholesterol: 107 mg/dL (ref <200)
HDL: 42 mg/dL — ABNORMAL LOW (ref >=50)
LDL Cholesterol (Calc): 47 mg/dL (calc)
Non-HDL Cholesterol (Calc): 65 mg/dL (calc) (ref <130)
Total CHOL/HDL Ratio: 2.5 (calc) (ref <5.0)
Triglycerides: 92 mg/dL (ref <150)

## 2019-03-18 LAB — COMPLETE METABOLIC PANEL WITH GFR
AG Ratio: 1.7 (calc) (ref 1.0–2.5)
ALT: 9 U/L (ref 6–29)
AST: 13 U/L (ref 10–35)
Albumin: 4 g/dL (ref 3.6–5.1)
Alkaline phosphatase (APISO): 36 U/L — ABNORMAL LOW (ref 37–153)
BUN/Creatinine Ratio: 13 (calc) (ref 6–22)
BUN: 17 mg/dL (ref 7–25)
CO2: 28 mmol/L (ref 20–32)
Calcium: 9.3 mg/dL (ref 8.6–10.4)
Chloride: 105 mmol/L (ref 98–110)
Creat: 1.27 mg/dL — ABNORMAL HIGH (ref 0.60–0.88)
GFR, Est African American: 45 mL/min/{1.73_m2} — ABNORMAL LOW (ref >=60)
GFR, Est Non African American: 38 mL/min/{1.73_m2} — ABNORMAL LOW (ref >=60)
Globulin: 2.3 g/dL (calc) (ref 1.9–3.7)
Glucose, Bld: 91 mg/dL (ref 65–99)
Potassium: 4.9 mmol/L (ref 3.5–5.3)
Sodium: 137 mmol/L (ref 135–146)
Total Bilirubin: 0.7 mg/dL (ref 0.2–1.2)
Total Protein: 6.3 g/dL (ref 6.1–8.1)

## 2019-03-18 LAB — TSH: TSH: 1.55 mIU/L (ref 0.40–4.50)

## 2019-03-18 LAB — MAGNESIUM: Magnesium: 1.9 mg/dL (ref 1.5–2.5)

## 2019-03-18 LAB — VITAMIN D 25 HYDROXY (VIT D DEFICIENCY, FRACTURES): Vit D, 25-Hydroxy: 71 ng/mL (ref 30–100)

## 2019-05-20 ENCOUNTER — Encounter: Payer: Self-pay | Admitting: Adult Health

## 2019-06-02 ENCOUNTER — Other Ambulatory Visit: Payer: Self-pay | Admitting: *Deleted

## 2019-06-02 MED ORDER — ATORVASTATIN CALCIUM 10 MG PO TABS: 10.0000 mg | ORAL_TABLET | Freq: Every day | ORAL | 1 refills | Status: DC

## 2019-06-15 DIAGNOSIS — E538 Deficiency of other specified B group vitamins: Secondary | ICD-10-CM | POA: Insufficient documentation

## 2019-06-15 NOTE — Progress Notes (Signed)
Complete Physical  Assessment and Plan:  Routine general exam with abnormal findings  Essential hypertension At goal; continue medication Monitor blood pressure at home; call if consistently over 130/80 Continue DASH diet.   Reminder to go to the ER if any CP, SOB, nausea, dizziness, severe HA, changes vision/speech, left arm numbness and tingling and jaw pain.  Complete RBBB Followed by cardiology; monitor   History of TIA (transient ischemic attack) Continue ASA; follow up with neurology Control blood pressure, cholesterol, glucose, increase exercise.   Essential tremor  Continue follow up with neuro, stable with inderal, clonazepam PRN  Osteoporosis, unspecified osteoporosis type, unspecified pathological fracture presence Curently on drug break with hx of bisphosphonate tx; recent DEXA shows stable T score; continue follow up with Dr. Soundra Pilon Continue vitamin D supplementation, recommend weight bearing exercises  History of endometrial cancer S/p hysterectomy; continue follow up with Dr. Soundra Pilon  Urethral prolapse S/p hysterectomy; continue follow up with Dr. Soundra Pilon   HT CKD 3 [facility]) Increase fluids, avoid NSAIDS, monitor sugars, will monitor  Vitamin D deficiency At goal; Continue supplementation Check vitamin D level  Hx/o DVT/PE Continue low dose ASA   Mixed hyperlipidemia Well controlled; continue statin Continue low cholesterol diet and exercise.  Check lipid panel  Other abnormal glucose Discussed disease and risks Discussed diet/exercise, weight management  Check A1C  B12 deficiency Newly on supplement (sublingual); check levels today then as needed   Personal history of skin cancer (basal cell) Followed by derm  Orders Placed This Encounter  Procedures  . CBC with Differential/Platelet  . COMPLETE METABOLIC PANEL WITH GFR  . Magnesium  . Lipid panel  . TSH  . Hemoglobin A1c  . VITAMIN D 25 Hydroxy (Vit-D Deficiency, Fractures)  . Vitamin  B12  . Microalbumin / creatinine urine ratio  . Urinalysis, Routine w reflex microscopic  . EKG 12-Lead   Discussed med's effects and SE's. Screening labs and tests as requested with regular follow-up as recommended. Over 40 minutes of exam, counseling, chart review, and complex, high level critical decision making was performed this visit.   Future Appointments  Date Time Provider [place]  07/21/2019  9:30 AM [name], NP [facility] None  11/08/2019 10:30 AM Fontaine, Belinda Block, MD [facility] [name]  03/16/2020 11:15 AM Vicie Mutters, PA-C [facility] None  06/22/2020  3:00 PM [name], NP [facility] None     HPI  85 y.o. female  presents for a complete physical and follow up for has History of endometrial cancer; Hx/o DVT/PE; Osteoporosis; Urethral prolapse; Essential hypertension; Mixed hyperlipidemia; Other abnormal glucose; Vitamin D deficiency; Essential tremor; Medication management;  HT CKD 3 (GFR 19ml/min); History of TIA (transient ischemic attack); Leg swelling; B12 deficiency; and History of skin cancer (basal cell) on their problem list.   She has been aggressively socially isolating due to covid 19.   She is widowed, 1 living child. She has 2 grandchildren, 1 is in [place] getting her masters. 64 year old sister is now blind due to temporal cell arteritis this year.   The patient was admitted in 10/2017 for a L Brain TIA w/ transient expressive aphasia. MRI, MRA, 2D echo, Carotid Dopplers, were Normal. Patient was released on LD bASA and continues on statin. She is followed by Neurology. Takes propranolol 40 mg QID daily and Clonazepam 0.5 mg PRN for tremors.  She is also followed by Dr. Soundra Pilon (GYN) annually who manages PAPs, MMG, DEXA. She is currently on drug break from bisphosphonate for osteoporosis.  BMI is Body mass index is 25.99 kg/m., she has been working on diet and exercise.  Wt Readings from Last 3 Encounters:  06/16/19 151 lb 6.4 oz (68.7 kg)   03/17/19 150 lb (68 kg)  02/18/19 151 lb (68.5 kg)   Her blood pressure has been controlled at home, today [name] is BP: 136/78 She does workout. She denies chest pain, shortness of breath, dizziness. Follows with Dr. Harrington Challenger after evaluation for episodes of syncope last year; holter showed short burst of SVT, now on propranolol, 2D echo showed LVEF 55-60% with grade 1 DD. No further episodes.   She is on cholesterol medication (atorvastatin 10 mg daily) and denies myalgias. Her cholesterol is at goal. The cholesterol last visit was:   Lab Results  Component Value Date   CHOL 107 03/17/2019   HDL 42 (L) 03/17/2019   LDLCALC 47 03/17/2019   TRIG 92 03/17/2019   CHOLHDL 2.5 03/17/2019   She has been working on diet and exercise for glucose management, she is on bASA, and denies foot ulcerations, increased appetite, nausea, paresthesia of the feet, polydipsia, polyuria, visual disturbances, vomiting and weight loss. Last A1C in the office was:  Lab Results  Component Value Date   HGBA1C 5.5 12/03/2018   Last GFR: Lab Results  Component Value Date   GFRNONAA 38 (L) 03/17/2019   Patient is on Vitamin D supplement   Lab Results  Component Value Date   VD25OH 71 03/17/2019     She is on supplement, taking 1000 mcg tab sublingual daily.  Lab Results  Component Value Date   GNFAOZHY86 578 05/18/2018    Current Medications:  Current Outpatient Medications on File Prior to Visit  Medication Sig Dispense Refill  . aspirin 325 MG tablet Take 325 mg by mouth daily.    Marland Kitchen atorvastatin (LIPITOR) 10 MG tablet Take 1 tablet (10 mg total) by mouth daily. 90 tablet 1  . Calcium-Magnesium-Vitamin D 300-20-200 MG-MG-UNIT CHEW Chew 1 tablet daily by mouth.     . Cholecalciferol (VITAMIN D3) 5000 units TABS Take 2 tablets by mouth daily.     . clonazePAM (KLONOPIN) 0.5 MG tablet Take 1 pill in AM, 1/2 pill at noon and 1/2 pill at night. 60 tablet 5  . Magnesium 250 MG TABS Takes 1 tablet daily.  50 tablet 0  . propranolol (INDERAL) 40 MG tablet TAKE 1 TABLET BY MOUTH FOUR TIMES DAILY WITH MEALS AND BEDTIME FOR BLOOD PRESSURE AND TREMOR 360 tablet 3  . vitamin B-12 (CYANOCOBALAMIN) 500 MCG tablet Take 500 mcg by mouth daily. Sublingual     No current facility-administered medications on file prior to visit.    Allergies:  Allergies  Allergen Reactions  . Codeine Hives  . Meloxicam Nausea Only    Gastritis and nausea  . Mysoline [Primidone]   . Pneumovax 23 [Pneumococcal Vac Polyvalent]     Red, raised area.  Knot at site of injection  . Toradol [Ketorolac Tromethamine] Swelling    Localized redness at injection site.     Medical History:  She has History of endometrial cancer; Hx/o DVT/PE; Osteoporosis; Urethral prolapse; Essential hypertension; Mixed hyperlipidemia; Other abnormal glucose; Vitamin D deficiency; Essential tremor; Medication management;  HT CKD 3 (GFR 49ml/min); History of TIA (transient ischemic attack); Leg swelling; B12 deficiency; and History of skin cancer (basal cell) on their problem list. Health Maintenance:   Immunization History  Administered Date(s) Administered  . H1N1 01/03/2009  . Influenza Whole 09/23/2013  .  Influenza, High Dose Seasonal PF 09/06/2014, 08/23/2015, 08/21/2016, 08/28/2017, 09/01/2018  . PPD Test 03/29/2014  . Pneumococcal Polysaccharide-23 03/29/2014  . Pneumococcal-Unspecified 12/09/2002  . Td 03/23/2013  . Zoster 12/09/2006    Preventative care: Last colonoscopy: 2011 DONE MGM 11/2018 ([facility])  DEXA 2016, 2018 - by Dr. Phineas Real, Osteoporosis, on break from bisphosphonates PAP 10/2018 negative, Dr. Phineas Real  Influenza 2019 TD 2014 Pneumonia 2015 prevnar 13 has allergy - D/C'd Zoster 2008  Names of Other Physician/Practitioners you currently use: 1. [facility] here for primary care 2. Dr Rutherford Guys, eye doctor, last visit 2019, has cataracts, monitoring 3. Dr Ennis Forts,  [place], dentist, last visit 2020 & every 4 months 4. Dr. Elvera Lennox, derm, last visit 2019, goes annually, hx of basal cell   Patient Care Team: Unk Pinto, MD as PCP - General (Internal Medicine) Fay Records, MD as PCP - Cardiology (Cardiology) Ronald Lobo, MD as Consulting Physician (Gastroenterology) Rutherford Guys, MD as Consulting Physician (Ophthalmology) Clance, Armando Reichert, MD as Consulting Physician (Pulmonary Disease) Fontaine, Belinda Block, MD as Consulting Physician (Gynecology) Love, Alyson Locket, MD as Consulting Physician (Neurology) [name], MD as Attending Physician (Neurology) Harriett Sine, MD as Consulting Physician (Dermatology)  Surgical History:  She has a past surgical history that includes Cesarean section (1961); Tonsillectomy and adenoidectomy; Appendectomy; and Total abdominal hysterectomy w/ bilateral salpingoophorectomy (1997). Family History:  Herfamily history includes Blindness in her sister; Cancer in her father and mother; Dementia in her mother; Diabetes in her sister; Heart disease in her brother, brother, brother, sister, and sister; Hypertension in her sister; Kidney failure in her brother and sister; Prostate cancer in her brother and brother; Skin cancer in her sister. Social History:  She reports that she has never smoked. She has never used smokeless tobacco. She reports current alcohol use. She reports that she does not use drugs.  Review of Systems: Review of Systems  Constitutional: Negative for malaise/fatigue and weight loss.  HENT: Negative for hearing loss and tinnitus.   Eyes: Negative for blurred vision and double vision.  Respiratory: Negative for cough, sputum production, shortness of breath and wheezing.   Cardiovascular: Positive for palpitations (Rare heart racing, has been worked up by cardiology). Negative for chest pain, orthopnea, claudication, leg swelling and PND.  Gastrointestinal: Negative for abdominal pain, blood in  stool, constipation, diarrhea, heartburn, melena, nausea and vomiting.  Genitourinary: Negative.   Musculoskeletal: Negative for falls, joint pain and myalgias.  Skin: Negative for rash.  Neurological: Positive for tremors (Baseline, bilateral hands). Negative for dizziness, tingling, sensory change, weakness and headaches.  Endo/Heme/Allergies: Negative for polydipsia.  Psychiatric/Behavioral: Negative.  Negative for depression, memory loss, substance abuse and suicidal ideas. The patient is not nervous/anxious and does not have insomnia.   All other systems reviewed and are negative.   Physical Exam: Estimated body mass index is 25.99 kg/m as calculated from the following:   Height as of this encounter: 5\' 4"  (1.626 m).   Weight as of this encounter: 151 lb 6.4 oz (68.7 kg). BP 136/78   Pulse 68   Temp (!) 97.4 F (36.3 C)   Resp 16   Ht 5\' 4"  (1.626 m)   Wt 151 lb 6.4 oz (68.7 kg)   BMI 25.99 kg/m  General Appearance: Well nourished, in no apparent distress.  Eyes: PERRLA, EOMs, conjunctiva no swelling or erythema, fundal exam deferred to ophth Sinuses: No Frontal/maxillary tenderness  ENT/Mouth: Ext aud canals clear, normal light reflex with  TMs without erythema, bulging. Good dentition. No erythema, swelling, or exudate on post pharynx. Tonsils not swollen or erythematous. Hearing normal.  Neck: Supple, thyroid normal. No bruits  Respiratory: Respiratory effort normal, BS equal bilaterally without rales, rhonchi, wheezing or stridor.  Cardio: RRR without murmurs, rubs or gallops. Brisk peripheral pulses without edema.  Chest: symmetric, with normal excursions and percussion.  Breasts: Defer to GYN  Abdomen: Soft, nontender, no guarding, rebound, hernias, masses, or organomegaly.  Lymphatics: Non tender without lymphadenopathy.  Genitourinary: Defer to GYN Musculoskeletal: Full ROM all peripheral extremities,5/5 strength, and normal gait.  Skin: Warm, dry without rashes,  lesions, ecchymosis. Neuro: Cranial nerves intact, reflexes equal bilaterally. Normal muscle tone, no cerebellar symptoms, subtle tremor of bilateral hands, Sensation intact.  Psych: Awake and oriented X 3, normal affect, Insight and Judgment appropriate.   EKG: Loletha Grayer, RBBB (consistent from previous) no ST changes.  Gorden Harms Ryanne Morand 4:13 PM [facility]

## 2019-06-16 ENCOUNTER — Encounter: Payer: Self-pay | Admitting: Adult Health

## 2019-06-16 ENCOUNTER — Ambulatory Visit (INDEPENDENT_AMBULATORY_CARE_PROVIDER_SITE_OTHER): Payer: Medicare Other | Admitting: Adult Health

## 2019-06-16 ENCOUNTER — Other Ambulatory Visit: Payer: Self-pay

## 2019-06-16 VITALS — BP 136/78 | HR 68 | Temp 97.4°F | Resp 16 | Ht 64.0 in | Wt 151.4 lb

## 2019-06-16 DIAGNOSIS — Z0000 Encounter for general adult medical examination without abnormal findings: Secondary | ICD-10-CM | POA: Diagnosis not present

## 2019-06-16 DIAGNOSIS — Z6825 Body mass index (BMI) 25.0-25.9, adult: Secondary | ICD-10-CM

## 2019-06-16 DIAGNOSIS — E559 Vitamin D deficiency, unspecified: Secondary | ICD-10-CM

## 2019-06-16 DIAGNOSIS — N183 Chronic kidney disease, stage 3 unspecified: Secondary | ICD-10-CM

## 2019-06-16 DIAGNOSIS — Z86718 Personal history of other venous thrombosis and embolism: Secondary | ICD-10-CM

## 2019-06-16 DIAGNOSIS — Z79899 Other long term (current) drug therapy: Secondary | ICD-10-CM

## 2019-06-16 DIAGNOSIS — Z0001 Encounter for general adult medical examination with abnormal findings: Secondary | ICD-10-CM

## 2019-06-16 DIAGNOSIS — E538 Deficiency of other specified B group vitamins: Secondary | ICD-10-CM

## 2019-06-16 DIAGNOSIS — I10 Essential (primary) hypertension: Secondary | ICD-10-CM

## 2019-06-16 DIAGNOSIS — R7309 Other abnormal glucose: Secondary | ICD-10-CM

## 2019-06-16 DIAGNOSIS — Z8542 Personal history of malignant neoplasm of other parts of uterus: Secondary | ICD-10-CM

## 2019-06-16 DIAGNOSIS — M810 Age-related osteoporosis without current pathological fracture: Secondary | ICD-10-CM

## 2019-06-16 DIAGNOSIS — N368 Other specified disorders of urethra: Secondary | ICD-10-CM

## 2019-06-16 DIAGNOSIS — Z85828 Personal history of other malignant neoplasm of skin: Secondary | ICD-10-CM

## 2019-06-16 DIAGNOSIS — I4510 Unspecified right bundle-branch block: Secondary | ICD-10-CM | POA: Insufficient documentation

## 2019-06-16 DIAGNOSIS — G250 Essential tremor: Secondary | ICD-10-CM

## 2019-06-16 DIAGNOSIS — Z136 Encounter for screening for cardiovascular disorders: Secondary | ICD-10-CM

## 2019-06-16 DIAGNOSIS — E782 Mixed hyperlipidemia: Secondary | ICD-10-CM

## 2019-06-16 NOTE — Patient Instructions (Addendum)
Monica Curry , Thank you for taking time to come for your Annual Wellness Visit. I appreciate your ongoing commitment to your health goals. Please review the following plan we discussed and let me know if I can assist you in the future.   These are the goals we discussed: Goals    . DIET - INCREASE WATER INTAKE     Aim for 65 fluid ounces of water or clear liquids daily     . Exercise 150 min/wk Moderate Activity    . LDL CALC < 70       This is a list of the screening recommended for you and due dates:  Health Maintenance  Topic Date Due  . Flu Shot  07/10/2019  . Mammogram  11/28/2019  . Tetanus Vaccine  03/24/2023  . DEXA scan (bone density measurement)  Completed  . Pneumonia vaccines  Discontinued    Know what a healthy weight is for you (roughly BMI <25) and aim to maintain this  Aim for 7+ servings of fruits and vegetables daily  65-80+ fluid ounces of water or unsweet tea for healthy kidneys  Limit to max 1 drink of alcohol per day; avoid smoking/tobacco  Limit animal fats in diet for cholesterol and heart health - choose grass fed whenever available  Avoid highly processed foods, and foods high in saturated/trans fats  Aim for low stress - take time to unwind and care for your mental health  Aim for 150 min of moderate intensity exercise weekly for heart health, and weights twice weekly for bone health  Aim for 7-9 hours of sleep daily     Preventing Chronic Kidney Disease Chronic kidney disease (CKD) occurs when the kidneys are damaged for at least 3 months and do not function effectively. The kidneys are two organs that do many important jobs in the body, including:  Removing waste and extra fluid from the blood to make urine.  Making hormones that maintain the amount of fluid in tissues and blood vessels.  Maintaining the right amount of fluids and electrolytes in the body. A small amount of kidney damage may not cause problems, but a large amount of  damage may make it hard or impossible for the kidneys to work the way they should. CKD gets worse over time (is progressive). You can take steps to prevent CKD or to keep it from getting worse. The best way to prevent kidney damage is to know your risk factors and make changes before you develop symptoms of CKD. How can this condition affect me? At first, you may not notice any signs or symptoms of CKD. Symptoms develop slowly and may not be obvious until the kidney damage becomes severe. If steps are not taken to prevent or slow down the disease, CKD can lead to:  A low red blood cell count (anemia).  Heart disease.  Weak bones.  Nerve damage (neuropathy).  Stroke.  Kidney failure and dialysis. What can increase my risk? You are more likely to develop CKD if you:  Are 2 years of age or older.  Are female.  Are of African American, Native American, Hispanic, Asian, or [place] descent.  Are obese.  Have taken certain medicines for a long time.  Use tobacco or have used it in the past.  Have any of the following conditions: ? Diabetes. ? High blood pressure. ? Heart disease. ? Multiple myeloma. ? An autoimmune disease. ? Frequent urinary tract infections. ? Polycystic kidney disease.  Have a  family history of kidney disease, heart disease, diabetes, or high blood pressure.  Have problems with urine flow that may be caused by: ? Cancer. ? Having kidney stones more than once. ? An enlarged prostate in males. What actions can I take to prevent CKD? Managing conditions that put you at risk  Talk to your health care provider about your kidney health and your risk factors for CKD.  Work with your health care provider to manage conditions such as high blood pressure or diabetes. This may involve taking medicines, eating healthy, or making lifestyle changes to: ? Get high blood pressure down to the target that your health care provider recommends. ? Get blood  sugar (glucose) levels down to the target that your health care provider recommends. Eating and drinking   Follow instructions from your health care provider about diet. This may include: ? Limiting salt (sodium) intake. You should have less than 1 tsp (2,300 mg) of sodium a day. If you have heart disease or high blood pressure, you should have less than  tsp (1,500 mg) of sodium a day. ? Limiting protein intake as told by your health care provider. Avoid high-protein foods. ? Eating a balanced, heart-healthy diet. ? Avoiding foods that are high in potassium and phosphorous.  Limit alcohol. If you drink alcohol: ? Limit how much you use to:  0-1 drink a day for nonpregnant women.  0-2 drinks a day for men. ? Be aware of how much alcohol is in your drink. In the [place] one drink equals one 12 oz bottle of beer (355 mL), one 5 oz glass of wine (148 mL), or one 1 oz glass of hard liquor (44 mL).  If you have diabetes, work with a Financial planner (Firefighter) or a certified diabetes educator to develop a healthy eating plan.  Talk with your health care provider about how much fluid you should drink each day. Lifestyle   Exercise for at least 30 minutes on 5 or more days of the week, or as much as told by your health care provider.  Keep your weight at a healthy level. If you are overweight or obese, lose weight as told by your health care provider.  Do not use any products that contain nicotine or tobacco, such as cigarettes, e-cigarettes, and chewing tobacco. If you need help quitting, ask your health care provider. General instructions  Take over-the-counter and prescription medicines only as told by your health care provider. Do not take any new medicines unless approved by your health care provider.  Use NSAIDs for pain only when necessary. Ask your health care provider about other pain medicines that do not increase your risk of developing CKD.  Have a yearly  physical exam.  Learn about your family's medical history. Talk to your relatives and siblings about diabetes, heart disease, and high blood pressure. Where to find more information Learn more about CKD and how to prevent CKD from:  [place]: www.kidney.org  American Association of Kidney Patients: BombTimer.gl  American Diabetes Association: www.diabetes.org Summary  Symptoms of CKD develop slowly and may not be obvious until the kidney damage becomes severe.  The best way to prevent kidney damage is to know your risk factors and make nutrition and lifestyle changes before you develop symptoms of CKD.  Follow instructions from your health care provider about diet, which may include limiting how much salt, protein, and alcohol you consume.  Work with your health care provider to keep your blood pressure  and blood sugar levels within the recommended range. This information is not intended to replace advice given to you by your health care provider. Make sure you discuss any questions you have with your health care provider. Document Released: 12/22/2015 Document Revised: 03/19/2019 Document Reviewed: 12/30/2018 Elsevier Patient Education  2020 [employer].

## 2019-06-17 LAB — COMPLETE METABOLIC PANEL WITH GFR
AG Ratio: 1.5 (calc) (ref 1.0–2.5)
ALT: 9 U/L (ref 6–29)
AST: 14 U/L (ref 10–35)
Albumin: 4 g/dL (ref 3.6–5.1)
Alkaline phosphatase (APISO): 37 U/L (ref 37–153)
BUN/Creatinine Ratio: 19 (calc) (ref 6–22)
BUN: 21 mg/dL (ref 7–25)
CO2: 26 mmol/L (ref 20–32)
Calcium: 9.8 mg/dL (ref 8.6–10.4)
Chloride: 102 mmol/L (ref 98–110)
Creat: 1.12 mg/dL — ABNORMAL HIGH (ref 0.60–0.88)
GFR, Est African American: 52 mL/min/{1.73_m2} — ABNORMAL LOW (ref >=60)
GFR, Est Non African American: 45 mL/min/{1.73_m2} — ABNORMAL LOW (ref >=60)
Globulin: 2.7 g/dL (calc) (ref 1.9–3.7)
Glucose, Bld: 89 mg/dL (ref 65–99)
Potassium: 4.5 mmol/L (ref 3.5–5.3)
Sodium: 137 mmol/L (ref 135–146)
Total Bilirubin: 0.6 mg/dL (ref 0.2–1.2)
Total Protein: 6.7 g/dL (ref 6.1–8.1)

## 2019-06-17 LAB — LIPID PANEL
Cholesterol: 122 mg/dL (ref <200)
HDL: 46 mg/dL — ABNORMAL LOW (ref >=50)
LDL Cholesterol (Calc): 57 mg/dL (calc)
Non-HDL Cholesterol (Calc): 76 mg/dL (calc) (ref <130)
Total CHOL/HDL Ratio: 2.7 (calc) (ref <5.0)
Triglycerides: 106 mg/dL (ref <150)

## 2019-06-17 LAB — CBC WITH DIFFERENTIAL/PLATELET
Absolute Monocytes: 510 cells/uL (ref 200–950)
Basophils Absolute: 62 cells/uL (ref 0–200)
Basophils Relative: 1.1 %
Eosinophils Absolute: 162 cells/uL (ref 15–500)
Eosinophils Relative: 2.9 %
HCT: 42.3 % (ref 35.0–45.0)
Hemoglobin: 13.8 g/dL (ref 11.7–15.5)
Lymphs Abs: 2447 cells/uL (ref 850–3900)
MCH: 28.6 pg (ref 27.0–33.0)
MCHC: 32.6 g/dL (ref 32.0–36.0)
MCV: 87.6 fL (ref 80.0–100.0)
MPV: 10.3 fL (ref 7.5–12.5)
Monocytes Relative: 9.1 %
Neutro Abs: 2419 cells/uL (ref 1500–7800)
Neutrophils Relative %: 43.2 %
Platelets: 264 10*3/uL (ref 140–400)
RBC: 4.83 10*6/uL (ref 3.80–5.10)
RDW: 14.1 % (ref 11.0–15.0)
Total Lymphocyte: 43.7 %
WBC: 5.6 10*3/uL (ref 3.8–10.8)

## 2019-06-17 LAB — URINALYSIS, ROUTINE W REFLEX MICROSCOPIC
Bilirubin Urine: NEGATIVE
Glucose, UA: NEGATIVE
Hgb urine dipstick: NEGATIVE
Ketones, ur: NEGATIVE
Leukocytes,Ua: NEGATIVE
Nitrite: NEGATIVE
Protein, ur: NEGATIVE
Specific Gravity, Urine: 1.005 (ref 1.001–1.03)
pH: 5 (ref 5.0–8.0)

## 2019-06-17 LAB — TSH: TSH: 2.01 mIU/L (ref 0.40–4.50)

## 2019-06-17 LAB — MICROALBUMIN / CREATININE URINE RATIO
Creatinine, Urine: 42 mg/dL (ref 20–275)
Microalb Creat Ratio: 12 mcg/mg creat (ref <30)
Microalb, Ur: 0.5 mg/dL

## 2019-06-17 LAB — HEMOGLOBIN A1C
Hgb A1c MFr Bld: 5.5 % of total Hgb (ref <5.7)
Mean Plasma Glucose: 111 (calc)
eAG (mmol/L): 6.2 (calc)

## 2019-06-17 LAB — VITAMIN D 25 HYDROXY (VIT D DEFICIENCY, FRACTURES): Vit D, 25-Hydroxy: 85 ng/mL (ref 30–100)

## 2019-06-17 LAB — MAGNESIUM: Magnesium: 1.7 mg/dL (ref 1.5–2.5)

## 2019-06-17 LAB — VITAMIN B12: Vitamin B-12: 501 pg/mL (ref 200–1100)

## 2019-07-02 ENCOUNTER — Telehealth: Payer: Self-pay

## 2019-07-02 NOTE — Telephone Encounter (Signed)

## 2019-07-04 NOTE — Progress Notes (Signed)
Cardiology Office Note    Date:  8/41/3244   ID:  Monica Curry, DOB [birth year], MRN [id number]  PCP:  Unk Pinto, MD  Cardiologist:  Dr. Harrington Challenger  Chief Complaint: 6 months follow up  History of Present Illness:   Monica Curry is a 85 y.o. female with hx of TIA with expressive aphasia, HTN,  SVT, CKD stage III, hx of pulmonary embolism, DVT, essential tremors and presyncope seen for follow up.  Hx of presyncope while driving and teeth cleaning by dentist.  Event monitor for TIA showed SVT >> placed on diltiazem. Changed to inderal to held with essential tremors.   Last seen by Dr. Harrington Challenger 11/2018.  Here today for follow up. The patient denies nausea, vomiting, fever, chest pain, palpitations, shortness of breath, orthopnea, PND, dizziness, syncope, cough, congestion, abdominal pain, hematochezia, melena, lower extremity edema.  Past Medical History:  Diagnosis Date  . C. difficile colitis   . Cancer [facility])    Endometrial cancer  . Essential and other specified forms of tremor   . Osteoporosis 11/2017   T score -2.7 stable from prior DEXA 2016  . Personal history of venous thrombosis and embolism 1997  . Stroke ([place])   . Thrush   . TIA (transient ischemic attack)     Past Surgical History:  Procedure Laterality Date  . APPENDECTOMY    . [address]  . TONSILLECTOMY AND ADENOIDECTOMY    . TOTAL ABDOMINAL HYSTERECTOMY W/ BILATERAL SALPINGOOPHORECTOMY  1997    Current Medications: Prior to Admission medications   Medication Sig Start Date End Date Taking? Authorizing Provider  aspirin 325 MG tablet Take 325 mg by mouth daily.    [provider]  atorvastatin (LIPITOR) 10 MG tablet Take 1 tablet (10 mg total) by mouth daily. 06/02/19   Unk Pinto, MD  Calcium-Magnesium-Vitamin D 300-20-200 MG-MG-UNIT CHEW Chew 1 tablet daily by mouth.     [provider]  Cholecalciferol (VITAMIN D3) 5000 units TABS Take 2 tablets by mouth daily.      [provider]  clonazePAM (KLONOPIN) 0.5 MG tablet Take 1 pill in AM, 1/2 pill at noon and 1/2 pill at night. 01/18/19   [name], MD  Magnesium 250 MG TABS Takes 1 tablet daily. 12/08/18   Unk Pinto, MD  propranolol (INDERAL) 40 MG tablet TAKE 1 TABLET BY MOUTH FOUR TIMES DAILY WITH MEALS AND BEDTIME FOR BLOOD PRESSURE AND TREMOR 02/10/19   Unk Pinto, MD  vitamin B-12 (CYANOCOBALAMIN) 500 MCG tablet Take 500 mcg by mouth daily. Sublingual    [provider]    Allergies:   Codeine, Meloxicam, Mysoline [primidone], Pneumovax 23 [pneumococcal vac polyvalent], and Toradol [ketorolac tromethamine]   Social History   Socioeconomic History  . Marital status: Widowed    Spouse name: Not on file  . Number of children: 1  . Years of education: Not on file  . Highest education level: Not on file  Occupational History  . Not on file  Social Needs  . Financial resource strain: Not on file  . Food insecurity    Worry: Not on file    Inability: Not on file  . Transportation needs    Medical: Not on file    Non-medical: Not on file  Tobacco Use  . Smoking status: Never Smoker  . Smokeless tobacco: Never Used  Substance and Sexual Activity  . Alcohol use: Yes    Alcohol/week: 0.0 standard drinks    Comment: ocassionally  .  Drug use: No  . Sexual activity: Not Currently    Birth control/protection: Post-menopausal, Surgical    Comment: HYST-1st intercourse 85 yo-Fewer than 5 partners  Lifestyle  . Physical activity    Days per week: Not on file    Minutes per session: Not on file  . Stress: Not on file  Relationships  . Social Herbalist on phone: Not on file    Gets together: Not on file    Attends religious service: Not on file    Active member of club or organization: Not on file    Attends meetings of clubs or organizations: Not on file    Relationship status: Not on file  Other Topics Concern  . Not on file  Social History  Narrative  . Not on file     Family History:  The patient's family history includes Blindness in her sister; Cancer in her father and mother; Dementia in her mother; Diabetes in her sister; Heart disease in her brother, brother, brother, sister, and sister; Hypertension in her sister; Kidney failure in her brother and sister; Prostate cancer in her brother and brother; Skin cancer in her sister.   ROS:   Please see the history of present illness.    ROS All other systems reviewed and are negative.   PHYSICAL EXAM:   VS:  BP (!) 142/90   Pulse 65   Ht 5\' 4"  (1.626 m)   Wt 151 lb 12.8 oz (68.9 kg)   BMI 26.06 kg/m    Repeat BP 128/80  GEN: Well nourished, well developed, in no acute distress  HEENT: normal  Neck: no JVD, carotid bruits, or masses Cardiac: RRR; no murmurs, rubs, or gallops,no edema  Respiratory:  clear to auscultation bilaterally, normal work of breathing GI: soft, nontender, nondistended, + BS MS: no deformity or atrophy  Skin: warm and dry, no rash Neuro:  Alert and Oriented x 3, Strength and sensation are intact Psych: euthymic mood, full affect  Wt Readings from Last 3 Encounters:  07/05/19 151 lb 12.8 oz (68.9 kg)  06/16/19 151 lb 6.4 oz (68.7 kg)  03/17/19 150 lb (68 kg)      Studies/Labs Reviewed:   EKG:  EKG is ordered today.  The ekg ordered today demonstrates SR, HR 65 bpm, RBBB, PR interval 222ms  Recent Labs: 06/16/2019: ALT 9; BUN 21; Creat 1.12; Hemoglobin 13.8; Magnesium 1.7; Platelets 264; Potassium 4.5; Sodium 137; TSH 2.01   Lipid Panel    Component Value Date/Time   CHOL 122 06/16/2019 1631   TRIG 106 06/16/2019 1631   HDL 46 (L) 06/16/2019 1631   CHOLHDL 2.7 06/16/2019 1631   VLDL 24 10/24/2017 0629   LDLCALC 57 06/16/2019 1631    Additional studies/ records that were reviewed today include:   Stress test 04/2018  Nuclear stress EF: 76%.  There was no ST segment deviation noted during stress.  No T wave inversion was noted  during stress.  The study is normal.  This is a low risk study.  The left ventricular ejection fraction is hyperdynamic (>65%).   Low risk stress nuclear study with normal perfusion and normal left ventricular regional and global systolic function.  Monitor 01/2018 NSR with sinus bradycardia 2. Atrial tachycardia at 160 is present 3. Rare PVC's 4. Noise artifact  Echo 10/2017 Study Conclusions  - Left ventricle: The cavity size was normal. Systolic function was   normal. The estimated ejection fraction was in the range of  55%   to 60%. Wall motion was normal; there were no regional wall   motion abnormalities. Doppler parameters are consistent with   abnormal left ventricular relaxation (grade 1 diastolic   dysfunction). - Mitral valve: There was mild regurgitation. - Left atrium: The atrium was moderately dilated.  Impressions:  - No cardiac source of emboli was indentified.  ASSESSMENT & PLAN:    1. HTN Stable on current medications.   2. Presyncope - No reoccurance  3. SVT - No palpitations. Continue Inderal.   4. Hx of TIA - On high dose ASA and statin.   Medication Adjustments/Labs and Tests Ordered: Current medicines are reviewed at length with the patient today.  Concerns regarding medicines are outlined above.  Medication changes, Labs and Tests ordered today are listed in the Patient Instructions below. Patient Instructions  Medication Instructions:  Your physician recommends that you continue on your current medications as directed. Please refer to the Current Medication list given to you today.  If you need a refill on your cardiac medications before your next appointment, please call your pharmacy.   Lab work: None ordered  If you have labs (blood work) drawn today and your tests are completely normal, you will receive your results only by: Marland Kitchen MyChart Message (if you have MyChart) OR . A paper copy in the mail If you have any lab test that is  abnormal or we need to change your treatment, we will call you to review the results.  Testing/Procedures: None ordered  Follow-Up: At [facility], you and your health needs are our priority.  As part of our continuing mission to provide you with exceptional heart care, we have created designated Provider Care Teams.  These Care Teams include your primary Cardiologist (physician) and Advanced Practice Providers (APPs -  Physician Assistants and Nurse Practitioners) who all work together to provide you with the care you need, when you need it. You will need a follow up appointment in:  6 months.  Please call our office 2 months in advance to schedule this appointment.  You may see Dorris Carnes, MD or one of the following Advanced Practice Providers on your designated Care Team: Richardson Dopp, PA-C [place], [place] . Daune Perch, NP  Any Other Special Instructions Will Be Listed Below (If Applicable).         Jarrett Soho, [place]  07/05/2019 9:39 AM    [facility] [place], [place], [place]  [address] Phone: [phone]; Fax: [phone]

## 2019-07-05 ENCOUNTER — Other Ambulatory Visit: Payer: Self-pay

## 2019-07-05 ENCOUNTER — Encounter: Payer: Self-pay | Admitting: Physician Assistant

## 2019-07-05 ENCOUNTER — Ambulatory Visit (INDEPENDENT_AMBULATORY_CARE_PROVIDER_SITE_OTHER): Payer: Medicare Other | Admitting: Physician Assistant

## 2019-07-05 VITALS — BP 142/90 | HR 65 | Ht 64.0 in | Wt 151.8 lb

## 2019-07-05 DIAGNOSIS — G459 Transient cerebral ischemic attack, unspecified: Secondary | ICD-10-CM | POA: Diagnosis not present

## 2019-07-05 DIAGNOSIS — I10 Essential (primary) hypertension: Secondary | ICD-10-CM

## 2019-07-05 DIAGNOSIS — I471 Supraventricular tachycardia: Secondary | ICD-10-CM

## 2019-07-05 NOTE — Patient Instructions (Signed)
Medication Instructions:  Your physician recommends that you continue on your current medications as directed. Please refer to the Current Medication list given to you today.  If you need a refill on your cardiac medications before your next appointment, please call your pharmacy.   Lab work: None ordered  If you have labs (blood work) drawn today and your tests are completely normal, you will receive your results only by: . MyChart Message (if you have MyChart) OR . A paper copy in the mail If you have any lab test that is abnormal or we need to change your treatment, we will call you to review the results.  Testing/Procedures: None ordered   Follow-Up: At [facility], you and your health needs are our priority.  As part of our continuing mission to provide you with exceptional heart care, we have created designated Provider Care Teams.  These Care Teams include your primary Cardiologist (physician) and Advanced Practice Providers (APPs -  Physician Assistants and Nurse Practitioners) who all work together to provide you with the care you need, when you need it. You will need a follow up appointment in:  6 months.  Please call our office 2 months in advance to schedule this appointment.  You may see Paula Ross, MD or one of the following Advanced Practice Providers on your designated Care Team: Scott Weaver, PA-C Vin Bhagat, PA-C . Janine Hammond, NP  Any Other Special Instructions Will Be Listed Below (If Applicable).    

## 2019-07-19 ENCOUNTER — Other Ambulatory Visit: Payer: Self-pay | Admitting: Neurology

## 2019-07-19 DIAGNOSIS — G250 Essential tremor: Secondary | ICD-10-CM

## 2019-07-21 ENCOUNTER — Encounter: Payer: Self-pay | Admitting: Adult Health

## 2019-07-21 ENCOUNTER — Ambulatory Visit (INDEPENDENT_AMBULATORY_CARE_PROVIDER_SITE_OTHER): Payer: Medicare Other | Admitting: Adult Health

## 2019-07-21 ENCOUNTER — Other Ambulatory Visit: Payer: Self-pay

## 2019-07-21 VITALS — BP 129/79 | HR 70 | Temp 97.8°F | Ht 64.0 in | Wt 152.6 lb

## 2019-07-21 DIAGNOSIS — G250 Essential tremor: Secondary | ICD-10-CM

## 2019-07-21 NOTE — Patient Instructions (Addendum)
Your Plan:  Continue Clonazepam  If your symptoms worsen or you develop new symptoms please let us know.    Thank you for coming to see [place] at [facility]. I hope we have been able to provide you high quality care today.  You may receive a patient satisfaction survey over the next few weeks. We would appreciate your feedback and comments so that we may continue to improve ourselves and the health of our patients.

## 2019-07-21 NOTE — Progress Notes (Addendum)
PATIENT: Monica Curry DOB: [birth year]  REASON FOR VISIT: follow up HISTORY FROM: patient  HISTORY OF PRESENT ILLNESS: Today 07/21/19:  Monica Curry is an 85 year old female with a history of essential tremor.  She returns today for follow-up.  She remains on clonazepam for her tremor.  She reports that this is beneficial.  She did see Dr. Carles Collet for evaluation of DBS however the patient is not interested.  She states that with the pandemic in her staying at home she does not have to worry about the social aspect of the tremor.  She states that when she is in social situations she has high anxiety due to fear of people noticing her tremor.  In turn this makes her tremor worse.  She states that she notices that when she is holding a glass or feeding herself the tremor is there.  She has looked into getting the glove recommended by Dr. Carles Collet however it was expensive and she has deferred for now.  She mentions that she saw her PCP and they suggested Topamax or gabapentin for her tremor.  She returns today for an evaluation.  HISTORY (copied from Dr. Guadelupe Sabin note) 01/18/2019:She reports some increase in stress. She helps take care of a friend, who is not doing so well. She has worried about the tremor being worse, some days are better than others. She had a consultation at [facility] years ago. She would be willing to seek consultation for DBS. She would like to stay in [place].   The patient's allergies, current medications, family history, past medical history, past social history, past surgical history and problem list were reviewed and updated as appropriate   REVIEW OF SYSTEMS: Out of a complete 14 system review of symptoms, the patient complains only of the following symptoms, and all other reviewed systems are negative.  Dizziness, tremors  ALLERGIES: Allergies  Allergen Reactions  . Codeine Hives  . Meloxicam Nausea Only    Gastritis and nausea  . Mysoline [Primidone]   . Pneumovax  23 [Pneumococcal Vac Polyvalent]     Red, raised area.  Knot at site of injection  . Toradol [Ketorolac Tromethamine] Swelling    Localized redness at injection site.      HOME MEDICATIONS: Outpatient Medications Prior to Visit  Medication Sig Dispense Refill  . aspirin 325 MG tablet Take 325 mg by mouth daily.    Marland Kitchen atorvastatin (LIPITOR) 10 MG tablet Take 1 tablet (10 mg total) by mouth daily. 90 tablet 1  . Calcium-Magnesium-Vitamin D 300-20-200 MG-MG-UNIT CHEW Chew 1 tablet daily by mouth.     . Cholecalciferol (VITAMIN D3) 5000 units TABS Take 2 tablets by mouth daily.     . clonazePAM (KLONOPIN) 0.5 MG tablet TAKE 1 TABLET BY MOUTH EVERY MORNING, 1/2 TABLET AT NOON, AND 1/2 AT NIGHT 60 tablet 0  . Magnesium 250 MG TABS Take 250 mg by mouth 2 (two) times a day.    . propranolol (INDERAL) 40 MG tablet TAKE 1 TABLET BY MOUTH FOUR TIMES DAILY WITH MEALS AND BEDTIME FOR BLOOD PRESSURE AND TREMOR 360 tablet 3  . vitamin B-12 (CYANOCOBALAMIN) 500 MCG tablet Take 500 mcg by mouth daily. Sublingual     No facility-administered medications prior to visit.     PAST MEDICAL HISTORY: Past Medical History:  Diagnosis Date  . C. difficile colitis   . Cancer [facility])    Endometrial cancer  . Essential and other specified forms of tremor   . Osteoporosis 11/2017  T score -2.7 stable from prior DEXA 2016  . Personal history of venous thrombosis and embolism 1997  . Stroke ([place])   . Thrush   . TIA (transient ischemic attack)     PAST SURGICAL HISTORY: Past Surgical History:  Procedure Laterality Date  . APPENDECTOMY    . [place]  . TONSILLECTOMY AND ADENOIDECTOMY    . TOTAL ABDOMINAL HYSTERECTOMY W/ BILATERAL SALPINGOOPHORECTOMY  1997    FAMILY HISTORY: Family History  Problem Relation Age of Onset  . Cancer Mother        Unknown type  . Dementia Mother   . Cancer Father        Bone  . Heart disease Brother   . Prostate cancer Brother        Prostate  . Heart  disease Brother   . Kidney failure Brother   . Prostate cancer Brother        Prostate  . Heart disease Sister   . Blindness Sister        temporal arteritis  . Skin cancer Sister        basal and melanoma  . Heart disease Sister   . Kidney failure Sister        Kidney transplant  . Diabetes Sister   . Hypertension Sister   . Heart disease Brother     SOCIAL HISTORY: Social History   Socioeconomic History  . Marital status: Widowed    Spouse name: Not on file  . Number of children: 1  . Years of education: Not on file  . Highest education level: Not on file  Occupational History  . Not on file  Social Needs  . Financial resource strain: Not on file  . Food insecurity    Worry: Not on file    Inability: Not on file  . Transportation needs    Medical: Not on file    Non-medical: Not on file  Tobacco Use  . Smoking status: Never Smoker  . Smokeless tobacco: Never Used  Substance and Sexual Activity  . Alcohol use: Yes    Alcohol/week: 0.0 standard drinks    Comment: ocassionally  . Drug use: No  . Sexual activity: Not Currently    Birth control/protection: Post-menopausal, Surgical    Comment: HYST-1st intercourse 85 yo-Fewer than 5 partners  Lifestyle  . Physical activity    Days per week: Not on file    Minutes per session: Not on file  . Stress: Not on file  Relationships  . Social Herbalist on phone: Not on file    Gets together: Not on file    Attends religious service: Not on file    Active member of club or organization: Not on file    Attends meetings of clubs or organizations: Not on file    Relationship status: Not on file  . Intimate partner violence    Fear of current or ex partner: Not on file    Emotionally abused: Not on file    Physically abused: Not on file    Forced sexual activity: Not on file  Other Topics Concern  . Not on file  Social History Narrative  . Not on file      PHYSICAL EXAM  Vitals:   07/21/19 0925   BP: 129/79  Pulse: 70  Temp: 97.8 F (36.6 C)  Weight: 152 lb 9.6 oz (69.2 kg)  Height: 5\' 4"  (1.626 m)   Body mass index is  26.19 kg/m.  Generalized: Well developed, in no acute distress   Neurological examination  Mentation: Alert oriented to time, place, history taking. Follows all commands speech and language fluent Cranial nerve II-XII: Pupils were equal round reactive to light. Extraocular movements were full, visual field were full on confrontational test. Facial sensation and strength were normal. Uvula tongue midline. Head turning and shoulder shrug  were normal and symmetric. Motor: The motor testing reveals 5 over 5 strength of all 4 extremities. Good symmetric motor tone is noted throughout.  Sensory: Sensory testing is intact to soft touch on all 4 extremities. No evidence of extinction is noted.  Coordination: Cerebellar testing reveals good finger-nose-finger and heel-to-shin bilaterally.  Gait and station: Gait is normal.  Reflexes: Deep tendon reflexes are symmetric and normal bilaterally.   DIAGNOSTIC DATA (LABS, IMAGING, TESTING) - I reviewed patient records, labs, notes, testing and imaging myself where available.  Lab Results  Component Value Date   WBC 5.6 06/16/2019   HGB 13.8 06/16/2019   HCT 42.3 06/16/2019   MCV 87.6 06/16/2019   PLT 264 06/16/2019      Component Value Date/Time   NA 137 06/16/2019 1631   K 4.5 06/16/2019 1631   CL 102 06/16/2019 1631   CO2 26 06/16/2019 1631   GLUCOSE 89 06/16/2019 1631   BUN 21 06/16/2019 1631   CREATININE 1.12 (H) 06/16/2019 1631   CALCIUM 9.8 06/16/2019 1631   PROT 6.7 06/16/2019 1631   ALBUMIN 4.4 10/23/2017 1807   AST 14 06/16/2019 1631   ALT 9 06/16/2019 1631   ALKPHOS 37 (L) 10/23/2017 1807   BILITOT 0.6 06/16/2019 1631   GFRNONAA 45 (L) 06/16/2019 1631   GFRAA 52 (L) 06/16/2019 1631   Lab Results  Component Value Date   CHOL 122 06/16/2019   HDL 46 (L) 06/16/2019   LDLCALC 57 06/16/2019    TRIG 106 06/16/2019   CHOLHDL 2.7 06/16/2019   Lab Results  Component Value Date   HGBA1C 5.5 06/16/2019   Lab Results  Component Value Date   VITAMINB12 501 06/16/2019   Lab Results  Component Value Date   TSH 2.01 06/16/2019      ASSESSMENT AND PLAN 85 y.o. year old female  has a past medical history of C. difficile colitis, Cancer ([place]), Essential and other specified forms of tremor, Osteoporosis (11/2017), Personal history of venous thrombosis and embolism (1997), Stroke ([place]), Thrush, and TIA (transient ischemic attack). here with:  1.  Essential tremor  Overall the patient has remained stable.  She will continue on clonazepam.  I advised that we could try Topamax or gabapentin however in my experience it typically does not offer significant benefit but I am amenable to prescribing if she wants to try it.  She deferred for now.  She is advised that if her symptoms worsen or she develops new symptoms she should let us know.  She will follow-up in 6 months or sooner if needed   I spent 15 minutes with the patient. 50% of this time was spent discussing her plan of care   [name], MSN, NP-C 07/21/2019, 9:06 AM [facility] [address], [place],  [address] [phone]  I reviewed the above note and documentation by the Nurse Practitioner and agree with the history, exam, assessment and plan as outlined above. I was available for consultation. [name], MD, PhD [facility])

## 2019-07-23 ENCOUNTER — Telehealth: Payer: Self-pay | Admitting: Adult Health

## 2019-07-23 NOTE — Telephone Encounter (Signed)
Pt is asking for a call from her RN to discuss her refill of clonazePAM (KLONOPIN) 0.5 MG tablet

## 2019-07-26 NOTE — Telephone Encounter (Signed)
I can give refills the next time its due. Dr. Krista Blue refilled on 8/11 and that's why I didn't send in another script

## 2019-07-26 NOTE — Telephone Encounter (Signed)
Pt asking if this will be a monthly call to [place] for klonopin or are we able to give her refills until seen in 6 months.  Please advise. I relayed that this is a controlled substance.  We do check with drug registry.  I do have some providers that do refills.  Will ask.

## 2019-07-27 NOTE — Telephone Encounter (Signed)
No answer this am.

## 2019-07-27 NOTE — Telephone Encounter (Signed)
I called pt and relayed that MM/NP will put additional refills the next time it comes back to [place].  She verbalized understanding.

## 2019-07-29 ENCOUNTER — Ambulatory Visit (INDEPENDENT_AMBULATORY_CARE_PROVIDER_SITE_OTHER): Payer: Medicare Other | Admitting: Adult Health Nurse Practitioner

## 2019-07-29 ENCOUNTER — Encounter: Payer: Self-pay | Admitting: Adult Health Nurse Practitioner

## 2019-07-29 ENCOUNTER — Other Ambulatory Visit: Payer: Self-pay

## 2019-07-29 VITALS — BP 114/68 | HR 68 | Temp 96.4°F | Ht 64.0 in | Wt 153.0 lb

## 2019-07-29 DIAGNOSIS — F419 Anxiety disorder, unspecified: Secondary | ICD-10-CM | POA: Diagnosis not present

## 2019-07-29 DIAGNOSIS — G250 Essential tremor: Secondary | ICD-10-CM | POA: Diagnosis not present

## 2019-07-29 DIAGNOSIS — I10 Essential (primary) hypertension: Secondary | ICD-10-CM | POA: Diagnosis not present

## 2019-07-29 DIAGNOSIS — L249 Irritant contact dermatitis, unspecified cause: Secondary | ICD-10-CM

## 2019-07-29 NOTE — Progress Notes (Signed)
Assessment and Plan:  Monica Curry was seen today for rash.  Discussed poison ivy vs bug/spider bite reaction.    Diagnoses and all orders for this visit:  Irritant contact dermatitis, unspecified trigger Topical OTC Hydrocortisone cream 3-4 times a day for itch Monitor area Try Calmospetine, topical barrier for itch and moisture. Consider benedryl or zyrtec nightly for itching.  Essential hypertension Controlled today Taking propanolol 40mg , also for tremors. Monitor blood pressure at home; call if consistently over 130/80 Continue DASH diet.   Reminder to go to the ER if any CP, SOB, nausea, dizziness, severe HA, changes vision/speech, left arm numbness and tingling and jaw pain.  Essential tremor Doing well Taking propanolol 40mg , one tablet four times a day  Anxiety Doing well Continue Klonopin 0.5mg , 1 tablet in am, 1/2 tab in afternoon and evening Discussed stress management techniques discussed, increase water, good sleep hygiene discussed, increase exercise, and increase veggies.      Further disposition pending results of labs. Discussed med's effects and SE's.   Over 30 minutes of exam, counseling, chart review, and critical decision making was performed.   Future Appointments  Date Time Provider [place]  09/21/2019  2:30 PM Unk Pinto, MD [facility] None  11/08/2019 10:30 AM Fontaine, Belinda Block, MD [facility] [name]  12/27/2019  2:30 PM [name], NP [facility] None  01/27/2020  9:30 AM [name], NP [facility] None  03/27/2020  2:00 PM Vicie Mutters, PA-C [facility] None  06/29/2020  2:00 PM [name], NP [facility] None    ------------------------------------------------------------------------------------------------------------------   HPI 85 y.o.female presents for rash that started one week ago. It started on her left antecubital.  She reports she was wearing a blouse that had elastic around the antecubital.  She removed the blouse.   She put alcohol on the areas.  She has used neosporin that has not helped.  She reports it is very itchy.  It is on her left arm raised.  She has also used ice that has helped only while the ice is applied. She denies any changes to her body wash, lotion laundry soaps fabric softeners or dry sheets.  She denies any new clothing or medications.  Past Medical History:  Diagnosis Date  . C. difficile colitis   . Cancer [facility])    Endometrial cancer  . Essential and other specified forms of tremor   . Osteoporosis 11/2017   T score -2.7 stable from prior DEXA 2016  . Personal history of venous thrombosis and embolism 1997  . Stroke ([place])   . Thrush   . TIA (transient ischemic attack)      Allergies  Allergen Reactions  . Codeine Hives  . Meloxicam Nausea Only    Gastritis and nausea  . Mysoline [Primidone]   . Pneumovax 23 [Pneumococcal Vac Polyvalent]     Red, raised area.  Knot at site of injection  . Toradol [Ketorolac Tromethamine] Swelling    Localized redness at injection site.      Current Outpatient Medications on File Prior to Visit  Medication Sig  . aspirin 325 MG tablet Take 325 mg by mouth daily.  Marland Kitchen atorvastatin (LIPITOR) 10 MG tablet Take 1 tablet (10 mg total) by mouth daily.  . Calcium-Magnesium-Vitamin D 300-20-200 MG-MG-UNIT CHEW Chew 1 tablet daily by mouth.   . Cholecalciferol (VITAMIN D3) 5000 units TABS Take 2 tablets by mouth daily.   . clonazePAM (KLONOPIN) 0.5 MG tablet TAKE 1 TABLET BY MOUTH EVERY MORNING, 1/2 TABLET AT NOON, AND 1/2  AT NIGHT  . Magnesium 250 MG TABS Take 250 mg by mouth 2 (two) times a day.  . propranolol (INDERAL) 40 MG tablet TAKE 1 TABLET BY MOUTH FOUR TIMES DAILY WITH MEALS AND BEDTIME FOR BLOOD PRESSURE AND TREMOR  . vitamin B-12 (CYANOCOBALAMIN) 500 MCG tablet Take 500 mcg by mouth daily. Sublingual   No current facility-administered medications on file prior to visit.     ROS: all negative except above.   Physical Exam:  BP  114/68   Pulse 68   Temp (!) 96.4 F (35.8 C)   Ht 5\' 4"  (1.626 m)   Wt 153 lb (69.4 kg)   SpO2 97%   BMI 26.26 kg/m   General Appearance: Well nourished, in no apparent distress. Eyes: PERRLA, EOMs, conjunctiva no swelling or erythema Sinuses: No Frontal/maxillary tenderness ENT: Ext aud canals clear, TMs without erythema, bulging.  Hearing normal.  Neck: Supple, thyroid normal.  Respiratory: Respiratory effort normal, BS equal bilaterally without rales, rhonchi, wheezing or stridor.  Cardio: RRR with no MRGs. Brisk peripheral pulses without edema.  Abdomen: Soft, + BS.  Non tender, no guarding, rebound, hernias, masses. Lymphatics: Non tender without lymphadenopathy.  Musculoskeletal: Full ROM, 5/5 strength, normal gait.  Skin: Warm, dry.  Left antecubital, raised, erythematous, small raised vesicle patch to anterior forearm and a single red raised area bicep. Neuro: Cranial nerves intact. Normal muscle tone, no cerebellar symptoms. Sensation intact.  Psych: Awake and oriented X 3, normal affect, Insight and Judgment appropriate.     Garnet Sierras, NP 12:37 PM [facility]

## 2019-07-29 NOTE — Patient Instructions (Signed)
Today you are being treated for contact dermatitis.  Use topical Hydrocortisone to the rash 3-4 times a day.  This will help with the itch as well as the inflammation.  You can also Try Calmoseptine, a topical barrier cream to help with itching and Mositurizing.   Try Zyrtec or Benadryl at night to help with the itch.   Please call or return with any new or worsening symptoms.

## 2019-07-31 ENCOUNTER — Other Ambulatory Visit: Payer: Self-pay | Admitting: Internal Medicine

## 2019-07-31 DIAGNOSIS — B029 Zoster without complications: Secondary | ICD-10-CM

## 2019-07-31 MED ORDER — BETAMETHASONE DIPROPIONATE 0.05 % EX OINT: TOPICAL_OINTMENT | Freq: Two times a day (BID) | CUTANEOUS | 1 refills | Status: DC

## 2019-07-31 MED ORDER — DEXAMETHASONE 4 MG PO TABS: ORAL_TABLET | ORAL | 0 refills | Status: DC

## 2019-08-02 ENCOUNTER — Telehealth: Payer: Self-pay | Admitting: *Deleted

## 2019-08-02 NOTE — Telephone Encounter (Signed)
Patient called and reported her rash is slightly improved, but has a a headache and increased tremor, since starting the Dexamethasone 20 mg on a taper dose.  Per Dr Melford Aase, do not take the Dexamethasone today and restart at 1/2 dose on 08/04/2019.  Patient agreed and will call back if tremors do not improve.

## 2019-08-05 ENCOUNTER — Telehealth: Payer: Self-pay | Admitting: *Deleted

## 2019-08-05 NOTE — Telephone Encounter (Signed)
Patient called and reported she is still having problems, even taking 1/2 dose of the Dexamethazone 4 mg tablets.  She is nervous, having trouble sleeping and has hot flashes.  She states her rash is gone.  Per Dr Melford Aase, it is OK to stop the medication.  Patient is aware.

## 2019-08-20 ENCOUNTER — Other Ambulatory Visit: Payer: Self-pay | Admitting: Neurology

## 2019-08-20 DIAGNOSIS — G250 Essential tremor: Secondary | ICD-10-CM

## 2019-08-20 NOTE — Telephone Encounter (Signed)
Patient called in for refill of clonazepam.  Meds ordered this encounter  Medications  . clonazePAM (KLONOPIN) 0.5 MG tablet    Sig: TAKE 1 TABLET BY MOUTH EVERY MORNING,1/2 TABLET AT NOON AND 1/2 AT NIGHT    Dispense:  60 tablet    Refill:  [place], MD [id number], [id number] PM Certified in Neurology, Neurophysiology and Neuroimaging  [facility] [address], [place], [place] [address] [phone]

## 2019-09-14 ENCOUNTER — Other Ambulatory Visit: Payer: Self-pay

## 2019-09-14 ENCOUNTER — Ambulatory Visit (INDEPENDENT_AMBULATORY_CARE_PROVIDER_SITE_OTHER): Payer: Medicare Other

## 2019-09-14 DIAGNOSIS — Z23 Encounter for immunization: Secondary | ICD-10-CM | POA: Diagnosis not present

## 2019-09-14 NOTE — Progress Notes (Signed)
Patient presents to the office forHDFlu Vaccine. Vaccine administered toRIGHT Deltoid withoutanycomplication. Temperature taken and recorded 

## 2019-09-16 ENCOUNTER — Encounter: Payer: Self-pay | Admitting: Gynecology

## 2019-09-21 ENCOUNTER — Other Ambulatory Visit: Payer: Self-pay

## 2019-09-21 ENCOUNTER — Ambulatory Visit: Payer: Medicare Other | Admitting: Internal Medicine

## 2019-09-21 ENCOUNTER — Encounter: Payer: Self-pay | Admitting: Internal Medicine

## 2019-09-21 VITALS — BP 122/76 | HR 64 | Temp 97.9°F | Resp 16 | Ht 64.0 in | Wt 152.6 lb

## 2019-09-21 DIAGNOSIS — R7309 Other abnormal glucose: Secondary | ICD-10-CM

## 2019-09-21 DIAGNOSIS — G250 Essential tremor: Secondary | ICD-10-CM

## 2019-09-21 DIAGNOSIS — E782 Mixed hyperlipidemia: Secondary | ICD-10-CM | POA: Diagnosis not present

## 2019-09-21 DIAGNOSIS — I10 Essential (primary) hypertension: Secondary | ICD-10-CM | POA: Diagnosis not present

## 2019-09-21 DIAGNOSIS — N183 Chronic kidney disease, stage 3 unspecified: Secondary | ICD-10-CM

## 2019-09-21 DIAGNOSIS — E559 Vitamin D deficiency, unspecified: Secondary | ICD-10-CM

## 2019-09-21 DIAGNOSIS — R7303 Prediabetes: Secondary | ICD-10-CM

## 2019-09-21 DIAGNOSIS — Z79899 Other long term (current) drug therapy: Secondary | ICD-10-CM

## 2019-09-21 NOTE — Patient Instructions (Signed)

## 2019-09-21 NOTE — Progress Notes (Signed)
History of Present Illness:      This very nice 85 y.o. WWF presents for 3 month follow up with HTN, HLD, Pre-Diabetes, Familial / Essential Hereditary Tremor, and Vitamin D Deficiency.       Patient is treated for HTN & BP has been controlled at home. Today's BP is at goal - 122/76. Cardiolite stress test was negative in May 2019.   In Nov 2018, she had a CVA with expressive dysphasia which recovered. Patient has had no complaints of any cardiac type chest pain, palpitations, dyspnea / orthopnea / PND, dizziness, claudication, or dependent edema.        Hyperlipidemia is controlled with diet & meds. Patient denies myalgias or other med SE's. Last Lipids were at goal:  Lab Results  Component Value Date   CHOL 122 06/16/2019   HDL 46 (L) 06/16/2019   LDLCALC 57 06/16/2019   TRIG 106 06/16/2019   CHOLHDL 2.7 06/16/2019       Also, the patient has history of PreDiabetes (A1c 5.7% / 2016)  and has had no symptoms of reactive hypoglycemia, diabetic polys, paresthesias or visual blurring.  Last A1c was Normal & at goal:  Lab Results  Component Value Date   HGBA1C 5.5 06/16/2019       Further, the patient also has history of Vitamin D Deficiency and supplements vitamin D without any suspected side-effects. Last vitamin D was at goal:  Lab Results  Component Value Date   VD25OH 85 06/16/2019   Current Outpatient Medications on File Prior to Visit  Medication Sig  . aspirin 325 MG tablet Take 325 mg by mouth daily.  Marland Kitchen atorvastatin (LIPITOR) 10 MG tablet Take 1 tablet (10 mg total) by mouth daily.  . Calcium-Magnesium-Vitamin D 300-20-200 MG-MG-UNIT CHEW Chew 1 tablet daily by mouth.   . Cholecalciferol (VITAMIN D3) 5000 units TABS Take 2 tablets by mouth daily.   . clonazePAM (KLONOPIN) 0.5 MG tablet TAKE 1 TABLET BY MOUTH EVERY MORNING,1/2 TABLET AT NOON AND 1/2 AT NIGHT  . Magnesium 250 MG TABS Take 250 mg by mouth 2 (two) times a day.  . propranolol (INDERAL) 40 MG tablet TAKE  1 TABLET BY MOUTH FOUR TIMES DAILY WITH MEALS AND BEDTIME FOR BLOOD PRESSURE AND TREMOR  . vitamin B-12 (CYANOCOBALAMIN) 500 MCG tablet Take 500 mcg by mouth daily. Sublingual   No current facility-administered medications on file prior to visit.    Allergies  Allergen Reactions  . Codeine Hives  . Meloxicam Nausea Only    Gastritis and nausea  . Mysoline [Primidone]   . Pneumovax 23 [Pneumococcal Vac Polyvalent]     Red, raised area.  Knot at site of injection  . Toradol [Ketorolac Tromethamine] Swelling    Localized redness at injection site.     PMHx:   Past Medical History:  Diagnosis Date  . C. difficile colitis   . Cancer [facility])    Endometrial cancer  . Essential and other specified forms of tremor   . Osteoporosis 11/2017   T score -2.7 stable from prior DEXA 2016  . Personal history of venous thrombosis and embolism 1997  . Stroke ([place])   . Thrush   . TIA (transient ischemic attack)    Immunization History  Administered Date(s) Administered  . H1N1 01/03/2009  . Influenza Whole 09/23/2013  . Influenza, High Dose Seasonal PF 09/06/2014, 08/23/2015, 08/21/2016, 08/28/2017, 09/01/2018, 09/14/2019  . PPD Test 03/29/2014  . Pneumococcal Polysaccharide-23 03/29/2014  .  Pneumococcal-Unspecified 12/09/2002  . Td 03/23/2013  . Zoster 12/09/2006   Past Surgical History:  Procedure Laterality Date  . APPENDECTOMY    . [place]  . TONSILLECTOMY AND ADENOIDECTOMY    . TOTAL ABDOMINAL HYSTERECTOMY W/ BILATERAL SALPINGOOPHORECTOMY  1997   FHx:    Reviewed / unchanged  SHx:    Reviewed / unchanged   Systems Review:  Constitutional: Denies fever, chills, wt changes, headaches, insomnia, fatigue, night sweats, change in appetite. Eyes: Denies redness, blurred vision, diplopia, discharge, itchy, watery eyes.  ENT: Denies discharge, congestion, post nasal drip, epistaxis, sore throat, earache, hearing loss, dental pain, tinnitus, vertigo, sinus pain, snoring.   CV: Denies chest pain, palpitations, irregular heartbeat, syncope, dyspnea, diaphoresis, orthopnea, PND, claudication or edema. Respiratory: denies cough, dyspnea, DOE, pleurisy, hoarseness, laryngitis, wheezing.  Gastrointestinal: Denies dysphagia, odynophagia, heartburn, reflux, water brash, abdominal pain or cramps, nausea, vomiting, bloating, diarrhea, constipation, hematemesis, melena, hematochezia  or hemorrhoids. Genitourinary: Denies dysuria, frequency, urgency, nocturia, hesitancy, discharge, hematuria or flank pain. Musculoskeletal: Denies arthralgias, myalgias, stiffness, jt. swelling, pain, limping or strain/sprain.  Skin: Denies pruritus, rash, hives, warts, acne, eczema or change in skin lesion(s). Neuro: No weakness, tremor, incoordination, spasms, paresthesia or pain. Psychiatric: Denies confusion, memory loss or sensory loss. Endo: Denies change in weight, skin or hair change.  Heme/Lymph: No excessive bleeding, bruising or enlarged lymph nodes.  Physical Exam  BP 122/76   Pulse 64   Temp 97.9 F (36.6 C)   Resp 16   Ht 5\' 4"  (1.626 m)   Wt 152 lb 9.6 oz (69.2 kg)   BMI 26.19 kg/m   Appears  well nourished, well groomed  and in no distress.  Eyes: PERRLA, EOMs, conjunctiva no swelling or erythema. Sinuses: No frontal/maxillary tenderness ENT/Mouth: EAC's clear, TM's nl w/o erythema, bulging. Nares clear w/o erythema, swelling, exudates. Oropharynx clear without erythema or exudates. Oral hygiene is good. Tongue normal, non obstructing. Hearing intact.  Neck: Supple. Thyroid not palpable. Car 2+/2+ without bruits, nodes or JVD. Chest: Respirations nl with BS clear & equal w/o rales, rhonchi, wheezing or stridor.  Cor: Heart sounds normal w/ regular rate and rhythm without sig. murmurs, gallops, clicks or rubs. Peripheral pulses normal and equal  without edema.  Abdomen: Soft & bowel sounds normal. Non-tender w/o guarding, rebound, hernias, masses or organomegaly.   Lymphatics: Unremarkable.  Musculoskeletal: Full ROM all peripheral extremities, joint stability, 5/5 strength and normal gait.  Skin: Warm, dry without exposed rashes, lesions or ecchymosis apparent.  Neuro: Cranial nerves intact, reflexes equal bilaterally. Sensory-motor testing grossly intact. Tendon reflexes grossly intact. Slight hi freq / low amplitude tremor of fingers. Pysch: Alert & oriented x 3.  Insight and judgement nl & appropriate. No ideations.  Assessment and Plan:  1. Essential hypertension  - Continue medication, monitor blood pressure at home.  - Continue DASH diet.  Reminder to go to the ER if any CP,  SOB, nausea, dizziness, severe HA, changes vision/speech.  - CBC with Differential/Platelet - COMPLETE METABOLIC PANEL WITH GFR - Magnesium - TSH  2. Hyperlipidemia, mixed  - Continue diet/meds, exercise,& lifestyle modifications.  - Continue monitor periodic cholesterol/liver & renal functions   - Lipid panel - TSH  3. Abnormal glucose  - Hemoglobin A1c - Insulin, random  4. Vitamin D deficiency  - Continue diet, exercise  - Lifestyle modifications.  - Monitor appropriate labs.  - Continue supplementation.  - VITAMIN D 25 Hydroxyl  5. Prediabetes  - Hemoglobin A1c -  Insulin, random  6. Essential tremor  7. Stage 3 chronic kidney disease,CKD 3  - COMPLETE METABOLIC PANEL WITH GFR  8. Medication management  - CBC with Differential/Platelet - COMPLETE METABOLIC PANEL WITH GFR - Magnesium - Lipid panel - TSH - Hemoglobin A1c - Insulin, random - VITAMIN D 25 Hydroxyl        Discussed  regular exercise, BP monitoring, weight control to achieve/maintain BMI less than 25 and discussed med and SE's. Recommended labs to assess and monitor clinical status with further disposition pending results of labs.  I discussed the assessment and treatment plan with the patient. The patient was provided an opportunity to ask questions and all were  answered. The patient agreed with the plan and demonstrated an understanding of the instructions.  I provided over 30 minutes of exam, counseling, chart review and  complex critical decision making.  Kirtland Bouchard, MD

## 2019-09-22 LAB — CBC WITH DIFFERENTIAL/PLATELET
Absolute Monocytes: 484 cells/uL (ref 200–950)
Basophils Absolute: 59 cells/uL (ref 0–200)
Basophils Relative: 1 %
Eosinophils Absolute: 254 cells/uL (ref 15–500)
Eosinophils Relative: 4.3 %
HCT: 43.7 % (ref 35.0–45.0)
Hemoglobin: 14.2 g/dL (ref 11.7–15.5)
Lymphs Abs: 2531 cells/uL (ref 850–3900)
MCH: 28.1 pg (ref 27.0–33.0)
MCHC: 32.5 g/dL (ref 32.0–36.0)
MCV: 86.5 fL (ref 80.0–100.0)
MPV: 10.3 fL (ref 7.5–12.5)
Monocytes Relative: 8.2 %
Neutro Abs: 2572 cells/uL (ref 1500–7800)
Neutrophils Relative %: 43.6 %
Platelets: 262 10*3/uL (ref 140–400)
RBC: 5.05 10*6/uL (ref 3.80–5.10)
RDW: 14.1 % (ref 11.0–15.0)
Total Lymphocyte: 42.9 %
WBC: 5.9 10*3/uL (ref 3.8–10.8)

## 2019-09-22 LAB — COMPLETE METABOLIC PANEL WITH GFR
AG Ratio: 1.9 (calc) (ref 1.0–2.5)
ALT: 13 U/L (ref 6–29)
AST: 18 U/L (ref 10–35)
Albumin: 4.2 g/dL (ref 3.6–5.1)
Alkaline phosphatase (APISO): 36 U/L — ABNORMAL LOW (ref 37–153)
BUN/Creatinine Ratio: 18 (calc) (ref 6–22)
BUN: 22 mg/dL (ref 7–25)
CO2: 26 mmol/L (ref 20–32)
Calcium: 9.7 mg/dL (ref 8.6–10.4)
Chloride: 102 mmol/L (ref 98–110)
Creat: 1.19 mg/dL — ABNORMAL HIGH (ref 0.60–0.88)
GFR, Est African American: 48 mL/min/{1.73_m2} — ABNORMAL LOW (ref >=60)
GFR, Est Non African American: 41 mL/min/{1.73_m2} — ABNORMAL LOW (ref >=60)
Globulin: 2.2 g/dL (calc) (ref 1.9–3.7)
Glucose, Bld: 96 mg/dL (ref 65–99)
Potassium: 4.8 mmol/L (ref 3.5–5.3)
Sodium: 136 mmol/L (ref 135–146)
Total Bilirubin: 0.6 mg/dL (ref 0.2–1.2)
Total Protein: 6.4 g/dL (ref 6.1–8.1)

## 2019-09-22 LAB — MAGNESIUM: Magnesium: 1.9 mg/dL (ref 1.5–2.5)

## 2019-09-22 LAB — LIPID PANEL
Cholesterol: 127 mg/dL (ref <200)
HDL: 46 mg/dL — ABNORMAL LOW (ref >=50)
LDL Cholesterol (Calc): 59 mg/dL (calc)
Non-HDL Cholesterol (Calc): 81 mg/dL (calc) (ref <130)
Total CHOL/HDL Ratio: 2.8 (calc) (ref <5.0)
Triglycerides: 139 mg/dL (ref <150)

## 2019-09-22 LAB — HEMOGLOBIN A1C
Hgb A1c MFr Bld: 5.4 % of total Hgb (ref <5.7)
Mean Plasma Glucose: 108 (calc)
eAG (mmol/L): 6 (calc)

## 2019-09-22 LAB — TSH: TSH: 2.43 mIU/L (ref 0.40–4.50)

## 2019-09-22 LAB — INSULIN, RANDOM: Insulin: 11.7 u[IU]/mL

## 2019-09-22 LAB — VITAMIN D 25 HYDROXY (VIT D DEFICIENCY, FRACTURES): Vit D, 25-Hydroxy: 80 ng/mL (ref 30–100)

## 2019-09-23 ENCOUNTER — Encounter: Payer: Self-pay | Admitting: *Deleted

## 2019-10-20 DIAGNOSIS — I471 Supraventricular tachycardia, unspecified: Secondary | ICD-10-CM | POA: Insufficient documentation

## 2019-10-20 DIAGNOSIS — I425 Other restrictive cardiomyopathy: Secondary | ICD-10-CM | POA: Insufficient documentation

## 2019-10-21 ENCOUNTER — Ambulatory Visit (INDEPENDENT_AMBULATORY_CARE_PROVIDER_SITE_OTHER): Payer: Medicare Other | Admitting: Adult Health Nurse Practitioner

## 2019-10-21 ENCOUNTER — Other Ambulatory Visit: Payer: Self-pay

## 2019-10-21 ENCOUNTER — Encounter: Payer: Self-pay | Admitting: Adult Health Nurse Practitioner

## 2019-10-21 VITALS — BP 120/68 | HR 66 | Temp 97.2°F | Wt 152.0 lb

## 2019-10-21 DIAGNOSIS — I425 Other restrictive cardiomyopathy: Secondary | ICD-10-CM

## 2019-10-21 DIAGNOSIS — N183 Chronic kidney disease, stage 3 unspecified: Secondary | ICD-10-CM | POA: Diagnosis not present

## 2019-10-21 DIAGNOSIS — I471 Supraventricular tachycardia, unspecified: Secondary | ICD-10-CM

## 2019-10-21 DIAGNOSIS — G250 Essential tremor: Secondary | ICD-10-CM

## 2019-10-21 NOTE — Progress Notes (Signed)
Assessment and Plan:  Monica Curry was seen today for follow-up.  Diagnoses and all orders for this visit:  Stage 3 chronic kidney disease, unspecified whether stage 3a or 3b CKD Increase fluids  Avoid NSAIDS Blood pressure control Monitor sugars  Will continue to monitor -Will check CMP  Paroxysmal supraventricular tachycardia (HCC)  Primary restrictive cardiomyopathy ([place]) Rate controlled Follows with cardiology Q39months  Essential tremor Doing well on propanolol at this time Follows with Neurology    Further disposition pending results of labs. Discussed med's effects and SE's.   Over 20 minutes of interview exam, counseling, chart review, and critical decision making was performed.   Future Appointments  Date Time Provider [place]  11/08/2019 10:30 AM Fontaine, Belinda Block, MD [facility] [name]  12/27/2019  2:30 PM [name], NP [facility] None  01/27/2020  9:30 AM [name], NP [facility] None  03/27/2020  2:00 PM Vicie Mutters, PA-C [facility] None  06/29/2020  2:00 PM [name], NP [facility] None    ------------------------------------------------------------------------------------------------------------------   HPI 85 y.o.female presents for follow up on CKD.  Patient was last seen on 09/21/19 and GFR 41, CRE 1.19, decreased from previous visit three months prior GFR 45. Family history of kidney failure, brother and sister.  She follows with Dr Ned Clines, [facility], for her tremors.  Reports they do not bother her as much as she is at home.  She reports she is mainly troubled by this when she is out in social or situation in the public.  She has and evaluation for DBS but decided this option is not for her at this time.  She has tried a weight glove to see if this  Helped and she reports is was more of a hinderance and did not improve her witting.  She is taking propanolol 40mg  four times a day and doing well with this.  Denies any excessive fatigue or  sleepiness in fact she is quite active for her age.  She has history of paroxymal SVT and restrictive cardiomyopathy, propanolol also helping with rate control.  She follows with cardiology every 6 months.  She denies any chest pains, shortness of breath orthopnea, palpitations or edema to her extremities.    Past Medical History:  Diagnosis Date  . C. difficile colitis   . Cancer [facility])    Endometrial cancer  . Essential and other specified forms of tremor   . Osteoporosis 11/2017   T score -2.7 stable from prior DEXA 2016  . Personal history of venous thrombosis and embolism 1997  . Stroke ([place])   . Thrush   . TIA (transient ischemic attack)      Allergies  Allergen Reactions  . Codeine Hives  . Meloxicam Nausea Only    Gastritis and nausea  . Mysoline [Primidone]   . Pneumovax 23 [Pneumococcal Vac Polyvalent]     Red, raised area.  Knot at site of injection  . Toradol [Ketorolac Tromethamine] Swelling    Localized redness at injection site.      Current Outpatient Medications on File Prior to Visit  Medication Sig  . aspirin 325 MG tablet Take 325 mg by mouth daily.  Marland Kitchen atorvastatin (LIPITOR) 10 MG tablet Take 1 tablet (10 mg total) by mouth daily.  . Calcium-Magnesium-Vitamin D 300-20-200 MG-MG-UNIT CHEW Chew 1 tablet daily by mouth.   . Cholecalciferol (VITAMIN D3) 5000 units TABS Take 2 tablets by mouth daily.   . clonazePAM (KLONOPIN) 0.5 MG tablet TAKE 1 TABLET BY MOUTH EVERY MORNING,1/2 TABLET AT  NOON AND 1/2 AT NIGHT  . Magnesium 250 MG TABS Take 250 mg by mouth 2 (two) times a day.  . propranolol (INDERAL) 40 MG tablet TAKE 1 TABLET BY MOUTH FOUR TIMES DAILY WITH MEALS AND BEDTIME FOR BLOOD PRESSURE AND TREMOR  . vitamin B-12 (CYANOCOBALAMIN) 500 MCG tablet Take 500 mcg by mouth daily. Sublingual   No current facility-administered medications on file prior to visit.     ROS: all negative except above.     Physical Exam:  BP 120/68   Pulse 66   Temp (!)  97.2 F (36.2 C)   Wt 152 lb (68.9 kg)   SpO2 97%   BMI 26.09 kg/m   General Appearance: Well nourished, in no apparent distress. Eyes: PERRLA, EOMs, conjunctiva no swelling or erythema Sinuses: No Frontal/maxillary tenderness Respiratory: Respiratory effort normal, BS equal bilaterally without rales, rhonchi, wheezing or stridor.  Cardio: RRR with no MRGs. Brisk peripheral pulses without edema.  Musculoskeletal: Full ROM, 5/5 strength, normal gait.  Skin: Warm, dry without rashes, lesions, ecchymosis.  Neuro: Cranial nerves intact. Normal muscle tone, no cerebellar symptoms. Sensation intact.  Psych: Awake and oriented X 3, normal affect, Insight and Judgment appropriate.     Garnet Sierras, NP 12:51 PM [facility]

## 2019-10-21 NOTE — Patient Instructions (Signed)
We will call you with lab results in 1-3 days.    Chronic Kidney Disease, Adult Chronic kidney disease (CKD) happens when the kidneys are damaged over a long period of time. The kidneys are two organs that help with:  Getting rid of waste and extra fluid from the blood.  Making hormones that maintain the amount of fluid in your tissues and blood vessels.  Making sure that the body has the right amount of fluids and chemicals. Most of the time, CKD does not go away, but it can usually be controlled. Steps must be taken to slow down the kidney damage or to stop it from getting worse. If this is not done, the kidneys may stop working. Follow these instructions at home: Medicines  Take over-the-counter and prescription medicines only as told by your doctor. You may need to change the amount of medicines you take.  Do not take any new medicines unless your doctor says it is okay. Many medicines can make your kidney damage worse.  Do not take any vitamin and supplements unless your doctor says it is okay. Many vitamins and supplements can make your kidney damage worse. General instructions  Follow a diet as told by your doctor. You may need to stay away from: ? Alcohol. ? Salty foods. ? Foods that are high in:  Potassium.  Calcium.  Protein.  Do not use any products that contain nicotine or tobacco, such as cigarettes and e-cigarettes. If you need help quitting, ask your doctor.  Keep track of your blood pressure at home. Tell your doctor about any changes.  If you have diabetes, keep track of your blood sugar as told by your doctor.  Try to stay at a healthy weight. If you need help, ask your doctor.  Exercise at least 30 minutes a day, 5 days a week.  Stay up-to-date with your shots (immunizations) as told by your doctor.  Keep all follow-up visits as told by your doctor. This is important. Contact a doctor if:  Your symptoms get worse.  You have new symptoms. Get help  right away if:  You have symptoms of end-stage kidney disease. These may include: ? Headaches. ? Numbness in your hands or feet. ? Easy bruising. ? Having hiccups often. ? Chest pain. ? Shortness of breath. ? Stopping of menstrual periods in women.  You have a fever.  You have very little pee (urine).  You have pain or bleeding when you pee. Summary  Chronic kidney disease (CKD) happens when the kidneys are damaged over a long period of time.  Most of the time, this condition does not go away, but it can usually be controlled. Steps must be taken to slow down the kidney damage or to stop it from getting worse.  Treatment may include a combination of medicines and lifestyle changes. This information is not intended to replace advice given to you by your health care provider. Make sure you discuss any questions you have with your health care provider. Document Released: 02/19/2010 Document Revised: 11/07/2017 Document Reviewed: 12/30/2016 Elsevier Patient Education  2020 [employer].

## 2019-10-22 LAB — COMPLETE METABOLIC PANEL WITH GFR
AG Ratio: 1.9 (calc) (ref 1.0–2.5)
ALT: 11 U/L (ref 6–29)
AST: 16 U/L (ref 10–35)
Albumin: 4.2 g/dL (ref 3.6–5.1)
Alkaline phosphatase (APISO): 37 U/L (ref 37–153)
BUN/Creatinine Ratio: 14 (calc) (ref 6–22)
BUN: 18 mg/dL (ref 7–25)
CO2: 31 mmol/L (ref 20–32)
Calcium: 10 mg/dL (ref 8.6–10.4)
Chloride: 103 mmol/L (ref 98–110)
Creat: 1.33 mg/dL — ABNORMAL HIGH (ref 0.60–0.88)
GFR, Est African American: 42 mL/min/{1.73_m2} — ABNORMAL LOW (ref >=60)
GFR, Est Non African American: 36 mL/min/{1.73_m2} — ABNORMAL LOW (ref >=60)
Globulin: 2.2 g/dL (calc) (ref 1.9–3.7)
Glucose, Bld: 102 mg/dL — ABNORMAL HIGH (ref 65–99)
Potassium: 5.2 mmol/L (ref 3.5–5.3)
Sodium: 139 mmol/L (ref 135–146)
Total Bilirubin: 0.7 mg/dL (ref 0.2–1.2)
Total Protein: 6.4 g/dL (ref 6.1–8.1)

## 2019-10-25 ENCOUNTER — Other Ambulatory Visit: Payer: Self-pay | Admitting: Internal Medicine

## 2019-10-26 ENCOUNTER — Telehealth: Payer: Self-pay | Admitting: *Deleted

## 2019-10-26 DIAGNOSIS — N183 Chronic kidney disease, stage 3 unspecified: Secondary | ICD-10-CM

## 2019-10-26 NOTE — Telephone Encounter (Signed)
Per Dr Melford Aase, the patient was called and asked to schedule a NV and lab to recheck her Kidney functions.  The patient was agreeable and an appointment was scheduled for 11/11/2019 NV and BMP with GFR.

## 2019-11-03 ENCOUNTER — Other Ambulatory Visit: Payer: Self-pay

## 2019-11-08 ENCOUNTER — Encounter: Payer: Self-pay | Admitting: Gynecology

## 2019-11-08 ENCOUNTER — Other Ambulatory Visit: Payer: Self-pay

## 2019-11-08 ENCOUNTER — Ambulatory Visit: Payer: Medicare Other | Admitting: Gynecology

## 2019-11-08 VITALS — BP 118/76 | Ht 63.0 in | Wt 152.0 lb

## 2019-11-08 DIAGNOSIS — Z01419 Encounter for gynecological examination (general) (routine) without abnormal findings: Secondary | ICD-10-CM | POA: Diagnosis not present

## 2019-11-08 DIAGNOSIS — M810 Age-related osteoporosis without current pathological fracture: Secondary | ICD-10-CM

## 2019-11-08 DIAGNOSIS — N952 Postmenopausal atrophic vaginitis: Secondary | ICD-10-CM | POA: Diagnosis not present

## 2019-11-08 DIAGNOSIS — N368 Other specified disorders of urethra: Secondary | ICD-10-CM

## 2019-11-08 DIAGNOSIS — Z8542 Personal history of malignant neoplasm of other parts of uterus: Secondary | ICD-10-CM

## 2019-11-08 NOTE — Progress Notes (Signed)
    Monica Curry [id number]        85 y.o.  G2P2001 for annual gynecologic exam.  Without gynecologic complaints  Past medical history,surgical history, problem list, medications, allergies, family history and social history were all reviewed and documented as reviewed in the EPIC chart.  ROS:  Performed with pertinent positives and negatives included in the history, assessment and plan.   Additional significant findings : None   Exam: Monica Curry assistant Vitals:   11/08/19 1038  BP: 118/76  Weight: 152 lb (68.9 kg)  Height: 5\' 3"  (1.6 m)   Body mass index is 26.93 kg/m.  General appearance:  Normal affect, orientation and appearance. Skin: Grossly normal HEENT: Without gross lesions.  No cervical or supraclavicular adenopathy. Thyroid normal.  Lungs:  Clear without wheezing, rales or rhonchi Cardiac: RR, without RMG Abdominal:  Soft, nontender, without masses, guarding, rebound, organomegaly or hernia Breasts:  Examined lying and sitting without masses, retractions, discharge or axillary adenopathy. Pelvic:  Ext, BUS, Vagina: With atrophic changes.  Mild urethral prolapse noted  Adnexa: Without masses or tenderness    Anus and perineum: Normal   Rectovaginal: Normal sphincter tone without palpated masses or tenderness.    Assessment/Plan:  85 y.o. G65P2001 female for annual gynecologic exam.   1. Postmenopausal.  No significant menopausal symptoms. 2. History of endometrial adenocarcinoma, well differentiated.  Status post TAH/BSO.  Primarily in situ with focus of minimal myometrial invasion (2 mm) 1997.  Pap smear 2018.  No Pap smear done today.  We discussed current gynecologic oncology recommendations to stop Pap smears and she is comfortable with this. 3. Mammography coming due end of December.  Breast exam normal today. 4. Osteoporosis.  DEXA 2018 T score -2.7 stable from prior DEXA.  History of bisphosphate use for over 10 years on drug-free holiday.   Schedule follow-up DEXA now at 2-year interval. 5. Colonoscopy 2012.  Repeat at their recommended interval.  Colon screening per primary provider's recommendations. 6. Health maintenance.  No routine lab work done as patient does this elsewhere.  Follow-up 1 year, sooner as needed. 7.    Anastasio Auerbach MD, 11:15 AM 11/08/2019

## 2019-11-08 NOTE — Patient Instructions (Signed)
Follow-up for the bone density as scheduled. 

## 2019-11-11 ENCOUNTER — Ambulatory Visit: Payer: Medicare Other

## 2019-11-11 ENCOUNTER — Other Ambulatory Visit: Payer: Self-pay

## 2019-11-11 ENCOUNTER — Ambulatory Visit: Payer: Medicare Other | Admitting: Adult Health Nurse Practitioner

## 2019-11-11 DIAGNOSIS — N183 Chronic kidney disease, stage 3 unspecified: Secondary | ICD-10-CM | POA: Diagnosis not present

## 2019-11-11 NOTE — Progress Notes (Signed)
Patient presents to the office for a nurse visit to have labs done. No changes with medications. No questions or concerns. Vitals taken and recorded.

## 2019-11-12 LAB — BASIC METABOLIC PANEL WITH GFR
BUN/Creatinine Ratio: 16 (calc) (ref 6–22)
BUN: 21 mg/dL (ref 7–25)
CO2: 27 mmol/L (ref 20–32)
Calcium: 9.4 mg/dL (ref 8.6–10.4)
Chloride: 101 mmol/L (ref 98–110)
Creat: 1.32 mg/dL — ABNORMAL HIGH (ref 0.60–0.88)
GFR, Est African American: 42 mL/min/{1.73_m2} — ABNORMAL LOW (ref >=60)
GFR, Est Non African American: 36 mL/min/{1.73_m2} — ABNORMAL LOW (ref >=60)
Glucose, Bld: 95 mg/dL (ref 65–99)
Potassium: 4.8 mmol/L (ref 3.5–5.3)
Sodium: 136 mmol/L (ref 135–146)

## 2019-11-15 NOTE — Progress Notes (Signed)
11/15/2019--PATIENT IS AWARE OF LAB RESULTS. [facility]

## 2019-11-22 ENCOUNTER — Other Ambulatory Visit: Payer: Self-pay

## 2019-11-23 ENCOUNTER — Ambulatory Visit (INDEPENDENT_AMBULATORY_CARE_PROVIDER_SITE_OTHER): Payer: Medicare Other

## 2019-11-23 ENCOUNTER — Other Ambulatory Visit: Payer: Self-pay

## 2019-11-23 ENCOUNTER — Other Ambulatory Visit: Payer: Self-pay | Admitting: Gynecology

## 2019-11-23 DIAGNOSIS — Z780 Asymptomatic menopausal state: Secondary | ICD-10-CM | POA: Diagnosis not present

## 2019-11-23 DIAGNOSIS — M810 Age-related osteoporosis without current pathological fracture: Secondary | ICD-10-CM

## 2019-11-24 ENCOUNTER — Encounter: Payer: Self-pay | Admitting: Gynecology

## 2019-11-28 ENCOUNTER — Other Ambulatory Visit: Payer: Self-pay | Admitting: Internal Medicine

## 2019-11-28 DIAGNOSIS — E782 Mixed hyperlipidemia: Secondary | ICD-10-CM

## 2019-11-28 MED ORDER — ATORVASTATIN CALCIUM 10 MG PO TABS: ORAL_TABLET | ORAL | 1 refills | Status: DC

## 2019-11-30 ENCOUNTER — Encounter: Payer: Self-pay | Admitting: Gynecology

## 2019-12-10 HISTORY — PX: OTHER SURGICAL HISTORY: SHX169

## 2019-12-14 ENCOUNTER — Other Ambulatory Visit: Payer: Self-pay | Admitting: *Deleted

## 2019-12-14 DIAGNOSIS — G250 Essential tremor: Secondary | ICD-10-CM

## 2019-12-14 MED ORDER — CLONAZEPAM 0.5 MG PO TABS: ORAL_TABLET | ORAL | 3 refills | Status: DC

## 2019-12-23 NOTE — Progress Notes (Signed)
FOLLOW UP  Assessment and Plan:   Diagnoses and all orders for this visit:  Essential hypertension Continue medication Monitor blood pressure at home; call if consistently over 130/80 Continue DASH diet.   Reminder to go to the ER if any CP, SOB, nausea, dizziness, severe HA, changes vision/speech, left arm numbness and tingling and jaw pain.  Essential tremor Doing well on propanolol at this time Follows with Neurology  Stage 3 chronic kidney disease, 3b CKD Increase fluids, avoid NSAIDS, monitor sugars, will monitor CMP/GFR  No high risk medications; refer to nephrology if trending down below GFR 30  Vitamin D deficiency At goal at recent check; continue to recommend supplementation for goal of 60-100 Defer vitamin D level  Other abnormal glucose Recent A1Cs at goal Discussed diet/exercise, weight management  Defer A1C; check CMP  Mixed hyperlipidemia Continue medications Continue low cholesterol diet and exercise.  Check lipid panel.   Medication management CBC, CMP/GFR, magnesium   Paroxysmal supraventricular tachycardia (HCC) Controlled on propranolol; follows cardiology    Continue diet and meds as discussed. Further disposition pending results of labs. Discussed med's effects and SE's.   Over 30 minutes of exam, counseling, chart review, and critical decision making was performed.   Future Appointments  Date Time Provider [place]  12/29/2019  8:45 AM [place] [facility]  01/03/2020 11:00 AM Fay Records, MD [facility]  01/27/2020  9:30 AM [name], NP [facility] None  03/27/2020  2:00 PM Vicie Mutters, PA-C [facility] None  06/29/2020  2:00 PM [name], NP [facility] None    ----------------------------------------------------------------------------------------------------------------------  HPI 86 y.o. female  presents for 3 month follow up on hypertension, cholesterol, glucose, weight and  vitamin D deficiency.   The patient was admitted in 10/2017 for a L Brain TIA w/ transient expressive aphasia. MRI, MRA, 2D echo, Carotid Dopplers, were Normal. Patient was released on LD bASA and continues on statin. She is followed by Neurology. Event monitor did show SVT;   Takes propranolol 40 mg QID daily and Clonazepam 0.5 mg PRN for tremors.  BMI is Body mass index is 27.28 kg/m., she has been working on diet, watches portions, pushing salads, active around home and yard but not intentionally exercising.  Wt Readings from Last 3 Encounters:  12/27/19 154 lb (69.9 kg)  11/11/19 154 lb 6.4 oz (70 kg)  11/08/19 152 lb (68.9 kg)   Her blood pressure has been controlled at home, today [name] is BP: 112/70  She does not workout. She denies chest pain, shortness of breath, dizziness.   She is on cholesterol medication Atorvastatin and denies myalgias. Her cholesterol is at goal. The cholesterol last visit was:   Lab Results  Component Value Date   CHOL 127 09/21/2019   HDL 46 (L) 09/21/2019   LDLCALC 59 09/21/2019   TRIG 139 09/21/2019   CHOLHDL 2.8 09/21/2019    She has been working on diet and exercise for glucose management, and denies increased appetite, nausea, paresthesia of the feet, polydipsia, polyuria and visual disturbances. Last A1C in the office was:  Lab Results  Component Value Date   HGBA1C 5.4 09/21/2019   [name] monitored at this office:  Lab Results  Component Value Date   GFRNONAA 36 (L) 11/11/2019   GFRNONAA 36 (L) 10/21/2019   GFRNONAA 41 (L) 09/21/2019  Drinking 96 fluid ounces of water daily; denies NSAIDs; CKD has been stable in GFR 30-40s since 2018;   Patient is  on Vitamin D supplement.   Lab Results  Component Value Date   VD25OH 80 09/21/2019       Current Medications:  Current Outpatient Medications on File Prior to Visit  Medication Sig  . aspirin 325 MG tablet Take 325 mg by mouth daily.  Marland Kitchen atorvastatin (LIPITOR) 10 MG  tablet Take 1 tablet Daily for Cholesterol  . Calcium-Magnesium-Vitamin D 300-20-200 MG-MG-UNIT CHEW Chew 1 tablet daily by mouth.   . Cholecalciferol (VITAMIN D3) 5000 units TABS Take 2 tablets by mouth daily.   . clonazePAM (KLONOPIN) 0.5 MG tablet TAKE 1 TABLET BY MOUTH EVERY MORNING,1/2 TABLET AT NOON AND 1/2 AT NIGHT  . Magnesium 250 MG TABS Take 250 mg by mouth 2 (two) times a day.  . propranolol (INDERAL) 40 MG tablet TAKE 1 TABLET BY MOUTH FOUR TIMES DAILY WITH MEALS AND BEDTIME FOR BLOOD PRESSURE AND TREMOR  . vitamin B-12 (CYANOCOBALAMIN) 500 MCG tablet Take 500 mcg by mouth daily. Sublingual   No current facility-administered medications on file prior to visit.     Allergies:  Allergies  Allergen Reactions  . Codeine Hives  . Meloxicam Nausea Only    Gastritis and nausea  . Mysoline [Primidone]   . Pneumovax 23 [Pneumococcal Vac Polyvalent]     Red, raised area.  Knot at site of injection  . Toradol [Ketorolac Tromethamine] Swelling    Localized redness at injection site.       Medical History:  Past Medical History:  Diagnosis Date  . C. difficile colitis   . Cancer [facility])    Endometrial cancer  . Essential and other specified forms of tremor   . Osteoporosis 11/2019   T score -2.8 stable from prior DEXA  . Personal history of venous thrombosis and embolism 1997  . Shingles   . Stroke ([place])   . Thrush   . TIA (transient ischemic attack)    Family history- Reviewed and unchanged Social history- Reviewed and unchanged   Review of Systems:  Review of Systems  Constitutional: Negative for malaise/fatigue and weight loss.  HENT: Negative for hearing loss and tinnitus.   Eyes: Negative for blurred vision and double vision.  Respiratory: Negative for cough, shortness of breath and wheezing.   Cardiovascular: Negative for chest pain, palpitations, orthopnea, claudication and leg swelling.  Gastrointestinal: Negative for abdominal pain, blood in stool,  constipation, diarrhea, heartburn, melena, nausea and vomiting.  Genitourinary: Negative.   Musculoskeletal: Negative for joint pain and myalgias.  Skin: Negative for rash.  Neurological: Positive for tremors. Negative for dizziness, tingling, sensory change, weakness and headaches.  Endo/Heme/Allergies: Negative for polydipsia.  Psychiatric/Behavioral: Negative.   All other systems reviewed and are negative.     Physical Exam: BP 112/70   Pulse 60   Temp (!) 96.4 F (35.8 C)   Wt 154 lb (69.9 kg)   SpO2 99%   BMI 27.28 kg/m  Wt Readings from Last 3 Encounters:  12/27/19 154 lb (69.9 kg)  11/11/19 154 lb 6.4 oz (70 kg)  11/08/19 152 lb (68.9 kg)   General Appearance: Well nourished, in no apparent distress. Eyes: PERRLA, EOMs, conjunctiva no swelling or erythema Sinuses: No Frontal/maxillary tenderness ENT/Mouth: Ext aud canals clear, TMs without erythema, bulging. No erythema, swelling, or exudate on post pharynx.  Tonsils not swollen or erythematous. Hearing normal.  Neck: Supple, thyroid normal.  Respiratory: Respiratory effort normal, BS equal bilaterally without rales, rhonchi, wheezing or stridor.  Cardio: RRR with no MRGs. Brisk peripheral pulses without  edema.  Abdomen: Soft, + BS.  Non tender, no guarding, rebound, hernias, masses. Lymphatics: Non tender without lymphadenopathy.  Musculoskeletal: Full ROM, 5/5 strength, Normal gait Skin: Warm, dry without rashes, lesions, ecchymosis.  Neuro: Cranial nerves intact. No cerebellar symptoms.  Psych: Awake and oriented X 3, normal affect, Insight and Judgment appropriate.    Izora Ribas, NP 2:52 PM [facility]

## 2019-12-27 ENCOUNTER — Encounter: Payer: Self-pay | Admitting: Adult Health

## 2019-12-27 ENCOUNTER — Other Ambulatory Visit: Payer: Self-pay

## 2019-12-27 ENCOUNTER — Ambulatory Visit (INDEPENDENT_AMBULATORY_CARE_PROVIDER_SITE_OTHER): Payer: Medicare PPO | Admitting: Adult Health

## 2019-12-27 VITALS — BP 112/70 | HR 60 | Temp 96.4°F | Wt 154.0 lb

## 2019-12-27 DIAGNOSIS — R7309 Other abnormal glucose: Secondary | ICD-10-CM | POA: Diagnosis not present

## 2019-12-27 DIAGNOSIS — I471 Supraventricular tachycardia: Secondary | ICD-10-CM | POA: Diagnosis not present

## 2019-12-27 DIAGNOSIS — Z79899 Other long term (current) drug therapy: Secondary | ICD-10-CM

## 2019-12-27 DIAGNOSIS — N183 Chronic kidney disease, stage 3 unspecified: Secondary | ICD-10-CM

## 2019-12-27 DIAGNOSIS — E559 Vitamin D deficiency, unspecified: Secondary | ICD-10-CM | POA: Diagnosis not present

## 2019-12-27 DIAGNOSIS — G250 Essential tremor: Secondary | ICD-10-CM

## 2019-12-27 DIAGNOSIS — I10 Essential (primary) hypertension: Secondary | ICD-10-CM

## 2019-12-27 DIAGNOSIS — E782 Mixed hyperlipidemia: Secondary | ICD-10-CM

## 2019-12-27 NOTE — Patient Instructions (Addendum)
Goals    . DIET - INCREASE WATER INTAKE     Aim for 65 fluid ounces of water or clear liquids daily     . Exercise 150 min/wk Moderate Activity    . LDL CALC < 70       Preventing Chronic Kidney Disease Chronic kidney disease (CKD) occurs when the kidneys are damaged for at least 3 months and do not function effectively. The kidneys are two organs that do many important jobs in the body, including:  Removing waste and extra fluid from the blood to make urine.  Making hormones that maintain the amount of fluid in tissues and blood vessels.  Maintaining the right amount of fluids and electrolytes in the body. A small amount of kidney damage may not cause problems, but a large amount of damage may make it hard or impossible for the kidneys to work the way they should. CKD gets worse over time (is progressive). You can take steps to prevent CKD or to keep it from getting worse. The best way to prevent kidney damage is to know your risk factors and make changes before you develop symptoms of CKD. How can this condition affect me? At first, you may not notice any signs or symptoms of CKD. Symptoms develop slowly and may not be obvious until the kidney damage becomes severe. If steps are not taken to prevent or slow down the disease, CKD can lead to:  A low red blood cell count (anemia).  Heart disease.  Weak bones.  Nerve damage (neuropathy).  Stroke.  Kidney failure and dialysis. What can increase my risk? You are more likely to develop CKD if you:  Are 58 years of age or older.  Are female.  Are of African American, Native American, Hispanic, Asian, or [place] descent.  Are obese.  Have taken certain medicines for a long time.  Use tobacco or have used it in the past.  Have any of the following conditions: ? Diabetes. ? High blood pressure. ? Heart disease. ? Multiple myeloma. ? An autoimmune disease. ? Frequent urinary tract infections. ? Polycystic  kidney disease.  Have a family history of kidney disease, heart disease, diabetes, or high blood pressure.  Have problems with urine flow that may be caused by: ? Cancer. ? Having kidney stones more than once. ? An enlarged prostate in males. What actions can I take to prevent CKD? Managing conditions that put you at risk  Talk to your health care provider about your kidney health and your risk factors for CKD.  Work with your health care provider to manage conditions such as high blood pressure or diabetes. This may involve taking medicines, eating healthy, or making lifestyle changes to: ? Get high blood pressure down to the target that your health care provider recommends. ? Get blood sugar (glucose) levels down to the target that your health care provider recommends. Eating and drinking   Follow instructions from your health care provider about diet. This may include: ? Limiting salt (sodium) intake. You should have less than 1 tsp (2,300 mg) of sodium a day. If you have heart disease or high blood pressure, you should have less than  tsp (1,500 mg) of sodium a day. ? Limiting protein intake as told by your health care provider. Avoid high-protein foods. ? Eating a balanced, heart-healthy diet. ? Avoiding foods that are high in potassium and phosphorous.  Limit alcohol. If you drink alcohol: ? Limit how much you use to:  0-1 drink a day for nonpregnant women.  0-2 drinks a day for men. ? Be aware of how much alcohol is in your drink. In the [place] one drink equals one 12 oz bottle of beer (355 mL), one 5 oz glass of wine (148 mL), or one 1 oz glass of hard liquor (44 mL).  If you have diabetes, work with a Financial planner (Firefighter) or a certified diabetes educator to develop a healthy eating plan.  Talk with your health care provider about how much fluid you should drink each day. Lifestyle   Exercise for at least 30 minutes on 5 or more days of the week,  or as much as told by your health care provider.  Keep your weight at a healthy level. If you are overweight or obese, lose weight as told by your health care provider.  Do not use any products that contain nicotine or tobacco, such as cigarettes, e-cigarettes, and chewing tobacco. If you need help quitting, ask your health care provider. General instructions  Take over-the-counter and prescription medicines only as told by your health care provider. Do not take any new medicines unless approved by your health care provider.  Use NSAIDs for pain only when necessary. Ask your health care provider about other pain medicines that do not increase your risk of developing CKD.  Have a yearly physical exam.  Learn about your family's medical history. Talk to your relatives and siblings about diabetes, heart disease, and high blood pressure. Where to find more information Learn more about CKD and how to prevent CKD from:  [place]: www.kidney.org  American Association of Kidney Patients: BombTimer.gl  American Diabetes Association: www.diabetes.org Summary  Symptoms of CKD develop slowly and may not be obvious until the kidney damage becomes severe.  The best way to prevent kidney damage is to know your risk factors and make nutrition and lifestyle changes before you develop symptoms of CKD.  Follow instructions from your health care provider about diet, which may include limiting how much salt, protein, and alcohol you consume.  Work with your health care provider to keep your blood pressure and blood sugar levels within the recommended range. This information is not intended to replace advice given to you by your health care provider. Make sure you discuss any questions you have with your health care provider. Document Revised: 03/19/2019 Document Reviewed: 12/30/2018 Elsevier Patient Education  [place].

## 2019-12-28 LAB — COMPLETE METABOLIC PANEL WITH GFR
AG Ratio: 2 (calc) (ref 1.0–2.5)
ALT: 19 U/L (ref 6–29)
AST: 25 U/L (ref 10–35)
Albumin: 4 g/dL (ref 3.6–5.1)
Alkaline phosphatase (APISO): 37 U/L (ref 37–153)
BUN/Creatinine Ratio: 17 (calc) (ref 6–22)
BUN: 18 mg/dL (ref 7–25)
CO2: 29 mmol/L (ref 20–32)
Calcium: 9.4 mg/dL (ref 8.6–10.4)
Chloride: 103 mmol/L (ref 98–110)
Creat: 1.04 mg/dL — ABNORMAL HIGH (ref 0.60–0.88)
GFR, Est African American: 56 mL/min/{1.73_m2} — ABNORMAL LOW (ref >=60)
GFR, Est Non African American: 49 mL/min/{1.73_m2} — ABNORMAL LOW (ref >=60)
Globulin: 2 g/dL (calc) (ref 1.9–3.7)
Glucose, Bld: 98 mg/dL (ref 65–99)
Potassium: 5.1 mmol/L (ref 3.5–5.3)
Sodium: 136 mmol/L (ref 135–146)
Total Bilirubin: 0.4 mg/dL (ref 0.2–1.2)
Total Protein: 6 g/dL — ABNORMAL LOW (ref 6.1–8.1)

## 2019-12-28 LAB — CBC WITH DIFFERENTIAL/PLATELET
Absolute Monocytes: 653 cells/uL (ref 200–950)
Basophils Absolute: 92 cells/uL (ref 0–200)
Basophils Relative: 1.4 %
Eosinophils Absolute: 231 cells/uL (ref 15–500)
Eosinophils Relative: 3.5 %
HCT: 44.2 % (ref 35.0–45.0)
Hemoglobin: 14.6 g/dL (ref 11.7–15.5)
Lymphs Abs: 2541 cells/uL (ref 850–3900)
MCH: 28.8 pg (ref 27.0–33.0)
MCHC: 33 g/dL (ref 32.0–36.0)
MCV: 87.2 fL (ref 80.0–100.0)
MPV: 10.4 fL (ref 7.5–12.5)
Monocytes Relative: 9.9 %
Neutro Abs: 3082 cells/uL (ref 1500–7800)
Neutrophils Relative %: 46.7 %
Platelets: 337 10*3/uL (ref 140–400)
RBC: 5.07 10*6/uL (ref 3.80–5.10)
RDW: 13.5 % (ref 11.0–15.0)
Total Lymphocyte: 38.5 %
WBC: 6.6 10*3/uL (ref 3.8–10.8)

## 2019-12-28 LAB — LIPID PANEL
Cholesterol: 112 mg/dL (ref <200)
HDL: 49 mg/dL — ABNORMAL LOW (ref >=50)
LDL Cholesterol (Calc): 46 mg/dL (calc)
Non-HDL Cholesterol (Calc): 63 mg/dL (calc) (ref <130)
Total CHOL/HDL Ratio: 2.3 (calc) (ref <5.0)
Triglycerides: 86 mg/dL (ref <150)

## 2019-12-28 LAB — MAGNESIUM: Magnesium: 2 mg/dL (ref 1.5–2.5)

## 2019-12-28 LAB — TSH: TSH: 1.76 mIU/L (ref 0.40–4.50)

## 2019-12-29 ENCOUNTER — Ambulatory Visit: Payer: Medicare PPO | Attending: Internal Medicine

## 2019-12-29 DIAGNOSIS — Z23 Encounter for immunization: Secondary | ICD-10-CM | POA: Insufficient documentation

## 2019-12-29 NOTE — Progress Notes (Signed)
   Covid-19 Vaccination Clinic  Name:  Monica Curry    MRN: [id number] DOB: 18-Jan-1931  12/29/2019  [name] was observed post Covid-19 immunization for 30 minutes based on pre-vaccination screening without incidence. She was provided with Vaccine Information Sheet and instruction to access the V-Safe system.   [name] was instructed to call 911 with any severe reactions post vaccine: Marland Kitchen Difficulty breathing  . Swelling of your face and throat  . A fast heartbeat  . A bad rash all over your body  . Dizziness and weakness    Immunizations Administered    Name Date Dose VIS Date Route   Pfizer COVID-19 Vaccine 12/29/2019  9:12 AM 0.3 mL 11/19/2019 Intramuscular   Manufacturer: [place]   Lot: [id number]   [place]: [id number]

## 2020-01-02 NOTE — Progress Notes (Signed)
+   Cardiology Office Note   Date:  [id number]   ID:  Monica Curry, DOB [id number], MRN [id number]  PCP:  Unk Pinto, MD  Cardiologist:   Dorris Carnes, MD   F/U of HTN     History of Present Illness: Monica Curry is a 86 y.o. female with a history of a TIA with expressive aphasia, HTN,  SVT (on event monitor; placed on diltiazem) CKD stage III, hx of pulmonary embolism, DVT, essential tremors and presyncope seen for follow up.  I saw the pt in Dec 2019   She was seen by B Bhagat in July 2020    Since seen she has had a few short dizzy spells  No syncope.  Denies palpitations      6 wks ago she woke up and didn't feel good   Resolved   Nonspecifc BP up a little   Has not had problems since   Breathing is OK   No CP     Staying fairly secluded with COVID pandemic  She fell 1 week ago taking shoe off.   Fell back on can  Has bruise on buttock  Not sore      Current Meds  Medication Sig  . aspirin 325 MG tablet Take 325 mg by mouth daily.  Marland Kitchen atorvastatin (LIPITOR) 10 MG tablet Take 1 tablet Daily for Cholesterol  . Calcium-Magnesium-Vitamin D 300-20-200 MG-MG-UNIT CHEW Chew 1 tablet daily by mouth.   . Cholecalciferol (VITAMIN D3) 5000 units TABS Take 2 tablets by mouth daily.   . clonazePAM (KLONOPIN) 0.5 MG tablet TAKE 1 TABLET BY MOUTH EVERY MORNING,1/2 TABLET AT NOON AND 1/2 AT NIGHT  . Magnesium 250 MG TABS Take 250 mg by mouth 2 (two) times a day.  . propranolol (INDERAL) 40 MG tablet TAKE 1 TABLET BY MOUTH FOUR TIMES DAILY WITH MEALS AND BEDTIME FOR BLOOD PRESSURE AND TREMOR  . vitamin B-12 (CYANOCOBALAMIN) 500 MCG tablet Take 500 mcg by mouth daily. Sublingual     Allergies:   Codeine, Meloxicam, Mysoline [primidone], Pneumovax 23 [pneumococcal vac polyvalent], and Toradol [ketorolac tromethamine]   Past Medical History:  Diagnosis Date  . C. difficile colitis   . Cancer [facility])    Endometrial cancer  . Essential and other specified forms of tremor   .  Osteoporosis 11/2019   T score -2.8 stable from prior DEXA  . Personal history of venous thrombosis and embolism 1997  . Shingles   . Stroke ([place])   . Thrush   . TIA (transient ischemic attack)     Past Surgical History:  Procedure Laterality Date  . APPENDECTOMY    . [place]  . TONSILLECTOMY AND ADENOIDECTOMY    . TOTAL ABDOMINAL HYSTERECTOMY W/ BILATERAL SALPINGOOPHORECTOMY  1997     Social History:  The patient  reports that she has never smoked. She has never used smokeless tobacco. She reports current alcohol use. She reports that she does not use drugs.   Family History:  The patient's family history includes Blindness in her sister; Cancer in her father and mother; Dementia in her mother; Diabetes in her sister; Heart disease in her brother, brother, brother, sister, and sister; Hypertension in her sister; Kidney failure in her brother and sister; Prostate cancer in her brother and brother; Skin cancer in her sister.    ROS:  Please see the history of present illness. All other systems are reviewed and  Negative to the above problem except as noted.  PHYSICAL EXAM: VS:  BP 130/74   Pulse 63   Ht 5\' 3"  (1.6 m)   Wt 153 lb 9.6 oz (69.7 kg)   SpO2 99%   BMI 27.21 kg/m   GEN: Well nourished, well developed in no acute distress  HEENT: normal  Neck: no JVD, carotid bruits, or masses Cardiac: RRR; no murmurs, rubs, or gallops,no LE edema  Respiratory:  clear to auscultation bilaterally, normal work of breathing GI: soft, nontender, nondistended, + BS  No hepatomegaly  MS: no deformity Moving all extremities   Skin: warm and dry, Bruise on R buttock   Neuro:  Strength and sensation are intact Psych: euthymic mood, full affect   EKG:  EKG is ordered today.   Lipid Panel    Component Value Date/Time   CHOL 112 12/27/2019 1523   TRIG 86 12/27/2019 1523   HDL 49 (L) 12/27/2019 1523   CHOLHDL 2.3 12/27/2019 1523   VLDL 24 10/24/2017 0629   LDLCALC  46 12/27/2019 1523      Wt Readings from Last 3 Encounters:  01/03/20 153 lb 9.6 oz (69.7 kg)  12/27/19 154 lb (69.9 kg)  11/11/19 154 lb 6.4 oz (70 kg)    Additional studies/ records that were reviewed today include:   Stress test 04/2018  Nuclear stress EF: 76%.  There was no ST segment deviation noted during stress.  No T wave inversion was noted during stress.  The study is normal.  This is a low risk study.  The left ventricular ejection fraction is hyperdynamic (>65%).  Low risk stress nuclear study with normal perfusion and normal left ventricular regional and global systolic function.  Monitor 01/2018 NSR with sinus bradycardia 2. Atrial tachycardia at 160 is present 3. Rare PVC's 4. Noise artifact  Echo 10/2017 Study Conclusions  - Left ventricle: The cavity size was normal. Systolic function was normal. The estimated ejection fraction was in the range of 55% to 60%. Wall motion was normal; there were no regional wall motion abnormalities. Doppler parameters are consistent with abnormal left ventricular relaxation (grade 1 diastolic dysfunction). - Mitral valve: There was mild regurgitation. - Left atrium: The atrium was moderately dilated.  Impressions:  - No cardiac source of emboli was indentified.   ASSESSMENT AND PLAN:  1   HTN   I have asked her to follow at home  She says it has been up and down   Stay hydrated  2  HL   Keep on lipitor   Last LDL 46  3   Hx SVT   On monitor   She denies palpitations  4   Hx TIA Keep on ASA and lipitor     F/U in 6 months     Current medicines are reviewed at length with the patient today.  The patient does not have concerns regarding medicines.  Signed, Dorris Carnes, MD  01/03/2020 11:18 AM    [address], [place],   [address] Phone: [phone]; Fax: [phone]

## 2020-01-03 ENCOUNTER — Encounter: Payer: Self-pay | Admitting: Internal Medicine

## 2020-01-03 ENCOUNTER — Ambulatory Visit: Payer: Medicare Other | Admitting: Internal Medicine

## 2020-01-03 ENCOUNTER — Other Ambulatory Visit: Payer: Self-pay

## 2020-01-03 VITALS — BP 130/74 | HR 63 | Ht 63.0 in | Wt 153.6 lb

## 2020-01-03 DIAGNOSIS — I10 Essential (primary) hypertension: Secondary | ICD-10-CM

## 2020-01-03 DIAGNOSIS — I471 Supraventricular tachycardia: Secondary | ICD-10-CM | POA: Diagnosis not present

## 2020-01-03 DIAGNOSIS — E782 Mixed hyperlipidemia: Secondary | ICD-10-CM | POA: Diagnosis not present

## 2020-01-14 ENCOUNTER — Telehealth: Payer: Self-pay

## 2020-01-14 ENCOUNTER — Other Ambulatory Visit: Payer: Self-pay | Admitting: Internal Medicine

## 2020-01-14 DIAGNOSIS — I10 Essential (primary) hypertension: Secondary | ICD-10-CM

## 2020-01-14 MED ORDER — LISINOPRIL-HYDROCHLOROTHIAZIDE 20-12.5 MG PO TABS: ORAL_TABLET | ORAL | 3 refills | Status: DC

## 2020-01-14 NOTE — Telephone Encounter (Signed)
Patient concerned about her blood pressure. BP was 179/86. Dr. Melford Aase aware and sending in a new prescription. Patient aware.

## 2020-01-16 ENCOUNTER — Ambulatory Visit: Payer: Medicare PPO | Attending: Internal Medicine

## 2020-01-16 DIAGNOSIS — Z23 Encounter for immunization: Secondary | ICD-10-CM | POA: Insufficient documentation

## 2020-01-16 NOTE — Progress Notes (Signed)
   Covid-19 Vaccination Clinic  Name:  Monica Curry    MRN: [id number] DOB: 02-03-1931  01/16/2020  Monica Curry was observed post Covid-19 immunization for 15 minutes without incidence. She was provided with Vaccine Information Sheet and instruction to access the V-Safe system.   Monica Curry was instructed to call 911 with any severe reactions post vaccine: Marland Kitchen Difficulty breathing  . Swelling of your face and throat  . A fast heartbeat  . A bad rash all over your body  . Dizziness and weakness    Immunizations Administered    Name Date Dose VIS Date Route   Pfizer COVID-19 Vaccine 01/16/2020 11:39 AM 0.3 mL 11/19/2019 Intramuscular   Manufacturer: [place]   Lot: [id number]   [place]: [id number]

## 2020-01-20 ENCOUNTER — Other Ambulatory Visit: Payer: Self-pay | Admitting: Internal Medicine

## 2020-01-20 ENCOUNTER — Telehealth: Payer: Self-pay | Admitting: *Deleted

## 2020-01-20 DIAGNOSIS — I10 Essential (primary) hypertension: Secondary | ICD-10-CM

## 2020-01-20 MED ORDER — DOXAZOSIN MESYLATE 8 MG PO TABS: ORAL_TABLET | ORAL | 3 refills | Status: DC

## 2020-01-20 NOTE — Telephone Encounter (Signed)
Patient sent in BP list with reading from 181/94 on 01/15/2020 to a low of 137/79 today.  Per Dr Melford Aase, continue Propanolol 40 mg 4 times a day, Lisinopril/HCT 20/12.5 and add new RX for Doxazosin 8 mg 1/2 to 1 tablet at bedtime, but start with 1/2 tablet. Patient is aware and will keep BP readings.

## 2020-01-21 ENCOUNTER — Telehealth: Payer: Self-pay | Admitting: *Deleted

## 2020-01-21 NOTE — Telephone Encounter (Signed)
Patient called and reportd her BP was low last night, after taking Doxazosin 8 mg 1/2 tablet at bedtime. She states she had dizziness during the night.  Her BP this morning was 94/55. She reported she did not take her Lisinopril/HCT this morning, only her Propranolol. Per Dr Melford Aase, do not take the Doxazosin at this time and hold her Lisinopril/HTZ until her systolic is greater than [id number]. She was advised to continue to take the Propranolol. Patient is aware.

## 2020-01-27 ENCOUNTER — Ambulatory Visit: Payer: Medicare Other | Admitting: Adult Health

## 2020-02-07 ENCOUNTER — Other Ambulatory Visit: Payer: Self-pay

## 2020-02-07 ENCOUNTER — Ambulatory Visit: Payer: Medicare PPO | Admitting: Physician Assistant

## 2020-02-07 VITALS — BP 132/76 | HR 56 | Temp 97.5°F | Wt 154.0 lb

## 2020-02-07 DIAGNOSIS — R202 Paresthesia of skin: Secondary | ICD-10-CM

## 2020-02-07 DIAGNOSIS — N183 Chronic kidney disease, stage 3 unspecified: Secondary | ICD-10-CM | POA: Diagnosis not present

## 2020-02-07 DIAGNOSIS — Z79899 Other long term (current) drug therapy: Secondary | ICD-10-CM | POA: Diagnosis not present

## 2020-02-07 DIAGNOSIS — R0789 Other chest pain: Secondary | ICD-10-CM

## 2020-02-07 DIAGNOSIS — I10 Essential (primary) hypertension: Secondary | ICD-10-CM

## 2020-02-07 NOTE — Progress Notes (Signed)
Assessment and Plan: Essential hypertension Discussed proper way to take BP Goal 120-130, DASH diet discussed Go to the ER if any chest pain, shortness of breath, nausea, dizziness, severe HA, changes vision/speech -     CBC with Differential/Platelet -     COMPLETE METABOLIC PANEL WITH GFR -     EKG 12-Lead -     DG Chest 2 View; Future  Stage 3 chronic kidney disease, unspecified whether stage 3a or 3b CKD -     Parathyroid Hormone, Intact w/Ca - with kidney function and symptoms will check PTH - check potassium, kidney function, magnesium with recent addition of fluid pill  Chest pressure -     EKG 12-Lead -     DG Chest 2 View; Future Recent normal stress test 04/2019, normal EKG in the office other than sinus bradycardia- will monitor.  - get Xray -no chest wall pain - monitor closely and follow up with Dr. Harrington Challenger  Medication management -     Magnesium  Tingling in extremities Normal neuro exam ? Cervical spine since more positional and bilateral Will get Cervical neck Xray, get cervical neck pillow She was informed to call 911 if she develop any new symptoms such as worsening headaches, episodes of blurred vision, double vision or complete loss of vision or speech difficulties or motor weakness.     HPI 86 y.o.female with history of pSVT, TIA, CKD stage 3, HTN, chol, tremor, b12 def presents for follow up for blood pressure and tingling.   She is on propranolol 40mg  QID for tremor and SVT.   She has a new BP cuff.  She had dizziness with her heart skipping a beat, then that night she tingling in her left arm with lying down reading without any issues. States this had resolved, but then on Feb 4th she did not sleep well, woke up with a chill and that morning had a BP of 182/88, then 179/86. She was started on lisinopril/HCTZ on 01/13/2019. Called reporting BP 181/94-137/79 with feeling of BP up with a chest tightness, added on doxazosin 8 mg 1/2 pill daily on 01/20/2020.  She then had a very low BP of 94/55, was told to stop doxazosin and only take lisinopril if BP systolic over Q000111Q.   She has been on the lisinopril for 8 days now total. She states she has been having chest tightness/pressure that is worse in the AM, no radiation, constant, never goes away, no cough, some with exertion and some SOB, leg cramps, bilateral numbness/tingling only at night with waking up in the middle of the night. This AM took 1/2 and feels better today.    Stress test 04/2018 was normal Blood clot 97 after surgery- no leg swelling, no estrogen, no recently surgery, no immobility.  Lab Results  Component Value Date   CREATININE 1.04 (H) 12/27/2019   BUN 18 12/27/2019   NA 136 12/27/2019   K 5.1 12/27/2019   CL 103 12/27/2019   CO2 29 12/27/2019     Lab Results  Component Value Date   GFRNONAA 49 (L) 12/27/2019    Lab Results  Component Value Date   VITAMINB12 501 06/16/2019    Blood pressure 132/76, pulse (!) 56, temperature (!) 97.5 F (36.4 C), weight 154 lb (69.9 kg), SpO2 98 %.   Patient Active Problem List   Diagnosis Date Noted  . Paroxysmal supraventricular tachycardia ([place]) 10/20/2019  . Primary restrictive cardiomyopathy ([place]) 10/20/2019  . History of skin cancer (basal cell)  06/16/2019  . Complete right bundle branch block 06/16/2019  . B12 deficiency 06/15/2019  . Leg swelling 04/21/2018  . History of TIA (transient ischemic attack) 10/23/2017  . Chronic kidney disease, stage III (moderate) 07/21/2015  . Medication management 07/04/2014  . Essential hypertension 11/25/2013  . Mixed hyperlipidemia 11/25/2013  . Other abnormal glucose 11/25/2013  . Vitamin D deficiency 11/25/2013  . Essential tremor 11/25/2013  . History of endometrial cancer   . Hx/o DVT/PE   . Urethral prolapse   . Osteoporosis 10/10/2011      Current Outpatient Medications (Cardiovascular):  .  atorvastatin (LIPITOR) 10 MG tablet, Take 1 tablet Daily for Cholesterol .   lisinopril-hydrochlorothiazide (ZESTORETIC) 20-12.5 MG tablet, Take 1 tablet every morning for BP .  propranolol (INDERAL) 40 MG tablet, TAKE 1 TABLET BY MOUTH FOUR TIMES DAILY WITH MEALS AND BEDTIME FOR BLOOD PRESSURE AND TREMOR   Current Outpatient Medications (Analgesics):  .  aspirin 325 MG tablet, Take 325 mg by mouth daily.  Current Outpatient Medications (Hematological):  .  vitamin B-12 (CYANOCOBALAMIN) 500 MCG tablet, Take 500 mcg by mouth daily. Sublingual  Current Outpatient Medications (Other):  Marland Kitchen  Calcium-Magnesium-Vitamin D 300-20-200 MG-MG-UNIT CHEW, Chew 1 tablet daily by mouth.  .  Cholecalciferol (VITAMIN D3) 5000 units TABS, Take 2 tablets by mouth daily.  .  clonazePAM (KLONOPIN) 0.5 MG tablet, TAKE 1 TABLET BY MOUTH EVERY MORNING,1/2 TABLET AT NOON AND 1/2 AT NIGHT .  Magnesium 250 MG TABS, Take 250 mg by mouth 2 (two) times a day.  Allergies  Allergen Reactions  . Codeine Hives  . Meloxicam Nausea Only    Gastritis and nausea  . Mysoline [Primidone]   . Pneumovax 23 [Pneumococcal Vac Polyvalent]     Red, raised area.  Knot at site of injection  . Toradol [Ketorolac Tromethamine] Swelling    Localized redness at injection site.      ROS: all negative except above.   Physical Exam: Filed Weights   02/07/20 1613  Weight: 154 lb (69.9 kg)   BP 132/76   Pulse (!) 56   Temp (!) 97.5 F (36.4 C)   Wt 154 lb (69.9 kg)   SpO2 98%   BMI 27.28 kg/m  General Appearance: Well nourished, in no apparent distress. Eyes: PERRLA, EOMs, conjunctiva no swelling or erythema Sinuses: No Frontal/maxillary tenderness ENT/Mouth: Ext aud canals clear, TMs without erythema, bulging. No erythema, swelling, or exudate on post pharynx.  Tonsils not swollen or erythematous. Hearing normal.  Neck: Supple, thyroid normal.  Respiratory: Respiratory effort normal, BS equal bilaterally without rales, rhonchi, wheezing or stridor.  Cardio: RRR with no MRGs. Brisk peripheral pulses  without edema.  Abdomen: Soft, + BS.  Non tender, no guarding, rebound, hernias, masses. Lymphatics: Non tender without lymphadenopathy.  Musculoskeletal: Full ROM, 5/5 strength, normal gait.  Skin: Warm, dry without rashes, lesions, ecchymosis.  Neuro: Cranial nerves intact. Normal muscle tone, no pronator drift, abnormal finger to nose due to tremor , no cerebellar symptoms. Sensation intact.  Psych: Awake and oriented X 3, normal affect, Insight and Judgment appropriate.     Vicie Mutters, PA-C 4:23 PM [facility]

## 2020-02-07 NOTE — Progress Notes (Deleted)
Assessment and Plan:    HPI 86 y.o.female presents for follow up of blood pressure.  She was having elevated BP of 180's, doxazosin 8 mg 1/2 pill was added to her Propanolol 40 mg QID and lisinopil/HCTZ 20/12.5- she had a very low BP of   94/55. She reported she did not take her Lisinopril/HCT this morning, only her Propranolol. Per Dr Melford Aase, do not take the Doxazosin at this time and hold her Lisinopril/HTZ until her systolic is greater than Q000111Q. She was advised to continue to take the Propranolol.  Patient Active Problem List   Diagnosis Date Noted  . Paroxysmal supraventricular tachycardia ([place]) 10/20/2019  . Primary restrictive cardiomyopathy ([place]) 10/20/2019  . History of skin cancer (basal cell) 06/16/2019  . Complete right bundle branch block 06/16/2019  . B12 deficiency 06/15/2019  . Leg swelling 04/21/2018  . History of TIA (transient ischemic attack) 10/23/2017  . Chronic kidney disease, stage III (moderate) 07/21/2015  . Medication management 07/04/2014  . Essential hypertension 11/25/2013  . Mixed hyperlipidemia 11/25/2013  . Other abnormal glucose 11/25/2013  . Vitamin D deficiency 11/25/2013  . Essential tremor 11/25/2013  . History of endometrial cancer   . Hx/o DVT/PE   . Urethral prolapse   . Osteoporosis 10/10/2011      Current Outpatient Medications (Cardiovascular):  .  atorvastatin (LIPITOR) 10 MG tablet, Take 1 tablet Daily for Cholesterol .  doxazosin (CARDURA) 8 MG tablet, Take 1/2 to 1  tablet at Bedtime for BP .  lisinopril-hydrochlorothiazide (ZESTORETIC) 20-12.5 MG tablet, Take 1 tablet every morning for BP .  propranolol (INDERAL) 40 MG tablet, TAKE 1 TABLET BY MOUTH FOUR TIMES DAILY WITH MEALS AND BEDTIME FOR BLOOD PRESSURE AND TREMOR   Current Outpatient Medications (Analgesics):  .  aspirin 325 MG tablet, Take 325 mg by mouth daily.  Current Outpatient Medications (Hematological):  .  vitamin B-12 (CYANOCOBALAMIN) 500 MCG tablet, Take  500 mcg by mouth daily. Sublingual  Current Outpatient Medications (Other):  Marland Kitchen  Calcium-Magnesium-Vitamin D 300-20-200 MG-MG-UNIT CHEW, Chew 1 tablet daily by mouth.  .  Cholecalciferol (VITAMIN D3) 5000 units TABS, Take 2 tablets by mouth daily.  .  clonazePAM (KLONOPIN) 0.5 MG tablet, TAKE 1 TABLET BY MOUTH EVERY MORNING,1/2 TABLET AT NOON AND 1/2 AT NIGHT .  Magnesium 250 MG TABS, Take 250 mg by mouth 2 (two) times a day.  Allergies  Allergen Reactions  . Codeine Hives  . Meloxicam Nausea Only    Gastritis and nausea  . Mysoline [Primidone]   . Pneumovax 23 [Pneumococcal Vac Polyvalent]     Red, raised area.  Knot at site of injection  . Toradol [Ketorolac Tromethamine] Swelling    Localized redness at injection site.      ROS: all negative except above.   Physical Exam: There were no vitals filed for this visit. There were no vitals taken for this visit. General Appearance: Well nourished, in no apparent distress. Eyes: PERRLA, EOMs, conjunctiva no swelling or erythema Sinuses: No Frontal/maxillary tenderness ENT/Mouth: Ext aud canals clear, TMs without erythema, bulging. No erythema, swelling, or exudate on post pharynx.  Tonsils not swollen or erythematous. Hearing normal.  Neck: Supple, thyroid normal.  Respiratory: Respiratory effort normal, BS equal bilaterally without rales, rhonchi, wheezing or stridor.  Cardio: RRR with no MRGs. Brisk peripheral pulses without edema.  Abdomen: Soft, + BS.  Non tender, no guarding, rebound, hernias, masses. Lymphatics: Non tender without lymphadenopathy.  Musculoskeletal: Full ROM, 5/5 strength, normal gait.  Skin: Warm,  dry without rashes, lesions, ecchymosis.  Neuro: Cranial nerves intact. Normal muscle tone, no cerebellar symptoms. Sensation intact.  Psych: Awake and oriented X 3, normal affect, Insight and Judgment appropriate.     Vicie Mutters, PA-C 12:47 PM [facility]

## 2020-02-07 NOTE — Patient Instructions (Addendum)
Follow up with your heart doctor dr. Harrington Challenger  Go to the ER if any chest pain, shortness of breath, nausea, dizziness, severe HA, changes vision/speech  Will check kidney function for the HCTZ that you are on.  Depending your kidney function may do JUST lisinopril or switch to amlodipine  INFORMATION ABOUT YOUR XRAY [place] [facility] Can walk into [address] building for an Insurance account manager. They will have the order and take you back. You do not any paper work, I should get the result back today or tomorrow. This order is good for a year.  Can call [phone] to schedule an appointment if you wish.   HYPERTENSION INFORMATION  Monitor your blood pressure at home, please keep a record and bring that in with you to your next office visit.   Go to the ER if any CP, SOB, nausea, dizziness, severe HA, changes vision/speech  Testing/Procedures: HOW TO TAKE YOUR BLOOD PRESSURE:  Rest 5 minutes before taking your blood pressure.  Don't smoke or drink caffeinated beverages for at least 30 minutes before.  Take your blood pressure before (not after) you eat.  Sit comfortably with your back supported and both feet on the floor (don't cross your legs).  Elevate your arm to heart level on a table or a desk.  Use the proper sized cuff. It should fit smoothly and snugly around your bare upper arm. There should be enough room to slip a fingertip under the cuff. The bottom edge of the cuff should be 1 inch above the crease of the elbow.  Due to a recent study, SPRINT, we have changed our goal for the systolic or top blood pressure number. Ideally we want your top number at 120.  In the [facility] Trial, 5000 people were randomized to a goal BP of 120 and 5000 people were randomized to a goal BP of less than 140. The patients with the goal BP at 120 had LESS DEMENTIA, LESS HEART ATTACKS, AND LESS STROKES, AS WELL AS OVERALL DECREASED MORTALITY OR DEATH RATE.   There was another study that showed taking your  blood pressure medications at night decrease cardiovascular events.  However if you are on a fluid pill, please take this in the morning.   If you are willing, our goal BP is the top number of 120.  Your most recent BP: BP: 132/76   Take your medications faithfully as instructed. Maintain a healthy weight. Get at least 150 minutes of aerobic exercise per week. Minimize salt intake. Minimize alcohol intake  DASH Eating Plan DASH stands for "Dietary Approaches to Stop Hypertension." The DASH eating plan is a healthy eating plan that has been shown to reduce high blood pressure (hypertension). Additional health benefits may include reducing the risk of type 2 diabetes mellitus, heart disease, and stroke. The DASH eating plan may also help with weight loss. WHAT DO I NEED TO KNOW ABOUT THE DASH EATING PLAN? For the DASH eating plan, you will follow these general guidelines:  Choose foods with a percent daily value for sodium of less than 5% (as listed on the food label).  Use salt-free seasonings or herbs instead of table salt or sea salt.  Check with your health care provider or pharmacist before using salt substitutes.  Eat lower-sodium products, often labeled as "lower sodium" or "no salt added."  Eat fresh foods.  Eat more vegetables, fruits, and low-fat dairy products.  Choose whole grains. Look for the word "whole" as the first word in the  ingredient list.  Choose fish and skinless chicken or [place] more often than red meat. Limit fish, poultry, and meat to 6 oz (170 g) each day.  Limit sweets, desserts, sugars, and sugary drinks.  Choose heart-healthy fats.  Limit cheese to 1 oz (28 g) per day.  Eat more home-cooked food and less restaurant, buffet, and fast food.  Limit fried foods.  Cook foods using methods other than frying.  Limit canned vegetables. If you do use them, rinse them well to decrease the sodium.  When eating at a restaurant, ask that your food be  prepared with less salt, or no salt if possible. WHAT FOODS CAN I EAT? Seek help from a dietitian for individual calorie needs. Grains Whole grain or whole wheat bread. Brown rice. Whole grain or whole wheat pasta. Quinoa, bulgur, and whole grain cereals. Low-sodium cereals. Corn or whole wheat flour tortillas. Whole grain cornbread. Whole grain crackers. Low-sodium crackers. Vegetables Fresh or frozen vegetables (raw, steamed, roasted, or grilled). Low-sodium or reduced-sodium tomato and vegetable juices. Low-sodium or reduced-sodium tomato sauce and paste. Low-sodium or reduced-sodium canned vegetables.  Fruits All fresh, canned (in natural juice), or frozen fruits. Meat and Other Protein Products Ground beef (85% or leaner), grass-fed beef, or beef trimmed of fat. Skinless chicken or [place]. Ground chicken or [place]. Pork trimmed of fat. All fish and seafood. Eggs. Dried beans, peas, or lentils. Unsalted nuts and seeds. Unsalted canned beans. Dairy Low-fat dairy products, such as skim or 1% milk, 2% or reduced-fat cheeses, low-fat ricotta or cottage cheese, or plain low-fat yogurt. Low-sodium or reduced-sodium cheeses. Fats and Oils Tub margarines without trans fats. Light or reduced-fat mayonnaise and salad dressings (reduced sodium). Avocado. Safflower, olive, or canola oils. Natural peanut or almond butter. Other Unsalted popcorn and pretzels. The items listed above may not be a complete list of recommended foods or beverages. Contact your dietitian for more options. WHAT FOODS ARE NOT RECOMMENDED? Grains White bread. White pasta. White rice. Refined cornbread. Bagels and croissants. Crackers that contain trans fat. Vegetables Creamed or fried vegetables. Vegetables in a cheese sauce. Regular canned vegetables. Regular canned tomato sauce and paste. Regular tomato and vegetable juices. Fruits Dried fruits. Canned fruit in light or heavy syrup. Fruit juice. Meat and Other Protein  Products Fatty cuts of meat. Ribs, chicken wings, bacon, sausage, bologna, salami, chitterlings, fatback, hot dogs, bratwurst, and packaged luncheon meats. Salted nuts and seeds. Canned beans with salt. Dairy Whole or 2% milk, cream, half-and-half, and cream cheese. Whole-fat or sweetened yogurt. Full-fat cheeses or blue cheese. Nondairy creamers and whipped toppings. Processed cheese, cheese spreads, or cheese curds. Condiments Onion and garlic salt, seasoned salt, table salt, and sea salt. Canned and packaged gravies. Worcestershire sauce. Tartar sauce. Barbecue sauce. Teriyaki sauce. Soy sauce, including reduced sodium. Steak sauce. Fish sauce. Oyster sauce. Cocktail sauce. Horseradish. Ketchup and mustard. Meat flavorings and tenderizers. Bouillon cubes. Hot sauce. Tabasco sauce. Marinades. Taco seasonings. Relishes. Fats and Oils Butter, stick margarine, lard, shortening, ghee, and bacon fat. Coconut, palm kernel, or palm oils. Regular salad dressings. Other Pickles and olives. Salted popcorn and pretzels. The items listed above may not be a complete list of foods and beverages to avoid. Contact your dietitian for more information. WHERE CAN I FIND MORE INFORMATION? National Heart, Lung, and Blood Institute: travelstabloid.com Document Released: 11/14/2011 Document Revised: 04/11/2014 Document Reviewed: 09/29/2013 [facility] Patient Information 2015 [place], [place]. This information is not intended to replace advice given to you by your health care  provider. Make sure you discuss any questions you have with your health care provider.  WOMEN AND HEART ATTACKS  Being a woman you may not have the typical symptoms of a heart attack.  You may not have any pain OR you may have atypical pain such as jaw pain, upper back pain, arm pain, "my bra feels to tight" and you will often have symptoms with it like below.  Symptoms for a heart attack will likely occur when you  exert your self or exercise and include: Shortness of breath Sweating Nausea Dizziness Fast or irregular heart beats Fatigue   It makes me feel better if my patients get their heart rate up with exercise once or twice a week and pay close attention to your body. If there is ANY change in your exercise capacity or if you have symptoms above, please STOP and call 911 or call to come to the office.   Here is some information to help you keep your heart healthy: Move it! - Aim for 30 mins of activity every day. Take it slowly at first. Talk to [place] before starting any new exercise program.   Lose it.  -Body Mass Index (BMI) can indicate if you need to lose weight. A healthy range is 18.5-24.9. For a BMI calculator, go to [employer]  Waist Management -Excess abdominal fat is a risk factor for heart disease, diabetes, asthma, stroke and more. Ideal waist circumference is less than 35" for women and less than 40" for men.   Eat Right -focus on fruits, vegetables, whole grains, and meals you make yourself. Avoid foods with trans fat and high sugar/sodium content.   Snooze or Snore? - Loud snoring can be a sign of sleep apnea, a significant risk factor for high blood pressure, heart attach, stroke, and heart arrhythmias.  Kick the habit -Quit Smoking! Avoid second hand smoke. A single cigarette raises your blood pressure for 20 mins and increases the risk of heart attack and stroke for the next 24 hours.   Are Aspirin and Supplements right for you? -Add ENTERIC COATED low dose 81 mg Aspirin daily OR can do every other day if you have easy bruising to protect your heart and head. As well as to reduce risk of Colon Cancer by 20 %, Skin Cancer by 26 % , Melanoma by 46% and Pancreatic cancer by 60%  Say "No to Stress -There may be little you can do about problems that cause stress. However, techniques such as long walks, meditation, and exercise can help you manage it.   Start Now! - Make  changes one at a time and set reasonable goals to increase your likelihood of success.

## 2020-02-08 ENCOUNTER — Other Ambulatory Visit: Payer: Self-pay

## 2020-02-08 ENCOUNTER — Ambulatory Visit
Admission: RE | Admit: 2020-02-08 | Discharge: 2020-02-08 | Disposition: A | Discharge status: Home / Self Care | Payer: Medicare PPO | Source: Ambulatory Visit | Attending: Physician Assistant | Admitting: Physician Assistant

## 2020-02-08 ENCOUNTER — Encounter: Payer: Self-pay | Admitting: Physician Assistant

## 2020-02-08 ENCOUNTER — Ambulatory Visit: Payer: Medicare PPO | Admitting: Physician Assistant

## 2020-02-08 DIAGNOSIS — R0789 Other chest pain: Secondary | ICD-10-CM

## 2020-02-08 DIAGNOSIS — M47812 Spondylosis without myelopathy or radiculopathy, cervical region: Secondary | ICD-10-CM | POA: Diagnosis not present

## 2020-02-08 DIAGNOSIS — R0602 Shortness of breath: Secondary | ICD-10-CM | POA: Diagnosis not present

## 2020-02-08 DIAGNOSIS — R200 Anesthesia of skin: Secondary | ICD-10-CM | POA: Diagnosis not present

## 2020-02-08 DIAGNOSIS — R202 Paresthesia of skin: Secondary | ICD-10-CM

## 2020-02-08 DIAGNOSIS — I10 Essential (primary) hypertension: Secondary | ICD-10-CM

## 2020-02-08 LAB — COMPLETE METABOLIC PANEL WITH GFR
AG Ratio: 1.9 (calc) (ref 1.0–2.5)
ALT: 13 U/L (ref 6–29)
AST: 16 U/L (ref 10–35)
Albumin: 4.5 g/dL (ref 3.6–5.1)
Alkaline phosphatase (APISO): 44 U/L (ref 37–153)
BUN/Creatinine Ratio: 20 (calc) (ref 6–22)
BUN: 23 mg/dL (ref 7–25)
CO2: 28 mmol/L (ref 20–32)
Calcium: 10.2 mg/dL (ref 8.6–10.4)
Chloride: 92 mmol/L — ABNORMAL LOW (ref 98–110)
Creat: 1.15 mg/dL — ABNORMAL HIGH (ref 0.60–0.88)
GFR, Est African American: 50 mL/min/{1.73_m2} — ABNORMAL LOW (ref >=60)
GFR, Est Non African American: 43 mL/min/{1.73_m2} — ABNORMAL LOW (ref >=60)
Globulin: 2.4 g/dL (calc) (ref 1.9–3.7)
Glucose, Bld: 105 mg/dL — ABNORMAL HIGH (ref 65–99)
Potassium: 4.9 mmol/L (ref 3.5–5.3)
Sodium: 128 mmol/L — ABNORMAL LOW (ref 135–146)
Total Bilirubin: 0.6 mg/dL (ref 0.2–1.2)
Total Protein: 6.9 g/dL (ref 6.1–8.1)

## 2020-02-08 LAB — CBC WITH DIFFERENTIAL/PLATELET
Absolute Monocytes: 877 cells/uL (ref 200–950)
Basophils Absolute: 95 cells/uL (ref 0–200)
Basophils Relative: 1.1 %
Eosinophils Absolute: 310 cells/uL (ref 15–500)
Eosinophils Relative: 3.6 %
HCT: 47.6 % — ABNORMAL HIGH (ref 35.0–45.0)
Hemoglobin: 15.4 g/dL (ref 11.7–15.5)
Lymphs Abs: 3010 cells/uL (ref 850–3900)
MCH: 28.2 pg (ref 27.0–33.0)
MCHC: 32.4 g/dL (ref 32.0–36.0)
MCV: 87.2 fL (ref 80.0–100.0)
MPV: 10.2 fL (ref 7.5–12.5)
Monocytes Relative: 10.2 %
Neutro Abs: 4309 cells/uL (ref 1500–7800)
Neutrophils Relative %: 50.1 %
Platelets: 381 10*3/uL (ref 140–400)
RBC: 5.46 10*6/uL — ABNORMAL HIGH (ref 3.80–5.10)
RDW: 14.5 % (ref 11.0–15.0)
Total Lymphocyte: 35 %
WBC: 8.6 10*3/uL (ref 3.8–10.8)

## 2020-02-08 LAB — PTH, INTACT AND CALCIUM
Calcium: 10.2 mg/dL (ref 8.6–10.4)
PTH: 34 pg/mL (ref 14–64)

## 2020-02-08 LAB — MAGNESIUM: Magnesium: 2.1 mg/dL (ref 1.5–2.5)

## 2020-02-08 MED ORDER — AMLODIPINE BESYLATE 5 MG PO TABS: ORAL_TABLET | ORAL | 11 refills | Status: DC

## 2020-02-10 ENCOUNTER — Other Ambulatory Visit: Payer: Self-pay

## 2020-02-10 ENCOUNTER — Other Ambulatory Visit: Payer: Medicare PPO

## 2020-02-10 VITALS — BP 122/80 | HR 74 | Temp 97.6°F | Wt 155.0 lb

## 2020-02-10 DIAGNOSIS — E871 Hypo-osmolality and hyponatremia: Secondary | ICD-10-CM | POA: Diagnosis not present

## 2020-02-10 LAB — COMPLETE METABOLIC PANEL WITH GFR
AG Ratio: 2.1 (calc) (ref 1.0–2.5)
ALT: 12 U/L (ref 6–29)
AST: 18 U/L (ref 10–35)
Albumin: 4.1 g/dL (ref 3.6–5.1)
Alkaline phosphatase (APISO): 42 U/L (ref 37–153)
BUN/Creatinine Ratio: 18 (calc) (ref 6–22)
BUN: 19 mg/dL (ref 7–25)
CO2: 29 mmol/L (ref 20–32)
Calcium: 9.3 mg/dL (ref 8.6–10.4)
Chloride: 93 mmol/L — ABNORMAL LOW (ref 98–110)
Creat: 1.07 mg/dL — ABNORMAL HIGH (ref 0.60–0.88)
GFR, Est African American: 54 mL/min/{1.73_m2} — ABNORMAL LOW (ref >=60)
GFR, Est Non African American: 47 mL/min/{1.73_m2} — ABNORMAL LOW (ref >=60)
Globulin: 2 g/dL (calc) (ref 1.9–3.7)
Glucose, Bld: 105 mg/dL — ABNORMAL HIGH (ref 65–99)
Potassium: 4.5 mmol/L (ref 3.5–5.3)
Sodium: 127 mmol/L — ABNORMAL LOW (ref 135–146)
Total Bilirubin: 0.6 mg/dL (ref 0.2–1.2)
Total Protein: 6.1 g/dL (ref 6.1–8.1)

## 2020-02-10 LAB — CBC WITH DIFFERENTIAL/PLATELET
Absolute Monocytes: 653 cells/uL (ref 200–950)
Basophils Absolute: 48 cells/uL (ref 0–200)
Basophils Relative: 0.7 %
Eosinophils Absolute: 211 cells/uL (ref 15–500)
Eosinophils Relative: 3.1 %
HCT: 43.1 % (ref 35.0–45.0)
Hemoglobin: 14.1 g/dL (ref 11.7–15.5)
Lymphs Abs: 2462 cells/uL (ref 850–3900)
MCH: 28.3 pg (ref 27.0–33.0)
MCHC: 32.7 g/dL (ref 32.0–36.0)
MCV: 86.5 fL (ref 80.0–100.0)
MPV: 10 fL (ref 7.5–12.5)
Monocytes Relative: 9.6 %
Neutro Abs: 3427 cells/uL (ref 1500–7800)
Neutrophils Relative %: 50.4 %
Platelets: 344 10*3/uL (ref 140–400)
RBC: 4.98 10*6/uL (ref 3.80–5.10)
RDW: 14.2 % (ref 11.0–15.0)
Total Lymphocyte: 36.2 %
WBC: 6.8 10*3/uL (ref 3.8–10.8)

## 2020-02-10 NOTE — Addendum Note (Signed)
Addended by: Chancy Hurter on: 02/10/2020 02:23 PM   Modules accepted: Orders

## 2020-02-10 NOTE — Progress Notes (Signed)
PATIENT reports for STAT labs which the orders were put in by the provider.  Vitals entered into EPIC as well.

## 2020-02-14 NOTE — Progress Notes (Signed)
WELLNESS VISIT  Assessment and Plan:  Wellness visit 1 year  Hyponatremia -     COMPLETE METABOLIC PANEL WITH GFR - likely from HCTZ however if her sodium is worse will send patient to the ER, if sodium is the same will get urine labs to rule out SIADH/refer to nephrology, if sodium is worse we will refer to the ER for monitored replacement.   Essential hypertension At goal; continue medication for now but if her sodium is normal some symptoms my be from norvasc, may switch to low dose benicar but will avoid diuretics in the future. May check carcinoid labs if not improved as well with flushing and diarrhea.  Monitor blood pressure at home; call if consistently over 130/80 Continue DASH diet.   Reminder to go to the ER if any CP, SOB, nausea, dizziness, severe HA, changes vision/speech, left arm numbness and tingling and jaw pain.  Complete RBBB Followed by cardiology; monitor   History of TIA (transient ischemic attack) Continue ASA; follow up with neurology Control blood pressure, cholesterol, glucose, increase exercise.   Essential tremor  Continue follow up with neuro, stable with inderal, clonazepam PRN  Osteoporosis, unspecified osteoporosis type, unspecified pathological fracture presence Curently on drug break with hx of bisphosphonate tx; recent DEXA shows stable T score; continue follow up with Dr. Soundra Pilon Continue vitamin D supplementation, recommend weight bearing exercises  History of endometrial cancer S/p hysterectomy; continue follow up with Dr. Soundra Pilon  Urethral prolapse S/p hysterectomy; continue follow up with Dr. Soundra Pilon   HT CKD 3 [facility]) Increase fluids, avoid NSAIDS, monitor sugars, will monitor  Vitamin D deficiency At goal; Continue supplementation Check vitamin D level  Hx/o DVT/PE Continue low dose ASA   Mixed hyperlipidemia Well controlled; continue statin Continue low cholesterol diet and exercise.  Check lipid panel  Other abnormal  glucose Discussed disease and risks Discussed diet/exercise, weight management  Check A1C  B12 deficiency Newly on supplement (sublingual); check levels today then as needed   Personal history of skin cancer (basal cell) Followed by derm  Orders Placed This Encounter  Procedures  . Serotonin serum  . CBC with Differential/Platelet  . COMPLETE METABOLIC PANEL WITH GFR  . TSH  . Magnesium   Discussed med's effects and SE's. Screening labs and tests as requested with regular follow-up as recommended. Over 40 minutes of exam, counseling, chart review, and complex, high level critical decision making was performed this visit.   Future Appointments  Date Time Provider [place]  03/27/2020 10:30 AM [name], NP [facility] None  03/27/2020  2:00 PM Vicie Mutters, PA-C [facility] None  06/29/2020  2:00 PM [name], NP [facility] None     HPI  86 y.o. female  presents for a wellness follow up and follow up for HTN.    She had lisinopril with HCTZ added which resulted in a low sodium, she was given strict ER precautions and was instructed to increase sodium and withhold water some which concerned her due to her kidney function which we will check again today.    This was discontinued and she was started on norvasc 5mg  1/2 tablet in addition to her propranolol 40mg  QID for tremor and SVT. States her BP has been running well however she is complaining of diarrhea in the morning since being on this medication 5-6 bristol scale just in AM, weakness/fatigue, flushing just in her face and some SOB.    BP Readings from Last 3 Encounters:  02/16/20 122/76  02/10/20 122/80  02/07/20 132/76   She was having chest pressure in the AM only, no radiation, constant, with leg cramps and bilateral numbness/tingling last visit, this has improved since being off the lisinopril. She had a normal CXR, had cervical neck xray that showed arthritis.   She is widowed, 1 living child. She  has 2 grandchildren, 1 is in [place] getting her masters. 21 year old sister is now blind due to temporal cell arteritis this year.   The patient was admitted in 10/2017 for a L Brain TIA w/ transient expressive aphasia. MRI, MRA, 2D echo, Carotid Dopplers, were Normal. Patient was released on LD bASA and continues on statin. She is followed by Neurology. Takes propranolol 40 mg QID daily and Clonazepam 0.5 mg PRN for tremors.  She is also followed by Dr. Soundra Pilon (GYN) annually who manages PAPs, MMG, DEXA. She is currently on drug break from bisphosphonate for osteoporosis.   BMI is Body mass index is 26.61 kg/m., she has been working on diet and exercise.  Wt Readings from Last 3 Encounters:  02/16/20 155 lb (70.3 kg)  02/10/20 155 lb (70.3 kg)  02/07/20 154 lb (69.9 kg)   Her blood pressure has been controlled at home, today [name] is BP: 122/76 She does workout. She denies chest pain, shortness of breath, dizziness. Follows with Dr. Harrington Challenger after evaluation for episodes of syncope last year; holter showed short burst of SVT, now on propranolol, 2D echo showed LVEF 55-60% with grade 1 DD. No further episodes.   She is on cholesterol medication (atorvastatin 10 mg daily) and denies myalgias. Her cholesterol is at goal. The cholesterol last visit was:   Lab Results  Component Value Date   CHOL 112 12/27/2019   HDL 49 (L) 12/27/2019   LDLCALC 46 12/27/2019   TRIG 86 12/27/2019   CHOLHDL 2.3 12/27/2019   She has been working on diet and exercise for glucose management, she is on bASA, and denies foot ulcerations, increased appetite, nausea, paresthesia of the feet, polydipsia, polyuria, visual disturbances, vomiting and weight loss. Last A1C in the office was:  Lab Results  Component Value Date   HGBA1C 5.4 09/21/2019   Last GFR: Lab Results  Component Value Date   GFRNONAA 47 (L) 02/10/2020   Patient is on Vitamin D supplement   Lab Results  Component Value Date   VD25OH 80  09/21/2019     She is on supplement, taking 1000 mcg tab sublingual daily.  Lab Results  Component Value Date   [id number] 06/16/2019    Current Medications:    Current Outpatient Medications (Cardiovascular):  .  amLODipine (NORVASC) 5 MG tablet, 1/2-1 tablet daily for blood pressure .  atorvastatin (LIPITOR) 10 MG tablet, Take 1 tablet Daily for Cholesterol .  propranolol (INDERAL) 40 MG tablet, TAKE 1 TABLET BY MOUTH FOUR TIMES DAILY WITH MEALS AND BEDTIME FOR BLOOD PRESSURE AND TREMOR   Current Outpatient Medications (Analgesics):  .  aspirin 325 MG tablet, Take 325 mg by mouth daily.  Current Outpatient Medications (Hematological):  .  vitamin B-12 (CYANOCOBALAMIN) 500 MCG tablet, Take 500 mcg by mouth daily. Sublingual  Current Outpatient Medications (Other):  Marland Kitchen  Calcium-Magnesium-Vitamin D 300-20-200 MG-MG-UNIT CHEW, Chew 1 tablet daily by mouth.  .  Cholecalciferol (VITAMIN D3) 5000 units TABS, Take 2 tablets by mouth daily.  .  clonazePAM (KLONOPIN) 0.5 MG tablet, TAKE 1 TABLET BY MOUTH EVERY MORNING,1/2 TABLET AT NOON AND 1/2 AT NIGHT .  Magnesium 250 MG TABS,  Take 250 mg by mouth 2 (two) times a day. Allergies:  Allergies  Allergen Reactions  . Codeine Hives  . Meloxicam Nausea Only    Gastritis and nausea  . Mysoline [Primidone]   . Pneumovax 23 [Pneumococcal Vac Polyvalent]     Red, raised area.  Knot at site of injection  . Toradol [Ketorolac Tromethamine] Swelling    Localized redness at injection site.     Medical History:  She has History of endometrial cancer; Hx/o DVT/PE; Osteoporosis; Urethral prolapse; Essential hypertension; Mixed hyperlipidemia; Other abnormal glucose; Vitamin D deficiency; Essential tremor; Medication management; Chronic kidney disease, stage III (moderate); History of TIA (transient ischemic attack); B12 deficiency; History of skin cancer (basal cell); Complete right bundle branch block; Paroxysmal supraventricular tachycardia  ([place]); and Primary restrictive cardiomyopathy ([place]) on their problem list. Health Maintenance:   Immunization History  Administered Date(s) Administered  . H1N1 01/03/2009  . Influenza Whole 09/23/2013  . Influenza, High Dose Seasonal PF 09/06/2014, 08/23/2015, 08/21/2016, 08/28/2017, 09/01/2018, 09/14/2019  . PFIZER SARS-COV-2 Vaccination 12/29/2019, 01/16/2020  . PPD Test 03/29/2014  . Pneumococcal Polysaccharide-23 03/29/2014  . Pneumococcal-Unspecified 12/09/2002  . Td 03/23/2013  . Zoster 12/09/2006    Preventative care: Last colonoscopy: 2011 DONE MGM 11/2019 ([facility])  DEXA 2016, 2018, 2020 - by Dr. Phineas Real, Osteoporosis, on break from bisphosphonates PAP 10/2018 negative, Dr. Phineas Real  Influenza 2020 TD 2014 Pneumonia 2015 prevnar 13 has allergy - D/C'd Zoster 2008  Names of Other Physician/Practitioners you currently use: 1. [facility] here for primary care 2. Dr Rutherford Guys, eye doctor, last visit 2019, has cataracts, monitoring 3. Dr Ennis Forts, [place], dentist, last visit 2020 & every 4 months 4. Dr. Elvera Lennox, derm, last visit 2019, goes annually, hx of basal cell   Patient Care Team: Unk Pinto, MD as PCP - General (Internal Medicine) Fay Records, MD as PCP - Cardiology (Cardiology) Ronald Lobo, MD as Consulting Physician (Gastroenterology) Rutherford Guys, MD as Consulting Physician (Ophthalmology) Clance, Armando Reichert, MD as Consulting Physician (Pulmonary Disease) Fontaine, Belinda Block, MD as Consulting Physician (Gynecology) Love, Alyson Locket, MD as Consulting Physician (Neurology) [name], MD as Attending Physician (Neurology) Harriett Sine, MD as Consulting Physician (Dermatology)  Surgical History:  She has a past surgical history that includes Cesarean section (1961); Tonsillectomy and adenoidectomy; Appendectomy; and Total abdominal hysterectomy w/ bilateral salpingoophorectomy (1997). Family History:   Herfamily history includes Blindness in her sister; Cancer in her father and mother; Dementia in her mother; Diabetes in her sister; Heart disease in her brother, brother, brother, sister, and sister; Hypertension in her sister; Kidney failure in her brother and sister; Prostate cancer in her brother and brother; Skin cancer in her sister. Social History:  She reports that she has never smoked. She has never used smokeless tobacco. She reports current alcohol use. She reports that she does not use drugs.  MEDICARE WELLNESS OBJECTIVES: Physical activity: Current Exercise Habits: The patient does not participate in regular exercise at present(house work and outside) Cardiac risk factors: Cardiac Risk Factors include: advanced age (>29men, >47 women);dyslipidemia;hypertension;sedentary lifestyle Depression/mood screen:   Depression screen [facility] 2/9 02/16/2020  Decreased Interest 0  Down, Depressed, Hopeless 0  PHQ - 2 Score 0    ADLs:  In your present state of health, do you have any difficulty performing the following activities: 02/16/2020 09/21/2019  Hearing? N N  Vision? Y N  Comment cataracts, can not drive at night -  Difficulty concentrating or making decisions? N  N  Walking or climbing stairs? N N  Dressing or bathing? N N  Doing errands, shopping? N N  Some recent data might be hidden     Cognitive Testing  Alert? Yes  Normal Appearance?Yes  Oriented to person? Yes  Place? Yes   Time? Yes  Recall of three objects?  Yes  Can perform simple calculations? Yes  Displays appropriate judgment?Yes  Can read the correct time from a watch face?Yes  EOL planning: Does Patient Have a Medical Advance Directive?: Yes Type of Advance Directive: Healthcare Power of Attorney, Living will Does patient want to make changes to medical advance directive?: No - Patient declined Copy of [place] in Chart?: No - copy requested    Review of Systems:q Review of Systems   Constitutional: Negative for malaise/fatigue and weight loss.  HENT: Negative for hearing loss and tinnitus.   Eyes: Negative for blurred vision and double vision.  Respiratory: Negative for cough, sputum production, shortness of breath and wheezing.   Cardiovascular: Positive for palpitations (Rare heart racing, has been worked up by cardiology). Negative for chest pain, orthopnea, claudication, leg swelling and PND.  Gastrointestinal: Negative for abdominal pain, blood in stool, constipation, diarrhea, heartburn, melena, nausea and vomiting.  Genitourinary: Negative.   Musculoskeletal: Negative for falls, joint pain and myalgias.  Skin: Negative for rash.  Neurological: Positive for tremors (Baseline, bilateral hands). Negative for dizziness, tingling, sensory change, weakness and headaches.  Endo/Heme/Allergies: Negative for polydipsia.  Psychiatric/Behavioral: Negative.  Negative for depression, memory loss, substance abuse and suicidal ideas. The patient is not nervous/anxious and does not have insomnia.   All other systems reviewed and are negative.   Physical Exam: Estimated body mass index is 26.61 kg/m as calculated from the following:   Height as of this encounter: 5\' 4"  (1.626 m).   Weight as of this encounter: 155 lb (70.3 kg). BP 122/76   Pulse 70   Temp 97.7 F (36.5 C)   Ht 5\' 4"  (1.626 m)   Wt 155 lb (70.3 kg)   SpO2 98%   BMI 26.61 kg/m  General Appearance: Well nourished, in no apparent distress.  Eyes: PERRLA, EOMs, conjunctiva no swelling or erythema, fundal exam deferred to ophth Sinuses: No Frontal/maxillary tenderness  ENT/Mouth: Ext aud canals clear, normal light reflex with TMs without erythema, bulging. Good dentition. No erythema, swelling, or exudate on post pharynx. Tonsils not swollen or erythematous. Hearing normal.  Neck: Supple, thyroid normal. No bruits  Respiratory: Respiratory effort normal, BS equal bilaterally without rales, rhonchi, wheezing  or stridor.  Cardio: RRR without murmurs, rubs or gallops. Brisk peripheral pulses without edema.  Chest: symmetric, with normal excursions and percussion.  Abdomen: Soft, nontender, no guarding, rebound, hernias, masses, or organomegaly.  Lymphatics: Non tender without lymphadenopathy.  Musculoskeletal: Full ROM all peripheral extremities,5/5 strength, and normal gait.  Skin: Warm, dry without rashes, lesions, ecchymosis. Neuro: Cranial nerves intact, reflexes equal bilaterally. Normal muscle tone, no cerebellar symptoms, tremor of bilateral hands, worse right hand, Sensation intact.  Psych: Awake and oriented X 3, normal affect, Insight and Judgment appropriate.    Medicare Attestation I have personally reviewed: The patient's medical and social history Their use of alcohol, tobacco or illicit drugs Their current medications and supplements The patient's functional ability including ADLs,fall risks, home safety risks, cognitive, and hearing and visual impairment Diet and physical activities Evidence for depression or mood disorders  The patient's weight, height, BMI, and visual acuity have been recorded in  the chart.  I have made referrals, counseling, and provided education to the patient based on review of the above and I have provided the patient with a written personalized care plan for preventive services.     Vicie Mutters 10:20 AM [facility]

## 2020-02-16 ENCOUNTER — Other Ambulatory Visit: Payer: Self-pay | Admitting: *Deleted

## 2020-02-16 ENCOUNTER — Encounter: Payer: Self-pay | Admitting: Physician Assistant

## 2020-02-16 ENCOUNTER — Other Ambulatory Visit: Payer: Self-pay

## 2020-02-16 ENCOUNTER — Ambulatory Visit (INDEPENDENT_AMBULATORY_CARE_PROVIDER_SITE_OTHER): Payer: Medicare PPO | Admitting: Physician Assistant

## 2020-02-16 VITALS — BP 122/76 | HR 70 | Temp 97.7°F | Ht 64.0 in | Wt 155.0 lb

## 2020-02-16 DIAGNOSIS — R6889 Other general symptoms and signs: Secondary | ICD-10-CM | POA: Diagnosis not present

## 2020-02-16 DIAGNOSIS — Z79899 Other long term (current) drug therapy: Secondary | ICD-10-CM | POA: Diagnosis not present

## 2020-02-16 DIAGNOSIS — I471 Supraventricular tachycardia, unspecified: Secondary | ICD-10-CM

## 2020-02-16 DIAGNOSIS — Z8673 Personal history of transient ischemic attack (TIA), and cerebral infarction without residual deficits: Secondary | ICD-10-CM

## 2020-02-16 DIAGNOSIS — E782 Mixed hyperlipidemia: Secondary | ICD-10-CM

## 2020-02-16 DIAGNOSIS — Z0001 Encounter for general adult medical examination with abnormal findings: Secondary | ICD-10-CM

## 2020-02-16 DIAGNOSIS — I425 Other restrictive cardiomyopathy: Secondary | ICD-10-CM | POA: Diagnosis not present

## 2020-02-16 DIAGNOSIS — E871 Hypo-osmolality and hyponatremia: Secondary | ICD-10-CM

## 2020-02-16 DIAGNOSIS — M810 Age-related osteoporosis without current pathological fracture: Secondary | ICD-10-CM

## 2020-02-16 DIAGNOSIS — Z0000 Encounter for general adult medical examination without abnormal findings: Secondary | ICD-10-CM

## 2020-02-16 DIAGNOSIS — I10 Essential (primary) hypertension: Secondary | ICD-10-CM

## 2020-02-16 DIAGNOSIS — R7309 Other abnormal glucose: Secondary | ICD-10-CM

## 2020-02-16 DIAGNOSIS — Z86718 Personal history of other venous thrombosis and embolism: Secondary | ICD-10-CM | POA: Diagnosis not present

## 2020-02-16 DIAGNOSIS — E559 Vitamin D deficiency, unspecified: Secondary | ICD-10-CM | POA: Diagnosis not present

## 2020-02-16 DIAGNOSIS — N183 Chronic kidney disease, stage 3 unspecified: Secondary | ICD-10-CM

## 2020-02-16 DIAGNOSIS — G250 Essential tremor: Secondary | ICD-10-CM

## 2020-02-16 DIAGNOSIS — Z85828 Personal history of other malignant neoplasm of skin: Secondary | ICD-10-CM

## 2020-02-16 DIAGNOSIS — I4510 Unspecified right bundle-branch block: Secondary | ICD-10-CM | POA: Diagnosis not present

## 2020-02-16 DIAGNOSIS — Z8542 Personal history of malignant neoplasm of other parts of uterus: Secondary | ICD-10-CM

## 2020-02-16 DIAGNOSIS — N368 Other specified disorders of urethra: Secondary | ICD-10-CM

## 2020-02-16 DIAGNOSIS — E538 Deficiency of other specified B group vitamins: Secondary | ICD-10-CM

## 2020-02-16 MED ORDER — PROPRANOLOL HCL 40 MG PO TABS: ORAL_TABLET | ORAL | 3 refills | Status: DC

## 2020-02-16 NOTE — Patient Instructions (Signed)
If your sodium is up, you can go back up on your water and the regular diet for you.  And we will assume your symptoms are from the amlodipine if you sodium is back to normal and will stop it and start benicar 10 mg, 1/2-1 tablet at night.   If your sodium is lower I may send you to the hospital to get your sodium increased slowly.   If you sodium is the same, we will refer you to a kidney doctor.   HYPERTENSION INFORMATION  Monitor your blood pressure at home, please keep a record and bring that in with you to your next office visit.   Go to the ER if any CP, SOB, nausea, dizziness, severe HA, changes vision/speech  Testing/Procedures: HOW TO TAKE YOUR BLOOD PRESSURE:  Rest 5 minutes before taking your blood pressure.  Don't smoke or drink caffeinated beverages for at least 30 minutes before.  Take your blood pressure before (not after) you eat.  Sit comfortably with your back supported and both feet on the floor (don't cross your legs).  Elevate your arm to heart level on a table or a desk.  Use the proper sized cuff. It should fit smoothly and snugly around your bare upper arm. There should be enough room to slip a fingertip under the cuff. The bottom edge of the cuff should be 1 inch above the crease of the elbow.  Due to a recent study, SPRINT, we have changed our goal for the systolic or top blood pressure number. Ideally we want your top number at 120.  In the [facility] Trial, 5000 people were randomized to a goal BP of 120 and 5000 people were randomized to a goal BP of less than 140. The patients with the goal BP at 120 had LESS DEMENTIA, LESS HEART ATTACKS, AND LESS STROKES, AS WELL AS OVERALL DECREASED MORTALITY OR DEATH RATE.   There was another study that showed taking your blood pressure medications at night decrease cardiovascular events.  However if you are on a fluid pill, please take this in the morning.   If you are willing, our goal BP is the top number of 120.   Your most recent BP: BP: 122/76   Take your medications faithfully as instructed. Maintain a healthy weight. Get at least 150 minutes of aerobic exercise per week. Minimize salt intake. Minimize alcohol intake  DASH Eating Plan DASH stands for "Dietary Approaches to Stop Hypertension." The DASH eating plan is a healthy eating plan that has been shown to reduce high blood pressure (hypertension). Additional health benefits may include reducing the risk of type 2 diabetes mellitus, heart disease, and stroke. The DASH eating plan may also help with weight loss. WHAT DO I NEED TO KNOW ABOUT THE DASH EATING PLAN? For the DASH eating plan, you will follow these general guidelines:  Choose foods with a percent daily value for sodium of less than 5% (as listed on the food label).  Use salt-free seasonings or herbs instead of table salt or sea salt.  Check with your health care provider or pharmacist before using salt substitutes.  Eat lower-sodium products, often labeled as "lower sodium" or "no salt added."  Eat fresh foods.  Eat more vegetables, fruits, and low-fat dairy products.  Choose whole grains. Look for the word "whole" as the first word in the ingredient list.  Choose fish and skinless chicken or [place] more often than red meat. Limit fish, poultry, and meat to 6 oz (170  g) each day.  Limit sweets, desserts, sugars, and sugary drinks.  Choose heart-healthy fats.  Limit cheese to 1 oz (28 g) per day.  Eat more home-cooked food and less restaurant, buffet, and fast food.  Limit fried foods.  Cook foods using methods other than frying.  Limit canned vegetables. If you do use them, rinse them well to decrease the sodium.  When eating at a restaurant, ask that your food be prepared with less salt, or no salt if possible. WHAT FOODS CAN I EAT? Seek help from a dietitian for individual calorie needs. Grains Whole grain or whole wheat bread. Brown rice. Whole grain or  whole wheat pasta. Quinoa, bulgur, and whole grain cereals. Low-sodium cereals. Corn or whole wheat flour tortillas. Whole grain cornbread. Whole grain crackers. Low-sodium crackers. Vegetables Fresh or frozen vegetables (raw, steamed, roasted, or grilled). Low-sodium or reduced-sodium tomato and vegetable juices. Low-sodium or reduced-sodium tomato sauce and paste. Low-sodium or reduced-sodium canned vegetables.  Fruits All fresh, canned (in natural juice), or frozen fruits. Meat and Other Protein Products Ground beef (85% or leaner), grass-fed beef, or beef trimmed of fat. Skinless chicken or [place]. Ground chicken or [place]. Pork trimmed of fat. All fish and seafood. Eggs. Dried beans, peas, or lentils. Unsalted nuts and seeds. Unsalted canned beans. Dairy Low-fat dairy products, such as skim or 1% milk, 2% or reduced-fat cheeses, low-fat ricotta or cottage cheese, or plain low-fat yogurt. Low-sodium or reduced-sodium cheeses. Fats and Oils Tub margarines without trans fats. Light or reduced-fat mayonnaise and salad dressings (reduced sodium). Avocado. Safflower, olive, or canola oils. Natural peanut or almond butter. Other Unsalted popcorn and pretzels. The items listed above may not be a complete list of recommended foods or beverages. Contact your dietitian for more options. WHAT FOODS ARE NOT RECOMMENDED? Grains White bread. White pasta. White rice. Refined cornbread. Bagels and croissants. Crackers that contain trans fat. Vegetables Creamed or fried vegetables. Vegetables in a cheese sauce. Regular canned vegetables. Regular canned tomato sauce and paste. Regular tomato and vegetable juices. Fruits Dried fruits. Canned fruit in light or heavy syrup. Fruit juice. Meat and Other Protein Products Fatty cuts of meat. Ribs, chicken wings, bacon, sausage, bologna, salami, chitterlings, fatback, hot dogs, bratwurst, and packaged luncheon meats. Salted nuts and seeds. Canned beans with  salt. Dairy Whole or 2% milk, cream, half-and-half, and cream cheese. Whole-fat or sweetened yogurt. Full-fat cheeses or blue cheese. Nondairy creamers and whipped toppings. Processed cheese, cheese spreads, or cheese curds. Condiments Onion and garlic salt, seasoned salt, table salt, and sea salt. Canned and packaged gravies. Worcestershire sauce. Tartar sauce. Barbecue sauce. Teriyaki sauce. Soy sauce, including reduced sodium. Steak sauce. Fish sauce. Oyster sauce. Cocktail sauce. Horseradish. Ketchup and mustard. Meat flavorings and tenderizers. Bouillon cubes. Hot sauce. Tabasco sauce. Marinades. Taco seasonings. Relishes. Fats and Oils Butter, stick margarine, lard, shortening, ghee, and bacon fat. Coconut, palm kernel, or palm oils. Regular salad dressings. Other Pickles and olives. Salted popcorn and pretzels. The items listed above may not be a complete list of foods and beverages to avoid. Contact your dietitian for more information. WHERE CAN I FIND MORE INFORMATION? National Heart, Lung, and Blood Institute: travelstabloid.com Document Released: 11/14/2011 Document Revised: 04/11/2014 Document Reviewed: 09/29/2013 [facility] Patient Information 2015 [place], [place]. This information is not intended to replace advice given to you by your health care provider. Make sure you discuss any questions you have with your health care provider.   Hyponatremia Hyponatremia is when the amount of salt (  sodium) in your blood is too low. When sodium levels are low, your cells absorb extra water, which causes them to swell. The swelling happens throughout the body, but it mostly affects the brain. What are the causes? This condition may be caused by:  Certain medical conditions, such as: ? Heart, kidney, or liver problems. ? Thyroid problems. ? Adrenal gland problems. ? Metabolic conditions, such as Addison disease or syndrome of inappropriate antidiuretic hormone  (SIADH).  Severe vomiting or diarrhea.  Certain medicines or illegal drugs.  Dehydration.  Drinking too much water.  Eating a diet that is low in sodium.  Large burns on your body.  Excessive sweating. What increases the risk? You are more likely to develop this condition if you:  Have long-term (chronic) kidney disease.  Have heart failure.  Have a medical condition that causes frequent or excessive diarrhea.  Participate in intense physical activities, such as marathon running.  Take certain medicines that affect the sodium and fluid balance in the blood. Some of these medicine types include: ? Diuretics. ? NSAIDs. ? Some opioid pain medicines. ? Some antidepressants. ? Some seizure prevention medicines. What are the signs or symptoms? Symptoms of this condition include:  Headache.  Nausea and vomiting.  Being very tired (lethargic).  Muscle weakness and cramping.  Loss of appetite.  Feeling weak or light-headed. Severe symptoms of this condition include:  Confusion.  Agitation.  Having a rapid heart rate.  Passing out (fainting).  Seizures.  Coma. How is this diagnosed? This condition is diagnosed based on:  A physical exam.  Your medical history.  Tests, including: ? Blood tests. ? Urine tests. How is this treated? Treatment for this condition depends on the cause. Treatment may include:  Getting fluids through an IV that is inserted into one of your veins.  Medicines to correct the sodium imbalance. If medicines are causing the condition, the medicines will need to be adjusted.  Limiting your water or fluid intake to get the correct sodium balance.  Monitoring in the hospital setting to closely watch your symptoms for improvement. Follow these instructions at home:   Take over-the-counter and prescription medicines only as told by your health care provider. Many medicines can make this condition worse. Talk with your health care  provider about any medicines that you are currently taking.  Carefully follow a recommended diet as told by your health care provider.  Carefully follow instructions from your health care provider about fluid restrictions.  Do not drink alcohol.  Keep all follow-up visits as told by your health care provider. This is important. Contact a health care provider if:  You develop worsening nausea, fatigue, headache, confusion, or weakness.  Your symptoms go away and then return.  You have problems following the recommended diet. Get help right away if:  You have a seizure.  You pass out.  You have ongoing diarrhea or vomiting. Summary  Hyponatremia is when the amount of salt (sodium) in your blood is too low.  When sodium levels are low, your cells absorb extra water, which causes them to swell.  The swelling happens throughout the body, but it mostly affects the brain.  Treatment for this condition depends on the cause. It may include IV fluids, medicines, and limiting your fluid intake. This information is not intended to replace advice given to you by your health care provider. Make sure you discuss any questions you have with your health care provider. Document Revised: 10/09/2018 Document Reviewed: 10/09/2018 Elsevier Patient  Education  [employer].

## 2020-02-21 LAB — CBC WITH DIFFERENTIAL/PLATELET
Absolute Monocytes: 539 cells/uL (ref 200–950)
Basophils Absolute: 99 cells/uL (ref 0–200)
Basophils Relative: 1.6 %
Eosinophils Absolute: 248 cells/uL (ref 15–500)
Eosinophils Relative: 4 %
HCT: 44.9 % (ref 35.0–45.0)
Hemoglobin: 14.6 g/dL (ref 11.7–15.5)
Lymphs Abs: 2319 cells/uL (ref 850–3900)
MCH: 28.1 pg (ref 27.0–33.0)
MCHC: 32.5 g/dL (ref 32.0–36.0)
MCV: 86.5 fL (ref 80.0–100.0)
MPV: 9.9 fL (ref 7.5–12.5)
Monocytes Relative: 8.7 %
Neutro Abs: 2995 cells/uL (ref 1500–7800)
Neutrophils Relative %: 48.3 %
Platelets: 355 10*3/uL (ref 140–400)
RBC: 5.19 10*6/uL — ABNORMAL HIGH (ref 3.80–5.10)
RDW: 14 % (ref 11.0–15.0)
Total Lymphocyte: 37.4 %
WBC: 6.2 10*3/uL (ref 3.8–10.8)

## 2020-02-21 LAB — COMPLETE METABOLIC PANEL WITH GFR
AG Ratio: 1.9 (calc) (ref 1.0–2.5)
ALT: 12 U/L (ref 6–29)
AST: 17 U/L (ref 10–35)
Albumin: 4.2 g/dL (ref 3.6–5.1)
Alkaline phosphatase (APISO): 41 U/L (ref 37–153)
BUN/Creatinine Ratio: 16 (calc) (ref 6–22)
BUN: 19 mg/dL (ref 7–25)
CO2: 28 mmol/L (ref 20–32)
Calcium: 9.7 mg/dL (ref 8.6–10.4)
Chloride: 102 mmol/L (ref 98–110)
Creat: 1.17 mg/dL — ABNORMAL HIGH (ref 0.60–0.88)
GFR, Est African American: 49 mL/min/{1.73_m2} — ABNORMAL LOW (ref >=60)
GFR, Est Non African American: 42 mL/min/{1.73_m2} — ABNORMAL LOW (ref >=60)
Globulin: 2.2 g/dL (calc) (ref 1.9–3.7)
Glucose, Bld: 99 mg/dL (ref 65–99)
Potassium: 5.7 mmol/L — ABNORMAL HIGH (ref 3.5–5.3)
Sodium: 136 mmol/L (ref 135–146)
Total Bilirubin: 0.8 mg/dL (ref 0.2–1.2)
Total Protein: 6.4 g/dL (ref 6.1–8.1)

## 2020-02-21 LAB — MAGNESIUM: Magnesium: 2.2 mg/dL (ref 1.5–2.5)

## 2020-02-21 LAB — SEROTONIN SERUM: Serotonin, Serum: 104 ng/mL (ref 56–244)

## 2020-02-21 LAB — TSH: TSH: 1.81 mIU/L (ref 0.40–4.50)

## 2020-02-22 ENCOUNTER — Telehealth: Payer: Self-pay | Admitting: Physician Assistant

## 2020-02-22 MED ORDER — LISINOPRIL 5 MG PO TABS: ORAL_TABLET | ORAL | 0 refills | Status: DC

## 2020-02-22 NOTE — Telephone Encounter (Signed)
Having swelling with amlodipine and still tried,  Will start on low dose lisinopril 5 mg 1-2 at night depending on blood pressure.  Follow up nurse visit 1 week to check kidney function and BP

## 2020-02-29 ENCOUNTER — Other Ambulatory Visit: Payer: Self-pay

## 2020-02-29 ENCOUNTER — Ambulatory Visit (INDEPENDENT_AMBULATORY_CARE_PROVIDER_SITE_OTHER): Payer: Medicare PPO

## 2020-02-29 VITALS — BP 120/86 | HR 66 | Temp 97.5°F | Wt 154.0 lb

## 2020-02-29 DIAGNOSIS — Z79899 Other long term (current) drug therapy: Secondary | ICD-10-CM

## 2020-02-29 NOTE — Progress Notes (Signed)
Patient did state that this AM--heart racing stopped after taking MEDS

## 2020-03-08 ENCOUNTER — Other Ambulatory Visit: Payer: Self-pay

## 2020-03-08 ENCOUNTER — Ambulatory Visit (INDEPENDENT_AMBULATORY_CARE_PROVIDER_SITE_OTHER): Payer: Medicare PPO

## 2020-03-08 VITALS — BP 126/82 | HR 62 | Temp 97.5°F | Wt 155.4 lb

## 2020-03-08 DIAGNOSIS — Z79899 Other long term (current) drug therapy: Secondary | ICD-10-CM | POA: Diagnosis not present

## 2020-03-08 NOTE — Progress Notes (Signed)
Patient presents to the office for a nurse visit to have labs checked. Patient states that with taking the Lisinopril, a cough has developed. Informed Estill Bamberg and instructed the patient to stop taking and she will send in something different. Vitals checked and recorded.

## 2020-03-09 ENCOUNTER — Encounter: Payer: Self-pay | Admitting: Physician Assistant

## 2020-03-09 ENCOUNTER — Other Ambulatory Visit: Payer: Self-pay | Admitting: Physician Assistant

## 2020-03-09 LAB — COMPLETE METABOLIC PANEL WITH GFR
AG Ratio: 2.3 (calc) (ref 1.0–2.5)
ALT: 21 U/L (ref 6–29)
AST: 18 U/L (ref 10–35)
Albumin: 4.3 g/dL (ref 3.6–5.1)
Alkaline phosphatase (APISO): 42 U/L (ref 37–153)
BUN/Creatinine Ratio: 13 (calc) (ref 6–22)
BUN: 15 mg/dL (ref 7–25)
CO2: 27 mmol/L (ref 20–32)
Calcium: 9.8 mg/dL (ref 8.6–10.4)
Chloride: 104 mmol/L (ref 98–110)
Creat: 1.19 mg/dL — ABNORMAL HIGH (ref 0.60–0.88)
GFR, Est African American: 48 mL/min/{1.73_m2} — ABNORMAL LOW (ref >=60)
GFR, Est Non African American: 41 mL/min/{1.73_m2} — ABNORMAL LOW (ref >=60)
Globulin: 1.9 g/dL (calc) (ref 1.9–3.7)
Glucose, Bld: 89 mg/dL (ref 65–99)
Potassium: 4.9 mmol/L (ref 3.5–5.3)
Sodium: 138 mmol/L (ref 135–146)
Total Bilirubin: 0.7 mg/dL (ref 0.2–1.2)
Total Protein: 6.2 g/dL (ref 6.1–8.1)

## 2020-03-09 MED ORDER — OLMESARTAN MEDOXOMIL 20 MG PO TABS: ORAL_TABLET | ORAL | 2 refills | Status: DC

## 2020-03-16 ENCOUNTER — Ambulatory Visit: Payer: Medicare Other | Admitting: Physician Assistant

## 2020-03-21 NOTE — Progress Notes (Signed)
Assessment and Plan: Monica Curry was seen today for follow-up.  Diagnoses and all orders for this visit:  Essential hypertension -     CBC with Differential/Platelet -     COMPLETE METABOLIC PANEL WITH GFR -     TSH Can do 1/2 or the 10 mg at night with dinner.  Can do 5 mg or 1/4 of in the morning.   Can refer you back to cardiology if not better.   Stage 3 chronic kidney disease, unspecified whether stage 3a or 3b CKD -     COMPLETE METABOLIC PANEL WITH GFR Monitor, avoid diuretics  Hyponatremia Resolved  Medication management -     Magnesium  Mixed hyperlipidemia -     Lipid panel     Future Appointments  Date Time Provider [place]  03/27/2020 10:30 AM [name], NP [facility] None  03/27/2020  2:00 PM Vicie Mutters, PA-C [facility] None  06/29/2020  2:00 PM [name], NP [facility] None  02/28/2021 10:00 AM Vicie Mutters, PA-C [facility] None    HPI 86 y.o.female presents for follow up for HTN, diuretic induced hyponatremia.   She had lisinopril with HCTZ added which resulted in a low sodium, her sodium and kidney function has returned to normal. She was unable to tolerate norvasc due to fatigue, flushing, SOB. She had cough with ACE . She states it took a week for her swelling to improve once off norvasc but it has improved.    She had normal stress test 04/2018, event monitor 01/2018.   Started on low dose benicar WITHOUT a fluid pill. Has been taking 1/2 of the tablet of the benicar 20 mg twice a day. Here for follow up.   BP Readings from Last 3 Encounters:  03/23/20 124/80  03/08/20 126/82  02/29/20 120/86   BMI is Body mass index is 26.26 kg/m., she is working on diet and exercise. Wt Readings from Last 3 Encounters:  03/23/20 153 lb (69.4 kg)  03/08/20 155 lb 6.4 oz (70.5 kg)  02/29/20 154 lb (69.9 kg)   Her blood pressure has been controlled at home, better at home but has been low occ at night with taking benicar and the  propranolol, today [name] is BP: 124/80  She does not workout. She denies chest pain, shortness of breath, dizziness.  She is on cholesterol medication and denies myalgias. Her cholesterol is at goal. The cholesterol last visit was:   Lab Results  Component Value Date   CHOL 112 12/27/2019   HDL 49 (L) 12/27/2019   LDLCALC 46 12/27/2019   TRIG 86 12/27/2019   CHOLHDL 2.3 12/27/2019    Last A1C in the office was:  Lab Results  Component Value Date   HGBA1C 5.4 09/21/2019   Patient is on Vitamin D supplement.   Lab Results  Component Value Date   VD25OH 80 09/21/2019       Patient Active Problem List   Diagnosis Date Noted  . Paroxysmal supraventricular tachycardia ([place]) 10/20/2019  . Primary restrictive cardiomyopathy ([place]) 10/20/2019  . History of skin cancer (basal cell) 06/16/2019  . Complete right bundle branch block 06/16/2019  . B12 deficiency 06/15/2019  . History of TIA (transient ischemic attack) 10/23/2017  . Chronic kidney disease, stage III (moderate) 07/21/2015  . Medication management 07/04/2014  . Essential hypertension 11/25/2013  . Mixed hyperlipidemia 11/25/2013  . Other abnormal glucose 11/25/2013  . Vitamin D deficiency 11/25/2013  . Essential tremor 11/25/2013  . History of endometrial cancer   .  Hx/o DVT/PE   . Urethral prolapse   . Osteoporosis 10/10/2011      Current Outpatient Medications (Cardiovascular):  .  atorvastatin (LIPITOR) 10 MG tablet, Take 1 tablet Daily for Cholesterol .  olmesartan (BENICAR) 20 MG tablet, 1/2-1 tablet daily for blood pressure .  propranolol (INDERAL) 40 MG tablet, TAKE 1 TABLET BY MOUTH FOUR TIMES DAILY WITH MEALS AND BEDTIME FOR BLOOD PRESSURE AND TREMOR   Current Outpatient Medications (Analgesics):  .  aspirin 325 MG tablet, Take 325 mg by mouth daily.  Current Outpatient Medications (Hematological):  .  vitamin B-12 (CYANOCOBALAMIN) 500 MCG tablet, Take 500 mcg by mouth daily. Sublingual  Current  Outpatient Medications (Other):  Marland Kitchen  Calcium-Magnesium-Vitamin D 300-20-200 MG-MG-UNIT CHEW, Chew 1 tablet daily by mouth.  .  Cholecalciferol (VITAMIN D3) 5000 units TABS, Take 2 tablets by mouth daily.  .  clonazePAM (KLONOPIN) 0.5 MG tablet, TAKE 1 TABLET BY MOUTH EVERY MORNING,1/2 TABLET AT NOON AND 1/2 AT NIGHT .  Magnesium 250 MG TABS, Take 250 mg by mouth 2 (two) times a day.  Allergies  Allergen Reactions  . Codeine Hives  . Hctz [Hydrochlorothiazide] Other (See Comments)    Low sodium  . Meloxicam Nausea Only    Gastritis and nausea  . Mysoline [Primidone]   . Pneumovax 23 [Pneumococcal Vac Polyvalent]     Red, raised area.  Knot at site of injection  . Toradol [Ketorolac Tromethamine] Swelling    Localized redness at injection site.    . Ace Inhibitors Cough    ROS: all negative except above.   Physical Exam: Filed Weights   03/23/20 1041  Weight: 153 lb (69.4 kg)   BP 124/80   Pulse 63   Temp (!) 97.3 F (36.3 C)   Wt 153 lb (69.4 kg)   SpO2 98%   BMI 26.26 kg/m  General Appearance: Well nourished, in no apparent distress. Eyes: PERRLA, EOMs, conjunctiva no swelling or erythema Sinuses: No Frontal/maxillary tenderness ENT/Mouth: Ext aud canals clear, TMs without erythema, bulging. No erythema, swelling, or exudate on post pharynx.  Tonsils not swollen or erythematous. Hearing normal.  Neck: Supple, thyroid normal.  Respiratory: Respiratory effort normal, BS equal bilaterally without rales, rhonchi, wheezing or stridor.  Cardio: RRR with no MRGs. Brisk peripheral pulses without edema.  Abdomen: Soft, + BS.  Non tender, no guarding, rebound, hernias, masses. Lymphatics: Non tender without lymphadenopathy.  Musculoskeletal: Full ROM, 5/5 strength, normal gait.  Skin: Warm, dry without rashes, lesions, ecchymosis.  Neuro: Cranial nerves intact. Normal muscle tone, no cerebellar symptoms. Sensation intact.  Psych: Awake and oriented X 3, normal affect, Insight and  Judgment appropriate.     Vicie Mutters, PA-C 10:43 AM [facility]

## 2020-03-23 ENCOUNTER — Ambulatory Visit: Payer: Medicare PPO | Admitting: Physician Assistant

## 2020-03-23 ENCOUNTER — Other Ambulatory Visit: Payer: Self-pay

## 2020-03-23 ENCOUNTER — Encounter: Payer: Self-pay | Admitting: Physician Assistant

## 2020-03-23 VITALS — BP 124/80 | HR 63 | Temp 97.3°F | Wt 153.0 lb

## 2020-03-23 DIAGNOSIS — E871 Hypo-osmolality and hyponatremia: Secondary | ICD-10-CM

## 2020-03-23 DIAGNOSIS — E782 Mixed hyperlipidemia: Secondary | ICD-10-CM | POA: Diagnosis not present

## 2020-03-23 DIAGNOSIS — Z79899 Other long term (current) drug therapy: Secondary | ICD-10-CM

## 2020-03-23 DIAGNOSIS — N183 Chronic kidney disease, stage 3 unspecified: Secondary | ICD-10-CM

## 2020-03-23 DIAGNOSIS — I10 Essential (primary) hypertension: Secondary | ICD-10-CM

## 2020-03-23 NOTE — Patient Instructions (Addendum)
Can do 1/2 or the 10 mg at night with dinner.  Can do 5 mg or 1/4 of in the morning.   Can refer you back to cardiology if not better.   HYPERTENSION INFORMATION  Monitor your blood pressure at home, please keep a record and bring that in with you to your next office visit.   Go to the ER if any CP, SOB, nausea, dizziness, severe HA, changes vision/speech  Testing/Procedures: HOW TO TAKE YOUR BLOOD PRESSURE:  Rest 5 minutes before taking your blood pressure.  Don't smoke or drink caffeinated beverages for at least 30 minutes before.  Take your blood pressure before (not after) you eat.  Sit comfortably with your back supported and both feet on the floor (don't cross your legs).  Elevate your arm to heart level on a table or a desk.  Use the proper sized cuff. It should fit smoothly and snugly around your bare upper arm. There should be enough room to slip a fingertip under the cuff. The bottom edge of the cuff should be 1 inch above the crease of the elbow.  Due to a recent study, SPRINT, we have changed our goal for the systolic or top blood pressure number. Ideally we want your top number at 120.  In the [facility] Trial, 5000 people were randomized to a goal BP of 120 and 5000 people were randomized to a goal BP of less than 140. The patients with the goal BP at 120 had LESS DEMENTIA, LESS HEART ATTACKS, AND LESS STROKES, AS WELL AS OVERALL DECREASED MORTALITY OR DEATH RATE.   There was another study that showed taking your blood pressure medications at night decrease cardiovascular events.  However if you are on a fluid pill, please take this in the morning.   If you are willing, our goal BP is the top number of 120.  Your most recent BP: BP: 124/80   Take your medications faithfully as instructed. Maintain a healthy weight. Get at least 150 minutes of aerobic exercise per week. Minimize salt intake. Minimize alcohol intake  DASH Eating Plan DASH stands for "Dietary  Approaches to Stop Hypertension." The DASH eating plan is a healthy eating plan that has been shown to reduce high blood pressure (hypertension). Additional health benefits may include reducing the risk of type 2 diabetes mellitus, heart disease, and stroke. The DASH eating plan may also help with weight loss. WHAT DO I NEED TO KNOW ABOUT THE DASH EATING PLAN? For the DASH eating plan, you will follow these general guidelines:  Choose foods with a percent daily value for sodium of less than 5% (as listed on the food label).  Use salt-free seasonings or herbs instead of table salt or sea salt.  Check with your health care provider or pharmacist before using salt substitutes.  Eat lower-sodium products, often labeled as "lower sodium" or "no salt added."  Eat fresh foods.  Eat more vegetables, fruits, and low-fat dairy products.  Choose whole grains. Look for the word "whole" as the first word in the ingredient list.  Choose fish and skinless chicken or [place] more often than red meat. Limit fish, poultry, and meat to 6 oz (170 g) each day.  Limit sweets, desserts, sugars, and sugary drinks.  Choose heart-healthy fats.  Limit cheese to 1 oz (28 g) per day.  Eat more home-cooked food and less restaurant, buffet, and fast food.  Limit fried foods.  Cook foods using methods other than frying.  Limit canned vegetables.  If you do use them, rinse them well to decrease the sodium.  When eating at a restaurant, ask that your food be prepared with less salt, or no salt if possible. WHAT FOODS CAN I EAT? Seek help from a dietitian for individual calorie needs. Grains Whole grain or whole wheat bread. Brown rice. Whole grain or whole wheat pasta. Quinoa, bulgur, and whole grain cereals. Low-sodium cereals. Corn or whole wheat flour tortillas. Whole grain cornbread. Whole grain crackers. Low-sodium crackers. Vegetables Fresh or frozen vegetables (raw, steamed, roasted, or grilled).  Low-sodium or reduced-sodium tomato and vegetable juices. Low-sodium or reduced-sodium tomato sauce and paste. Low-sodium or reduced-sodium canned vegetables.  Fruits All fresh, canned (in natural juice), or frozen fruits. Meat and Other Protein Products Ground beef (85% or leaner), grass-fed beef, or beef trimmed of fat. Skinless chicken or [place]. Ground chicken or [place]. Pork trimmed of fat. All fish and seafood. Eggs. Dried beans, peas, or lentils. Unsalted nuts and seeds. Unsalted canned beans. Dairy Low-fat dairy products, such as skim or 1% milk, 2% or reduced-fat cheeses, low-fat ricotta or cottage cheese, or plain low-fat yogurt. Low-sodium or reduced-sodium cheeses. Fats and Oils Tub margarines without trans fats. Light or reduced-fat mayonnaise and salad dressings (reduced sodium). Avocado. Safflower, olive, or canola oils. Natural peanut or almond butter. Other Unsalted popcorn and pretzels. The items listed above may not be a complete list of recommended foods or beverages. Contact your dietitian for more options. WHAT FOODS ARE NOT RECOMMENDED? Grains White bread. White pasta. White rice. Refined cornbread. Bagels and croissants. Crackers that contain trans fat. Vegetables Creamed or fried vegetables. Vegetables in a cheese sauce. Regular canned vegetables. Regular canned tomato sauce and paste. Regular tomato and vegetable juices. Fruits Dried fruits. Canned fruit in light or heavy syrup. Fruit juice. Meat and Other Protein Products Fatty cuts of meat. Ribs, chicken wings, bacon, sausage, bologna, salami, chitterlings, fatback, hot dogs, bratwurst, and packaged luncheon meats. Salted nuts and seeds. Canned beans with salt. Dairy Whole or 2% milk, cream, half-and-half, and cream cheese. Whole-fat or sweetened yogurt. Full-fat cheeses or blue cheese. Nondairy creamers and whipped toppings. Processed cheese, cheese spreads, or cheese curds. Condiments Onion and garlic salt,  seasoned salt, table salt, and sea salt. Canned and packaged gravies. Worcestershire sauce. Tartar sauce. Barbecue sauce. Teriyaki sauce. Soy sauce, including reduced sodium. Steak sauce. Fish sauce. Oyster sauce. Cocktail sauce. Horseradish. Ketchup and mustard. Meat flavorings and tenderizers. Bouillon cubes. Hot sauce. Tabasco sauce. Marinades. Taco seasonings. Relishes. Fats and Oils Butter, stick margarine, lard, shortening, ghee, and bacon fat. Coconut, palm kernel, or palm oils. Regular salad dressings. Other Pickles and olives. Salted popcorn and pretzels. The items listed above may not be a complete list of foods and beverages to avoid. Contact your dietitian for more information. WHERE CAN I FIND MORE INFORMATION? National Heart, Lung, and [employer] Institute: travelstabloid.com Document Released: 11/14/2011 Document Revised: 04/11/2014 Document Reviewed: 09/29/2013 [facility] Patient Information 2015 [place], [place]. This information is not intended to replace advice given to you by your health care provider. Make sure you discuss any questions you have with your health care provider.

## 2020-03-24 LAB — CBC WITH DIFFERENTIAL/PLATELET
Absolute Monocytes: 530 cells/uL (ref 200–950)
Basophils Absolute: 71 cells/uL (ref 0–200)
Basophils Relative: 1.4 %
Eosinophils Absolute: 240 cells/uL (ref 15–500)
Eosinophils Relative: 4.7 %
HCT: 44.3 % (ref 35.0–45.0)
Hemoglobin: 14.5 g/dL (ref 11.7–15.5)
Lymphs Abs: 2030 cells/uL (ref 850–3900)
MCH: 28.3 pg (ref 27.0–33.0)
MCHC: 32.7 g/dL (ref 32.0–36.0)
MCV: 86.4 fL (ref 80.0–100.0)
MPV: 10.3 fL (ref 7.5–12.5)
Monocytes Relative: 10.4 %
Neutro Abs: 2229 cells/uL (ref 1500–7800)
Neutrophils Relative %: 43.7 %
Platelets: 308 10*3/uL (ref 140–400)
RBC: 5.13 10*6/uL — ABNORMAL HIGH (ref 3.80–5.10)
RDW: 14.2 % (ref 11.0–15.0)
Total Lymphocyte: 39.8 %
WBC: 5.1 10*3/uL (ref 3.8–10.8)

## 2020-03-24 LAB — COMPLETE METABOLIC PANEL WITH GFR
AG Ratio: 2.1 (calc) (ref 1.0–2.5)
ALT: 9 U/L (ref 6–29)
AST: 13 U/L (ref 10–35)
Albumin: 4.1 g/dL (ref 3.6–5.1)
Alkaline phosphatase (APISO): 39 U/L (ref 37–153)
BUN/Creatinine Ratio: 14 (calc) (ref 6–22)
BUN: 18 mg/dL (ref 7–25)
CO2: 27 mmol/L (ref 20–32)
Calcium: 9.2 mg/dL (ref 8.6–10.4)
Chloride: 101 mmol/L (ref 98–110)
Creat: 1.26 mg/dL — ABNORMAL HIGH (ref 0.60–0.88)
GFR, Est African American: 45 mL/min/{1.73_m2} — ABNORMAL LOW (ref >=60)
GFR, Est Non African American: 39 mL/min/{1.73_m2} — ABNORMAL LOW (ref >=60)
Globulin: 2 g/dL (calc) (ref 1.9–3.7)
Glucose, Bld: 100 mg/dL — ABNORMAL HIGH (ref 65–99)
Potassium: 4.7 mmol/L (ref 3.5–5.3)
Sodium: 135 mmol/L (ref 135–146)
Total Bilirubin: 0.7 mg/dL (ref 0.2–1.2)
Total Protein: 6.1 g/dL (ref 6.1–8.1)

## 2020-03-24 LAB — MAGNESIUM: Magnesium: 2.1 mg/dL (ref 1.5–2.5)

## 2020-03-24 LAB — LIPID PANEL
Cholesterol: 111 mg/dL (ref <200)
HDL: 45 mg/dL — ABNORMAL LOW (ref >=50)
LDL Cholesterol (Calc): 49 mg/dL (calc)
Non-HDL Cholesterol (Calc): 66 mg/dL (calc) (ref <130)
Total CHOL/HDL Ratio: 2.5 (calc) (ref <5.0)
Triglycerides: 91 mg/dL (ref <150)

## 2020-03-24 LAB — TSH: TSH: 1.44 mIU/L (ref 0.40–4.50)

## 2020-03-27 ENCOUNTER — Ambulatory Visit: Payer: Medicare PPO | Admitting: Adult Health

## 2020-03-27 ENCOUNTER — Encounter: Payer: Self-pay | Admitting: Adult Health

## 2020-03-27 ENCOUNTER — Other Ambulatory Visit: Payer: Self-pay

## 2020-03-27 ENCOUNTER — Ambulatory Visit: Payer: Medicare Other | Admitting: Physician Assistant

## 2020-03-27 VITALS — BP 140/78 | HR 62 | Temp 97.2°F | Ht 63.0 in | Wt 150.0 lb

## 2020-03-27 DIAGNOSIS — G250 Essential tremor: Secondary | ICD-10-CM | POA: Diagnosis not present

## 2020-03-27 MED ORDER — TOPIRAMATE 25 MG PO TABS: 25.0000 mg | ORAL_TABLET | Freq: Every day | ORAL | 5 refills | Status: DC

## 2020-03-27 NOTE — Patient Instructions (Signed)
Your Plan:  Continue klonopin Start topamax 25 mg at bedtime If your symptoms worsen or you develop new symptoms please let us know.    Thank you for coming to see [place] at [facility]. I hope we have been able to provide you high quality care today.  You may receive a patient satisfaction survey over the next few weeks. We would appreciate your feedback and comments so that we may continue to improve ourselves and the health of our patients.  Topiramate tablets What is this medicine? TOPIRAMATE (toe PYRE a mate) is used to treat seizures in adults or children with epilepsy. It is also used for the prevention of migraine headaches. This medicine may be used for other purposes; ask your health care provider or pharmacist if you have questions. COMMON BRAND NAME(S): Topamax, Topiragen What should I tell my health care provider before I take this medicine? They need to know if you have any of these conditions:  bleeding disorders  kidney disease  lung or breathing disease, like asthma  suicidal thoughts, plans, or attempt; a previous suicide attempt by you or a family member  an unusual or allergic reaction to topiramate, other medicines, foods, dyes, or preservatives  pregnant or trying to get pregnant  breast-feeding How should I use this medicine? Take this medicine by mouth with a glass of water. Follow the directions on the prescription label. Do not cut, crush or chew this medicine. Swallow the tablets whole. You can take it with or without food. If it upsets your stomach, take it with food. Take your medicine at regular intervals. Do not take it more often than directed. Do not stop taking except on your doctor's advice. A special MedGuide will be given to you by the pharmacist with each prescription and refill. Be sure to read this information carefully each time. Talk to your pediatrician regarding the use of this medicine in children. While this drug may be  prescribed for children as young as 37 years of age for selected conditions, precautions do apply. Overdosage: If you think you have taken too much of this medicine contact a poison control center or emergency room at once. NOTE: This medicine is only for you. Do not share this medicine with others. What if I miss a dose? If you miss a dose, take it as soon as you can. If your next dose is to be taken in less than 6 hours, then do not take the missed dose. Take the next dose at your regular time. Do not take double or extra doses. What may interact with this medicine? This medicine may interact with the following medications:  acetazolamide  alcohol  antihistamines for allergy, cough, and cold  aspirin and aspirin-like medicines  atropine  birth control pills  certain medicines for anxiety or sleep  certain medicines for bladder problems like oxybutynin, tolterodine  certain medicines for depression like amitriptyline, fluoxetine, sertraline  certain medicines for seizures like carbamazepine, phenobarbital, phenytoin, primidone, valproic acid, zonisamide  certain medicines for stomach problems like dicyclomine, hyoscyamine  certain medicines for travel sickness like scopolamine  certain medicines for Parkinson's disease like benztropine, trihexyphenidyl  certain medicines that treat or prevent blood clots like warfarin, enoxaparin, dalteparin, apixaban, dabigatran, and rivaroxaban  digoxin  general anesthetics like halothane, isoflurane, methoxyflurane, propofol  hydrochlorothiazide  ipratropium  lithium  medicines that relax muscles for surgery  metformin  narcotic medicines for pain  NSAIDs, medicines for pain and inflammation, like ibuprofen or naproxen  phenothiazines like chlorpromazine, mesoridazine, prochlorperazine, thioridazine  pioglitazone This list may not describe all possible interactions. Give your health care provider a list of all the  medicines, herbs, non-prescription drugs, or dietary supplements you use. Also tell them if you smoke, drink alcohol, or use illegal drugs. Some items may interact with your medicine. What should I watch for while using this medicine? Visit your doctor or health care professional for regular checks on your progress. Tell your health care professional if your symptoms do not start to get better or if they get worse. Do not stop taking except on your health care professional's advice. You may develop a severe reaction. Your health care professional will tell you how much medicine to take. Wear a medical ID bracelet or chain. Carry a card that describes your disease and details of your medicine and dosage times. This medicine can reduce the response of your body to heat or cold. Dress warm in cold weather and stay hydrated in hot weather. If possible, avoid extreme temperatures like saunas, hot tubs, very hot or cold showers, or activities that can cause dehydration such as vigorous exercise. Check with your health care professional if you have severe diarrhea, nausea, and vomiting, or if you sweat a lot. The loss of too much body fluid may make it dangerous for you to take this medicine. You may get drowsy or dizzy. Do not drive, use machinery, or do anything that needs mental alertness until you know how this medicine affects you. Do not stand up or sit up quickly, especially if you are an older patient. This reduces the risk of dizzy or fainting spells. Alcohol may interfere with the effect of this medicine. Avoid alcoholic drinks. Tell your health care professional right away if you have any change in your eyesight. Patients and their families should watch out for new or worsening depression or thoughts of suicide. Also watch out for sudden changes in feelings such as feeling anxious, agitated, panicky, irritable, hostile, aggressive, impulsive, severely restless, overly excited and hyperactive, or not  being able to sleep. If this happens, especially at the beginning of treatment or after a change in dose, call your healthcare professional. This medicine may cause serious skin reactions. They can happen weeks to months after starting the medicine. Contact your health care provider right away if you notice fevers or flu-like symptoms with a rash. The rash may be red or purple and then turn into blisters or peeling of the skin. Or, you might notice a red rash with swelling of the face, lips or lymph nodes in your neck or under your arms. Birth control may not work properly while you are taking this medicine. Talk to your health care professional about using an extra method of birth control. Women should inform their health care professional if they wish to become pregnant or think they might be pregnant. There is a potential for serious side effects and harm to an unborn child. Talk to your health care professional for more information. What side effects may I notice from receiving this medicine? Side effects that you should report to your doctor or health care professional as soon as possible:  allergic reactions like skin rash, itching or hives, swelling of the face, lips, or tongue  blood in the urine  changes in vision  confusion  loss of memory  pain in lower back or side  pain when urinating  redness, blistering, peeling or loosening of the skin, including inside the mouth  signs and symptoms of bleeding such as bloody or black, tarry stools; red or dark brown urine; spitting up blood or brown material that looks like coffee grounds; red spots on the skin; unusual bruising or bleeding from the eyes, gums, or nose  signs and symptoms of increased acid in the body like breathing fast; fast heartbeat; headache; confusion; unusually weak or tired; nausea, vomiting  suicidal thoughts, mood changes  trouble speaking or understanding  unusual sweating  unusually weak or tired Side  effects that usually do not require medical attention (report to your doctor or health care professional if they continue or are bothersome):  dizziness  drowsiness  fever  loss of appetite  nausea, vomiting  pain, tingling, numbness in the hands or feet  stomach pain  tiredness  upset stomach This list may not describe all possible side effects. Call your doctor for medical advice about side effects. You may report side effects to FDA at 1-800-FDA-1088. Where should I keep my medicine? Keep out of the reach of children. Store at room temperature between 15 and 30 degrees C (59 and 86 degrees F). Throw away any unused medicine after the expiration date. NOTE: This sheet is a summary. It may not cover all possible information. If you have questions about this medicine, talk to your doctor, pharmacist, or health care provider.  2020 [employer]/Gold Standard (2019-06-24 15:07:20)

## 2020-03-27 NOTE — Progress Notes (Addendum)
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  She returns today for follow-up.  She continues on clonazepam and propranolol.  She feels that her tremor has worsened.  She states that she has to hold a glass with 2 hands.  Her handwriting has gotten worse.  She did try to get a weighted glove but felt that it offered her no benefit.  She is able to complete ADLs without help.  She returns today for an evaluation.  HISTORY 07/21/19:  Monica Curry is an 86 year old female with a history of essential tremor.  She returns today for follow-up.  She remains on clonazepam for her tremor.  She reports that this is beneficial.  She did see Dr. Carles Collet for evaluation of DBS however the patient is not interested.  She states that with the pandemic in her staying at home she does not have to worry about the social aspect of the tremor.  She states that when she is in social situations she has high anxiety due to fear of people noticing her tremor.  In turn this makes her tremor worse.  She states that she notices that when she is holding a glass or feeding herself the tremor is there.  She has looked into getting the glove recommended by Dr. Carles Collet however it was expensive and she has deferred for now.  She mentions that she saw her PCP and they suggested Topamax or gabapentin for her tremor.  She returns today for an evaluation.   REVIEW OF SYSTEMS: Out of a complete 14 system review of symptoms, the patient complains only of the following symptoms, and all other reviewed systems are negative.  See HPI  ALLERGIES: Allergies  Allergen Reactions  . Codeine Hives  . Hctz [Hydrochlorothiazide] Other (See Comments)    Low sodium  . Meloxicam Nausea Only    Gastritis and nausea  . Mysoline [Primidone]   . Pneumovax 23 [Pneumococcal Vac  Polyvalent]     Red, raised area.  Knot at site of injection  . Toradol [Ketorolac Tromethamine] Swelling    Localized redness at injection site.    Humberto Leep Inhibitors Cough    HOME MEDICATIONS: Outpatient Medications Prior to Visit  Medication Sig Dispense Refill  . aspirin 325 MG tablet Take 325 mg by mouth daily.    Marland Kitchen atorvastatin (LIPITOR) 10 MG tablet Take 1 tablet Daily for Cholesterol 90 tablet 1  . Calcium-Magnesium-Vitamin D 300-20-200 MG-MG-UNIT CHEW Chew 1 tablet daily by mouth.     . Cholecalciferol (VITAMIN D3) 5000 units TABS Take 2 tablets by mouth daily.     . clonazePAM (KLONOPIN) 0.5 MG tablet TAKE 1 TABLET BY MOUTH EVERY MORNING,1/2 TABLET AT NOON AND 1/2 AT NIGHT 60 tablet 3  . Magnesium 250 MG TABS Take 250 mg by mouth 2 (two) times a day.    . olmesartan (BENICAR) 20 MG tablet 1/2-1 tablet daily for blood pressure 30 tablet 2  . propranolol (INDERAL) 40 MG tablet TAKE 1 TABLET BY MOUTH FOUR TIMES DAILY WITH MEALS AND BEDTIME FOR BLOOD PRESSURE AND TREMOR 360 tablet 3  . vitamin B-12 (CYANOCOBALAMIN) 500 MCG tablet Take 500 mcg by mouth daily. Sublingual     No facility-administered medications prior to visit.    PAST MEDICAL HISTORY: Past Medical History:  Diagnosis Date  . C. difficile colitis   .  Cancer [facility])    Endometrial cancer  . Essential and other specified forms of tremor   . Osteoporosis 11/2019   T score -2.8 stable from prior DEXA  . Personal history of venous thrombosis and embolism 1997  . Shingles   . Stroke ([place])   . Thrush   . TIA (transient ischemic attack)     PAST SURGICAL HISTORY: Past Surgical History:  Procedure Laterality Date  . APPENDECTOMY    . [place]  . TONSILLECTOMY AND ADENOIDECTOMY    . TOTAL ABDOMINAL HYSTERECTOMY W/ BILATERAL SALPINGOOPHORECTOMY  1997    FAMILY HISTORY: Family History  Problem Relation Age of Onset  . Cancer Mother        Unknown type  . Dementia Mother   . Cancer Father         Bone  . Heart disease Brother   . Prostate cancer Brother        Prostate  . Heart disease Brother   . Kidney failure Brother   . Prostate cancer Brother        Prostate  . Heart disease Sister   . Blindness Sister        temporal arteritis  . Skin cancer Sister        basal and melanoma  . Heart disease Sister   . Kidney failure Sister        Kidney transplant  . Diabetes Sister   . Hypertension Sister   . Heart disease Brother     SOCIAL HISTORY: Social History   Socioeconomic History  . Marital status: Widowed    Spouse name: Not on file  . Number of children: 1  . Years of education: Not on file  . Highest education level: Not on file  Occupational History  . Not on file  Tobacco Use  . Smoking status: Never Smoker  . Smokeless tobacco: Never Used  Substance and Sexual Activity  . Alcohol use: Yes    Alcohol/week: 0.0 standard drinks    Comment: ocassionally  . Drug use: No  . Sexual activity: Not Currently    Birth control/protection: Post-menopausal, Surgical    Comment: HYST-1st intercourse 86 yo-Fewer than 5 partners  Other Topics Concern  . Not on file  Social History Narrative  . Not on file   Social Determinants of Health   Financial Resource Strain:   . Difficulty of Paying Living Expenses:   Food Insecurity:   . Worried About Charity fundraiser in the Last Year:   . Arboriculturist in the Last Year:   Transportation Needs:   . Film/video editor (Medical):   Marland Kitchen Lack of Transportation (Non-Medical):   Physical Activity:   . Days of Exercise per Week:   . Minutes of Exercise per Session:   Stress:   . Feeling of Stress :   Social Connections:   . Frequency of Communication with Friends and Family:   . Frequency of Social Gatherings with Friends and Family:   . Attends Religious Services:   . Active Member of Clubs or Organizations:   . Attends Archivist Meetings:   Marland Kitchen Marital Status:   Intimate Partner Violence:   . Fear  of Current or Ex-Partner:   . Emotionally Abused:   Marland Kitchen Physically Abused:   . Sexually Abused:       PHYSICAL EXAM  Vitals:   03/27/20 1024  BP: 140/78  Pulse: 62  Temp: (!) 97.2 F (36.2  C)  Weight: 150 lb (68 kg)  Height: 5\' 3"  (1.6 m)   Body mass index is 26.57 kg/m.  Generalized: Well developed, in no acute distress   Neurological examination  Mentation: Alert oriented to time, place, history taking. Follows all commands speech and language fluent Cranial nerve II-XII: Pupils were equal round reactive to light. Extraocular movements were full, visual field were full on confrontational test.  Head turning and shoulder shrug  were normal and symmetric. Motor: The motor testing reveals 5 over 5 strength of all 4 extremities. Good symmetric motor tone is noted throughout.  Sensory: Sensory testing is intact to soft touch on all 4 extremities. No evidence of extinction is noted.  Coordination: Cerebellar testing reveals good finger-nose-finger and heel-to-shin bilaterally.  Significant tremor noted in the right upper extremity.  Moderate tremor noted in the left upper extremity both with finger-nose-finger Gait and station: Gait is normal. Tandem gait is normal. Romberg is negative. No drift is seen.  Reflexes: Deep tendon reflexes are symmetric and normal bilaterally.   DIAGNOSTIC DATA (LABS, IMAGING, TESTING) - I reviewed patient records, labs, notes, testing and imaging myself where available.  Lab Results  Component Value Date   WBC 5.1 03/23/2020   HGB 14.5 03/23/2020   HCT 44.3 03/23/2020   MCV 86.4 03/23/2020   PLT 308 03/23/2020      Component Value Date/Time   NA 135 03/23/2020 1122   K 4.7 03/23/2020 1122   CL 101 03/23/2020 1122   CO2 27 03/23/2020 1122   GLUCOSE 100 (H) 03/23/2020 1122   BUN 18 03/23/2020 1122   CREATININE 1.26 (H) 03/23/2020 1122   CALCIUM 9.2 03/23/2020 1122   PROT 6.1 03/23/2020 1122   ALBUMIN 4.4 10/23/2017 1807   AST 13  03/23/2020 1122   ALT 9 03/23/2020 1122   ALKPHOS 37 (L) 10/23/2017 1807   BILITOT 0.7 03/23/2020 1122   GFRNONAA 39 (L) 03/23/2020 1122   GFRAA 45 (L) 03/23/2020 1122   Lab Results  Component Value Date   CHOL 111 03/23/2020   HDL 45 (L) 03/23/2020   LDLCALC 49 03/23/2020   TRIG 91 03/23/2020   CHOLHDL 2.5 03/23/2020   Lab Results  Component Value Date   HGBA1C 5.4 09/21/2019   Lab Results  Component Value Date   VITAMINB12 501 06/16/2019   Lab Results  Component Value Date   TSH 1.44 03/23/2020      ASSESSMENT AND PLAN 86 y.o. year old female  has a past medical history of C. difficile colitis, Cancer ([place]), Essential and other specified forms of tremor, Osteoporosis (11/2019), Personal history of venous thrombosis and embolism (1997), Shingles, Stroke ([place]), Thrush, and TIA (transient ischemic attack). here with:  1.  Essential tremor  -Continue clonazepam -Continue propranolol currently managed by PCP -Tried and failed primidone -Discussed trying Topamax-she is amendable.  I reviewed potential side effects with the patient and provided her with a handout.  She will start with 25 mg at bedtime. -Advised if symptoms worsen or she develops new symptoms she should let us know -Follow-up in 6 months or sooner if needed   I spent 30 minutes of face-to-face and non-face-to-face time with patient.  This included previsit chart review, lab review, study review, order entry, electronic health record documentation, patient education.  [name], MSN, NP-C 03/27/2020, 10:25 AM [facility] [address] [place], [place] [address] [phone]  I reviewed the above note and documentation by the Nurse Practitioner and  agree with the history, exam, assessment and plan as outlined above. I was available for consultation. [name], MD, PhD [facility])

## 2020-03-28 NOTE — Progress Notes (Signed)
Patient is aware of lab results and instructions. States her blood pressure has been fluctuating since changing her benicar dosing. Will keep a watch on it and call us back by the end of the week if it hasn't leveled out. Wants to come back in 1 month for NV to re-check kidney functions before being referred to urologist. -e welch

## 2020-03-29 ENCOUNTER — Other Ambulatory Visit: Payer: Self-pay | Admitting: Physician Assistant

## 2020-04-07 ENCOUNTER — Ambulatory Visit (INDEPENDENT_AMBULATORY_CARE_PROVIDER_SITE_OTHER): Payer: Medicare PPO | Admitting: Adult Health

## 2020-04-07 ENCOUNTER — Other Ambulatory Visit: Payer: Self-pay

## 2020-04-07 ENCOUNTER — Encounter: Payer: Self-pay | Admitting: Adult Health

## 2020-04-07 VITALS — BP 98/60 | HR 60 | Temp 97.2°F | Resp 16 | Wt 152.6 lb

## 2020-04-07 DIAGNOSIS — I959 Hypotension, unspecified: Secondary | ICD-10-CM

## 2020-04-07 MED ORDER — OLMESARTAN MEDOXOMIL 20 MG PO TABS: ORAL_TABLET | ORAL | 2 refills | Status: DC

## 2020-04-07 NOTE — Progress Notes (Signed)
Assessment and Plan:  Diagnoses and all orders for this visit:  Symptomatic hypotension Considering age and hx of TIA, would like to see her BPs running much higher to avoid risk of symptomatic hypotension and syncope  Discussed reasonable goal of <140/90, occasional up to 150/90 is acceptable at her age and should not cause alarm, want to avoid <100/60 Hold PM dose benicar tonight, check BP tomorrow AM, plan to resume 5 mg in AM, check BP in evenings prior to taking 5 mg evening dose, hold if BPs running low <120/70 Continue to push fluids, has upcoming NV for lab and BP check in a few weeks Follow up sooner if any further concerns with low BPs Go to the ER if any chest pain, persistent shortness of breath, nausea, dizziness, syncope, severe HA, changes vision/speech -     olmesartan (BENICAR) 20 MG tablet; Take 1/4 tablet (5 mg) twice daily for blood pressure. Hold evening dose if BPs running <120/70.  Further disposition pending results of labs. Discussed med's effects and SE's.   Over 30 minutes of exam, counseling, chart review, and critical decision making was performed.   Future Appointments  Date Time Provider [place]  04/25/2020 10:00 AM [facility] NURSE [facility] None  06/29/2020  2:00 PM [name], NP [facility] None  10/02/2020 11:00 AM [name], NP [facility] None  02/28/2021 10:00 AM Vicie Mutters, PA-C [facility] None    ------------------------------------------------------------------------------------------------------------------   HPI BP 98/60   Pulse 60   Temp (!) 97.2 F (36.2 C)   Resp 16   Wt 152 lb 9.6 oz (69.2 kg)   BMI 27.03 kg/m   86 y.o.female with hx of htn, PSVT, restrictive cardiomyopathy, TIA, CKD III presents was walk in for concerns with hypotension, log from home demonstrates recent BPs in the last 2 weeks generally 110s-130s/60-70s, pulse high 50s/low 60s, trending down over the last 3-4 days 90s-100s/50-60s, and finally  very low last evening 90/46, P 56, 99/55, P 58. BP meds were apparently adjusted at recent visit on 4/15. She reports yesterday in the afternoon she was shopping at [employer] and started feeling faint and nauseated, stopped for a grilled chicken sandwich, fries and coke. She denies chest pain, but endorses feeling fatigued, unable to tolerate much exertion, some dizziness which she correlates with lower BP readings <110/70. Denies palpitations, edema (other than last night after fries), PND. She reports has been pushing fluids out of concern for kidneys. Otherwise denies fever/chills, abdominal pain, diarrhea, URI sx, etc.   Recently she was taken off of lisinopril/HCTZ due to low sodium which resolved after d/c'ing, apparently was unable to tolerate amlodipine due to fatigue, flushing, dyspnea. Had cough with ACEi per previous notes.  She had normal stress test 04/2018, event monitor 01/2018. EKG 02/07/2020 showed sinus brady with unchanged 1st degree AV block, rate of 54.   She has been on propranolol 40 mg QID for many years due to tremor, more recently has been placed on olmesartan due to occasional labile/very elevated BPs. In recent weeks has been taking 10 mg at night and 5 mg in AM per recommendations by Estill Bamberg, PA last visit. She believes taking BID dosing due to labile BPs.   Today [name] is BP: 98/60  She does exercise (walk) as tolerated.    Past Medical History:  Diagnosis Date  . C. difficile colitis   . Cancer [facility])    Endometrial cancer  . Essential and other specified forms of tremor   . Osteoporosis  11/2019   T score -2.8 stable from prior DEXA  . Personal history of venous thrombosis and embolism 1997  . Shingles   . Stroke ([place])   . Thrush   . TIA (transient ischemic attack)      Allergies  Allergen Reactions  . Codeine Hives  . Hctz [Hydrochlorothiazide] Other (See Comments)    Low sodium  . Meloxicam Nausea Only    Gastritis and nausea  . Mysoline [Primidone]    . Pneumovax 23 [Pneumococcal Vac Polyvalent]     Red, raised area.  Knot at site of injection  . Toradol [Ketorolac Tromethamine] Swelling    Localized redness at injection site.    . Ace Inhibitors Cough    Current Outpatient Medications on File Prior to Visit  Medication Sig  . aspirin 325 MG tablet Take 325 mg by mouth daily.  Marland Kitchen atorvastatin (LIPITOR) 10 MG tablet Take 1 tablet Daily for Cholesterol  . Calcium-Magnesium-Vitamin D 300-20-200 MG-MG-UNIT CHEW Chew 1 tablet daily by mouth.   . Cholecalciferol (VITAMIN D3) 5000 units TABS Take 2 tablets by mouth daily.   . clonazePAM (KLONOPIN) 0.5 MG tablet TAKE 1 TABLET BY MOUTH EVERY MORNING,1/2 TABLET AT NOON AND 1/2 AT NIGHT  . Magnesium 250 MG TABS Take 250 mg by mouth 2 (two) times a day.  . olmesartan (BENICAR) 20 MG tablet 1/2-1 tablet daily for blood pressure  . propranolol (INDERAL) 40 MG tablet TAKE 1 TABLET BY MOUTH FOUR TIMES DAILY WITH MEALS AND BEDTIME FOR BLOOD PRESSURE AND TREMOR  . topiramate (TOPAMAX) 25 MG tablet Take 1 tablet (25 mg total) by mouth at bedtime.  . vitamin B-12 (CYANOCOBALAMIN) 500 MCG tablet Take 500 mcg by mouth daily. Sublingual   No current facility-administered medications on file prior to visit.    ROS: all negative except above.   Physical Exam:  BP 98/60   Pulse 60   Temp (!) 97.2 F (36.2 C)   Resp 16   Wt 152 lb 9.6 oz (69.2 kg)   BMI 27.03 kg/m   General Appearance: Well nourished, elderly female in no apparent distress. Eyes: PERRLA, conjunctiva no swelling or erythema ENT/Mouth: mask in place; Hearing normal.  Neck: Supple, thyroid normal.  Respiratory: Respiratory effort normal, BS equal bilaterally without rales, rhonchi, wheezing or stridor.  Cardio: RRR with no MRGs. Brisk peripheral pulses without edema.  Abdomen: Soft, + BS.  Non tender, no guarding. Lymphatics: Non tender without lymphadenopathy.  Musculoskeletal: No obvious deformity, steady non-antalgic gait.   Skin: Warm, dry without rashes, lesions, ecchymosis.  Neuro: Normal muscle tone Psych: Awake and oriented X 3, anxious affect, Insight and Judgment appropriate.     Izora Ribas, NP 12:10 PM [facility]

## 2020-04-07 NOTE — Patient Instructions (Addendum)
Goals    . Blood Pressure < 140/90    . DIET - INCREASE WATER INTAKE     Aim for 65 fluid ounces of water or clear liquids daily     . Exercise 150 min/wk Moderate Activity    . LDL CALC < 70       Please hold your evening dose of blood pressure medication if <120/80  Take 1/4 tab (5 mg) olmesartan in the morning, take evening dose of 1/4 tab only if you check your blood pressure and running <120/80  Please monitor your blood pressure, as we get older our body can not respond to a low blood pressure as well as it did when we were younger, for this reason we want a bit higher of a blood pressure as you get older to avoid dizziness and fatigue which can lead to falls. [place] call if your blood pressure is consistently above 150/90.   Please monitor your blood pressure. If it is getting below 120/80 AND you are having fatigue with exertion, dizziness we may need to cut your blood pressure medication in half. Please call the office if this is happening.  Hypotension As your heart beats, it forces blood through your body. This force is called blood pressure. If you have hypotension, you have low blood pressure. When your blood pressure is too low, you may not get enough blood to your brain. You may feel weak, feel lightheaded, have a fast heartbeat, or even pass out (faint). HOME CARE  Drink enough fluids to keep your pee (urine) clear or pale yellow.  Take all medicines as told by your doctor.  Get up slowly after sitting or lying down.  Wear support stockings as told by your doctor.  Maintain a healthy diet by including foods such as fruits, vegetables, nuts, whole grains, and lean meats. GET HELP IF:  You are throwing up (vomiting) or have watery poop (diarrhea).  You have a fever for more than 2-3 days.  You feel more thirsty than usual.  You feel weak and tired. GET HELP RIGHT AWAY IF:   You pass out (faint).  You have chest pain or a fast or irregular heartbeat.  You  lose feeling in part of your body.  You cannot move your arms or legs.  You have trouble speaking.  You get sweaty or feel lightheaded. MAKE SURE YOU:   Understand these instructions.  Will watch your condition.  Will get help right away if you are not doing well or get worse. Document Released: 02/19/2010 Document Revised: 07/28/2013 Document Reviewed: 05/28/2013 [facility] [employer] Patient Information 2015 [place], [place]. This information is not intended to replace advice given to you by your health care provider. Make sure you discuss any questions you have with your health care provider.

## 2020-04-13 ENCOUNTER — Other Ambulatory Visit: Payer: Self-pay | Admitting: Adult Health

## 2020-04-13 DIAGNOSIS — G250 Essential tremor: Secondary | ICD-10-CM

## 2020-04-25 ENCOUNTER — Ambulatory Visit: Payer: Medicare PPO

## 2020-04-25 ENCOUNTER — Other Ambulatory Visit: Payer: Self-pay

## 2020-04-25 VITALS — BP 122/80 | Temp 97.6°F | Wt 152.4 lb

## 2020-04-25 DIAGNOSIS — N183 Chronic kidney disease, stage 3 unspecified: Secondary | ICD-10-CM | POA: Diagnosis not present

## 2020-04-25 LAB — COMPLETE METABOLIC PANEL WITH GFR
AG Ratio: 2.1 (calc) (ref 1.0–2.5)
ALT: 10 U/L (ref 6–29)
AST: 13 U/L (ref 10–35)
Albumin: 4 g/dL (ref 3.6–5.1)
Alkaline phosphatase (APISO): 37 U/L (ref 37–153)
BUN/Creatinine Ratio: 16 (calc) (ref 6–22)
BUN: 22 mg/dL (ref 7–25)
CO2: 27 mmol/L (ref 20–32)
Calcium: 9.3 mg/dL (ref 8.6–10.4)
Chloride: 102 mmol/L (ref 98–110)
Creat: 1.34 mg/dL — ABNORMAL HIGH (ref 0.60–0.88)
GFR, Est African American: 41 mL/min/{1.73_m2} — ABNORMAL LOW (ref >=60)
GFR, Est Non African American: 36 mL/min/{1.73_m2} — ABNORMAL LOW (ref >=60)
Globulin: 1.9 g/dL (calc) (ref 1.9–3.7)
Glucose, Bld: 87 mg/dL (ref 65–99)
Potassium: 4.4 mmol/L (ref 3.5–5.3)
Sodium: 136 mmol/L (ref 135–146)
Total Bilirubin: 0.6 mg/dL (ref 0.2–1.2)
Total Protein: 5.9 g/dL — ABNORMAL LOW (ref 6.1–8.1)

## 2020-04-25 NOTE — Progress Notes (Signed)
Labs for recheck KIDNEY function.

## 2020-06-05 ENCOUNTER — Other Ambulatory Visit: Payer: Self-pay | Admitting: Internal Medicine

## 2020-06-05 DIAGNOSIS — E782 Mixed hyperlipidemia: Secondary | ICD-10-CM

## 2020-06-05 MED ORDER — ATORVASTATIN CALCIUM 10 MG PO TABS: ORAL_TABLET | ORAL | 0 refills | Status: DC

## 2020-06-22 ENCOUNTER — Encounter: Payer: Medicare Other | Admitting: Adult Health

## 2020-06-28 NOTE — Progress Notes (Signed)
Complete Physical  Assessment and Plan:  Routine general exam with abnormal findings  Essential hypertension At goal; continue medication Was having hypotension with olmesartan 20 mg, has reduced to 5 mg with good results Monitor blood pressure at home; call if consistently over 130/80 Continue DASH diet.   Reminder to go to the ER if any CP, SOB, nausea, dizziness, severe HA, changes vision/speech, left arm numbness and tingling and jaw pain.  Complete RBBB Followed by cardiology; monitor   Paroxysmal SVT ([place]) Continue BB; no recurrence; cardiology follows  History of TIA (transient ischemic attack) Continue ASA; follow up with neurology Control blood pressure, cholesterol, glucose, increase exercise.   Essential tremor  Continue follow up with neuro, on inderal, clonazepam PRN Benefit with topamax however ? SE - discuss at upcoming visit  Osteoporosis, unspecified osteoporosis type, unspecified pathological fracture presence Curently on drug break with hx of bisphosphonate tx; recent DEXA shows stable T score; continue follow up with Dr. Soundra Pilon Continue vitamin D supplementation, recommend weight bearing exercises  History of endometrial cancer S/p hysterectomy; continue follow up with Dr. Soundra Pilon  Urethral prolapse S/p hysterectomy; continue follow up with Dr. Soundra Pilon   HT CKD 3 [facility]) Family history of kidney failure in 2 siblings Recent functions trending down; reviewed hx, low risk, no stones, ETOH, NSAIDs, good fluid intake Check CMP/GFR, UA, microalbumin; consider [place] Pending results may proceed with nephrology referral due to family history and recent trend  Vitamin D deficiency At goal; Continue supplementation Check vitamin D level  Hx/o DVT/PE Continue low dose ASA   Mixed hyperlipidemia Well controlled; continue statin Continue low cholesterol diet and exercise.  Check lipid panel  Other abnormal glucose Discussed disease and risks Discussed  diet/exercise, weight management  Check A1C  B12 deficiency On supplement (sublingual); check B12 level  Personal history of skin cancer (basal cell) Followed by derm  Bil cerumen impaction - stop using Qtips, irrigation used in the office without complications, use OTC drops/oil at home to prevent reoccurence  Orders Placed This Encounter  Procedures   CBC with Differential/Platelet   COMPLETE METABOLIC PANEL WITH GFR   Magnesium   Lipid panel   TSH   Hemoglobin A1c   VITAMIN D 25 Hydroxy (Vit-D Deficiency, Fractures)   Microalbumin / creatinine urine ratio   Urinalysis, Routine w reflex microscopic   Vitamin B12   Discussed med's effects and SE's. Screening labs and tests as requested with regular follow-up as recommended. Over 40 minutes of exam, counseling, chart review, and complex, high level critical decision making was performed this visit.   Future Appointments  Date Time Provider [place]  07/07/2020  9:40 AM Fay Records, MD [id number]  10/02/2020 11:00 AM [name], NP [facility] None  10/05/2020  2:00 PM [name], NP [facility] None  02/28/2021 10:00 AM Vicie Mutters, PA-C [facility] None  07/04/2021  2:00 PM [name], NP [facility] None     HPI  86 y.o. female  presents for a complete physical and follow up for has History of endometrial cancer; Hx/o DVT/PE; Osteoporosis; Urethral prolapse; Essential hypertension; Mixed hyperlipidemia; Other abnormal glucose; Vitamin D deficiency; Essential tremor; Medication management; Chronic kidney disease, stage III (moderate); History of TIA (transient ischemic attack); B12 deficiency; History of skin cancer (basal cell); Complete right bundle branch block; and Paroxysmal supraventricular tachycardia ([place]) on their problem list.   She is widowed, 1 living child. She has 2 grandchildren.   The patient was admitted in 10/2017 for a L  Brain TIA w/ transient expressive  aphasia. MRI, MRA, 2D echo, Carotid Dopplers, were Normal. Patient was released on LD bASA and continues on statin. She is followed by Neurology. Takes propranolol 40 mg QID daily and Clonazepam 0.5 mg PRN for tremors, newly on topamax 25 mg and reports this has been helpful, but reports some new metallic taste and some change in sensation of her feet, plans to follow up with neuro.   She is also followed by Dr. Soundra Pilon (GYN) annually who manages PAPs, MMG, DEXA.  Hx of endometrial cancer s/p hysterectomy, urethral prolapse. She is currently on drug break from bisphosphonate for osteoporosis.   BMI is Body mass index is 26.36 kg/m., she has been working on diet and exercise.  Wt Readings from Last 3 Encounters:  06/29/20 151 lb 3.2 oz (68.6 kg)  04/25/20 152 lb 6.4 oz (69.1 kg)  04/07/20 152 lb 9.6 oz (69.2 kg)   Her blood pressure has been controlled at home, today [name] is BP: (!) 130/80 She does workout. She denies chest pain, shortness of breath, does have some dizziness with low BPs, home low showed down in 80s/40s some days; has reduced olmesartan from 20 mg to 5 mg, holds some days with improvement  Follows with Dr. Harrington Challenger after evaluation for episodes of syncope; holter in 2019 showed short burst of SVT, now on propranolol, 2D echo showed LVEF 55-60% with grade 1 DD. Normal myocardial perfusion in 2019. No further episodes. Has follow up next week.   She is on cholesterol medication (atorvastatin 10 mg daily) and denies myalgias. Her cholesterol is at goal. The cholesterol last visit was:   Lab Results  Component Value Date   CHOL 111 03/23/2020   HDL 45 (L) 03/23/2020   LDLCALC 49 03/23/2020   TRIG 91 03/23/2020   CHOLHDL 2.5 03/23/2020   She has been working on diet and exercise for glucose management, she is on bASA, and denies foot ulcerations, increased appetite, nausea, paresthesia of the feet, polydipsia, polyuria, visual disturbances, vomiting and weight loss. Last A1C in  the office was:  Lab Results  Component Value Date   HGBA1C 5.4 09/21/2019   [name] and trending down, family history of kidney failure in brother and sister 75-80 fluid ounces, daily, no alcohol, no NSAIDs, no hematuria or stone hx Last GFR: Lab Results  Component Value Date   GFRNONAA 36 (L) 04/25/2020   GFRNONAA 39 (L) 03/23/2020   GFRNONAA 41 (L) 03/08/2020   Lab Results  Component Value Date   CREATININE 1.34 (H) 04/25/2020   CREATININE 1.26 (H) 03/23/2020   CREATININE 1.19 (H) 03/08/2020   Patient is on Vitamin D supplement   Lab Results  Component Value Date   VD25OH 80 09/21/2019     She is on supplement, taking 1000 mcg tab sublingual daily.  Lab Results  Component Value Date   [id number] 295 06/16/2019    Current Medications:  Current Outpatient Medications on File Prior to Visit  Medication Sig Dispense Refill   aspirin 325 MG tablet Take 325 mg by mouth daily.     atorvastatin (LIPITOR) 10 MG tablet Take 1 tablet Daily for Cholesterol 90 tablet 0   Calcium-Magnesium-Vitamin D 300-20-200 MG-MG-UNIT CHEW Chew 1 tablet daily by mouth.      Cholecalciferol (VITAMIN D3) 5000 units TABS Take 2 tablets by mouth daily.      clonazePAM (KLONOPIN) 0.5 MG tablet TAKE 1 TABLET BY MOUTH EVERY MORNING AND 1/2 TABLET  AT NOON AND 1/2 AT NIGHT 60 tablet 3   Magnesium 250 MG TABS Take 250 mg by mouth 2 (two) times a day.     propranolol (INDERAL) 40 MG tablet TAKE 1 TABLET BY MOUTH FOUR TIMES DAILY WITH MEALS AND BEDTIME FOR BLOOD PRESSURE AND TREMOR 360 tablet 3   topiramate (TOPAMAX) 25 MG tablet Take 1 tablet (25 mg total) by mouth at bedtime. 30 tablet 5   vitamin B-12 (CYANOCOBALAMIN) 500 MCG tablet Take 500 mcg by mouth daily. Sublingual     No current facility-administered medications on file prior to visit.   Allergies:  Allergies  Allergen Reactions   Codeine Hives   Hctz [Hydrochlorothiazide] Other (See Comments)    Low sodium   Meloxicam  Nausea Only    Gastritis and nausea   Mysoline [Primidone]    Pneumovax 23 [Pneumococcal Vac Polyvalent]     Red, raised area.  Knot at site of injection   Toradol [Ketorolac Tromethamine] Swelling    Localized redness at injection site.     Ace Inhibitors Cough   Medical History:  She has History of endometrial cancer; Hx/o DVT/PE; Osteoporosis; Urethral prolapse; Essential hypertension; Mixed hyperlipidemia; Other abnormal glucose; Vitamin D deficiency; Essential tremor; Medication management; Chronic kidney disease, stage III (moderate); History of TIA (transient ischemic attack); B12 deficiency; History of skin cancer (basal cell); Complete right bundle branch block; and Paroxysmal supraventricular tachycardia ([place]) on their problem list. Health Maintenance:   Immunization History  Administered Date(s) Administered   H1N1 01/03/2009   Influenza Whole 09/23/2013   Influenza, High Dose Seasonal PF 09/06/2014, 08/23/2015, 08/21/2016, 08/28/2017, 09/01/2018, 09/14/2019   PFIZER SARS-COV-2 Vaccination 12/29/2019, 01/16/2020   PPD Test 03/29/2014   Pneumococcal Polysaccharide-23 03/29/2014   Pneumococcal-Unspecified 12/09/2002   Td 03/23/2013   Zoster 12/09/2006    Preventative care: Last colonoscopy: 2011 DONE MGM 11/2019  DEXA 2016, 2018, 2020 T-2.8  - by Dr. Phineas Real, Osteoporosis, on break from bisphosphonates PAP/pelvic: 10/2018 negative, Dr. Phineas Real  Influenza 2020 TD 2014 Pneumonia 2015 prevnar 13 has allergy - D/C'd Zoster 2008 Covid 19: 2/2, 2021, pfizer  Names of Other Physician/Practitioners you currently use: 1.  [facility] here for primary care 2. Dr Rutherford Guys, eye doctor, last visit 2019, has cataracts, monitoring 3. Dr Ennis Forts, [place], dentist, last visit 2020 & every 4 months 4. Dr. Elvera Lennox, derm, last visit 2019, goes annually, hx of basal cell   Patient Care Team: Unk Pinto, MD as PCP - General  (Internal Medicine) Fay Records, MD as PCP - Cardiology (Cardiology) Ronald Lobo, MD as Consulting Physician (Gastroenterology) Rutherford Guys, MD as Consulting Physician (Ophthalmology) Clance, Armando Reichert, MD as Consulting Physician (Pulmonary Disease) Fontaine, Belinda Block, MD (Inactive) as Consulting Physician (Gynecology) Love, Alyson Locket, MD as Consulting Physician (Neurology) [name], MD as Attending Physician (Neurology) Harriett Sine, MD as Consulting Physician (Dermatology)  Surgical History:  She has a past surgical history that includes Cesarean section (1961); Tonsillectomy and adenoidectomy; Appendectomy; and Total abdominal hysterectomy w/ bilateral salpingoophorectomy (1997). Family History:  Herfamily history includes Blindness (age of onset: 82) in her sister; Cancer in her father and mother; Dementia in her mother; Diabetes in her sister; Heart disease in her brother, brother, brother, sister, and sister; Hypertension in her sister; Kidney failure in her brother and sister; Prostate cancer in her brother and brother; Skin cancer in her sister. Social History:  She reports that she has never smoked. She has never used smokeless tobacco. She  reports previous alcohol use. She reports that she does not use drugs.  Review of Systems: Review of Systems  Constitutional: Negative for malaise/fatigue and weight loss.  HENT: Negative for hearing loss and tinnitus.   Eyes: Negative for blurred vision and double vision.  Respiratory: Negative for cough, sputum production, shortness of breath and wheezing.   Cardiovascular: Negative for chest pain, palpitations, orthopnea, claudication, leg swelling and PND.  Gastrointestinal: Negative for abdominal pain, blood in stool, constipation, diarrhea, heartburn, melena, nausea and vomiting.  Genitourinary: Negative.   Musculoskeletal: Negative for falls, joint pain and myalgias.  Skin: Negative for rash.  Neurological: Positive for  tremors (Baseline, bilateral hands). Negative for dizziness, tingling, sensory change, weakness and headaches.  Endo/Heme/Allergies: Negative for polydipsia.  Psychiatric/Behavioral: Negative.  Negative for depression, memory loss, substance abuse and suicidal ideas. The patient is not nervous/anxious and does not have insomnia.   All other systems reviewed and are negative.   Physical Exam: Estimated body mass index is 26.36 kg/m as calculated from the following:   Height as of this encounter: 5' 3.5" (1.613 m).   Weight as of this encounter: 151 lb 3.2 oz (68.6 kg). BP (!) 130/80    Pulse 57    Temp (!) 97.5 F (36.4 C)    Ht 5' 3.5" (1.613 m)    Wt 151 lb 3.2 oz (68.6 kg)    SpO2 98%    BMI 26.36 kg/m  General Appearance: Well nourished, in no apparent distress.  Eyes: PERRLA, EOMs, conjunctiva no swelling or erythema Sinuses: No Frontal/maxillary tenderness  ENT/Mouth: Ext aud canal with cerumen. Good dentition. No erythema, swelling, or exudate on post pharynx. Tonsils not swollen or erythematous. Hearing normal.  Neck: Supple, thyroid normal. No bruits  Respiratory: Respiratory effort normal, BS equal bilaterally without rales, rhonchi, wheezing or stridor.  Cardio: RRR without murmurs, rubs or gallops. Brisk peripheral pulses without edema.  Chest: symmetric, with normal excursions and percussion.  Breasts: Defer to GYN  Abdomen: Soft, nontender, no guarding, rebound, hernias, masses, or organomegaly.  Lymphatics: Non tender without lymphadenopathy.  Genitourinary: Defer to GYN Musculoskeletal: Full ROM all peripheral extremities,5/5 strength, and normal gait. Mild kyphosis.   Skin: Warm, dry without rashes, concerning lesions, ecchymosis. Neuro: Cranial nerves intact, reflexes equal bilaterally. Normal muscle tone, no cerebellar symptoms, subtle tremor of bilateral hands, Sensation mildly decreased to left foot to monofilament, 6/10 Psych: Awake and oriented X 3, normal affect,  Insight and Judgment appropriate.   EKG: Reviewed recent from 02/07/2020 Loletha Grayer, RBBB (consistent from previous), 1st degree AV block, no ST changes -defer  Izora Ribas 6:04 PM [facility]

## 2020-06-29 ENCOUNTER — Encounter: Payer: Self-pay | Admitting: Adult Health

## 2020-06-29 ENCOUNTER — Other Ambulatory Visit: Payer: Self-pay

## 2020-06-29 ENCOUNTER — Ambulatory Visit: Payer: Medicare Other | Admitting: Adult Health

## 2020-06-29 VITALS — BP 130/80 | HR 57 | Temp 97.5°F | Ht 63.5 in | Wt 151.2 lb

## 2020-06-29 DIAGNOSIS — Z0000 Encounter for general adult medical examination without abnormal findings: Secondary | ICD-10-CM | POA: Diagnosis not present

## 2020-06-29 DIAGNOSIS — Z79899 Other long term (current) drug therapy: Secondary | ICD-10-CM | POA: Diagnosis not present

## 2020-06-29 DIAGNOSIS — R7309 Other abnormal glucose: Secondary | ICD-10-CM | POA: Diagnosis not present

## 2020-06-29 DIAGNOSIS — E782 Mixed hyperlipidemia: Secondary | ICD-10-CM | POA: Diagnosis not present

## 2020-06-29 DIAGNOSIS — Z85828 Personal history of other malignant neoplasm of skin: Secondary | ICD-10-CM

## 2020-06-29 DIAGNOSIS — E559 Vitamin D deficiency, unspecified: Secondary | ICD-10-CM

## 2020-06-29 DIAGNOSIS — H6123 Impacted cerumen, bilateral: Secondary | ICD-10-CM

## 2020-06-29 DIAGNOSIS — Z1329 Encounter for screening for other suspected endocrine disorder: Secondary | ICD-10-CM | POA: Diagnosis not present

## 2020-06-29 DIAGNOSIS — Z8673 Personal history of transient ischemic attack (TIA), and cerebral infarction without residual deficits: Secondary | ICD-10-CM

## 2020-06-29 DIAGNOSIS — Z136 Encounter for screening for cardiovascular disorders: Secondary | ICD-10-CM

## 2020-06-29 DIAGNOSIS — I10 Essential (primary) hypertension: Secondary | ICD-10-CM

## 2020-06-29 DIAGNOSIS — Z131 Encounter for screening for diabetes mellitus: Secondary | ICD-10-CM

## 2020-06-29 DIAGNOSIS — I4510 Unspecified right bundle-branch block: Secondary | ICD-10-CM

## 2020-06-29 DIAGNOSIS — N183 Chronic kidney disease, stage 3 unspecified: Secondary | ICD-10-CM

## 2020-06-29 DIAGNOSIS — Z86718 Personal history of other venous thrombosis and embolism: Secondary | ICD-10-CM

## 2020-06-29 DIAGNOSIS — M810 Age-related osteoporosis without current pathological fracture: Secondary | ICD-10-CM

## 2020-06-29 DIAGNOSIS — N368 Other specified disorders of urethra: Secondary | ICD-10-CM

## 2020-06-29 DIAGNOSIS — Z8542 Personal history of malignant neoplasm of other parts of uterus: Secondary | ICD-10-CM

## 2020-06-29 DIAGNOSIS — R6889 Other general symptoms and signs: Secondary | ICD-10-CM

## 2020-06-29 DIAGNOSIS — G250 Essential tremor: Secondary | ICD-10-CM

## 2020-06-29 DIAGNOSIS — I471 Supraventricular tachycardia: Secondary | ICD-10-CM

## 2020-06-29 DIAGNOSIS — E538 Deficiency of other specified B group vitamins: Secondary | ICD-10-CM

## 2020-06-29 MED ORDER — OLMESARTAN MEDOXOMIL 5 MG PO TABS: ORAL_TABLET | ORAL | 1 refills | Status: DC

## 2020-06-29 NOTE — Patient Instructions (Addendum)
Monica Curry , Thank you for taking time to come for your Annual Wellness Visit. I appreciate your ongoing commitment to your health goals. Please review the following plan we discussed and let me know if I can assist you in the future.   These are the goals we discussed: Goals    . Blood Pressure < 140/90    . DIET - INCREASE WATER INTAKE     Aim for 65 fluid ounces of water or clear liquids daily     . Exercise 150 min/wk Moderate Activity    . LDL CALC < 70       This is a list of the screening recommended for you and due dates:  Health Maintenance  Topic Date Due  . Flu Shot  07/09/2020  . Mammogram  11/29/2020  . Tetanus Vaccine  03/24/2023  . DEXA scan (bone density measurement)  Completed  . COVID-19 Vaccine  Completed  . Pneumonia vaccines  Discontinued    Chronic Kidney Disease, Adult Chronic kidney disease (CKD) occurs when the kidneys become damaged slowly over a long period of time. The kidneys are a pair of organs that do many important jobs in the body, including:  Removing waste and extra fluid from the blood to make urine.  Making hormones that maintain the amount of fluid in tissues and blood vessels.  Maintaining the right amount of fluids and chemicals in the body. A small amount of kidney damage may not cause problems, but a large amount of damage may make it hard or impossible for the kidneys to work the way they should. If steps are not taken to slow down kidney damage or to stop it from getting worse, the kidneys may stop working permanently (end-stage renal disease or ESRD). Most of the time, CKD does not go away, but it can often be controlled. People who have CKD are usually able to live normal lives. What are the causes? The most common causes of this condition are diabetes and high blood pressure (hypertension). Other causes include:  Heart and blood vessel (cardiovascular) disease.  Kidney diseases, such as: ? Glomerulonephritis. ? Interstitial  nephritis. ? Polycystic kidney disease. ? Renal vascular disease.  Diseases that affect the immune system.  Genetic diseases.  Medicines that damage the kidneys, such as anti-inflammatory medicines.  Being around or being in contact with poisonous (toxic) substances.  A kidney or urinary infection that occurs again and again (recurs).  Vasculitis. This is swelling or inflammation of the blood vessels.  A problem with urine flow that may be caused by: ? Cancer. ? Having kidney stones more than one time. ? An enlarged prostate, in males. What increases the risk? You are more likely to develop this condition if you:  Are older than age 57.  Are female.  Are African-American, Hispanic, Asian, [place], or American [place].  Are a current or former smoker.  Are obese.  Have a family history of kidney disease or failure.  Often take medicines that are damaging to the kidneys. What are the signs or symptoms? Symptoms of this condition include:  Swelling (edema) of the face, legs, ankles, or feet.  Tiredness (lethargy) and having less energy.  Nausea or vomiting.  Confusion or trouble concentrating.  Problems with urination, such as: ? Painful or burning feeling during urination. ? Decreased urine production. ? Frequent urination, especially at night. ? Bloody urine.  Muscle twitches and cramps, especially in the legs.  Shortness of breath.  Weakness.  Loss  of appetite.  Metallic taste in the mouth.  Trouble sleeping.  Dry, itchy skin.  A low blood count (anemia).  Pale lining of the eyelids and surface of the eye (conjunctiva). Symptoms develop slowly and may not be obvious until the kidney damage becomes severe. It is possible to have kidney disease for years without having any symptoms. How is this diagnosed? This condition may be diagnosed based on:  Blood tests.  Urine tests.  Imaging tests, such as an ultrasound or CT scan.  A test  in which a sample of tissue is removed from the kidneys to be examined under a microscope (kidney biopsy). These test results will help your health care provider determine how serious the CKD is. How is this treated? There is no cure for most cases of this condition, but treatment usually relieves symptoms and prevents or slows the progression of the disease. Treatment may include:  Making diet changes, which may require you to avoid alcohol, salty foods (sodium), and foods that are high in potassium, calcium, and protein.  Medicines: ? To lower blood pressure. ? To control blood glucose. ? To relieve anemia. ? To relieve swelling. ? To protect your bones. ? To improve the balance of electrolytes in your blood.  Removing toxic waste from the body through types of dialysis, if the kidneys can no longer do their job (kidney failure).  Managing any other conditions that are causing your CKD or making it worse. Follow these instructions at home: Medicines  Take over-the-counter and prescription medicines only as told by your health care provider. The dose of some medicines that you take may need to be adjusted.  Do not take any new medicines unless approved by your health care provider. Many medicines can worsen your kidney damage.  Do not take any vitamin and mineral supplements unless approved by your health care provider. Many nutritional supplements can worsen your kidney damage. General instructions  Follow your prescribed diet as told by your health care provider.  Do not use any products that contain nicotine or tobacco, such as cigarettes and e-cigarettes. If you need help quitting, ask your health care provider.  Monitor and track your blood pressure at home. Report changes in your blood pressure as told by your health care provider.  If you are being treated for diabetes, monitor and track your blood sugar (blood glucose) levels as told by your health care  provider.  Maintain a healthy weight. If you need help with this, ask your health care provider.  Start or continue an exercise plan. Exercise at least 30 minutes a day, 5 days a week.  Keep your immunizations up to date as told by your health care provider.  Keep all follow-up visits as told by your health care provider. This is important. Where to find more information  American Association of Kidney Patients: BombTimer.gl  National Kidney Foundation: www.kidney.[place]: https://mathis.com/  [facility]: www.lifeoptions.org and www.kidneyschool.org Contact a health care provider if:  Your symptoms get worse.  You develop new symptoms. Get help right away if:  You develop symptoms of ESRD, which include: ? Headaches. ? Numbness in the hands or feet. ? Easy bruising. ? Frequent hiccups. ? Chest pain. ? Shortness of breath. ? Lack of menstruation, in women.  You have a fever.  You have decreased urine production.  You have pain or bleeding when you urinate. Summary  Chronic kidney disease (CKD) occurs when the kidneys become damaged slowly over  a long period of time.  The most common causes of this condition are diabetes and high blood pressure (hypertension).  There is no cure for most cases of this condition, but treatment usually relieves symptoms and prevents or slows the progression of the disease. Treatment may include a combination of medicines and lifestyle changes. This information is not intended to replace advice given to you by your health care provider. Make sure you discuss any questions you have with your health care provider. Document Revised: 11/07/2017 Document Reviewed: 01/02/2017 Elsevier Patient Education  2020 [employer].

## 2020-06-30 ENCOUNTER — Other Ambulatory Visit: Payer: Self-pay | Admitting: Adult Health

## 2020-06-30 DIAGNOSIS — N1832 Chronic kidney disease, stage 3b: Secondary | ICD-10-CM

## 2020-06-30 DIAGNOSIS — N289 Disorder of kidney and ureter, unspecified: Secondary | ICD-10-CM

## 2020-06-30 LAB — VITAMIN D 25 HYDROXY (VIT D DEFICIENCY, FRACTURES): Vit D, 25-Hydroxy: 75 ng/mL (ref 30–100)

## 2020-06-30 LAB — COMPLETE METABOLIC PANEL WITH GFR
AG Ratio: 2 (calc) (ref 1.0–2.5)
ALT: 8 U/L (ref 6–29)
AST: 12 U/L (ref 10–35)
Albumin: 4.1 g/dL (ref 3.6–5.1)
Alkaline phosphatase (APISO): 35 U/L — ABNORMAL LOW (ref 37–153)
BUN/Creatinine Ratio: 15 (calc) (ref 6–22)
BUN: 18 mg/dL (ref 7–25)
CO2: 28 mmol/L (ref 20–32)
Calcium: 9.6 mg/dL (ref 8.6–10.4)
Chloride: 106 mmol/L (ref 98–110)
Creat: 1.24 mg/dL — ABNORMAL HIGH (ref 0.60–0.88)
GFR, Est African American: 46 mL/min/{1.73_m2} — ABNORMAL LOW (ref >=60)
GFR, Est Non African American: 39 mL/min/{1.73_m2} — ABNORMAL LOW (ref >=60)
Globulin: 2.1 g/dL (calc) (ref 1.9–3.7)
Glucose, Bld: 90 mg/dL (ref 65–99)
Potassium: 4.6 mmol/L (ref 3.5–5.3)
Sodium: 138 mmol/L (ref 135–146)
Total Bilirubin: 0.6 mg/dL (ref 0.2–1.2)
Total Protein: 6.2 g/dL (ref 6.1–8.1)

## 2020-06-30 LAB — CBC WITH DIFFERENTIAL/PLATELET
Absolute Monocytes: 519 cells/uL (ref 200–950)
Basophils Absolute: 68 cells/uL (ref 0–200)
Basophils Relative: 1.2 %
Eosinophils Absolute: 200 cells/uL (ref 15–500)
Eosinophils Relative: 3.5 %
HCT: 43.8 % (ref 35.0–45.0)
Hemoglobin: 14.1 g/dL (ref 11.7–15.5)
Lymphs Abs: 2514 cells/uL (ref 850–3900)
MCH: 28.5 pg (ref 27.0–33.0)
MCHC: 32.2 g/dL (ref 32.0–36.0)
MCV: 88.5 fL (ref 80.0–100.0)
MPV: 10 fL (ref 7.5–12.5)
Monocytes Relative: 9.1 %
Neutro Abs: 2400 cells/uL (ref 1500–7800)
Neutrophils Relative %: 42.1 %
Platelets: 327 10*3/uL (ref 140–400)
RBC: 4.95 10*6/uL (ref 3.80–5.10)
RDW: 14 % (ref 11.0–15.0)
Total Lymphocyte: 44.1 %
WBC: 5.7 10*3/uL (ref 3.8–10.8)

## 2020-06-30 LAB — HEMOGLOBIN A1C
Hgb A1c MFr Bld: 5.4 % of total Hgb (ref <5.7)
Mean Plasma Glucose: 108 (calc)
eAG (mmol/L): 6 (calc)

## 2020-06-30 LAB — LIPID PANEL
Cholesterol: 120 mg/dL (ref <200)
HDL: 43 mg/dL — ABNORMAL LOW (ref >=50)
LDL Cholesterol (Calc): 58 mg/dL (calc)
Non-HDL Cholesterol (Calc): 77 mg/dL (calc) (ref <130)
Total CHOL/HDL Ratio: 2.8 (calc) (ref <5.0)
Triglycerides: 109 mg/dL (ref <150)

## 2020-06-30 LAB — URINALYSIS, ROUTINE W REFLEX MICROSCOPIC
Bilirubin Urine: NEGATIVE
Glucose, UA: NEGATIVE
Hgb urine dipstick: NEGATIVE
Ketones, ur: NEGATIVE
Leukocytes,Ua: NEGATIVE
Nitrite: NEGATIVE
Protein, ur: NEGATIVE
Specific Gravity, Urine: 1.006 (ref 1.001–1.03)
pH: 6.5 (ref 5.0–8.0)

## 2020-06-30 LAB — MICROALBUMIN / CREATININE URINE RATIO
Creatinine, Urine: 25 mg/dL (ref 20–275)
Microalb Creat Ratio: 12 mcg/mg creat (ref <30)
Microalb, Ur: 0.3 mg/dL

## 2020-06-30 LAB — TSH: TSH: 1.81 mIU/L (ref 0.40–4.50)

## 2020-06-30 LAB — VITAMIN B12: Vitamin B-12: 830 pg/mL (ref 200–1100)

## 2020-06-30 LAB — MAGNESIUM: Magnesium: 1.9 mg/dL (ref 1.5–2.5)

## 2020-07-05 ENCOUNTER — Other Ambulatory Visit: Payer: Self-pay

## 2020-07-05 ENCOUNTER — Ambulatory Visit
Admission: RE | Admit: 2020-07-05 | Discharge: 2020-07-05 | Disposition: A | Discharge status: Home / Self Care | Payer: Medicare PPO | Source: Ambulatory Visit | Attending: Adult Health | Admitting: Adult Health

## 2020-07-05 DIAGNOSIS — N1832 Chronic kidney disease, stage 3b: Secondary | ICD-10-CM

## 2020-07-05 DIAGNOSIS — Q600 Renal agenesis, unilateral: Secondary | ICD-10-CM | POA: Diagnosis not present

## 2020-07-05 DIAGNOSIS — N281 Cyst of kidney, acquired: Secondary | ICD-10-CM | POA: Diagnosis not present

## 2020-07-05 DIAGNOSIS — N289 Disorder of kidney and ureter, unspecified: Secondary | ICD-10-CM

## 2020-07-06 ENCOUNTER — Other Ambulatory Visit: Payer: Self-pay | Admitting: Adult Health

## 2020-07-06 DIAGNOSIS — H5213 Myopia, bilateral: Secondary | ICD-10-CM | POA: Diagnosis not present

## 2020-07-06 DIAGNOSIS — N1832 Chronic kidney disease, stage 3b: Secondary | ICD-10-CM

## 2020-07-06 DIAGNOSIS — H524 Presbyopia: Secondary | ICD-10-CM | POA: Diagnosis not present

## 2020-07-06 DIAGNOSIS — Z841 Family history of disorders of kidney and ureter: Secondary | ICD-10-CM

## 2020-07-06 DIAGNOSIS — N281 Cyst of kidney, acquired: Secondary | ICD-10-CM | POA: Insufficient documentation

## 2020-07-06 DIAGNOSIS — H2513 Age-related nuclear cataract, bilateral: Secondary | ICD-10-CM | POA: Diagnosis not present

## 2020-07-06 DIAGNOSIS — H02052 Trichiasis without entropian right lower eyelid: Secondary | ICD-10-CM | POA: Diagnosis not present

## 2020-07-06 NOTE — Progress Notes (Signed)
+   Cardiology Office Note   Date:  7/98/9211   ID:  Chinaza Rooke, DOB [birth year], MRN [id number]  PCP:  Unk Pinto, MD  Cardiologist:   Dorris Carnes, MD   F/U of HTN     History of Present Illness: Monica Curry is a 86 y.o. female with a history of a TIA with expressive aphasia, HTN,  SVT (on event monitor; placed on diltiazem) CKD stage III, hx of pulmonary embolism, DVT, essential tremors and presyncope   I last saw the pt in Jan 2021  Pateint says she has had signif problems with her BP   It has been up and down  She is seen by PA in Dr Jacelyn Pi office   INitally she was told to take an olmesartan if her systolic BP was 149/ or over   Now takes olmesartan If over 150     Breathing OK  No CP  No palpitations    Not a lot of energy at times   Current Meds  Medication Sig  . aspirin 325 MG tablet Take 325 mg by mouth daily.  Marland Kitchen atorvastatin (LIPITOR) 10 MG tablet Take 1 tablet Daily for Cholesterol  . Calcium-Magnesium-Vitamin D 300-20-200 MG-MG-UNIT CHEW Chew 1 tablet daily by mouth.   . Cholecalciferol (VITAMIN D3) 5000 units TABS Take 2 tablets by mouth daily.   . clonazePAM (KLONOPIN) 0.5 MG tablet TAKE 1 TABLET BY MOUTH EVERY MORNING AND 1/2 TABLET AT NOON AND 1/2 AT NIGHT  . Magnesium 250 MG TABS Take 250 mg by mouth 2 (two) times a day.  . olmesartan (BENICAR) 5 MG tablet Take 1 tab daily for blood pressure; hold if running <120/70.  Marland Kitchen propranolol (INDERAL) 40 MG tablet TAKE 1 TABLET BY MOUTH FOUR TIMES DAILY WITH MEALS AND BEDTIME FOR BLOOD PRESSURE AND TREMOR  . topiramate (TOPAMAX) 25 MG tablet Take 1 tablet (25 mg total) by mouth at bedtime.  . vitamin B-12 (CYANOCOBALAMIN) 500 MCG tablet Take 500 mcg by mouth daily. Sublingual     Allergies:   Codeine, Hctz [hydrochlorothiazide], Meloxicam, Mysoline [primidone], Pneumovax 23 [pneumococcal vac polyvalent], Toradol [ketorolac tromethamine], and Ace inhibitors   Past Medical History:  Diagnosis Date  . C.  difficile colitis   . Cancer [facility])    Endometrial cancer  . Essential and other specified forms of tremor   . Osteoporosis 11/2019   T score -2.8 stable from prior DEXA  . Personal history of venous thrombosis and embolism 1997  . Shingles   . Stroke ([place])   . Thrush   . TIA (transient ischemic attack)     Past Surgical History:  Procedure Laterality Date  . APPENDECTOMY    . [place]  . TONSILLECTOMY AND ADENOIDECTOMY    . TOTAL ABDOMINAL HYSTERECTOMY W/ BILATERAL SALPINGOOPHORECTOMY  1997     Social History:  The patient  reports that she has never smoked. She has never used smokeless tobacco. She reports previous alcohol use. She reports that she does not use drugs.   Family History:  The patient's family history includes Blindness (age of onset: 40) in her sister; Cancer in her father and mother; Dementia in her mother; Diabetes in her sister; Heart disease in her brother, brother, brother, sister, and sister; Hypertension in her sister; Kidney failure in her brother and sister; Prostate cancer in her brother and brother; Skin cancer in her sister.    ROS:  Please see the history of present illness. All other systems are  reviewed and  Negative to the above problem except as noted.    PHYSICAL EXAM: VS:  BP 114/68   Pulse 58   Ht 5' 3.5" (1.613 m)   Wt 151 lb 3.2 oz (68.6 kg)   SpO2 98%   BMI 26.36 kg/m   GEN: Well nourished, well developed in no acute distress  HEENT: normal  Neck: no JVD, carotid bruits Cardiac: RRR; no murmurs, rubs, or gallops,no LE edema  Respiratory:  clear to auscultation bilaterally, normal work of breathing GI: soft, nontender, nondistended, + BS  No hepatomegaly  MS: no deformity Moving all extremities   Skin: warm and dry, Bruise on R buttock   Neuro:  Strength and sensation are intact Psych: euthymic mood, full affect   EKG:  EKG is not ordered today.   Lipid Panel    Component Value Date/Time   CHOL 120 06/29/2020  1505   TRIG 109 06/29/2020 1505   HDL 43 (L) 06/29/2020 1505   CHOLHDL 2.8 06/29/2020 1505   VLDL 24 10/24/2017 0629   LDLCALC 58 06/29/2020 1505      Wt Readings from Last 3 Encounters:  07/07/20 151 lb 3.2 oz (68.6 kg)  06/29/20 151 lb 3.2 oz (68.6 kg)  04/25/20 152 lb 6.4 oz (69.1 kg)    Additional studies/ records that were reviewed today include:   Stress test 04/2018  Nuclear stress EF: 76%.  There was no ST segment deviation noted during stress.  No T wave inversion was noted during stress.  The study is normal.  This is a low risk study.  The left ventricular ejection fraction is hyperdynamic (>65%).  Low risk stress nuclear study with normal perfusion and normal left ventricular regional and global systolic function.  Monitor 01/2018 NSR with sinus bradycardia 2. Atrial tachycardia at 160 is present 3. Rare PVC's 4. Noise artifact  Echo 10/2017 Study Conclusions  - Left ventricle: The cavity size was normal. Systolic function was normal. The estimated ejection fraction was in the range of 55% to 60%. Wall motion was normal; there were no regional wall motion abnormalities. Doppler parameters are consistent with abnormal left ventricular relaxation (grade 1 diastolic dysfunction). - Mitral valve: There was mild regurgitation. - Left atrium: The atrium was moderately dilated.  Impressions:  - No cardiac source of emboli was indentified.   ASSESSMENT AND PLAN:  1   HTN  Keep on current regimen   I concur that I would not recomm extra meds (olmesartan) unless systolic is high 540J to 811B and up   Not at 120   Risk of hypotension with poor cerebral perfusion is significant at those pressures  2  HL   Keep on lipitor  Recent labs LDL 58  HDL 43     3   Hx SVT   On monitor   She denies palpitations  4   Hx TIA Keep on ASA and lipitor     F/U in 9 months     Current medicines are reviewed at length with the patient today.  The  patient does not have concerns regarding medicines.  Signed, Dorris Carnes, MD  07/07/2020 9:59 AM    [facility] [place], [place], [place]  [address] Phone: [phone]; Fax: [phone]

## 2020-07-07 ENCOUNTER — Other Ambulatory Visit: Payer: Self-pay

## 2020-07-07 ENCOUNTER — Encounter: Payer: Self-pay | Admitting: Internal Medicine

## 2020-07-07 ENCOUNTER — Ambulatory Visit: Payer: Medicare PPO | Admitting: Internal Medicine

## 2020-07-07 VITALS — BP 114/68 | HR 58 | Ht 63.5 in | Wt 151.2 lb

## 2020-07-07 DIAGNOSIS — I10 Essential (primary) hypertension: Secondary | ICD-10-CM

## 2020-07-07 NOTE — Patient Instructions (Signed)
Medication Instructions:  No changes *If you need a refill on your cardiac medications before your next appointment, please call your pharmacy*   Lab Work: None If you have labs (blood work) drawn today and your tests are completely normal, you will receive your results only by: Marland Kitchen MyChart Message (if you have MyChart) OR . A paper copy in the mail If you have any lab test that is abnormal or we need to change your treatment, we will call you to review the results.   Testing/Procedures: none   Follow-Up: At [facility], you and your health needs are our priority.  As part of our continuing mission to provide you with exceptional heart care, we have created designated Provider Care Teams.  These Care Teams include your primary Cardiologist (physician) and Advanced Practice Providers (APPs -  Physician Assistants and Nurse Practitioners) who all work together to provide you with the care you need, when you need it.  We recommend signing up for the patient portal called "MyChart".  Sign up information is provided on this After Visit Summary.  MyChart is used to connect with patients for Virtual Visits (Telemedicine).  Patients are able to view lab/test results, encounter notes, upcoming appointments, etc.  Non-urgent messages can be sent to your provider as well.   To learn more about what you can do with MyChart, go to [email or web address].    Your next appointment:   9 month(s)  The format for your next appointment:   In Person  Provider:   You may see Dorris Carnes, MD or one of the following Advanced Practice Providers on your designated Care Team:    Richardson Dopp, PA-C  Robbie Lis, [place]    Other Instructions

## 2020-07-26 DIAGNOSIS — H2512 Age-related nuclear cataract, left eye: Secondary | ICD-10-CM | POA: Diagnosis not present

## 2020-07-26 DIAGNOSIS — H2511 Age-related nuclear cataract, right eye: Secondary | ICD-10-CM | POA: Diagnosis not present

## 2020-07-26 DIAGNOSIS — H25012 Cortical age-related cataract, left eye: Secondary | ICD-10-CM | POA: Diagnosis not present

## 2020-08-02 DIAGNOSIS — H2512 Age-related nuclear cataract, left eye: Secondary | ICD-10-CM | POA: Diagnosis not present

## 2020-08-09 ENCOUNTER — Other Ambulatory Visit: Payer: Self-pay | Admitting: Adult Health

## 2020-08-09 DIAGNOSIS — G250 Essential tremor: Secondary | ICD-10-CM

## 2020-08-09 NOTE — Telephone Encounter (Signed)
Pt called needing a refill for her clonazePAM (KLONOPIN) 0.5 MG tablet sent in to the [facility] on [address] Pt would like enough to last her until she is seen again in the office. Please advise.

## 2020-08-10 MED ORDER — CLONAZEPAM 0.5 MG PO TABS: ORAL_TABLET | ORAL | 3 refills | Status: DC

## 2020-09-04 ENCOUNTER — Other Ambulatory Visit: Payer: Self-pay | Admitting: Internal Medicine

## 2020-09-04 DIAGNOSIS — E782 Mixed hyperlipidemia: Secondary | ICD-10-CM

## 2020-09-05 DIAGNOSIS — M899 Disorder of bone, unspecified: Secondary | ICD-10-CM | POA: Diagnosis not present

## 2020-09-05 DIAGNOSIS — N1832 Chronic kidney disease, stage 3b: Secondary | ICD-10-CM | POA: Diagnosis not present

## 2020-09-05 DIAGNOSIS — N281 Cyst of kidney, acquired: Secondary | ICD-10-CM | POA: Diagnosis not present

## 2020-09-05 DIAGNOSIS — G459 Transient cerebral ischemic attack, unspecified: Secondary | ICD-10-CM | POA: Diagnosis not present

## 2020-09-05 DIAGNOSIS — I129 Hypertensive chronic kidney disease with stage 1 through stage 4 chronic kidney disease, or unspecified chronic kidney disease: Secondary | ICD-10-CM | POA: Diagnosis not present

## 2020-09-19 ENCOUNTER — Other Ambulatory Visit: Payer: Self-pay

## 2020-09-19 ENCOUNTER — Ambulatory Visit (INDEPENDENT_AMBULATORY_CARE_PROVIDER_SITE_OTHER): Payer: Medicare PPO | Admitting: *Deleted

## 2020-09-19 VITALS — Temp 97.1°F

## 2020-09-19 DIAGNOSIS — Z23 Encounter for immunization: Secondary | ICD-10-CM | POA: Diagnosis not present

## 2020-09-21 ENCOUNTER — Other Ambulatory Visit: Payer: Self-pay | Admitting: Adult Health

## 2020-10-01 NOTE — Progress Notes (Addendum)
PATIENT: Risk manager DOB: [birth year]  REASON FOR VISIT: follow up HISTORY FROM: patient  HISTORY OF PRESENT ILLNESS: Today 10/01/20:  Ms. Roorda is an 86 year old female with a history of essential tremor.  She returns today for follow-up she continues on clonazepam and propranolol.  At the last visit Topamax 25 mg was added.  She states that this has been extremely beneficial.  She states that her daughter just recently visited and noticed the difference in her tremors.  The tremor primarily is in the hands right greater than left.  She sometimes will notice it with her handwriting.  She is able to complete all ADLs independently.  She returns today for an evaluation.   HISTORY 03/27/20:  Ms. Goffredo is an 86 year old female with a history of essential tremor.  She returns today for follow-up.  She continues on clonazepam and propranolol.  She feels that her tremor has worsened.  She states that she has to hold a glass with 2 hands.  Her handwriting has gotten worse.  She did try to get a weighted glove but felt that it offered her no benefit.  She is able to complete ADLs without help.  She returns today for an evaluation.   REVIEW OF SYSTEMS: Out of a complete 14 system review of symptoms, the patient complains only of the following symptoms, and all other reviewed systems are negative.  See HPI  ALLERGIES: Allergies  Allergen Reactions  . Codeine Hives  . Hctz [Hydrochlorothiazide] Other (See Comments)    Low sodium  . Meloxicam Nausea Only    Gastritis and nausea  . Mysoline [Primidone]   . Pneumovax 23 [Pneumococcal Vac Polyvalent]     Red, raised area.  Knot at site of injection  . Toradol [Ketorolac Tromethamine] Swelling    Localized redness at injection site.    Humberto Leep Inhibitors Cough    HOME MEDICATIONS: Outpatient Medications Prior to Visit  Medication Sig Dispense Refill  . aspirin 325 MG tablet Take 325 mg by mouth daily.    Marland Kitchen atorvastatin  (LIPITOR) 10 MG tablet Take    1 tablet   Daily    for Cholesterol 90 tablet 0  . Calcium-Magnesium-Vitamin D 300-20-200 MG-MG-UNIT CHEW Chew 1 tablet daily by mouth.     . Cholecalciferol (VITAMIN D3) 5000 units TABS Take 2 tablets by mouth daily.     . clonazePAM (KLONOPIN) 0.5 MG tablet TAKE 1 TABLET BY MOUTH EVERY MORNING AND 1/2 TABLET AT NOON AND 1/2 AT NIGHT 60 tablet 3  . Magnesium 250 MG TABS Take 250 mg by mouth 2 (two) times a day.    . olmesartan (BENICAR) 5 MG tablet Take 1 tab daily for blood pressure; hold if running <120/70. 90 tablet 1  . propranolol (INDERAL) 40 MG tablet TAKE 1 TABLET BY MOUTH FOUR TIMES DAILY WITH MEALS AND BEDTIME FOR BLOOD PRESSURE AND TREMOR 360 tablet 3  . topiramate (TOPAMAX) 25 MG tablet TAKE 1 TABLET(25 MG) BY MOUTH AT BEDTIME 30 tablet 5  . vitamin B-12 (CYANOCOBALAMIN) 500 MCG tablet Take 500 mcg by mouth daily. Sublingual     No facility-administered medications prior to visit.    PAST MEDICAL HISTORY: Past Medical History:  Diagnosis Date  . C. difficile colitis   . Cancer [facility])    Endometrial cancer  . Essential and other specified forms of tremor   . Osteoporosis 11/2019   T score -2.8 stable from prior DEXA  . Personal history of venous thrombosis  and embolism 1997  . Shingles   . Stroke ([place])   . Thrush   . TIA (transient ischemic attack)     PAST SURGICAL HISTORY: Past Surgical History:  Procedure Laterality Date  . APPENDECTOMY    . [place]  . TONSILLECTOMY AND ADENOIDECTOMY    . TOTAL ABDOMINAL HYSTERECTOMY W/ BILATERAL SALPINGOOPHORECTOMY  1997    FAMILY HISTORY: Family History  Problem Relation Age of Onset  . Cancer Mother        Unknown type  . Dementia Mother   . Cancer Father        Bone  . Heart disease Brother   . Prostate cancer Brother        Prostate  . Heart disease Brother   . Kidney failure Brother   . Prostate cancer Brother        Prostate  . Heart disease Sister   . Blindness  Sister 19       temporal arteritis  . Skin cancer Sister        basal and melanoma  . Heart disease Sister   . Kidney failure Sister        Kidney transplant  . Diabetes Sister   . Hypertension Sister   . Heart disease Brother     SOCIAL HISTORY: Social History   Socioeconomic History  . Marital status: Widowed    Spouse name: Not on file  . Number of children: 1  . Years of education: Not on file  . Highest education level: Not on file  Occupational History  . Not on file  Tobacco Use  . Smoking status: Never Smoker  . Smokeless tobacco: Never Used  Vaping Use  . Vaping Use: Never used  Substance and Sexual Activity  . Alcohol use: Not Currently    Alcohol/week: 0.0 standard drinks  . Drug use: No  . Sexual activity: Not Currently    Birth control/protection: Post-menopausal, Surgical    Comment: HYST-1st intercourse 86 yo-Fewer than 5 partners  Other Topics Concern  . Not on file  Social History Narrative  . Not on file   Social Determinants of Health   Financial Resource Strain:   . Difficulty of Paying Living Expenses: Not on file  Food Insecurity:   . Worried About Charity fundraiser in the Last Year: Not on file  . Ran Out of Food in the Last Year: Not on file  Transportation Needs:   . Lack of Transportation (Medical): Not on file  . Lack of Transportation (Non-Medical): Not on file  Physical Activity:   . Days of Exercise per Week: Not on file  . Minutes of Exercise per Session: Not on file  Stress:   . Feeling of Stress : Not on file  Social Connections:   . Frequency of Communication with Friends and Family: Not on file  . Frequency of Social Gatherings with Friends and Family: Not on file  . Attends Religious Services: Not on file  . Active Member of Clubs or Organizations: Not on file  . Attends Archivist Meetings: Not on file  . Marital Status: Not on file  Intimate Partner Violence:   . Fear of Current or Ex-Partner: Not on file   . Emotionally Abused: Not on file  . Physically Abused: Not on file  . Sexually Abused: Not on file      PHYSICAL EXAM  Vitals:   10/02/20 1057  BP: 124/80  Pulse: 74  Weight:  153 lb 9.6 oz (69.7 kg)  Height: 5' 3.5" (1.613 m)   Body mass index is 26.78 kg/m.  Generalized: Well developed, in no acute distress   Neurological examination  Mentation: Alert oriented to time, place, history taking. Follows all commands speech and language fluent Cranial nerve II-XII: Pupils were equal round reactive to light. Extraocular movements were full, visual field were full on confrontational test. Facial sensation and strength were normal. Uvula tongue midline. Head turning and shoulder shrug  were normal and symmetric. Motor: The motor testing reveals 5 over 5 strength of all 4 extremities. Good symmetric motor tone is noted throughout.  Sensory: Sensory testing is intact to soft touch on all 4 extremities. No evidence of extinction is noted.  Intention tremor noted in the upper extremities right greater than left Coordination: Cerebellar testing reveals good finger-nose-finger and heel-to-shin bilaterally.  Gait and station: Gait is normal.  Reflexes: Deep tendon reflexes are symmetric and normal bilaterally.   DIAGNOSTIC DATA (LABS, IMAGING, TESTING) - I reviewed patient records, labs, notes, testing and imaging myself where available.  Lab Results  Component Value Date   WBC 5.7 06/29/2020   HGB 14.1 06/29/2020   HCT 43.8 06/29/2020   MCV 88.5 06/29/2020   PLT 327 06/29/2020      Component Value Date/Time   NA 138 06/29/2020 1505   K 4.6 06/29/2020 1505   CL 106 06/29/2020 1505   CO2 28 06/29/2020 1505   GLUCOSE 90 06/29/2020 1505   BUN 18 06/29/2020 1505   CREATININE 1.24 (H) 06/29/2020 1505   CALCIUM 9.6 06/29/2020 1505   PROT 6.2 06/29/2020 1505   ALBUMIN 4.4 10/23/2017 1807   AST 12 06/29/2020 1505   ALT 8 06/29/2020 1505   ALKPHOS 37 (L) 10/23/2017 1807   BILITOT  0.6 06/29/2020 1505   GFRNONAA 39 (L) 06/29/2020 1505   GFRAA 46 (L) 06/29/2020 1505   Lab Results  Component Value Date   CHOL 120 06/29/2020   HDL 43 (L) 06/29/2020   LDLCALC 58 06/29/2020   TRIG 109 06/29/2020   CHOLHDL 2.8 06/29/2020   Lab Results  Component Value Date   HGBA1C 5.4 06/29/2020   Lab Results  Component Value Date   VITAMINB12 830 06/29/2020   Lab Results  Component Value Date   TSH 1.81 06/29/2020      ASSESSMENT AND PLAN 86 y.o. year old female  has a past medical history of C. difficile colitis, Cancer ([place]), Essential and other specified forms of tremor, Osteoporosis (11/2019), Personal history of venous thrombosis and embolism (1997), Shingles, Stroke ([place]), Thrush, and TIA (transient ischemic attack). here with:  1. Essential Tremor  - Continue Propranolol - Continue Clonazepam -Continue Topamax - Advised that if symptoms worsen or she develops new symptoms she should let us know - FU 6 months or sooner if needed    I spent 25 minutes of face-to-face and non-face-to-face time with patient.  This included previsit chart review, lab review, study review, order entry, electronic health record documentation, patient education.  [name], MSN, NP-C 10/01/2020, 9:41 PM [facility] [address] [place], [place] [address] [phone]  I reviewed the above note and documentation by the Nurse Practitioner and agree with the history, exam, assessment and plan as outlined above. I was available for consultation. [name], MD, PhD [facility])

## 2020-10-02 ENCOUNTER — Encounter: Payer: Self-pay | Admitting: Adult Health

## 2020-10-02 ENCOUNTER — Ambulatory Visit: Payer: Medicare PPO | Admitting: Adult Health

## 2020-10-02 DIAGNOSIS — G250 Essential tremor: Secondary | ICD-10-CM

## 2020-10-02 MED ORDER — CLONAZEPAM 0.5 MG PO TABS: ORAL_TABLET | ORAL | 5 refills | Status: DC

## 2020-10-02 NOTE — Patient Instructions (Signed)
Your Plan:  Continue Topamax, propranolol and clonazepam If your symptoms worsen or you develop new symptoms please let us know.   Thank you for coming to see [place] at [facility]. I hope we have been able to provide you high quality care today.  You may receive a patient satisfaction survey over the next few weeks. We would appreciate your feedback and comments so that we may continue to improve ourselves and the health of our patients.

## 2020-10-04 NOTE — Progress Notes (Signed)
3 MONTH FOLLOW UP  Assessment and Plan:   Essential hypertension At goal; continue medication Was having hypotension with olmesartan 20 mg, has reduced to 5 mg PRN if 150/90+ with good results Monitor blood pressure at home; call if consistently over 150/90 Continue DASH diet.   Reminder to go to the ER if any CP, SOB, nausea, dizziness, severe HA, changes vision/speech, left arm numbness and tingling and jaw pain.  Paroxysmal SVT ([place]) Continue BB; no recurrence; cardiology follows  History of TIA (transient ischemic attack) Continue ASA; follow up with neurology Control blood pressure, cholesterol, glucose, increase exercise.   Essential tremor  Continue follow up with neuro, on inderal, clonazepam PRN Benefit with topamax however ? SE - discuss at upcoming visit   HT CKD 3 ([place]) Family history of kidney failure in 2 siblings US showed simple renal cysts, small left kidney  Has seen [place] [facility], continue to monitor per [name]  Vitamin D deficiency At goal; Continue supplementation Check vitamin D level annually   Hx/o DVT/PE Continue low dose ASA   Mixed hyperlipidemia Well controlled; continue statin Continue low cholesterol diet and exercise.  Check lipid panel  Other abnormal glucose Discussed disease and risks Discussed diet/exercise, weight management  Check A1C annually/q53m if abnormal   She had CBC, CMP/GFR, magnesium, phos, UA at Dr. Keturah Barre office on 09/05/2020; she received lab bill for this; on review, only other than lipids which have been at goal, no changes; defer labs today   Discussed med's effects and SE's. Screening labs and tests as requested with regular follow-up as recommended. Over 40 minutes of exam, counseling, chart review, and complex, high level critical decision making was performed this visit.   Future Appointments  Date Time Provider [place]  11/09/2020 11:30 AM Joseph Pierini, MD [name]  02/28/2021  9:15 AM  [name], NP [facility] None  07/04/2021  2:00 PM [name], NP [facility] None  10/02/2021 11:00 AM [name], NP [facility] None     HPI  86 y.o. female  presents for 3 month follow up for has History of endometrial cancer; Hx/o DVT/PE; Osteoporosis; Urethral prolapse; Essential hypertension; Mixed hyperlipidemia; Other abnormal glucose; Vitamin D deficiency; Essential tremor; Medication management; Chronic kidney disease, stage III (moderate) ([place]); History of TIA (transient ischemic attack); B12 deficiency; History of skin cancer (basal cell); Complete right bundle branch block; Paroxysmal supraventricular tachycardia ([place]); and Bilateral renal cysts on their problem list.   The patient was admitted in 10/2017 for a L Brain TIA w/ transient expressive aphasia. MRI, MRA, 2D echo, Carotid Dopplers, were Normal. Patient was released on LD bASA and continues on statin. She is followed by Neurology.   Takes propranolol 40 mg QID daily and Clonazepam 0.5 mg PRN for tremors, more recently on topamax 25 mg with significant improvement.  BMI is Body mass index is 26.5 kg/m., she has been working on diet and exercise.  Wt Readings from Last 3 Encounters:  10/05/20 152 lb (68.9 kg)  10/02/20 153 lb 9.6 oz (69.7 kg)  07/07/20 151 lb 3.2 oz (68.6 kg)   Her blood pressure has been controlled at home, today [name] is BP: 118/80 She does workout. She denies chest pain, shortness of breath, does have some dizziness with low BPs, now taking olmesartan 5 mg PRN systolic 591+   Follows with Dr. Harrington Challenger after evaluation for episodes of syncope; holter in 2019 showed short burst of SVT, now on propranolol, 2D echo showed LVEF 55-60% with  grade 1 DD. Normal myocardial perfusion in 2019. No further episodes.   She is on cholesterol medication (atorvastatin 10 mg daily) and denies myalgias. Her cholesterol is at goal. The cholesterol last visit was:   Lab Results  Component Value Date   CHOL 120  06/29/2020   HDL 43 (L) 06/29/2020   LDLCALC 58 06/29/2020   TRIG 109 06/29/2020   CHOLHDL 2.8 06/29/2020   She has been working on diet and exercise for glucose management, she is on bASA, and denies foot ulcerations, increased appetite, nausea, paresthesia of the feet, polydipsia, polyuria, visual disturbances, vomiting and weight loss. Last A1C in the office was:  Lab Results  Component Value Date   HGBA1C 5.4 06/29/2020   [name], family history of kidney failure in brother and sister 75-80 fluid ounces daily, no alcohol, no NSAIDs, no hematuria or stone hx US showed simple renal cysts, markedly small L kidney Saw [place] [name] who recommends monitoring;  Pt reports had CBC, CMP, magnesium, phos, UA on 09/05/2020 at Dr. Keturah Barre office, no concerns, Cr was reportedly improved from 1.2 to 1.0, GFR was improved  Last GFR: Lab Results  Component Value Date   GFRNONAA 39 (L) 06/29/2020   GFRNONAA 36 (L) 04/25/2020   GFRNONAA 39 (L) 03/23/2020   Lab Results  Component Value Date   CREATININE 1.24 (H) 06/29/2020   CREATININE 1.34 (H) 04/25/2020   CREATININE 1.26 (H) 03/23/2020   Patient is on Vitamin D supplement   Lab Results  Component Value Date   VD25OH 64 06/29/2020     She is on supplement, taking 1000 mcg tab sublingual daily.  Lab Results  Component Value Date   [id number] 06/29/2020    Current Medications:  Current Outpatient Medications on File Prior to Visit  Medication Sig Dispense Refill  . aspirin 325 MG tablet Take 325 mg by mouth daily.    Marland Kitchen atorvastatin (LIPITOR) 10 MG tablet Take    1 tablet   Daily    for Cholesterol 90 tablet 0  . Calcium-Magnesium-Vitamin D 300-20-200 MG-MG-UNIT CHEW Chew 1 tablet daily by mouth.     . Cholecalciferol (VITAMIN D3) 5000 units TABS Take 2 tablets by mouth daily.     . clonazePAM (KLONOPIN) 0.5 MG tablet TAKE 1 TABLET BY MOUTH EVERY MORNING AND 1/2 TABLET AT NOON AND 1/2 AT NIGHT 60 tablet 5  .  Magnesium 250 MG TABS Take 250 mg by mouth 2 (two) times a day.    . olmesartan (BENICAR) 5 MG tablet Take 1 tablet by mouth daily as needed. For blood pressure only if 150/90+    . propranolol (INDERAL) 40 MG tablet TAKE 1 TABLET BY MOUTH FOUR TIMES DAILY WITH MEALS AND BEDTIME FOR BLOOD PRESSURE AND TREMOR 360 tablet 3  . topiramate (TOPAMAX) 25 MG tablet TAKE 1 TABLET(25 MG) BY MOUTH AT BEDTIME 30 tablet 5  . vitamin B-12 (CYANOCOBALAMIN) 500 MCG tablet Take 500 mcg by mouth daily. Sublingual     No current facility-administered medications on file prior to visit.   Allergies:  Allergies  Allergen Reactions  . Codeine Hives  . Hctz [Hydrochlorothiazide] Other (See Comments)    Low sodium  . Meloxicam Nausea Only    Gastritis and nausea  . Mysoline [Primidone]   . Pneumovax 23 [Pneumococcal Vac Polyvalent]     Red, raised area.  Knot at site of injection  . Toradol [Ketorolac Tromethamine] Swelling    Localized redness at  injection site.    . Ace Inhibitors Cough   Medical History:  She has History of endometrial cancer; Hx/o DVT/PE; Osteoporosis; Urethral prolapse; Essential hypertension; Mixed hyperlipidemia; Other abnormal glucose; Vitamin D deficiency; Essential tremor; Medication management; Chronic kidney disease, stage III (moderate) ([place]); History of TIA (transient ischemic attack); B12 deficiency; History of skin cancer (basal cell); Complete right bundle branch block; Paroxysmal supraventricular tachycardia ([place]); and Bilateral renal cysts on their problem list.  Surgical History:  She has a past surgical history that includes Cesarean section (1961); Tonsillectomy and adenoidectomy; Appendectomy; and Total abdominal hysterectomy w/ bilateral salpingoophorectomy (1997). Family History:  Herfamily history includes Blindness (age of onset: 73) in her sister; Cancer in her father and mother; Dementia in her mother; Diabetes in her sister; Heart disease in her brother, brother,  brother, sister, and sister; Hypertension in her sister; Kidney failure in her brother and sister; Prostate cancer in her brother and brother; Skin cancer in her sister. Social History:  She reports that she has never smoked. She has never used smokeless tobacco. She reports previous alcohol use. She reports that she does not use drugs.  Review of Systems: Review of Systems  Constitutional: Negative for malaise/fatigue and weight loss.  HENT: Negative for hearing loss and tinnitus.   Eyes: Negative for blurred vision and double vision.  Respiratory: Negative for cough, sputum production, shortness of breath and wheezing.   Cardiovascular: Negative for chest pain, palpitations, orthopnea, claudication, leg swelling and PND.  Gastrointestinal: Negative for abdominal pain, blood in stool, constipation, diarrhea, heartburn, melena, nausea and vomiting.  Genitourinary: Negative.   Musculoskeletal: Negative for falls, joint pain and myalgias.  Skin: Negative for rash.  Neurological: Positive for tremors (Baseline, bilateral hands). Negative for dizziness, tingling, sensory change, weakness and headaches.  Endo/Heme/Allergies: Negative for polydipsia.  Psychiatric/Behavioral: Negative.  Negative for depression, memory loss, substance abuse and suicidal ideas. The patient is not nervous/anxious and does not have insomnia.   All other systems reviewed and are negative.   Physical Exam: Estimated body mass index is 26.5 kg/m as calculated from the following:   Height as of 10/02/20: 5' 3.5" (1.613 m).   Weight as of this encounter: 152 lb (68.9 kg). BP 118/80   Pulse 75   Temp (!) 95.9 F (35.5 C)   Wt 152 lb (68.9 kg)   SpO2 99%   BMI 26.50 kg/m  General Appearance: Well nourished, in no apparent distress.  Eyes: PERRLA, EOMs, conjunctiva no swelling or erythema Sinuses: No Frontal/maxillary tenderness  ENT/Mouth: Ext aud canal with cerumen. Good dentition. No erythema, swelling, or  exudate on post pharynx. Tonsils not swollen or erythematous. Hearing normal.  Neck: Supple, thyroid normal. No bruits  Respiratory: Respiratory effort normal, BS equal bilaterally without rales, rhonchi, wheezing or stridor.  Cardio: RRR without murmurs, rubs or gallops. Brisk peripheral pulses without edema.  Chest: symmetric, with normal excursions and percussion.  Abdomen: Soft, nontender, no guarding, rebound, hernias, masses, or organomegaly.  Lymphatics: Non tender without lymphadenopathy.  Musculoskeletal: Full ROM all peripheral extremities,5/5 strength, and normal gait. Mild kyphosis.   Skin: Warm, dry without rashes, concerning lesions, ecchymosis. Neuro: Cranial nerves intact, reflexes equal bilaterally. Normal muscle tone, no cerebellar symptoms, subtle tremor of bilateral hands Psych: Awake and oriented X 3, normal affect, Insight and Judgment appropriate.   Gorden Harms Carlie Solorzano 2:24 PM [facility]

## 2020-10-05 ENCOUNTER — Encounter: Payer: Self-pay | Admitting: Adult Health

## 2020-10-05 ENCOUNTER — Ambulatory Visit: Payer: Medicare PPO | Admitting: Adult Health

## 2020-10-05 ENCOUNTER — Other Ambulatory Visit: Payer: Self-pay

## 2020-10-05 VITALS — BP 118/80 | HR 75 | Temp 95.9°F | Wt 152.0 lb

## 2020-10-05 DIAGNOSIS — E782 Mixed hyperlipidemia: Secondary | ICD-10-CM | POA: Diagnosis not present

## 2020-10-05 DIAGNOSIS — G250 Essential tremor: Secondary | ICD-10-CM

## 2020-10-05 DIAGNOSIS — N183 Chronic kidney disease, stage 3 unspecified: Secondary | ICD-10-CM | POA: Diagnosis not present

## 2020-10-05 DIAGNOSIS — E559 Vitamin D deficiency, unspecified: Secondary | ICD-10-CM

## 2020-10-05 DIAGNOSIS — I471 Supraventricular tachycardia, unspecified: Secondary | ICD-10-CM

## 2020-10-05 DIAGNOSIS — I10 Essential (primary) hypertension: Secondary | ICD-10-CM | POA: Diagnosis not present

## 2020-10-05 DIAGNOSIS — R7309 Other abnormal glucose: Secondary | ICD-10-CM | POA: Diagnosis not present

## 2020-10-25 DIAGNOSIS — L309 Dermatitis, unspecified: Secondary | ICD-10-CM | POA: Diagnosis not present

## 2020-10-25 DIAGNOSIS — D1801 Hemangioma of skin and subcutaneous tissue: Secondary | ICD-10-CM | POA: Diagnosis not present

## 2020-10-25 DIAGNOSIS — D225 Melanocytic nevi of trunk: Secondary | ICD-10-CM | POA: Diagnosis not present

## 2020-10-25 DIAGNOSIS — Z85828 Personal history of other malignant neoplasm of skin: Secondary | ICD-10-CM | POA: Diagnosis not present

## 2020-10-25 DIAGNOSIS — L821 Other seborrheic keratosis: Secondary | ICD-10-CM | POA: Diagnosis not present

## 2020-10-25 DIAGNOSIS — L570 Actinic keratosis: Secondary | ICD-10-CM | POA: Diagnosis not present

## 2020-10-25 DIAGNOSIS — L738 Other specified follicular disorders: Secondary | ICD-10-CM | POA: Diagnosis not present

## 2020-11-09 ENCOUNTER — Ambulatory Visit (INDEPENDENT_AMBULATORY_CARE_PROVIDER_SITE_OTHER): Payer: Medicare PPO | Admitting: Obstetrics and Gynecology

## 2020-11-09 ENCOUNTER — Other Ambulatory Visit: Payer: Self-pay

## 2020-11-09 ENCOUNTER — Encounter: Payer: Self-pay | Admitting: Obstetrics and Gynecology

## 2020-11-09 VITALS — BP 120/74 | Ht 63.5 in | Wt 152.0 lb

## 2020-11-09 DIAGNOSIS — Z8542 Personal history of malignant neoplasm of other parts of uterus: Secondary | ICD-10-CM

## 2020-11-09 DIAGNOSIS — Z01419 Encounter for gynecological examination (general) (routine) without abnormal findings: Secondary | ICD-10-CM

## 2020-11-09 DIAGNOSIS — M810 Age-related osteoporosis without current pathological fracture: Secondary | ICD-10-CM

## 2020-11-09 NOTE — Progress Notes (Signed)
Monica Curry [birth year] [id number]  SUBJECTIVE:  86 y.o. G83P2001 female here for a breast and pelvic exam. She has no gynecologic concerns.  Current Outpatient Medications  Medication Sig Dispense Refill  . aspirin 325 MG tablet Take 325 mg by mouth daily.    Marland Kitchen atorvastatin (LIPITOR) 10 MG tablet Take    1 tablet   Daily    for Cholesterol 90 tablet 0  . Calcium-Magnesium-Vitamin D 300-20-200 MG-MG-UNIT CHEW Chew 1 tablet daily by mouth.     . Cholecalciferol (VITAMIN D3) 5000 units TABS Take 2 tablets by mouth daily.     . clonazePAM (KLONOPIN) 0.5 MG tablet TAKE 1 TABLET BY MOUTH EVERY MORNING AND 1/2 TABLET AT NOON AND 1/2 AT NIGHT 60 tablet 5  . Magnesium 250 MG TABS Take 250 mg by mouth 2 (two) times a day.    . olmesartan (BENICAR) 5 MG tablet Take 1 tablet by mouth daily as needed. For blood pressure only if 150/90+    . propranolol (INDERAL) 40 MG tablet TAKE 1 TABLET BY MOUTH FOUR TIMES DAILY WITH MEALS AND BEDTIME FOR BLOOD PRESSURE AND TREMOR 360 tablet 3  . topiramate (TOPAMAX) 25 MG tablet TAKE 1 TABLET(25 MG) BY MOUTH AT BEDTIME 30 tablet 5  . vitamin B-12 (CYANOCOBALAMIN) 500 MCG tablet Take 500 mcg by mouth daily. Sublingual     No current facility-administered medications for this visit.   Allergies: Codeine, Hctz [hydrochlorothiazide], Meloxicam, Mysoline [primidone], Pneumovax 23 [pneumococcal vac polyvalent], Toradol [ketorolac tromethamine], and Ace inhibitors  No LMP recorded. Patient has had a hysterectomy.  Past medical history,surgical history, problem list, medications, allergies, family history and social history were all reviewed and documented as reviewed in the EPIC chart.  GYN ROS: no abnormal bleeding, pelvic pain or discharge, no breast pain or new or enlarging lumps on self exam.  No dysuria, frequency, burning, pain with urination, cloudy/malodorous urine.   OBJECTIVE:  BP 120/74   Ht 5' 3.5" (1.613 m)   Wt 152 lb (68.9 kg)   BMI 26.50 kg/m    The patient appears well, alert, oriented, in no distress.  BREAST EXAM: breasts appear normal, no suspicious masses, no skin or nipple changes or axillary nodes  PELVIC EXAM: VULVA: normal appearing vulva with atrophic changes, no masses, tenderness or lesions, VAGINA: normal appearing vagina with atrophic changes, normal color and discharge, no lesions, CERVIX: surgically absent, UTERUS: surgically absent, vaginal cuff normal, ADNEXA: no masses, nontender, RECTAL: normal rectal, no masses  Chaperone: [name] present during the examination  ASSESSMENT:  86 y.o. G2P2001 here for a breast and pelvic exam  PLAN:   1. Postmenopausal.  No significant menopausal symptoms.  2. Prior TAH/BSO in 1997 for endometrial adenocarcinoma, well differentiated.  Per previous notes, the lesion was primarily in situ with a focus of minimal myometrial invasion of 2 mm. 3. Pap smear 10/2018.  Considering the current SGO recommendations and screening guidelines, she is comfortable with not continuing with Pap smears. 4. Mammogram 11/2019.  Normal breast exam today.  She is reminded to schedule an annual mammogram this year when due. 5. Colonoscopy 2012.  She will follow up at the interval recommended by her GI specialist.   6.  Osteoporosis.  DEXA 11/2019.  T score -2.8, stable from prior DEXA.  History of bisphosphonate use x10 years, currently on drug-free holiday.  Next DEXA recommended next year at 2-year interval. 7. Health maintenance.  No labs today as she normally has these completed elsewhere.  Return annually or sooner, prn.  Joseph Pierini MD 11/09/20

## 2020-11-23 ENCOUNTER — Other Ambulatory Visit: Payer: Self-pay | Admitting: Internal Medicine

## 2020-11-23 DIAGNOSIS — E782 Mixed hyperlipidemia: Secondary | ICD-10-CM

## 2020-12-06 DIAGNOSIS — Z1231 Encounter for screening mammogram for malignant neoplasm of breast: Secondary | ICD-10-CM | POA: Diagnosis not present

## 2020-12-06 LAB — HM MAMMOGRAPHY

## 2020-12-11 DIAGNOSIS — Z961 Presence of intraocular lens: Secondary | ICD-10-CM | POA: Diagnosis not present

## 2020-12-13 ENCOUNTER — Other Ambulatory Visit: Payer: Self-pay | Admitting: Adult Health

## 2020-12-13 DIAGNOSIS — G250 Essential tremor: Secondary | ICD-10-CM

## 2020-12-13 MED ORDER — CLONAZEPAM 0.5 MG PO TABS: ORAL_TABLET | ORAL | 5 refills | Status: DC

## 2020-12-13 NOTE — Telephone Encounter (Signed)
Pt is requesting a refill for clonazePAM (KLONOPIN) 0.5 MG tablet .  Pharmacy: [id number]   

## 2020-12-13 NOTE — Addendum Note (Signed)
Addended by: Jacqualine Code D on: 12/13/2020 03:18 PM   Modules accepted: Orders

## 2020-12-14 ENCOUNTER — Other Ambulatory Visit: Payer: Self-pay

## 2020-12-14 DIAGNOSIS — G250 Essential tremor: Secondary | ICD-10-CM

## 2020-12-14 MED ORDER — CLONAZEPAM 0.5 MG PO TABS: ORAL_TABLET | ORAL | 5 refills | Status: DC

## 2020-12-14 NOTE — Addendum Note (Signed)
Addended by: Jacqualine Code D on: 12/14/2020 03:36 PM   Modules accepted: Orders

## 2020-12-14 NOTE — Telephone Encounter (Signed)
Prescription was unable to be sent electronically, Rx was faxed per [facility].

## 2020-12-14 NOTE — Addendum Note (Signed)
Addended by: Jacqualine Code D on: 12/14/2020 11:41 AM   Modules accepted: Orders

## 2020-12-14 NOTE — Telephone Encounter (Signed)
Pt is asking for a call on the refill status of her clonazePAM (KLONOPIN) 0.5 MG tablet

## 2021-01-09 NOTE — Progress Notes (Signed)
ANNUAL MEDICARE VISIT  Assessment and Plan:  Encounter for Annual Medicare Visit Due annually   Essential hypertension At goal; continue medication Was having hypotension, now doing olmesartan 5 mg PRN 150/90+ Monitor blood pressure at home; call if consistently over 150/0 Continue DASH diet.   Reminder to go to the ER if any CP, SOB, nausea, dizziness, severe HA, changes vision/speech, left arm numbness and tingling and jaw pain.  Complete RBBB Followed by cardiology; monitor   Paroxysmal SVT ([place]) Continue BB; no recurrence; cardiology follows  History of TIA (transient ischemic attack) Continue ASA; follow up with neurology Control blood pressure, cholesterol, glucose, increase exercise.   Essential tremor  Continue follow up with neuro, on inderal, topamax, clonazepam PRN  Osteoporosis, unspecified osteoporosis type, unspecified pathological fracture presence Curently on drug break with hx of bisphosphonate tx; last DEXA showed stable T score; continue follow up with GYN, due this year Continue vitamin D supplementation, recommend weight bearing exercises  History of endometrial cancer S/p hysterectomy; continue follow up with GYN  Urethral prolapse S/p hysterectomy; continue follow up with GYN   HT CKD 3 ([place]) Family history of kidney failure in 2 siblings Bilateral renal cysts Increase fluids, avoid NSAIDS, monitor sugars, will monitor Nephrology is following  Vitamin D deficiency At goal; Continue supplementation  Hx/o DVT/PE Continue low dose ASA    Mixed hyperlipidemia Well controlled; continue statin Continue low cholesterol diet and exercise.  Check lipid panel q3-6 months  Other abnormal glucose Discussed disease and risks Discussed diet/exercise, weight management  Check A1C annually   B12 deficiency On supplement (sublingual); check B12 level  Personal history of skin cancer (basal cell) Followed by derm   Orders Placed This Encounter   Procedures  . CBC with Differential/Platelet  . COMPLETE METABOLIC PANEL WITH GFR  . Magnesium  . Lipid panel  . TSH   Discussed med's effects and SE's. Screening labs and tests as requested with regular follow-up as recommended. Over 40 minutes of exam, counseling, chart review, and complex, high level critical decision making was performed this visit.   Future Appointments  Date Time Provider [place]  07/04/2021  2:00 PM [name], NP [facility] None  10/02/2021 11:00 AM [name], NP [facility] None  01/10/2022  2:30 PM [name], NP [facility] None    Plan:   During the course of the visit the patient was educated and counseled about appropriate screening and preventive services including:    Pneumococcal vaccine   Prevnar 13  Influenza vaccine  Td vaccine  Screening electrocardiogram  Bone densitometry screening  Colorectal cancer screening  Diabetes screening  Glaucoma screening  Nutrition counseling   Advanced directives: requested    HPI  87 y.o. female  presents for a AWV and follow up for has History of endometrial cancer; Hx/o DVT/PE; Osteoporosis; Urethral prolapse; Essential hypertension; Mixed hyperlipidemia; Other abnormal glucose; Vitamin D deficiency; Essential tremor; Medication management; Chronic kidney disease, stage III (moderate) ([place]); History of TIA (transient ischemic attack); B12 deficiency; History of skin cancer (basal cell); Complete right bundle branch block; Paroxysmal supraventricular tachycardia ([place]); and Bilateral renal cysts on their problem list.   She is widowed, 1 living child. She has 2 grandchildren.   The patient was admitted in 10/2017 for a L Brain TIA w/ transient expressive aphasia. MRI, MRA, 2D echo, Carotid Dopplers, were Normal. Patient was released on LD bASA and continues on statin. She is followed by Neurology. Takes propranolol 40 mg QID daily and Clonazepam 0.5  mg PRN for tremors, newly  on topamax 25 mg and reports this has been helpful, but reports some new metallic taste and some change in sensation of her feet, plans to follow up with neuro.   She was also followed by Dr. Soundra Pilon (GYN) and Dr. Delilah Shan, retired and will have new provider, goes annually who manages PAPs, MMG (at [facility]), DEXA. Hx of endometrial cancer s/p hysterectomy, urethral prolapse. She is currently on drug break from bisphosphonate for osteoporosis.   BMI is Body mass index is 26.5 kg/m., she has been working on diet, admits very little exercise recently with weather. She reports has back pain with extended walking, will do short distances with neighbors when the weather permits. She has tai chi exercises that she is planning to restarted.  Wt Readings from Last 3 Encounters:  01/10/21 152 lb (68.9 kg)  11/09/20 152 lb (68.9 kg)  10/05/20 152 lb (68.9 kg)   Her blood pressure has been controlled at home, today [name] is BP: 114/72 She does not workout. She denies chest pain, shortness of breath, was having dizziness but improved since reducing olmesartan, taking 5 mg PRN systolic 784+, hasn't needed to take in the last 3 months.   Follows with Dr. Harrington Challenger after evaluation for episodes of syncope; holter in 2019 showed short burst of SVT, now on propranolol, 2D echo showed LVEF 55-60% with grade 1 DD. Normal myocardial perfusion in 2019. No further episodes.  She is on cholesterol medication (atorvastatin 10 mg daily) and denies myalgias. Her cholesterol is at goal. The cholesterol last visit was:   Lab Results  Component Value Date   CHOL 120 06/29/2020   HDL 43 (L) 06/29/2020   LDLCALC 58 06/29/2020   TRIG 109 06/29/2020   CHOLHDL 2.8 06/29/2020   She has been working on diet and exercise for glucose management, she is on bASA, and denies foot ulcerations, increased appetite, nausea, paresthesia of the feet, polydipsia, polyuria, visual disturbances, vomiting and weight loss. Last A1C in the office  was:  Lab Results  Component Value Date   HGBA1C 5.4 06/29/2020   [name], family history of kidney failure in brother and sister 50-80 fluid ounces, daily, no alcohol, no NSAIDs, no hematuria or stone hx US showed simple renal cysts, markedly small L kidney Saw [place] [facility] [name] in 2021 who recommends monitoring only at this time;  Has follow up 03/06/2021 planned Last GFR: Lab Results  Component Value Date   GFRNONAA 39 (L) 06/29/2020   GFRNONAA 36 (L) 04/25/2020   GFRNONAA 39 (L) 03/23/2020   Lab Results  Component Value Date   CREATININE 1.24 (H) 06/29/2020   CREATININE 1.34 (H) 04/25/2020   CREATININE 1.26 (H) 03/23/2020   Patient is on Vitamin D supplement   Lab Results  Component Value Date   VD25OH 46 06/29/2020     She is on supplement, taking 1000 mcg tab sublingual daily.  Lab Results  Component Value Date   [id number] 324 06/29/2020    Current Medications:  Current Outpatient Medications on File Prior to Visit  Medication Sig Dispense Refill  . aspirin 325 MG tablet Take 325 mg by mouth daily.    Marland Kitchen atorvastatin (LIPITOR) 10 MG tablet TAKE 1 TABLET BY MOUTH DAILY FOR CHOLESTEROL 90 tablet 0  . Calcium-Magnesium-Vitamin D 300-20-200 MG-MG-UNIT CHEW Chew 1 tablet daily by mouth.     . Cholecalciferol (VITAMIN D3) 5000 units TABS Take 2 tablets by mouth daily.     Marland Kitchen  clonazePAM (KLONOPIN) 0.5 MG tablet TAKE 1 TABLET BY MOUTH EVERY MORNING AND 1/2 TABLET AT NOON AND 1/2 AT NIGHT 60 tablet 5  . Magnesium 250 MG TABS Take 250 mg by mouth 2 (two) times a day.    . propranolol (INDERAL) 40 MG tablet TAKE 1 TABLET BY MOUTH FOUR TIMES DAILY WITH MEALS AND BEDTIME FOR BLOOD PRESSURE AND TREMOR 360 tablet 3  . topiramate (TOPAMAX) 25 MG tablet TAKE 1 TABLET(25 MG) BY MOUTH AT BEDTIME 30 tablet 5  . vitamin B-12 (CYANOCOBALAMIN) 500 MCG tablet Take 500 mcg by mouth daily. Sublingual    . olmesartan (BENICAR) 5 MG tablet Take 1 tablet by mouth daily as  needed. For blood pressure only if 150/90+ (Patient not taking: Reported on 01/10/2021)     No current facility-administered medications on file prior to visit.   Allergies:  Allergies  Allergen Reactions  . Codeine Hives  . Hctz [Hydrochlorothiazide] Other (See Comments)    Low sodium  . Meloxicam Nausea Only    Gastritis and nausea  . Mysoline [Primidone]   . Pneumovax 23 [Pneumococcal Vac Polyvalent]     Red, raised area.  Knot at site of injection  . Toradol [Ketorolac Tromethamine] Swelling    Localized redness at injection site.    . Ace Inhibitors Cough   Medical History:  She has History of endometrial cancer; Hx/o DVT/PE; Osteoporosis; Urethral prolapse; Essential hypertension; Mixed hyperlipidemia; Other abnormal glucose; Vitamin D deficiency; Essential tremor; Medication management; Chronic kidney disease, stage III (moderate) ([place]); History of TIA (transient ischemic attack); B12 deficiency; History of skin cancer (basal cell); Complete right bundle branch block; Paroxysmal supraventricular tachycardia ([place]); and Bilateral renal cysts on their problem list. Health Maintenance:   Immunization History  Administered Date(s) Administered  . H1N1 01/03/2009  . Influenza Whole 09/23/2013  . Influenza, High Dose Seasonal PF 09/06/2014, 08/23/2015, 08/21/2016, 08/28/2017, 09/01/2018, 09/14/2019, 09/19/2020  . PFIZER(Purple Top)SARS-COV-2 Vaccination 12/29/2019, 01/16/2020, 09/06/2020  . PPD Test 03/29/2014  . Pneumococcal Polysaccharide-23 03/29/2014  . Pneumococcal-Unspecified 12/09/2002  . Td 03/23/2013  . Zoster 12/09/2006    Preventative care: Last colonoscopy: 2011 DONE MGM 11/2020 - reports was normal  DEXA 2016, 2018, 2020 T-2.8  - by Dr. Phineas Real, Osteoporosis, on break from bisphosphonates ,will get this year by GYN PAP/pelvic: 10/2018 negative, Dr. Phineas Real  Influenza 2021 TD 2014 Pneumonia 2015 prevnar 13 has allergy - D/C'd Zoster 2008 Covid 19: 3/3, 2021,  pfizer  Names of Other Physician/Practitioners you currently use: 1. [facility] here for primary care 2. Dr Rutherford Guys, eye doctor, last visit 2022 3. Dr Milas Gain, DDS, dentist, last visit 2022 & every 4 months 4. Dr. Elvera Lennox, derm, last visit 2021, goes annually, hx of basal cell   Patient Care Team: Unk Pinto, MD as PCP - General (Internal Medicine) Fay Records, MD as PCP - Cardiology (Cardiology) Ronald Lobo, MD as Consulting Physician (Gastroenterology) Rutherford Guys, MD as Consulting Physician (Ophthalmology) Clance, Armando Reichert, MD as Consulting Physician (Pulmonary Disease) Fontaine, Belinda Block, MD (Inactive) as Consulting Physician (Gynecology) Love, Alyson Locket, MD as Consulting Physician (Neurology) [name], MD as Attending Physician (Neurology) Harriett Sine, MD as Consulting Physician (Dermatology)  Surgical History:  She has a past surgical history that includes Cesarean section (1961); Tonsillectomy and adenoidectomy; Appendectomy (1961); Total abdominal hysterectomy w/ bilateral salpingoophorectomy (1997); and cataract surg (Bilateral, 2021). Family History:  Herfamily history includes Blindness (age of onset: 42) in her sister; Cancer in her father  and mother; Dementia in her mother; Diabetes in her sister; Heart disease in her brother, brother, brother, sister, and sister; Hypertension in her sister; Kidney failure in her brother and sister; Prostate cancer in her brother and brother; Skin cancer in her sister. Social History:  She reports that she has never smoked. She has never used smokeless tobacco. She reports previous alcohol use. She reports that she does not use drugs.  MEDICARE WELLNESS OBJECTIVES: Physical activity: Current Exercise Habits: The patient does not participate in regular exercise at present, Exercise limited by: None identified Cardiac risk factors: Cardiac Risk Factors include: advanced age  (>77mn, >>40women);dyslipidemia;hypertension;sedentary lifestyle Depression/mood screen:   Depression screen [facility]2/9 01/10/2021  Decreased Interest 0  Down, Depressed, Hopeless 0  PHQ - 2 Score 0    ADLs:  In your present state of health, do you have any difficulty performing the following activities: 01/10/2021 02/16/2020  Hearing? N N  Vision? N Y  Comment - cataracts, can not drive at night  Difficulty concentrating or making decisions? N N  Walking or climbing stairs? N N  Dressing or bathing? N N  Doing errands, shopping? N N  Some recent data might be hidden     Cognitive Testing  Alert? Yes  Normal Appearance?Yes  Oriented to person? Yes  Place? Yes   Time? Yes  Recall of three objects?  Yes  Can perform simple calculations? Yes  Displays appropriate judgment?Yes  Can read the correct time from a watch face?Yes  EOL planning: Does Patient Have a Medical Advance Directive?: Yes Type of Advance Directive: [place] Does patient want to make changes to medical advance directive?: No - Patient declined Copy of [place] Chart?: Yes - validated most recent copy scanned in chart (See row information)      Review of Systems: Review of Systems  Constitutional: Negative for malaise/fatigue and weight loss.  HENT: Negative for hearing loss and tinnitus.   Eyes: Negative for blurred vision and double vision.  Respiratory: Negative for cough, sputum production, shortness of breath and wheezing.   Cardiovascular: Negative for chest pain, palpitations, orthopnea, claudication, leg swelling and PND.  Gastrointestinal: Negative for abdominal pain, blood in stool, constipation, diarrhea, heartburn, melena, nausea and vomiting.  Genitourinary: Negative.   Musculoskeletal: Negative for falls, joint pain and myalgias.  Skin: Negative for rash.  Neurological: Positive for tremors (Baseline, bilateral hands). Negative for dizziness,  tingling, sensory change, weakness and headaches.  Endo/Heme/Allergies: Negative for polydipsia.  Psychiatric/Behavioral: Negative.  Negative for depression, memory loss, substance abuse and suicidal ideas. The patient is not nervous/anxious and does not have insomnia.   All other systems reviewed and are negative.   Physical Exam: Estimated body mass index is 26.5 kg/m as calculated from the following:   Height as of this encounter: 5' 3.5" (1.613 m).   Weight as of this encounter: 152 lb (68.9 kg). BP 114/72   Pulse (!) 56   Temp 97.7 F (36.5 C)   Ht 5' 3.5" (1.613 m)   Wt 152 lb (68.9 kg)   SpO2 98%   BMI 26.50 kg/m  General Appearance: Well nourished, in no apparent distress.  Eyes: PERRLA, EOMs, conjunctiva no swelling or erythema Sinuses: No Frontal/maxillary tenderness  ENT/Mouth: Ext aud canal with cerumen. Good dentition. No erythema, swelling, or exudate on post pharynx. Tonsils not swollen or erythematous. Hearing normal.  Neck: Supple, thyroid normal. No bruits  Respiratory: Respiratory effort normal, BS equal  bilaterally without rales, rhonchi, wheezing or stridor.  Cardio: RRR without murmurs, rubs or gallops. Brisk peripheral pulses without edema.  Chest: symmetric, with normal excursions and percussion.  Breasts: Defer to GYN  Abdomen: Soft, nontender, no guarding, rebound, hernias, masses, or organomegaly.  Lymphatics: Non tender without lymphadenopathy.  Genitourinary: Defer to GYN Musculoskeletal: Full ROM all peripheral extremities,5/5 strength, and normal gait. Mild kyphosis.   Skin: Warm, dry without rashes, concerning lesions, ecchymosis. Neuro: Cranial nerves intact, reflexes equal bilaterally. Normal muscle tone, no cerebellar symptoms, subtle tremor of bilateral hands, Sensation mildly decreased to left foot to monofilament, 6/10 Psych: Awake and oriented X 3, normal affect, Insight and Judgment appropriate.   EKG: Reviewed recent from 02/07/2020 Loletha Grayer,  RBBB (consistent from previous), 1st degree AV block, no ST changes -defer   Medicare Attestation I have personally reviewed: The patient's medical and social history Their use of alcohol, tobacco or illicit drugs Their current medications and supplements The patient's functional ability including ADLs,fall risks, home safety risks, cognitive, and hearing and visual impairment Diet and physical activities Evidence for depression or mood disorders  The patient's weight, height, BMI, and visual acuity have been recorded in the chart.  I have made referrals, counseling, and provided education to the patient based on review of the above and I have provided the patient with a written personalized care plan for preventive services.      Gorden Harms Jaxsyn Azam 2:42 PM [facility]

## 2021-01-10 ENCOUNTER — Encounter: Payer: Self-pay | Admitting: Adult Health

## 2021-01-10 ENCOUNTER — Other Ambulatory Visit: Payer: Self-pay

## 2021-01-10 ENCOUNTER — Ambulatory Visit: Payer: Medicare PPO | Admitting: Adult Health

## 2021-01-10 VITALS — BP 114/72 | HR 56 | Temp 97.7°F | Ht 63.5 in | Wt 152.0 lb

## 2021-01-10 DIAGNOSIS — I471 Supraventricular tachycardia, unspecified: Secondary | ICD-10-CM

## 2021-01-10 DIAGNOSIS — N368 Other specified disorders of urethra: Secondary | ICD-10-CM

## 2021-01-10 DIAGNOSIS — I10 Essential (primary) hypertension: Secondary | ICD-10-CM

## 2021-01-10 DIAGNOSIS — E782 Mixed hyperlipidemia: Secondary | ICD-10-CM

## 2021-01-10 DIAGNOSIS — R6889 Other general symptoms and signs: Secondary | ICD-10-CM | POA: Diagnosis not present

## 2021-01-10 DIAGNOSIS — I4510 Unspecified right bundle-branch block: Secondary | ICD-10-CM

## 2021-01-10 DIAGNOSIS — E538 Deficiency of other specified B group vitamins: Secondary | ICD-10-CM

## 2021-01-10 DIAGNOSIS — N183 Chronic kidney disease, stage 3 unspecified: Secondary | ICD-10-CM

## 2021-01-10 DIAGNOSIS — G250 Essential tremor: Secondary | ICD-10-CM | POA: Diagnosis not present

## 2021-01-10 DIAGNOSIS — N281 Cyst of kidney, acquired: Secondary | ICD-10-CM | POA: Diagnosis not present

## 2021-01-10 DIAGNOSIS — E559 Vitamin D deficiency, unspecified: Secondary | ICD-10-CM

## 2021-01-10 DIAGNOSIS — M810 Age-related osteoporosis without current pathological fracture: Secondary | ICD-10-CM | POA: Diagnosis not present

## 2021-01-10 DIAGNOSIS — Z79899 Other long term (current) drug therapy: Secondary | ICD-10-CM | POA: Diagnosis not present

## 2021-01-10 DIAGNOSIS — Z8542 Personal history of malignant neoplasm of other parts of uterus: Secondary | ICD-10-CM | POA: Diagnosis not present

## 2021-01-10 DIAGNOSIS — Z0000 Encounter for general adult medical examination without abnormal findings: Secondary | ICD-10-CM

## 2021-01-10 DIAGNOSIS — Z85828 Personal history of other malignant neoplasm of skin: Secondary | ICD-10-CM

## 2021-01-10 DIAGNOSIS — R7309 Other abnormal glucose: Secondary | ICD-10-CM | POA: Diagnosis not present

## 2021-01-10 DIAGNOSIS — Z86718 Personal history of other venous thrombosis and embolism: Secondary | ICD-10-CM

## 2021-01-10 DIAGNOSIS — Z0001 Encounter for general adult medical examination with abnormal findings: Secondary | ICD-10-CM | POA: Diagnosis not present

## 2021-01-10 DIAGNOSIS — Z8673 Personal history of transient ischemic attack (TIA), and cerebral infarction without residual deficits: Secondary | ICD-10-CM

## 2021-01-10 DIAGNOSIS — Z6826 Body mass index (BMI) 26.0-26.9, adult: Secondary | ICD-10-CM

## 2021-01-10 NOTE — Patient Instructions (Signed)
Monica Curry , Thank you for taking time to come for your Medicare Wellness Visit. I appreciate your ongoing commitment to your health goals. Please review the following plan we discussed and let me know if I can assist you in the future.   These are the goals we discussed: Goals    . Blood Pressure < 150/90    . DIET - INCREASE WATER INTAKE     Aim for 65 fluid ounces of water or clear liquids daily     . Exercise 150 min/wk Moderate Activity    . LDL CALC < 70       This is a list of the screening recommended for you and due dates:  Health Maintenance  Topic Date Due  . Mammogram  11/29/2020  . Tetanus Vaccine  03/24/2023  . Flu Shot  Completed  . DEXA scan (bone density measurement)  Completed  . COVID-19 Vaccine  Completed  . Pneumonia vaccines  Discontinued     Back Exercises These exercises help to make your trunk and back strong. They also help to keep the lower back flexible. Doing these exercises can help to prevent back pain or lessen existing pain.  If you have back pain, try to do these exercises 2-3 times each day or as told by your doctor.  As you get better, do the exercises once each day. Repeat the exercises more often as told by your doctor.  To stop back pain from coming back, do the exercises once each day, or as told by your doctor. Exercises Single knee to chest Do these steps 3-5 times in a row for each leg: 1. Lie on your back on a firm bed or the floor with your legs stretched out. 2. Bring one knee to your chest. 3. Grab your knee or thigh with both hands and hold them it in place. 4. Pull on your knee until you feel a gentle stretch in your lower back or buttocks. 5. Keep doing the stretch for 10-30 seconds. 6. Slowly let go of your leg and straighten it. Pelvic tilt Do these steps 5-10 times in a row: 1. Lie on your back on a firm bed or the floor with your legs stretched out. 2. Bend your knees so they point up to the ceiling. Your feet  should be flat on the floor. 3. Tighten your lower belly (abdomen) muscles to press your lower back against the floor. This will make your tailbone point up to the ceiling instead of pointing down to your feet or the floor. 4. Stay in this position for 5-10 seconds while you gently tighten your muscles and breathe evenly. Cat-cow Do these steps until your lower back bends more easily: 1. Get on your hands and knees on a firm surface. Keep your hands under your shoulders, and keep your knees under your hips. You may put padding under your knees. 2. Let your head hang down toward your chest. Tighten (contract) the muscles in your belly. Point your tailbone toward the floor so your lower back becomes rounded like the back of a cat. 3. Stay in this position for 5 seconds. 4. Slowly lift your head. Let the muscles of your belly relax. Point your tailbone up toward the ceiling so your back forms a sagging arch like the back of a cow. 5. Stay in this position for 5 seconds.   Press-ups Do these steps 5-10 times in a row: 1. Lie on your belly (face-down) on the floor. 2.  Place your hands near your head, about shoulder-width apart. 3. While you keep your back relaxed and keep your hips on the floor, slowly straighten your arms to raise the top half of your body and lift your shoulders. Do not use your back muscles. You may change where you place your hands in order to make yourself more comfortable. 4. Stay in this position for 5 seconds. 5. Slowly return to lying flat on the floor.   Bridges Do these steps 10 times in a row: 1. Lie on your back on a firm surface. 2. Bend your knees so they point up to the ceiling. Your feet should be flat on the floor. Your arms should be flat at your sides, next to your body. 3. Tighten your butt muscles and lift your butt off the floor until your waist is almost as high as your knees. If you do not feel the muscles working in your butt and the back of your thighs,  slide your feet 1-2 inches farther away from your butt. 4. Stay in this position for 3-5 seconds. 5. Slowly lower your butt to the floor, and let your butt muscles relax. If this exercise is too easy, try doing it with your arms crossed over your chest.   Belly crunches Do these steps 5-10 times in a row: 1. Lie on your back on a firm bed or the floor with your legs stretched out. 2. Bend your knees so they point up to the ceiling. Your feet should be flat on the floor. 3. Cross your arms over your chest. 4. Tip your chin a little bit toward your chest but do not bend your neck. 5. Tighten your belly muscles and slowly raise your chest just enough to lift your shoulder blades a tiny bit off of the floor. Avoid raising your body higher than that, because it can put too much stress on your low back. 6. Slowly lower your chest and your head to the floor. Back lifts Do these steps 5-10 times in a row: 1. Lie on your belly (face-down) with your arms at your sides, and rest your forehead on the floor. 2. Tighten the muscles in your legs and your butt. 3. Slowly lift your chest off of the floor while you keep your hips on the floor. Keep the back of your head in line with the curve in your back. Look at the floor while you do this. 4. Stay in this position for 3-5 seconds. 5. Slowly lower your chest and your face to the floor. Contact a doctor if:  Your back pain gets a lot worse when you do an exercise.  Your back pain does not get better 2 hours after you exercise. If you have any of these problems, stop doing the exercises. Do not do them again unless your doctor says it is okay. Get help right away if:  You have sudden, very bad back pain. If this happens, stop doing the exercises. Do not do them again unless your doctor says it is okay. This information is not intended to replace advice given to you by your health care provider. Make sure you discuss any questions you have with your health  care provider. Document Revised: 08/20/2018 Document Reviewed: 08/20/2018 Elsevier Patient Education  2021 [employer].

## 2021-01-11 LAB — CBC WITH DIFFERENTIAL/PLATELET
Absolute Monocytes: 608 cells/uL (ref 200–950)
Basophils Absolute: 68 cells/uL (ref 0–200)
Basophils Relative: 1.1 %
Eosinophils Absolute: 205 cells/uL (ref 15–500)
Eosinophils Relative: 3.3 %
HCT: 46.2 % — ABNORMAL HIGH (ref 35.0–45.0)
Hemoglobin: 15.3 g/dL (ref 11.7–15.5)
Lymphs Abs: 2251 cells/uL (ref 850–3900)
MCH: 29.1 pg (ref 27.0–33.0)
MCHC: 33.1 g/dL (ref 32.0–36.0)
MCV: 87.8 fL (ref 80.0–100.0)
MPV: 10.2 fL (ref 7.5–12.5)
Monocytes Relative: 9.8 %
Neutro Abs: 3069 cells/uL (ref 1500–7800)
Neutrophils Relative %: 49.5 %
Platelets: 339 10*3/uL (ref 140–400)
RBC: 5.26 10*6/uL — ABNORMAL HIGH (ref 3.80–5.10)
RDW: 13.8 % (ref 11.0–15.0)
Total Lymphocyte: 36.3 %
WBC: 6.2 10*3/uL (ref 3.8–10.8)

## 2021-01-11 LAB — LIPID PANEL
Cholesterol: 116 mg/dL (ref <200)
HDL: 50 mg/dL (ref >=50)
LDL Cholesterol (Calc): 47 mg/dL (calc)
Non-HDL Cholesterol (Calc): 66 mg/dL (calc) (ref <130)
Total CHOL/HDL Ratio: 2.3 (calc) (ref <5.0)
Triglycerides: 104 mg/dL (ref <150)

## 2021-01-11 LAB — COMPLETE METABOLIC PANEL WITH GFR
AG Ratio: 2 (calc) (ref 1.0–2.5)
ALT: 13 U/L (ref 6–29)
AST: 14 U/L (ref 10–35)
Albumin: 4.2 g/dL (ref 3.6–5.1)
Alkaline phosphatase (APISO): 39 U/L (ref 37–153)
BUN/Creatinine Ratio: 17 (calc) (ref 6–22)
BUN: 22 mg/dL (ref 7–25)
CO2: 27 mmol/L (ref 20–32)
Calcium: 9.9 mg/dL (ref 8.6–10.4)
Chloride: 104 mmol/L (ref 98–110)
Creat: 1.26 mg/dL — ABNORMAL HIGH (ref 0.60–0.88)
GFR, Est African American: 44 mL/min/{1.73_m2} — ABNORMAL LOW (ref >=60)
GFR, Est Non African American: 38 mL/min/{1.73_m2} — ABNORMAL LOW (ref >=60)
Globulin: 2.1 g/dL (calc) (ref 1.9–3.7)
Glucose, Bld: 94 mg/dL (ref 65–99)
Potassium: 5 mmol/L (ref 3.5–5.3)
Sodium: 138 mmol/L (ref 135–146)
Total Bilirubin: 0.5 mg/dL (ref 0.2–1.2)
Total Protein: 6.3 g/dL (ref 6.1–8.1)

## 2021-01-11 LAB — TSH: TSH: 2.18 mIU/L (ref 0.40–4.50)

## 2021-01-11 LAB — MAGNESIUM: Magnesium: 2 mg/dL (ref 1.5–2.5)

## 2021-01-23 ENCOUNTER — Encounter: Payer: Self-pay | Admitting: Internal Medicine

## 2021-02-15 ENCOUNTER — Other Ambulatory Visit: Payer: Self-pay

## 2021-02-15 DIAGNOSIS — G250 Essential tremor: Secondary | ICD-10-CM

## 2021-02-15 DIAGNOSIS — I10 Essential (primary) hypertension: Secondary | ICD-10-CM

## 2021-02-15 MED ORDER — PROPRANOLOL HCL 40 MG PO TABS: ORAL_TABLET | ORAL | 3 refills | Status: DC

## 2021-02-20 ENCOUNTER — Other Ambulatory Visit: Payer: Self-pay | Admitting: Adult Health

## 2021-02-20 DIAGNOSIS — E782 Mixed hyperlipidemia: Secondary | ICD-10-CM

## 2021-02-27 DIAGNOSIS — N1832 Chronic kidney disease, stage 3b: Secondary | ICD-10-CM | POA: Diagnosis not present

## 2021-02-28 ENCOUNTER — Ambulatory Visit: Payer: Medicare PPO | Admitting: Adult Health

## 2021-03-07 DIAGNOSIS — I129 Hypertensive chronic kidney disease with stage 1 through stage 4 chronic kidney disease, or unspecified chronic kidney disease: Secondary | ICD-10-CM | POA: Diagnosis not present

## 2021-03-07 DIAGNOSIS — D631 Anemia in chronic kidney disease: Secondary | ICD-10-CM | POA: Diagnosis not present

## 2021-03-07 DIAGNOSIS — N1832 Chronic kidney disease, stage 3b: Secondary | ICD-10-CM | POA: Diagnosis not present

## 2021-03-07 DIAGNOSIS — N281 Cyst of kidney, acquired: Secondary | ICD-10-CM | POA: Diagnosis not present

## 2021-03-07 DIAGNOSIS — N2581 Secondary hyperparathyroidism of renal origin: Secondary | ICD-10-CM | POA: Diagnosis not present

## 2021-03-12 ENCOUNTER — Other Ambulatory Visit: Payer: Self-pay | Admitting: Adult Health

## 2021-04-06 DIAGNOSIS — Z03818 Encounter for observation for suspected exposure to other biological agents ruled out: Secondary | ICD-10-CM | POA: Diagnosis not present

## 2021-04-06 DIAGNOSIS — Z20822 Contact with and (suspected) exposure to covid-19: Secondary | ICD-10-CM | POA: Diagnosis not present

## 2021-04-09 NOTE — Progress Notes (Signed)
Future Appointments  Date Time Provider [place]  04/10/2021  2:30 PM Unk Pinto, MD [facility] None  04/17/2021  3:20 PM Fay Records, MD [facility]  08/14/2021  2:00 PM [name], NP [facility] None  10/02/2021 11:00 AM [name], NP [facility] None  01/10/2022  2:30 PM [name], NP [facility] None    History of Present Illness:       This very nice 87 y.o.  WWF presents for 3 month follow up with HTN, HLD,  Familial / Essential Hereditary Tremor, Pre-Diabetes and Vitamin D Deficiency.        Patient is treated for HTN & BP has been controlled at home. Today's BP is at goal -  120/68.  In 2018, patient had a Stroke with an expressive dysphasia which recovered w/o sequela.  In 2019, she had a negative Cardiolite stress test.  Patient has hx/o CKD3b, hx/pSVT,  remote DVT , & hx/o PE and on recommended LD bASA & Statin. Patient has had no complaints of any cardiac type chest pain, palpitations, dyspnea / orthopnea / PND, dizziness, claudication, or dependent edema.       Hyperlipidemia is controlled with diet & meds. Patient denies myalgias or other med SE's. Last Lipids were at goal:  Lab Results  Component Value Date   CHOL 116 01/10/2021   HDL 50 01/10/2021   LDLCALC 47 01/10/2021   TRIG 104 01/10/2021   CHOLHDL 2.3 01/10/2021    Also, the patient has history of PreDiabetes  (A1c 5.7% /2016) and has had no symptoms of reactive hypoglycemia, diabetic polys, paresthesias or visual blurring.  Last A1c was normal & at goal:  Lab Results  Component Value Date   HGBA1C 5.4 06/29/2020             Further, the patient also has history of Vitamin D Deficiency and supplements vitamin D without any suspected side-effects. Last vitamin D was at goal:  Lab Results  Component Value Date   VD25OH 15 06/29/2020    Current Outpatient Medications on File Prior to Visit  Medication Sig  . aspirin 325 MG tablet Take 325 mg by mouth daily.  Marland Kitchen  atorvastatin 10 MG tablet TAKE 1 TABLET DAILY FOR CHOLESTEROL  . Calcium-Magnesium-Vitamin D 300-20-200 MG-MG-UNIT  Chew 1 tablet daily .   Marland Kitchen VITAMIN D 5000 units TABS Take 2 tablets daily.   . clonazePAM  0.5 MG tablet TAKE 1 TABLET  EVERY MORNING AND 1/2 TABLET AT NOON AND 1/2 AT NIGHT  . Magnesium 250 MG TABS Take 2 (two) times a day.  Marland Kitchen Propranolol 40 MG tablet TAKE 1 TABLET  FOUR TIMES DAILY WITH MEALS AND BEDTIME FOR BLOOD PRESSURE AND TREMOR  . topiramate (25 MG tablet TAKE 1 TABLETAT BEDTIME  . vitamin B-12 500 MCG Sublingual Take daily.    No current facility-administered medications on file prior to visit.    Allergies  Allergen Reactions  . Codeine Hives  . Hctz [Hydrochlorothiazide] Other (See Comments)    Low sodium  . Meloxicam Nausea Only    Gastritis and nausea  . Mysoline [Primidone]   . Pneumovax 23 [Pneumococcal Vac Polyvalent]     Red, raised area.  Knot at site of injection  . Toradol [Ketorolac Tromethamine] Swelling    Localized redness at injection site.    . Ace Inhibitors Cough    PMHx:   Past Medical History:  Diagnosis Date  . C. difficile colitis   .  Cancer [facility])    Endometrial cancer  . Essential and other specified forms of tremor   . Osteoporosis 11/2019   T score -2.8 stable from prior DEXA  . Personal history of venous thrombosis and embolism 1997  . Shingles   . Stroke ([place])   . Thrush   . TIA (transient ischemic attack)     Immunization History  Administered Date(s) Administered  . H1N1 01/03/2009  . Influenza Whole 09/23/2013  . Influenza, High Dose Seasonal PF 09/06/2014, 08/23/2015, 08/21/2016, 08/28/2017, 09/01/2018, 09/14/2019, 09/19/2020  . PFIZER(Purple Top)SARS-COV-2 Vaccination 12/29/2019, 01/16/2020, 09/06/2020  . PPD Test 03/29/2014  . Pneumococcal Polysaccharide-23 03/29/2014  . Pneumococcal-Unspecified 12/09/2002  . Td 03/23/2013  . Zoster 12/09/2006    Past Surgical History:  Procedure Laterality Date  .  APPENDECTOMY  1961  . cataract surg Bilateral 2021   Dr. Gershon Crane, R eye 8/18, L 8/25  . [place]  . TONSILLECTOMY AND ADENOIDECTOMY    . TOTAL ABDOMINAL HYSTERECTOMY W/ BILATERAL SALPINGOOPHORECTOMY  1997    FHx:    Reviewed / unchanged  SHx:    Reviewed / unchanged   Systems Review:  Constitutional: Denies fever, chills, wt changes, headaches, insomnia, fatigue, night sweats, change in appetite. Eyes: Denies redness, blurred vision, diplopia, discharge, itchy, watery eyes.  ENT: Denies discharge, congestion, post nasal drip, epistaxis, sore throat, earache, hearing loss, dental pain, tinnitus, vertigo, sinus pain, snoring.  CV: Denies chest pain, palpitations, irregular heartbeat, syncope, dyspnea, diaphoresis, orthopnea, PND, claudication or edema. Respiratory: denies cough, dyspnea, DOE, pleurisy, hoarseness, laryngitis, wheezing.  Gastrointestinal: Denies dysphagia, odynophagia, heartburn, reflux, water brash, abdominal pain or cramps, nausea, vomiting, bloating, diarrhea, constipation, hematemesis, melena, hematochezia  or hemorrhoids. Genitourinary: Denies dysuria, frequency, urgency, nocturia, hesitancy, discharge, hematuria or flank pain. Musculoskeletal: Denies arthralgias, myalgias, stiffness, jt. swelling, pain, limping or strain/sprain.  Skin: Denies pruritus, rash, hives, warts, acne, eczema or change in skin lesion(s). Neuro: No weakness, tremor, incoordination, spasms, paresthesia or pain. Psychiatric: Denies confusion, memory loss or sensory loss. Endo: Denies change in weight, skin or hair change.  Heme/Lymph: No excessive bleeding, bruising or enlarged lymph nodes.  Physical Exam  BP 120/68   Pulse (!) 54   Temp 97.7 F (36.5 C)   Resp 16   Ht 5\' 4"  (1.626 m)   Wt 150 lb (68 kg)   SpO2 97%   BMI 25.75 kg/m   Appears  well nourished, well groomed  and in no distress.  Eyes: PERRLA, EOMs, conjunctiva no swelling or erythema. Sinuses: No  frontal/maxillary tenderness ENT/Mouth: EAC's clear, TM's nl w/o erythema, bulging. Nares clear w/o erythema, swelling, exudates. Oropharynx clear without erythema or exudates. Oral hygiene is good. Tongue normal, non obstructing. Hearing intact.  Neck: Supple. Thyroid not palpable. Car 2+/2+ without bruits, nodes or JVD. Chest: Respirations nl with BS clear & equal w/o rales, rhonchi, wheezing or stridor.  Cor: Heart sounds normal w/ regular rate and rhythm without sig. murmurs, gallops, clicks or rubs. Peripheral pulses normal and equal  without edema.  Abdomen: Soft & bowel sounds normal. Non-tender w/o guarding, rebound, hernias, masses or organomegaly.  Lymphatics: Unremarkable.  Musculoskeletal: Full ROM all peripheral extremities, joint stability, 5/5 strength and normal gait.  Skin: Warm, dry without exposed rashes, lesions or ecchymosis apparent.  Neuro: Cranial nerves intact, reflexes equal bilaterally. Sensory-motor testing grossly intact. Tendon reflexes grossly intact.  Pysch: Alert & oriented x 3.  Insight and judgement nl & appropriate. No ideations.  Assessment and Plan:  1. Essential hypertension  - Continue medication, monitor blood pressure at home.  - Continue DASH diet.  Reminder to go to the ER if any CP,  SOB, nausea, dizziness, severe HA, changes vision/speech.  - CBC with Differential/Platelet - COMPLETE METABOLIC PANEL WITH GFR - Magnesium - TSH  2. Hyperlipidemia, mixed  - Continue diet/meds, exercise,& lifestyle modifications.  - Continue monitor periodic cholesterol/liver & renal functions   - Lipid panel - TSH  3. Abnormal glucose  - Continue diet, exercise  - Lifestyle modifications.  - Monitor appropriate labs  - Hemoglobin A1c - Insulin, random  4. Vitamin D deficiency  - Continue supplementation.  - VITAMIN D 25 Hydroxy   5. Chronic renal impairment, stage 3b (HCC)  - COMPLETE METABOLIC PANEL WITH GFR  6. Essential tremor   7.  Medication management  - CBC with Differential/Platelet - COMPLETE METABOLIC PANEL WITH GFR - Magnesium - Lipid panel - TSH - Hemoglobin A1c - Insulin, random - VITAMIN D 25 Hydroxy        Discussed  regular exercise, BP monitoring, weight control to achieve/maintain BMI less than 25 and discussed med and SE's. Recommended labs to assess and monitor clinical status with further disposition pending results of labs.  I discussed the assessment and treatment plan with the patient. The patient was provided an opportunity to ask questions and all were answered. The patient agreed with the plan and demonstrated an understanding of the instructions.  I provided over 30 minutes of exam, counseling, chart review and  complex critical decision making.         The patient was advised to call back or seek an in-person evaluation if the symptoms worsen or if the condition fails to improve as anticipated.   Kirtland Bouchard, MD

## 2021-04-10 ENCOUNTER — Ambulatory Visit: Payer: Medicare PPO | Admitting: Internal Medicine

## 2021-04-10 ENCOUNTER — Other Ambulatory Visit: Payer: Self-pay

## 2021-04-10 ENCOUNTER — Encounter: Payer: Self-pay | Admitting: Internal Medicine

## 2021-04-10 VITALS — BP 120/68 | HR 54 | Temp 97.7°F | Resp 16 | Ht 64.0 in | Wt 150.0 lb

## 2021-04-10 DIAGNOSIS — I10 Essential (primary) hypertension: Secondary | ICD-10-CM | POA: Diagnosis not present

## 2021-04-10 DIAGNOSIS — G250 Essential tremor: Secondary | ICD-10-CM | POA: Diagnosis not present

## 2021-04-10 DIAGNOSIS — E559 Vitamin D deficiency, unspecified: Secondary | ICD-10-CM | POA: Diagnosis not present

## 2021-04-10 DIAGNOSIS — Z79899 Other long term (current) drug therapy: Secondary | ICD-10-CM | POA: Diagnosis not present

## 2021-04-10 DIAGNOSIS — R7309 Other abnormal glucose: Secondary | ICD-10-CM | POA: Diagnosis not present

## 2021-04-10 DIAGNOSIS — N1832 Chronic kidney disease, stage 3b: Secondary | ICD-10-CM | POA: Diagnosis not present

## 2021-04-10 DIAGNOSIS — E782 Mixed hyperlipidemia: Secondary | ICD-10-CM

## 2021-04-10 NOTE — Patient Instructions (Signed)

## 2021-04-11 LAB — COMPLETE METABOLIC PANEL WITH GFR
AG Ratio: 2.2 (calc) (ref 1.0–2.5)
ALT: 11 U/L (ref 6–29)
AST: 17 U/L (ref 10–35)
Albumin: 4.3 g/dL (ref 3.6–5.1)
Alkaline phosphatase (APISO): 37 U/L (ref 37–153)
BUN/Creatinine Ratio: 19 (calc) (ref 6–22)
BUN: 26 mg/dL — ABNORMAL HIGH (ref 7–25)
CO2: 24 mmol/L (ref 20–32)
Calcium: 9.9 mg/dL (ref 8.6–10.4)
Chloride: 103 mmol/L (ref 98–110)
Creat: 1.39 mg/dL — ABNORMAL HIGH (ref 0.60–0.88)
GFR, Est African American: 39 mL/min/{1.73_m2} — ABNORMAL LOW (ref >=60)
GFR, Est Non African American: 34 mL/min/{1.73_m2} — ABNORMAL LOW (ref >=60)
Globulin: 2 g/dL (calc) (ref 1.9–3.7)
Glucose, Bld: 85 mg/dL (ref 65–99)
Potassium: 4.8 mmol/L (ref 3.5–5.3)
Sodium: 136 mmol/L (ref 135–146)
Total Bilirubin: 0.6 mg/dL (ref 0.2–1.2)
Total Protein: 6.3 g/dL (ref 6.1–8.1)

## 2021-04-11 LAB — CBC WITH DIFFERENTIAL/PLATELET
Absolute Monocytes: 521 cells/uL (ref 200–950)
Basophils Absolute: 73 cells/uL (ref 0–200)
Basophils Relative: 1.3 %
Eosinophils Absolute: 202 cells/uL (ref 15–500)
Eosinophils Relative: 3.6 %
HCT: 45.5 % — ABNORMAL HIGH (ref 35.0–45.0)
Hemoglobin: 15 g/dL (ref 11.7–15.5)
Lymphs Abs: 2408 cells/uL (ref 850–3900)
MCH: 29 pg (ref 27.0–33.0)
MCHC: 33 g/dL (ref 32.0–36.0)
MCV: 87.8 fL (ref 80.0–100.0)
MPV: 9.9 fL (ref 7.5–12.5)
Monocytes Relative: 9.3 %
Neutro Abs: 2397 cells/uL (ref 1500–7800)
Neutrophils Relative %: 42.8 %
Platelets: 332 10*3/uL (ref 140–400)
RBC: 5.18 10*6/uL — ABNORMAL HIGH (ref 3.80–5.10)
RDW: 14.3 % (ref 11.0–15.0)
Total Lymphocyte: 43 %
WBC: 5.6 10*3/uL (ref 3.8–10.8)

## 2021-04-11 LAB — LIPID PANEL
Cholesterol: 117 mg/dL (ref <200)
HDL: 47 mg/dL — ABNORMAL LOW (ref >=50)
LDL Cholesterol (Calc): 52 mg/dL (calc)
Non-HDL Cholesterol (Calc): 70 mg/dL (calc) (ref <130)
Total CHOL/HDL Ratio: 2.5 (calc) (ref <5.0)
Triglycerides: 94 mg/dL (ref <150)

## 2021-04-11 LAB — HEMOGLOBIN A1C
Hgb A1c MFr Bld: 5.4 % of total Hgb (ref <5.7)
Mean Plasma Glucose: 108 mg/dL
eAG (mmol/L): 6 mmol/L

## 2021-04-11 LAB — MAGNESIUM: Magnesium: 2.1 mg/dL (ref 1.5–2.5)

## 2021-04-11 LAB — VITAMIN D 25 HYDROXY (VIT D DEFICIENCY, FRACTURES): Vit D, 25-Hydroxy: 88 ng/mL (ref 30–100)

## 2021-04-11 LAB — TSH: TSH: 1.31 mIU/L (ref 0.40–4.50)

## 2021-04-11 LAB — INSULIN, RANDOM: Insulin: 9.4 u[IU]/mL

## 2021-04-12 ENCOUNTER — Encounter: Payer: Self-pay | Admitting: *Deleted

## 2021-04-15 NOTE — Progress Notes (Signed)
+   Cardiology Office Note   Date:  0/93/2671   ID:  Monica Curry, DOB [birth year], MRN [id number]  PCP:  Unk Pinto, MD  Cardiologist:   Dorris Carnes, MD   F/U of HTN     History of Present Illness: Monica Curry is a 87 y.o. female with a history of a TIA with expressive aphasia, HTN,  SVT (on event monitor; placed on diltiazem) CKD stage III, hx of pulmonary embolism, DVT, essential tremors and presyncope     I saw the pt in clinic in July 2021  Since I saw her she denies palpitations, no TIA like symptoms    She has had some funny feeling in head  Dizzy   She feels unsteady    Some good days but then will feel bad   Pt says she had occasionally felt like going to back out    Hs  syncope   She notes occasional tightness in chest   Not associated with activity   Pt notes some unsteadiness on feet No syncope   Has  good days then has had some bad days   She also hs felt a little tight in chest    A lttle lkke going to black out but has not had full syncope   Current Meds  Medication Sig  . aspirin EC 81 MG tablet Take 1 tablet (81 mg total) by mouth daily. Swallow whole.  Marland Kitchen atorvastatin (LIPITOR) 10 MG tablet TAKE 1 TABLET BY MOUTH DAILY FOR CHOLESTEROL  . Calcium-Magnesium-Vitamin D 300-20-200 MG-MG-UNIT CHEW Chew 1 tablet daily by mouth.   . Cholecalciferol (VITAMIN D3) 5000 units TABS Take 2 tablets by mouth daily.   . clonazePAM (KLONOPIN) 0.5 MG tablet TAKE 1 TABLET BY MOUTH EVERY MORNING AND 1/2 TABLET AT NOON AND 1/2 AT NIGHT  . Magnesium 250 MG TABS Take 250 mg by mouth 2 (two) times a day.  . olmesartan (BENICAR) 5 MG tablet Take 1 tablet by mouth daily as needed. For blood pressure only if 150/90+  . propranolol (INDERAL) 40 MG tablet TAKE 1 TABLET BY MOUTH FOUR TIMES DAILY WITH MEALS AND BEDTIME FOR BLOOD PRESSURE AND TREMOR  . topiramate (TOPAMAX) 25 MG tablet TAKE 1 TABLET(25 MG) BY MOUTH AT BEDTIME  . vitamin B-12 (CYANOCOBALAMIN) 500 MCG tablet Take 500 mcg  by mouth daily. Sublingual  . [DISCONTINUED] aspirin 325 MG tablet Take 325 mg by mouth daily.     Allergies:   Codeine, Hctz [hydrochlorothiazide], Meloxicam, Mysoline [primidone], Pneumovax 23 [pneumococcal vac polyvalent], Toradol [ketorolac tromethamine], and Ace inhibitors   Past Medical History:  Diagnosis Date  . C. difficile colitis   . Cancer [facility])    Endometrial cancer  . Essential and other specified forms of tremor   . Osteoporosis 11/2019   T score -2.8 stable from prior DEXA  . Personal history of venous thrombosis and embolism 1997  . Shingles   . Stroke ([place])   . Thrush   . TIA (transient ischemic attack)     Past Surgical History:  Procedure Laterality Date  . APPENDECTOMY  1961  . cataract surg Bilateral 2021   Dr. Gershon Crane, R eye 8/18, L 8/25  . [place]  . TONSILLECTOMY AND ADENOIDECTOMY    . TOTAL ABDOMINAL HYSTERECTOMY W/ BILATERAL SALPINGOOPHORECTOMY  1997     Social History:  The patient  reports that she has never smoked. She has never used smokeless tobacco. She reports previous alcohol use. She reports that  she does not use drugs.   Family History:  The patient's family history includes Blindness (age of onset: 40) in her sister; Cancer in her father and mother; Dementia in her mother; Diabetes in her sister; Heart disease in her brother, brother, brother, sister, and sister; Hypertension in her sister; Kidney failure in her brother and sister; Prostate cancer in her brother and brother; Skin cancer in her sister.    ROS:  Please see the history of present illness. All other systems are reviewed and  Negative to the above problem except as noted.    PHYSICAL EXAM: VS:  BP 136/80   Pulse 66   Ht 5\' 4"  (1.626 m)   Wt 150 lb 9.6 oz (68.3 kg)   BMI 25.85 kg/m   GEN: Well nourished, well developed in no acute distress  HEENT: normal  Neck: no JVD, no carotid bruits Cardiac: RRR; no murmurs.  No LE edema  Respiratory:  clear to  auscultation bilaterally GI: soft, nontender, nondistended, + BS  No hepatomegaly  MS: no deformity Moving all extremities   Skin: warm and dry Neuro:  Strength and sensation are intact Psych: euthymic mood, full affect   EKG:  EKG is  ordered today.  SR  66  RBBB   First degred AV block   Lipid Panel    Component Value Date/Time   CHOL 117 04/10/2021 1426   TRIG 94 04/10/2021 1426   HDL 47 (L) 04/10/2021 1426   CHOLHDL 2.5 04/10/2021 1426   VLDL 24 10/24/2017 0629   LDLCALC 52 04/10/2021 1426      Wt Readings from Last 3 Encounters:  04/17/21 150 lb 9.6 oz (68.3 kg)  04/10/21 150 lb (68 kg)  01/10/21 152 lb (68.9 kg)    Additional studies/ records that were reviewed today include:   Stress test 04/2018  Nuclear stress EF: 76%.  There was no ST segment deviation noted during stress.  No T wave inversion was noted during stress.  The study is normal.  This is a low risk study.  The left ventricular ejection fraction is hyperdynamic (>65%).  Low risk stress nuclear study with normal perfusion and normal left ventricular regional and global systolic function.  Monitor 01/2018 NSR with sinus bradycardia 2. Atrial tachycardia at 160 is present 3. Rare PVC's 4. Noise artifact  Echo 10/2017 Study Conclusions  - Left ventricle: The cavity size was normal. Systolic function was normal. The estimated ejection fraction was in the range of 55% to 60%. Wall motion was normal; there were no regional wall motion abnormalities. Doppler parameters are consistent with abnormal left ventricular relaxation (grade 1 diastolic dysfunction). - Mitral valve: There was mild regurgitation. - Left atrium: The atrium was moderately dilated.  Impressions:  - No cardiac source of emboli was indentified.   ASSESSMENT AND PLAN:  1   HTN  BP is fair today  I would continue   I would not push further giving age and unsteadiness   2  HL   Keep on lipitor  Recent  labs LDL 52  HDL 47    3   Hx SVT  I am not convinced of an arrhythma  4  Dizziness  Will refer to balance rehab   4   Hx TIA Keep on ASA and lipitor     F/U in 9 months   Current medicines are reviewed at length with the patient today.  The patient does not have concerns regarding medicines.  Signed, Dorris Carnes,  MD  04/17/2021 11:50 PM    [address], [place], [place]  [address] Phone: [phone]; Fax: [phone]

## 2021-04-17 ENCOUNTER — Encounter: Payer: Self-pay | Admitting: Internal Medicine

## 2021-04-17 ENCOUNTER — Other Ambulatory Visit: Payer: Self-pay

## 2021-04-17 ENCOUNTER — Ambulatory Visit: Payer: Medicare PPO | Admitting: Internal Medicine

## 2021-04-17 VITALS — BP 136/80 | HR 66 | Ht 64.0 in | Wt 150.6 lb

## 2021-04-17 DIAGNOSIS — I10 Essential (primary) hypertension: Secondary | ICD-10-CM | POA: Diagnosis not present

## 2021-04-17 DIAGNOSIS — R42 Dizziness and giddiness: Secondary | ICD-10-CM

## 2021-04-17 MED ORDER — ASPIRIN EC 81 MG PO TBEC: 81.0000 mg | DELAYED_RELEASE_TABLET | Freq: Every day | ORAL | 3 refills | Status: DC

## 2021-04-17 NOTE — Patient Instructions (Addendum)
Medication Instructions:  Change aspirin to 81 mg daily  *If you need a refill on your cardiac medications before your next appointment, please call your pharmacy*   Lab Work: none If you have labs (blood work) drawn today and your tests are completely normal, you will receive your results only by: Marland Kitchen MyChart Message (if you have MyChart) OR . A paper copy in the mail If you have any lab test that is abnormal or we need to change your treatment, we will call you to review the results.   Testing/Procedures: none   Follow-Up: At [facility], you and your health needs are our priority.  As part of our continuing mission to provide you with exceptional heart care, we have created designated Provider Care Teams.  These Care Teams include your primary Cardiologist (physician) and Advanced Practice Providers (APPs -  Physician Assistants and Nurse Practitioners) who all work together to provide you with the care you need, when you need it.  We recommend signing up for the patient portal called "MyChart".  Sign up information is provided on this After Visit Summary.  MyChart is used to connect with patients for Virtual Visits (Telemedicine).  Patients are able to view lab/test results, encounter notes, upcoming appointments, etc.  Non-urgent messages can be sent to your provider as well.   To learn more about what you can do with MyChart, go to [email or web address].    Your next appointment:   9 month(s)  The format for your next appointment:   In Person  Provider:   You may see Dorris Carnes, MD or one of the following Advanced Practice Providers on your designated Care Team:    Richardson Dopp, PA-C  Robbie Lis, [place]   Other Instructions You have been referred to balance/vestibular rehab for balance/dizziness.

## 2021-05-03 ENCOUNTER — Other Ambulatory Visit: Payer: Self-pay

## 2021-05-03 ENCOUNTER — Ambulatory Visit: Payer: Medicare PPO | Attending: Internal Medicine

## 2021-05-03 DIAGNOSIS — R2681 Unsteadiness on feet: Secondary | ICD-10-CM

## 2021-05-03 DIAGNOSIS — R42 Dizziness and giddiness: Secondary | ICD-10-CM

## 2021-05-03 NOTE — Therapy (Signed)
OUTPATIENT PHYSICAL THERAPY VESTIBULAR EVALUATION    Patient Name: Monica Curry MRN: [id number] DOB:1931/04/02, 87 y.o., female Today's Date: 05/03/2021  PCP: Unk Pinto, MD REFERRING PROVIDER: Fay Records, MD   PT End of Session - 05/03/21 1059    Visit Number 1    Number of Visits 7    Date for PT Re-Evaluation 07/02/21   POC for 6 weeks, Cert for 60 days   Authorization Type [employer]    Authorization Time Period Authorization Requested    PT Start Time 1059    PT Stop Time 1147    PT Time Calculation (min) 48 min    Equipment Utilized During Treatment --    Activity Tolerance Patient tolerated treatment well    Behavior During Therapy [facility] for tasks assessed/performed           Past Medical History:  Diagnosis Date  . C. difficile colitis   . Cancer [facility])    Endometrial cancer  . Essential and other specified forms of tremor   . Osteoporosis 11/2019   T score -2.8 stable from prior DEXA  . Personal history of venous thrombosis and embolism 1997  . Shingles   . Stroke ([place])   . Thrush   . TIA (transient ischemic attack)    Past Surgical History:  Procedure Laterality Date  . APPENDECTOMY  1961  . cataract surg Bilateral 2021   Dr. Gershon Crane, R eye 8/18, L 8/25  . [place]  . TONSILLECTOMY AND ADENOIDECTOMY    . TOTAL ABDOMINAL HYSTERECTOMY W/ BILATERAL SALPINGOOPHORECTOMY  1997   Patient Active Problem List   Diagnosis Date Noted  . Bilateral renal cysts 07/06/2020  . Paroxysmal supraventricular tachycardia ([place]) 10/20/2019  . History of skin cancer (basal cell) 06/16/2019  . Complete right bundle branch block 06/16/2019  . B12 deficiency 06/15/2019  . History of TIA (transient ischemic attack) 10/23/2017  . Chronic kidney disease, stage III (moderate) ([place]) 07/21/2015  . Medication management 07/04/2014  . Essential hypertension 11/25/2013  . Hyperlipidemia, mixed 11/25/2013  . Abnormal glucose 11/25/2013  . Vitamin D  deficiency 11/25/2013  . Essential tremor 11/25/2013  . History of endometrial cancer   . Hx/o DVT/PE   . Urethral prolapse   . Osteoporosis 10/10/2011    ONSET DATE: 04/17/21 (Referral Date)   REFERRING DIAG: Dizziness (R42)  THERAPY DIAG:  Dizziness and giddiness  Unsteadiness on feet  SUBJECTIVE:   SUBJECTIVE STATEMENT:  Patient reports that approx 1 month prior had a Vertigo attack. "Reports something is not right in her head". Reports when she had the attack felt as if she was bouncing off the walls. Denies spinning sensation. Denies nausea/vomitting. Reports balance feels off, denies falls.  Reports feelings of lightheadedness during attacks, and occurs spontaneous. Reports feels a black out coming on. Is lasting seconds.                                          PERTINENT HISTORY: TIA w/ Expressive Aphasia (2018), HTN, SVT, CKD Stage III, Hx of PE, DVT, Essential Tremors, and Presyncope  PAIN:  Are you having pain? No  PRECAUTIONS: Fall  WEIGHT BEARING RESTRICTIONS No  FALLS: Has patient fallen in last 6 months? No  LIVING ENVIRONMENT: Lives with: lives with their family and lives alone Lives in: House/apartment Stairs: Yes; reports no difficulty getting in/around home Has following equipment  at home: None  PLOF: Independent  PATIENT GOALS Resolve the Dizziness  OBJECTIVE:   COGNITION: Overall cognitive status: Within functional limits for tasks assessed   SENSATION: Light touch: Appears intact  POSTURE: rounded shoulders and forward head   STRENGTH: WFL for Activities Completed  TRANSFERS: Assistive device utilized: None  Sit to stand: Complete Independence Stand to sit: Complete Independence   GAIT: Gait pattern: WFL Distance walked: 50 Assistive device utilized: None Level of assistance: Modified independence Comments: Mild Unsteadiness during ambulation, no LOB.  FUNCTIONAL TESTs:  M-CTSIB: Situation 1-3 for full 30 seconds, Situation 4:  14 seconds. Increased postural sway with vision removed.  PATIENT SURVEYS:  DFS: 53, DPS: 58   VESTIBULAR ASSESSMENT    SYMPTOM BEHAVIOR:   Subjective history: See Subjective   Non-Vestibular symptoms: tinnitus   Type of dizziness: Imbalance (Disequilibrium), Unsteady with head/body turns and Lightheadedness/Faint   Frequency: 1-2x/month   Duration: seconds - minutes   Aggravating factors: Spontaneous and Induced by position change: sit to stand   Relieving factors: rest   Progression of symptoms: unchanged   OCULOMOTOR EXAM:   Ocular Alignment: normal   Ocular ROM: No Limitations   Spontaneous Nystagmus: absent   Gaze-Induced Nystagmus: left beating with right gaze   Smooth Pursuits: saccades   Saccades: intact      VESTIBULAR - OCULAR REFLEX:    Slow VOR: Normal   VOR Cancellation: Corrective Saccades   Head-Impulse Test: HIT Right: positive HIT Left: positive   Dynamic Visual Acuity: Static: 10      Dynamic: 9 (Mild Dizziness, Felt imbalanced after)     POSITIONAL TESTING: Right Dix-Hallpike: none; Duration: Left Dix-Hallpike: none; Duration: Right Roll Test: none; Duration: Left Roll Test: none; Duration:    MOTION SENSITIVITY:    Motion Sensitivity Quotient  Intensity: 0 = none, 1 = Lightheaded, 2 = Mild, 3 = Moderate, 4 = Severe, 5 = Vomiting  Intensity  1. Sitting to supine 0  2. Supine to L side 0  3. Supine to R side 0  4. Supine to sitting 0  5. L Hallpike-Dix 0  6. Up from L  0  7. R Hallpike-Dix 0  8. Up from R  0  9. Sitting, head  tipped to L knee   10. Head up from L  knee   11. Sitting, head  tipped to R knee   12. Head up from R  knee   13. Sitting head turns x5   14.Sitting head nods x5   15. In stance, 180  turn to L    16. In stance, 180  turn to R     ORTHOSTATICS: BP Seated: 135/86 BP Standing: 110/76   PATIENT EDUCATION: Education details: Educated on [employer]; Orthostatic Hypotension, Purpose of PT  Services, Signs/Symptoms of CVA/TIA and to seek medical attention Person educated: Patient Education method: Explanation Education comprehension: verbalized understanding  ASSESSMENT:  CLINICAL IMPRESSION: Patient is a 87 y.o. female who was seen today for physical therapy evaluation and treatment for Dizziness. Objective impairments include decreased activity tolerance, decreased balance and dizziness. Upon evaluation: patient presents with abnormal oculomotor exam, bilateral positive HIT indicating VOR Impairment, decreased balance, and dizziness. Negative positional testing. With M-CTSIB, patient demo decreased vestibular input for balance. Most likely indicative of Bilateral Hypofunction. These impairments are limiting patient from none; still able to complete but with increased caution. Personal factors including Age, Time since onset of injury/illness/exacerbation and 3+ comorbidities: TIA w/ Expressive Aphasia (2018), HTN,  SVT, CKD Stage III, Hx of PE, DVT, Essential Tremors, and Presyncope are also affecting patient's functional outcome. Patient will benefit from skilled PT to address above impairments and improve overall function.  REHAB POTENTIAL: Good  CLINICAL DECISION MAKING: Stable/uncomplicated  EVALUATION COMPLEXITY: Low   GOALS: Goals reviewed with patient? Yes  SHORT TERM GOALS:  STG Name Target Date Goal status  1 Patient will undergo further balance assesment with FGA Baseline:  05/25/21 INITIAL   LONG TERM GOALS:  LTG Name Target Date Goal status  1 Patient will improve DFS >/= 58 , and DPS >/= 60 Baseline: DFS: 53, DPS: 58 06/15/2021 INITIAL  2 Pt will demonstrate 4 point improvement in FGA to indicate decreased falls risk Baseline: TBA 06/15/2021 INITIAL  3 Patient will improve M-CTSIB to >/= 25 seconds to demonstrate improved vestibular input for balance Baseline: 14 seconds 06/15/2021 INITIAL  4 Patient will verbalize 50% improvement in vertigo symptoms to  allow for improved tolerance for functional activities Baseline: 06/15/2021 INITIAL   PLAN: PT FREQUENCY: 1x/week  PT DURATION: 6 weeks  PLANNED INTERVENTIONS: Therapeutic exercises, Therapeutic activity, Neuro Muscular re-education, Balance training, Gait training, Patient/Family education, Joint mobilization, Stair training, Vestibular training, Canalith repositioning and DME instructions  PLAN FOR NEXT SESSION: Assess FGA. Initiate HEP focused on corner balance and VOR x 1.   Jones Bales, PT, DPT 05/03/2021, 11:53 AM  [facility] [address] [place], [place], [address] Phone: [phone]   Fax:  [phone]

## 2021-05-10 ENCOUNTER — Telehealth: Payer: Self-pay | Admitting: Adult Health

## 2021-05-10 NOTE — Telephone Encounter (Signed)
I called pt and LMVM for her that returned call.

## 2021-05-10 NOTE — Telephone Encounter (Signed)
Patient returned call.  She is concerned  she got the [place] second booster last Friday and then on Sunday and Wednesday she is experiencing episodes of falling asleep and then waking up and not being aware that she had been asleep. 30-45 min duration. Some after eating meal, or later closer to bedtime.   She looked on google noted narcolepsy in relation to being given the COVID vaccine and says she is a little anxious about that and driving.  She is in the midst and will be starting vestibular rehab next week for  off balance/ dizziness issue per cardiology.  She has been seen in May her family doctor and cardiology and has had good checkups.  I told her that new problems go to the provider but I would relay to [facility] and see her recommendations and then go from there it may be that she just needs to monitor over time to see if she improves.

## 2021-05-10 NOTE — Telephone Encounter (Signed)
I called patient back I relayed that Jinny Blossom just said to monitor her symptoms and hopefully things will improve.  Not to drive if she does not feel comfortable.  She will keep a record and if things stay the same or gets worse she can reach back out to [place].  She appreciated call back.

## 2021-05-10 NOTE — Telephone Encounter (Signed)
I would monitor the symptoms for now and see if they improved.  Certainly should not drive if she does not feel comfortable

## 2021-05-10 NOTE — Telephone Encounter (Signed)
Pt is asking to be called to discuss this new issue where she unexpectedly falls asleep(during the day) and when she wakes up she has no idea that she had fallen asleep.  This worries pt in the event that she is driving and this takes place.  Please call.

## 2021-05-15 NOTE — Progress Notes (Signed)
Future Appointments  Date Time Provider [place]  05/16/2021 10:00 AM Unk Pinto, MD [facility] None  08/14/2021  - CPE  2:00 PM [name], NP [facility] None  10/02/2021 11:00 AM [name], NP [facility] None  01/10/2022 - Wellness  2:30 PM [name], NP [facility] None   History of Present Illness:      Patient is a very nice 87 y.o.  [place]  with HTN, CKD3b (GFR 34), ASCVD/CVAs/TIAs, HLD,  Familial / Essential Hereditary Tremor, Pre-Diabetes and Vitamin D Deficiency who presents with concerns of possible narcolepsy. Patient relates several episodes of dizziness or vertigo occuring in May. She had a Covid booster on 27 May and reports 2 days later she experienced several "episodes of loss of awareness"  lasting up to 30 or 45 minutes and had no recall of antecedent sx's. She relates at least 4 more episodes in the interim til today. Fortunately she was always sitting during the occurences. She denies any focal neuro sx's.  She does have remote hx/o a TIA manifest with aphasia which recovered. Se denies any sx's to suggesy catalepsy.     Medications  .  atorvastatin (LIPITOR) 10 MG tablet, TAKE 1 TABLET DAILY FOR CHOLESTEROL .  olmesartan (BENICAR) 5 MG tablet, Take 1 tablet  daily as needed. For blood pressure only if 150/90+ .  propranolol (INDERAL) 40 MG tablet, TAKE 1 TABLET 4 x /day  .  aspirin EC 81 MG tablet, Take 1 tablet (daily. Swallow whole. .  vitamin B-12  500 MCG tablet Sublingual, Take 500 mcg  daily.   .  Calcium-Magnesium-Vitamin D 300-20-200 MG-MG- unit, Chew 1 tablet daily  .  VITAMIN D 5000 units , Take 2 tablets b daily.  .  clonazePAM  0.5 MG tablet, TAKE 1 TABLET EVERY MORNING AND 1/2 TABLET AT NOON AND 1/2 AT NIGHT .  Magnesium 250 MG TABS, Take  2  times a day. .  topiramate  25 MG tablet, TAKE 1 TABLET  AT BEDTIME  Problem list She has History of endometrial cancer; Hx/o DVT/PE; Osteoporosis; Urethral prolapse; Essential hypertension;  Hyperlipidemia, mixed; Abnormal glucose; Vitamin D deficiency; Essential tremor; Medication management; Chronic kidney disease, stage III (moderate) ([place]); History of TIA (transient ischemic attack); B12 deficiency; History of skin cancer (basal cell); Complete right bundle branch block; Paroxysmal supraventricular tachycardia ([place]); and Bilateral renal cysts on their problem list.   Observations/Objective:  BP 118/70 (BP Location: Right Arm, Patient Position: Sitting, Cuff Size: Normal)   Pulse 74   Temp (!) 97.5 F (36.4 C)   Resp 16   Ht 5\' 4"  (1.626 m)   Wt 149 lb 6.4 oz (67.8 kg)   SpO2 98%   BMI 25.64 kg/m   Postural   Sitting BP 124/66    P 58       &      Standing BP 122/70    P 64  HEENT - WNL. Neck - supple.  Chest - Clear equal BS. Cor - Nl HS. RRR w/o sig MGR. PP 1(+). No edema. MS- FROM w/o deformities.  Gait Nl. Neuro -  Nl w/o focal abnormalities.  Assessment and Plan:  1. LOC (loss of consciousness) ([place])  - Advised patient she likely will need EEG & Sleep studies. Advised driving precautions.   - Ambulatory referral to Neurology - Ambulatory referral to Sleep Studies - EEG adult; Future  Follow Up Instructions:        I discussed the assessment and  treatment plan with the patient. The patient was provided an opportunity to ask questions and all were answered. The patient agreed with the plan and demonstrated an understanding of the instructions.        The patient was advised to call back or seek an in-person evaluation if the symptoms worsen or if the condition fails to improve as anticipated.   Kirtland Bouchard, MD

## 2021-05-16 ENCOUNTER — Ambulatory Visit: Payer: Medicare PPO | Admitting: Internal Medicine

## 2021-05-16 ENCOUNTER — Other Ambulatory Visit: Payer: Self-pay

## 2021-05-16 ENCOUNTER — Encounter: Payer: Self-pay | Admitting: Internal Medicine

## 2021-05-16 VITALS — BP 118/70 | HR 74 | Temp 97.5°F | Resp 16 | Ht 64.0 in | Wt 149.4 lb

## 2021-05-16 DIAGNOSIS — R4020 Unspecified coma: Secondary | ICD-10-CM

## 2021-05-16 NOTE — Therapy (Signed)
OUTPATIENT PHYSICAL THERAPY VESTIBULAR TREATMENT NOTE   Patient Name: Monica Curry MRN: [id number] DOB:[birth year], 87 y.o., female Today's Date: 05/17/2021  PCP: Unk Pinto, MD REFERRING PROVIDER: Fay Records, MD   PT End of Session - 05/17/21 1109     Visit Number 2    Number of Visits 7    Date for PT Re-Evaluation 07/02/21   POC for 6 weeks, Cert for 60 days   Authorization Type [employer]    Authorization Time Period Authorization Requested    PT Start Time 1107   patient arriving late   PT Stop Time 1017    PT Time Calculation (min) 38 min    Equipment Utilized During Treatment Gait belt    Activity Tolerance Patient tolerated treatment well    Behavior During Therapy WFL for tasks assessed/performed             Past Medical History:  Diagnosis Date   C. difficile colitis    Cancer ([place])    Endometrial cancer   Essential and other specified forms of tremor    Osteoporosis 11/2019   T score -2.8 stable from prior DEXA   Personal history of venous thrombosis and embolism 1997   Shingles    Stroke ([place])    Thrush    TIA (transient ischemic attack)    Past Surgical History:  Procedure Laterality Date   APPENDECTOMY  1961   cataract surg Bilateral 2021   Dr. Gershon Crane, R eye 8/18, L 8/25   [place]     TOTAL ABDOMINAL HYSTERECTOMY W/ BILATERAL SALPINGOOPHORECTOMY  1997   Patient Active Problem List   Diagnosis Date Noted   Bilateral renal cysts 07/06/2020   Paroxysmal supraventricular tachycardia ([place]) 10/20/2019   History of skin cancer (basal cell) 06/16/2019   Complete right bundle branch block 06/16/2019   B12 deficiency 06/15/2019   History of TIA (transient ischemic attack) 10/23/2017   Chronic kidney disease, stage III (moderate) ([place]) 07/21/2015   Medication management 07/04/2014   Essential hypertension 11/25/2013   Hyperlipidemia, mixed 11/25/2013   Abnormal glucose 11/25/2013    Vitamin D deficiency 11/25/2013   Essential tremor 11/25/2013   History of endometrial cancer    Hx/o DVT/PE    Urethral prolapse    Osteoporosis 10/10/2011   REFERRING DIAG: Dizziness (R42)  THERAPY DIAG:  Dizziness and giddiness  Unsteadiness on feet  PERTINENT HISTORY: TIA w/ Expressive Aphasia (2018), HTN, SVT, CKD Stage III, Hx of PE, DVT, Essential Tremors, and Presyncope, Osteoperosis  PRECAUTIONS: Fall  SUBJECTIVE: Patient reports she has a new concern, MD and her spoke about potential for narcolepsy. She started to have episodes after receiving the second booster. Patient reports feels like having none to approx 1 episode a day.  Denies dizziness, "just feels off" when she stands up. No falls.   PAIN:  Are you having pain? No  OBJECTIVE:   [facility] PT Assessment - 05/17/21 0001       Functional Gait  Assessment   Gait assessed  Yes    Gait Level Surface Walks 20 ft in less than 7 sec but greater than 5.5 sec, uses assistive device, slower speed, mild gait deviations, or deviates 6-10 in outside of the 12 in walkway width.    Change in Gait Speed Makes only minor adjustments to walking speed, or accomplishes a change in speed with significant gait deviations, deviates 10-15 in outside the 12 in walkway width, or changes speed  but loses balance but is able to recover and continue walking.    Gait with Horizontal Head Turns Performs head turns smoothly with slight change in gait velocity (eg, minor disruption to smooth gait path), deviates 6-10 in outside 12 in walkway width, or uses an assistive device.    Gait with Vertical Head Turns Performs head turns with no change in gait. Deviates no more than 6 in outside 12 in walkway width.    Gait and Pivot Turn Pivot turns safely in greater than 3 sec and stops with no loss of balance, or pivot turns safely within 3 sec and stops with mild imbalance, requires small steps to catch balance.    Step Over Obstacle Is able to step over one  shoe box (4.5 in total height) but must slow down and adjust steps to clear box safely. May require verbal cueing.    Gait with Narrow Base of Support Ambulates 4-7 steps.    Gait with Eyes Closed Walks 20 ft, uses assistive device, slower speed, mild gait deviations, deviates 6-10 in outside 12 in walkway width. Ambulates 20 ft in less than 9 sec but greater than 7 sec.    Ambulating Backwards Walks 20 ft, uses assistive device, slower speed, mild gait deviations, deviates 6-10 in outside 12 in walkway width.    Steps Alternating feet, must use rail.    Total Score 18    FGA comment: 18/30 = High Fall Risk              VESTIBULAR TREATMENT: Corner Balance: Completed standing feet shoulder width apart on pillow, completed horizontal/vertical head turns x 10 reps with eyes open, followed by x 10 horizontal/vertical head turns x 10 reps with eyes closed. Standing with narrow BOS on pillow, completed eyes closed 3 x 30 seconds.    Gaze Stabilization - Seated: Horizontal VOR x 1, completed 3 x 30 seconds, no dizziness reported. Vertical VOR x 1, completed 3 x 30 seconds, no dizziness reported. Cues required for technique with completion. Educated on proper completion for home, HEP provided.   Gaze Stabilization: Sitting    Keeping eyes on target on wall 3-4 feet away, tilt head down 15-30 and move head side to side for 30 seconds. Repeat while moving head up and down for 30 seconds. Do 2-3 sessions per day. Copyright  VHI. All rights reserved.    Gaze Stabilization: Tip Card  1.Target must remain in focus, not blurry, and appear stationary while head is in motion. 2.Perform exercises with small head movements (45 to either side of midline). 3.Increase speed of head motion so long as target is in focus. 4.If you wear eyeglasses, be sure you can see target through lens (therapist will give specific instructions for bifocal / progressive lenses). 5.These exercises may provoke dizziness  or nausea. Work through these symptoms. If too dizzy, slow head movement slightly. Rest between each exercise. 6.Exercises demand concentration; avoid distractions. 7.For safety, perform standing exercises close to a counter, wall, corner, or next to someone.  Copyright  VHI. All rights reserved.    Feet Apart (Compliant Surface) Head Motion - Eyes Closed    Stand on compliant surface: pillow with feet shoulder width apart. Close eyes and move head slowly, up and down, followed by right and left Repeat 10 times per session. Do 2 sessions per day.  Copyright  VHI. All rights reserved.    Feet Together, Eyes Closed    Stand with feet together and arms by your  side. Close eyes and visualize upright position. Hold 30 seconds. Repeat 3 times per session. Do 2 sessions per day.  PATIENT EDUCATION: Education details: Educated on Initial HEP Person educated: Patient Education method: Explanation; Handout Education comprehension: verbalized understanding   ASSESSMENT:   CLINICAL IMPRESSION: Today's skilled PT session focused on assessment of FGA with patient scoring 18/30 today, increased challenge with tandem, head turns/nods, and gait with vision removed. Rest of session spent establishing initial HEP focused on VOR x 1 and corner balance. Will continue to progress toward all LTGs.    REHAB POTENTIAL: Good   CLINICAL DECISION MAKING: Stable/uncomplicated   EVALUATION COMPLEXITY: Low     GOALS: Goals reviewed with patient? Yes   SHORT TERM GOALS:   STG Name Target Date Goal status  1 Patient will undergo further balance assesment with FGA Baseline:  05/25/21 INITIAL    LONG TERM GOALS:   LTG Name Target Date Goal status  1 Patient will improve DFS >/= 58 , and DPS >/= 60 Baseline: DFS: 53, DPS: 58 06/15/2021 INITIAL  2 Pt will demonstrate 4 point improvement in FGA to indicate decreased falls risk Baseline: TBA 06/15/2021 INITIAL  3 Patient will improve M-CTSIB to >/=  25 seconds to demonstrate improved vestibular input for balance Baseline: 14 seconds 06/15/2021 INITIAL  4 Patient will verbalize 50% improvement in vertigo symptoms to allow for improved tolerance for functional activities Baseline: 06/15/2021 INITIAL    PLAN: PT FREQUENCY: 1x/week   PT DURATION: 6 weeks   PLANNED INTERVENTIONS: Therapeutic exercises, Therapeutic activity, Neuro Muscular re-education, Balance training, Gait training, Patient/Family education, Joint mobilization, Stair training, Vestibular training, Canalith repositioning and DME instructions   PLAN FOR NEXT SESSION: Review HEP, Progress VOR x 1. High Level Balance focused on improved vestibular input.      Jones Bales, PT, DPT 05/17/2021, 12:14 PM    [facility] [address] [place], [place], [address] Phone: [phone]   Fax:  [phone]  Patient name: Lety Cullens MRN: [id number] DOB: 11/04/1931

## 2021-05-16 NOTE — Patient Instructions (Addendum)
Narcolepsy  Narcolepsy is a neurological disorder that causes people to fall asleep suddenly and without control (have sleep attacks) during the daytime. It is a lifelong disorder. Narcolepsy disrupts the sleep cycle at night, which then causes daytime sleepiness. What are the causes? The cause of narcolepsy is not fully understood, but it may be related to:  Low levels of hypocretin, a chemical (neurotransmitter) in the brain that controls sleep and wake cycles. Hypocretin imbalance may be caused by: ? Abnormal genes that are passed from parent to child (inherited). ? An autoimmune disease in which the body's defense system (immune system) attacks the brain cells that make hypocretin.  Infection, tumor, or injury in the area of the brain that controls sleep.  Exposure to poisons (toxins), such as heavy metals, pesticides, and secondhand smoke. What are the signs or symptoms? Symptoms of this condition include:  Excessive daytime sleepiness. This is the most common symptom and is usually the first symptom you will notice. This may affect your performance at work or school.  Sleep attacks. You may fall asleep in the middle of an activity, especially low-energy activities like reading or watching TV.  Feeling like you cannot think clearly and trouble focusing or remembering things. You may also feel depressed.  Sudden muscle weakness (cataplexy). When this occurs, your speech may become slurred, or your knees may buckle. Cataplexy is usually triggered by surprise, anger, fear, or laughter.  Losing the ability to speak or move (sleep paralysis). This may occur just as you start to fall asleep or wake up. You will be aware of the paralysis. It usually lasts for just a few seconds or minutes.  Seeing, hearing, tasting, smelling, or feeling things that are not real (hallucinations). Hallucinations may occur with sleep paralysis. They can happen when you are falling asleep, waking up, or  dozing.  Trouble staying asleep at night (insomnia) and restless sleep. How is this diagnosed? This condition may be diagnosed based on:  A physical exam to rule out any other problems that may be causing your symptoms. You may be asked to write down your sleeping patterns for several weeks in a sleep diary. This will help your health care provider make a diagnosis.  Sleep studies that measure how well your REM sleep is regulated. These tests also measure your heart rate, breathing, movement, and brain waves. These tests include: ? An overnight sleep study (polysomnogram). ? A daytime sleep study that is done while you take several naps during the day (multiple sleep latency test, MSLT). This test measures how quickly you fall asleep and how quickly you enter REM sleep.  Removal of spinal fluid to measure hypocretin levels. How is this treated? There is no cure for this condition, but treatment can help relieve symptoms. Treatment may include:  Lifestyle and sleeping strategies to help you cope with the condition, such as: ? Exercising regularly. ? Maintaining a regular sleep schedule. ? Avoiding caffeine and large meals before bed.  Medicines. These may include: ? Medicines that help keep you awake and alert (stimulants) to fight daytime sleepiness. ? Medicines that treat depression (antidepressants). These may be used to treat cataplexy. ? Sodium oxybate. This is a strong medicine to help you relax (sedative) that you may take at night. It can help control daytime sleepiness and cataplexy.  Other treatments may include mental health counseling or joining a support group. Follow these instructions at home: Sleeping habits  Get about 8 hours of sleep every night.  Go to  sleep and get up at about the same time every day.  Keep your bedroom dark, quiet, and comfortable.  When you feel very tired, take short naps. Schedule naps so that you take them at about the same time every  day.  Before bedtime: ? Avoid bright lights and screens. ? Relax. Try activities like reading or taking a warm bath.  Activity  Get at least 20 minutes of exercise every day. This will help you sleep better at night and reduce daytime sleepiness.  Avoid exercising within 3 hours of bedtime.  Do not drive or use heavy machinery if you are sleepy. If possible, take a nap before driving.  Do not swim or go out on the water without a life jacket. Eating and drinking  Do not drink alcohol or caffeinated beverages within 4-5 hours of bedtime.  Do not eat a large meal before bedtime.  Eat meals at about the same times every day. General instructions  Take over-the-counter and prescription medicines only as told by your health care provider.  Keep a sleep diary as told by your health care provider.  Tell your employer or teachers that you have narcolepsy. You may be able to adjust your schedule to include time for naps.  Do not use any products that contain nicotine or tobacco, such as cigarettes, e-cigarettes, and chewing tobacco. If you need help quitting, ask your health care provider.  Keep all follow-up visits as told by your health care provider. This is important.  Where to find more information  [employer] of Neurological Disorders: MasterBoxes.it Contact a health care provider if:  Your symptoms are not getting better.  You have increasingly high blood pressure (hypertension).  You have changes in your heart rhythm.  You are having a hard time determining what is real and what is not (psychosis). Get help right away if you:  Hurt yourself during a sleep attack or an attack of cataplexy.  Have chest pain.  Have trouble breathing. These symptoms may represent a serious problem that is an emergency. Do not wait to see if the symptoms will go away. Get medical help right away. Call your local emergency services (911 in the [place] Do not drive yourself to the  hospital. Summary  Narcolepsy is a neurological disorder that causes people to fall asleep suddenly, and without control, during the daytime (sleep attacks). It is a lifelong disorder.  There is no cure for this condition, but treatment can help relieve symptoms.  Go to sleep and get up at about the same time every day. Follow instructions about sleep and activities as told by your health care provider.  Take over-the-counter and prescription medicines only as told by your health care provider. This information is not intended to replace advice given to you by your health care provider. Make sure you discuss any questions you have with your health care provider. Document Revised: 07/07/2019 Document Reviewed: 07/07/2019 Elsevier Patient Education  2021 [employer]. ===============================  Syncope Syncope is when you pass out (faint) for a short time. It is caused by a sudden decrease in blood flow to the brain. Signs that you may be about to pass out include:  Feeling dizzy or light-headed.  Feeling sick to your stomach (nauseous).  Seeing all white or all black.  Having cold, clammy skin. If you pass out, get help right away. Call your local emergency services (911 in the [place] Do not drive yourself to the hospital. Follow these instructions at home: Watch  for any changes in your symptoms. Take these actions to stay safe and help with your symptoms: Lifestyle  Do not drive, use machinery, or play sports until your doctor says it is okay.  Do not drink alcohol.  Do not use any products that contain nicotine or tobacco, such as cigarettes and e-cigarettes. If you need help quitting, ask your doctor.  Drink enough fluid to keep your pee (urine) pale yellow. General instructions  Take over-the-counter and prescription medicines only as told by your doctor.  If you are taking blood pressure or heart medicine, sit up and stand up slowly. Spend a few minutes getting  ready to sit and then stand. This can help you feel less dizzy.  Have someone stay with you until you feel stable.  If you start to feel like you might pass out, lie down right away and raise (elevate) your feet above the level of your heart. Breathe deeply and steadily. Wait until all of the symptoms are gone.  Keep all follow-up visits as told by your doctor. This is important. Get help right away if:  You have a very bad headache.  You pass out once or more than once.  You have pain in your chest, belly, or back.  You have a very fast or uneven heartbeat (palpitations).  It hurts to breathe.  You are bleeding from your mouth or your bottom (rectum).  You have black or tarry poop (stool).  You have jerky movements that you cannot control (seizure).  You are confused.  You have trouble walking.  You are very weak.  You have vision problems. These symptoms may be an emergency. Do not wait to see if the symptoms will go away. Get medical help right away. Call your local emergency services (911 in the [place] Do not drive yourself to the hospital. Summary  Syncope is when you pass out (faint) for a short time. It is caused by a sudden decrease in blood flow to the brain.  Signs that you may be about to faint include feeling dizzy, light-headed, or sick to your stomach, seeing all white or all black, or having cold, clammy skin.  If you start to feel like you might pass out, lie down right away and raise (elevate) your feet above the level of your heart. Breathe deeply and steadily. Wait until all of the symptoms are gone. This information is not intended to replace advice given to you by your health care provider. Make sure you discuss any questions you have with your health care provider. Document Revised: 01/05/2020 Document Reviewed: 01/07/2018 Elsevier Patient Education  2021 [employer].

## 2021-05-17 ENCOUNTER — Ambulatory Visit: Payer: Medicare PPO | Attending: Internal Medicine

## 2021-05-17 DIAGNOSIS — R2681 Unsteadiness on feet: Secondary | ICD-10-CM

## 2021-05-17 DIAGNOSIS — R42 Dizziness and giddiness: Secondary | ICD-10-CM | POA: Diagnosis not present

## 2021-05-22 ENCOUNTER — Ambulatory Visit: Payer: Medicare PPO | Admitting: Internal Medicine

## 2021-05-23 NOTE — Therapy (Signed)
OUTPATIENT PHYSICAL THERAPY VESTIBULAR TREATMENT NOTE   Patient Name: Robert Sunga MRN: [id number] DOB:June 23, 1931, 87 y.o., female Today's Date: 05/24/2021  PCP: Unk Pinto, MD REFERRING PROVIDER: Fay Records, MD   PT End of Session - 05/24/21 1105     Visit Number 3    Number of Visits 7    Date for PT Re-Evaluation 07/02/21   POC for 6 weeks, Cert for 60 days   Authorization Type [employer]    Authorization Time Period Authorization Requested    PT Start Time 1103    PT Stop Time 2542    PT Time Calculation (min) 42 min    Equipment Utilized During Treatment Gait belt    Activity Tolerance Patient tolerated treatment well    Behavior During Therapy WFL for tasks assessed/performed              Past Medical History:  Diagnosis Date   C. difficile colitis    Cancer ([place])    Endometrial cancer   Essential and other specified forms of tremor    Osteoporosis 11/2019   T score -2.8 stable from prior DEXA   Personal history of venous thrombosis and embolism 1997   Shingles    Stroke ([place])    Thrush    TIA (transient ischemic attack)    Past Surgical History:  Procedure Laterality Date   APPENDECTOMY  1961   cataract surg Bilateral 2021   Dr. Gershon Crane, R eye 8/18, L 8/25   [facility]     TOTAL ABDOMINAL HYSTERECTOMY W/ BILATERAL SALPINGOOPHORECTOMY  1997   Patient Active Problem List   Diagnosis Date Noted   Bilateral renal cysts 07/06/2020   Paroxysmal supraventricular tachycardia ([place]) 10/20/2019   History of skin cancer (basal cell) 06/16/2019   Complete right bundle branch block 06/16/2019   B12 deficiency 06/15/2019   History of TIA (transient ischemic attack) 10/23/2017   Chronic kidney disease, stage III (moderate) ([place]) 07/21/2015   Medication management 07/04/2014   Essential hypertension 11/25/2013   Hyperlipidemia, mixed 11/25/2013   Abnormal glucose 11/25/2013   Vitamin D deficiency  11/25/2013   Essential tremor 11/25/2013   History of endometrial cancer    Hx/o DVT/PE    Urethral prolapse    Osteoporosis 10/10/2011   REFERRING DIAG: Dizziness (R42)  THERAPY DIAG:  Dizziness and giddiness  Unsteadiness on feet  PERTINENT HISTORY: TIA w/ Expressive Aphasia (2018), HTN, SVT, CKD Stage III, Hx of PE, DVT, Essential Tremors, and Presyncope, Osteoperosis  PRECAUTIONS: Fall  SUBJECTIVE: Patient reports that she feels like she has been blinking a lot, but reports feeling okay other than that. Patient reports she is doing the exercises but had difficulty finding a place to put the "x" for the VOR exercises. Frustrated that she cannot be seen by [facility] until August.   PAIN:  Are you having pain? No  OBJECTIVE:  VESTIBULAR TREATMENT:   Corner Balance: Completed standing feet shoulder width apart on pillow, completed horizontal/vertical head turns x 10 reps with eyes open, followed by x 10 horizontal/vertical head turns x 10 reps with eyes closed. Standing with narrow BOS on pillow, completed eyes closed 3 x 30 seconds.  Completed tandem stance with EO, 2 x 30 seconds alternating feet. Then progressed to addition of horizontal/vertical head turns with EO x 10 reps each. CGA required with addition of head turns. Gaze Stabilization - Seated: Horizontal VOR x 1, completed 1 x 30 seconds, no dizziness reported.  Vertical VOR x 1, completed 1 x 30 seconds, no dizziness reported. Cues required for range of head turns. Progressed to Standing: Horizontal VOR x 1, completed 1 x 30 seconds, no dizziness reported. Vertical VOR x 1, completed 1 x 30 seconds, no dizziness reported. Progressed to Standing at Home.   Gaze Stabilization: Sitting (PT progressed to standing at home, patient did not want new handout)    Keeping eyes on target on wall 3-4 feet away, tilt head down 15-30 and move head side to side for 30 seconds. Repeat while moving head up and down for 30 seconds. Do 2-3  sessions per day. Copyright  VHI. All rights reserved.    Gaze Stabilization: Tip Card  1.Target must remain in focus, not blurry, and appear stationary while head is in motion. 2.Perform exercises with small head movements (45 to either side of midline). 3.Increase speed of head motion so long as target is in focus. 4.If you wear eyeglasses, be sure you can see target through lens (therapist will give specific instructions for bifocal / progressive lenses). 5.These exercises may provoke dizziness or nausea. Work through these symptoms. If too dizzy, slow head movement slightly. Rest between each exercise. 6.Exercises demand concentration; avoid distractions. 7.For safety, perform standing exercises close to a counter, wall, corner, or next to someone.  Copyright  VHI. All rights reserved.    Feet Apart (Compliant Surface) Head Motion - Eyes Closed    Stand on compliant surface: pillow with feet shoulder width apart. Close eyes and move head slowly, up and down, followed by right and left Repeat 10 times per session. Do 2 sessions per day.  Copyright  VHI. All rights reserved.    Feet Together, Eyes Closed    Stand with feet together and arms by your side. Close eyes and visualize upright position. Hold 30 seconds. Repeat 3 times per session. Do 2 sessions per day.   Tandem Stance ([place] on 05/24/21)      Right foot in front of left, heel touching toe both feet "straight ahead". Stand on Foot Triangle of Support with both feet. Balance in this position 30 seconds. Do with left foot in front of right.  PATIENT EDUCATION: Education details: Reviewed HEP; Progressed VOR to Standing. Tandem Stance [place] Person educated: Patient Education method: Explanation; Handout Education comprehension: verbalized understanding   ASSESSMENT:   CLINICAL IMPRESSION: Patient's BP elevated at start of session prior to activities, but stabilized with seated rest break. Session  focused on review of corner balance HEP and VOR x 1. Progressed HEP to included tandem stance with patient tolerating well. Progressed VOR x 1 to standing with patient doing well, did require verbal cues for range of head turns and speed. Monitored BP throughout session with it staying stable. Will continue to progress toward all LTGs.   REHAB POTENTIAL: Good   CLINICAL DECISION MAKING: Stable/uncomplicated   EVALUATION COMPLEXITY: Low     GOALS: Goals reviewed with patient? Yes   SHORT TERM GOALS:   STG Name Target Date Goal status  1 Patient will undergo further balance assesment with FGA Baseline: 18/30 on 05/17/21 05/25/21 MET    LONG TERM GOALS:   LTG Name Target Date Goal status  1 Patient will improve DFS >/= 58 , and DPS >/= 60 Baseline: DFS: 53, DPS: 58 06/15/2021 INITIAL  2 Pt will demonstrate 4 point improvement in FGA to indicate decreased falls risk Baseline: 18/30 06/15/2021 INITIAL  3 Patient will improve M-CTSIB to >/= 25  seconds to demonstrate improved vestibular input for balance Baseline: 14 seconds 06/15/2021 INITIAL  4 Patient will verbalize 50% improvement in vertigo symptoms to allow for improved tolerance for functional activities Baseline: 06/15/2021 INITIAL    PLAN: PT FREQUENCY: 1x/week   PT DURATION: 6 weeks   PLANNED INTERVENTIONS: Therapeutic exercises, Therapeutic activity, Neuro Muscular re-education, Balance training, Gait training, Patient/Family education, Joint mobilization, Stair training, Vestibular training, Canalith repositioning and DME instructions   PLAN FOR NEXT SESSION: How was HEP addition? Monitor BP. Continue balance activities focused on vision removed. Head Turns, High level Balance. Progress VOR x 1     Jones Bales, PT, DPT 05/24/2021, 12:46 PM    [facility] [address] [place], [place], [address] Phone: [phone]   Fax:  [phone]  Patient name:  Ladasia Sircy MRN: [id number] DOB: February 10, 1931

## 2021-05-24 ENCOUNTER — Other Ambulatory Visit: Payer: Self-pay

## 2021-05-24 ENCOUNTER — Ambulatory Visit: Payer: Medicare PPO

## 2021-05-24 VITALS — BP 145/77 | HR 65

## 2021-05-24 DIAGNOSIS — R42 Dizziness and giddiness: Secondary | ICD-10-CM

## 2021-05-24 DIAGNOSIS — R2681 Unsteadiness on feet: Secondary | ICD-10-CM | POA: Diagnosis not present

## 2021-05-24 NOTE — Patient Instructions (Signed)
Tandem Stance    Right foot in front of left, heel touching toe both feet "straight ahead". Stand on Foot Triangle of Support with both feet. Balance in this position 30 seconds. Do with left foot in front of right.  Copyright  VHI. All rights reserved.   

## 2021-05-28 ENCOUNTER — Other Ambulatory Visit: Payer: Self-pay | Admitting: Internal Medicine

## 2021-05-28 DIAGNOSIS — E782 Mixed hyperlipidemia: Secondary | ICD-10-CM

## 2021-05-30 NOTE — Therapy (Signed)
OUTPATIENT PHYSICAL THERAPY VESTIBULAR TREATMENT NOTE   Patient Name: Monica Curry MRN: [id number] DOB:28-Aug-1931, 87 y.o., female Today's Date: 05/31/2021  PCP: Unk Pinto, MD REFERRING PROVIDER: Fay Records, MD   PT End of Session - 05/31/21 1022     Visit Number 4    Number of Visits 7    Date for PT Re-Evaluation 07/02/21   POC for 6 weeks, Cert for 60 days   Authorization Type [employer]    Authorization Time Period Authorization Requested    PT Start Time 1020    PT Stop Time 1100    PT Time Calculation (min) 40 min    Equipment Utilized During Treatment Gait belt    Activity Tolerance Patient tolerated treatment well    Behavior During Therapy WFL for tasks assessed/performed               Past Medical History:  Diagnosis Date   C. difficile colitis    Cancer ([place])    Endometrial cancer   Essential and other specified forms of tremor    Osteoporosis 11/2019   T score -2.8 stable from prior DEXA   Personal history of venous thrombosis and embolism 1997   Shingles    Stroke ([place])    Thrush    TIA (transient ischemic attack)    Past Surgical History:  Procedure Laterality Date   APPENDECTOMY  1961   cataract surg Bilateral 2021   Dr. Gershon Crane, R eye 8/18, L 8/25   [place]     TOTAL ABDOMINAL HYSTERECTOMY W/ BILATERAL SALPINGOOPHORECTOMY  1997   Patient Active Problem List   Diagnosis Date Noted   Bilateral renal cysts 07/06/2020   Paroxysmal supraventricular tachycardia ([place]) 10/20/2019   History of skin cancer (basal cell) 06/16/2019   Complete right bundle branch block 06/16/2019   B12 deficiency 06/15/2019   History of TIA (transient ischemic attack) 10/23/2017   Chronic kidney disease, stage III (moderate) ([place]) 07/21/2015   Medication management 07/04/2014   Essential hypertension 11/25/2013   Hyperlipidemia, mixed 11/25/2013   Abnormal glucose 11/25/2013   Vitamin D deficiency  11/25/2013   Essential tremor 11/25/2013   History of endometrial cancer    Hx/o DVT/PE    Urethral prolapse    Osteoporosis 10/10/2011   REFERRING DIAG: Dizziness (R42)  THERAPY DIAG:  Dizziness and giddiness  Unsteadiness on feet  PERTINENT HISTORY: TIA w/ Expressive Aphasia (2018), HTN, SVT, CKD Stage III, Hx of PE, DVT, Essential Tremors, and Presyncope, Osteoperosis  PRECAUTIONS: Fall  SUBJECTIVE:   PAIN:  Are you having pain? No  OBJECTIVE:  Patient requesting to speak with PT regarding concerns with balance exercises.Patient unsure of reasoning behind head movement and eyes closed. PT educating on systems that are utilized for balance, and how we alter the activities with vision removed and head movement to further challenge these systems. Patient verbalized understanding after extended conversation. Patient reporting that she does not feel comfortable with vision removed at this time, would like to focus on other activities. PT educating that patient's biggest challenge is with vision removed, and educated on importance of incorporating this in a safe manner when she feels able.   VESTIBULAR TREATMENT: Completed all of the exercises as a review of HEP. PT providing cues for proper completion. PT educating that she will leave eyes closed on handout, if she feels confident/comfortable completing she should at home.   Gaze Stabilization: Sitting > Standing    Keeping  eyes on target on wall 3-4 feet away, tilt head down 15-30 and move head side to side for 30 seconds. Repeat while moving head up and down for 30 seconds. Do 2-3 sessions per day. Copyright  VHI. All rights reserved.    Gaze Stabilization: Tip Card  1.Target must remain in focus, not blurry, and appear stationary while head is in motion. 2.Perform exercises with small head movements (45 to either side of midline). 3.Increase speed of head motion so long as target is in focus. 4.If you wear eyeglasses, be  sure you can see target through lens (therapist will give specific instructions for bifocal / progressive lenses). 5.These exercises may provoke dizziness or nausea. Work through these symptoms. If too dizzy, slow head movement slightly. Rest between each exercise. 6.Exercises demand concentration; avoid distractions. 7.For safety, perform standing exercises close to a counter, wall, corner, or next to someone.     Feet Together, Head Motion - Eyes Open on Pillow    With eyes open, feet together, move head slowly: up and down x 10 reps, then right and left x 10 reps. Repeat 2 times per session. Do 1 sessions per day.   Feet Apart (Compliant Surface) Head Motion - Eyes Closed    Stand on compliant surface: pillow with feet shoulder width apart. Close eyes and move head slowly, up and down, followed by right and left Repeat 10 times per session. Do 2 sessions per day.  Copyright  VHI. All rights reserved.    Feet Together, Eyes Closed    Stand with feet together and arms by your side. Close eyes and visualize upright position. Hold 30 seconds. Repeat 3 times per session. Do 2 sessions per day.   Tandem Stance  (addition of Horizontal Head Turns)        Right foot in front of left, heel touching toe both feet "straight ahead". Stand on Foot Triangle of Support with both feet. Balance in this position 30 seconds. Do with left foot in front of right. After you complete this, progress to adding in head turns to right and left x 10 reps.     Single Leg (Compliant Surface) - Eyes Open    Stand on compliant surface: pillow holding support as needed. Lift right leg while maintaining balance over other leg. Hold 15-20 seconds. Then completed on opposite leg Repeat 2 times per session. Do 1 session per day.  Copyright  VHI. All rights reserved.    Marching in Place: Varied Surfaces    March in place, slowly lifting knees toward chest. Repeat 10 times per session. Do 1  sessions per day.   Copyright  VHI. All rights reserved.     PATIENT EDUCATION: Education details: Updated HEP; Completing at Home to determine comfort with self management per patient request. Person educated: Patient Education method: Explanation; Handout Education comprehension: verbalized understanding   ASSESSMENT:   CLINICAL IMPRESSION: Patient requesting to speak with PT at start of session regarding purpose of balance exercises, and benefit. PT spent extensive time educating patient on balance strategies and purpose of vision removed/head movement. Patient requesting to try to work on balance at home. Therefore rest of session spent reviewing HEP and progressing to patient's tolerance. PT encouraging to continue to complete eyes closed as tolerated. Will follow up with patient at next visit and determine if she would like to precede with PT services.   REHAB POTENTIAL: Good   CLINICAL DECISION MAKING: Stable/uncomplicated   EVALUATION COMPLEXITY: Low  GOALS: Goals reviewed with patient? Yes   SHORT TERM GOALS:   STG Name Target Date Goal status  1 Patient will undergo further balance assesment with FGA Baseline: 18/30 on 05/17/21 05/25/21 MET    LONG TERM GOALS:   LTG Name Target Date Goal status  1 Patient will improve DFS >/= 58 , and DPS >/= 60 Baseline: DFS: 53, DPS: 58 06/15/2021 INITIAL  2 Pt will demonstrate 4 point improvement in FGA to indicate decreased falls risk Baseline: 18/30 06/15/2021 INITIAL  3 Patient will improve M-CTSIB to >/= 25 seconds to demonstrate improved vestibular input for balance Baseline: 14 seconds 06/15/2021 INITIAL  4 Patient will verbalize 50% improvement in vertigo symptoms to allow for improved tolerance for functional activities Baseline: 06/15/2021 INITIAL    PLAN: PT FREQUENCY: 1x/week   PT DURATION: 6 weeks   PLANNED INTERVENTIONS: Therapeutic exercises, Therapeutic activity, Neuro Muscular re-education, Balance  training, Gait training, Patient/Family education, Joint mobilization, Stair training, Vestibular training, Canalith repositioning and DME instructions   PLAN FOR NEXT SESSION: How was HEP going at home? D/C?     Jones Bales, PT, DPT 05/31/2021, 11:12 AM    [facility] [address] [place], [place], [address] Phone: [phone]   Fax:  [phone]  Patient name: Gaia Gullikson MRN: [id number] DOB: 1931-06-02

## 2021-05-31 ENCOUNTER — Other Ambulatory Visit: Payer: Self-pay

## 2021-05-31 ENCOUNTER — Ambulatory Visit: Payer: Medicare PPO

## 2021-05-31 DIAGNOSIS — R42 Dizziness and giddiness: Secondary | ICD-10-CM

## 2021-05-31 DIAGNOSIS — R2681 Unsteadiness on feet: Secondary | ICD-10-CM | POA: Diagnosis not present

## 2021-05-31 NOTE — Patient Instructions (Signed)
Gaze Stabilization: Sitting > Standing    Keeping eyes on target on wall 3-4 feet away, tilt head down 15-30 and move head side to side for 30 seconds. Repeat while moving head up and down for 30 seconds. Do 2-3 sessions per day. Copyright  VHI. All rights reserved.    Gaze Stabilization: Tip Card  1.Target must remain in focus, not blurry, and appear stationary while head is in motion. 2.Perform exercises with small head movements (45 to either side of midline). 3.Increase speed of head motion so long as target is in focus. 4.If you wear eyeglasses, be sure you can see target through lens (therapist will give specific instructions for bifocal / progressive lenses). 5.These exercises may provoke dizziness or nausea. Work through these symptoms. If too dizzy, slow head movement slightly. Rest between each exercise. 6.Exercises demand concentration; avoid distractions. 7.For safety, perform standing exercises close to a counter, wall, corner, or next to someone.  Copyright  VHI. All rights reserved.   Feet Together, Head Motion - Eyes Open on Pillow    With eyes open, feet together, move head slowly: up and down x 10 reps, then right and left x 10 reps. Repeat 2 times per session. Do 1 sessions per day.  Feet Apart (Compliant Surface) Head Motion - Eyes Closed    Stand on compliant surface: pillow with feet shoulder width apart. Close eyes and move head slowly, up and down, followed by right and left Repeat 10 times per session. Do 2 sessions per day.  Copyright  VHI. All rights reserved.    Feet Together, Eyes Closed    Stand with feet together and arms by your side. Close eyes and visualize upright position. Hold 30 seconds. Repeat 3 times per session. Do 2 sessions per day.   Tandem Stance     Right foot in front of left, heel touching toe both feet "straight ahead". Stand on Foot Triangle of Support with both feet. Balance in this position 30 seconds. Do with  left foot in front of right. After you complete this, progress to adding in head turns to right and left x 10 reps.   Single Leg (Compliant Surface) - Eyes Open    Stand on compliant surface: pillow holding support as needed. Lift right leg while maintaining balance over other leg. Hold 15-20 seconds. Then completed on opposite leg Repeat 2 times per session. Do 1 session per day.  Copyright  VHI. All rights reserved.   Marching in Place: Varied Surfaces    March in place, slowly lifting knees toward chest. Repeat 10 times per session. Do 1 sessions per day.   Copyright  VHI. All rights reserved.

## 2021-06-11 ENCOUNTER — Other Ambulatory Visit: Payer: Self-pay | Admitting: Adult Health

## 2021-06-11 DIAGNOSIS — G250 Essential tremor: Secondary | ICD-10-CM

## 2021-06-13 ENCOUNTER — Telehealth: Payer: Self-pay | Admitting: Adult Health

## 2021-06-13 ENCOUNTER — Other Ambulatory Visit: Payer: Self-pay | Admitting: Adult Health

## 2021-06-13 ENCOUNTER — Ambulatory Visit: Payer: Medicare PPO | Admitting: Adult Health

## 2021-06-13 ENCOUNTER — Encounter: Payer: Self-pay | Admitting: Adult Health

## 2021-06-13 ENCOUNTER — Ambulatory Visit (HOSPITAL_COMMUNITY)
Admission: RE | Admit: 2021-06-13 | Discharge: 2021-06-13 | Disposition: A | Discharge status: Home / Self Care | Payer: Medicare PPO | Source: Ambulatory Visit | Attending: Adult Health | Admitting: Adult Health

## 2021-06-13 ENCOUNTER — Other Ambulatory Visit: Payer: Self-pay

## 2021-06-13 VITALS — BP 120/72 | HR 77 | Temp 97.7°F | Ht 64.0 in | Wt 148.0 lb

## 2021-06-13 DIAGNOSIS — R079 Chest pain, unspecified: Secondary | ICD-10-CM | POA: Diagnosis not present

## 2021-06-13 DIAGNOSIS — J9811 Atelectasis: Secondary | ICD-10-CM | POA: Diagnosis not present

## 2021-06-13 DIAGNOSIS — R509 Fever, unspecified: Secondary | ICD-10-CM | POA: Diagnosis not present

## 2021-06-13 DIAGNOSIS — R109 Unspecified abdominal pain: Secondary | ICD-10-CM | POA: Diagnosis not present

## 2021-06-13 DIAGNOSIS — G250 Essential tremor: Secondary | ICD-10-CM

## 2021-06-13 NOTE — Telephone Encounter (Signed)
Per [place] registry, last filled on 05/14/2021 Clonazepam 0.5 Mg Tablet #60/30 provided by MM NP. Patient has a pending appt with Dr Rexene Alberts on 07/17/21. Will send 2 refills to MM NP to e-scribe.

## 2021-06-13 NOTE — Telephone Encounter (Signed)
Pt is requesting a refill for clonazePAM (KLONOPIN) 0.5 MG tablet .  Pharmacy: [facility] [phone]

## 2021-06-13 NOTE — Telephone Encounter (Signed)
Per [place] registry, last filled on 05/14/21.

## 2021-06-13 NOTE — Patient Instructions (Addendum)
Please get chest xray at [facility] is irritation and swelling of the linings of your lungs. This can cause pain in your chest, back, or shoulder. It can also cause troublebreathing. What are the causes? A lung infection. A blood clot that travels to the lungs. Injury to the chest. Lung cancer or a lung tumor. Heart or chest surgery. Lung damage from inhaling asbestos. Certain medicines or treatment for cancer. Diseases that can cause irritation and swelling of the lungs. Sometimes, the cause is not known. What are the signs or symptoms? The main symptom is chest pain, usually on one side. The pain starts suddenly and can be sharp, stabbing, or dull. It may get worse when you cough or breathe deep. Other symptoms include: Trouble breathing. Noisy breathing. Cough. Chills. Fever. Coughing up blood. Symptoms can be worse when you are lying down or lying to one side. How is this treated? Treatment depends on the cause. It may include: Medicines to help with swelling. Antibiotic medicines, if your condition was caused by an infection by bacteria. Pain medicine. Cough medicine. Blood thinners, if your condition was caused by a blood clot. Taking the fluid or air out of your lungs with a tube. Follow these instructions at home: Medicines Take over-the-counter and prescription medicines only as told by your doctor. Take your antibiotic medicine as told by your doctor. Do not stop taking the antibiotic even if you start to feel better. If you were prescribed medicines to remove extra fluid from your lungs (diuretics), take them as told by your doctor. If you were prescribed blood thinners, take them exactly as prescribed. This is important. Activity Rest and return to your normal activities as told by your doctor. Ask your doctor what activities are safe for you. Ask your health care provider if the medicine prescribed to you requires you to avoid  driving or using machinery. General instructions  Watch for any changes in how you feel. Take deep breaths often, even if it hurts. This can help prevent lung problems. You may be given a small machine to help exercise your lungs and breathing, to prevent lung complications. Do not use any products that contain nicotine or tobacco, such as cigarettes, e-cigarettes, and chewing tobacco. If you need help quitting, ask your doctor. Keep all follow-up visits as told by your doctor. This is important.  Contact a doctor if: You have pain that: Gets worse. Happens more often. Does not get better with medicine. You have a fever or chills. Your cough gets worse. You have trouble breathing. You cough up mucus or thick spit (phlegm) that looks like pus. Get help right away if you: Cough up blood. Have symptoms that get worse. This includes: Trouble breathing. Feeling like it is hard to breathe. Noisy breathing. Have pain that spreads into your neck, arms, or jaw. Feel faint or dizzy. These symptoms may be an emergency. Do not wait to see if the symptoms will go away. Get medical help right away. Call your local emergency services (911 in the [place] Do not drive yourself to the hospital. Summary Pleurisy is irritation and swelling of the linings of your lungs. Pleurisy can cause pain and trouble breathing. Do not use any products that contain nicotine or tobacco, such as cigarettes, e-cigarettes, and chewing tobacco. If you need help quitting, ask your doctor. Keep all follow-up visits as told by your doctor. This is important. This information is not intended to replace advice  given to you by your health care provider. Make sure you discuss any questions you have with your healthcare provider. Document Revised: 12/30/2019 Document Reviewed: 12/30/2019 Elsevier Patient Education  2022 [employer].

## 2021-06-13 NOTE — Progress Notes (Signed)
Assessment and Plan:  Monica Curry was seen today for acute visit.  Diagnoses and all orders for this visit:  Left-sided chest pain/flank pain With inspiration, however report of fever/chills (resolved), also with CVA tenderness, lungs sound clear Will check renal labs, r/o infection Get CXR, r/o CAP, ? Possible pleurisy Low suspicion of cardiac etiology with her description - EKG today shows no significant changes from 04/17/2021 at cardiology, no ST changes Can do tylenol, ice, topicals pending workup results Go to the ER if any worsening chest pain, shortness of breath, nausea, dizziness, severe HA, changes vision/speech -     DG Chest 2 View; Future -     CBC with Differential/Platelet -     COMPLETE METABOLIC PANEL WITH GFR -     EKG 12-Lead -     Urinalysis, Routine w reflex microscopic -     Urine Culture   Further disposition pending results of labs. Discussed med's effects and SE's.   Over 30 minutes of exam, counseling, chart review, and critical decision making was performed.   Future Appointments  Date Time Provider [place] [facility]  06/21/2021 11:00 AM Jones Bales, PT [facility]  07/17/2021 11:00 AM [name], MD [facility] None  08/14/2021  2:00 PM [name], NP [facility] None  10/02/2021 11:00 AM [name], NP [facility] None  01/10/2022  2:30 PM [name], NP [facility] None    ------------------------------------------------------------------------------------------------------------------  HPI BP 120/72   Pulse 77   Temp 97.7 F (36.5 C)   Ht 5\' 4"  (1.626 m)   Wt 148 lb (67.1 kg)   SpO2 97%   BMI 25.40 kg/m  87 y.o.female presents for evaluation of left sided back/flank/chest pain that began yesterday. She reports began suddenly yesterday afternoon, initially mild, became much worse at night when she lay down to sleep, worse laying on that side, pain with taking breaths, radiating around to her chest. "Feels like in my left lung." She reports  had some chills this AM, had temp of 100 this AM but has resolved.   She reports she as been resting and pain is no longer constant, only with deep breaths down. She has not tried any medications for this other than position changes.   She also notes mild sense of dyspnea, fatigue. Denies cough, URI sx, GI sx (pain, n/v/d, denies blood in stool, black stools).   She has known renal cysts, no stones per [place] 06/2020, left is atrophic. Has seen nephrology.   Past Medical History:  Diagnosis Date   C. difficile colitis    Cancer [facility])    Endometrial cancer   Essential and other specified forms of tremor    Osteoporosis 11/2019   T score -2.8 stable from prior DEXA   Personal history of venous thrombosis and embolism 1997   Shingles    Stroke ([place])    Thrush    TIA (transient ischemic attack)      Allergies  Allergen Reactions   Codeine Hives   Hctz [Hydrochlorothiazide] Other (See Comments)    Low sodium   Meloxicam Nausea Only    Gastritis and nausea   Mysoline [Primidone]    Pneumovax 23 [Pneumococcal Vac Polyvalent]     Red, raised area.  Knot at site of injection   Toradol [Ketorolac Tromethamine] Swelling    Localized redness at injection site.     Ace Inhibitors Cough    Current Outpatient Medications on File Prior to Visit  Medication Sig   aspirin EC 81 MG  tablet Take 1 tablet (81 mg total) by mouth daily. Swallow whole.   atorvastatin (LIPITOR) 10 MG tablet Take 1 tablet Daily for Cholesterol   Calcium-Magnesium-Vitamin D 300-20-200 MG-MG-UNIT CHEW Chew 1 tablet daily by mouth.    Cholecalciferol (VITAMIN D3) 5000 units TABS Take 2 tablets by mouth daily.    clonazePAM (KLONOPIN) 0.5 MG tablet TAKE 1 TABLET BY MOUTH EVERY MORNING AND 1/2 TABLET AT NOON AND 1/2 AT NIGHT   Magnesium 250 MG TABS Take 250 mg by mouth 2 (two) times a day.   olmesartan (BENICAR) 5 MG tablet Take 1 tablet by mouth daily as needed. For blood pressure only if 150/90+   propranolol (INDERAL)  40 MG tablet TAKE 1 TABLET BY MOUTH FOUR TIMES DAILY WITH MEALS AND BEDTIME FOR BLOOD PRESSURE AND TREMOR   topiramate (TOPAMAX) 25 MG tablet TAKE 1 TABLET(25 MG) BY MOUTH AT BEDTIME   vitamin B-12 (CYANOCOBALAMIN) 500 MCG tablet Take 500 mcg by mouth daily. Sublingual   No current facility-administered medications on file prior to visit.    ROS: Review of Systems  Constitutional:  Positive for chills, fever and malaise/fatigue. Negative for diaphoresis and weight loss.  HENT: Negative.  Negative for congestion and sore throat.   Respiratory:  Positive for shortness of breath (mild). Negative for cough, hemoptysis, sputum production and wheezing.   Cardiovascular:  Positive for chest pain (left sided, worse with inspiration). Negative for palpitations, orthopnea, leg swelling and PND.  Gastrointestinal:  Negative for abdominal pain, blood in stool, constipation, diarrhea, heartburn, melena, nausea and vomiting.  Genitourinary:  Positive for flank pain (left). Negative for dysuria, hematuria and urgency.  Musculoskeletal:  Positive for back pain (? left thoracic/flank area). Negative for falls and myalgias.  Skin:  Negative for rash.  Neurological:  Negative for tingling, sensory change, speech change, focal weakness, weakness and headaches.    Physical Exam:  BP 120/72   Pulse 77   Temp 97.7 F (36.5 C)   Ht 5\' 4"  (1.626 m)   Wt 148 lb (67.1 kg)   SpO2 97%   BMI 25.40 kg/m   General Appearance: Well nourished, well dressed elder female, in no apparent distress. Eyes: PERRLA, conjunctiva no swelling or erythema Sinuses: No Frontal/maxillary tenderness ENT/Mouth: Ext aud canals clear, TMs without erythema, bulging. No erythema, swelling, or exudate on post pharynx.  Tonsils not swollen or erythematous. Hearing normal.  Neck: Supple, thyroid normal.  Respiratory: Respiratory effort normal, BS equal bilaterally without rales, rhonchi, wheezing or stridor.  Cardio: RRR with no MRGs.  Brisk peripheral pulses without edema.  Abdomen: Soft, + BS.  Non tender, no guarding, rebound, hernias, masses. She does have sharp L CVA tenderness.  Lymphatics: Non tender without lymphadenopathy.  Musculoskeletal: ? vaguely tender left mid back without point tenderness, muscle spasm, non-localized Skin: Warm, dry without rashes, lesions, ecchymosis.  Neuro: Normal muscle tone Psych: Awake and oriented X 3, normal affect, Insight and Judgment appropriate.     Izora Ribas, NP 2:25 PM [facility]

## 2021-06-14 ENCOUNTER — Encounter (HOSPITAL_COMMUNITY): Payer: Self-pay

## 2021-06-14 ENCOUNTER — Emergency Department (HOSPITAL_COMMUNITY): Payer: Medicare PPO

## 2021-06-14 ENCOUNTER — Other Ambulatory Visit: Payer: Self-pay

## 2021-06-14 ENCOUNTER — Observation Stay (HOSPITAL_COMMUNITY)
Admission: EM | Admit: 2021-06-14 | Discharge: 2021-06-16 | Disposition: A | Discharge status: Home / Self Care | Payer: Medicare PPO | Attending: Emergency Medicine | Admitting: Emergency Medicine

## 2021-06-14 DIAGNOSIS — G250 Essential tremor: Secondary | ICD-10-CM | POA: Diagnosis present

## 2021-06-14 DIAGNOSIS — Z79899 Other long term (current) drug therapy: Secondary | ICD-10-CM | POA: Diagnosis not present

## 2021-06-14 DIAGNOSIS — I471 Supraventricular tachycardia, unspecified: Secondary | ICD-10-CM | POA: Diagnosis present

## 2021-06-14 DIAGNOSIS — I4510 Unspecified right bundle-branch block: Secondary | ICD-10-CM | POA: Diagnosis present

## 2021-06-14 DIAGNOSIS — I2699 Other pulmonary embolism without acute cor pulmonale: Secondary | ICD-10-CM | POA: Diagnosis not present

## 2021-06-14 DIAGNOSIS — Z7982 Long term (current) use of aspirin: Secondary | ICD-10-CM | POA: Insufficient documentation

## 2021-06-14 DIAGNOSIS — Z8673 Personal history of transient ischemic attack (TIA), and cerebral infarction without residual deficits: Secondary | ICD-10-CM | POA: Diagnosis not present

## 2021-06-14 DIAGNOSIS — R079 Chest pain, unspecified: Secondary | ICD-10-CM | POA: Diagnosis not present

## 2021-06-14 DIAGNOSIS — N281 Cyst of kidney, acquired: Secondary | ICD-10-CM | POA: Diagnosis not present

## 2021-06-14 DIAGNOSIS — N183 Chronic kidney disease, stage 3 unspecified: Secondary | ICD-10-CM

## 2021-06-14 DIAGNOSIS — R7303 Prediabetes: Secondary | ICD-10-CM | POA: Diagnosis not present

## 2021-06-14 DIAGNOSIS — I479 Paroxysmal tachycardia, unspecified: Secondary | ICD-10-CM | POA: Diagnosis not present

## 2021-06-14 DIAGNOSIS — J9811 Atelectasis: Secondary | ICD-10-CM | POA: Diagnosis not present

## 2021-06-14 DIAGNOSIS — R0602 Shortness of breath: Secondary | ICD-10-CM | POA: Diagnosis not present

## 2021-06-14 DIAGNOSIS — I2694 Multiple subsegmental pulmonary emboli without acute cor pulmonale: Secondary | ICD-10-CM | POA: Diagnosis not present

## 2021-06-14 DIAGNOSIS — R2689 Other abnormalities of gait and mobility: Secondary | ICD-10-CM | POA: Diagnosis not present

## 2021-06-14 DIAGNOSIS — Z8542 Personal history of malignant neoplasm of other parts of uterus: Secondary | ICD-10-CM | POA: Diagnosis not present

## 2021-06-14 DIAGNOSIS — I129 Hypertensive chronic kidney disease with stage 1 through stage 4 chronic kidney disease, or unspecified chronic kidney disease: Secondary | ICD-10-CM | POA: Diagnosis not present

## 2021-06-14 DIAGNOSIS — N1832 Chronic kidney disease, stage 3b: Secondary | ICD-10-CM | POA: Diagnosis not present

## 2021-06-14 DIAGNOSIS — I10 Essential (primary) hypertension: Secondary | ICD-10-CM | POA: Diagnosis not present

## 2021-06-14 DIAGNOSIS — Z20822 Contact with and (suspected) exposure to covid-19: Secondary | ICD-10-CM | POA: Diagnosis not present

## 2021-06-14 DIAGNOSIS — E782 Mixed hyperlipidemia: Secondary | ICD-10-CM | POA: Diagnosis not present

## 2021-06-14 DIAGNOSIS — J90 Pleural effusion, not elsewhere classified: Secondary | ICD-10-CM | POA: Diagnosis not present

## 2021-06-14 HISTORY — DX: Other pulmonary embolism without acute cor pulmonale: I26.99

## 2021-06-14 LAB — CBC WITH DIFFERENTIAL/PLATELET
Abs Immature Granulocytes: 0.02 10*3/uL (ref 0.00–0.07)
Absolute Monocytes: 948 cells/uL (ref 200–950)
Basophils Absolute: 49 cells/uL (ref 0–200)
Basophils Absolute: 0.1 10*3/uL (ref 0.0–0.1)
Basophils Relative: 0.6 %
Basophils Relative: 1 %
Eosinophils Absolute: 49 cells/uL (ref 15–500)
Eosinophils Absolute: 0.1 10*3/uL (ref 0.0–0.5)
Eosinophils Relative: 0.6 %
Eosinophils Relative: 1 %
HCT: 46.8 % — ABNORMAL HIGH (ref 35.0–45.0)
HCT: 45.8 % (ref 36.0–46.0)
Hemoglobin: 15.1 g/dL (ref 11.7–15.5)
Hemoglobin: 14.7 g/dL (ref 12.0–15.0)
Immature Granulocytes: 0 %
Lymphocytes Relative: 27 %
Lymphs Abs: 1879 cells/uL (ref 850–3900)
Lymphs Abs: 2 10*3/uL (ref 0.7–4.0)
MCH: 28.3 pg (ref 27.0–33.0)
MCH: 28.7 pg (ref 26.0–34.0)
MCHC: 32.3 g/dL (ref 32.0–36.0)
MCHC: 32.1 g/dL (ref 30.0–36.0)
MCV: 87.8 fL (ref 80.0–100.0)
MCV: 89.5 fL (ref 80.0–100.0)
MPV: 10 fL (ref 7.5–12.5)
Monocytes Absolute: 0.9 10*3/uL (ref 0.1–1.0)
Monocytes Relative: 11.7 %
Monocytes Relative: 12 %
Neutro Abs: 5176 cells/uL (ref 1500–7800)
Neutro Abs: 4.2 10*3/uL (ref 1.7–7.7)
Neutrophils Relative %: 63.9 %
Neutrophils Relative %: 59 %
Platelets: 330 10*3/uL (ref 140–400)
Platelets: 320 10*3/uL (ref 150–400)
RBC: 5.33 10*6/uL — ABNORMAL HIGH (ref 3.80–5.10)
RBC: 5.12 MIL/uL — ABNORMAL HIGH (ref 3.87–5.11)
RDW: 14 % (ref 11.0–15.0)
RDW: 14.5 % (ref 11.5–15.5)
Total Lymphocyte: 23.2 %
WBC: 8.1 10*3/uL (ref 3.8–10.8)
WBC: 7.2 10*3/uL (ref 4.0–10.5)
nRBC: 0 % (ref 0.0–0.2)

## 2021-06-14 LAB — COMPLETE METABOLIC PANEL WITH GFR
AG Ratio: 1.8 (calc) (ref 1.0–2.5)
ALT: 12 U/L (ref 6–29)
AST: 16 U/L (ref 10–35)
Albumin: 4.2 g/dL (ref 3.6–5.1)
Alkaline phosphatase (APISO): 38 U/L (ref 37–153)
BUN/Creatinine Ratio: 14 (calc) (ref 6–22)
BUN: 18 mg/dL (ref 7–25)
CO2: 26 mmol/L (ref 20–32)
Calcium: 9.8 mg/dL (ref 8.6–10.4)
Chloride: 104 mmol/L (ref 98–110)
Creat: 1.25 mg/dL — ABNORMAL HIGH (ref 0.60–0.88)
GFR, Est African American: 45 mL/min/{1.73_m2} — ABNORMAL LOW (ref >=60)
GFR, Est Non African American: 39 mL/min/{1.73_m2} — ABNORMAL LOW (ref >=60)
Globulin: 2.3 g/dL (calc) (ref 1.9–3.7)
Glucose, Bld: 114 mg/dL — ABNORMAL HIGH (ref 65–99)
Potassium: 4.6 mmol/L (ref 3.5–5.3)
Sodium: 138 mmol/L (ref 135–146)
Total Bilirubin: 0.8 mg/dL (ref 0.2–1.2)
Total Protein: 6.5 g/dL (ref 6.1–8.1)

## 2021-06-14 LAB — TROPONIN I (HIGH SENSITIVITY): Troponin I (High Sensitivity): 4 ng/L (ref <18)

## 2021-06-14 LAB — BRAIN NATRIURETIC PEPTIDE: B Natriuretic Peptide: 138.3 pg/mL — ABNORMAL HIGH (ref 0.0–100.0)

## 2021-06-14 LAB — URINALYSIS, ROUTINE W REFLEX MICROSCOPIC
Bacteria, UA: NONE SEEN /HPF
Bilirubin Urine: NEGATIVE
Glucose, UA: NEGATIVE
Hgb urine dipstick: NEGATIVE
Hyaline Cast: NONE SEEN /LPF
Ketones, ur: NEGATIVE
Nitrite: NEGATIVE
Protein, ur: NEGATIVE
RBC / HPF: NONE SEEN /HPF (ref 0–2)
Specific Gravity, Urine: 1.008 (ref 1.001–1.035)
Squamous Epithelial / HPF: NONE SEEN /HPF (ref <=5)
pH: 6 (ref 5.0–8.0)

## 2021-06-14 LAB — BASIC METABOLIC PANEL
Anion gap: 8 (ref 5–15)
BUN: 19 mg/dL (ref 8–23)
CO2: 26 mmol/L (ref 22–32)
Calcium: 9.2 mg/dL (ref 8.9–10.3)
Chloride: 100 mmol/L (ref 98–111)
Creatinine, Ser: 1.17 mg/dL — ABNORMAL HIGH (ref 0.44–1.00)
GFR, Estimated: 45 mL/min — ABNORMAL LOW (ref >60)
Glucose, Bld: 119 mg/dL — ABNORMAL HIGH (ref 70–99)
Potassium: 4.2 mmol/L (ref 3.5–5.1)
Sodium: 134 mmol/L — ABNORMAL LOW (ref 135–145)

## 2021-06-14 LAB — URINE CULTURE
MICRO NUMBER:: 12087747
SPECIMEN QUALITY:: ADEQUATE

## 2021-06-14 LAB — MICROSCOPIC MESSAGE

## 2021-06-14 LAB — PROTIME-INR
INR: 1.2 (ref 0.8–1.2)
Prothrombin Time: 15.2 seconds (ref 11.4–15.2)

## 2021-06-14 LAB — APTT: aPTT: 29 seconds (ref 24–36)

## 2021-06-14 MED ORDER — PROPRANOLOL HCL 20 MG PO TABS
40.0000 mg | ORAL_TABLET | Freq: Four times a day (QID) | ORAL | Status: DC
  Administered 2021-06-14 – 2021-06-16 (×6): 40 mg via ORAL
  Filled 2021-06-14 (×6): qty 2

## 2021-06-14 MED ORDER — ACETAMINOPHEN 325 MG PO TABS: 650.0000 mg | ORAL_TABLET | Freq: Four times a day (QID) | ORAL | Status: DC | PRN

## 2021-06-14 MED ORDER — TOPIRAMATE 25 MG PO TABS
25.0000 mg | ORAL_TABLET | Freq: Every day | ORAL | Status: DC
  Administered 2021-06-14 – 2021-06-16 (×3): 25 mg via ORAL
  Filled 2021-06-14 (×3): qty 1

## 2021-06-14 MED ORDER — SODIUM CHLORIDE 0.9 % IV SOLN
75.0000 mL/h | INTRAVENOUS | Status: DC
  Administered 2021-06-15: 75 mL/h via INTRAVENOUS

## 2021-06-14 MED ORDER — CLONAZEPAM 0.5 MG PO TABS: 0.2500 mg | ORAL_TABLET | ORAL | Status: DC

## 2021-06-14 MED ORDER — CLONAZEPAM 0.125 MG PO TBDP
0.2500 mg | ORAL_TABLET | Freq: Every day | ORAL | Status: DC
  Administered 2021-06-15: 0.25 mg via ORAL
  Filled 2021-06-14: qty 2

## 2021-06-14 MED ORDER — HYPROMELLOSE (GONIOSCOPIC) 2.5 % OP SOLN: 1.0000 [drp] | Freq: Every day | OPHTHALMIC | Status: DC | PRN

## 2021-06-14 MED ORDER — CLONAZEPAM 0.5 MG PO TABS
0.5000 mg | ORAL_TABLET | Freq: Every day | ORAL | Status: DC
  Administered 2021-06-15: 0.5 mg via ORAL
  Filled 2021-06-14: qty 1

## 2021-06-14 MED ORDER — POLYVINYL ALCOHOL 1.4 % OP SOLN
1.0000 [drp] | OPHTHALMIC | Status: DC | PRN
  Filled 2021-06-14: qty 15

## 2021-06-14 MED ORDER — IOHEXOL 350 MG/ML SOLN
100.0000 mL | Freq: Once | INTRAVENOUS | Status: AC | PRN
  Administered 2021-06-14: 75 mL via INTRAVENOUS

## 2021-06-14 MED ORDER — CLONAZEPAM 0.125 MG PO TBDP
0.2500 mg | ORAL_TABLET | Freq: Every day | ORAL | Status: DC
  Administered 2021-06-14: 0.25 mg via ORAL
  Filled 2021-06-14: qty 2

## 2021-06-14 MED ORDER — ASPIRIN EC 81 MG PO TBEC
81.0000 mg | DELAYED_RELEASE_TABLET | Freq: Every day | ORAL | Status: DC
  Administered 2021-06-14 – 2021-06-16 (×3): 81 mg via ORAL
  Filled 2021-06-14 (×3): qty 1

## 2021-06-14 MED ORDER — ACETAMINOPHEN 650 MG RE SUPP: 650.0000 mg | Freq: Four times a day (QID) | RECTAL | Status: DC | PRN

## 2021-06-14 MED ORDER — HEPARIN (PORCINE) 25000 UT/250ML-% IV SOLN
1100.0000 [IU]/h | INTRAVENOUS | Status: AC
  Administered 2021-06-14: 950 [IU]/h via INTRAVENOUS
  Filled 2021-06-14 (×3): qty 250

## 2021-06-14 MED ORDER — HEPARIN BOLUS VIA INFUSION
2000.0000 [IU] | Freq: Once | INTRAVENOUS | Status: AC
  Administered 2021-06-14: 2000 [IU] via INTRAVENOUS
  Filled 2021-06-14: qty 2000

## 2021-06-14 MED ORDER — ATORVASTATIN CALCIUM 10 MG PO TABS
10.0000 mg | ORAL_TABLET | Freq: Every day | ORAL | Status: DC
  Administered 2021-06-15 – 2021-06-16 (×2): 10 mg via ORAL
  Filled 2021-06-14 (×3): qty 1

## 2021-06-14 NOTE — ED Notes (Signed)
Pt states she takes propranolol and has not had it today.  BP 176/94, messaged Dr.Doutova to make aware that propranolol is not ordered currently.

## 2021-06-14 NOTE — ED Provider Notes (Signed)
[facility] Provider Note   CSN: [id number] Arrival date & time: 06/14/21  27     History Chief Complaint  Patient presents with   Shortness of Breath   Chest Pain   Back Pain    Monica Curry is a 87 y.o. female.  HPI Elderly female presents with dyspnea, thoracic pain.  Onset was 2 days ago, without clear precipitant.  Since that time she has had pain focally in the left axilla, with some radiation anterior and posterior. Pain is worse with inspiration.  No syncope, no other chest pain.  Pain is sore, persistent, worse with motion as well. She does have a personal history of pulmonary embolism. No skin changes, notable as the patient has a history of shingles as well. She saw her physician yesterday had reportedly unremarkable evaluation including x-ray.  Today with persistent symptoms she was encouraged to go to the emergency department for evaluation.    Past Medical History:  Diagnosis Date   C. difficile colitis    Cancer [facility])    Endometrial cancer   Essential and other specified forms of tremor    Osteoporosis 11/2019   T score -2.8 stable from prior DEXA   Personal history of venous thrombosis and embolism 1997   Pulmonary embolus ([facility])    Shingles    Stroke ([facility])    Thrush    TIA (transient ischemic attack)     Patient Active Problem List   Diagnosis Date Noted   Bilateral renal cysts 07/06/2020   Paroxysmal supraventricular tachycardia ([facility]) 10/20/2019   History of skin cancer (basal cell) 06/16/2019   Complete right bundle branch block 06/16/2019   B12 deficiency 06/15/2019   History of TIA (transient ischemic attack) 10/23/2017   Chronic kidney disease, stage III (moderate) ([facility]) 07/21/2015   Medication management 07/04/2014   Essential hypertension 11/25/2013   Hyperlipidemia, mixed 11/25/2013   Abnormal glucose 11/25/2013   Vitamin D deficiency 11/25/2013   Essential tremor 11/25/2013   History of  endometrial cancer    Hx/o DVT/PE    Urethral prolapse    Osteoporosis 10/10/2011    Past Surgical History:  Procedure Laterality Date   APPENDECTOMY  1961   cataract surg Bilateral 2021   Dr. Gershon Curry, R eye 8/18, L 8/25   CESAREAN SECTION  1961   TONSILLECTOMY AND ADENOIDECTOMY     TOTAL ABDOMINAL HYSTERECTOMY W/ BILATERAL SALPINGOOPHORECTOMY  1997     OB History     Gravida  2   Para  2   Term  2   Preterm      AB      Living  1      SAB      IAB      Ectopic      Multiple      Live Births              Family History  Problem Relation Age of Onset   Cancer Mother        Unknown type   Dementia Mother    Cancer Father        Bone   Heart disease Brother    Prostate cancer Brother        Prostate   Heart disease Brother    Kidney failure Brother    Prostate cancer Brother        Prostate   Heart disease Sister    Blindness Sister 6       temporal  arteritis   Skin cancer Sister        basal and melanoma   Heart disease Sister    Kidney failure Sister        Kidney transplant   Diabetes Sister    Hypertension Sister    Heart disease Brother     Social History   Tobacco Use   Smoking status: Never   Smokeless tobacco: Never  Vaping Use   Vaping Use: Never used  Substance Use Topics   Alcohol use: Not Currently    Alcohol/week: 0.0 standard drinks   Drug use: No    Home Medications Prior to Admission medications   Medication Sig Start Date End Date Taking? Authorizing Provider  acetaminophen (TYLENOL) 500 MG tablet Take 500 mg by mouth every 6 (six) hours as needed for moderate pain.   Yes [provider]  aspirin EC 81 MG tablet Take 1 tablet (81 mg total) by mouth daily. Swallow whole. 04/17/21  Yes Fay Records, MD  atorvastatin (LIPITOR) 10 MG tablet Take 1 tablet Daily for Cholesterol Patient taking differently: Take 10 mg by mouth daily. Take 1 tablet Daily for Cholesterol 05/28/21  Yes Unk Pinto, MD   Calcium-Magnesium-Vitamin D 300-20-200 MG-MG-UNIT CHEW Chew 1 tablet daily by mouth.    Yes [provider]  Cholecalciferol (VITAMIN D3) 5000 units TABS Take 2 tablets by mouth daily.    Yes [provider]  clonazePAM (KLONOPIN) 0.5 MG tablet TAKE 1 TABLET BY MOUTH EVERY MORNING AND 1/2 TABLET AT NOON AND 1/2 TABLET AT NIGHT Patient taking differently: Take 0.25-0.5 mg by mouth See admin instructions. TAKE 1 TABLET BY MOUTH EVERY MORNING AND 1/2 TABLET AT NOON AND 1/2 TABLET AT NIGHT 06/13/21  Yes [name], NP  hydroxypropyl methylcellulose / hypromellose (ISOPTO TEARS / GONIOVISC) 2.5 % ophthalmic solution Place into both eyes daily as needed for dry eyes.   Yes [provider]  Magnesium 250 MG TABS Take 250 mg by mouth 2 (two) times a day.   Yes [provider]  olmesartan (BENICAR) 5 MG tablet Take 5 mg by mouth daily as needed (if blood pressure is too high). For blood pressure only if 150/90+   Yes [provider]  propranolol (INDERAL) 40 MG tablet TAKE 1 TABLET BY MOUTH FOUR TIMES DAILY WITH MEALS AND BEDTIME FOR BLOOD PRESSURE AND TREMOR Patient taking differently: Take 40 mg by mouth 4 (four) times daily. 02/15/21  Yes [name], NP  topiramate (TOPAMAX) 25 MG tablet TAKE 1 TABLET(25 MG) BY MOUTH AT BEDTIME Patient taking differently: Take 25 mg by mouth daily. 03/12/21  Yes [name], NP  vitamin B-12 (CYANOCOBALAMIN) 500 MCG tablet Take 500 mcg by mouth daily. Sublingual   Yes [provider]    Allergies    Codeine, Hctz [hydrochlorothiazide], Meloxicam, Mysoline [primidone], Pneumovax 23 [pneumococcal vac polyvalent], Toradol [ketorolac tromethamine], and Ace inhibitors  Review of Systems   Review of Systems  Constitutional:        Per HPI, otherwise negative  HENT:         Per HPI, otherwise negative  Respiratory:         Per HPI, otherwise negative  Cardiovascular:        Per HPI, otherwise negative   Gastrointestinal:  Negative for vomiting.  Endocrine:       Negative aside from HPI  Genitourinary:        Neg aside from HPI   Musculoskeletal:  Per HPI, otherwise negative  Skin: Negative.   Neurological:  Negative for syncope.   Physical Exam Updated Vital Signs BP (!) 184/97   Pulse 74   Temp 98 F (36.7 C) (Oral)   Resp (!) 21   Ht 5\' 4"  (1.626 m)   Wt 67.1 kg   SpO2 100%   BMI 25.40 kg/m   Physical Exam Vitals and nursing note reviewed.  Constitutional:      General: She is not in acute distress.    Appearance: She is well-developed.  HENT:     Head: Normocephalic and atraumatic.  Eyes:     Conjunctiva/sclera: Conjunctivae normal.  Cardiovascular:     Rate and Rhythm: Normal rate and regular rhythm.  Pulmonary:     Effort: Pulmonary effort is normal. No respiratory distress.     Breath sounds: Normal breath sounds. No stridor.  Abdominal:     General: There is no distension.  Skin:    General: Skin is warm and dry.  Neurological:     Mental Status: She is alert and oriented to person, place, and time.     Cranial Nerves: No cranial nerve deficit.    ED Results / Procedures / Treatments   Labs (all labs ordered are listed, but only abnormal results are displayed) Labs Reviewed  BASIC METABOLIC PANEL - Abnormal; Notable for the following components:      Result Value   Sodium 134 (*)    Glucose, Bld 119 (*)    Creatinine, Ser 1.17 (*)    GFR, Estimated 45 (*)    All other components within normal limits  BRAIN NATRIURETIC PEPTIDE - Abnormal; Notable for the following components:   B Natriuretic Peptide 138.3 (*)    All other components within normal limits  CBC WITH DIFFERENTIAL/PLATELET - Abnormal; Notable for the following components:   RBC 5.12 (*)    All other components within normal limits  SARS CORONAVIRUS 2 (TAT 6-24 HRS)  APTT  PROTIME-INR    EKG EKG Interpretation  Date/Time:  Thursday June 14 2021 15:46:10 EDT Ventricular  Rate:  77 PR Interval:  208 QRS Duration: 132 QT Interval:  418 QTC Calculation: 473 R Axis:   110 Text Interpretation: Normal sinus rhythm Right bundle branch block Left posterior fascicular block Bifascicular block No significant change since last tracing Abnormal ECG Confirmed by Carmin Muskrat [phone]) on 06/14/2021 4:45:11 PM  Radiology DG Chest 2 View  Result Date: 06/14/2021 CLINICAL DATA:  Shortness of breath. EXAM: CHEST - 2 VIEW COMPARISON:  06/13/2021 FINDINGS: The cardiac silhouette, mediastinal and hilar contours are within normal limits is stable. Chronic left basilar pleural thickening and parenchymal scarring. No acute pulmonary findings. No worrisome pulmonary lesions. The bony thorax is intact. IMPRESSION: Chronic left basilar pleural thickening and parenchymal scarring. No acute pulmonary findings. Electronically Signed   By: Marijo Sanes M.D.   On: 06/14/2021 16:14   DG Chest 2 View  Result Date: 06/14/2021 CLINICAL DATA:  Chest pain. EXAM: CHEST - 2 VIEW COMPARISON:  February 08, 2020. FINDINGS: The heart size and mediastinal contours are within normal limits. Minimal bibasilar subsegmental atelectasis is noted. The visualized skeletal structures are unremarkable. IMPRESSION: Minimal bibasilar subsegmental atelectasis. Electronically Signed   By: Marijo Conception M.D.   On: 06/14/2021 10:32   CT Angio Chest PE W and/or Wo Contrast  Result Date: 06/14/2021 CLINICAL DATA:  Shortness of breath and left-sided chest pain. EXAM: CT ANGIOGRAPHY CHEST WITH CONTRAST TECHNIQUE: Multidetector CT imaging  of the chest was performed using the standard protocol during bolus administration of intravenous contrast. Multiplanar CT image reconstructions and MIPs were obtained to evaluate the vascular anatomy. CONTRAST:  47mL OMNIPAQUE IOHEXOL 350 MG/ML SOLN COMPARISON:  Radiograph earlier today. FINDINGS: Cardiovascular: Examination is positive for acute pulmonary embolus. There are filling defects  within the right and left pulmonary arterial system. On the right this involves the distal main pulmonary artery extending into all lobar and many segmental branches. On the left this involves the left lower lobe are branches, near occlusive, extending into the segmental and subsegmental branches. There is subsegmental left upper lobe involvement. Thromboembolic burden is moderate. There is right heart strain with RV to LV ratio of 1.2. Upper normal heart size. No pericardial effusion. Moderate aortic atherosclerosis with tortuosity. Conventional branching pattern from the aortic arch. No aortic dissection. Mediastinum/Nodes: No enlarged mediastinal or hilar lymph nodes. No esophageal wall thickening. Heterogeneously enlarged right lobe of the thyroid gland without definite dominant nodule. Lungs/Pleura: Small left greater than right pleural effusion with associated atelectasis. No evidence of pulmonary infarct. No pulmonary edema. No pulmonary mass. Motion artifact limits detailed assessment of the basis. Upper Abdomen: Motion obscured, bilateral renal cysts. Musculoskeletal: Exaggerated thoracic kyphosis with multilevel degenerative change. There are no acute or suspicious osseous abnormalities. Unremarkable chest wall soft tissues. Review of the MIP images confirms the above findings. IMPRESSION: 1. Examination is positive for acute pulmonary embolus with moderate clot burden and evidence of right heart strain, RV to LV ratio of 1.2. 2. Small left greater than right pleural effusions with associated atelectasis. Aortic Atherosclerosis (ICD10-I70.0). Critical Value/emergent results were called by telephone at the time of interpretation on 06/14/2021 at 5:54 pm to Dr Carmin Muskrat , who verbally acknowledged these results. Electronically Signed   By: Keith Rake M.D.   On: 06/14/2021 17:55    Procedures Procedures   Medications Ordered in ED Medications  heparin bolus via infusion 2,000 Units (has no  administration in time range)  heparin ADULT infusion 100 units/mL (25000 units/210mL) (has no administration in time range)  iohexol (OMNIPAQUE) 350 MG/ML injection 100 mL (75 mLs Intravenous Contrast Given 06/14/21 1700)    ED Course  I have reviewed the triage vital signs and the nursing notes.  Pertinent labs & imaging results that were available during my care of the patient were reviewed by me and considered in my medical decision making (see chart for details).  Cardiac 70 sinus unremarkable Pulse ox 100% room air normal     7:02 PM Patient in no distress, awake, alert.  She and I discussed today's findings, I have already discussed CT results with the radiologist.  Patient found to have bilateral pulmonary embolism.  Patient and I discussed her prior episode of PE, which was 25 years ago, possibly related to hysterectomy several weeks prior. She notes that she had been reasonably well recently, had there is no clear precipitant for today's findings. Patient aware of need for admission. Heparin started.   MDM Rules/Calculators/A&P MDM Number of Diagnoses or Management Options Multiple subsegmental pulmonary emboli without acute cor pulmonale ([place]): new, needed workup   Amount and/or Complexity of Data Reviewed Clinical lab tests: ordered and reviewed Tests in the radiology section of CPT: ordered and reviewed Tests in the medicine section of CPT: reviewed and ordered Discussion of test results with the performing providers: yes Decide to obtain previous medical records or to obtain history from someone other than the patient: yes Review and summarize  past medical records: yes Discuss the patient with other providers: yes Independent visualization of images, tracings, or specimens: yes  Risk of Complications, Morbidity, and/or Mortality Presenting problems: high Diagnostic procedures: high Management options: high  Critical Care Total time providing critical care:  30-74 minutes (35)  Patient Progress Patient progress: stable   Final Clinical Impression(s) / ED Diagnoses Final diagnoses:  Multiple subsegmental pulmonary emboli without acute cor pulmonale ([place])     Carmin Muskrat, MD 06/14/21 [phone]

## 2021-06-14 NOTE — H&P (Signed)
Monica Curry ZTI:[id number] DOB: [birth year] DOA: 06/14/2021     PCP: Unk Pinto, MD   Outpatient Specialists:  CARDS:   Dr. Harrington Challenger NEphrology:    Dr.    Patient arrived to ER on 06/14/21 at 1528 Referred by Attending Carmin Muskrat, MD   Patient coming from: home Lives alone   Chief Complaint:   Chief Complaint  Patient presents with   Shortness of Breath   Chest Pain   Back Pain    HPI: Monica Curry is a 87 y.o. female with medical history significant of hx of PE 53 y ago no longer on anticoagulation, endometrial ca sp ressection, HTN, CKD 3b, CVA, HLD, pre diabeites, vertigo  RBBB, PSVT  Presented with 2 day hx of chest and SOB Associated with back pain, was seen by PCP, CXR WNL  since no improvement was sent to ER No travel  Infectious risk factors:  Reports shortness of breath,   chest pain,     Has been vaccinated against COVID  and boosted  in house  PCR testing  Pending  No results found for: SARSCOV2NAA   Regarding pertinent Chronic problems:     Hyperlipidemia -   on statins lipitor Lipid Panel     Component Value Date/Time   CHOL 117 04/10/2021 1426   TRIG 94 04/10/2021 1426   HDL 47 (L) 04/10/2021 1426   CHOLHDL 2.5 04/10/2021 1426   VLDL 24 10/24/2017 0629   LDLCALC 52 04/10/2021 1426    HTN on benicar, inderal,      Hx of TIA-  with/out residual deficits on Aspirin 81 mg,      Hx of DVT/PE  1997on - no longer on anticoagulation    CKD stage IIIb- baseline Cr 1.2 Estimated Creatinine Clearance: 31.9 mL/min (A) (by C-G formula based on SCr of 1.17 mg/dL (H)).  Lab Results  Component Value Date   CREATININE 1.17 (H) 06/14/2021   CREATININE 1.25 (H) 06/13/2021   CREATININE 1.39 (H) 04/10/2021     While in ER: CTA found bilateral PE no hemodynamic instability, heparin ordered     ED Triage Vitals  Enc Vitals Group     BP 06/14/21 1540 (!) 154/75     Pulse Rate 06/14/21 1540 79     Resp 06/14/21 1540 16     Temp  06/14/21 1540 98 F (36.7 C)     Temp Source 06/14/21 1540 Oral     SpO2 06/14/21 1645 99 %     Weight 06/14/21 1541 148 lb (67.1 kg)     Height 06/14/21 1541 5\' 4"  (1.626 m)     Head Circumference --      Peak Flow --      Pain Score 06/14/21 1540 0     Pain Loc --      Pain Edu? --      Excl. in [place]? --   TMAX(24)@     _________________________________________ Significant initial  Findings: Abnormal Labs Reviewed  BASIC METABOLIC PANEL - Abnormal; Notable for the following components:      Result Value   Sodium 134 (*)    Glucose, Bld 119 (*)    Creatinine, Ser 1.17 (*)    GFR, Estimated 45 (*)    All other components within normal limits  BRAIN NATRIURETIC PEPTIDE - Abnormal; Notable for the following components:   B Natriuretic Peptide 138.3 (*)    All other components within normal limits  CBC WITH DIFFERENTIAL/PLATELET - Abnormal; Notable for  the following components:   RBC 5.12 (*)    All other components within normal limits   ____________________________________________ Ordered   CXR -  NON acute    CTA chest -  PE,  no evidence of infiltrate   _________________________ Troponin 4 ECG: Ordered Personally reviewed by me showing: HR : 77 Rhythm:   SR,   RBBB,    no evidence of ischemic changes QTC 473 ____________________ This patient meets SIRS Criteria and may be septic. SIRS = Systemic Inflammatory Response Syndrome  Order a lactic acid level if needed AND/OR Initiate the sepsis protocol with the attached order set OR Click "Treating Associated Infection or Illness" if the patient is being treated for an infection that is a known cause of these abnormalities     The recent clinical data is shown below. Vitals:   06/14/21 1541 06/14/21 1645 06/14/21 1740 06/14/21 1830  BP:  (!) 103/93 (!) 142/69 (!) 184/97  Pulse:  74 73 74  Resp:  19 (!) 25 (!) 21  Temp:      TempSrc:      SpO2:  99% 100% 100%  Weight: 67.1 kg     Height: 5\' 4"  (1.626 m)        WBC     Component Value Date/Time   WBC 7.2 06/14/2021 1607   LYMPHSABS 2.0 06/14/2021 1607   MONOABS 0.9 06/14/2021 1607   EOSABS 0.1 06/14/2021 1607   BASOSABS 0.1 06/14/2021 1607        UA  no evidence of UTI     Urine analysis:    Component Value Date/Time   COLORURINE YELLOW 06/13/2021 1529   APPEARANCEUR CLEAR 06/13/2021 1529   LABSPEC 1.008 06/13/2021 1529   PHURINE 6.0 06/13/2021 1529   GLUCOSEU NEGATIVE 06/13/2021 1529   HGBUR NEGATIVE 06/13/2021 1529   BILIRUBINUR NEGATIVE 05/06/2017 1603   KETONESUR NEGATIVE 06/13/2021 1529   PROTEINUR NEGATIVE 06/13/2021 1529   UROBILINOGEN 0.2 04/19/2015 1103   NITRITE NEGATIVE 06/13/2021 1529   LEUKOCYTESUR 2+ (A) 06/13/2021 1529    Results for orders placed or performed in visit on 06/13/21  MICROSCOPIC MESSAGE     Status: None   Collection Time: 06/13/21  3:29 PM  Result Value Ref Range Status   Note   Final    Comment: This urine was analyzed for the presence of WBC,  RBC, bacteria, casts, and other formed elements.  Only those elements seen were reported. . .      _______________________________________________ Hospitalist was called for admission for PE  The following Work up has been ordered so far:  Orders Placed This Encounter  Procedures   SARS CORONAVIRUS 2 (TAT 6-24 HRS) Nasopharyngeal Nasopharyngeal Swab   DG Chest 2 View   CT Angio Chest PE W and/or Wo Contrast   Basic metabolic panel   Brain natriuretic peptide   CBC with Differential   Cardiac monitoring   Utilize spacer/aerochamber with mdi inhaler for COVID-19 positive patients or PUI for COVID-19   If O2 Sat <94% administer O2 at 2 liters/minute via nasal cannula   heparin per pharmacy consult   Consult to hospitalist   Pulse oximetry, continuous   Adult wheeze protocol - initiate by RT   ED EKG   Insert peripheral IV      Following Medications were ordered in ER: Medications  iohexol (OMNIPAQUE) 350 MG/ML injection 100 mL (75  mLs Intravenous Contrast Given 06/14/21 1700)        Consult Orders  (From  admission, onward)           Start     Ordered   06/14/21 1832  Consult to hospitalist  Once       Provider:  (Not yet assigned)  Question Answer Comment  Place call to: [facility]   Reason for Consult Admit      06/14/21 1831              OTHER Significant initial  Findings:  labs showing:    Recent Labs  Lab 06/13/21 1529 06/14/21 1607  NA 138 134*  K 4.6 4.2  CO2 26 26  GLUCOSE 114* 119*  BUN 18 19  CREATININE 1.25* 1.17*  CALCIUM 9.8 9.2    Cr  stable,    Lab Results  Component Value Date   CREATININE 1.17 (H) 06/14/2021   CREATININE 1.25 (H) 06/13/2021   CREATININE 1.39 (H) 04/10/2021    Recent Labs  Lab 06/13/21 1529  AST 16  ALT 12  BILITOT 0.8  PROT 6.5   Lab Results  Component Value Date   CALCIUM 9.2 06/14/2021       Plt: Lab Results  Component Value Date   PLT 320 06/14/2021        Recent Labs  Lab 06/13/21 1529 06/14/21 1607  WBC 8.1 7.2  NEUTROABS 5,176 4.2  HGB 15.1 14.7  HCT 46.8* 45.8  MCV 87.8 89.5  PLT 330 320    HG/HCT * stable,  Down *Up from baseline see below    Component Value Date/Time   HGB 14.7 06/14/2021 1607   HCT 45.8 06/14/2021 1607   MCV 89.5 06/14/2021 1607       Cardiac Panel (last 3 results) No results for input(s): CKTOTAL, CKMB, TROPONINI, RELINDX in the last 72 hours.   BNP (last 3 results) Recent Labs    06/14/21 1607  BNP 138.3*      DM  labs:  HbA1C: Recent Labs    06/29/20 1505 04/10/21 1426  HGBA1C 5.4 5.4       CBG (last 3)  No results for input(s): GLUCAP in the last 72 hours.        Cultures: No results found for: SDES, [place], CULT, REPTSTATUS   Radiological Exams on Admission: DG Chest 2 View  Result Date: 06/14/2021 CLINICAL DATA:  Shortness of breath. EXAM: CHEST - 2 VIEW COMPARISON:  06/13/2021 FINDINGS: The cardiac silhouette, mediastinal and hilar contours  are within normal limits is stable. Chronic left basilar pleural thickening and parenchymal scarring. No acute pulmonary findings. No worrisome pulmonary lesions. The bony thorax is intact. IMPRESSION: Chronic left basilar pleural thickening and parenchymal scarring. No acute pulmonary findings. Electronically Signed   By: Marijo Sanes M.D.   On: 06/14/2021 16:14   DG Chest 2 View  Result Date: 06/14/2021 CLINICAL DATA:  Chest pain. EXAM: CHEST - 2 VIEW COMPARISON:  February 08, 2020. FINDINGS: The heart size and mediastinal contours are within normal limits. Minimal bibasilar subsegmental atelectasis is noted. The visualized skeletal structures are unremarkable. IMPRESSION: Minimal bibasilar subsegmental atelectasis. Electronically Signed   By: Marijo Conception M.D.   On: 06/14/2021 10:32   CT Angio Chest PE W and/or Wo Contrast  Result Date: 06/14/2021 CLINICAL DATA:  Shortness of breath and left-sided chest pain. EXAM: CT ANGIOGRAPHY CHEST WITH CONTRAST TECHNIQUE: Multidetector CT imaging of the chest was performed using the standard protocol during bolus administration of intravenous contrast. Multiplanar CT image reconstructions and MIPs were obtained to evaluate  the vascular anatomy. CONTRAST:  66mL OMNIPAQUE IOHEXOL 350 MG/ML SOLN COMPARISON:  Radiograph earlier today. FINDINGS: Cardiovascular: Examination is positive for acute pulmonary embolus. There are filling defects within the right and left pulmonary arterial system. On the right this involves the distal main pulmonary artery extending into all lobar and many segmental branches. On the left this involves the left lower lobe are branches, near occlusive, extending into the segmental and subsegmental branches. There is subsegmental left upper lobe involvement. Thromboembolic burden is moderate. There is right heart strain with RV to LV ratio of 1.2. Upper normal heart size. No pericardial effusion. Moderate aortic atherosclerosis with tortuosity.  Conventional branching pattern from the aortic arch. No aortic dissection. Mediastinum/Nodes: No enlarged mediastinal or hilar lymph nodes. No esophageal wall thickening. Heterogeneously enlarged right lobe of the thyroid gland without definite dominant nodule. Lungs/Pleura: Small left greater than right pleural effusion with associated atelectasis. No evidence of pulmonary infarct. No pulmonary edema. No pulmonary mass. Motion artifact limits detailed assessment of the basis. Upper Abdomen: Motion obscured, bilateral renal cysts. Musculoskeletal: Exaggerated thoracic kyphosis with multilevel degenerative change. There are no acute or suspicious osseous abnormalities. Unremarkable chest wall soft tissues. Review of the MIP images confirms the above findings. IMPRESSION: 1. Examination is positive for acute pulmonary embolus with moderate clot burden and evidence of right heart strain, RV to LV ratio of 1.2. 2. Small left greater than right pleural effusions with associated atelectasis. Aortic Atherosclerosis (ICD10-I70.0). Critical Value/emergent results were called by telephone at the time of interpretation on 06/14/2021 at 5:54 pm to Dr Carmin Muskrat , who verbally acknowledged these results. Electronically Signed   By: Keith Rake M.D.   On: 06/14/2021 17:55   _______________________________________________________________________________________________________ Latest  Blood pressure (!) 184/97, pulse 74, temperature 98 F (36.7 C), temperature source Oral, resp. rate (!) 21, height 5\' 4"  (1.626 m), weight 67.1 kg, SpO2 100 %.   Review of Systems:    Pertinent positives include:  fatigue shortness of breath at rest.   chest pain,  Constitutional:  No weight loss, night sweats, Fevers, chills, , weight loss  HEENT:  No headaches, Difficulty swallowing,Tooth/dental problems,Sore throat,  No sneezing, itching, ear ache, nasal congestion, post nasal drip,  Cardio-vascular:  No Orthopnea, PND,  anasarca, dizziness, palpitations.no Bilateral lower extremity swelling  GI:  No heartburn, indigestion, abdominal pain, nausea, vomiting, diarrhea, change in bowel habits, loss of appetite, melena, blood in stool, hematemesis Resp:  no No dyspnea on exertion, No excess mucus, no productive cough, No non-productive cough, No coughing up of blood.No change in color of mucus.No wheezing. Skin:  no rash or lesions. No jaundice GU:  no dysuria, change in color of urine, no urgency or frequency. No straining to urinate.  No flank pain.  Musculoskeletal:  No joint pain or no joint swelling. No decreased range of motion. No back pain.  Psych:  No change in mood or affect. No depression or anxiety. No memory loss.  Neuro: no localizing neurological complaints, no tingling, no weakness, no double vision, no gait abnormality, no slurred speech, no confusion  All systems reviewed and apart from [place] all are negative _______________________________________________________________________________________________ Past Medical History:   Past Medical History:  Diagnosis Date   C. difficile colitis    Cancer ([place])    Endometrial cancer   Essential and other specified forms of tremor    Osteoporosis 11/2019   T score -2.8 stable from prior DEXA   Personal history of venous thrombosis and embolism 1997  Pulmonary embolus (HCC)    Shingles    Stroke ([place])    Thrush    TIA (transient ischemic attack)       Past Surgical History:  Procedure Laterality Date   APPENDECTOMY  1961   cataract surg Bilateral 2021   Dr. Gershon Crane, R eye 8/18, L 8/25   [place]   TONSILLECTOMY AND ADENOIDECTOMY     TOTAL ABDOMINAL HYSTERECTOMY W/ BILATERAL SALPINGOOPHORECTOMY  1997    Social History:  Ambulatory  independently      reports that she has never smoked. She has never used smokeless tobacco. She reports previous alcohol use. She reports that she does not use drugs.     Family  History:   Family History  Problem Relation Age of Onset   Cancer Mother        Unknown type   Dementia Mother    Cancer Father        Bone   Heart disease Brother    Prostate cancer Brother        Prostate   Heart disease Brother    Kidney failure Brother    Prostate cancer Brother        Prostate   Heart disease Sister    Blindness Sister 32       temporal arteritis   Skin cancer Sister        basal and melanoma   Heart disease Sister    Kidney failure Sister        Kidney transplant   Diabetes Sister    Hypertension Sister    Heart disease Brother    ______________________________________________________________________________________________ Allergies: Allergies  Allergen Reactions   Codeine Hives   Hctz [Hydrochlorothiazide] Other (See Comments)    Low sodium   Meloxicam Nausea Only    Gastritis and nausea   Mysoline [Primidone]    Pneumovax 23 [Pneumococcal Vac Polyvalent]     Red, raised area.  Knot at site of injection   Toradol [Ketorolac Tromethamine] Swelling    Localized redness at injection site.     Ace Inhibitors Cough     Prior to Admission medications   Medication Sig Start Date End Date Taking? Authorizing Provider  acetaminophen (TYLENOL) 500 MG tablet Take 500 mg by mouth every 6 (six) hours as needed for moderate pain.   Yes [provider]  aspirin EC 81 MG tablet Take 1 tablet (81 mg total) by mouth daily. Swallow whole. 04/17/21  Yes Fay Records, MD  atorvastatin (LIPITOR) 10 MG tablet Take 1 tablet Daily for Cholesterol Patient taking differently: Take 10 mg by mouth daily. Take 1 tablet Daily for Cholesterol 05/28/21  Yes Unk Pinto, MD  Calcium-Magnesium-Vitamin D 300-20-200 MG-MG-UNIT CHEW Chew 1 tablet daily by mouth.    Yes [provider]  Cholecalciferol (VITAMIN D3) 5000 units TABS Take 2 tablets by mouth daily.    Yes [provider]  clonazePAM (KLONOPIN) 0.5 MG tablet TAKE 1 TABLET BY MOUTH  EVERY MORNING AND 1/2 TABLET AT NOON AND 1/2 TABLET AT NIGHT Patient taking differently: Take 0.25-0.5 mg by mouth See admin instructions. TAKE 1 TABLET BY MOUTH EVERY MORNING AND 1/2 TABLET AT NOON AND 1/2 TABLET AT NIGHT 06/13/21  Yes [name], NP  hydroxypropyl methylcellulose / hypromellose (ISOPTO TEARS / GONIOVISC) 2.5 % ophthalmic solution Place into both eyes daily as needed for dry eyes.   Yes [provider]  Magnesium 250 MG TABS Take 250 mg by mouth 2 (two)  times a day.   Yes [provider]  olmesartan (BENICAR) 5 MG tablet Take 5 mg by mouth daily as needed (if blood pressure is too high). For blood pressure only if 150/90+   Yes [provider]  propranolol (INDERAL) 40 MG tablet TAKE 1 TABLET BY MOUTH FOUR TIMES DAILY WITH MEALS AND BEDTIME FOR BLOOD PRESSURE AND TREMOR Patient taking differently: Take 40 mg by mouth 4 (four) times daily. 02/15/21  Yes [name], NP  topiramate (TOPAMAX) 25 MG tablet TAKE 1 TABLET(25 MG) BY MOUTH AT BEDTIME Patient taking differently: Take 25 mg by mouth daily. 03/12/21  Yes [name], NP  vitamin B-12 (CYANOCOBALAMIN) 500 MCG tablet Take 500 mcg by mouth daily. Sublingual   Yes [provider]    ___________________________________________________________________________________________________ Physical Exam: GBTDVV with BMI 06/14/2021 06/14/2021 06/14/2021  Height - - -  Weight - - -  BMI - - -  Systolic 616 073 710  Diastolic 97 69 93  Pulse 74 73 74     1. General:  in No  Acute distress   Chronically ill   -appearing 2. Psychological: Alert and  Oriented 3. Head/ENT:   Dry Mucous Membranes                          Head Non traumatic, neck supple                           Poor Dentition 4. SKIN:  decreased Skin turgor,  Skin clean Dry and intact no rash 5. Heart: Regular rate and rhythm no Murmur, no Rub or gallop 6. Lungs:   no wheezes or crackles   7. Abdomen: Soft, non-tender, Non  distended bowel sounds present 8. Lower extremities: no clubbing, cyanosis, no  edema 9. Neurologically Grossly intact, moving all 4 extremities equally  10. MSK: Normal range of motion    Chart has been reviewed  ______________________________________________________________________________________________  Assessment/Plan 87 y.o. female with medical history significant of hx of PE 49 y ago no longer on anticoagulation, endometrial ca sp ressection, HTN, CKD 3b, CVA, HLD, pre diabeites, vertigo  RBBB, PSVT   Admitted for PE  Present on Admission:  Pulmonary embolism ([place]) -  Admit to  telemetry   Initiate heparin drip  Would likely benefit from case manager consult for long term anticoagulation Hold home blood pressure medications avoid hypotension Cycle cardiac enzymes Order echogram and lower extremity Dopplers  Most likely risk factors for hypercoagulable state being  obesity   sedentary lifestyle       Paroxysmal supraventricular tachycardia (HCC) chronic stable continue to monitor and   Hyperlipidemia, mixed -continue home medication   Essential tremor -stable resume Inderal when able   Essential hypertension -allow permissive hypertension for tonight   Complete right bundle branch block -chronic stable   Chronic kidney disease, stage III (moderate) (HCC) -  -chronic avoid nephrotoxic medications such as NSAIDs, Vanco Zosyn combo,  avoid hypotension, continue to follow renal function  Hx of TIA - cont Asa 81mg   Other plan as per orders.  DVT prophylaxis: heparin       Code Status:    Code Status: Prior FULL CODE as per patient   I had personally discussed CODE STATUS with patient      Family Communication:   Family not at  Bedside     Disposition Plan:     To home once workup is  complete and patient is stable   Following barriers for discharge:                            Electrolytes corrected  Heparin for 24 h  EXPECT DC tomorrow                       Consults called: none  Admission status:  ED Disposition     ED Disposition  Admit   Condition  --   [place]: [place] [[id number]]  Level of Care: Telemetry [5]  Admit to tele based on following criteria: Other see comments  Comments: pul emb  May place patient in observation at [facility] or [facility] if equivalent level of care is available:: No  Covid Evaluation: Asymptomatic Screening Protocol (No Symptoms)  Diagnosis: Pulmonary embolism ([place]) [[id number]]  Admitting Physician: Toy Baker [[id number]]  Attending Physician: Toy Baker [[id number]]           Obs    Level of care    tele  For   24H     No results found for: SARSCOV2NAA   Precautions: admitted as  asymptomatic screening protocol    PPE: Used by the provider:   N95  eye Goggles,  Gloves     [place] 06/14/2021, 7:50 PM    [facility]     after 2 AM please page floor coverage PA If 7AM-7PM, please contact the day team taking care of the patient using [email or web address]   Patient was evaluated in the context of the global COVID-19 pandemic, which necessitated consideration that the patient might be at risk for infection with the SARS-CoV-2 virus that causes COVID-19. Institutional protocols and algorithms that pertain to the evaluation of patients at risk for COVID-19 are in a state of rapid change based on information released by regulatory bodies including the CDC and federal and state organizations. These policies and algorithms were followed during the patient's care.

## 2021-06-14 NOTE — ED Provider Notes (Signed)
Emergency Medicine Provider Triage Evaluation Note  Monica Curry , a 87 y.o. female  was evaluated in triage.  Pt complains of sob.  Review of Systems  Positive: Upper back pain, sob Negative: Fever, chills, cough, hemoptysis  Physical Exam  BP (!) 154/75 (BP Location: Left Arm)   Pulse 79   Temp 98 F (36.7 C) (Oral)   Resp 16   Ht 5\' 4"  (1.626 m)   Wt 67.1 kg   BMI 25.40 kg/m  Gen:   Awake, no distress   Resp:  Normal effort  MSK:   Moves extremities without difficulty  Other:    Medical Decision Making  Medically screening exam initiated at 3:58 PM.  Appropriate orders placed.  Jaiyla Hammar was informed that the remainder of the evaluation will be completed by another provider, this initial triage assessment does not replace that evaluation, and the importance of remaining in the ED until their evaluation is complete.  Patient with remote history of PE who is here with upper back pain shortness of breath for about 2 days.  Was seen for complaint yesterday by PCP and had a normal chest x-ray and blood work but was told to return to rule out PE if symptoms persist.  She still endorse some pain to her upper back.   Domenic Moras, PA-C 06/14/21 1606    Valarie Merino, MD 06/15/21 [phone]

## 2021-06-14 NOTE — ED Triage Notes (Signed)
Patient c/o SOB, left chest pain and left upper back pain. Patient states she "felt every breath in her chest as pain." Patient had a CXR yesterday. Patient states that she was informed that her CXR was normal and lab work was normal. Patient states she was told if she continued to have symptoms to come to the ED to r/a PE.   Patient states left CP and SOB are worse when she lays on her side. Patient states she had a history of PE many years ago.

## 2021-06-14 NOTE — Progress Notes (Signed)
ANTICOAGULATION CONSULT NOTE - Initial Consult  Pharmacy Consult for Heparin Indication: pulmonary embolus  Allergies  Allergen Reactions   Codeine Hives   Hctz [Hydrochlorothiazide] Other (See Comments)    Low sodium   Meloxicam Nausea Only    Gastritis and nausea   Mysoline [Primidone]    Pneumovax 23 [Pneumococcal Vac Polyvalent]     Red, raised area.  Knot at site of injection   Toradol [Ketorolac Tromethamine] Swelling    Localized redness at injection site.     Ace Inhibitors Cough    Patient Measurements: Height: 5\' 4"  (162.6 cm) Weight: 67.1 kg (148 lb) IBW/kg (Calculated) : 54.7 Heparin Dosing Weight: 67.1 kg  Vital Signs: Temp: 98 F (36.7 C) (07/07 1540) Temp Source: Oral (07/07 1540) BP: 184/97 (07/07 1830) Pulse Rate: 74 (07/07 1830)  Labs: Recent Labs    06/13/21 1529 06/14/21 1607  HGB 15.1 14.7  HCT 46.8* 45.8  PLT 330 320  CREATININE 1.25* 1.17*    Estimated Creatinine Clearance: 31.9 mL/min (A) (by C-G formula based on SCr of 1.17 mg/dL (H)).   Medical History: Past Medical History:  Diagnosis Date   C. difficile colitis    Cancer [facility])    Endometrial cancer   Essential and other specified forms of tremor    Osteoporosis 11/2019   T score -2.8 stable from prior DEXA   Personal history of venous thrombosis and embolism 1997   Pulmonary embolus ([facility])    Shingles    Stroke ([place])    Thrush    TIA (transient ischemic attack)     Medications:  No PTA anticoagulation  Assessment: Pharmacy consulted to dose heparin for PE.  No PTA anticoagulation listed.  Hemoglobin and platelets are WNL.   Goal of Therapy:  Heparin level 0.3-0.7 units/ml Monitor platelets by anticoagulation protocol: Yes   Plan:  Heparin 2000 units IV bolus x 1 Heparin 950 units/hr Obtain 8 hour heparin level Monitor daily heparin level, CBC, s/s bleeding   Kacy Hegna P. Legrand Como, PharmD, [place] Please utilize Amion for  appropriate phone number to reach the unit pharmacist ([place]) 06/14/2021 7:00 PM

## 2021-06-14 NOTE — ED Notes (Signed)
Pt connected to pure-wick per Dr.Doutova's verbal order.

## 2021-06-15 ENCOUNTER — Other Ambulatory Visit (HOSPITAL_COMMUNITY): Payer: Self-pay

## 2021-06-15 ENCOUNTER — Observation Stay (HOSPITAL_BASED_OUTPATIENT_CLINIC_OR_DEPARTMENT_OTHER): Payer: Medicare PPO

## 2021-06-15 ENCOUNTER — Encounter: Payer: Self-pay | Admitting: Adult Health

## 2021-06-15 DIAGNOSIS — I2602 Saddle embolus of pulmonary artery with acute cor pulmonale: Secondary | ICD-10-CM

## 2021-06-15 DIAGNOSIS — I700 Atherosclerosis of aorta: Secondary | ICD-10-CM | POA: Insufficient documentation

## 2021-06-15 DIAGNOSIS — I2699 Other pulmonary embolism without acute cor pulmonale: Secondary | ICD-10-CM | POA: Diagnosis not present

## 2021-06-15 LAB — CBC WITH DIFFERENTIAL/PLATELET
Abs Immature Granulocytes: 0.03 10*3/uL (ref 0.00–0.07)
Basophils Absolute: 0.1 10*3/uL (ref 0.0–0.1)
Basophils Relative: 1 %
Eosinophils Absolute: 0.1 10*3/uL (ref 0.0–0.5)
Eosinophils Relative: 2 %
HCT: 44.8 % (ref 36.0–46.0)
Hemoglobin: 14.2 g/dL (ref 12.0–15.0)
Immature Granulocytes: 0 %
Lymphocytes Relative: 26 %
Lymphs Abs: 1.8 10*3/uL (ref 0.7–4.0)
MCH: 28.4 pg (ref 26.0–34.0)
MCHC: 31.7 g/dL (ref 30.0–36.0)
MCV: 89.6 fL (ref 80.0–100.0)
Monocytes Absolute: 0.8 10*3/uL (ref 0.1–1.0)
Monocytes Relative: 11 %
Neutro Abs: 4.1 10*3/uL (ref 1.7–7.7)
Neutrophils Relative %: 60 %
Platelets: 286 10*3/uL (ref 150–400)
RBC: 5 MIL/uL (ref 3.87–5.11)
RDW: 14.4 % (ref 11.5–15.5)
WBC: 6.8 10*3/uL (ref 4.0–10.5)
nRBC: 0 % (ref 0.0–0.2)

## 2021-06-15 LAB — COMPREHENSIVE METABOLIC PANEL
ALT: 33 U/L (ref 0–44)
AST: 35 U/L (ref 15–41)
Albumin: 3.6 g/dL (ref 3.5–5.0)
Alkaline Phosphatase: 41 U/L (ref 38–126)
Anion gap: 7 (ref 5–15)
BUN: 16 mg/dL (ref 8–23)
CO2: 23 mmol/L (ref 22–32)
Calcium: 9.2 mg/dL (ref 8.9–10.3)
Chloride: 108 mmol/L (ref 98–111)
Creatinine, Ser: 1.02 mg/dL — ABNORMAL HIGH (ref 0.44–1.00)
GFR, Estimated: 53 mL/min — ABNORMAL LOW (ref >60)
Glucose, Bld: 116 mg/dL — ABNORMAL HIGH (ref 70–99)
Potassium: 4.4 mmol/L (ref 3.5–5.1)
Sodium: 138 mmol/L (ref 135–145)
Total Bilirubin: 0.9 mg/dL (ref 0.3–1.2)
Total Protein: 6.5 g/dL (ref 6.5–8.1)

## 2021-06-15 LAB — ECHOCARDIOGRAM COMPLETE
Area-P 1/2: 3.65 cm2
Height: 64 in
S' Lateral: 2.3 cm
Weight: 2367.99 oz

## 2021-06-15 LAB — SARS CORONAVIRUS 2 (TAT 6-24 HRS): SARS Coronavirus 2: NEGATIVE

## 2021-06-15 LAB — HEPARIN LEVEL (UNFRACTIONATED)
Heparin Unfractionated: 0.26 IU/mL — ABNORMAL LOW (ref 0.30–0.70)
Heparin Unfractionated: 0.43 IU/mL (ref 0.30–0.70)
Heparin Unfractionated: 0.57 IU/mL (ref 0.30–0.70)

## 2021-06-15 LAB — PHOSPHORUS: Phosphorus: 3.5 mg/dL (ref 2.5–4.6)

## 2021-06-15 LAB — MAGNESIUM: Magnesium: 2 mg/dL (ref 1.7–2.4)

## 2021-06-15 LAB — TSH: TSH: 1.38 u[IU]/mL (ref 0.350–4.500)

## 2021-06-15 MED ORDER — CLONAZEPAM 0.5 MG PO TABS
0.5000 mg | ORAL_TABLET | Freq: Two times a day (BID) | ORAL | Status: DC | PRN
  Administered 2021-06-15: 0.5 mg via ORAL
  Filled 2021-06-15: qty 1

## 2021-06-15 NOTE — Plan of Care (Signed)

## 2021-06-15 NOTE — Progress Notes (Addendum)
ANTICOAGULATION CONSULT NOTE - Follow Up Consult  Pharmacy Consult for Heparin Indication: pulmonary embolus  Allergies  Allergen Reactions   Codeine Hives   Hctz [Hydrochlorothiazide] Other (See Comments)    Low sodium   Meloxicam Nausea Only    Gastritis and nausea   Mysoline [Primidone]    Pneumovax 23 [Pneumococcal Vac Polyvalent]     Red, raised area.  Knot at site of injection   Toradol [Ketorolac Tromethamine] Swelling    Localized redness at injection site.     Ace Inhibitors Cough    Patient Measurements: Height: 5\' 4"  (162.6 cm) Weight: 67.1 kg (148 lb) IBW/kg (Calculated) : 54.7 Heparin Dosing Weight: TBW  Vital Signs: Temp: 98.1 F (36.7 C) (07/08 0902) Temp Source: Oral (07/08 0902) BP: 144/73 (07/08 0902) Pulse Rate: 79 (07/08 0902)  Labs: Recent Labs    06/13/21 1529 06/14/21 1607 06/15/21 0402  HGB 15.1 14.7 14.2  HCT 46.8* 45.8 44.8  PLT 330 320 286  APTT  --  29  --   LABPROT  --  15.2  --   INR  --  1.2  --   HEPARINUNFRC  --   --  0.26*  CREATININE 1.25* 1.17* 1.02*  TROPONINIHS  --  4  --     Estimated Creatinine Clearance: 36.6 mL/min (A) (by C-G formula based on SCr of 1.02 mg/dL (H)).   Medications:  Infusions:   heparin 1,100 Units/hr (06/15/21 0447)    Assessment: 48 yoF admitted on 7/7 with new PE.  No current prior to admission anticoagulants noted.  She has a history of PE/DVT in 1997 for which she was on warfarin for many years.  Baseline coags WNL.  Pharmacy was consulted to dose Heparin IV.  Today, 06/15/2021: Heparin level 0.43, therapeutic on heparin 1100 units/hr CBC: Hgb remains stable, WNL.  Plt WNL. No bleeding or complications reported.   Goal of Therapy:  Heparin level 0.3-0.7 units/ml Monitor platelets by anticoagulation protocol: Yes   Plan:  Continue heparin IV infusion at 1100 units/hr Heparin level in 8 hours to confirm therapeutic rate Daily heparin level and CBC Follow up plans for long term oral  anticoagulation.     Gretta Arab PharmD, BCPS Clinical Pharmacist [facility] main pharmacy [phone] 06/15/2021 1:14 PM

## 2021-06-15 NOTE — Care Management Obs Status (Signed)
[place] NOTIFICATION   Patient Details  Name: Monica Curry MRN: [id number] Date of Birth: 1931/02/19   Medicare Observation Status Notification Given:  Yes    Cecil Cobbs 06/15/2021, 3:25 PM

## 2021-06-15 NOTE — Assessment & Plan Note (Addendum)
-   On propranolol at home as well as clonazepam -Some concern over ongoing chronic benzodiazepine use especially with her described "narcolepsy" events and work-up that is being pursued due to this while she continues to be on chronic benzo.  She reports that she has not been a candidate for other treatments or had treatment failure; long discussion held bedside that medications may need to be adjusted versus excepting some tremor symptoms versus pursuing other nonmedicinal treatments such as some possible surgery she was alluding to which she may or may not be a candidate for at this time -Encouraged her to discuss ongoing benzo use with neurology at follow-up

## 2021-06-15 NOTE — Progress Notes (Signed)
PT Cancellation Note  Patient Details Name: Jayleana Colberg MRN: [id number] DOB: 07-10-1931   Cancelled Treatment:    Reason Eval/Treat Not Completed: Medical issues which prohibited therapy Pt on IV heparin.  RN reports pt not to d/c today.  Will check back tomorrow for evaluation.   Liviah Cake,KATHrine E 06/15/2021, 2:50 PM Kati PT, DPT Acute Rehabilitation Services Pager: [phone] Office: [phone]

## 2021-06-15 NOTE — Assessment & Plan Note (Signed)
-  patient has history of CKD3b. Baseline creat ~ 1.2 - 1.3, eGFR 34-39 -Currently at baseline

## 2021-06-15 NOTE — Assessment & Plan Note (Addendum)
-   Further collateral information obtained bedside.  She endorses a mixed picture of mostly being sedentary watching TV throughout the day and sleeping at night along with napping during the day due to her "narcolepsy".  However after this discussion she then began stating that she drives some, does simple gardening, and maintains her house. -After conversation, still seems to be more so of a provoked clot given her immobility and decreased physical activity.  Due to this being second clot in lifetime, could still consider possible referral to hematology if further work-up felt to be indicated; first clot in life seems to have been centered around her endometrial cancer and hysterectomy -Heparin drip transitioned to Eliquis on 06/16/2021  -Echo negative for right heart strain.  Lower extremity duplex negative for DVT - She will need outpatient follow-up with pulmonology especially to discuss length of use of treatment -PT/OT consult: No home needs, stable for discharging home

## 2021-06-15 NOTE — Progress Notes (Signed)
BLE venous duplex has been completed.    Results can be found under chart review under CV PROC. 06/15/2021 1:26 PM Marlie Kuennen RVT, RDMS

## 2021-06-15 NOTE — Hospital Course (Addendum)
Monica Curry is an 87 yo female with PMH endometrial cancer s/p hysterectomy, hx PE (likely provoked from prior cancer and/or surgery), HTN, TIA, osteoporosis, SVT, CKD3, essential tremor who presented to the hospital with complaints of shortness of breath and some chest discomfort.  She has been seen previously outpatient for similar complaints.  On admission she underwent work-up with CTA chest which revealed bilateral PE of at least moderate sized clot burden. Lower extremity duplex was also obtained after this and was negative for DVT.  Echo obtained and did not show any significant right heart strain.  EF 65 to 70% with grade 1 diastolic dysfunction. She was started on a heparin drip and admitted for further monitoring.  See below for further problem-based plan.

## 2021-06-15 NOTE — Progress Notes (Signed)
Progress Note    Ronee Ranganathan   KZL:[id number]  DOB: [birth year]  DOA: 06/14/2021     0  PCP: Unk Pinto, MD  CC: SOB  Hospital Course: Monica Curry is an 87 yo female with PMH endometrial cancer s/p hysterectomy, hx PE (likely provoked from prior cancer and/or surgery), HTN, TIA, osteoporosis, SVT, CKD3, essential tremor who presented to the hospital with complaints of shortness of breath and some chest discomfort.  She has been seen previously outpatient for similar complaints.  On admission she underwent work-up with CTA chest which revealed bilateral PE of at least moderate sized clot burden. Lower extremity duplex was also obtained after this and was negative for DVT.  Echo obtained and did not show any significant right heart strain.  EF 65 to 70% with grade 1 diastolic dysfunction. She was started on a heparin drip and admitted for further monitoring.  Interval History:  Patient endorses minimal activity at home.  She is mostly watching TV in a chair during the day then sleeping at night.  Does not get much physical activity at all unfortunately.  We discussed this was likely culprit for her blood clot.  Also discussed further anticoagulation options and she was unable to decide.  She wishes to speak with pharmacy to help her further decide.  ROS: Constitutional: negative for chills and fevers, Respiratory: negative for cough, Cardiovascular: negative for chest pain, and Gastrointestinal: negative for abdominal pain  Assessment & Plan: * Pulmonary embolism (HCC) - Further collateral information obtained bedside.  She is predominantly chair bound during the day watching TV throughout the day and then going to bed at night.  She does not go on any walks or get any exercise.  She also endorses a questionable history of falling asleep while watching TV unintentionally and "not feeling tired".  However, she is also on 2-3 times daily clonazepam which may also be over sedating  her in addition to very low physical reserve -Clot is presumed provoked at this time given her extreme immobility - Continue heparin drip.  Patient undecided on oral anticoagulation after my discussion bedside, have asked pharmacy to talk further with her bedside -Echo negative for right heart strain.  Lower extremity duplex negative for DVT - She will need outpatient follow-up with pulmonology especially to discuss length of use of treatment -PT/OT consult   Anxiety - database reviewed. Clonaz 0.5 #60 for 30 day supply. This is likely excessive given her age and description of her "narcolepsy".  She is also following up with neurology for some further testing but she would benefit greatly from further weaning of clonazepam down to off over time  Paroxysmal supraventricular tachycardia ([place]) - Followed by cardiology.  Low suspicion for arrhythmia  History of TIA (transient ischemic attack) - On aspirin at home  Chronic kidney disease, stage 3b ([place]) - patient has history of CKD3b. Baseline creat ~ 1.2 - 1.3, eGFR 34-39 -Currently at baseline   Essential tremor - On propranolol at home  Hyperlipidemia, mixed - Continue statin  Essential hypertension - On propranolol at home    Old records reviewed in assessment of this patient  Antimicrobials:   DVT prophylaxis: Heparin drip   Code Status:   Code Status: Full Code Family Communication:   Disposition Plan: Status is: Observation  The patient remains OBS appropriate and will d/c before 2 midnights.  Dispo: The patient is from: Home              Anticipated d/c is  to: Home              Patient currently is not medically stable to d/c.   Difficult to place patient No  Risk of unplanned readmission score:     Objective: Blood pressure (!) 144/73, pulse 79, temperature 98.1 F (36.7 C), temperature source Oral, resp. rate 18, height _0  (1.626 m), weight 67.1 kg, SpO2 98 %.  Examination: General appearance:  alert, cooperative, and no distress Head: Normocephalic, without obvious abnormality, atraumatic Eyes:  EOMI Lungs: clear to auscultation bilaterally Heart: regular rate and rhythm and S1, S2 normal Abdomen: normal findings: bowel sounds normal and soft, non-tender Extremities:  no TTP. Mild R>L swelling in LE Skin: mobility and turgor normal Neurologic: Grossly normal  Consultants:    Procedures:    Data Reviewed: I have personally reviewed following labs and imaging studies Results for orders placed or performed during the hospital encounter of 06/14/21 (from the past 24 hour(s))  Basic metabolic panel     Status: Abnormal   Collection Time: 06/14/21  4:07 PM  Result Value Ref Range   Sodium 134 (L) 135 - 145 mmol/L   Potassium 4.2 3.5 - 5.1 mmol/L   Chloride 100 98 - 111 mmol/L   CO2 26 22 - 32 mmol/L   Glucose, Bld 119 (H) 70 - 99 mg/dL   BUN 19 8 - 23 mg/dL   Creatinine, Ser 1.17 (H) 0.44 - 1.00 mg/dL   Calcium 9.2 8.9 - 10.3 mg/dL   GFR, Estimated 45 (L) >60 mL/min   Anion gap 8 5 - 15  Brain natriuretic peptide     Status: Abnormal   Collection Time: 06/14/21  4:07 PM  Result Value Ref Range   B Natriuretic Peptide 138.3 (H) 0.0 - 100.0 pg/mL  CBC with Differential     Status: Abnormal   Collection Time: 06/14/21  4:07 PM  Result Value Ref Range   WBC 7.2 4.0 - 10.5 K/uL   RBC 5.12 (H) 3.87 - 5.11 MIL/uL   Hemoglobin 14.7 12.0 - 15.0 g/dL   HCT 45.8 36.0 - 46.0 %   MCV 89.5 80.0 - 100.0 fL   MCH 28.7 26.0 - 34.0 pg   MCHC 32.1 30.0 - 36.0 g/dL   RDW 14.5 11.5 - 15.5 %   Platelets 320 150 - 400 K/uL   nRBC 0.0 0.0 - 0.2 %   Neutrophils Relative % 59 %   Neutro Abs 4.2 1.7 - 7.7 K/uL   Lymphocytes Relative 27 %   Lymphs Abs 2.0 0.7 - 4.0 K/uL   Monocytes Relative 12 %   Monocytes Absolute 0.9 0.1 - 1.0 K/uL   Eosinophils Relative 1 %   Eosinophils Absolute 0.1 0.0 - 0.5 K/uL   Basophils Relative 1 %   Basophils Absolute 0.1 0.0 - 0.1 K/uL   Immature  Granulocytes 0 %   Abs Immature Granulocytes 0.02 0.00 - 0.07 K/uL  APTT     Status: None   Collection Time: 06/14/21  4:07 PM  Result Value Ref Range   aPTT 29 24 - 36 seconds  Protime-INR     Status: None   Collection Time: 06/14/21  4:07 PM  Result Value Ref Range   Prothrombin Time 15.2 11.4 - 15.2 seconds   INR 1.2 0.8 - 1.2  Troponin I (High Sensitivity)     Status: None   Collection Time: 06/14/21  4:07 PM  Result Value Ref Range   Troponin I (High Sensitivity)  4 <18 ng/L  SARS CORONAVIRUS 2 (TAT 6-24 HRS) Nasopharyngeal Nasopharyngeal Swab     Status: None   Collection Time: 06/14/21  6:36 PM   Specimen: Nasopharyngeal Swab  Result Value Ref Range   SARS Coronavirus 2 NEGATIVE NEGATIVE  Heparin level (unfractionated)     Status: Abnormal   Collection Time: 06/15/21  4:02 AM  Result Value Ref Range   Heparin Unfractionated 0.26 (L) 0.30 - 0.70 IU/mL  Magnesium     Status: None   Collection Time: 06/15/21  4:02 AM  Result Value Ref Range   Magnesium 2.0 1.7 - 2.4 mg/dL  Phosphorus     Status: None   Collection Time: 06/15/21  4:02 AM  Result Value Ref Range   Phosphorus 3.5 2.5 - 4.6 mg/dL  CBC WITH DIFFERENTIAL     Status: None   Collection Time: 06/15/21  4:02 AM  Result Value Ref Range   WBC 6.8 4.0 - 10.5 K/uL   RBC 5.00 3.87 - 5.11 MIL/uL   Hemoglobin 14.2 12.0 - 15.0 g/dL   HCT 44.8 36.0 - 46.0 %   MCV 89.6 80.0 - 100.0 fL   MCH 28.4 26.0 - 34.0 pg   MCHC 31.7 30.0 - 36.0 g/dL   RDW 14.4 11.5 - 15.5 %   Platelets 286 150 - 400 K/uL   nRBC 0.0 0.0 - 0.2 %   Neutrophils Relative % 60 %   Neutro Abs 4.1 1.7 - 7.7 K/uL   Lymphocytes Relative 26 %   Lymphs Abs 1.8 0.7 - 4.0 K/uL   Monocytes Relative 11 %   Monocytes Absolute 0.8 0.1 - 1.0 K/uL   Eosinophils Relative 2 %   Eosinophils Absolute 0.1 0.0 - 0.5 K/uL   Basophils Relative 1 %   Basophils Absolute 0.1 0.0 - 0.1 K/uL   Immature Granulocytes 0 %   Abs Immature Granulocytes 0.03 0.00 - 0.07 K/uL   TSH     Status: None   Collection Time: 06/15/21  4:02 AM  Result Value Ref Range   TSH 1.380 0.350 - 4.500 uIU/mL  Comprehensive metabolic panel     Status: Abnormal   Collection Time: 06/15/21  4:02 AM  Result Value Ref Range   Sodium 138 135 - 145 mmol/L   Potassium 4.4 3.5 - 5.1 mmol/L   Chloride 108 98 - 111 mmol/L   CO2 23 22 - 32 mmol/L   Glucose, Bld 116 (H) 70 - 99 mg/dL   BUN 16 8 - 23 mg/dL   Creatinine, Ser 1.02 (H) 0.44 - 1.00 mg/dL   Calcium 9.2 8.9 - 10.3 mg/dL   Total Protein 6.5 6.5 - 8.1 g/dL   Albumin 3.6 3.5 - 5.0 g/dL   AST 35 15 - 41 U/L   ALT 33 0 - 44 U/L   Alkaline Phosphatase 41 38 - 126 U/L   Total Bilirubin 0.9 0.3 - 1.2 mg/dL   GFR, Estimated 53 (L) >60 mL/min   Anion gap 7 5 - 15  Heparin level (unfractionated)     Status: None   Collection Time: 06/15/21 12:38 PM  Result Value Ref Range   Heparin Unfractionated 0.43 0.30 - 0.70 IU/mL    Recent Results (from the past 240 hour(s))  Urine Culture     Status: None   Collection Time: 06/13/21  3:29 PM   Specimen: Urine  Result Value Ref Range Status   MICRO NUMBER: [id number]  Final   SPECIMEN QUALITY: Adequate  Final  Sample Source URINE  Final   STATUS: FINAL  Final   ISOLATE 1:   Final    Mixed genital flora isolated. These superficial bacteria are not indicative of a urinary tract infection. No further organism identification is warranted on this specimen. If clinically indicated, recollect clean-catch, mid-stream urine and transfer  immediately to Urine Culture Transport Tube.   MICROSCOPIC MESSAGE     Status: None   Collection Time: 06/13/21  3:29 PM  Result Value Ref Range Status   Note   Final    Comment: This urine was analyzed for the presence of WBC,  RBC, bacteria, casts, and other formed elements.  Only those elements seen were reported. . .   SARS CORONAVIRUS 2 (TAT 6-24 HRS) Nasopharyngeal Nasopharyngeal Swab     Status: None   Collection Time: 06/14/21  6:36 PM    Specimen: Nasopharyngeal Swab  Result Value Ref Range Status   SARS Coronavirus 2 NEGATIVE NEGATIVE Final    Comment: (NOTE) SARS-CoV-2 target nucleic acids are NOT DETECTED.  The SARS-CoV-2 RNA is generally detectable in upper and lower respiratory specimens during the acute phase of infection. Negative results do not preclude SARS-CoV-2 infection, do not rule out co-infections with other pathogens, and should not be used as the sole basis for treatment or other patient management decisions. Negative results must be combined with clinical observations, patient history, and epidemiological information. The expected result is Negative.  Fact Sheet for Patients: SugarRoll.be  Fact Sheet for Healthcare Providers: https://www.woods-mathews.com/  This test is not yet approved or cleared by the [place] FDA and  has been authorized for detection and/or diagnosis of SARS-CoV-2 by FDA under an Emergency Use Authorization (EUA). This EUA will remain  in effect (meaning this test can be used) for the duration of the COVID-19 declaration under Se ction 564(b)(1) of the Act, 21 U.S.C. section 360bbb-3(b)(1), unless the authorization is terminated or revoked sooner.  Performed at [facility], [address], [place], [place] [address]      Radiology Studies: DG Chest 2 View  Result Date: 06/14/2021 CLINICAL DATA:  Shortness of breath. EXAM: CHEST - 2 VIEW COMPARISON:  06/13/2021 FINDINGS: The cardiac silhouette, mediastinal and hilar contours are within normal limits is stable. Chronic left basilar pleural thickening and parenchymal scarring. No acute pulmonary findings. No worrisome pulmonary lesions. The bony thorax is intact. IMPRESSION: Chronic left basilar pleural thickening and parenchymal scarring. No acute pulmonary findings. Electronically Signed   By: Marijo Sanes M.D.   On: 06/14/2021 16:14   DG Chest 2 View  Result Date:  06/14/2021 CLINICAL DATA:  Chest pain. EXAM: CHEST - 2 VIEW COMPARISON:  February 08, 2020. FINDINGS: The heart size and mediastinal contours are within normal limits. Minimal bibasilar subsegmental atelectasis is noted. The visualized skeletal structures are unremarkable. IMPRESSION: Minimal bibasilar subsegmental atelectasis. Electronically Signed   By: Marijo Conception M.D.   On: 06/14/2021 10:32   CT Angio Chest PE W and/or Wo Contrast  Result Date: 06/14/2021 CLINICAL DATA:  Shortness of breath and left-sided chest pain. EXAM: CT ANGIOGRAPHY CHEST WITH CONTRAST TECHNIQUE: Multidetector CT imaging of the chest was performed using the standard protocol during bolus administration of intravenous contrast. Multiplanar CT image reconstructions and MIPs were obtained to evaluate the vascular anatomy. CONTRAST:  31mL OMNIPAQUE IOHEXOL 350 MG/ML SOLN COMPARISON:  Radiograph earlier today. FINDINGS: Cardiovascular: Examination is positive for acute pulmonary embolus. There are filling defects within the right and left pulmonary arterial  system. On the right this involves the distal main pulmonary artery extending into all lobar and many segmental branches. On the left this involves the left lower lobe are branches, near occlusive, extending into the segmental and subsegmental branches. There is subsegmental left upper lobe involvement. Thromboembolic burden is moderate. There is right heart strain with RV to LV ratio of 1.2. Upper normal heart size. No pericardial effusion. Moderate aortic atherosclerosis with tortuosity. Conventional branching pattern from the aortic arch. No aortic dissection. Mediastinum/Nodes: No enlarged mediastinal or hilar lymph nodes. No esophageal wall thickening. Heterogeneously enlarged right lobe of the thyroid gland without definite dominant nodule. Lungs/Pleura: Small left greater than right pleural effusion with associated atelectasis. No evidence of pulmonary infarct. No pulmonary edema. No  pulmonary mass. Motion artifact limits detailed assessment of the basis. Upper Abdomen: Motion obscured, bilateral renal cysts. Musculoskeletal: Exaggerated thoracic kyphosis with multilevel degenerative change. There are no acute or suspicious osseous abnormalities. Unremarkable chest wall soft tissues. Review of the MIP images confirms the above findings. IMPRESSION: 1. Examination is positive for acute pulmonary embolus with moderate clot burden and evidence of right heart strain, RV to LV ratio of 1.2. 2. Small left greater than right pleural effusions with associated atelectasis. Aortic Atherosclerosis (ICD10-I70.0). Critical Value/emergent results were called by telephone at the time of interpretation on 06/14/2021 at 5:54 pm to Dr Carmin Muskrat , who verbally acknowledged these results. Electronically Signed   By: Keith Rake M.D.   On: 06/14/2021 17:55   ECHOCARDIOGRAM COMPLETE  Result Date: 06/15/2021    ECHOCARDIOGRAM REPORT   Patient Name:   TYSHAY ADEE Date of Exam: 06/15/2021 Medical Rec #:  [id number]       Height:       64.0 in Accession #:    [id number]      Weight:       148.0 lb Date of Birth:  08-30-1931       BSA:          1.721 m Patient Age:    37 years        BP:           137/70 mmHg Patient Gender: F               HR:           78 bpm. Exam Location:  Inpatient Procedure: 2D Echo, Cardiac Doppler and Color Doppler Indications:    I26.02 Pulmonary embolus  History:        Patient has prior history of Echocardiogram examinations, most                 recent 10/24/2014. Stroke and TIA. Cancer.  Sonographer:    Jonelle Sidle Dance Referring Phys: [id number]  1. Left ventricular ejection fraction, by estimation, is 65 to 70%. The left ventricle has normal function. The left ventricle has no regional wall motion abnormalities. There is severe asymmetric left ventricular hypertrophy of the basal-septal segment. Left ventricular diastolic parameters are consistent with  Grade I diastolic dysfunction (impaired relaxation).  2. Right ventricular systolic function is normal. The right ventricular size is normal. Tricuspid regurgitation signal is inadequate for assessing PA pressure.  3. The mitral valve is normal in structure. No evidence of mitral valve regurgitation. No evidence of mitral stenosis.  4. The aortic valve was not well visualized. Aortic valve regurgitation is not visualized. No aortic stenosis is present.  5. The inferior vena cava is normal in size with greater than  50% respiratory variability, suggesting right atrial pressure of 3 mmHg. FINDINGS  Left Ventricle: Left ventricular ejection fraction, by estimation, is 65 to 70%. The left ventricle has normal function. The left ventricle has no regional wall motion abnormalities. The left ventricular internal cavity size was small. There is severe asymmetric left ventricular hypertrophy of the basal-septal segment. Left ventricular diastolic parameters are consistent with Grade I diastolic dysfunction (impaired relaxation). Right Ventricle: The right ventricular size is normal. No increase in right ventricular wall thickness. Right ventricular systolic function is normal. Tricuspid regurgitation signal is inadequate for assessing PA pressure. Left Atrium: Left atrial size was normal in size. Right Atrium: Right atrial size was normal in size. Pericardium: Trivial pericardial effusion is present. Mitral Valve: The mitral valve is normal in structure. No evidence of mitral valve regurgitation. No evidence of mitral valve stenosis. Tricuspid Valve: The tricuspid valve is normal in structure. Tricuspid valve regurgitation is trivial. Aortic Valve: The aortic valve was not well visualized. Aortic valve regurgitation is not visualized. No aortic stenosis is present. Pulmonic Valve: The pulmonic valve was not well visualized. Pulmonic valve regurgitation is not visualized. Aorta: The aortic root and ascending aorta are  structurally normal, with no evidence of dilitation. Venous: The inferior vena cava is normal in size with greater than 50% respiratory variability, suggesting right atrial pressure of 3 mmHg. IAS/Shunts: No atrial level shunt detected by color flow Doppler.  LEFT VENTRICLE PLAX 2D LVIDd:         3.00 cm  Diastology LVIDs:         2.30 cm  LV e' medial:    6.31 cm/s LV PW:         1.10 cm  LV E/e' medial:  11.1 LV IVS:        1.20 cm  LV e' lateral:   6.85 cm/s LVOT diam:     1.70 cm  LV E/e' lateral: 10.2 LV SV:         38 LV SV Index:   22 LVOT Area:     2.27 cm  RIGHT VENTRICLE             IVC RV Basal diam:  2.80 cm     IVC diam: 1.55 cm RV S prime:     12.70 cm/s TAPSE (M-mode): 1.7 cm LEFT ATRIUM             Index       RIGHT ATRIUM           Index LA diam:        4.10 cm 2.38 cm/m  RA Area:     10.00 cm LA Vol (A2C):   47.1 ml 27.36 ml/m RA Volume:   21.00 ml  12.20 ml/m LA Vol (A4C):   51.1 ml 29.69 ml/m LA Biplane Vol: 50.2 ml 29.16 ml/m  AORTIC VALVE LVOT Vmax:   87.30 cm/s LVOT Vmean:  56.900 cm/s LVOT VTI:    0.169 m  AORTA Ao Root diam: 3.00 cm Ao Asc diam:  2.90 cm MITRAL VALVE MV Area (PHT): 3.65 cm     SHUNTS MV Decel Time: 208 msec     Systemic VTI:  0.17 m MV E velocity: 69.80 cm/s   Systemic Diam: 1.70 cm MV A velocity: 110.00 cm/s MV E/A ratio:  0.63 Oswaldo Milian MD Electronically signed by Oswaldo Milian MD Signature Date/Time: 06/15/2021/11:29:15 AM    Final    VAS [place] LOWER EXTREMITY VENOUS (DVT)  Result Date: 06/15/2021  Lower Venous DVT Study Patient Name:  PRISCILA BEAN  Date of Exam:   06/15/2021 Medical Rec #: [id number]        Accession #:    [id number] Date of Birth: [birth year]        Patient Gender: F Patient Age:   89Y Exam Location:  [facility] Procedure:      VAS [place] LOWER EXTREMITY VENOUS (DVT) Referring Phys: [id number] ANASTASSIA DOUTOVA --------------------------------------------------------------------------------  Indications: Pulmonary embolism.  Risk  Factors: PT stated HX of PE post hysterectomy - not in EMR. Comparison Study: Previous exam 04/21/18 - negative Performing Technologist: Rogelia Rohrer RVT, RDMS  Examination Guidelines: A complete evaluation includes B-mode imaging, spectral Doppler, color Doppler, and power Doppler as needed of all accessible portions of each vessel. Bilateral testing is considered an integral part of a complete examination. Limited examinations for reoccurring indications may be performed as noted. The reflux portion of the exam is performed with the patient in reverse Trendelenburg.  +---------+---------------+---------+-----------+----------+--------------+ RIGHT    CompressibilityPhasicitySpontaneityPropertiesThrombus Aging +---------+---------------+---------+-----------+----------+--------------+ CFV      Full           Yes      Yes                                 +---------+---------------+---------+-----------+----------+--------------+ SFJ      Full                                                        +---------+---------------+---------+-----------+----------+--------------+ FV Prox  Full           Yes      Yes                                 +---------+---------------+---------+-----------+----------+--------------+ FV Mid   Full           Yes      Yes                                 +---------+---------------+---------+-----------+----------+--------------+ FV DistalFull           Yes      Yes                                 +---------+---------------+---------+-----------+----------+--------------+ PFV      Full                                                        +---------+---------------+---------+-----------+----------+--------------+ POP      Full           Yes      Yes                                 +---------+---------------+---------+-----------+----------+--------------+ PTV      Full                                                         +---------+---------------+---------+-----------+----------+--------------+  PERO     Full                                                        +---------+---------------+---------+-----------+----------+--------------+   +---------+---------------+---------+-----------+----------+--------------+ LEFT     CompressibilityPhasicitySpontaneityPropertiesThrombus Aging +---------+---------------+---------+-----------+----------+--------------+ CFV      Full           Yes      Yes                                 +---------+---------------+---------+-----------+----------+--------------+ SFJ      Full                                                        +---------+---------------+---------+-----------+----------+--------------+ FV Prox  Full           Yes      Yes                                 +---------+---------------+---------+-----------+----------+--------------+ FV Mid   Full           Yes      Yes                                 +---------+---------------+---------+-----------+----------+--------------+ FV DistalFull           Yes      Yes                                 +---------+---------------+---------+-----------+----------+--------------+ PFV      Full                                                        +---------+---------------+---------+-----------+----------+--------------+ POP      Full           Yes      Yes                                 +---------+---------------+---------+-----------+----------+--------------+ PTV      Full                                                        +---------+---------------+---------+-----------+----------+--------------+ PERO     Full                                                        +---------+---------------+---------+-----------+----------+--------------+  Summary: BILATERAL: - No evidence of deep vein thrombosis seen in the lower extremities, bilaterally. - No evidence of  superficial venous thrombosis in the lower extremities, bilaterally. -No evidence of popliteal cyst, bilaterally.   *See table(s) above for measurements and observations.    Preliminary    VAS [place] LOWER EXTREMITY VENOUS (DVT)      CT Angio Chest PE W and/or Wo Contrast  Final Result    DG Chest 2 View  Final Result      Scheduled Meds:  aspirin EC  81 mg Oral Daily   atorvastatin  10 mg Oral Daily   propranolol  40 mg Oral QID   topiramate  25 mg Oral Daily   PRN Meds: acetaminophen **OR** acetaminophen, clonazePAM **AND** [DISCONTINUED] clonazepam **AND** [DISCONTINUED] clonazepam, polyvinyl alcohol Continuous Infusions:  heparin 1,100 Units/hr (06/15/21 0447)     LOS: 0 days  Time spent: Greater than 50% of the 35 minute visit was spent in counseling/coordination of care for the patient as laid out in the A&P.   Dwyane Dee, MD [facility] 06/15/2021, 1:51 PM

## 2021-06-15 NOTE — Assessment & Plan Note (Signed)
-   On propranolol at home

## 2021-06-15 NOTE — Assessment & Plan Note (Signed)
-   Followed by cardiology.  Low suspicion for arrhythmia

## 2021-06-15 NOTE — Assessment & Plan Note (Deleted)
-   database reviewed. Clonaz 0.5 #60 for 30 day supply. This is likely excessive given her age and description of her "narcolepsy".  She is also following up with neurology for some further testing but she would benefit greatly from further weaning of clonazepam down to off over time

## 2021-06-15 NOTE — Assessment & Plan Note (Signed)
Continue statin. 

## 2021-06-15 NOTE — Progress Notes (Signed)
ANTICOAGULATION CONSULT NOTE - Follow Up Consult  Pharmacy Consult for Heparin Indication: pulmonary embolus  Allergies  Allergen Reactions   Codeine Hives   Hctz [Hydrochlorothiazide] Other (See Comments)    Low sodium   Meloxicam Nausea Only    Gastritis and nausea   Mysoline [Primidone]    Pneumovax 23 [Pneumococcal Vac Polyvalent]     Red, raised area.  Knot at site of injection   Toradol [Ketorolac Tromethamine] Swelling    Localized redness at injection site.     Ace Inhibitors Cough    Patient Measurements: Height: 5\' 4"  (162.6 cm) Weight: 67.1 kg (148 lb) IBW/kg (Calculated) : 54.7 Heparin Dosing Weight: TBW  Vital Signs: Temp: 97.8 F (36.6 C) (07/08 2032) Temp Source: Oral (07/08 1453) BP: 153/75 (07/08 2032) Pulse Rate: 81 (07/08 2032)  Labs: Recent Labs    06/13/21 1529 06/14/21 1607 06/15/21 0402 06/15/21 1238 06/15/21 2018  HGB 15.1 14.7 14.2  --   --   HCT 46.8* 45.8 44.8  --   --   PLT 330 320 286  --   --   APTT  --  29  --   --   --   LABPROT  --  15.2  --   --   --   INR  --  1.2  --   --   --   HEPARINUNFRC  --   --  0.26* 0.43 0.57  CREATININE 1.25* 1.17* 1.02*  --   --   TROPONINIHS  --  4  --   --   --      Estimated Creatinine Clearance: 36.6 mL/min (A) (by C-G formula based on SCr of 1.02 mg/dL (H)).   Medications:  Infusions:   heparin 1,100 Units/hr (06/15/21 0447)    Assessment: 60 yoF admitted on 7/7 with new PE.  No current prior to admission anticoagulants noted.  She has a history of PE/DVT in 1997 for which she was on warfarin for many years.  Baseline coags WNL.  Pharmacy was consulted to dose Heparin IV.  Today, 06/15/2021: Heparin level remains therapeutic (0.57) on heparin 1100 units/hr CBC: Hgb remains stable, WNL.  Plt WNL. No bleeding or complications reported.   Goal of Therapy:  Heparin level 0.3-0.7 units/ml Monitor platelets by anticoagulation protocol: Yes   Plan:  Continue heparin IV infusion at 1100  units/hr Daily heparin level and CBC Follow up plans for long term oral anticoagulation, plan DOAC 7/9   Peggyann Juba, PharmD, BCPS Clinical Pharmacist [facility] main pharmacy [phone] 06/15/2021 9:04 PM

## 2021-06-15 NOTE — Progress Notes (Signed)
  Echocardiogram 2D Echocardiogram has been performed.  Gwynn Chalker G Raiyah Speakman 06/15/2021, 9:04 AM

## 2021-06-15 NOTE — Progress Notes (Signed)
ANTICOAGULATION CONSULT NOTE  Pharmacy Consult for Heparin Indication: pulmonary embolus  Allergies  Allergen Reactions   Codeine Hives   Hctz [Hydrochlorothiazide] Other (See Comments)    Low sodium   Meloxicam Nausea Only    Gastritis and nausea   Mysoline [Primidone]    Pneumovax 23 [Pneumococcal Vac Polyvalent]     Red, raised area.  Knot at site of injection   Toradol [Ketorolac Tromethamine] Swelling    Localized redness at injection site.     Ace Inhibitors Cough    Patient Measurements: Height: 5\' 4"  (162.6 cm) Weight: 67.1 kg (148 lb) IBW/kg (Calculated) : 54.7 Heparin Dosing Weight: 67.1 kg  Vital Signs: Temp: 98.1 F (36.7 C) (07/08 0424) BP: 137/70 (07/08 0424) Pulse Rate: 68 (07/08 0424)  Labs: Recent Labs    06/13/21 1529 06/14/21 1607 06/15/21 0402  HGB 15.1 14.7 14.2  HCT 46.8* 45.8 44.8  PLT 330 320 286  APTT  --  29  --   LABPROT  --  15.2  --   INR  --  1.2  --   HEPARINUNFRC  --   --  0.26*  CREATININE 1.25* 1.17*  --   TROPONINIHS  --  4  --      Estimated Creatinine Clearance: 31.9 mL/min (A) (by C-G formula based on SCr of 1.17 mg/dL (H)).   Medical History: Past Medical History:  Diagnosis Date   C. difficile colitis    Cancer [facility])    Endometrial cancer   Essential and other specified forms of tremor    Osteoporosis 11/2019   T score -2.8 stable from prior DEXA   Personal history of venous thrombosis and embolism 1997   Pulmonary embolus (HCC)    Shingles    Stroke ([place])    Thrush    TIA (transient ischemic attack)     Medications:  No PTA anticoagulation  Assessment: Pharmacy consulted to dose heparin for PE.  No PTA anticoagulation listed.  Hemoglobin and platelets are WNL.  06/15/2021: Initial heparin level 0.26- subtherapeutic on IV heparin 950 units/hr CBC: Hg/pltc stable/WNL No bleeding or infusion related concerns per RN  Goal of Therapy:  Heparin level 0.3-0.7 units/ml Monitor platelets by  anticoagulation protocol: Yes   Plan:  Increase heparin infusion to 1100 units/hr Obtain 8 hour heparin level after rate chanve Monitor daily heparin level, CBC, s/s bleeding  Netta Cedars, PharmD, BCPS 06/15/2021 4:37 AM

## 2021-06-15 NOTE — TOC Benefit Eligibility Note (Signed)
Patient Teacher, English as a foreign language completed.    The patient is currently admitted and upon discharge could be taking Xarelto 20 mg.  The current 30 day co-pay is, $40.00.   The patient is currently admitted and upon discharge could be taking Eliquis 5 mg.  The current 30 day co-pay is, $40.00.   The patient is insured through [place] [place] Patient Advocate Specialist [place] Team Direct Number: [phone]  Fax: [phone]

## 2021-06-15 NOTE — Assessment & Plan Note (Signed)
-   On aspirin at home

## 2021-06-15 NOTE — Progress Notes (Signed)
OT Cancellation Note  Patient Details Name: Monica Curry MRN: [id number] DOB: [birth year]   Cancelled Treatment:    Reason Eval/Treat Not Completed: Patient not medically ready. Per therapy protocol OT evaluation held until 24 hrs after initiation of Heparin. Initial dose of Heparin given at 2015. Will f/u tomorrow as able.  Sarin Comunale L Catarino Vold 06/15/2021, 2:23 PM

## 2021-06-16 DIAGNOSIS — I2699 Other pulmonary embolism without acute cor pulmonale: Secondary | ICD-10-CM | POA: Diagnosis not present

## 2021-06-16 LAB — CBC
HCT: 43.3 % (ref 36.0–46.0)
Hemoglobin: 13.8 g/dL (ref 12.0–15.0)
MCH: 28.3 pg (ref 26.0–34.0)
MCHC: 31.9 g/dL (ref 30.0–36.0)
MCV: 88.9 fL (ref 80.0–100.0)
Platelets: 316 10*3/uL (ref 150–400)
RBC: 4.87 MIL/uL (ref 3.87–5.11)
RDW: 14.5 % (ref 11.5–15.5)
WBC: 5.6 10*3/uL (ref 4.0–10.5)
nRBC: 0 % (ref 0.0–0.2)

## 2021-06-16 LAB — MAGNESIUM: Magnesium: 1.8 mg/dL (ref 1.7–2.4)

## 2021-06-16 LAB — BASIC METABOLIC PANEL
Anion gap: 8 (ref 5–15)
BUN: 26 mg/dL — ABNORMAL HIGH (ref 8–23)
CO2: 22 mmol/L (ref 22–32)
Calcium: 8.9 mg/dL (ref 8.9–10.3)
Chloride: 109 mmol/L (ref 98–111)
Creatinine, Ser: 1.36 mg/dL — ABNORMAL HIGH (ref 0.44–1.00)
GFR, Estimated: 38 mL/min — ABNORMAL LOW (ref >60)
Glucose, Bld: 105 mg/dL — ABNORMAL HIGH (ref 70–99)
Potassium: 4.1 mmol/L (ref 3.5–5.1)
Sodium: 139 mmol/L (ref 135–145)

## 2021-06-16 LAB — HEPARIN LEVEL (UNFRACTIONATED): Heparin Unfractionated: 0.57 IU/mL (ref 0.30–0.70)

## 2021-06-16 MED ORDER — APIXABAN 5 MG PO TABS: ORAL_TABLET | ORAL | 3 refills | Status: DC

## 2021-06-16 MED ORDER — APIXABAN 5 MG PO TABS: 5.0000 mg | ORAL_TABLET | Freq: Two times a day (BID) | ORAL | Status: DC

## 2021-06-16 MED ORDER — APIXABAN 5 MG PO TABS
10.0000 mg | ORAL_TABLET | Freq: Two times a day (BID) | ORAL | Status: DC
  Administered 2021-06-16: 10 mg via ORAL
  Filled 2021-06-16: qty 2

## 2021-06-16 NOTE — Plan of Care (Signed)

## 2021-06-16 NOTE — Discharge Summary (Signed)
Physician Discharge Summary   Monica Curry DOB: December 18, 1930 DOA: 06/14/2021  PCP: Unk Pinto, MD  Admit date: 06/14/2021 Discharge date:  06/16/2021  Admitted From: home Disposition:  home Discharging physician: Dwyane Dee, MD  Recommendations for Outpatient Follow-up:  Discuss Klonopin and Topamax use at neurology follow-up.  Patient may benefit from decreasing Klonopin dose and/or further weaning or alternative medication if able Needs referral to pulmonology due to large PE and discussion of treatment duration   Patient discharged to home in Discharge Condition: stable Risk of unplanned readmission score:    CODE STATUS: Full Diet recommendation:  Diet Orders (From admission, onward)     Start     Ordered   06/16/21 0000  Diet - low sodium heart healthy        06/16/21 1255   06/14/21 2013  Diet Heart Room service appropriate? Yes; Fluid consistency: Thin  Diet effective now       Question Answer Comment  Room service appropriate? Yes   Fluid consistency: Thin      06/14/21 2012            Hospital Course: Monica Curry is an 87 yo female with PMH endometrial cancer s/p hysterectomy, hx PE (likely provoked from prior cancer and/or surgery), HTN, TIA, osteoporosis, SVT, CKD3, essential tremor who presented to the hospital with complaints of shortness of breath and some chest discomfort.  She has been seen previously outpatient for similar complaints.  On admission she underwent work-up with CTA chest which revealed bilateral PE of at least moderate sized clot burden. Lower extremity duplex was also obtained after this and was negative for DVT.  Echo obtained and did not show any significant right heart strain.  EF 65 to 70% with grade 1 diastolic dysfunction. She was started on a heparin drip and admitted for further monitoring.  See below for further problem-based plan.  * Pulmonary embolism ([place]) - Further collateral information obtained  bedside.  She endorses a mixed picture of mostly being sedentary watching TV throughout the day and sleeping at night along with napping during the day due to her "narcolepsy".  However after this discussion she then began stating that she drives some, does simple gardening, and maintains her house. -After conversation, still seems to be more so of a provoked clot given her immobility and decreased physical activity.  Due to this being second clot in lifetime, could still consider possible referral to hematology if further work-up felt to be indicated; first clot in life seems to have been centered around her endometrial cancer and hysterectomy -Heparin drip transitioned to Eliquis on 06/16/2021  -Echo negative for right heart strain.  Lower extremity duplex negative for DVT - She will need outpatient follow-up with pulmonology especially to discuss length of use of treatment -PT/OT consult: No home needs, stable for discharging home  Essential tremor - On propranolol at home as well as clonazepam -Some concern over ongoing chronic benzodiazepine use especially with her described "narcolepsy" events and work-up that is being pursued due to this while she continues to be on chronic benzo.  She reports that she has not been a candidate for other treatments or had treatment failure; long discussion held bedside that medications may need to be adjusted versus excepting some tremor symptoms versus pursuing other nonmedicinal treatments such as some possible surgery she was alluding to which she may or may not be a candidate for at this time -Encouraged her to discuss ongoing benzo use with neurology at  follow-up  Paroxysmal supraventricular tachycardia ([place]) - Followed by cardiology.  Low suspicion for arrhythmia  History of TIA (transient ischemic attack) - On aspirin at home  Chronic kidney disease, stage 3b ([place]) - patient has history of CKD3b. Baseline creat ~ 1.2 - 1.3, eGFR 34-39 -Currently at  baseline   Hyperlipidemia, mixed - Continue statin  Essential hypertension - On propranolol at home    The patient's chronic medical conditions were treated accordingly per the patient's home medication regimen except as noted.  On day of discharge, patient was felt deemed stable for discharge. Patient/family member advised to call PCP or come back to ER if needed.   Principal Diagnosis: Pulmonary embolism [facility])  Discharge Diagnoses: Active Hospital Problems   Diagnosis Date Noted   Pulmonary embolism ([place]) 06/14/2021    Priority: High   Essential tremor 11/25/2013    Priority: Medium   Paroxysmal supraventricular tachycardia ([place]) 10/20/2019   Complete right bundle branch block 06/16/2019   History of TIA (transient ischemic attack) 10/23/2017   Chronic kidney disease, stage 3b ([place]) 07/21/2015   Hyperlipidemia, mixed 11/25/2013   Essential hypertension 11/25/2013    Resolved Hospital Problems  No resolved problems to display.    Discharge Instructions     Diet - low sodium heart healthy   Complete by: As directed    Increase activity slowly   Complete by: As directed       Allergies as of 06/16/2021       Reactions   Codeine Hives   Hctz [hydrochlorothiazide] Other (See Comments)   Low sodium   Meloxicam Nausea Only   Gastritis and nausea   Mysoline [primidone]    Pneumovax 23 [pneumococcal Vac Polyvalent]    Red, raised area.  Knot at site of injection   Toradol [ketorolac Tromethamine] Swelling   Localized redness at injection site.     Ace Inhibitors Cough        Medication List     TAKE these medications    acetaminophen 500 MG tablet Commonly known as: TYLENOL Take 500 mg by mouth every 6 (six) hours as needed for moderate pain.   apixaban 5 MG Tabs tablet Commonly known as: ELIQUIS Take 2 tablets twice a day until 06/22/2021.  Then take 1 tablet twice a day starting on 06/23/2021.   aspirin EC 81 MG tablet Take 1 tablet (81 mg total) by  mouth daily. Swallow whole.   atorvastatin 10 MG tablet Commonly known as: LIPITOR Take 1 tablet Daily for Cholesterol What changed:  how much to take how to take this when to take this   Calcium-Magnesium-Vitamin D 300-20-200 MG-MG-UNIT Chew Chew 1 tablet daily by mouth.   clonazePAM 0.5 MG tablet Commonly known as: KLONOPIN TAKE 1 TABLET BY MOUTH EVERY MORNING AND 1/2 TABLET AT NOON AND 1/2 TABLET AT NIGHT What changed:  how much to take how to take this when to take this   hydroxypropyl methylcellulose / hypromellose 2.5 % ophthalmic solution Commonly known as: ISOPTO TEARS / [place] into both eyes daily as needed for dry eyes.   Magnesium 250 MG Tabs Take 250 mg by mouth 2 (two) times a day.   olmesartan 5 MG tablet Commonly known as: BENICAR Take 5 mg by mouth daily as needed (if blood pressure is too high). For blood pressure only if 150/90+   propranolol 40 MG tablet Commonly known as: INDERAL TAKE 1 TABLET BY MOUTH FOUR TIMES DAILY WITH MEALS AND BEDTIME FOR BLOOD PRESSURE AND  TREMOR What changed:  how much to take how to take this when to take this additional instructions   topiramate 25 MG tablet Commonly known as: TOPAMAX TAKE 1 TABLET(25 MG) BY MOUTH AT BEDTIME What changed: See the new instructions.   vitamin B-12 500 MCG tablet Commonly known as: CYANOCOBALAMIN Take 500 mcg by mouth daily. Sublingual   Vitamin D3 125 MCG (5000 UT) Tabs Take 2 tablets by mouth daily.        Follow-up Information     Unk Pinto, MD. Schedule an appointment as soon as possible for a visit in 2 week(s).   Specialty: Internal Medicine Why: Will need referral to pulmonology for acute PE and discussion on anticoag duration Contact information: [id number] [place] [phone]         Fay Records, MD .   Specialty: Cardiology Contact information: [address] [place]  [address] [phone]                Allergies  Allergen Reactions   Codeine Hives   Hctz [Hydrochlorothiazide] Other (See Comments)    Low sodium   Meloxicam Nausea Only    Gastritis and nausea   Mysoline [Primidone]    Pneumovax 23 [Pneumococcal Vac Polyvalent]     Red, raised area.  Knot at site of injection   Toradol [Ketorolac Tromethamine] Swelling    Localized redness at injection site.     Ace Inhibitors Cough    Consultations:   Discharge Exam: BP 139/72 (BP Location: Left Arm)   Pulse 72   Temp 97.9 F (36.6 C) (Oral)   Resp 16   Ht _0  (1.626 m)   Wt 67.1 kg   SpO2 99%   BMI 25.40 kg/m  General appearance: alert, cooperative, and no distress Head: Normocephalic, without obvious abnormality, atraumatic Eyes:  EOMI Lungs: subtle coarse sounds bibasilar otherwise clear throughout Heart: regular rate and rhythm and S1, S2 normal Abdomen: normal findings: bowel sounds normal and soft, non-tender Extremities:  no further appreciated edema Skin: mobility and turgor normal Neurologic: Grossly normal  The results of significant diagnostics from this hospitalization (including imaging, microbiology, ancillary and laboratory) are listed below for reference.   Microbiology: Recent Results (from the past 240 hour(s))  Urine Culture     Status: None   Collection Time: 06/13/21  3:29 PM   Specimen: Urine  Result Value Ref Range Status   MICRO NUMBER: [id number]  Final   SPECIMEN QUALITY: Adequate  Final   Sample Source URINE  Final   STATUS: FINAL  Final   ISOLATE 1:   Final    Mixed genital flora isolated. These superficial bacteria are not indicative of a urinary tract infection. No further organism identification is warranted on this specimen. If clinically indicated, recollect clean-catch, mid-stream urine and transfer  immediately to Urine Culture Transport Tube.   MICROSCOPIC MESSAGE     Status: None   Collection Time: 06/13/21  3:29 PM  Result Value  Ref Range Status   Note   Final    Comment: This urine was analyzed for the presence of WBC,  RBC, bacteria, casts, and other formed elements.  Only those elements seen were reported. . .   SARS CORONAVIRUS 2 (TAT 6-24 HRS) Nasopharyngeal Nasopharyngeal Swab     Status: None   Collection Time: 06/14/21  6:36 PM   Specimen: Nasopharyngeal Swab  Result Value Ref Range Status   SARS Coronavirus 2 NEGATIVE NEGATIVE  Final    Comment: (NOTE) SARS-CoV-2 target nucleic acids are NOT DETECTED.  The SARS-CoV-2 RNA is generally detectable in upper and lower respiratory specimens during the acute phase of infection. Negative results do not preclude SARS-CoV-2 infection, do not rule out co-infections with other pathogens, and should not be used as the sole basis for treatment or other patient management decisions. Negative results must be combined with clinical observations, patient history, and epidemiological information. The expected result is Negative.  Fact Sheet for Patients: SugarRoll.be  Fact Sheet for Healthcare Providers: https://www.woods-mathews.com/  This test is not yet approved or cleared by the [place] FDA and  has been authorized for detection and/or diagnosis of SARS-CoV-2 by FDA under an Emergency Use Authorization (EUA). This EUA will remain  in effect (meaning this test can be used) for the duration of the COVID-19 declaration under Se ction 564(b)(1) of the Act, 21 U.S.C. section 360bbb-3(b)(1), unless the authorization is terminated or revoked sooner.  Performed at [facility], [place] [address] [place], [place] [address]      Labs: BNP (last 3 results) Recent Labs    06/14/21 1607  BNP 892.1*   Basic Metabolic Panel: Recent Labs  Lab 06/13/21 1529 06/14/21 1607 06/15/21 0402 06/16/21 0440  NA 138 134* 138 139  K 4.6 4.2 4.4 4.1  CL 104 100 108 109  CO2 _0 GLUCOSE 114* 119* 116* 105*   BUN _1 26*  CREATININE 1.25* 1.17* 1.02* 1.36*  CALCIUM 9.8 9.2 9.2 8.9  MG  --   --  2.0 1.8  PHOS  --   --  3.5  --    Liver Function Tests: Recent Labs  Lab 06/13/21 1529 06/15/21 0402  AST 16 35  ALT 12 33  ALKPHOS  --  41  BILITOT 0.8 0.9  PROT 6.5 6.5  ALBUMIN  --  3.6   No results for input(s): LIPASE, AMYLASE in the last 168 hours. No results for input(s): AMMONIA in the last 168 hours. CBC: Recent Labs  Lab 06/13/21 1529 06/14/21 1607 06/15/21 0402 06/16/21 0440  WBC 8.1 7.2 6.8 5.6  NEUTROABS 5,176 4.2 4.1  --   HGB 15.1 14.7 14.2 13.8  HCT 46.8* 45.8 44.8 43.3  MCV 87.8 89.5 89.6 88.9  PLT 330 320 286 316   Cardiac Enzymes: No results for input(s): CKTOTAL, CKMB, CKMBINDEX, TROPONINI in the last 168 hours. BNP: Invalid input(s): POCBNP CBG: No results for input(s): GLUCAP in the last 168 hours. D-Dimer No results for input(s): DDIMER in the last 72 hours. Hgb A1c No results for input(s): HGBA1C in the last 72 hours. Lipid Profile No results for input(s): CHOL, HDL, LDLCALC, TRIG, CHOLHDL, LDLDIRECT in the last 72 hours. Thyroid function studies Recent Labs    06/15/21 0402  TSH 1.380   Anemia work up No results for input(s): VITAMINB12, FOLATE, FERRITIN, TIBC, IRON, RETICCTPCT in the last 72 hours. Urinalysis    Component Value Date/Time   COLORURINE YELLOW 06/13/2021 1529   APPEARANCEUR CLEAR 06/13/2021 1529   LABSPEC 1.008 06/13/2021 1529   PHURINE 6.0 06/13/2021 1529   GLUCOSEU NEGATIVE 06/13/2021 1529   HGBUR NEGATIVE 06/13/2021 1529   BILIRUBINUR NEGATIVE 05/06/2017 1603   KETONESUR NEGATIVE 06/13/2021 1529   PROTEINUR NEGATIVE 06/13/2021 1529   UROBILINOGEN 0.2 04/19/2015 1103   NITRITE NEGATIVE 06/13/2021 1529   LEUKOCYTESUR 2+ (A) 06/13/2021 1529   Sepsis Labs Invalid input(s): PROCALCITONIN,  WBC,  LACTICIDVEN Microbiology  Recent Results (from the past 240 hour(s))  Urine Culture     Status: None   Collection Time:  06/13/21  3:29 PM   Specimen: Urine  Result Value Ref Range Status   MICRO NUMBER: [id number]  Final   SPECIMEN QUALITY: Adequate  Final   Sample Source URINE  Final   STATUS: FINAL  Final   ISOLATE 1:   Final    Mixed genital flora isolated. These superficial bacteria are not indicative of a urinary tract infection. No further organism identification is warranted on this specimen. If clinically indicated, recollect clean-catch, mid-stream urine and transfer  immediately to Urine Culture Transport Tube.   MICROSCOPIC MESSAGE     Status: None   Collection Time: 06/13/21  3:29 PM  Result Value Ref Range Status   Note   Final    Comment: This urine was analyzed for the presence of WBC,  RBC, bacteria, casts, and other formed elements.  Only those elements seen were reported. . .   SARS CORONAVIRUS 2 (TAT 6-24 HRS) Nasopharyngeal Nasopharyngeal Swab     Status: None   Collection Time: 06/14/21  6:36 PM   Specimen: Nasopharyngeal Swab  Result Value Ref Range Status   SARS Coronavirus 2 NEGATIVE NEGATIVE Final    Comment: (NOTE) SARS-CoV-2 target nucleic acids are NOT DETECTED.  The SARS-CoV-2 RNA is generally detectable in upper and lower respiratory specimens during the acute phase of infection. Negative results do not preclude SARS-CoV-2 infection, do not rule out co-infections with other pathogens, and should not be used as the sole basis for treatment or other patient management decisions. Negative results must be combined with clinical observations, patient history, and epidemiological information. The expected result is Negative.  Fact Sheet for Patients: SugarRoll.be  Fact Sheet for Healthcare Providers: https://www.woods-mathews.com/  This test is not yet approved or cleared by the [place] FDA and  has been authorized for detection and/or diagnosis of SARS-CoV-2 by FDA under an Emergency Use Authorization (EUA). This EUA  will remain  in effect (meaning this test can be used) for the duration of the COVID-19 declaration under Se ction 564(b)(1) of the Act, 21 U.S.C. section 360bbb-3(b)(1), unless the authorization is terminated or revoked sooner.  Performed at [facility], [place] [address], [place], [place] [address]     Procedures/Studies: DG Chest 2 View  Result Date: 06/14/2021 CLINICAL DATA:  Shortness of breath. EXAM: CHEST - 2 VIEW COMPARISON:  06/13/2021 FINDINGS: The cardiac silhouette, mediastinal and hilar contours are within normal limits is stable. Chronic left basilar pleural thickening and parenchymal scarring. No acute pulmonary findings. No worrisome pulmonary lesions. The bony thorax is intact. IMPRESSION: Chronic left basilar pleural thickening and parenchymal scarring. No acute pulmonary findings. Electronically Signed   By: Marijo Sanes M.D.   On: 06/14/2021 16:14   DG Chest 2 View  Result Date: 06/14/2021 CLINICAL DATA:  Chest pain. EXAM: CHEST - 2 VIEW COMPARISON:  February 08, 2020. FINDINGS: The heart size and mediastinal contours are within normal limits. Minimal bibasilar subsegmental atelectasis is noted. The visualized skeletal structures are unremarkable. IMPRESSION: Minimal bibasilar subsegmental atelectasis. Electronically Signed   By: Marijo Conception M.D.   On: 06/14/2021 10:32   CT Angio Chest PE W and/or Wo Contrast  Result Date: 06/14/2021 CLINICAL DATA:  Shortness of breath and left-sided chest pain. EXAM: CT ANGIOGRAPHY CHEST WITH CONTRAST TECHNIQUE: Multidetector CT imaging of the chest was performed using the standard protocol during bolus administration of  intravenous contrast. Multiplanar CT image reconstructions and MIPs were obtained to evaluate the vascular anatomy. CONTRAST:  63m OMNIPAQUE IOHEXOL 350 MG/ML SOLN COMPARISON:  Radiograph earlier today. FINDINGS: Cardiovascular: Examination is positive for acute pulmonary embolus. There are filling defects within the  right and left pulmonary arterial system. On the right this involves the distal main pulmonary artery extending into all lobar and many segmental branches. On the left this involves the left lower lobe are branches, near occlusive, extending into the segmental and subsegmental branches. There is subsegmental left upper lobe involvement. Thromboembolic burden is moderate. There is right heart strain with RV to LV ratio of 1.2. Upper normal heart size. No pericardial effusion. Moderate aortic atherosclerosis with tortuosity. Conventional branching pattern from the aortic arch. No aortic dissection. Mediastinum/Nodes: No enlarged mediastinal or hilar lymph nodes. No esophageal wall thickening. Heterogeneously enlarged right lobe of the thyroid gland without definite dominant nodule. Lungs/Pleura: Small left greater than right pleural effusion with associated atelectasis. No evidence of pulmonary infarct. No pulmonary edema. No pulmonary mass. Motion artifact limits detailed assessment of the basis. Upper Abdomen: Motion obscured, bilateral renal cysts. Musculoskeletal: Exaggerated thoracic kyphosis with multilevel degenerative change. There are no acute or suspicious osseous abnormalities. Unremarkable chest wall soft tissues. Review of the MIP images confirms the above findings. IMPRESSION: 1. Examination is positive for acute pulmonary embolus with moderate clot burden and evidence of right heart strain, RV to LV ratio of 1.2. 2. Small left greater than right pleural effusions with associated atelectasis. Aortic Atherosclerosis (ICD10-I70.0). Critical Value/emergent results were called by telephone at the time of interpretation on 06/14/2021 at 5:54 pm to Dr RCarmin Muskrat, who verbally acknowledged these results. Electronically Signed   By: MKeith RakeM.D.   On: 06/14/2021 17:55   ECHOCARDIOGRAM COMPLETE  Result Date: 06/15/2021    ECHOCARDIOGRAM REPORT   Patient Name:   AWHITNIE DELEONDate of Exam:  06/15/2021 Medical Rec #:  [id number]      Height:       64.0 in Accession #:    [id number]     Weight:       148.0 lb Date of Birth:  818-Sep-1932      BSA:          1.721 m Patient Age:    89years        BP:           137/70 mmHg Patient Gender: F               HR:           78 bpm. Exam Location:  Inpatient Procedure: 2D Echo, Cardiac Doppler and Color Doppler Indications:    I26.02 Pulmonary embolus  History:        Patient has prior history of Echocardiogram examinations, most                 recent 10/24/2014. Stroke and TIA. Cancer.  Sonographer:    TJonelle SidleDance Referring Phys: [id number] 1. Left ventricular ejection fraction, by estimation, is 65 to 70%. The left ventricle has normal function. The left ventricle has no regional wall motion abnormalities. There is severe asymmetric left ventricular hypertrophy of the basal-septal segment. Left ventricular diastolic parameters are consistent with Grade I diastolic dysfunction (impaired relaxation).  2. Right ventricular systolic function is normal. The right ventricular size is normal. Tricuspid regurgitation signal is inadequate for assessing PA pressure.  3. The mitral valve  is normal in structure. No evidence of mitral valve regurgitation. No evidence of mitral stenosis.  4. The aortic valve was not well visualized. Aortic valve regurgitation is not visualized. No aortic stenosis is present.  5. The inferior vena cava is normal in size with greater than 50% respiratory variability, suggesting right atrial pressure of 3 mmHg. FINDINGS  Left Ventricle: Left ventricular ejection fraction, by estimation, is 65 to 70%. The left ventricle has normal function. The left ventricle has no regional wall motion abnormalities. The left ventricular internal cavity size was small. There is severe asymmetric left ventricular hypertrophy of the basal-septal segment. Left ventricular diastolic parameters are consistent with Grade I diastolic  dysfunction (impaired relaxation). Right Ventricle: The right ventricular size is normal. No increase in right ventricular wall thickness. Right ventricular systolic function is normal. Tricuspid regurgitation signal is inadequate for assessing PA pressure. Left Atrium: Left atrial size was normal in size. Right Atrium: Right atrial size was normal in size. Pericardium: Trivial pericardial effusion is present. Mitral Valve: The mitral valve is normal in structure. No evidence of mitral valve regurgitation. No evidence of mitral valve stenosis. Tricuspid Valve: The tricuspid valve is normal in structure. Tricuspid valve regurgitation is trivial. Aortic Valve: The aortic valve was not well visualized. Aortic valve regurgitation is not visualized. No aortic stenosis is present. Pulmonic Valve: The pulmonic valve was not well visualized. Pulmonic valve regurgitation is not visualized. Aorta: The aortic root and ascending aorta are structurally normal, with no evidence of dilitation. Venous: The inferior vena cava is normal in size with greater than 50% respiratory variability, suggesting right atrial pressure of 3 mmHg. IAS/Shunts: No atrial level shunt detected by color flow Doppler.  LEFT VENTRICLE PLAX 2D LVIDd:         3.00 cm  Diastology LVIDs:         2.30 cm  LV e' medial:    6.31 cm/s LV PW:         1.10 cm  LV E/e' medial:  11.1 LV IVS:        1.20 cm  LV e' lateral:   6.85 cm/s LVOT diam:     1.70 cm  LV E/e' lateral: 10.2 LV SV:         38 LV SV Index:   22 LVOT Area:     2.27 cm  RIGHT VENTRICLE             IVC RV Basal diam:  2.80 cm     IVC diam: 1.55 cm RV S prime:     12.70 cm/s TAPSE (M-mode): 1.7 cm LEFT ATRIUM             Index       RIGHT ATRIUM           Index LA diam:        4.10 cm 2.38 cm/m  RA Area:     10.00 cm LA Vol (A2C):   47.1 ml 27.36 ml/m RA Volume:   21.00 ml  12.20 ml/m LA Vol (A4C):   51.1 ml 29.69 ml/m LA Biplane Vol: 50.2 ml 29.16 ml/m  AORTIC VALVE LVOT Vmax:   87.30 cm/s  LVOT Vmean:  56.900 cm/s LVOT VTI:    0.169 m  AORTA Ao Root diam: 3.00 cm Ao Asc diam:  2.90 cm MITRAL VALVE MV Area (PHT): 3.65 cm     SHUNTS MV Decel Time: 208 msec     Systemic VTI:  0.17 m MV E  velocity: 69.80 cm/s   Systemic Diam: 1.70 cm MV A velocity: 110.00 cm/s MV E/A ratio:  0.63 Oswaldo Milian MD Electronically signed by Oswaldo Milian MD Signature Date/Time: 06/15/2021/11:29:15 AM    Final    VAS [place] LOWER EXTREMITY VENOUS (DVT)  Result Date: 06/15/2021  Lower Venous DVT Study Patient Name:  SIMARA RHYNER  Date of Exam:   06/15/2021 Medical Rec #: [id number]        Accession #:    [id number] Date of Birth: 17-Feb-1931        Patient Gender: F Patient Age:   89Y Exam [facility]:  [facility] Procedure:      VAS [place] LOWER EXTREMITY VENOUS (DVT) Referring Phys: [place] --------------------------------------------------------------------------------  Indications: Pulmonary embolism.  Risk Factors: PT stated HX of PE post hysterectomy - not in EMR. Comparison Study: Previous exam 04/21/18 - negative Performing Technologist: Rogelia Rohrer RVT, RDMS  Examination Guidelines: A complete evaluation includes B-mode imaging, spectral Doppler, color Doppler, and power Doppler as needed of all accessible portions of each vessel. Bilateral testing is considered an integral part of a complete examination. Limited examinations for reoccurring indications may be performed as noted. The reflux portion of the exam is performed with the patient in reverse Trendelenburg.  +---------+---------------+---------+-----------+----------+--------------+ RIGHT    CompressibilityPhasicitySpontaneityPropertiesThrombus Aging +---------+---------------+---------+-----------+----------+--------------+ CFV      Full           Yes      Yes                                 +---------+---------------+---------+-----------+----------+--------------+ SFJ      Full                                                         +---------+---------------+---------+-----------+----------+--------------+ FV Prox  Full           Yes      Yes                                 +---------+---------------+---------+-----------+----------+--------------+ FV Mid   Full           Yes      Yes                                 +---------+---------------+---------+-----------+----------+--------------+ FV DistalFull           Yes      Yes                                 +---------+---------------+---------+-----------+----------+--------------+ PFV      Full                                                        +---------+---------------+---------+-----------+----------+--------------+ POP      Full           Yes      Yes                                 +---------+---------------+---------+-----------+----------+--------------+  PTV      Full                                                        +---------+---------------+---------+-----------+----------+--------------+ PERO     Full                                                        +---------+---------------+---------+-----------+----------+--------------+   +---------+---------------+---------+-----------+----------+--------------+ LEFT     CompressibilityPhasicitySpontaneityPropertiesThrombus Aging +---------+---------------+---------+-----------+----------+--------------+ CFV      Full           Yes      Yes                                 +---------+---------------+---------+-----------+----------+--------------+ SFJ      Full                                                        +---------+---------------+---------+-----------+----------+--------------+ FV Prox  Full           Yes      Yes                                 +---------+---------------+---------+-----------+----------+--------------+ FV Mid   Full           Yes      Yes                                  +---------+---------------+---------+-----------+----------+--------------+ FV DistalFull           Yes      Yes                                 +---------+---------------+---------+-----------+----------+--------------+ PFV      Full                                                        +---------+---------------+---------+-----------+----------+--------------+ POP      Full           Yes      Yes                                 +---------+---------------+---------+-----------+----------+--------------+ PTV      Full                                                        +---------+---------------+---------+-----------+----------+--------------+  PERO     Full                                                        +---------+---------------+---------+-----------+----------+--------------+     Summary: BILATERAL: - No evidence of deep vein thrombosis seen in the lower extremities, bilaterally. - No evidence of superficial venous thrombosis in the lower extremities, bilaterally. -No evidence of popliteal cyst, bilaterally.   *See table(s) above for measurements and observations.    Preliminary      Time coordinating discharge: Over 30 minutes    Dwyane Dee, MD  [facility] 06/16/2021, 3:06 PM

## 2021-06-16 NOTE — Evaluation (Signed)
Physical Therapy Evaluation Patient Details Name: Monica Curry MRN: [id number] DOB: [birth year] Today's Date: 06/16/2021   History of Present Illness  87 y.o. female admitted with Bilateral PEs, began heparin drip on 06/14/2021.  PMH for HTN, endometrial cancer, essential tremor currently on propranolol and Klonopin, postoperative DVT and PE in 1994  Clinical Impression  Patient evaluated by Physical Therapy with no further acute PT needs identified. All education has been completed and the patient has no further questions.  PT amb hallway distance, no LOB with challenges, reviewed home safety techniques. See below for details/balance/gait velocity etc. No further needs. Pt states she is frustrated that the hospital is sending her home alone. RN is aware   See below for any follow-up Physical Therapy or equipment needs. PT is signing off. Thank you for this referral.     Follow Up Recommendations No PT follow up    Equipment Recommendations  None recommended by PT    Recommendations for Other Services       Precautions / Restrictions Precautions Precautions: Fall Precaution Comments: reviewed fall precautions for home. incr time with transitional movements. adequate and safe footwear Restrictions Weight Bearing Restrictions: No      Mobility  Bed Mobility               General bed mobility comments: pt sitting EOB on PT arrival    Transfers Overall transfer level: Independent Equipment used: None             General transfer comment: no device or assist needed  Ambulation/Gait Ambulation/Gait assistance: Supervision;Modified independent (Device/Increase time);Independent Gait Distance (Feet): 150 Feet Assistive device: None Gait Pattern/deviations: Step-through pattern;Decreased stride length Gait velocity: 1.83 Gait velocity interpretation: 1.31 - 2.62 ft/sec, indicative of limited community ambulator General Gait Details: steady gait, no LOB. pt guarded  initially however incr UE swing and incr stride length noted with incr distance.  Stairs            Wheelchair Mobility    Modified Rankin (Stroke Patients Only)       Balance Overall balance assessment: Independent Sitting-balance support: No upper extremity supported;Feet supported Sitting balance-Leahy Scale: Normal       Standing balance-Leahy Scale: Good               High level balance activites: Side stepping;Backward walking;Direction changes;Turns;Sudden stops;Head turns High Level Balance Comments: no LOB with above             Pertinent Vitals/Pain Pain Assessment: No/denies pain    Home Living Family/patient expects to be discharged to:: Private residence Living Arrangements: Alone   Type of Home: House Home Access: Stairs to enter Entrance Stairs-Rails: Psychiatric nurse of Steps: 3 Home Layout: Two level;Able to live on main level with bedroom/bathroom;Other (Comment) Home Equipment: Cane - single point;Bedside commode      Prior Function Level of Independence: Independent         Comments: Reports she still drives, does her own grocery shopping, housework, and some yard work. Pt reports that she was active with [facility] for vertigo. Last session was "a couple of weeks ago".     Hand Dominance        Extremity/Trunk Assessment   Upper Extremity Assessment Upper Extremity Assessment: Overall WFL for tasks assessed    Lower Extremity Assessment Lower Extremity Assessment: Overall WFL for tasks assessed       Communication      Cognition Arousal/Alertness: Awake/alert Behavior During Therapy: [facility] for tasks  assessed/performed Overall Cognitive Status: Within Functional Limits for tasks assessed                                        General Comments      Exercises     Assessment/Plan    PT Assessment Patent does not need any further PT services  PT Problem List          PT Treatment Interventions      PT Goals (Current goals can be found in the Care Plan section)  Acute Rehab PT Goals Patient Stated Goal: to get dressed PT Goal Formulation: All assessment and education complete, DC therapy    Frequency     Barriers to discharge        Co-evaluation               AM-PAC PT "6 Clicks" Mobility  Outcome Measure Help needed turning from your back to your side while in a flat bed without using bedrails?: None Help needed moving from lying on your back to sitting on the side of a flat bed without using bedrails?: None Help needed moving to and from a bed to a chair (including a wheelchair)?: None Help needed standing up from a chair using your arms (e.g., wheelchair or bedside chair)?: None Help needed to walk in hospital room?: None Help needed climbing 3-5 steps with a railing? : None 6 Click Score: 24    End of Session Equipment Utilized During Treatment: Gait belt Activity Tolerance: Patient tolerated treatment well Patient left: in bed;with call bell/phone within reach;with bed alarm set Nurse Communication: Mobility status PT Visit Diagnosis: Other abnormalities of gait and mobility (R26.89)    Time: 3664-4034 PT Time Calculation (min) (ACUTE ONLY): 17 min   Charges:   PT Evaluation $PT Eval Low Complexity: [place], PT  Acute Rehab Dept ([place]) [phone] Pager [phone]  06/16/2021   [facility] 06/16/2021, 1:55 PM

## 2021-06-16 NOTE — Progress Notes (Signed)
Discharge paperwork given and explained to patient utilizing the TeachBack method. PIV removed. Pressure Dressing applied. Pt awaiting transport.

## 2021-06-16 NOTE — Progress Notes (Signed)
Pt stated she thought she was getting discharged Monday or Tuesday. Notified MD regarding her concerns. MD stated patient is medically cleared to go home. Pt notified. Will continue with the discharge process.

## 2021-06-16 NOTE — Evaluation (Signed)
Occupational Therapy Evaluation Patient Details Name: Monica Curry MRN: [id number] DOB: June 27, 1931 Today's Date: 06/16/2021    History of Present Illness 87 y.o. female admitted with Bilateral PEs, began heparin drip on 06/14/2021.  PMH for HTN, endometrial cancer, essential tremor currently on propranolol and Klonopin, postoperative DVT and PE in 1994   Clinical Impression   Patient evaluated by Occupational Therapy with no further acute OT needs identified. Pt able to demonstrate her standing ADLs x over 10 min without loss of balance, and functional mobility needed to return home at her baseline.  All education has been completed and the patient has no further questions.  See below for any follow-up Occupational Therapy or equipment needs. OT is signing off. Thank you for this referral.     Follow Up Recommendations  No OT follow up    Equipment Recommendations  None recommended by OT    Recommendations for Other Services       Precautions / Restrictions Precautions Precautions: Fall Restrictions Weight Bearing Restrictions: No      Mobility Bed Mobility Overal bed mobility: Modified Independent             General bed mobility comments: Partially elevated HOB, no use of rails, supine->sit Mod I with increased time.    Transfers Overall transfer level: Modified independent Equipment used: None             General transfer comment: See ADL section.    Balance Overall balance assessment: Independent                                         ADL either performed or assessed with clinical judgement   ADL Overall ADL's : At baseline                                       General ADL Comments: Pt able to demonstrate Mod I bed mobility, Independent sit <> stand, standing at sink for grooming and sponge bathing upper and lower body with good standing balance noted, LE dressing, UE dressing all at baseline. Pt only needs assist  for management of IV.  Pt is cautious and takes time between position changes due to teachings through [facility] per pt.     Vision   Vision Assessment?: No apparent visual deficits     Perception     Praxis      Pertinent Vitals/Pain Pain Assessment: No/denies pain     Hand Dominance     Extremity/Trunk Assessment Upper Extremity Assessment Upper Extremity Assessment: Overall WFL for tasks assessed   Lower Extremity Assessment Lower Extremity Assessment: Defer to PT evaluation   Cervical / Trunk Assessment Cervical / Trunk Assessment: Normal   Communication     Cognition Arousal/Alertness: Awake/alert Behavior During Therapy: WFL for tasks assessed/performed Overall Cognitive Status: Within Functional Limits for tasks assessed                                 General Comments: A&Ox4   General Comments       Exercises     Shoulder Instructions      Home Living     Available Help at Discharge:  (None)  Prior Functioning/Environment          Comments: Reports she still drives, does her own grocery shopping, housework, and some yard work. Pt reports that she was active with [facility] for vertigo. Last session was "a couple of weeks ago".        OT Problem List: Decreased activity tolerance      OT Treatment/Interventions:      OT Goals(Current goals can be found in the care plan section) Acute Rehab OT Goals Patient Stated Goal: "To get out of this bed, out of this gown, and smell better." OT Goal Formulation: With patient Time For Goal Achievement: 06/16/21 Potential to Achieve Goals: Good  OT Frequency:     Barriers to D/C:            Co-evaluation              AM-PAC OT "6 Clicks" Daily Activity     Outcome Measure Help from another person eating meals?: None Help from another person taking care of personal grooming?: None Help from another person  toileting, which includes using toliet, bedpan, or urinal?: None Help from another person bathing (including washing, rinsing, drying)?: None Help from another person to put on and taking off regular upper body clothing?: None Help from another person to put on and taking off regular lower body clothing?: None 6 Click Score: 24   End of Session Equipment Utilized During Treatment: Gait belt Nurse Communication:  (Per RN, up tochair okay without chair alarm.)  Activity Tolerance: Patient tolerated treatment well Patient left: in chair;with call bell/phone within reach  OT Visit Diagnosis: Dizziness and giddiness (R42)                Time: 0822-0900 OT Time Calculation (min): 38 min Charges:  OT General Charges $OT Visit: 1 Visit OT Evaluation $OT Eval Low Complexity: 1 Low OT Treatments $Self Care/Home Management : 8-22 mins $Therapeutic Activity: 8-22 mins  Anderson Malta, [place] Office: [phone] 06/16/2021  Julien Girt 06/16/2021, 9:06 AM

## 2021-06-16 NOTE — Discharge Instructions (Signed)
Information on my medicine - ELIQUIS (apixaban)  This medication education was reviewed with me or my healthcare representative as part of my discharge preparation.  The pharmacist that spoke with me during my hospital stay was:  Leeroy Bock, [facility]  Why was Eliquis prescribed for you? Eliquis was prescribed to treat blood clots that may have been found in the veins of your legs (deep vein thrombosis) or in your lungs (pulmonary embolism) and to reduce the risk of them occurring again.  What do You need to know about Eliquis ? The starting dose is 10 mg (two 5 mg tablets) taken TWICE daily for the FIRST SEVEN (7) DAYS, then on 7/16 the dose is reduced to ONE 5 mg tablet taken TWICE daily.  Eliquis may be taken with or without food.   Try to take the dose about the same time in the morning and in the evening. If you have difficulty swallowing the tablet whole please discuss with your pharmacist how to take the medication safely.  Take Eliquis exactly as prescribed and DO NOT stop taking Eliquis without talking to the doctor who prescribed the medication.  Stopping may increase your risk of developing a new blood clot.  Refill your prescription before you run out.  After discharge, you should have regular check-up appointments with your healthcare provider that is prescribing your Eliquis.    What do you do if you miss a dose? If a dose of ELIQUIS is not taken at the scheduled time, take it as soon as possible on the same day and twice-daily administration should be resumed. The dose should not be doubled to make up for a missed dose.  Important Safety Information A possible side effect of Eliquis is bleeding. You should call your healthcare provider right away if you experience any of the following: Bleeding from an injury or your nose that does not stop. Unusual colored urine (red or dark brown) or unusual colored stools (red or black). Unusual bruising for unknown  reasons. A serious fall or if you hit your head (even if there is no bleeding).  Some medicines may interact with Eliquis and might increase your risk of bleeding or clotting while on Eliquis. To help avoid this, consult your healthcare provider or pharmacist prior to using any new prescription or non-prescription medications, including herbals, vitamins, non-steroidal anti-inflammatory drugs (NSAIDs) and supplements.  This website has more information on Eliquis (apixaban): http://[email or web address].com/eliquis/home

## 2021-06-16 NOTE — Progress Notes (Signed)
ANTICOAGULATION CONSULT NOTE - Follow Up Consult  Pharmacy Consult for apixaban Indication: pulmonary embolus  Allergies  Allergen Reactions   Codeine Hives   Hctz [Hydrochlorothiazide] Other (See Comments)    Low sodium   Meloxicam Nausea Only    Gastritis and nausea   Mysoline [Primidone]    Pneumovax 23 [Pneumococcal Vac Polyvalent]     Red, raised area.  Knot at site of injection   Toradol [Ketorolac Tromethamine] Swelling    Localized redness at injection site.     Ace Inhibitors Cough    Patient Measurements: Height: 5\' 4"  (162.6 cm) Weight: 67.1 kg (148 lb) IBW/kg (Calculated) : 54.7 Heparin Dosing Weight: TBW  Vital Signs: Temp: 98.2 F (36.8 C) (07/09 0434) Temp Source: Oral (07/09 0434) BP: 133/67 (07/09 0434) Pulse Rate: 83 (07/09 0434)  Labs: Recent Labs    06/14/21 1607 06/14/21 1607 06/15/21 0402 06/15/21 1238 06/15/21 2018 06/16/21 0440  HGB 14.7  --  14.2  --   --  13.8  HCT 45.8  --  44.8  --   --  43.3  PLT 320  --  286  --   --  316  APTT 29  --   --   --   --   --   LABPROT 15.2  --   --   --   --   --   INR 1.2  --   --   --   --   --   HEPARINUNFRC  --    < > 0.26* 0.43 0.57 0.57  CREATININE 1.17*  --  1.02*  --   --  1.36*  TROPONINIHS 4  --   --   --   --   --    < > = values in this interval not displayed.     Estimated Creatinine Clearance: 27.5 mL/min (A) (by C-G formula based on SCr of 1.36 mg/dL (H)).   Medications:  Infusions:   heparin 1,100 Units/hr (06/15/21 0447)    Assessment: 71 yoF admitted on 7/7 with new PE.  No current prior to admission anticoagulants noted.  She has a history of PE/DVT in 1997 for which she was on warfarin for many years.  Baseline coags WNL.  Pharmacy was initially consulted to dose Heparin, and now consulted to transition to apixaban  Today, 06/16/2021: Heparin level 0.57, therapeutic on heparin 1100 units/hr CBC: Hgb remains stable, WNL.  Plt WNL. No bleeding or complications reported.   SCr increased to 1.36  Goal of Therapy:  Heparin level 0.3-0.7 units/ml Monitor platelets by anticoagulation protocol: Yes   Plan:  D/C heparin drip at 10:00 At that time, start Apixaban 10 mg PO BID x 7 days, followed by apixaban 5 mg PO BID. Pharmacy to provide education and coupon prior to discharge.      Gretta Arab PharmD, BCPS Clinical Pharmacist [facility] main pharmacy [phone] 06/16/2021 8:13 AM

## 2021-06-18 ENCOUNTER — Telehealth: Payer: Self-pay

## 2021-06-18 ENCOUNTER — Telehealth: Payer: Self-pay | Admitting: Internal Medicine

## 2021-06-18 NOTE — Telephone Encounter (Signed)
Patient has follow up with PCP tomorrow.  Still needs pulmonary consult as mentioned on dc instructions.  Follow up in cardiology is 07/11/21 with APP.  Will route to Dr. Harrington Challenger to let her know.

## 2021-06-18 NOTE — Telephone Encounter (Signed)
Spoke with the patient and let her know Dr. Harrington Challenger has reviewed and is in agreement with follow up.  She is pleased to hear this and will review everything w her PCP tomorrow.  She is feeling okay, not a lot of energy yet at this point.  Will continue to take activities as tolerated.

## 2021-06-18 NOTE — Telephone Encounter (Signed)
Patient states she was admitted Thursday night to the hospital, got a room midnight, and was discharged Saturday at noon. She states her first indication something was wrong was Wednesday, when she started having pain in her back and lungs. She states she saw her PCP and the did tests. She says they did a chest x ray which showed nothing and was told to go to the ED if the pain continued. She states at the hospital a CT showed she had blood clots. She says they found large blood clots in both lungs. She would like Dr. Harrington Challenger to review the finds from the hospital and receive a call back from a nurse, since she is not scheduled until August.

## 2021-06-18 NOTE — Telephone Encounter (Signed)
Called patient on 06/18/2021 , 2:40 PM in an attempt to reach the patient for a hospital follow up.   Admit date: 06/14/21 Discharge: 06/16/21   She does have any questions or concerns about medications from the hospital admission. The patient's medications were reviewed over the phone, they were counseled to bring in all current medications to the hospital follow up visit.   I advised the patient to call if any questions or concerns arise about the hospital admission or medications    Home health was not started in the hospital.  All questions were answered and a follow up appointment was made.   Prior to Admission medications   Medication Sig Start Date End Date Taking? Authorizing Provider  acetaminophen (TYLENOL) 500 MG tablet Take 500 mg by mouth every 6 (six) hours as needed for moderate pain.    [provider]  apixaban (ELIQUIS) 5 MG TABS tablet Take 2 tablets twice a day until 06/22/2021.  Then take 1 tablet twice a day starting on 06/23/2021. 06/16/21   Dwyane Dee, MD  aspirin EC 81 MG tablet Take 1 tablet (81 mg total) by mouth daily. Swallow whole. 04/17/21   Fay Records, MD  atorvastatin (LIPITOR) 10 MG tablet Take 1 tablet Daily for Cholesterol 05/28/21   Unk Pinto, MD  Calcium-Magnesium-Vitamin D 300-20-200 MG-MG-UNIT CHEW Chew 1 tablet daily by mouth.     [provider]  Cholecalciferol (VITAMIN D3) 5000 units TABS Take 2 tablets by mouth daily.     [provider]  clonazePAM (KLONOPIN) 0.5 MG tablet TAKE 1 TABLET BY MOUTH EVERY MORNING AND 1/2 TABLET AT NOON AND 1/2 TABLET AT NIGHT 06/13/21   [name], NP  hydroxypropyl methylcellulose / hypromellose (ISOPTO TEARS / GONIOVISC) 2.5 % ophthalmic solution Place into both eyes daily as needed for dry eyes.    [provider]  Magnesium 250 MG TABS Take 250 mg by mouth 2 (two) times a day.    [provider]  olmesartan (BENICAR) 5 MG tablet Take 5 mg by mouth daily as needed  (if blood pressure is too high). For blood pressure only if 150/90+    [provider]  propranolol (INDERAL) 40 MG tablet TAKE 1 TABLET BY MOUTH FOUR TIMES DAILY WITH MEALS AND BEDTIME FOR BLOOD PRESSURE AND TREMOR 02/15/21   [name], NP  topiramate (TOPAMAX) 25 MG tablet TAKE 1 TABLET(25 MG) BY MOUTH AT BEDTIME 03/12/21   [name], NP  vitamin B-12 (CYANOCOBALAMIN) 500 MCG tablet Take 500 mcg by mouth daily. Sublingual    [provider]

## 2021-06-19 ENCOUNTER — Ambulatory Visit: Payer: Medicare PPO | Admitting: Internal Medicine

## 2021-06-19 ENCOUNTER — Other Ambulatory Visit: Payer: Self-pay

## 2021-06-19 ENCOUNTER — Encounter: Payer: Self-pay | Admitting: Internal Medicine

## 2021-06-19 VITALS — BP 133/77 | HR 85 | Temp 97.3°F | Resp 16 | Ht 64.0 in | Wt 147.7 lb

## 2021-06-19 DIAGNOSIS — I10 Essential (primary) hypertension: Secondary | ICD-10-CM | POA: Diagnosis not present

## 2021-06-19 DIAGNOSIS — I471 Supraventricular tachycardia, unspecified: Secondary | ICD-10-CM

## 2021-06-19 DIAGNOSIS — N1832 Chronic kidney disease, stage 3b: Secondary | ICD-10-CM | POA: Diagnosis not present

## 2021-06-19 DIAGNOSIS — Z79899 Other long term (current) drug therapy: Secondary | ICD-10-CM | POA: Diagnosis not present

## 2021-06-19 DIAGNOSIS — I4510 Unspecified right bundle-branch block: Secondary | ICD-10-CM | POA: Diagnosis not present

## 2021-06-19 DIAGNOSIS — E782 Mixed hyperlipidemia: Secondary | ICD-10-CM | POA: Diagnosis not present

## 2021-06-19 DIAGNOSIS — Z8673 Personal history of transient ischemic attack (TIA), and cerebral infarction without residual deficits: Secondary | ICD-10-CM

## 2021-06-19 DIAGNOSIS — D6859 Other primary thrombophilia: Secondary | ICD-10-CM | POA: Diagnosis not present

## 2021-06-19 DIAGNOSIS — Z7901 Long term (current) use of anticoagulants: Secondary | ICD-10-CM

## 2021-06-19 DIAGNOSIS — G250 Essential tremor: Secondary | ICD-10-CM

## 2021-06-19 DIAGNOSIS — I2602 Saddle embolus of pulmonary artery with acute cor pulmonale: Secondary | ICD-10-CM | POA: Diagnosis not present

## 2021-06-19 DIAGNOSIS — I82411 Acute embolism and thrombosis of right femoral vein: Secondary | ICD-10-CM

## 2021-06-19 NOTE — Patient Instructions (Signed)
Pulmonary Embolism A pulmonary embolism (PE) is a sudden blockage or decrease of blood flow in one or both lungs that happens when a clot travels into the arteries of the lung (pulmonary arteries). Most blockages come from a blood clot that forms in the vein of a leg or arm (deep vein thrombosis, DVT) and travels to the lungs. A clot is blood that has thickened into a gel or solid. PE is a dangerous and life-threatening condition that needs to betreated right away. What are the causes? This condition is usually caused by a blood clot that forms in a vein and moves to the lungs. In rare cases, it may be caused by air, fat, part of a tumor, orother tissue that moves through the veins and into the lungs. What increases the risk? The following factors may make you more likely to develop this condition: Experiencing a traumatic injury, such as breaking a hip or leg. Having: A spinal cord injury. Major surgery, especially hip or knee replacement, or surgery on parts of the nervous system or on the abdomen. A stroke. A blood-clotting disease. Long-term (chronic) lung or heart disease. Cancer, especially if you are being treated with chemotherapy. A central venous catheter. Taking medicines that contain estrogen. These include birth control pills and hormone replacement therapy. Being: Pregnant. In the period of time after your baby is delivered (postpartum). Older than age 52. Overweight. A smoker, especially if you have other risks. Not very active (sedentary), not being able to move at all, or spending long periods sitting, such as travel over 6 hours. You are also at a greater risk if you have a leg in a cast or splint. What are the signs or symptoms? Symptoms of this condition usually start suddenly and include: Shortness of breath during activity or at rest. Coughing, coughing up blood, or coughing up bloody mucus. Chest pain, back pain, or shoulder blade pain that gets worse with deep  breaths. Rapid or irregular heartbeat. Feeling light-headed or dizzy, or fainting. Feeling anxious. Pain and swelling in a leg. This is a symptom of DVT, which can lead to PE. How is this diagnosed? This condition may be diagnosed based on your medical history, a physical exam, and tests. Tests may include: Blood tests. An ECG (electrocardiogram) of the heart. A CT pulmonary angiogram. This test checks blood flow in and around your lungs. A ventilation-perfusion scan, also called a lung VQ scan. This test measures air flow and blood flow to the lungs. An ultrasound to check for a DVT. How is this treated? Treatment for this condition depends on many factors, such as the cause of your PE, your risk for bleeding or developing more clots, and other medical conditions you may have. Treatment aims to stop blood clots from forming or growing larger. In some cases, treatment may be aimed at breaking apart or removing the blood clot. Treatment may include: Medicines, such as: Blood thinning medicines, also called anticoagulants, to stop clots from forming and growing. Medicines that break apart clots (fibrinolytics). Procedures, such as: Using a flexible tube to remove a blood clot (embolectomy) or to deliver medicine to destroy it (catheter-directed thrombolysis). Surgery to remove the clot (surgical embolectomy). This is rare. You may need a combination of immediate, long-term, and extended treatments. Your treatment may continue for several months (maintenance therapy) or longer depending on your medical conditions. You and your health care provider will work together to choose the treatment program that is best foryou. Follow these instructions at  home: Medicines Take over-the-counter and prescription medicines only as told by your health care provider. If you are taking blood thinners: Talk with your health care provider before you take any medicines that contain aspirin or NSAIDs, such as  ibuprofen. These medicines increase your risk for dangerous bleeding. Take your medicine exactly as told, at the same time every day. Avoid activities that could cause injury or bruising, and follow instructions about how to prevent falls. Wear a medical alert bracelet or carry a card that lists what medicines you take. Understand what foods and drugs interact with any medicines that you are taking. General instructions Ask your health care provider when you may return to your normal activities. Avoid sitting or lying for a long time without moving. Maintain a healthy weight. Ask your health care provider what weight is healthy for you. Do not use any products that contain nicotine or tobacco. These products include cigarettes, chewing tobacco, and vaping devices, such as e-cigarettes. If you need help quitting, ask your health care provider. Talk with your health care provider about any travel plans. It is important to make sure that you are still able to take your medicine while traveling. Keep all follow-up visits. This is important. Where to find more information American Lung Association: www.lung.org Centers for Disease Control and Prevention: http://www.wolf.info/ Contact a health care provider if: You missed a dose of your blood thinner medicine. You have a fever. Get help right away if: You have: New or increased pain, swelling, warmth, or redness in an arm or leg. Shortness of breath that gets worse during activity or at rest. Worsening chest pain. A rapid or irregular heartbeat. A severe headache. Vision changes. A serious fall or accident, or you hit your head. Blood in your vomit, stool, or urine. A cut that will not stop bleeding. You cough up blood. You feel light-headed or dizzy, and that feeling does not go away. You cannot move your arms or legs. You are confused or have memory loss. These symptoms may represent a serious problem that is an emergency. Do not wait to see if the  symptoms will go away. Get medical help right away. Call your local emergency services (911 in the [place] Do not drive yourself to the hospital. Summary A pulmonary embolism (PE) is a serious and potentially life-threatening condition. It happens when a blood clot from one part of the body travels to the arteries of the lung, causing a sudden blockage or decrease of blood flow to the lungs. This may result in shortness of breath, chest pain, dizziness, and fainting. Treatments for this condition usually include medicines to thin your blood (anticoagulants) or medicines to break apart blood clots. If you are given blood thinners, take your medicine exactly as told by your health care provider, at the same time every day. This is important. Understand what foods and drugs interact with any medicines that you are taking. If you have signs of PE or DVT, call your local emergency services (911 in the [place] This information is not intended to replace advice given to you by your health care provider. Make sure you discuss any questions you have with your healthcare provider. Document Revised: 10/27/2020 Document Reviewed: 10/27/2020 Elsevier Patient Education  2022 [employer].

## 2021-06-19 NOTE — Progress Notes (Signed)
[place]     This very nice 87 y.o.  WWF  was admitted to the hospital on  06/14/2021  and patient was discharged from the hospital on 06/16/2021. The patient now presents for follow up for transition from recent hospitalization.  The day after discharge  our clinical staff contacted the patient to assure stability and schedule a follow up appointment. The discharge summary, medications and diagnostic test results were reviewed before meeting with the patient. 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 Dopplers of legs were negative for DVT. Cardiac Echo was negative for Rt heart strain .        Hospitalization discharge instructions and medications are reconciled with the patient.      Patient is also followed with Hypertension, Hyperlipidemia, Pre-Diabetes and Vitamin D Deficiency.      Patient is treated for HTN & BP has been controlled at home. Today's BP is at goal - 133/77. Patient has had no complaints of any cardiac type chest pain, palpitations, dyspnea/orthopnea/PND, dizziness, claudication, or dependent edema.     Hyperlipidemia is controlled with diet & meds. Patient denies myalgias or other med SE's. Last Lipids were  at goal:  Lab Results  Component Value Date   CHOL 117 04/10/2021   HDL 47 (L) 04/10/2021   LDLCALC 52 04/10/2021   TRIG 94 04/10/2021   CHOLHDL 2.5 04/10/2021      Also, the patient has history of PreDiabetes and has had no symptoms of  reactive hypoglycemia, diabetic polys, paresthesias or visual blurring.  Last A1c was normal & at goal:  Lab Results  Component Value Date   HGBA1C 5.4 04/10/2021      Further, the patient also has history of Vitamin D Deficiency and supplements vitamin D without any suspected side-effects. Last vitamin D was  at goal:  Lab Results  Component Value Date   VD25OH 88 04/10/2021   Current Outpatient Medications on File Prior to Visit  Medication Sig   acetaminophen (TYLENOL) 500 MG tablet Take 500 mg by mouth every 6 (six) hours as needed for moderate pain.   apixaban (ELIQUIS) 5 MG TABS tablet Take 2 tablets twice a day until 06/22/2021.  Then take 1 tablet twice a day starting on 06/23/2021.   aspirin EC 81 MG tablet Take 1 tablet daily.    atorvastatin (LIPITOR) 10 MG tablet Take 1 tablet Daily for Cholesterol   Calcium-Magnesium-Vitamin D 300-20-200 MG-MG-UNIT CHEW Chew 1 tablet daily by mouth.    Cholecalciferol (VITAMIN D3) 5000 units TABS Take 2 tablets by mouth daily.    clonazePAM (KLONOPIN) 0.5 MG tablet TAKE 1 TABLET BY MOUTH EVERY MORNING AND 1/2 TABLET AT NOON AND 1/2 TABLET AT NIGHT   hydroxypropyl methylcellulose / hypromellose (ISOPTO TEARS / GONIOVISC) 2.5 % ophthalmic solution Place into both eyes daily as needed for dry eyes.   Magnesium 250 MG TABS Take 250 mg by mouth 2 (two) times a day.   olmesartan (BENICAR) 5 MG tablet Take 5 mg by mouth  daily as needed (if blood pressure is too high). For blood pressure only if 150/90+   propranolol (INDERAL) 40 MG tablet TAKE 1 TABLET BY MOUTH FOUR TIMES DAILY WITH MEALS AND BEDTIME FOR BLOOD PRESSURE AND TREMOR   topiramate (TOPAMAX) 25 MG tablet TAKE 1 TABLET(25 MG) BY MOUTH AT BEDTIME   vitamin B-12 (CYANOCOBALAMIN) 500 MCG tablet Take 500 mcg by mouth daily. Sublingual   No current facility-administered medications on file prior to visit.   Allergies  Allergen Reactions   Codeine Hives   Hctz [Hydrochlorothiazide] Other (See  Comments)    Low sodium   Meloxicam Nausea Only    Gastritis and nausea   Mysoline [Primidone]    Pneumovax 23 [Pneumococcal Vac Polyvalent]     Red, raised area.  Knot at site of injection   Toradol [Ketorolac Tromethamine] Swelling    Localized redness at injection site.     Ace Inhibitors Cough   PMHx:   Past Medical History:  Diagnosis Date   C. difficile colitis    Cancer ([place])    Endometrial cancer   Essential and other specified forms of tremor    Osteoporosis 11/2019   T score -2.8 stable from prior DEXA   Personal history of venous thrombosis and embolism 1997   Pulmonary embolus ([place])    Shingles    Stroke ([place])    Thrush    TIA (transient ischemic attack)    Immunization History  Administered Date(s) Administered   H1N1 01/03/2009   Influenza Whole 09/23/2013   Influenza, High Dose Seasonal PF 09/06/2014, 08/23/2015, 08/21/2016, 08/28/2017, 09/01/2018, 09/14/2019, 09/19/2020   PFIZER(Purple Top)SARS-COV-2 Vaccination 12/29/2019, 01/16/2020, 09/06/2020   PPD Test 03/29/2014   Pneumococcal Polysaccharide-23 03/29/2014   Pneumococcal-Unspecified 12/09/2002   Td 03/23/2013   Zoster, Live 12/09/2006   Past Surgical History:  Procedure Laterality Date   APPENDECTOMY  1961   cataract surg Bilateral 2021   Dr. Gershon Crane, R eye 8/18, L 8/25   CESAREAN SECTION  1961   TONSILLECTOMY AND ADENOIDECTOMY     TOTAL ABDOMINAL HYSTERECTOMY W/ BILATERAL SALPINGOOPHORECTOMY  1997   FHx:    Reviewed / unchanged  SHx:    Reviewed / unchanged  Systems Review:  Constitutional: Denies fever, chills, wt changes, headaches, insomnia, fatigue, night sweats, change in appetite. Eyes: Denies redness, blurred vision, diplopia, discharge, itchy, watery eyes.  ENT: Denies discharge, congestion, post nasal drip, epistaxis, sore throat, earache, hearing loss, dental pain, tinnitus, vertigo, sinus pain, snoring.  CV: Denies chest pain, palpitations, irregular heartbeat, syncope, dyspnea,  diaphoresis, orthopnea, PND, claudication or edema. Respiratory: denies cough, dyspnea, DOE, pleurisy, hoarseness, laryngitis, wheezing.  Gastrointestinal: Denies dysphagia, odynophagia, heartburn, reflux, water brash, abdominal pain or cramps, nausea, vomiting, bloating, diarrhea, constipation, hematemesis, melena, hematochezia  or hemorrhoids. Genitourinary: Denies dysuria, frequency, urgency, nocturia, hesitancy, discharge, hematuria or flank pain. Musculoskeletal: Denies arthralgias, myalgias, stiffness, jt. swelling, pain, limping or strain/sprain.  Skin: Denies pruritus, rash, hives, warts, acne, eczema or change in skin lesion(s). Neuro: No weakness, tremor, incoordination, spasms, paresthesia or pain. Psychiatric: Denies confusion, memory loss or sensory loss. Endo: Denies change in weight, skin or hair change.  Heme/Lymph: No excessive bleeding, bruising or enlarged lymph nodes.  Physical Exam  BP 133/77   Pulse 85   Temp (!) 97.3 F (36.3 C)   Resp 16   Ht 5\' 4"  (1.626 m)   Wt 147 lb 11.2 oz (67 kg)   SpO2 92%   BMI 25.35 kg/m   Appears well nourished,  well groomed  and in no distress.  Eyes: PERRLA, EOMs, conjunctiva no swelling or erythema. Sinuses: No frontal/maxillary tenderness ENT/Mouth: EAC's clear, TM's nl w/o erythema, bulging. Nares clear w/o erythema, swelling, exudates. Oropharynx clear without erythema or exudates. Oral hygiene is good. Tongue normal, non obstructing. Hearing intact.  Neck: Supple. Thyroid nl. Car 2+/2+ without bruits, nodes or JVD. Chest: Respirations nl with BS clear & equal w/o rales, rhonchi, wheezing or stridor.  Cor: Heart sounds normal w/ regular rate and rhythm without sig. murmurs, gallops, clicks or rubs. Peripheral pulses normal and equal  without edema.  Abdomen: Soft & bowel sounds normal. Non-tender w/o guarding, rebound, hernias, masses or organomegaly.  Lymphatics: Unremarkable.  Musculoskeletal: Full ROM all peripheral  extremities, joint stability, 5/5 strength and normal gait.  Skin: Warm, dry without exposed rashes, lesions or ecchymosis apparent.  Neuro: Cranial nerves intact, reflexes equal bilaterally. Sensory-motor testing grossly intact. Tendon reflexes grossly intact.  Pysch: Alert & oriented x 3.  Insight and judgement nl & appropriate. No ideations.  Assessment and Plan:  1. Saddle embolus of pulmonary artery (HCC)  - Lupus Anticoagulant and Antiphospholipid Confirmation w/Consultation  2. PSVT (paroxysmal supraventricular tachycardia) (HCC)  - COMPLETE METABOLIC PANEL WITH GFR  3. Chronic kidney disease, stage 3b (HCC)  - COMPLETE METABOLIC PANEL WITH GFR  4. History of transient ischemic attack (TIA)  5. Essential tremor  6. Hyperlipidemia, mixed  - Continue diet/meds, exercise,& lifestyle modifications.  - Continue monitor periodic cholesterol/liver & renal functions   7. Essential hypertension  - Continue medication, monitor blood pressure at home.  - Continue DASH diet.  Reminder to go to the ER if any CP,  SOB, nausea, dizziness, severe HA, changes vision/speech.   - CBC with Differential/Platelet - COMPLETE METABOLIC PANEL WITH GFR  8. Complete right bundle branch block (RBBB)  9. Long term current use of anticoagulant  - COMPLETE METABOLIC PANEL WITH GFR  10. Medication management  - CBC with Differential/Platelet  11. Hypercoagulable state ([place])  - check coagulation parameters:  - Factor 5 Mutation Leiden - Cardiolipin antibody - Lupus Anticoagulant and Antiphospholipid Confirmation w/Consultation  12. Deep vein thrombosis (DVT)  - not identified ([place])  - Pending results of coag studies anticipate Hematology referral for guidance in determining time frame for treatment of her bilateral  PE's.        Discussed  regular exercise, BP monitoring, weight control to achieve/maintain BMI less than 25 and discussed meds and SE's. Recommended labs to assess and  monitor clinical status with further disposition pending results of labs. Over 30 minutes of exam, counseling, chart review was performed.   Kirtland Bouchard, MD

## 2021-06-21 ENCOUNTER — Ambulatory Visit: Payer: Medicare PPO

## 2021-06-26 ENCOUNTER — Other Ambulatory Visit: Payer: Self-pay | Admitting: Internal Medicine

## 2021-06-26 DIAGNOSIS — D689 Coagulation defect, unspecified: Secondary | ICD-10-CM

## 2021-06-26 DIAGNOSIS — I2699 Other pulmonary embolism without acute cor pulmonale: Secondary | ICD-10-CM

## 2021-06-27 ENCOUNTER — Telehealth: Payer: Self-pay | Admitting: Oncology

## 2021-06-27 NOTE — Telephone Encounter (Signed)
Received a new hem referral from Dr. Melford Aase for bilateral pulmonary. [name] returned my call and has been scheduled on 7/21 at 2pm to see Dr. Alen Blew.

## 2021-06-27 NOTE — Progress Notes (Signed)
PATIENT IS AWARE OF LAB RESULTS AND HAS AN APPT. TOMORROW WITH HEMATOLOGIST. Monica Curry [facility]

## 2021-06-28 ENCOUNTER — Other Ambulatory Visit: Payer: Self-pay

## 2021-06-28 ENCOUNTER — Inpatient Hospital Stay: Payer: Medicare PPO | Attending: Oncology | Admitting: Oncology

## 2021-06-28 VITALS — BP 151/76 | HR 77 | Temp 98.4°F | Resp 18 | Ht 64.0 in | Wt 148.4 lb

## 2021-06-28 DIAGNOSIS — Z86718 Personal history of other venous thrombosis and embolism: Secondary | ICD-10-CM | POA: Insufficient documentation

## 2021-06-28 DIAGNOSIS — Z803 Family history of malignant neoplasm of breast: Secondary | ICD-10-CM | POA: Diagnosis not present

## 2021-06-28 DIAGNOSIS — I2699 Other pulmonary embolism without acute cor pulmonale: Secondary | ICD-10-CM | POA: Insufficient documentation

## 2021-06-28 DIAGNOSIS — Z808 Family history of malignant neoplasm of other organs or systems: Secondary | ICD-10-CM

## 2021-06-28 DIAGNOSIS — Z8042 Family history of malignant neoplasm of prostate: Secondary | ICD-10-CM

## 2021-06-28 DIAGNOSIS — Z9049 Acquired absence of other specified parts of digestive tract: Secondary | ICD-10-CM

## 2021-06-28 DIAGNOSIS — Z7901 Long term (current) use of anticoagulants: Secondary | ICD-10-CM

## 2021-06-28 DIAGNOSIS — R760 Raised antibody titer: Secondary | ICD-10-CM | POA: Diagnosis not present

## 2021-06-28 NOTE — Progress Notes (Signed)
Reason for the request:    Pulmonary embolism  HPI: I was asked by Dr. Melford Aase to evaluate Monica Curry for the evaluation of pulmonary embolism.  She is an 87 year old woman with history of TIA and remote history of PE over 25 years ago hospitalized on July 7 after presenting with symptoms of shortness of breath.  Her evaluation at that time showed acute pulmonary embolus with moderate clot burden and evidence of right heart strain.  Bilateral lower extremity Doppler on June 15, 2021 showed no evidence of deep vein thrombosis in lower extremity bilaterally.  She was started on heparin and discharged on Eliquis.  Hypercoagulable panel obtained on June 19, 2021 showed normal anticardiolipin antibody and beta-2 glycoprotein.  She had a weak positive lupus anticoagulant although while she is on Eliquis.     She denies any clear-cut provoking symptoms leading up to her diagnosis.  She denies any prolonged immobilization, surgery or recent travel.  Clinically, she reports feeling reasonably well without any major complaints.  She does report some chest tightness but no shortness of breath or difficulty breathing.  She denies any leg edema or bleeding complications.   She does not report any headaches, blurry vision, syncope or seizures. Does not report any fevers, chills or sweats.  Does not report any cough, wheezing or hemoptysis.  Does not report any chest pain, palpitation, orthopnea or leg edema.  Does not report any nausea, vomiting or abdominal pain.  Does not report any constipation or diarrhea.  Does not report any skeletal complaints.    Does not report frequency, urgency or hematuria.  Does not report any skin rashes or lesions. Does not report any heat or cold intolerance.  Does not report any lymphadenopathy or petechiae.  Does not report any anxiety or depression.  Remaining review of systems is negative.     Past Medical History:  Diagnosis Date   C. difficile colitis    Cancer [facility])     Endometrial cancer   Essential and other specified forms of tremor    Osteoporosis 11/2019   T score -2.8 stable from prior DEXA   Personal history of venous thrombosis and embolism 1997   Pulmonary embolus ([place])    Shingles    Stroke ([place])    Thrush    TIA (transient ischemic attack)   :   Past Surgical History:  Procedure Laterality Date   APPENDECTOMY  1961   cataract surg Bilateral 2021   Dr. Gershon Crane, R eye 8/18, L 8/25   CESAREAN SECTION  1961   TONSILLECTOMY AND ADENOIDECTOMY     TOTAL ABDOMINAL HYSTERECTOMY W/ BILATERAL SALPINGOOPHORECTOMY  1997  :   Current Outpatient Medications:    acetaminophen (TYLENOL) 500 MG tablet, Take 500 mg by mouth every 6 (six) hours as needed for moderate pain., Disp: , Rfl:    apixaban (ELIQUIS) 5 MG TABS tablet, Take 2 tablets twice a day until 06/22/2021.  Then take 1 tablet twice a day starting on 06/23/2021., Disp: 60 tablet, Rfl: 3   aspirin EC 81 MG tablet, Take 1 tablet (81 mg total) by mouth daily. Swallow whole., Disp: 90 tablet, Rfl: 3   atorvastatin (LIPITOR) 10 MG tablet, Take 1 tablet Daily for Cholesterol, Disp: 90 tablet, Rfl: 3   Calcium-Magnesium-Vitamin D 300-20-200 MG-MG-UNIT CHEW, Chew 1 tablet daily by mouth. , Disp: , Rfl:    Cholecalciferol (VITAMIN D3) 5000 units TABS, Take 2 tablets by mouth daily. , Disp: , Rfl:    clonazePAM (KLONOPIN)  0.5 MG tablet, TAKE 1 TABLET BY MOUTH EVERY MORNING AND 1/2 TABLET AT NOON AND 1/2 TABLET AT NIGHT, Disp: 60 tablet, Rfl: 1   hydroxypropyl methylcellulose / hypromellose (ISOPTO TEARS / GONIOVISC) 2.5 % ophthalmic solution, Place into both eyes daily as needed for dry eyes., Disp: , Rfl:    Magnesium 250 MG TABS, Take 250 mg by mouth 2 (two) times a day., Disp: , Rfl:    olmesartan (BENICAR) 5 MG tablet, Take 5 mg by mouth daily as needed (if blood pressure is too high). For blood pressure only if 150/90+, Disp: , Rfl:    propranolol (INDERAL) 40 MG tablet, TAKE 1 TABLET BY MOUTH FOUR  TIMES DAILY WITH MEALS AND BEDTIME FOR BLOOD PRESSURE AND TREMOR, Disp: 360 tablet, Rfl: 3   topiramate (TOPAMAX) 25 MG tablet, TAKE 1 TABLET(25 MG) BY MOUTH AT BEDTIME, Disp: 30 tablet, Rfl: 5   vitamin B-12 (CYANOCOBALAMIN) 500 MCG tablet, Take 500 mcg by mouth daily. Sublingual, Disp: , Rfl: :   Allergies  Allergen Reactions   Codeine Hives   Hctz [Hydrochlorothiazide] Other (See Comments)    Low sodium   Meloxicam Nausea Only    Gastritis and nausea   Mysoline [Primidone]    Pneumovax 23 [Pneumococcal Vac Polyvalent]     Red, raised area.  Knot at site of injection   Toradol [Ketorolac Tromethamine] Swelling    Localized redness at injection site.     Ace Inhibitors Cough  :   Family History  Problem Relation Age of Onset   Cancer Mother        Unknown type   Dementia Mother    Cancer Father        Bone   Heart disease Brother    Prostate cancer Brother        Prostate   Heart disease Brother    Kidney failure Brother    Prostate cancer Brother        Prostate   Heart disease Sister    Blindness Sister 65       temporal arteritis   Skin cancer Sister        basal and melanoma   Heart disease Sister    Kidney failure Sister        Kidney transplant   Diabetes Sister    Hypertension Sister    Heart disease Brother   :   Social History   Socioeconomic History   Marital status: Widowed    Spouse name: Not on file   Number of children: 1   Years of education: Not on file   Highest education level: Not on file  Occupational History   Not on file  Tobacco Use   Smoking status: Never   Smokeless tobacco: Never  Vaping Use   Vaping Use: Never used  Substance and Sexual Activity   Alcohol use: Not Currently    Alcohol/week: 0.0 standard drinks   Drug use: No   Sexual activity: Not Currently    Birth control/protection: Post-menopausal, Surgical    Comment: HYST-1st intercourse 87 yo-Fewer than 5 partners  Other Topics Concern   Not on file  Social  History Narrative   Not on file   Social Determinants of Health   Financial Resource Strain: Not on file  Food Insecurity: Not on file  Transportation Needs: Not on file  Physical Activity: Not on file  Stress: Not on file  Social Connections: Not on file  Intimate Partner Violence: Not on file  :  Pertinent items are noted in HPI.  Exam: Blood pressure (!) 151/76, pulse 77, temperature 98.4 F (36.9 C), temperature source Oral, resp. rate 18, height 5\' 4"  (1.626 m), weight 148 lb 6.4 oz (67.3 kg), SpO2 99 %. ECOG 1 General appearance: alert and cooperative appeared without distress. Head: atraumatic without any abnormalities. Eyes: conjunctivae/corneas clear. PERRL.  Sclera anicteric. Throat: lips, mucosa, and tongue normal; without oral thrush or ulcers. Resp: clear to auscultation bilaterally without rhonchi, wheezes or dullness to percussion. Cardio: regular rate and rhythm, S1, S2 normal, no murmur, click, rub or gallop GI: soft, non-tender; bowel sounds normal; no masses,  no organomegaly Skin: Skin color, texture, turgor normal. No rashes or lesions Lymph nodes: Cervical, supraclavicular, and axillary nodes normal. Neurologic: Grossly normal without any motor, sensory or deep tendon reflexes. Musculoskeletal: No joint deformity or effusion.      CT Angio Chest PE W and/or Wo Contrast  Result Date: 06/14/2021 CLINICAL DATA:  Shortness of breath and left-sided chest pain. EXAM: CT ANGIOGRAPHY CHEST WITH CONTRAST TECHNIQUE: Multidetector CT imaging of the chest was performed using the standard protocol during bolus administration of intravenous contrast. Multiplanar CT image reconstructions and MIPs were obtained to evaluate the vascular anatomy. CONTRAST:  76mL OMNIPAQUE IOHEXOL 350 MG/ML SOLN COMPARISON:  Radiograph earlier today. FINDINGS: Cardiovascular: Examination is positive for acute pulmonary embolus. There are filling defects within the right and left pulmonary  arterial system. On the right this involves the distal main pulmonary artery extending into all lobar and many segmental branches. On the left this involves the left lower lobe are branches, near occlusive, extending into the segmental and subsegmental branches. There is subsegmental left upper lobe involvement. Thromboembolic burden is moderate. There is right heart strain with RV to LV ratio of 1.2. Upper normal heart size. No pericardial effusion. Moderate aortic atherosclerosis with tortuosity. Conventional branching pattern from the aortic arch. No aortic dissection. Mediastinum/Nodes: No enlarged mediastinal or hilar lymph nodes. No esophageal wall thickening. Heterogeneously enlarged right lobe of the thyroid gland without definite dominant nodule. Lungs/Pleura: Small left greater than right pleural effusion with associated atelectasis. No evidence of pulmonary infarct. No pulmonary edema. No pulmonary mass. Motion artifact limits detailed assessment of the basis. Upper Abdomen: Motion obscured, bilateral renal cysts. Musculoskeletal: Exaggerated thoracic kyphosis with multilevel degenerative change. There are no acute or suspicious osseous abnormalities. Unremarkable chest wall soft tissues. Review of the MIP images confirms the above findings. IMPRESSION: 1. Examination is positive for acute pulmonary embolus with moderate clot burden and evidence of right heart strain, RV to LV ratio of 1.2. 2. Small left greater than right pleural effusions with associated atelectasis. Aortic Atherosclerosis (ICD10-I70.0). Critical Value/emergent results were called by telephone at the time of interpretation on 06/14/2021 at 5:54 pm to Dr Carmin Muskrat , who verbally acknowledged these results. Electronically Signed   By: Keith Rake M.D.   On: 06/14/2021 17:55     Assessment and Plan:   87 year old woman with:  1.  Acute pulmonary embolus with moderate clot burden and right heart strain diagnosed in July  2022.  She had previous thrombosis episode in the past around her hysterectomy although this current thrombosis appears to be unprovoked.  Treatment options moving forward were discussed given her potentially positive lupus anticoagulant were discussed.  Risks and benefits of long-term anticoagulation were reviewed.  Given the clot burden and the potential thrombophilia risk associated with lupus anticoagulant, I recommended long-term anticoagulation.  Duration of at least 6 months and  potentially longer extending to lifetime anticoagulation is likely recommended.  If there is contraindication to anticoagulation after 6 months, discontinuation can be considered.  If she develops any bleeding complications then that duration could be revisited in the future.   After discussion today, she understands and agrees for long-term anticoagulation potentially lifetime.  2.  Follow-up: I am happy to see her in the future as needed.  45  minutes were dedicated to this visit. The time was spent on reviewing laboratory data, imaging studies, discussing treatment options, and answering questions regarding future plan.     A copy of this consult has been forwarded to the requesting physician.

## 2021-07-04 ENCOUNTER — Encounter: Payer: Medicare PPO | Admitting: Adult Health

## 2021-07-04 ENCOUNTER — Other Ambulatory Visit: Payer: Self-pay | Admitting: Internal Medicine

## 2021-07-04 MED ORDER — APIXABAN 5 MG PO TABS: ORAL_TABLET | ORAL | 3 refills | Status: DC

## 2021-07-07 ENCOUNTER — Emergency Department (HOSPITAL_COMMUNITY): Payer: Medicare PPO

## 2021-07-07 ENCOUNTER — Other Ambulatory Visit: Payer: Self-pay

## 2021-07-07 ENCOUNTER — Emergency Department (HOSPITAL_COMMUNITY)
Admission: EM | Admit: 2021-07-07 | Discharge: 2021-07-07 | Disposition: A | Discharge status: Home / Self Care | Payer: Medicare PPO | Attending: Emergency Medicine | Admitting: Emergency Medicine

## 2021-07-07 DIAGNOSIS — Z8542 Personal history of malignant neoplasm of other parts of uterus: Secondary | ICD-10-CM | POA: Insufficient documentation

## 2021-07-07 DIAGNOSIS — Z7982 Long term (current) use of aspirin: Secondary | ICD-10-CM | POA: Insufficient documentation

## 2021-07-07 DIAGNOSIS — I129 Hypertensive chronic kidney disease with stage 1 through stage 4 chronic kidney disease, or unspecified chronic kidney disease: Secondary | ICD-10-CM | POA: Diagnosis not present

## 2021-07-07 DIAGNOSIS — Z7901 Long term (current) use of anticoagulants: Secondary | ICD-10-CM | POA: Diagnosis not present

## 2021-07-07 DIAGNOSIS — R202 Paresthesia of skin: Secondary | ICD-10-CM | POA: Diagnosis not present

## 2021-07-07 DIAGNOSIS — I10 Essential (primary) hypertension: Secondary | ICD-10-CM | POA: Diagnosis not present

## 2021-07-07 DIAGNOSIS — N1832 Chronic kidney disease, stage 3b: Secondary | ICD-10-CM | POA: Diagnosis not present

## 2021-07-07 DIAGNOSIS — R200 Anesthesia of skin: Secondary | ICD-10-CM | POA: Insufficient documentation

## 2021-07-07 DIAGNOSIS — Z9889 Other specified postprocedural states: Secondary | ICD-10-CM | POA: Diagnosis not present

## 2021-07-07 DIAGNOSIS — Z85828 Personal history of other malignant neoplasm of skin: Secondary | ICD-10-CM | POA: Insufficient documentation

## 2021-07-07 DIAGNOSIS — R531 Weakness: Secondary | ICD-10-CM | POA: Diagnosis not present

## 2021-07-07 DIAGNOSIS — R29810 Facial weakness: Secondary | ICD-10-CM | POA: Diagnosis not present

## 2021-07-07 DIAGNOSIS — Z20822 Contact with and (suspected) exposure to covid-19: Secondary | ICD-10-CM | POA: Insufficient documentation

## 2021-07-07 DIAGNOSIS — I4510 Unspecified right bundle-branch block: Secondary | ICD-10-CM | POA: Diagnosis not present

## 2021-07-07 DIAGNOSIS — Z79899 Other long term (current) drug therapy: Secondary | ICD-10-CM | POA: Insufficient documentation

## 2021-07-07 DIAGNOSIS — I771 Stricture of artery: Secondary | ICD-10-CM | POA: Diagnosis not present

## 2021-07-07 LAB — PROTIME-INR
INR: 1.6 — ABNORMAL HIGH (ref 0.8–1.2)
Prothrombin Time: 18.6 seconds — ABNORMAL HIGH (ref 11.4–15.2)

## 2021-07-07 LAB — URINALYSIS, ROUTINE W REFLEX MICROSCOPIC
Bacteria, UA: NONE SEEN
Bilirubin Urine: NEGATIVE
Glucose, UA: NEGATIVE mg/dL
Hgb urine dipstick: NEGATIVE
Ketones, ur: NEGATIVE mg/dL
Nitrite: NEGATIVE
Protein, ur: NEGATIVE mg/dL
Specific Gravity, Urine: 1.002 — ABNORMAL LOW (ref 1.005–1.030)
pH: 7 (ref 5.0–8.0)

## 2021-07-07 LAB — DIFFERENTIAL
Abs Immature Granulocytes: 0.02 10*3/uL (ref 0.00–0.07)
Basophils Absolute: 0.1 10*3/uL (ref 0.0–0.1)
Basophils Relative: 1 %
Eosinophils Absolute: 0.2 10*3/uL (ref 0.0–0.5)
Eosinophils Relative: 3 %
Immature Granulocytes: 0 %
Lymphocytes Relative: 38 %
Lymphs Abs: 2.4 10*3/uL (ref 0.7–4.0)
Monocytes Absolute: 0.6 10*3/uL (ref 0.1–1.0)
Monocytes Relative: 10 %
Neutro Abs: 3 10*3/uL (ref 1.7–7.7)
Neutrophils Relative %: 48 %

## 2021-07-07 LAB — RESP PANEL BY RT-PCR (FLU A&B, COVID) ARPGX2
Influenza A by PCR: NEGATIVE
Influenza B by PCR: NEGATIVE
SARS Coronavirus 2 by RT PCR: NEGATIVE

## 2021-07-07 LAB — COMPREHENSIVE METABOLIC PANEL
ALT: 12 U/L (ref 0–44)
AST: 14 U/L — ABNORMAL LOW (ref 15–41)
Albumin: 3.6 g/dL (ref 3.5–5.0)
Alkaline Phosphatase: 30 U/L — ABNORMAL LOW (ref 38–126)
Anion gap: 7 (ref 5–15)
BUN: 21 mg/dL (ref 8–23)
CO2: 24 mmol/L (ref 22–32)
Calcium: 9.3 mg/dL (ref 8.9–10.3)
Chloride: 106 mmol/L (ref 98–111)
Creatinine, Ser: 1.33 mg/dL — ABNORMAL HIGH (ref 0.44–1.00)
GFR, Estimated: 39 mL/min — ABNORMAL LOW (ref >60)
Glucose, Bld: 101 mg/dL — ABNORMAL HIGH (ref 70–99)
Potassium: 4.8 mmol/L (ref 3.5–5.1)
Sodium: 137 mmol/L (ref 135–145)
Total Bilirubin: 0.8 mg/dL (ref 0.3–1.2)
Total Protein: 6.1 g/dL — ABNORMAL LOW (ref 6.5–8.1)

## 2021-07-07 LAB — RAPID URINE DRUG SCREEN, HOSP PERFORMED
Amphetamines: NOT DETECTED
Barbiturates: NOT DETECTED
Benzodiazepines: NOT DETECTED
Cocaine: NOT DETECTED
Opiates: NOT DETECTED
Tetrahydrocannabinol: NOT DETECTED

## 2021-07-07 LAB — I-STAT CHEM 8, ED
BUN: 23 mg/dL (ref 8–23)
Calcium, Ion: 1.23 mmol/L (ref 1.15–1.40)
Chloride: 107 mmol/L (ref 98–111)
Creatinine, Ser: 1.3 mg/dL — ABNORMAL HIGH (ref 0.44–1.00)
Glucose, Bld: 96 mg/dL (ref 70–99)
HCT: 44 % (ref 36.0–46.0)
Hemoglobin: 15 g/dL (ref 12.0–15.0)
Potassium: 4.8 mmol/L (ref 3.5–5.1)
Sodium: 139 mmol/L (ref 135–145)
TCO2: 23 mmol/L (ref 22–32)

## 2021-07-07 LAB — ETHANOL: Alcohol, Ethyl (B): <10 mg/dL (ref <10)

## 2021-07-07 LAB — APTT: aPTT: 35 seconds (ref 24–36)

## 2021-07-07 LAB — CBC
HCT: 45.6 % (ref 36.0–46.0)
Hemoglobin: 14.3 g/dL (ref 12.0–15.0)
MCH: 28.3 pg (ref 26.0–34.0)
MCHC: 31.4 g/dL (ref 30.0–36.0)
MCV: 90.3 fL (ref 80.0–100.0)
Platelets: 313 10*3/uL (ref 150–400)
RBC: 5.05 MIL/uL (ref 3.87–5.11)
RDW: 14.6 % (ref 11.5–15.5)
WBC: 6.2 10*3/uL (ref 4.0–10.5)
nRBC: 0 % (ref 0.0–0.2)

## 2021-07-07 MED ORDER — CLONAZEPAM 0.5 MG PO TABS
0.5000 mg | ORAL_TABLET | Freq: Once | ORAL | Status: AC
  Administered 2021-07-07: 0.5 mg via ORAL
  Filled 2021-07-07: qty 1

## 2021-07-07 MED ORDER — PROPRANOLOL HCL 40 MG PO TABS
40.0000 mg | ORAL_TABLET | Freq: Once | ORAL | Status: AC
  Administered 2021-07-07: 40 mg via ORAL
  Filled 2021-07-07: qty 1

## 2021-07-07 MED ORDER — AMLODIPINE BESYLATE 5 MG PO TABS
5.0000 mg | ORAL_TABLET | Freq: Once | ORAL | Status: AC
  Administered 2021-07-07: 5 mg via ORAL
  Filled 2021-07-07: qty 1

## 2021-07-07 NOTE — ED Notes (Signed)
Patient transported to MRI 

## 2021-07-07 NOTE — ED Notes (Signed)
Patient discharge instructions reviewed with the patient. The patient verbalized understanding of instructions. Patient discharged. 

## 2021-07-07 NOTE — ED Provider Notes (Signed)
  Physical Exam  BP (!) 160/65   Pulse (!) 56   Temp 98.2 F (36.8 C) (Oral)   Resp 17   Ht '5\' 4"'$  (1.626 m)   Wt 67.3 kg   SpO2 94%   BMI 25.47 kg/m   Physical Exam  ED Course/Procedures     Procedures  MDM    Received care of patient from Dr. Ayesha Rumpf at 7 AM.  Please see her note for prior history, physical and care.  Briefly this is an 87 year old female that presented with tingling to the left arm and leg that started 8 AM Friday morning.  Labs and CT show no significant findings.  MRI is pending.   MRI shows no acute abnormalities.  No neck pain.  Normal pulses bilateral upper and lower extremities.  Unclear etiology of left sided tingling of arm and leg but do not see emergent pathology requiring admission at this time and recommend outpatient Neurology evaluation and PCP evaluation regarding blood pressures. Patient discharged in stable condition with understanding of reasons to return.       Gareth Morgan, MD 07/07/21 [phone]

## 2021-07-07 NOTE — ED Provider Notes (Signed)
[facility] EMERGENCY DEPARTMENT Provider Note   CSN: [id number] Arrival date & time: 07/07/21  0129     History Chief Complaint  Patient presents with   Tingling    Monica Curry is a 87 y.o. female.  The history is provided by the patient, medical records and the EMS personnel.  Monica Curry is a 87 y.o. female who presents to the Emergency Department complaining of numbness.  She presents to the ED by EMS for evaluation of tingling to left arm and leg that started at 8 am Friday morning.  Last known well at 11pm Thursday night.  Sxs were mild throughout the day, but when she took her shoes off she noticed increased tingling in the area.  She took her blood pressure and noticed it was elevated and called 911.  She is on Eliquis for hx/o PE.  She is compliant with her home meds and took them this evening.  No recent illnesses. No prior similar sxs.      Past Medical History:  Diagnosis Date   C. difficile colitis    Cancer [facility])    Endometrial cancer   Essential and other specified forms of tremor    Osteoporosis 11/2019   T score -2.8 stable from prior DEXA   Personal history of venous thrombosis and embolism 1997   Pulmonary embolus ([place])    Shingles    Stroke ([place])    Thrush    TIA (transient ischemic attack)     Patient Active Problem List   Diagnosis Date Noted   Aortic atherosclerosis ([place]) - CT 06/2021 06/15/2021   Pulmonary embolism ([place]) 06/14/2021   Bilateral renal cysts 07/06/2020   Paroxysmal supraventricular tachycardia ([place]) 10/20/2019   History of skin cancer (basal cell) 06/16/2019   Complete right bundle branch block 06/16/2019   B12 deficiency 06/15/2019   History of TIA (transient ischemic attack) 10/23/2017   Chronic kidney disease, stage 3b ([place]) 07/21/2015   Medication management 07/04/2014   Essential hypertension 11/25/2013   Hyperlipidemia, mixed 11/25/2013   Abnormal glucose 11/25/2013   Vitamin D deficiency  11/25/2013   Essential tremor 11/25/2013   History of endometrial cancer    Hx/o DVT/PE    Urethral prolapse    Osteoporosis 10/10/2011    Past Surgical History:  Procedure Laterality Date   APPENDECTOMY  1961   cataract surg Bilateral 2021   Dr. Gershon Crane, R eye 8/18, L 8/25   CESAREAN SECTION  1961   TONSILLECTOMY AND ADENOIDECTOMY     TOTAL ABDOMINAL HYSTERECTOMY W/ BILATERAL SALPINGOOPHORECTOMY  1997     OB History     Gravida  2   Para  2   Term  2   Preterm      AB      Living  1      SAB      IAB      Ectopic      Multiple      Live Births              Family History  Problem Relation Age of Onset   Cancer Mother        Unknown type   Dementia Mother    Cancer Father        Bone   Heart disease Brother    Prostate cancer Brother        Prostate   Heart disease Brother    Kidney failure Brother    Prostate cancer Brother  Prostate   Heart disease Sister    Blindness Sister 89       temporal arteritis   Skin cancer Sister        basal and melanoma   Heart disease Sister    Kidney failure Sister        Kidney transplant   Diabetes Sister    Hypertension Sister    Heart disease Brother     Social History   Tobacco Use   Smoking status: Never   Smokeless tobacco: Never  Vaping Use   Vaping Use: Never used  Substance Use Topics   Alcohol use: Not Currently    Alcohol/week: 0.0 standard drinks   Drug use: No    Home Medications Prior to Admission medications   Medication Sig Start Date End Date Taking? Authorizing Provider  acetaminophen (TYLENOL) 500 MG tablet Take 500 mg by mouth every 6 (six) hours as needed for moderate pain.    [provider]  apixaban (ELIQUIS) 5 MG TABS tablet Take  1 tablet 2 x/ day to Prevent Blood Clots 07/04/21   Unk Pinto, MD  aspirin EC 81 MG tablet Take 1 tablet (81 mg total) by mouth daily. Swallow whole. 04/17/21   Fay Records, MD  atorvastatin (LIPITOR) 10 MG tablet  Take 1 tablet Daily for Cholesterol 05/28/21   Unk Pinto, MD  Calcium-Magnesium-Vitamin D 300-20-200 MG-MG-UNIT CHEW Chew 1 tablet daily by mouth.     [provider]  Cholecalciferol (VITAMIN D3) 5000 units TABS Take 2 tablets by mouth daily.     [provider]  clonazePAM (KLONOPIN) 0.5 MG tablet TAKE 1 TABLET BY MOUTH EVERY MORNING AND 1/2 TABLET AT NOON AND 1/2 TABLET AT NIGHT 06/13/21   [name], NP  hydroxypropyl methylcellulose / hypromellose (ISOPTO TEARS / GONIOVISC) 2.5 % ophthalmic solution Place into both eyes daily as needed for dry eyes. Patient not taking: Reported on 06/28/2021    [provider]  Magnesium 250 MG TABS Take 250 mg by mouth 2 (two) times a day.    [provider]  olmesartan (BENICAR) 5 MG tablet Take 5 mg by mouth daily as needed (if blood pressure is too high). For blood pressure only if 150/90+    [provider]  propranolol (INDERAL) 40 MG tablet TAKE 1 TABLET BY MOUTH FOUR TIMES DAILY WITH MEALS AND BEDTIME FOR BLOOD PRESSURE AND TREMOR 02/15/21   [name], NP  topiramate (TOPAMAX) 25 MG tablet TAKE 1 TABLET(25 MG) BY MOUTH AT BEDTIME 03/12/21   [name], NP  vitamin B-12 (CYANOCOBALAMIN) 500 MCG tablet Take 500 mcg by mouth daily. Sublingual    [provider]    Allergies    Codeine, Hctz [hydrochlorothiazide], Meloxicam, Mysoline [primidone], Pneumovax 23 [pneumococcal vac polyvalent], Toradol [ketorolac tromethamine], and Ace inhibitors  Review of Systems   Review of Systems  All other systems reviewed and are negative.  Physical Exam Updated Vital Signs BP (!) 174/70   Pulse 66   Temp 98.2 F (36.8 C) (Oral)   Resp 16   Ht '5\' 4"'$  (1.626 m)   Wt 67.3 kg   SpO2 95%   BMI 25.47 kg/m   Physical Exam Vitals and nursing note reviewed.  Constitutional:      Appearance: She is well-developed.  HENT:     Head: Normocephalic and atraumatic.  Cardiovascular:     Rate and  Rhythm: Normal rate and regular rhythm.     Heart sounds: No murmur heard.  Pulmonary:     Effort: Pulmonary effort is normal. No respiratory distress.     Breath sounds: Normal breath sounds.  Abdominal:     Palpations: Abdomen is soft.     Tenderness: There is no abdominal tenderness. There is no guarding or rebound.  Musculoskeletal:        General: No swelling or tenderness.  Skin:    General: Skin is warm and dry.  Neurological:     Mental Status: She is alert and oriented to person, place, and time.     Comments: No asymmetry of facial movements.  5/5 strength in all four extremities with sensation to light touch intact in all four extremities.  Visual fields grossly intact.    Psychiatric:        Behavior: Behavior normal.    ED Results / Procedures / Treatments   Labs (all labs ordered are listed, but only abnormal results are displayed) Labs Reviewed  PROTIME-INR - Abnormal; Notable for the following components:      Result Value   Prothrombin Time 18.6 (*)    INR 1.6 (*)    All other components within normal limits  COMPREHENSIVE METABOLIC PANEL - Abnormal; Notable for the following components:   Glucose, Bld 101 (*)    Creatinine, Ser 1.33 (*)    Total Protein 6.1 (*)    AST 14 (*)    Alkaline Phosphatase 30 (*)    GFR, Estimated 39 (*)    All other components within normal limits  URINALYSIS, ROUTINE W REFLEX MICROSCOPIC - Abnormal; Notable for the following components:   Color, Urine COLORLESS (*)    Specific Gravity, Urine 1.002 (*)    Leukocytes,Ua SMALL (*)    All other components within normal limits  I-STAT CHEM 8, ED - Abnormal; Notable for the following components:   Creatinine, Ser 1.30 (*)    All other components within normal limits  RESP PANEL BY RT-PCR (FLU A&B, COVID) ARPGX2  ETHANOL  APTT  CBC  DIFFERENTIAL  RAPID URINE DRUG SCREEN, HOSP PERFORMED    EKG EKG Interpretation  Date/Time:  Saturday July 07 2021 01:38:37 EDT Ventricular  Rate:  67 PR Interval:  227 QRS Duration: 148 QT Interval:  450 QTC Calculation: 476 R Axis:   99 Text Interpretation: Sinus rhythm Prolonged PR interval Right bundle branch block Confirmed by Quintella Reichert [phone]) on 07/07/2021 4:04:43 AM  Radiology CT HEAD WO CONTRAST  Result Date: 07/07/2021 CLINICAL DATA:  Left arm and leg paresthesia EXAM: CT HEAD WITHOUT CONTRAST TECHNIQUE: Contiguous axial images were obtained from the base of the skull through the vertex without intravenous contrast. COMPARISON:  10/23/2017 FINDINGS: Brain: Normal anatomic configuration. Parenchymal volume loss is commensurate with the patient's age. Mild periventricular white matter changes are present likely reflecting the sequela of small vessel ischemia. No abnormal intra or extra-axial mass lesion or fluid collection. No abnormal mass effect or midline shift. No evidence of acute intracranial hemorrhage or infarct. Ventricular size is normal. Cerebellum unremarkable. Vascular: No asymmetric hyperdense vasculature at the skull base. Skull: Intact Sinuses/Orbits: Paranasal sinuses are clear. Orbits are unremarkable. Other: Mastoid air cells and middle ear cavities are clear. IMPRESSION: No acute intracranial abnormality.  Mild senescent change. Electronically Signed   By: Fidela Salisbury MD   On: 07/07/2021 04:46    Procedures Procedures   Medications Ordered in ED Medications - No data to display  ED Course  I have reviewed the triage vital signs and the nursing notes.  Pertinent labs & imaging results that were available during my care of the patient were reviewed by me and considered in my medical decision making (see chart for details).    MDM Rules/Calculators/A&P                          patient with history of PE on anticoagulation here for evaluation of numbness to the left arm and leg. No appreciable neurologic deficits on examination and CT is negative for acute abnormality. Given her persistent  symptoms will obtain MRI to further evaluate for potential stroke. Patient care transferred pending MRI.  Final Clinical Impression(s) / ED Diagnoses Final diagnoses:  None    Rx / DC Orders ED Discharge Orders     None        Quintella Reichert, MD 07/07/21 [phone]

## 2021-07-07 NOTE — ED Triage Notes (Signed)
Pt here via [facility] for eval of L sided tingling that began at approx 0800 7/29. Present throughout the day, does not notice tingling as much in foot when wearing shoes. Pt was getting ready to go to bed 2300, tingling became more present. Took BP med @ 2300, last BP 212/110 approx 0115. Stroke screen neg, no difference in sensation. Recently dx w PE in both lungs, on eliquis.

## 2021-07-10 ENCOUNTER — Other Ambulatory Visit: Payer: Self-pay

## 2021-07-10 ENCOUNTER — Encounter: Payer: Self-pay | Admitting: Adult Health

## 2021-07-10 ENCOUNTER — Ambulatory Visit: Payer: Medicare PPO | Admitting: Adult Health

## 2021-07-10 VITALS — BP 106/68 | HR 60 | Temp 97.5°F | Wt 148.0 lb

## 2021-07-10 DIAGNOSIS — I10 Essential (primary) hypertension: Secondary | ICD-10-CM | POA: Diagnosis not present

## 2021-07-10 DIAGNOSIS — R202 Paresthesia of skin: Secondary | ICD-10-CM | POA: Diagnosis not present

## 2021-07-10 NOTE — Progress Notes (Signed)
Assessment and Plan:  Monica Curry was seen today for follow-up.  Diagnoses and all orders for this visit:  Essential hypertension Well controlled BP since discharge per home log Continue propranolol, PRN olmesartan Check BP prior to propranolol, take 1/2 tab only if running lower <110/70 Discussed risks with overcontrolled BP Has cardiology follow up tomorrow  Monitor blood pressure at home; call if consistently over 150/90 Continue DASH diet.   Reminder to go to the ER if any CP, SOB, nausea, dizziness, severe HA, changes vision/speech, left arm numbness and tingling and jaw pain.  Paresthesias Normal CT/MRI per ED workup, she is actually intermittently having generalized paresthesias, question if topiramate SE Might also consider carotid US - has cardiology follow up tomorrow Has upcoming neuro follow up to discuss further, possible NCV, defer changes to them per patient preference after extended discussion  Further disposition pending results of labs. Discussed med's effects and SE's.   Over 30 minutes of exam, counseling, chart review, and critical decision making was performed.   Future Appointments  Date Time Provider [place]  07/11/2021  9:15 AM Richardson Dopp T, PA-C [facility]  07/17/2021 11:00 AM [name], MD [facility] None  08/14/2021  2:00 PM [name], NP [facility] None  10/02/2021 11:00 AM [name], NP [facility] None  01/10/2022  2:30 PM [name], NP [facility] None    ------------------------------------------------------------------------------------------------------------------   HPI BP 106/68   Pulse 60   Temp (!) 97.5 F (36.4 C)   Wt 148 lb (67.1 kg)   SpO2 99%   BMI 25.40 kg/m  87 y.o.female with recent PE treated by eliquis  (+ lupus anticoagulant, recommended for lifetime anticoagulation) presents for ED follow up for BP and altered sensation.   She presented to ED on 07/07/2021 c/o tingling of left upper and lower  extremity and hypertensive emergency. They found no appreciable deficits on exam, underwent unremarkable CT, did get MRI due to persistent unilateral upper and lower extremity paresthesias which found no acute intracranial abnormality, stable/unchanged since 2018 with moderate nonspecific cerebral white matter signal changes. She had BP up to 181/77 in ED and was recommended for follow up with PCP and neuro. She has upcoming appointment with neuro Dr. [name] on 07/17/2021. She is on eliquis and bASA. She does endorse ongoing tingling in left upper and lower extremity, but also notes intermittently will have generalized paresthesias. ? Possibly r/t topiramate. She also notes has had instability, dizziness ongoing since May 2022.   She reports since discharge BPs he been well controlled at home (104/66 - 130s/70s), today [name] is BP: 106/68. She is taking propranolol 40 mg QID for tremors, has olmesartan 5 mg that she takes PRN only if 150/90+ (rare). She denies chest pain, dizziness. She does endorse exertional dyspnea following PE. Reports has cardiology follow up tomorrow.    Past Medical History:  Diagnosis Date   C. difficile colitis    Cancer [facility])    Endometrial cancer   Essential and other specified forms of tremor    Osteoporosis 11/2019   T score -2.8 stable from prior DEXA   Personal history of venous thrombosis and embolism 1997   Pulmonary embolus (HCC)    Shingles    Stroke ([place])    Thrush    TIA (transient ischemic attack)      Allergies  Allergen Reactions   Codeine Hives   Hctz [Hydrochlorothiazide] Other (See Comments)    Low sodium   Meloxicam Nausea Only  Gastritis and nausea   Mysoline [Primidone]    Pneumovax 23 [Pneumococcal Vac Polyvalent]     Red, raised area.  Knot at site of injection   Toradol [Ketorolac Tromethamine] Swelling    Localized redness at injection site.     Ace Inhibitors Cough    Current Outpatient Medications on File Prior to Visit   Medication Sig   acetaminophen (TYLENOL) 500 MG tablet Take 500 mg by mouth every 6 (six) hours as needed for moderate pain.   apixaban (ELIQUIS) 5 MG TABS tablet Take  1 tablet 2 x/ day to Prevent Blood Clots   aspirin EC 81 MG tablet Take 1 tablet (81 mg total) by mouth daily. Swallow whole.   atorvastatin (LIPITOR) 10 MG tablet Take 1 tablet Daily for Cholesterol   Calcium-Magnesium-Vitamin D 300-20-200 MG-MG-UNIT CHEW Chew 1 tablet daily by mouth.    Cholecalciferol (VITAMIN D3) 5000 units TABS Take 2 tablets by mouth daily.    clonazePAM (KLONOPIN) 0.5 MG tablet TAKE 1 TABLET BY MOUTH EVERY MORNING AND 1/2 TABLET AT NOON AND 1/2 TABLET AT NIGHT   Magnesium 250 MG TABS Take 250 mg by mouth 2 (two) times a day.   olmesartan (BENICAR) 5 MG tablet Take 5 mg by mouth daily as needed (if blood pressure is too high). For blood pressure only if 150/90+   propranolol (INDERAL) 40 MG tablet TAKE 1 TABLET BY MOUTH FOUR TIMES DAILY WITH MEALS AND BEDTIME FOR BLOOD PRESSURE AND TREMOR   topiramate (TOPAMAX) 25 MG tablet TAKE 1 TABLET(25 MG) BY MOUTH AT BEDTIME   vitamin B-12 (CYANOCOBALAMIN) 500 MCG tablet Take 500 mcg by mouth daily. Sublingual   hydroxypropyl methylcellulose / hypromellose (ISOPTO TEARS / GONIOVISC) 2.5 % ophthalmic solution Place into both eyes daily as needed for dry eyes. (Patient not taking: No sig reported)   No current facility-administered medications on file prior to visit.    ROS: all negative except above.   Physical Exam:  BP 106/68   Pulse 60   Temp (!) 97.5 F (36.4 C)   Wt 148 lb (67.1 kg)   SpO2 99%   BMI 25.40 kg/m   General Appearance: Well nourished, well dressed elder female in no apparent distress. Eyes: PERRLA, EOMs, conjunctiva no swelling or erythema Sinuses: No Frontal/maxillary tenderness ENT/Mouth:  No erythema, swelling, or exudate on post pharynx.  Tonsils not swollen or erythematous. Hearing normal.  Neck: Supple, thyroid normal.   Respiratory: Respiratory effort normal, BS equal bilaterally without rales, rhonchi, wheezing or stridor.  Cardio: RRR with no MRGs. Brisk peripheral pulses without edema.  Abdomen: Soft, + BS.  Non tender, no guarding, rebound, hernias, masses. Lymphatics: Non tender without lymphadenopathy.  Musculoskeletal: Full ROM, 5/5 strength, normal gait.  Skin: Warm, dry without rashes, lesions, ecchymosis.  Neuro: Cranial nerves intact. Normal muscle tone, no cerebellar symptoms. Sensation intact all extremities Psych: Awake and oriented X 3, normal affect, Insight and Judgment appropriate.     Izora Ribas, NP 2:29 PM [facility]

## 2021-07-10 NOTE — Progress Notes (Addendum)
Cardiology Office Note:    Date:  [id number]   ID:  Brendolyn Patty, DOB [id number], MRN [id number]  PCP:  Unk Pinto, MD   [facility] Providers Cardiologist:  Dorris Carnes, MD     Referring MD: Unk Pinto, MD   Chief Complaint:  Hospitalization Follow-up (Admitted with pulmonary embolism)    Patient Profile:    Kizzey Armbrust is a 87 y.o. female with:  Hx of TIA with expressive aphasia Hypertension  SVT Chronic kidney disease  Hx of recurrent pulmonary embolism  +Lupus anticoagulant  Hx of DVT  Essential tremors  Hx of near syncope  Endometrial CA  Osteoporosis  Aortic atherosclerosis   Prior CV studies: Echocardiogram 06/15/21 EF 65-70, no RWMA, severe asymmetric LVH, G1 DD, normal RVSF  GATED SPECT MYO PERF W/LEXISCAN STRESS 1D 04/23/2018 Narrative  Nuclear stress EF: 76%.  There was no ST segment deviation noted during stress.  No T wave inversion was noted during stress.  The study is normal.  This is a low risk study.  The left ventricular ejection fraction is hyperdynamic (>65%). Low risk stress nuclear study with normal perfusion and normal left ventricular regional and global systolic function.    CARDIAC TELEMETRY MONITORING-INTERPRETATION ONLY 01/22/2018 Narrative 1. NSR with sinus bradycardia 2. Atrial tachycardia at 160 is present 3. Rare PVC's 4. Noise artifact   VAS US CAROTID DUPLEX BILATERAL 10/24/2017 Final Interpretation: Right Carotid: There is evidence in the right ICA of a 1-39% stenosis. Left Carotid: There is evidence in the left ICA of a 1-39% stenosis.   History of Present Illness: Ms. Haufler was lasts een by Dr. Harrington Challenger in 5/22.  She was recently admitted 7/7-7/9 with with bilateral pulmonary emboli.  Echocardiogram demonstrated normal LV and RV function.  She was started on IV heparin and transitioned to Apixaban.  A f/u hypercoagulable panel was + for Lupus anticoagulant.  She has seen Dr. Alen Blew with heme  since d/c and it is felt she will need long term anticoagulation.  She was seen in the emergency room 07/07/2021 with left upper and lower extremity tingling.  Head CT was negative for bleed.  MRI was negative for acute stroke.  Her blood pressure was elevated when she arrived and she was given amlodipine.  She returns for Cardiology f/u.  She is here alone.  Since she went to the emergency room, her blood pressures have been optimal.  She has not had chest pain.  Her breathing is improving.  She has not had syncope, orthopnea, leg edema.  She does note leg edema off and on several months prior to her pulmonary embolism.    Past Medical History:  Diagnosis Date   C. difficile colitis    Cancer [facility])    Endometrial cancer   Essential and other specified forms of tremor    Osteoporosis 11/2019   T score -2.8 stable from prior DEXA   Personal history of venous thrombosis and embolism 1997   Pulmonary embolus (HCC)    Shingles    Stroke ([place])    Thrush    TIA (transient ischemic attack)     Current Medications: Current Meds  Medication Sig   acetaminophen (TYLENOL) 500 MG tablet Take 500 mg by mouth every 6 (six) hours as needed for moderate pain.   apixaban (ELIQUIS) 5 MG TABS tablet Take  1 tablet 2 x/ day to Prevent Blood Clots   aspirin EC 81 MG tablet Take 1 tablet (81 mg total) by mouth daily. Swallow whole.  atorvastatin (LIPITOR) 10 MG tablet Take 1 tablet Daily for Cholesterol   Calcium-Magnesium-Vitamin D 300-20-200 MG-MG-UNIT CHEW Chew 1 tablet daily by mouth.    Cholecalciferol (VITAMIN D3) 5000 units TABS Take 2 tablets by mouth daily.    clonazePAM (KLONOPIN) 0.5 MG tablet TAKE 1 TABLET BY MOUTH EVERY MORNING AND 1/2 TABLET AT NOON AND 1/2 TABLET AT NIGHT   Magnesium 250 MG TABS Take 250 mg by mouth 2 (two) times a day.   olmesartan (BENICAR) 5 MG tablet Take 5 mg by mouth daily as needed (if blood pressure is too high). For blood pressure only if 150/90+   propranolol  (INDERAL) 40 MG tablet TAKE 1 TABLET BY MOUTH FOUR TIMES DAILY WITH MEALS AND BEDTIME FOR BLOOD PRESSURE AND TREMOR   topiramate (TOPAMAX) 25 MG tablet TAKE 1 TABLET(25 MG) BY MOUTH AT BEDTIME   vitamin B-12 (CYANOCOBALAMIN) 500 MCG tablet Take 500 mcg by mouth daily. Sublingual     Allergies:   Codeine, Hctz [hydrochlorothiazide], Meloxicam, Mysoline [primidone], Pneumovax 23 [pneumococcal vac polyvalent], Toradol [ketorolac tromethamine], and Ace inhibitors   Social History   Tobacco Use   Smoking status: Never   Smokeless tobacco: Never  Vaping Use   Vaping Use: Never used  Substance Use Topics   Alcohol use: Not Currently    Alcohol/week: 0.0 standard drinks   Drug use: No     Family Hx: The patient's family history includes Blindness (age of onset: 45) in her sister; Cancer in her father and mother; Dementia in her mother; Diabetes in her sister; Heart disease in her brother, brother, brother, sister, and sister; Hypertension in her sister; Kidney failure in her brother and sister; Prostate cancer in her brother and brother; Skin cancer in her sister.  Review of Systems  Gastrointestinal:  Negative for hematochezia.  Genitourinary:  Negative for hematuria.    EKGs/Labs/Other Test Reviewed:    EKG:  EKG is not ordered today.  The ekg ordered today demonstrates n/a Electrocardiogram performed in the emergency room 07/07/2021 was personally reviewed and demonstrated sinus rhythm, first-degree block, right bundle branch block, QTC 476-similar to old tracings  Recent Labs: 06/14/2021: B Natriuretic Peptide 138.3 06/15/2021: TSH 1.380 06/16/2021: Magnesium 1.8 07/07/2021: ALT 12; BUN 23; Creatinine, Ser 1.30; Hemoglobin 15.0; Platelets 313; Potassium 4.8; Sodium 139   Recent Lipid Panel Lab Results  Component Value Date/Time   CHOL 117 04/10/2021 02:26 PM   TRIG 94 04/10/2021 02:26 PM   HDL 47 (L) 04/10/2021 02:26 PM   LDLCALC 52 04/10/2021 02:26 PM      Risk  Assessment/Calculations:      Physical Exam:    VS:  BP 124/64   Pulse 68   Ht '5\' 4"'$  (1.626 m)   Wt 148 lb 6.4 oz (67.3 kg)   SpO2 98%   BMI 25.47 kg/m     Wt Readings from Last 3 Encounters:  07/11/21 148 lb 6.4 oz (67.3 kg)  07/10/21 148 lb (67.1 kg)  07/07/21 148 lb 5.9 oz (67.3 kg)     Constitutional:      Appearance: Healthy appearance. Not in distress.  Neck:     Vascular: No carotid bruit. JVD normal.  Pulmonary:     Effort: Pulmonary effort is normal.     Breath sounds: No wheezing. No rales.  Cardiovascular:     Normal rate. Regular rhythm. Normal S1. Normal S2.      Murmurs: There is no murmur.  Edema:    Peripheral edema absent.  Abdominal:     Palpations: Abdomen is soft.  Skin:    General: Skin is warm and dry.  Neurological:     Mental Status: Alert and oriented to person, place and time.     Cranial Nerves: Cranial nerves are intact.         ASSESSMENT & PLAN:    1. Essential hypertension She did have elevated blood pressures when she went to the emergency room recently with paresthesias.  However, her blood pressures been optimal since and is well controlled on exam today.  Continue current dose of olmesartan, propranolol.  2. SVT (supraventricular tachycardia) ([place]) Noted on prior monitor in 2019.  She has not had any rapid palpitations.  She has occasional sensation of skipped beats.  3. Mixed hyperlipidemia LDL optimal on most recent lab work.  Continue current Rx.    4. History of pulmonary embolism As noted, she was recently admitted with pulmonary embolism.  Echocardiogram did not demonstrate any evidence of RV strain.  She has now anticoagulated with Apixaban.  This is managed by primary care.  She has been evaluated by hematology.  This pulmonary embolism did not seem to be provoked and she will likely need to remain on long-term anticoagulation.  5. Bilateral carotid artery stenosis She had a recent trip to the emergency room with  paresthesias.  She continues to have tingling in her left arm and left leg.  She has an evaluation with neurology next week.  As noted, MRI in the emergency room did not demonstrate any evidence of acute CVA.  CT was negative for bleed.  Her recent echocardiogram demonstrated normal LV function.  There has been no documented atrial fibrillation.  She does have a history of TIA.  Carotid Dopplers in 2018 demonstrated mild bilateral ICA stenosis.  I have recommended that we proceed with follow-up carotid ultrasound.  Otherwise, no other CV testing is needed at this time.  Continue atorvastatin.         Dispo:  Return in about 3 months (around 10/11/2021) for Routine Follow Up, w/ Dr. Harrington Challenger.   Medication Adjustments/Labs and Tests Ordered: Current medicines are reviewed at length with the patient today.  Concerns regarding medicines are outlined above.  Tests Ordered: Orders Placed This Encounter  Procedures   VAS US CAROTID    Medication Changes: No orders of the defined types were placed in this encounter.   Signed, Richardson Dopp, PA-C  07/11/2021 10:35 AM    [facility] [address], [place], [place]  [address] Phone: [phone]; Fax: [phone]

## 2021-07-11 ENCOUNTER — Ambulatory Visit: Payer: Medicare PPO | Admitting: Physician Assistant

## 2021-07-11 ENCOUNTER — Encounter: Payer: Self-pay | Admitting: Physician Assistant

## 2021-07-11 VITALS — BP 124/64 | HR 68 | Ht 64.0 in | Wt 148.4 lb

## 2021-07-11 DIAGNOSIS — I471 Supraventricular tachycardia: Secondary | ICD-10-CM | POA: Diagnosis not present

## 2021-07-11 DIAGNOSIS — I10 Essential (primary) hypertension: Secondary | ICD-10-CM | POA: Diagnosis not present

## 2021-07-11 DIAGNOSIS — E782 Mixed hyperlipidemia: Secondary | ICD-10-CM | POA: Diagnosis not present

## 2021-07-11 DIAGNOSIS — I6523 Occlusion and stenosis of bilateral carotid arteries: Secondary | ICD-10-CM

## 2021-07-11 DIAGNOSIS — Z86711 Personal history of pulmonary embolism: Secondary | ICD-10-CM

## 2021-07-11 LAB — COMPLETE METABOLIC PANEL WITH GFR
AG Ratio: 1.7 (calc) (ref 1.0–2.5)
ALT: 22 U/L (ref 6–29)
AST: 18 U/L (ref 10–35)
Albumin: 4.1 g/dL (ref 3.6–5.1)
Alkaline phosphatase (APISO): 50 U/L (ref 37–153)
BUN/Creatinine Ratio: 18 (calc) (ref 6–22)
BUN: 24 mg/dL (ref 7–25)
CO2: 24 mmol/L (ref 20–32)
Calcium: 9.5 mg/dL (ref 8.6–10.4)
Chloride: 104 mmol/L (ref 98–110)
Creat: 1.36 mg/dL — ABNORMAL HIGH (ref 0.60–0.95)
Globulin: 2.4 g/dL (calc) (ref 1.9–3.7)
Glucose, Bld: 89 mg/dL (ref 65–99)
Potassium: 4.8 mmol/L (ref 3.5–5.3)
Sodium: 138 mmol/L (ref 135–146)
Total Bilirubin: 0.6 mg/dL (ref 0.2–1.2)
Total Protein: 6.5 g/dL (ref 6.1–8.1)
eGFR: 38 mL/min/{1.73_m2} — ABNORMAL LOW (ref >=60)

## 2021-07-11 LAB — LUPUS ANTICOAGULANT AND ANTIPHOSPHOLIPID CONFIRM W/CONSULT
Anticardiolipin IgG: <2 GPL-U/mL (ref <20.0)
Anticardiolipin IgM: <2 MPL-U/mL (ref <20.0)
Beta-2 Glyco 1 IgM: <2 U/mL (ref <20.0)
Beta-2 Glyco I IgG: <2 U/mL (ref <20.0)
PTT-LA Screen: 57 s — ABNORMAL HIGH (ref <=40)
dRVVT: 56 s — ABNORMAL HIGH (ref <=45)

## 2021-07-11 LAB — CBC WITH DIFFERENTIAL/PLATELET
Absolute Monocytes: 647 cells/uL (ref 200–950)
Basophils Absolute: 92 cells/uL (ref 0–200)
Basophils Relative: 1.5 %
Eosinophils Absolute: 177 cells/uL (ref 15–500)
Eosinophils Relative: 2.9 %
HCT: 45.7 % — ABNORMAL HIGH (ref 35.0–45.0)
Hemoglobin: 14.7 g/dL (ref 11.7–15.5)
Lymphs Abs: 1757 cells/uL (ref 850–3900)
MCH: 28.3 pg (ref 27.0–33.0)
MCHC: 32.2 g/dL (ref 32.0–36.0)
MCV: 87.9 fL (ref 80.0–100.0)
MPV: 9.9 fL (ref 7.5–12.5)
Monocytes Relative: 10.6 %
Neutro Abs: 3428 cells/uL (ref 1500–7800)
Neutrophils Relative %: 56.2 %
Platelets: 436 10*3/uL — ABNORMAL HIGH (ref 140–400)
RBC: 5.2 10*6/uL — ABNORMAL HIGH (ref 3.80–5.10)
RDW: 13.7 % (ref 11.0–15.0)
Total Lymphocyte: 28.8 %
WBC: 6.1 10*3/uL (ref 3.8–10.8)

## 2021-07-11 LAB — RFLX HEXAGONAL PHASE CONFIRM: Hexagonal Phase Conf: POSITIVE — AB

## 2021-07-11 LAB — RFLX DRVVT CONFRIM: DRVVT CONFIRM: NEGATIVE

## 2021-07-11 LAB — COAG CONSULT LEVEL LIMITED

## 2021-07-11 LAB — CARDIOLIPIN ANTIBODY: Phospholipids: 163 mg/dL (ref 151–264)

## 2021-07-11 NOTE — Patient Instructions (Signed)
Medication Instructions:   Your physician recommends that you continue on your current medications as directed. Please refer to the Current Medication list given to you today.  *If you need a refill on your cardiac medications before your next appointment, please call your pharmacy*   Lab Work:  -NONE  If you have labs (blood work) drawn today and your tests are completely normal, you will receive your results only by: [place] (if you have MyChart) OR A paper copy in the mail If you have any lab test that is abnormal or we need to change your treatment, we will call you to review the results.   Testing/Procedures: Your physician has requested that you have a carotid duplex. Monday, August 8 @ 12:30 pm. This test is an ultrasound of the carotid arteries in your neck. It looks at blood flow through these arteries that supply the brain with blood. Allow one hour for this exam. There are no restrictions or special instructions.    Follow-Up: At [facility], you and your health needs are our priority.  As part of our continuing mission to provide you with exceptional heart care, we have created designated Provider Care Teams.  These Care Teams include your primary Cardiologist (physician) and Advanced Practice Providers (APPs -  Physician Assistants and Nurse Practitioners) who all work together to provide you with the care you need, when you need it.  We recommend signing up for the patient portal called "MyChart".  Sign up information is provided on this After Visit Summary.  MyChart is used to connect with patients for Virtual Visits (Telemedicine).  Patients are able to view lab/test results, encounter notes, upcoming appointments, etc.  Non-urgent messages can be sent to your provider as well.   To learn more about what you can do with MyChart, go to [email or web address].    Your next appointment:   3 month(s) with Dr.Ross on Friday, November 11 @ 10:20 am.   The format  for your next appointment:   In Person  Provider:   Dorris Carnes, MD   Other Instructions

## 2021-07-16 ENCOUNTER — Other Ambulatory Visit: Payer: Self-pay

## 2021-07-16 ENCOUNTER — Other Ambulatory Visit (HOSPITAL_COMMUNITY): Payer: Self-pay | Admitting: Physician Assistant

## 2021-07-16 ENCOUNTER — Ambulatory Visit (HOSPITAL_COMMUNITY)
Admission: RE | Admit: 2021-07-16 | Discharge: 2021-07-16 | Disposition: A | Discharge status: Home / Self Care | Payer: Medicare PPO | Source: Ambulatory Visit | Attending: Cardiovascular Disease | Admitting: Cardiovascular Disease

## 2021-07-16 DIAGNOSIS — Z86711 Personal history of pulmonary embolism: Secondary | ICD-10-CM

## 2021-07-16 DIAGNOSIS — E782 Mixed hyperlipidemia: Secondary | ICD-10-CM | POA: Insufficient documentation

## 2021-07-16 DIAGNOSIS — I471 Supraventricular tachycardia: Secondary | ICD-10-CM | POA: Diagnosis not present

## 2021-07-16 DIAGNOSIS — I6523 Occlusion and stenosis of bilateral carotid arteries: Secondary | ICD-10-CM | POA: Insufficient documentation

## 2021-07-16 DIAGNOSIS — I10 Essential (primary) hypertension: Secondary | ICD-10-CM | POA: Diagnosis not present

## 2021-07-17 ENCOUNTER — Encounter: Payer: Self-pay | Admitting: Neurology

## 2021-07-17 ENCOUNTER — Ambulatory Visit: Payer: Medicare PPO | Admitting: Neurology

## 2021-07-17 VITALS — BP 137/69 | HR 66 | Ht 64.0 in | Wt 148.4 lb

## 2021-07-17 DIAGNOSIS — T887XXA Unspecified adverse effect of drug or medicament, initial encounter: Secondary | ICD-10-CM

## 2021-07-17 DIAGNOSIS — R400 Somnolence: Secondary | ICD-10-CM

## 2021-07-17 DIAGNOSIS — G47419 Narcolepsy without cataplexy: Secondary | ICD-10-CM

## 2021-07-17 DIAGNOSIS — G250 Essential tremor: Secondary | ICD-10-CM | POA: Diagnosis not present

## 2021-07-17 MED ORDER — CLONAZEPAM 0.5 MG PO TABS: 0.2500 mg | ORAL_TABLET | Freq: Three times a day (TID) | ORAL | 1 refills | Status: DC

## 2021-07-17 NOTE — Progress Notes (Signed)
Subjective:    Patient ID: Monica Curry is a 87 y.o. female.  HPI    Interim history:   Monica Curry is an 87 year old right-handed woman with an underlying medical history of uterine cancer, anxiety, status post hysterectomy and tonsillectomy, hospitalization for suspected TIA in 10/2017, who presents for a new problem visit of episodes of inattention and sleepiness.  She had recent hospitalization in early July for shortness of breath and chest pain and was found to have PEs.  She was complaining of increased sleepiness and inactivity at the time.  It was discussed that medications could contribute to her sleepiness, in particular, her clonazepam.  I last saw her on 01/18/2019, at which time she reported increase in stress.  I suggested we continue with her current medication including her propranolol and low-dose clonazepam.  I did not feel it was a good idea to increase her clonazepam.  We talked about DBS evaluation and she requested a referral.  She wanted to stay in [place].  She was referred to Dr. Carles Collet.  She saw Dr. Carles Collet in consultation on 02/18/2019, at which time the patient did not want to pursue surgical treatment.  She saw Monica Givens, NP in the interim on 07/21/2019, at which time she was advised to continue with her current medication regimen.  She had a follow-up appointment with Monica Givens, NP on 03/27/2020, at which time she felt that her tremor had become worse.  She was advised to start low-dose Topamax at 25 mg strength once daily.  She was seen in follow-up on 10/02/2020 by Monica Givens, NP, at which time the patient reported benefit from taking Topamax at 25 mg once daily.  She continued to be on clonazepam and propranolol at the time.  She was advised to continue her medications.  The patient called in June 2022 reporting that she would fall asleep inadvertently during the day.  She was advised not to drive.  Today, 07/17/2021: She reports that she kept a log of  her sleepiness.  She had significant sleepiness starting in May.  She had instances of suddenly dozing off without any warning.  She has since then tried to stay very active.  She is worried about reducing the clonazepam for fear of flareup and anxiety.  She has been on clonazepam for years.  She reports that it helps with her anxiety as well as anxiety surrounding her tremors.  She is worried that the Topamax could have contributed to her blood clot.  I explained to her that I had never seen a correlation between Topamax and blood clots.  Nevertheless, it can cause tingling and sleepiness.  She does report intermittent tingling in the left upper and lower extremities.  In the past, for her anxiety, she had tried other medications, she had seen a psychiatrist but currently declines a referral to psychiatry.  She also tried Ativan in the past.  Of note, in reviewing her past medical history, she had a history of pulmonary embolus years ago.   She had a follow-up appointment with her primary care physician on 06/19/2021 and I reviewed the note.  She also had a recent emergency room visit on 07/07/2021 for left-sided tingling.  She had a brain MRI without contrast on 07/07/2021 and I reviewed the results:  IMPRESSION: 1. No acute intracranial abnormality. 2. Stable noncontrast MRI appearance of the brain since 2018, with moderate nonspecific cerebral white matter signal changes.   The patient's allergies, current medications, family history, past medical history,  past social history, past surgical history and problem list were reviewed and updated as appropriate.    Previously (copied from previous notes for reference):    I saw her on 01/19/2018, at which time we talked about her recent hospitalization. Her tremors were stable. She was advised to continue with her current doses of propranolol of clonazepam. Of note, she was hospitalized from 10/23/2017 through 10/24/2017 for a transient episode of expressive  aphasia. She had TIA/stroke workup. I reviewed the hospital records and test results. MRI brain showed no acute intracranial process, MRA head showed no significant intracranial large vessel occlusion or stenosis. She had a small left ICA aneurysm and small right ICA aneurysm. Echocardiogram showed EF of 55-60 with grade 1 diastolic dysfunction. Carotid Doppler studies showed no significant ICA stenosis, she had no therapy needs. LDL was 105, A1c was 5.5.    She was started on a statin but discontinued it.   When she has an episode of lightheadedness and near fainting she feels like she is going to fall but she has not fallen. She does not actually have an episode of actual loss of consciousness. Interestingly, her sister had similar episodes and eventually had a pacemaker placed. Patient reports that she has chest tightness and it feels like her heart stops for moment.   She was seen by Monica Curry in the interim on 07/14/2018, at which time she was advised to continue with her propranolol and clonazepam. Patient reported that her PCP had increased her beta blocker after her TIA admission due to blood pressure increase but she was going to discuss reducing it back again.   I saw her on 01/08/2017, at which time she was fairly stable and still indicated that clonazepam and Inderal were helpful. I suggested we continue with generic Inderal 20 mg 3 times a day and low-dose clonazepam 3 times a day.   She saw Monica Curry in the interim on 07/16/2017, at which time she was deemed stable and advised to maintain on her current medication regimen for symptomatic control for her essential tremor.   She saw Dr. Leonie Man in follow-up after her recent hospital admission for TIA concern on TIA admission on 01/08/2018 and I reviewed his note. She was advised to have a Holter monitor for 1 month, she was advised to continue with adult size daily aspirin.    I saw her on 01/08/2016, at which time she felt she was  doing quite well, stable with her tremor overall but flareup notable when she was stressed out or when anxious. I suggested we continue with her current medication regimen for her tremors.   I saw her on 07/03/2015, at which time she reported having had C. 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 Tremor wise she felt stable and had no side effects on her medications. I suggested she continue with the same doses of propranolol and low-dose clonazepam.   I saw her on 01/02/2015, at which time she reported doing well. She had tried the propranolol long-acting at one time in the past, but it did not work as well. She was prescribed trazodone 150 mg by her PCP. She tried 1/2 pill for about 2 weeks, but it did not help and made her drowsy during the day. She tried melatonin with no significant success, and she bought a white noise machine and stopped  the melatonin. She had a reaction to Pneumovax in 2015 and did take the flu shot. I suggested she continue low-dose clonazepam and kept her on Inderal generic 20 mg 3 times a day.   I saw her on 06/30/14, at which time she reported doing well from the tremor standpoint. She was having trouble going to sleep and staying asleep. She was worried about her brother who had heart surgery and was in the hospital. I kept her on propranolol and clonazepam.   I saw her on 12/31/2013, at which time I felt she was stable. I did not change her medications. I suggested a routine six-month checkup. She reported volunteering at the [facility]. She felt stable. She reported having a cousin with Parkinson's disease and one aunt with voice tremor. She had a bone density scan on 10/14/13 and it showed stable findings.   I first met her on 07/02/2013, at which time I felt her exam was stable and it did not change her  medications. I had reviewed recent blood work and did not have any new test at the time. I renewed her prescriptions for propranolol and clonazepam at the time.     She previously followed with Dr. Morene Antu and last saw him on 12/31/2012, and which time he started her on propranolol.   She felt the propranolol 60 mg long-acting once daily was not as effective and was switched to propranolol 20 mg tid, clonazepam 0.5 mg tablet one in the morning, 0.25 mg at noon and 0.25 mg in the evening. Activity makes the tremor worse. She does note an improvement with alcohol and takes only one propranolol when she has had a drink. She drinks occasionally.   She has a history of tremors for many years, treated with clonazepam for a number of years, but tremor exacerbated in 1997 after her pulmonary embolus. She had withdrawal symptoms coming off of clonazepam. She had been on Mysoline but did not tolerate it. She has seen several other doctors for the tremor. TSH was negative in the past. She was tried on methazolamide without benefit. X ray lumbar spine from 03/24/13 showed T9 compression fracture, age indeterminate.  Her Past Medical History Is Significant For: Past Medical History:  Diagnosis Date   C. difficile colitis    Cancer ([place])    Endometrial cancer   Essential and other specified forms of tremor    Osteoporosis 11/2019   T score -2.8 stable from prior DEXA   Personal history of venous thrombosis and embolism 1997   Pulmonary embolus ([facility])    Shingles    Stroke ([place])    Thrush    TIA (transient ischemic attack)     Her Past Surgical History Is Significant For: Past Surgical History:  Procedure Laterality Date   APPENDECTOMY  1961   cataract surg Bilateral 2021   Dr. Gershon Crane, R eye 8/18, L 8/25   CESAREAN SECTION  1961   TONSILLECTOMY AND ADENOIDECTOMY     TOTAL ABDOMINAL HYSTERECTOMY W/ BILATERAL SALPINGOOPHORECTOMY  1997    Her Family History Is Significant For: Family History   Problem Relation Age of Onset   Cancer Mother        Unknown type   Dementia Mother    Cancer Father        Bone   Heart disease Brother    Prostate cancer Brother        Prostate   Heart disease Brother    Kidney failure Brother  Prostate cancer Brother        Prostate   Heart disease Sister    Blindness Sister 1       temporal arteritis   Skin cancer Sister        basal and melanoma   Heart disease Sister    Kidney failure Sister        Kidney transplant   Diabetes Sister    Hypertension Sister    Heart disease Brother     Her Social History Is Significant For: Social History   Socioeconomic History   Marital status: Widowed    Spouse name: Not on file   Number of children: 1   Years of education: Not on file   Highest education level: Not on file  Occupational History   Not on file  Tobacco Use   Smoking status: Never   Smokeless tobacco: Never  Vaping Use   Vaping Use: Never used  Substance and Sexual Activity   Alcohol use: Not Currently    Alcohol/week: 0.0 standard drinks   Drug use: No   Sexual activity: Not Currently    Birth control/protection: Post-menopausal, Surgical    Comment: HYST-1st intercourse 87 yo-Fewer than 5 partners  Other Topics Concern   Not on file  Social History Narrative   Not on file   Social Determinants of Health   Financial Resource Strain: Not on file  Food Insecurity: Not on file  Transportation Needs: Not on file  Physical Activity: Not on file  Stress: Not on file  Social Connections: Not on file    Her Allergies Are:  Allergies  Allergen Reactions   Codeine Hives   Hctz [Hydrochlorothiazide] Other (See Comments)    Low sodium   Meloxicam Nausea Only    Gastritis and nausea   Mysoline [Primidone]    Pneumovax 23 [Pneumococcal Vac Polyvalent]     Red, raised area.  Knot at site of injection   Toradol [Ketorolac Tromethamine] Swelling    Localized redness at injection site.     Ace Inhibitors Cough   :   Her Current Medications Are:  Outpatient Encounter Medications as of 07/17/2021  Medication Sig   acetaminophen (TYLENOL) 500 MG tablet Take 500 mg by mouth every 6 (six) hours as needed for moderate pain.   apixaban (ELIQUIS) 5 MG TABS tablet Take  1 tablet 2 x/ day to Prevent Blood Clots   aspirin EC 81 MG tablet Take 1 tablet (81 mg total) by mouth daily. Swallow whole.   atorvastatin (LIPITOR) 10 MG tablet Take 1 tablet Daily for Cholesterol   Calcium-Magnesium-Vitamin D 300-20-200 MG-MG-UNIT CHEW Chew 1 tablet daily by mouth.    Cholecalciferol (VITAMIN D3) 5000 units TABS Take 2 tablets by mouth daily.    clonazePAM (KLONOPIN) 0.5 MG tablet TAKE 1 TABLET BY MOUTH EVERY MORNING AND 1/2 TABLET AT NOON AND 1/2 TABLET AT NIGHT   Magnesium 250 MG TABS Take 250 mg by mouth 2 (two) times a day.   olmesartan (BENICAR) 5 MG tablet Take 5 mg by mouth daily as needed (if blood pressure is too high). For blood pressure only if 150/90+   propranolol (INDERAL) 40 MG tablet TAKE 1 TABLET BY MOUTH FOUR TIMES DAILY WITH MEALS AND BEDTIME FOR BLOOD PRESSURE AND TREMOR   topiramate (TOPAMAX) 25 MG tablet TAKE 1 TABLET(25 MG) BY MOUTH AT BEDTIME   vitamin B-12 (CYANOCOBALAMIN) 500 MCG tablet Take 500 mcg by mouth daily. Sublingual   No facility-administered encounter medications on  file as of 07/17/2021.  :  Review of Systems:  Out of a complete 14 point review of systems, all are reviewed and negative with the exception of these symptoms as listed below: Review of Systems  Neurological:        Pt states she is coming in to see if she has narcolepsy. Unsure on why her visit for today   [8/2 3:01 PM] Bell, Candace Epworth Sleepiness Scale 0= would never doze 1= slight chance of dozing 2= moderate chance of dozing 3= high chance of dozing Sitting and reading:1 Watching TV:3 Sitting inactive in a public place (ex. Theater or meeting):0 As a passenger in a car for an hour without a  break:0 Lying down to rest in the afternoon:1 Sitting and talking to someone:1 Sitting quietly after lunch (no alcohol):2 In a car, while stopped in traffic:0 Total: 8      Objective:  Neurological Exam  Physical Exam Physical Examination:   Vitals:   07/17/21 1104  BP: 137/69  Pulse: 66    General Examination: The patient is a very pleasant 87 y.o. female in no acute distress. She appears well-developed and well-nourished and well groomed.   HEENT: Normocephalic, atraumatic, extraocular tracking is well-preserved, face is symmetric, speech is clear without dysarthria, she has a mild voice tremor.  No carotid bruits.     Chest: Clear to auscultation without wheezing, rhonchi or crackles noted.   Heart: S1+S2+0, regular and normal without murmurs, rubs or gallops noted.   Abdomen: Soft, non-tender and non-distended.   Extremities: There is no obvious edema in the distal lower extremities bilaterally.   Skin: Warm and dry without trophic changes noted.   Musculoskeletal: exam reveals no obvious joint deformities.   Neurologically: Mental status: The patient is awake, alert and oriented in all 4 spheres. Her immediate and remote memory, attention, language skills and fund of knowledge are appropriate. There is no evidence of aphasia, agnosia, apraxia or anomia. Speech is clear with normal prosody and enunciation. Thought process is linear. Mood is normal and affect is normal. Cranial nerves II - XII are as described above under HEENT exam. Motor exam: Normal bulk, strength and tone is noted. There is no drift, or obvious resting tremor.  She has a stable appearing postural and action tremor in the upper extremities. Reflexes are 1+ throughout. Cerebellar testing: No dysmetria or intention tremor. There is no truncal ataxia. Sensory exam: intact to light touch in the upper and lower extremities.   Assessment and Plan:    In summary, Izabela Ow is an 87 year old female  with an underlying medical history of anxiety, osteoporosis, history of DVT and PE, recent hospitalization in July 2022 for PE, hypertension, hyperlipidemia, and vitamin D deficiency, s/p C section, appendectomy, hysterectomy, tonsillectomy, BSO, who presents for follow-up consultation of her essential tremor. She presents for a sooner than scheduled appointment because of recent increase in daytime somnolence and episodes of falling asleep inadvertently.  History is not suggestive of narcolepsy.  She is advised that medication side effects is a concern.  She has been on Topamax low-dose and clonazepam but even Inderal can cause tiredness and sedation and a combination of these medications may very well be a contributor to her increase in sleepiness in the past few months.  We had talked about her medication management before.  I had explained to her that increasing clonazepam would not be recommended in the past.  I would now recommend that we taper her down on  clonazepam for fear of side effects.  She is concerned about flareup of her anxiety.  I offered a referral to psychiatry today but she declines.  She had previous consultation with Dr. Carles Collet for DBS evaluation for essential tremor, but she did not want to pursue surgical treatment.  She has been on Topamax 25 mg at bedtime and is currently advised to reduce the clonazepam to half a pill 3 times daily and taper off Topamax by reducing it to half a pill nightly for the next 2 weeks.  She is concerned that she will not be able to cut the pill in half.  If this is indeed not possible, she can also take 25 mg every other night for the next 2 weeks and then stop it altogether.  She was given detailed written instructions.  She is advised that I will change the clonazepam prescription to reflect the new dosing, half a pill 3 times a day.  We will slowly taper her over the next several months.  She has an appointment pending with Monica Browner, NP in October and we  will review her symptoms at the time.  For evaluation with a sleep study to rule out narcolepsy, she is advised that sleep testing would require that she be off of sedating medications and she would have to be off the clonazepam and Topamax for sleep testing.  For now, I recommend that we pursue an EEG.  She is agreeable to this approach.  She is advised to talk to her primary care physician about alternative treatment options for anxiety and management strategies should she have a flareup in her anxiety.  Again, she declines a referral to psychiatry today.  She is advised to keep her follow-up appointment in October with [facility] as scheduled. I answered all her questions today and she was in agreement.I spent 30 minutes in total face-to-face time and in reviewing records during pre-charting, more than 50% of which was spent in counseling and coordination of care, reviewing test results, reviewing medications and treatment regimen and/or in discussing or reviewing the diagnosis of ET, sleepiness, the prognosis and treatment options. Pertinent laboratory and imaging test results that were available during this visit with the patient were reviewed by me and considered in my medical decision making (see chart for details).

## 2021-07-17 NOTE — Patient Instructions (Addendum)
It was nice to see you again today.  As discussed, I believe your increase in sleepiness and sudden inadvertent sleep attacks are likely secondary to medication side effects, possibly from a combination of medicines including your Topamax, clonazepam, even the Inderal.  I would like for you to taper off the Topamax and reduce gradually your clonazepam as well.  For now, if your Topamax cannot be split in half, please consult with your pharmacist regarding possibilities of breaking it in half pills.  If possible, and if they can help you break it in half, please take Topamax 25 mg strength half a pill daily at bedtime for the next 2 weeks and then stop it.  If your pharmacist agrees that it cannot be cut in half, we can have you take 1 pill every other night for the next 2 weeks and then you can stop it altogether.  As explained, I am not aware of any report of blood clots with Topamax.  However, it can be sedating and can cause tingling.  As far as your clonazepam, I would like for you to ideally completely taper off of it over time.  For now, we will reduce the morning dose to half a pill and continue with the midday and evening doses at half pills. You will therefore start taking half a pill 3 times daily for the next couple of months until you see Monica Curry again.  We will further taper it over time.  In order to pursue sleep testing for narcolepsy, you will have to be off of Topamax and clonazepam.  For now, I would like to order an EEG (brainwave test), which we will schedule. We will call you with the results.  If you have a flareup in your anxiety as you taper down on the clonazepam, we can consider a referral to psychiatry in the future.  You can also discuss with your primary care physician alternative treatment options and management strategies for anxiety.

## 2021-07-19 ENCOUNTER — Telehealth: Payer: Self-pay

## 2021-07-19 DIAGNOSIS — E041 Nontoxic single thyroid nodule: Secondary | ICD-10-CM

## 2021-07-19 NOTE — Telephone Encounter (Signed)
-----   Message from Liliane Shi, [place] sent at 07/19/2021  8:52 AM EDT ----- Carotid US shows minimal plaque without significant ICA stenosis.   There was a R thyroid nodule noted on this study.  Of note, TSH in July 2022 was normal.  PLAN:  -Please arrange dedicate thyroid US (Dx: R thyroid nodule) -I will send copy to PCP as Juluis Rainier -Ask pt to f/u with PCP after US done for further evaluation (if needed) Richardson Dopp, PA-C    07/19/2021 8:47 AM

## 2021-07-19 NOTE — Telephone Encounter (Signed)
The patient has been notified of the result and verbalized understanding.  All questions (if any) were answered. Antonieta Iba, RN 07/19/2021 10:34 AM  Thyroid US has been ordered.

## 2021-07-26 ENCOUNTER — Telehealth: Payer: Self-pay | Admitting: *Deleted

## 2021-07-26 ENCOUNTER — Telehealth: Payer: Self-pay | Admitting: Neurology

## 2021-07-26 ENCOUNTER — Other Ambulatory Visit: Payer: Medicare PPO

## 2021-07-26 NOTE — Telephone Encounter (Signed)
Called patient and reviewed Dr Guadelupe Sabin message and recommendations with her. She will complete topamax taper on Mon. Patient verbalized agreement, understanding, appreciation.

## 2021-07-26 NOTE — Telephone Encounter (Signed)
Called patient who stated she's weaning off topamax, taking every other day. She's not taking topamax for seizures but was told tapering off can trigger a seizure. She's been tapering a little over a week. The  24 hrs off pill she feels better than 24 hrs she's on pill. She took it last night, woke today 6 am with slight headache, body went a little stiff, had strange sensation over body, felt like her BP was very high. Lasted a few minutes, and she lay in bed,  no LOC. She eventually went back to sleep. When she woke her pulse was not racing. BP after getting up later was 164/92, now BP is 147/91. She stated she's not sure if it was a seizure, possible heart attack or stroke.  She stated a few weeks ago she called 911 for possible stroke or seizure due to similar symptoms. She was checked and but it was nothing. She is not sure whether to continue with topamax taper. I advised will send to Dr Rexene Alberts and let her know. Advised if she experiences symptoms again to call 911. Patient verbalized understanding, appreciation.

## 2021-07-26 NOTE — Telephone Encounter (Signed)
Patient called and states she thinks she could have had a seizure the night of 07/25/2021. She had a slight headache, body stiffness,and felt strange. State she did not have LOC and episode lasted 2 to 3 minutes. Dr Melford Aase states probably not seizure and could be a TIA. Advised patient call 911, if occurs again. Patiet declined appointment at this time. Patient weaning off Topamax and Dr Rexene Alberts has advised patient to reduce her

## 2021-07-26 NOTE — Telephone Encounter (Signed)
Pt called needing to speak to a RN regarding a possible seizure that she just had. Please advise.

## 2021-07-26 NOTE — Telephone Encounter (Signed)
Patient called and thinks she may have had a seizure the night of 07/25/2021. States she had headache ,stiff feeling, but no LOC. Episode lasted 2 to 3 minutes, not disoriented. Per Dr Melford Aase, most likely a TIA. Patient declined OV at this time. Advised to call 911, if symptoms return.

## 2021-07-26 NOTE — Telephone Encounter (Signed)
Patient called and expressed she thought she may have had a seizure during the night of 07/25/2021. She states she had a slight

## 2021-07-26 NOTE — Telephone Encounter (Signed)
Symptoms as described and documented are not in keeping with seizure and unlikely to be related to Topamax as she was on such a small dose.  We often just go ahead and stop it at this dose.  If she has completed the taper, she can continue to stay off of it.  I would encourage her to go to the emergency room if she has ongoing symptoms and also check with her primary care physician, they may be able to see her as a walk-in/urgently.

## 2021-07-30 NOTE — Progress Notes (Signed)
Future Appointments  Date Time Provider [place]  07/31/2021 11:00 AM Unk Pinto, MD [facility] None  08/01/2021  1:00 PM [facility] [place] [facility]  08/09/2021  9:30 AM Dahlia Client, RPSGT [facility] None  08/14/2021  2:00 PM [name], NP [facility] None  10/02/2021 11:00 AM [name], NP [facility] None  10/19/2021 10:20 AM Fay Records, MD [facility]  01/10/2022  2:30 PM [name], NP [facility] None    History of Present Illness:     Patient is a very nice 87 yo WWF with concerns of recent labile BP up to 162/100. She has been experiencing sx's of anxiety since tapering her very low dose Clonazepam as recommended by her Neurologist.     Medications    atorvastatin (LIPITOR) 10 MG tablet, Take 1 tablet Daily for Cholesterol    olmesartan (BENICAR) 5 MG tablet, Take  daily as needed . For blood pressure only if 150/90+    propranolol (INDERAL) 40 MG tablet, TAKE 1 TABLET FOUR TIMES DAILY WITH MEALS AND BEDTIME FOR BLOOD PRESSURE AND TREMOR   acetaminophen 500 MG tablet, Take  every 6 (six) hours as needed for moderate pain.   aspirin EC 81 MG tablet, Take 1 tablet  daily   ELIQUIS 5 MG TABS  Take  1 tablet 2 x/ day to Prevent Blood Clots   vitamin B-12  500 MCG tablet, Take daily. Sublingual    Calcium-Mag-VitD 300-20-200 MG-MG-U, 1 tablet daily   Cholecalciferol (VITAMIN D3) 5000 units TABS, Take 2 tablets  daily.    clonazePAM (KLONOPIN) 0.5 MG tablet, Take 0.5 tablets  3 times daily. Please note change in dosing.   Magnesium 250 MG TABS, Take 2  times a day.   topiramate (TOPAMAX) 25 MG tablet, TAKE 1 TABLET(25 MG) BY MOUTH AT BEDTIME  Problem list She has History of endometrial cancer; Hx/o DVT/PE; Osteoporosis; Urethral prolapse; Essential hypertension; Hyperlipidemia, mixed; Abnormal glucose; Vitamin D deficiency; Essential tremor; Medication management; Chronic kidney disease, stage 3b ([place]); History of TIA (transient  ischemic attack); B12 deficiency; History of skin cancer (basal cell); Complete right bundle branch block; Paroxysmal supraventricular tachycardia ([place]); Bilateral renal cysts; Pulmonary embolism ([facility]); and Aortic atherosclerosis ([place]) - CT 06/2021 on their problem list.   Observations/Objective:  BP (!) 151/99   Pulse 68   Temp (!) 97.3 F (36.3 C)   Resp 16   Ht '5\' 4"'$  (1.626 m)   Wt 147 lb (66.7 kg)   SpO2 97%   BMI 25.23 kg/m   HEENT - WNL. Neck - supple.  Chest - Clear equal BS. Cor - Nl HS. RRR w/o sig MGR. PP 1(+). No edema. MS- FROM w/o deformities.  Gait Nl. Neuro -  Nl w/o focal abnormalities.   Assessment and Plan:   1. Essential hypertension   - Advised to increase he Benicar 5 mg to 2 tabs = 10 mg & take at night.              & ( call for refill of 10 or 20 mg when needed )   2. Chronic anxiety  - escitalopram (LEXAPRO) 5 MG tablet;  Take  1 tablet  Daily  for Chronic Anxiety   Disp: 90 tablet; Refill: 1   Follow Up Instructions:        I discussed the assessment and treatment plan with the patient. The patient was provided an opportunity to ask questions and all were answered. The patient agreed with the plan  and demonstrated an understanding of the instructions.       The patient was advised to call back or seek an in-person evaluation if the symptoms worsen or if the condition fails to improve as anticipated.    Kirtland Bouchard, MD

## 2021-07-31 ENCOUNTER — Encounter: Payer: Self-pay | Admitting: Internal Medicine

## 2021-07-31 ENCOUNTER — Ambulatory Visit: Payer: Medicare PPO | Admitting: Internal Medicine

## 2021-07-31 ENCOUNTER — Other Ambulatory Visit: Payer: Self-pay

## 2021-07-31 VITALS — BP 151/99 | HR 68 | Temp 97.3°F | Resp 16 | Ht 64.0 in | Wt 147.0 lb

## 2021-07-31 DIAGNOSIS — I10 Essential (primary) hypertension: Secondary | ICD-10-CM | POA: Diagnosis not present

## 2021-07-31 DIAGNOSIS — F419 Anxiety disorder, unspecified: Secondary | ICD-10-CM

## 2021-07-31 MED ORDER — ESCITALOPRAM OXALATE 5 MG PO TABS: ORAL_TABLET | ORAL | 1 refills | Status: DC

## 2021-07-31 NOTE — Patient Instructions (Signed)

## 2021-08-01 ENCOUNTER — Ambulatory Visit
Admission: RE | Admit: 2021-08-01 | Discharge: 2021-08-01 | Disposition: A | Discharge status: Home / Self Care | Payer: Medicare PPO | Source: Ambulatory Visit | Attending: Physician Assistant | Admitting: Physician Assistant

## 2021-08-01 DIAGNOSIS — E041 Nontoxic single thyroid nodule: Secondary | ICD-10-CM | POA: Diagnosis not present

## 2021-08-02 ENCOUNTER — Other Ambulatory Visit: Payer: Self-pay | Admitting: Internal Medicine

## 2021-08-02 DIAGNOSIS — E041 Nontoxic single thyroid nodule: Secondary | ICD-10-CM

## 2021-08-02 NOTE — Progress Notes (Signed)
============================================================ ============================================================  -    Requested fine needle biopsy of Right Thyroid nodule at                                               [facility] - Nuclear Medicine section  ============================================================ ============================================================

## 2021-08-07 ENCOUNTER — Other Ambulatory Visit: Payer: Self-pay | Admitting: Adult Health

## 2021-08-09 ENCOUNTER — Ambulatory Visit: Payer: Medicare PPO

## 2021-08-09 ENCOUNTER — Other Ambulatory Visit: Payer: Self-pay

## 2021-08-09 ENCOUNTER — Telehealth: Payer: Self-pay | Admitting: *Deleted

## 2021-08-09 DIAGNOSIS — R400 Somnolence: Secondary | ICD-10-CM | POA: Diagnosis not present

## 2021-08-09 DIAGNOSIS — G250 Essential tremor: Secondary | ICD-10-CM

## 2021-08-09 DIAGNOSIS — G47419 Narcolepsy without cataplexy: Secondary | ICD-10-CM

## 2021-08-09 NOTE — Telephone Encounter (Signed)
Noted thanks °

## 2021-08-09 NOTE — Telephone Encounter (Signed)
Called pt and LVM asking for call back. When the patient calls back, please let her know that Dr Rexene Alberts reviewed the EEG or brain wave test. The findings were normal in both her awake and drowsy states. There were no findings of abnormal electrical discharges in the brain. We will plan to see her at the follow-up appointment on October 02, 2021 at 11:00 AM.

## 2021-08-09 NOTE — Procedures (Signed)
    History:  87 year old right-handed woman with medical history of uterine cancer, anxiety, status post hysterectomy and tonsillectomy who is having EEG for upcoming sleep study.  EEG classification: Normal awake and drowsy  Description of the recording: The background rhythms of this recording consists of a fairly well modulated medium amplitude alpha rhythm of 9-10 Hz that is reactive to eye opening and closure. As the record progresses, the patient appears to remain in the waking state throughout the recording. Photic stimulation was performed, did not show any abnormalities. Hyperventilation was not performed. Toward the end of the recording, the patient enters the drowsy state with slight symmetric slowing seen. The patient never enters stage II sleep. No abnormal epileptiform discharges seen during this recording. There was no focal slowing. EKG monitor shows no evidence of cardiac rhythm abnormalities with a heart rate of 60-65bpm.  Impression: This is a normal EEG recording in the waking and drowsy state. No evidence of interictal epileptiform discharges seen. A normal EEG does not exclude a diagnosis of epilepsy.    Alric Ran, MD [facility]

## 2021-08-09 NOTE — Telephone Encounter (Signed)
-----   Message from [name], MD sent at 08/09/2021  1:44 PM EDT ----- Please call and advise the patient that the EEG or brain wave test we performed was reported as normal in the awake and drowsy states. We checked for abnormal electrical discharges in the brain waves and the report suggested normal findings. No further action is required on this test at this time.

## 2021-08-09 NOTE — Telephone Encounter (Signed)
Pt returned phone call; informed pt of telephone note as instructed. Pt verbalized understand.

## 2021-08-10 ENCOUNTER — Telehealth: Payer: Self-pay

## 2021-08-10 DIAGNOSIS — F419 Anxiety disorder, unspecified: Secondary | ICD-10-CM | POA: Insufficient documentation

## 2021-08-10 NOTE — Telephone Encounter (Signed)
The patient called to report exacerbated anxiety and concerns that the Lexapro is not helping her at this time. Per the patient she has had two night time incidents of waves of anxiety. Per the patient she has taken the medication for five days. Per Dr. Melford Aase the patient should continue on Lexapro at this time and give the medication more time to work. He advised the patient to monitor her symptoms, call Dr. Norma Fredrickson at [phone], and to go to her local Emergency Room or call 911 if symptoms became concerning, worsened or new. The patient voiced verbal understanding to this staff member.

## 2021-08-10 NOTE — Progress Notes (Signed)
CPE  Assessment and Plan:  Encounter for Annual Physical Exam with abnormal findings Due annually  Health Maintenance reviewed Healthy lifestyle reviewed and goals set  Atherosclerosis of aorta ([place]) - per CT 06/2021 Control blood pressure, cholesterol, glucose, increase exercise.   Essential hypertension At goal; continue medication - olmesartan 10 mg for now, but reduce to 5 mg if having any hypotension once anxiety improves Monitor blood pressure at home; call if consistently over 150/0 Continue DASH diet.   Reminder to go to the ER if any CP, SOB, nausea, dizziness, severe HA, changes vision/speech, left arm numbness and tingling and jaw pain.  Complete RBBB Followed by cardiology; monitor   Paroxysmal SVT ([place]) Continue BB; no recurrence; cardiology follows  History of TIA (transient ischemic attack) Continue eliquis; follow up with neurology Control blood pressure, cholesterol, glucose, increase exercise.   Essential tremor  Continue follow up with neuro, on inderal, off of topamax and tapering off of klonopin;   Osteoporosis, unspecified osteoporosis type, unspecified pathological fracture presence Curently on drug break with hx of bisphosphonate tx; last DEXA showed stable T score; continue follow up with GYN, due this year Continue vitamin D supplementation, recommend weight bearing exercises  History of endometrial cancer S/p hysterectomy; continue follow up with GYN  Urethral prolapse S/p hysterectomy; continue follow up with GYN   HT CKD 3 ([place]) Family history of kidney failure in 2 siblings Bilateral renal cysts Increase fluids, avoid NSAIDS, monitor sugars, will monitor Nephrology is following  Vitamin D deficiency At goal; Continue supplementation  Hx/o DVT/PE Continue eliquis  No bleeding concerns, denies falls   Mixed hyperlipidemia Well controlled; continue statin Continue low cholesterol diet and exercise.  Check lipid panel q3-6  months  Other abnormal glucose Discussed disease and risks Discussed diet/exercise, weight management  Check A1C annually  B12 deficiency On supplement (sublingual); check B12 level  Personal history of skin cancer (basal cell) Followed by derm  Anxiety Continue lexapro 5 mg daily, bneefit anticipated over next 1-2 months Discussed and will give very low dose buspirone 5 mg TID PRN in the interim Discussed risk of SS, information given  Follow up in 2 months  Orders Placed This Encounter  Procedures   CBC with Differential/Platelet   COMPLETE METABOLIC PANEL WITH GFR   Magnesium   Lipid panel   TSH   VITAMIN D 25 Hydroxy (Vit-D Deficiency, Fractures)   Microalbumin / creatinine urine ratio   Urinalysis, Routine w reflex microscopic   Vitamin B12   Discussed med's effects and SE's. Screening labs and tests as requested with regular follow-up as recommended. Over 40 minutes of exam, counseling, chart review, and complex, high level critical decision making was performed this visit.   Future Appointments  Date Time Provider [place]  08/29/2021  4:00 PM [facility] [place] [facility]  10/02/2021 11:00 AM [name], NP [facility] None  10/19/2021 10:20 AM Fay Records, MD [facility]  11/15/2021  2:30 PM [name], NP [facility] None  01/10/2022  2:30 PM [name], NP [facility] None  08/14/2022  2:00 PM [name], NP [facility] None    Plan:   During the course of the visit the patient was educated and counseled about appropriate screening and preventive services including:   Pneumococcal vaccine  Prevnar 13 Influenza vaccine Td vaccine Screening electrocardiogram Bone densitometry screening Colorectal cancer screening Diabetes screening Glaucoma screening Nutrition counseling  Advanced directives: requested    HPI  87 y.o. female  presents for CPE  and follow up for has History of endometrial cancer; Hx/o DVT/PE;  Osteoporosis; Urethral prolapse; Essential hypertension; Hyperlipidemia, mixed; Abnormal glucose; Vitamin D deficiency; Essential tremor; Medication management; Chronic kidney disease, stage 3b ([place]); History of TIA (transient ischemic attack); B12 deficiency; History of skin cancer (basal cell); Complete right bundle branch block; Paroxysmal supraventricular tachycardia ([place]); Bilateral renal cysts; Pulmonary embolism ([place]); Aortic atherosclerosis ([place]) - CT 06/2021; and Anxiety on their problem list.   She is widowed, 1 living child. She has 2 grandchildren.   Hx of DVTs, recently found to have bilateral PEs on CTA 07/07/2021, hematology recommended indefinite NOAC unless bleeding contraindication.   The patient was admitted in 10/2017 for a L Brain TIA w/ transient expressive aphasia, workup unremarkable. She is followed by Neurology. She presented to ED on 07/07/2021 c/o tingling of left upper and lower extremity and hypertensive emergency. They found no appreciable deficits on exam, underwent unremarkable CT, MRI, stable/unchanged since 2018 with moderate nonspecific cerebral white matter signal changes.   She has followed with neuro for tremors, Dr. Rexene Alberts. Has been on propranolol, topamax, klonapin but tapering off due to sedation/ paresthesias. Currently 1/2 dose of klonopin with anxiety. Paresthesias did resolve. She had normal EEG (08/10/2021) and sleep study planned once off of klonopin.   Anxiety has been concern, she declined referral to psych, reports has seen in the past with limited benefit, anxiety worse with coming off of klonopin and just started lexapro 5 mg. She notes anxiety prior to Dr's appointments and in the evening.   She was also followed by Dr. Soundra Pilon (GYN) and Dr. Delilah Shan, goes annually who manages PAPs, MMG (at [facility]), DEXA. Hx of endometrial cancer s/p hysterectomy, urethral prolapse. She is currently on drug break from bisphosphonate for osteoporosis.   BMI is Body mass  index is 25.58 kg/m., she has been working on diet, admits very little exercise recently with weather. She reports has back pain with extended walking, will do short distances with neighbors when the weather permits. She has tai chi exercises that she is planning to restart. Wt Readings from Last 3 Encounters:  08/14/21 149 lb (67.6 kg)  07/31/21 147 lb (66.7 kg)  07/17/21 148 lb 6.4 oz (67.3 kg)   Her blood pressure has been controlled at home, today [name] is BP: 130/80 She does not workout. She denies chest pain, shortness of breath, dizziness. Olmesartan was increased to 10 mg due to labile hypertension with recent anxiety; doing well.   Follows with Dr. Harrington Challenger after evaluation for episodes of syncope; holter in 2019 showed short burst of SVT, now on propranolol, 2D echo showed LVEF 55-60% with grade 1 DD. Normal myocardial perfusion in 2019. No further episodes.  She as aortic atherosclerosis per CT 06/2021.   She is on cholesterol medication (atorvastatin 10 mg daily) and denies myalgias. Her cholesterol is at goal. The cholesterol last visit was:   Lab Results  Component Value Date   CHOL 117 04/10/2021   HDL 47 (L) 04/10/2021   LDLCALC 52 04/10/2021   TRIG 94 04/10/2021   CHOLHDL 2.5 04/10/2021   She has been working on diet and exercise for glucose management, she is on bASA, and denies foot ulcerations, increased appetite, nausea, paresthesia of the feet, polydipsia, polyuria, visual disturbances, vomiting and weight loss. Last A1C in the office was:  Lab Results  Component Value Date   HGBA1C 5.4 04/10/2021   [name], family history of kidney failure in brother and sister 77-80  fluid ounces, daily, no alcohol, no NSAIDs, no hematuria or stone hx US showed simple renal cysts, markedly small L kidney [facility] kidney [name] who recommends monitoring only at this time; follows annually Last GFR: Lab Results  Component Value Date   GFRNONAA 39 (L) 07/07/2021    GFRNONAA 38 (L) 06/16/2021   GFRNONAA 53 (L) 06/15/2021   Lab Results  Component Value Date   CREATININE 1.30 (H) 07/07/2021   CREATININE 1.33 (H) 07/07/2021   CREATININE 1.36 (H) 06/19/2021   Patient is on Vitamin D supplement   Lab Results  Component Value Date   VD25OH 88 04/10/2021     She is on supplement, taking 1000 mcg tab sublingual daily.  Lab Results  Component Value Date   [id number] 735 06/29/2020    Current Medications:  Current Outpatient Medications on File Prior to Visit  Medication Sig Dispense Refill   acetaminophen (TYLENOL) 500 MG tablet Take 500 mg by mouth every 6 (six) hours as needed for moderate pain.     apixaban (ELIQUIS) 5 MG TABS tablet Take  1 tablet 2 x/ day to Prevent Blood Clots 180 tablet 3   aspirin EC 81 MG tablet Take 1 tablet (81 mg total) by mouth daily. Swallow whole. 90 tablet 3   atorvastatin (LIPITOR) 10 MG tablet Take 1 tablet Daily for Cholesterol 90 tablet 3   Calcium-Magnesium-Vitamin D 300-20-200 MG-MG-UNIT CHEW Chew 1 tablet daily by mouth.      Cholecalciferol (VITAMIN D3) 5000 units TABS Take 2 tablets by mouth daily.      clonazePAM (KLONOPIN) 0.5 MG tablet Take 0.5 tablets (0.25 mg total) by mouth 3 (three) times daily. Please note change in dosing. 45 tablet 1   escitalopram (LEXAPRO) 5 MG tablet Take  1 tablet  Daily  for Chronic Anxiety 90 tablet 1   Magnesium 250 MG TABS Take 250 mg by mouth 2 (two) times a day.     propranolol (INDERAL) 40 MG tablet TAKE 1 TABLET BY MOUTH FOUR TIMES DAILY WITH MEALS AND BEDTIME FOR BLOOD PRESSURE AND TREMOR 360 tablet 3   vitamin B-12 (CYANOCOBALAMIN) 500 MCG tablet Take 500 mcg by mouth daily. Sublingual     No current facility-administered medications on file prior to visit.   Allergies:  Allergies  Allergen Reactions   Codeine Hives   Hctz [Hydrochlorothiazide] Other (See Comments)    Low sodium   Meloxicam Nausea Only    Gastritis and nausea   Mysoline [Primidone]     Pneumovax 23 [Pneumococcal Vac Polyvalent]     Red, raised area.  Knot at site of injection   Toradol [Ketorolac Tromethamine] Swelling    Localized redness at injection site.     Ace Inhibitors Cough   Medical History:  She has History of endometrial cancer; Hx/o DVT/PE; Osteoporosis; Urethral prolapse; Essential hypertension; Hyperlipidemia, mixed; Abnormal glucose; Vitamin D deficiency; Essential tremor; Medication management; Chronic kidney disease, stage 3b ([place]); History of TIA (transient ischemic attack); B12 deficiency; History of skin cancer (basal cell); Complete right bundle branch block; Paroxysmal supraventricular tachycardia ([employer]); Bilateral renal cysts; Pulmonary embolism ([place]); Aortic atherosclerosis ([place]) - CT 06/2021; and Anxiety on their problem list. Health Maintenance:   Immunization History  Administered Date(s) Administered   H1N1 01/03/2009   Influenza Whole 09/23/2013   Influenza, High Dose Seasonal PF 09/06/2014, 08/23/2015, 08/21/2016, 08/28/2017, 09/01/2018, 09/14/2019, 09/19/2020   PFIZER(Purple Top)SARS-COV-2 Vaccination 12/29/2019, 01/16/2020, 09/06/2020, 05/04/2021   PPD Test 03/29/2014   Pneumococcal Polysaccharide-23 03/29/2014  Pneumococcal-Unspecified 12/09/2002   Td 03/23/2013   Zoster, Live 12/09/2006    Preventative care: Last colonoscopy: 2011 DONE MGM 12/06/2020 DEXA 2016, 2018, 2020 T-2.8  - by Dr. Phineas Real, Osteoporosis, on break from bisphosphonates ,will get this year by GYN, may stop following next year, will plan to switch to breast center PAP/pelvic: 10/2018 negative, Dr. Phineas Real  Influenza 2021 TD 2014 Pneumonia 2015 prevnar 13 has allergy - none further Zoster 2008 Covid 19: 3/3, 2021, pfizer  Names of Other Physician/Practitioners you currently use: 1. [facility] here for primary care 2. Dr Rutherford Guys, eye doctor, last visit 2022 3. Dr Milas Gain, DDS, dentist, last visit 2022 & every 4  months 4. Dr. Elvera Lennox, derm, last visit 2021, goes annually, hx of basal cell   Patient Care Team: Unk Pinto, MD as PCP - General (Internal Medicine) Fay Records, MD as PCP - Cardiology (Cardiology) Ronald Lobo, MD as Consulting Physician (Gastroenterology) Rutherford Guys, MD as Consulting Physician (Ophthalmology) Clance, Armando Reichert, MD as Consulting Physician (Pulmonary Disease) Fontaine, Belinda Block, MD (Inactive) as Consulting Physician (Gynecology) Love, Alyson Locket, MD as Consulting Physician (Neurology) [name], MD as Attending Physician (Neurology) Harriett Sine, MD as Consulting Physician (Dermatology)  Surgical History:  She has a past surgical history that includes Cesarean section (1961); Tonsillectomy and adenoidectomy; Appendectomy (1961); Total abdominal hysterectomy w/ bilateral salpingoophorectomy (1997); and cataract surg (Bilateral, 2021). Family History:  Herfamily history includes Blindness (age of onset: 74) in her sister; Cancer in her father and mother; Dementia in her mother; Diabetes in her sister; Heart disease in her brother, brother, brother, sister, and sister; Hypertension in her sister; Kidney failure in her brother and sister; Prostate cancer in her brother and brother; Skin cancer in her sister. Social History:  She reports that she has never smoked. She has never used smokeless tobacco. She reports that she does not currently use alcohol. She reports that she does not use drugs.   Review of Systems: Review of Systems  Constitutional:  Negative for malaise/fatigue and weight loss.  HENT:  Negative for hearing loss and tinnitus.   Eyes:  Negative for blurred vision and double vision.  Respiratory:  Negative for cough, sputum production, shortness of breath and wheezing.   Cardiovascular:  Negative for chest pain, palpitations, orthopnea, claudication, leg swelling and PND.  Gastrointestinal:  Negative for abdominal pain, blood in stool,  constipation, diarrhea, heartburn, melena, nausea and vomiting.  Genitourinary: Negative.   Musculoskeletal:  Negative for falls, joint pain and myalgias.  Skin:  Negative for rash.  Neurological:  Positive for tremors (Baseline, bilateral hands). Negative for dizziness, tingling, sensory change, weakness and headaches.  Endo/Heme/Allergies:  Negative for polydipsia.  Psychiatric/Behavioral:  Negative for depression, memory loss, substance abuse and suicidal ideas. The patient is nervous/anxious. The patient does not have insomnia.   All other systems reviewed and are negative.  Physical Exam: Estimated body mass index is 25.58 kg/m as calculated from the following:   Height as of this encounter: '5\' 4"'  (1.626 m).   Weight as of this encounter: 149 lb (67.6 kg). BP 130/80   Pulse (!) 56   Temp (!) 97 F (36.1 C)   Ht '5\' 4"'  (1.626 m)   Wt 149 lb (67.6 kg)   SpO2 99%   BMI 25.58 kg/m  General Appearance: Well nourished, in no apparent distress.  Eyes: PERRLA, EOMs, conjunctiva no swelling or erythema Sinuses: No Frontal/maxillary tenderness  ENT/Mouth: Ext aud canal with  cerumen. Good dentition. No erythema, swelling, or exudate on post pharynx. Tonsils not swollen or erythematous. Hearing normal.  Neck: Supple, thyroid normal. No bruits  Respiratory: Respiratory effort normal, BS equal bilaterally without rales, rhonchi, wheezing or stridor.  Cardio: RRR without murmurs, rubs or gallops. Brisk peripheral pulses without edema.  Chest: symmetric, with normal excursions and percussion.  Breasts: Defer to GYN  Abdomen: Soft, nontender, no guarding, rebound, hernias, masses, or organomegaly.  Lymphatics: Non tender without lymphadenopathy.  Genitourinary: Defer to GYN Musculoskeletal: Full ROM all peripheral extremities,5/5 strength, and normal gait. Mild kyphosis.   Skin: Warm, dry without rashes, concerning lesions, ecchymosis. Neuro: Cranial nerves intact, reflexes equal bilaterally.  Normal muscle tone, no cerebellar symptoms, subtle tremor of bilateral hands, head tremor, sensation intact bil to monofilament Psych: Awake and oriented X 3, mildly anxious affect, Insight and Judgment appropriate.   EKG: Reviewed recent from 07/07/2021 Loletha Grayer, RBBB (consistent from previous), no ST changes -cardiology also follows annually, defer  Izora Ribas 4:23 PM [facility]

## 2021-08-14 ENCOUNTER — Other Ambulatory Visit: Payer: Self-pay

## 2021-08-14 ENCOUNTER — Encounter: Payer: Self-pay | Admitting: Adult Health

## 2021-08-14 ENCOUNTER — Ambulatory Visit (INDEPENDENT_AMBULATORY_CARE_PROVIDER_SITE_OTHER): Payer: Medicare PPO | Admitting: Adult Health

## 2021-08-14 VITALS — BP 130/80 | HR 56 | Temp 97.0°F | Ht 64.0 in | Wt 149.0 lb

## 2021-08-14 DIAGNOSIS — Z79899 Other long term (current) drug therapy: Secondary | ICD-10-CM

## 2021-08-14 DIAGNOSIS — I700 Atherosclerosis of aorta: Secondary | ICD-10-CM

## 2021-08-14 DIAGNOSIS — E559 Vitamin D deficiency, unspecified: Secondary | ICD-10-CM

## 2021-08-14 DIAGNOSIS — I4510 Unspecified right bundle-branch block: Secondary | ICD-10-CM

## 2021-08-14 DIAGNOSIS — F419 Anxiety disorder, unspecified: Secondary | ICD-10-CM

## 2021-08-14 DIAGNOSIS — M810 Age-related osteoporosis without current pathological fracture: Secondary | ICD-10-CM

## 2021-08-14 DIAGNOSIS — N281 Cyst of kidney, acquired: Secondary | ICD-10-CM

## 2021-08-14 DIAGNOSIS — E538 Deficiency of other specified B group vitamins: Secondary | ICD-10-CM | POA: Diagnosis not present

## 2021-08-14 DIAGNOSIS — Z0000 Encounter for general adult medical examination without abnormal findings: Secondary | ICD-10-CM

## 2021-08-14 DIAGNOSIS — Z85828 Personal history of other malignant neoplasm of skin: Secondary | ICD-10-CM

## 2021-08-14 DIAGNOSIS — E782 Mixed hyperlipidemia: Secondary | ICD-10-CM | POA: Diagnosis not present

## 2021-08-14 DIAGNOSIS — Z8542 Personal history of malignant neoplasm of other parts of uterus: Secondary | ICD-10-CM

## 2021-08-14 DIAGNOSIS — I10 Essential (primary) hypertension: Secondary | ICD-10-CM | POA: Diagnosis not present

## 2021-08-14 DIAGNOSIS — R7309 Other abnormal glucose: Secondary | ICD-10-CM

## 2021-08-14 DIAGNOSIS — N1832 Chronic kidney disease, stage 3b: Secondary | ICD-10-CM | POA: Diagnosis not present

## 2021-08-14 DIAGNOSIS — Z1329 Encounter for screening for other suspected endocrine disorder: Secondary | ICD-10-CM

## 2021-08-14 DIAGNOSIS — Z8673 Personal history of transient ischemic attack (TIA), and cerebral infarction without residual deficits: Secondary | ICD-10-CM

## 2021-08-14 DIAGNOSIS — Z86718 Personal history of other venous thrombosis and embolism: Secondary | ICD-10-CM

## 2021-08-14 DIAGNOSIS — I2699 Other pulmonary embolism without acute cor pulmonale: Secondary | ICD-10-CM

## 2021-08-14 DIAGNOSIS — Z0001 Encounter for general adult medical examination with abnormal findings: Secondary | ICD-10-CM

## 2021-08-14 DIAGNOSIS — G250 Essential tremor: Secondary | ICD-10-CM

## 2021-08-14 DIAGNOSIS — I471 Supraventricular tachycardia: Secondary | ICD-10-CM

## 2021-08-14 MED ORDER — BUSPIRONE HCL 5 MG PO TABS: ORAL_TABLET | ORAL | 0 refills | Status: DC

## 2021-08-14 MED ORDER — OLMESARTAN MEDOXOMIL 20 MG PO TABS: ORAL_TABLET | ORAL | 3 refills | Status: DC

## 2021-08-14 NOTE — Patient Instructions (Signed)
Buspirone Tablets What is this medication? BUSPIRONE (byoo SPYE rone) treats anxiety. It works by balancing the levels of dopamine and serotonin in your brain, hormones that help regulate mood. This medicine may be used for other purposes; ask your health care provider or pharmacist if you have questions. COMMON BRAND NAME(S): BuSpar What should I tell my care team before I take this medication? They need to know if you have any of these conditions: Kidney or liver disease An unusual or allergic reaction to buspirone, other medications, foods, dyes, or preservatives Pregnant or trying to get pregnant Breast-feeding How should I use this medication? Take this medication by mouth with a glass of water. Follow the directions on the prescription label. You may take this medication with or without food. To ensure that this medication always works the same way for you, you should take it either always with or always without food. Take your doses at regular intervals. Do not take your medication more often than directed. Do not stop taking except on the advice of your care team. Talk to your care team about the use of this medication in children. Special care may be needed. Overdosage: If you think you have taken too much of this medicine contact a poison control center or emergency room at once. NOTE: This medicine is only for you. Do not share this medicine with others. What if I miss a dose? If you miss a dose, take it as soon as you can. If it is almost time for your next dose, take only that dose. Do not take double or extra doses. What may interact with this medication? Do not take this medication with any of the following: Linezolid MAOIs like Carbex, Eldepryl, Marplan, Nardil, and Parnate Methylene blue Procarbazine This medication may also interact with the following: Diazepam Digoxin Diltiazem Erythromycin Grapefruit juice Haloperidol Medications for mental depression or mood  problems Medications for seizures like carbamazepine, phenobarbital and phenytoin Nefazodone Other medications for anxiety Rifampin Ritonavir Some antifungal medications like itraconazole, ketoconazole, and voriconazole Verapamil Warfarin This list may not describe all possible interactions. Give your health care provider a list of all the medicines, herbs, non-prescription drugs, or dietary supplements you use. Also tell them if you smoke, drink alcohol, or use illegal drugs. Some items may interact with your medicine. What should I watch for while using this medication? Visit your care team for regular checks on your progress. It may take 1 to 2 weeks before your anxiety gets better. You may get drowsy or dizzy. Do not drive, use machinery, or do anything that needs mental alertness until you know how this medication affects you. Do not stand or sit up quickly, especially if you are an older patient. This reduces the risk of dizzy or fainting spells. Alcohol can make you more drowsy and dizzy. Avoid alcoholic drinks. What side effects may I notice from receiving this medication? Side effects that you should report to your care team as soon as possible: Allergic reactions-skin rash, itching, hives, swelling of the face, lips, tongue, or throat Irritability, confusion, fast or irregular heartbeat, muscle stiffness, twitching muscles, sweating, high fever, seizure, chills, vomiting, diarrhea, which may be signs of serotonin syndrome Side effects that usually do not require medical attention (report to your care team if they continue or are bothersome): Anxiety or nervousness Dizziness Drowsiness Headache Nausea Trouble sleeping This list may not describe all possible side effects. Call your doctor for medical advice about side effects. You may report side effects  to FDA at 1-800-FDA-1088. Where should I keep my medication? Keep out of the reach of children. Store at room temperature below  30 degrees C (86 degrees F). Protect from light. Keep container tightly closed. Throw away any unused medication after the expiration date. NOTE: This sheet is a summary. It may not cover all possible information. If you have questions about this medicine, talk to your doctor, pharmacist, or health care provider.  2022 Elsevier/Gold Standard (2021-02-21 09:59:46)   Please be aware that some of the medications that you are on can sometimes cause a rare and potentially dangerous adverse reaction, called SEROTONIN SYNDROME: Symptoms of this condition include (but are not limited to):  Agitation or restlessness, confusion, rapid heart rate and high blood pressure, dilated pupils, loss of muscle coordination or twitching muscles, muscle rigidity/stiffness, sweating and/or flushing, diarrhea, headache, shivering, goose bumps. If you have any of these symptoms you may have to stop the medication. Call your health care provider immediately.  Severe serotonin syndrome can be life-threatening emergency. Signs and symptoms of a severe reaction may include: high fever, seizures, irregular heartbeat, unconsciousness or altered level of awareness or personality changes.  If you have any of these new symptoms, call 911 or have someone take you to the emergency room.

## 2021-08-15 LAB — CBC WITH DIFFERENTIAL/PLATELET
Absolute Monocytes: 595 cells/uL (ref 200–950)
Basophils Absolute: 68 cells/uL (ref 0–200)
Basophils Relative: 1.1 %
Eosinophils Absolute: 118 cells/uL (ref 15–500)
Eosinophils Relative: 1.9 %
HCT: 44.2 % (ref 35.0–45.0)
Hemoglobin: 14.1 g/dL (ref 11.7–15.5)
Lymphs Abs: 1885 cells/uL (ref 850–3900)
MCH: 28.7 pg (ref 27.0–33.0)
MCHC: 31.9 g/dL — ABNORMAL LOW (ref 32.0–36.0)
MCV: 90 fL (ref 80.0–100.0)
MPV: 10.2 fL (ref 7.5–12.5)
Monocytes Relative: 9.6 %
Neutro Abs: 3534 cells/uL (ref 1500–7800)
Neutrophils Relative %: 57 %
Platelets: 358 10*3/uL (ref 140–400)
RBC: 4.91 10*6/uL (ref 3.80–5.10)
RDW: 14.4 % (ref 11.0–15.0)
Total Lymphocyte: 30.4 %
WBC: 6.2 10*3/uL (ref 3.8–10.8)

## 2021-08-15 LAB — COMPLETE METABOLIC PANEL WITH GFR
AG Ratio: 1.8 (calc) (ref 1.0–2.5)
ALT: 11 U/L (ref 6–29)
AST: 18 U/L (ref 10–35)
Albumin: 4 g/dL (ref 3.6–5.1)
Alkaline phosphatase (APISO): 31 U/L — ABNORMAL LOW (ref 37–153)
BUN/Creatinine Ratio: 17 (calc) (ref 6–22)
BUN: 20 mg/dL (ref 7–25)
CO2: 29 mmol/L (ref 20–32)
Calcium: 9.4 mg/dL (ref 8.6–10.4)
Chloride: 101 mmol/L (ref 98–110)
Creat: 1.21 mg/dL — ABNORMAL HIGH (ref 0.60–0.95)
Globulin: 2.2 g/dL (calc) (ref 1.9–3.7)
Glucose, Bld: 160 mg/dL — ABNORMAL HIGH (ref 65–99)
Potassium: 4.7 mmol/L (ref 3.5–5.3)
Sodium: 136 mmol/L (ref 135–146)
Total Bilirubin: 0.6 mg/dL (ref 0.2–1.2)
Total Protein: 6.2 g/dL (ref 6.1–8.1)
eGFR: 43 mL/min/{1.73_m2} — ABNORMAL LOW (ref >=60)

## 2021-08-15 LAB — URINALYSIS, ROUTINE W REFLEX MICROSCOPIC
Bilirubin Urine: NEGATIVE
Hgb urine dipstick: NEGATIVE
Ketones, ur: NEGATIVE
Leukocytes,Ua: NEGATIVE
Nitrite: NEGATIVE
Protein, ur: NEGATIVE
Specific Gravity, Urine: 1.005 (ref 1.001–1.035)
pH: 6 (ref 5.0–8.0)

## 2021-08-15 LAB — LIPID PANEL
Cholesterol: 114 mg/dL (ref <200)
HDL: 47 mg/dL — ABNORMAL LOW (ref >=50)
LDL Cholesterol (Calc): 50 mg/dL (calc)
Non-HDL Cholesterol (Calc): 67 mg/dL (calc) (ref <130)
Total CHOL/HDL Ratio: 2.4 (calc) (ref <5.0)
Triglycerides: 87 mg/dL (ref <150)

## 2021-08-15 LAB — MICROALBUMIN / CREATININE URINE RATIO
Creatinine, Urine: 34 mg/dL (ref 20–275)
Microalb Creat Ratio: 6 mcg/mg creat (ref <30)
Microalb, Ur: 0.2 mg/dL

## 2021-08-15 LAB — TSH: TSH: 1.24 mIU/L (ref 0.40–4.50)

## 2021-08-15 LAB — VITAMIN B12: Vitamin B-12: 612 pg/mL (ref 200–1100)

## 2021-08-15 LAB — VITAMIN D 25 HYDROXY (VIT D DEFICIENCY, FRACTURES): Vit D, 25-Hydroxy: 78 ng/mL (ref 30–100)

## 2021-08-15 LAB — MAGNESIUM: Magnesium: 2.3 mg/dL (ref 1.5–2.5)

## 2021-08-16 ENCOUNTER — Other Ambulatory Visit: Payer: Self-pay | Admitting: Adult Health

## 2021-08-16 DIAGNOSIS — G250 Essential tremor: Secondary | ICD-10-CM

## 2021-08-29 ENCOUNTER — Ambulatory Visit
Admission: RE | Admit: 2021-08-29 | Discharge: 2021-08-29 | Disposition: A | Discharge status: Home / Self Care | Payer: Medicare PPO | Source: Ambulatory Visit | Attending: Internal Medicine | Admitting: Internal Medicine

## 2021-08-29 ENCOUNTER — Other Ambulatory Visit (HOSPITAL_COMMUNITY)
Admission: RE | Admit: 2021-08-29 | Discharge: 2021-08-29 | Disposition: A | Discharge status: Home / Self Care | Payer: Medicare PPO | Source: Ambulatory Visit | Attending: Internal Medicine | Admitting: Internal Medicine

## 2021-08-29 DIAGNOSIS — E041 Nontoxic single thyroid nodule: Secondary | ICD-10-CM | POA: Diagnosis not present

## 2021-08-31 ENCOUNTER — Telehealth: Payer: Self-pay | Admitting: Student

## 2021-08-31 LAB — CYTOLOGY - NON PAP

## 2021-08-31 NOTE — Telephone Encounter (Signed)
Patient with recent right inferior thyroid nodule biopsy (08/29/21) by Dr. Kathlene Cote. Patient called today stating she feels mild soreness inside her throat on the right side along with mild decreased sensation on the right side of her tongue. She states these symptoms seem to be improving but she wanted to notify us. She states she is having no difficulties eating, drinking or breathing.   Patient advised to apply ice to procedure site and take OTC pain relievers if the symptoms are bothering her. She knows to seek care from her PCP if her symptoms do not improve in the next few days. She knows to go to the ED if she experiences difficulty breathing or swallowing.  Monica Curry, [place] [phone] 08/31/2021, 3:10 PM

## 2021-09-20 ENCOUNTER — Telehealth: Payer: Self-pay

## 2021-09-20 ENCOUNTER — Other Ambulatory Visit: Payer: Self-pay | Admitting: Adult Health

## 2021-09-20 NOTE — Telephone Encounter (Signed)
Patient called because she is 87 yo and wants to know if she still needs to have vaginal exam yearly. She has hx of TAH, BSO for adenocarcinoma. I suspect when Dr. Delilah Shan told her at her last visit that there was no need for Pap smears anymore that she confused this.

## 2021-09-21 NOTE — Telephone Encounter (Signed)
Monica Bruins, MD  Ramond Craver, RMA 10 hours ago (10:01 PM)   We can see her every 2 years for Breast exam and Pelvic exam.  Alternatively, her Fam MD could do those exams.    Spoke with patient, advised as seen below per Dr. Dellis Filbert. Patient will be due for BMD 12/22, BMD scheduled for 11/27/21 at 11am. Patient verbalizes understanding and is agreeable.   Encounter closed.

## 2021-09-24 ENCOUNTER — Other Ambulatory Visit: Payer: Self-pay

## 2021-09-24 MED ORDER — OLMESARTAN MEDOXOMIL 20 MG PO TABS: ORAL_TABLET | ORAL | 3 refills | Status: DC

## 2021-10-02 ENCOUNTER — Encounter: Payer: Self-pay | Admitting: Adult Health

## 2021-10-02 ENCOUNTER — Ambulatory Visit: Payer: Medicare PPO | Admitting: Adult Health

## 2021-10-02 ENCOUNTER — Ambulatory Visit (INDEPENDENT_AMBULATORY_CARE_PROVIDER_SITE_OTHER): Payer: Medicare PPO

## 2021-10-02 ENCOUNTER — Other Ambulatory Visit: Payer: Self-pay

## 2021-10-02 VITALS — BP 167/83 | HR 63 | Ht 64.0 in | Wt 150.0 lb

## 2021-10-02 VITALS — Temp 97.5°F

## 2021-10-02 DIAGNOSIS — Z23 Encounter for immunization: Secondary | ICD-10-CM

## 2021-10-02 DIAGNOSIS — G250 Essential tremor: Secondary | ICD-10-CM | POA: Diagnosis not present

## 2021-10-02 MED ORDER — GABAPENTIN 100 MG PO CAPS: 100.0000 mg | ORAL_CAPSULE | Freq: Every day | ORAL | 5 refills | Status: DC

## 2021-10-02 NOTE — Patient Instructions (Addendum)
Your Plan:  Try gabapentin 100 mg at bedtime for tremor  Thank you for coming to see [place] at [facility]. I hope we have been able to provide you high quality care today.  You may receive a patient satisfaction survey over the next few weeks. We would appreciate your feedback and comments so that we may continue to improve ourselves and the health of our patients.

## 2021-10-02 NOTE — Progress Notes (Addendum)
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  She returns today for follow-up.  At the last visit she was complaining of daytime sleepiness.  She was weaned off of Topamax and her Klonopin dose was decreased to half a tablet 3 times a day.  She reports that daytime sleepiness has improved.  She states that her reduction in clonazepam increased her anxiety.  Her PCP has placed her on BuSpar and Lexapro.  She feels that this is helped some.  The patient also feels that her tremor has gotten worse.  Right hand greater than left hand.  She has tried using weighted utensils and a weighted pen but does not find them helpful.  She notices it with her handwriting and with eating.  She would like to be on something to help with her tremor.  She does understand that most of the medication does have a side effect of drowsiness.  10/02/20: Monica Curry is an 87 year old female with a history of essential tremor.  She returns today for follow-up she continues on clonazepam and propranolol.  At the last visit Topamax 25 mg was added.  She states that this has been extremely beneficial.  She states that her daughter just recently visited and noticed the difference in her tremors.  The tremor primarily is in the hands right greater than left.  She sometimes will notice it with her handwriting.  She is able to complete all ADLs independently.  She returns today for an evaluation.   HISTORY 03/27/20:   Monica Curry is an 87 year old female with a history of essential tremor.  She returns today for follow-up.  She continues on clonazepam and propranolol.  She feels that her tremor has worsened.  She states that she has to hold a glass with 2 hands.  Her handwriting has gotten worse.  She did try to get a weighted glove but felt that it offered her  no benefit.  She is able to complete ADLs without help.  She returns today for an evaluation.    REVIEW OF SYSTEMS: Out of a complete 14 system review of symptoms, the patient complains only of the following symptoms, and all other reviewed systems are negative.  See HPI  ALLERGIES: Allergies  Allergen Reactions   Codeine Hives   Hctz [Hydrochlorothiazide] Other (See Comments)    Low sodium   Meloxicam Nausea Only    Gastritis and nausea   Mysoline [Primidone]    Pneumovax 23 [Pneumococcal Vac Polyvalent]     Red, raised area.  Knot at site of injection   Topiramate     Sleepiness, numbness feet tingling in arm.   Toradol [Ketorolac Tromethamine] Swelling    Localized redness at injection site.     Ace Inhibitors Cough    HOME MEDICATIONS: Outpatient Medications Prior to Visit  Medication Sig Dispense Refill   acetaminophen (TYLENOL) 500 MG tablet Take 500 mg by mouth every 6 (six) hours as needed for moderate pain.     apixaban (ELIQUIS) 5 MG TABS tablet Take  1 tablet 2 x/ day to Prevent Blood Clots 180 tablet 3   aspirin EC 81 MG tablet Take 1 tablet (81 mg total) by mouth daily. Swallow whole. 90 tablet 3   atorvastatin (LIPITOR) 10 MG tablet Take 1 tablet Daily for Cholesterol 90 tablet 3   busPIRone (  BUSPAR) 5 MG tablet TAKE 1 TABLET BY MOUTH UP TO THREE TIMES DAILY AS NEEDED FOR ANXIETY 90 tablet 0   Calcium-Magnesium-Vitamin D 300-20-200 MG-MG-UNIT CHEW Chew 1 tablet daily by mouth.      Cholecalciferol (VITAMIN D3) 5000 units TABS Take 2 tablets by mouth daily.      clonazePAM (KLONOPIN) 0.5 MG tablet Take 0.5 tablets (0.25 mg total) by mouth 3 (three) times daily. Please note change in dosing. 45 tablet 1   escitalopram (LEXAPRO) 5 MG tablet Take  1 tablet  Daily  for Chronic Anxiety 90 tablet 1   Magnesium 250 MG TABS Take 250 mg by mouth 2 (two) times a day.     olmesartan (BENICAR) 20 MG tablet Take 1/2 tab once daily for blood pressure goal <140/80. 45 tablet 3    propranolol (INDERAL) 40 MG tablet TAKE 1 TABLET BY MOUTH FOUR TIMES DAILY WITH MEALS AND BEDTIME FOR BLOOD PRESSURE AND TREMOR 360 tablet 3   vitamin B-12 (CYANOCOBALAMIN) 500 MCG tablet Take 500 mcg by mouth daily. Sublingual     No facility-administered medications prior to visit.    PAST MEDICAL HISTORY: Past Medical History:  Diagnosis Date   C. difficile colitis    Cancer [facility])    Endometrial cancer   Essential and other specified forms of tremor    Osteoporosis 11/2019   T score -2.8 stable from prior DEXA   Personal history of venous thrombosis and embolism 1997   Pulmonary embolus ([facility])    Shingles    Stroke ([place])    Thrush    TIA (transient ischemic attack)     PAST SURGICAL HISTORY: Past Surgical History:  Procedure Laterality Date   APPENDECTOMY  1961   cataract surg Bilateral 2021   Dr. Gershon Crane, R eye 8/18, L 8/25   CESAREAN SECTION  1961   TONSILLECTOMY AND ADENOIDECTOMY     TOTAL ABDOMINAL HYSTERECTOMY W/ BILATERAL SALPINGOOPHORECTOMY  1997    FAMILY HISTORY: Family History  Problem Relation Age of Onset   Cancer Mother        Unknown type   Dementia Mother    Cancer Father        Bone   Heart disease Brother    Prostate cancer Brother        Prostate   Heart disease Brother    Kidney failure Brother    Prostate cancer Brother        Prostate   Heart disease Sister    Blindness Sister 59       temporal arteritis   Skin cancer Sister        basal and melanoma   Heart disease Sister    Kidney failure Sister        Kidney transplant   Diabetes Sister    Hypertension Sister    Heart disease Brother     SOCIAL HISTORY: Social History   Socioeconomic History   Marital status: Widowed    Spouse name: Not on file   Number of children: 1   Years of education: Not on file   Highest education level: Not on file  Occupational History   Not on file  Tobacco Use   Smoking status: Never   Smokeless tobacco: Never  Vaping Use   Vaping Use:  Never used  Substance and Sexual Activity   Alcohol use: Not Currently    Alcohol/week: 0.0 standard drinks   Drug use: No   Sexual activity: Not Currently  Birth control/protection: Post-menopausal, Surgical    Comment: HYST-1st intercourse 87 yo-Fewer than 5 partners  Other Topics Concern   Not on file  Social History Narrative   Not on file   Social Determinants of Health   Financial Resource Strain: Not on file  Food Insecurity: Not on file  Transportation Needs: Not on file  Physical Activity: Not on file  Stress: Not on file  Social Connections: Not on file  Intimate Partner Violence: Not on file      PHYSICAL EXAM  Vitals:   10/02/21 1051  BP: (!) 167/83  Pulse: 63  Weight: 150 lb (68 kg)  Height: 5\' 4"  (1.626 m)   Body mass index is 25.75 kg/m.  Generalized: Well developed, in no acute distress   Neurological examination  Mentation: Alert oriented to time, place, history taking. Follows all commands speech and language fluent Cranial nerve II-XII: Pupils were equal round reactive to light. Extraocular movements were full, visual field were full on confrontational test. Facial sensation and strength were normal. Uvula tongue midline. Head turning and shoulder shrug  were normal and symmetric. Motor: The motor testing reveals 5 over 5 strength of all 4 extremities. Good symmetric motor tone is noted throughout.  Sensory: Sensory testing is intact to soft touch on all 4 extremities. No evidence of extinction is noted.  Intention tremor noted in the upper extremities right greater than left Coordination: Cerebellar testing reveals good finger-nose-finger and heel-to-shin bilaterally.  Gait and station: Gait is normal.  Reflexes: Deep tendon reflexes are symmetric and normal bilaterally.   DIAGNOSTIC DATA (LABS, IMAGING, TESTING) - I reviewed patient records, labs, notes, testing and imaging myself where available.  Lab Results  Component Value Date   WBC  6.2 08/14/2021   HGB 14.1 08/14/2021   HCT 44.2 08/14/2021   MCV 90.0 08/14/2021   PLT 358 08/14/2021      Component Value Date/Time   NA 136 08/14/2021 1458   K 4.7 08/14/2021 1458   CL 101 08/14/2021 1458   CO2 29 08/14/2021 1458   GLUCOSE 160 (H) 08/14/2021 1458   BUN 20 08/14/2021 1458   CREATININE 1.21 (H) 08/14/2021 1458   CALCIUM 9.4 08/14/2021 1458   PROT 6.2 08/14/2021 1458   ALBUMIN 3.6 07/07/2021 0158   AST 18 08/14/2021 1458   ALT 11 08/14/2021 1458   ALKPHOS 30 (L) 07/07/2021 0158   BILITOT 0.6 08/14/2021 1458   GFRNONAA 39 (L) 07/07/2021 0158   GFRNONAA 39 (L) 06/13/2021 1529   GFRAA 45 (L) 06/13/2021 1529   Lab Results  Component Value Date   CHOL 114 08/14/2021   HDL 47 (L) 08/14/2021   LDLCALC 50 08/14/2021   TRIG 87 08/14/2021   CHOLHDL 2.4 08/14/2021   Lab Results  Component Value Date   HGBA1C 5.4 04/10/2021   Lab Results  Component Value Date   VITAMINB12 612 08/14/2021   Lab Results  Component Value Date   TSH 1.24 08/14/2021      ASSESSMENT AND PLAN 87 y.o. year old female  has a past medical history of C. difficile colitis, Cancer ([place]), Essential and other specified forms of tremor, Osteoporosis (11/2019), Personal history of venous thrombosis and embolism (1997), Pulmonary embolus ([place]), Shingles, Stroke ([place]), Thrush, and TIA (transient ischemic attack). here with:  1. Essential Tremor  - Continue Propranolol 40 mg QID- prescribed by PCP - Continue Clonazepam 0.25 mg TID - Patient request to try another medication for tremors. Understands that most medication that  is indicated for tremors has a side effect of drowsiness. - Willing to Try gabapentin 100 mg at bedtime. Understands that drowsiness is a side effect. - Advised that if symptoms worsen or she develops new symptoms she should let us know - FU 6 months or sooner if needed    [name], MSN, NP-C 10/02/2021, 11:00 AM [facility] [address]  [place], [place] [address] [phone]   I reviewed the above note and documentation by the Nurse Practitioner and agree with the history, exam, assessment and plan as outlined above. I was available for consultation.  As discussed previously, we will work on slowly tapering her clonazepam off completely.  [name], MD, PhD [facility])

## 2021-10-18 NOTE — Progress Notes (Signed)
+   Cardiology Office Note   Date:  19/62/2297   ID:  Monica Curry, DOB [birth year], MRN [id number]  PCP:  Unk Pinto, MD  Cardiologist:   Dorris Carnes, MD   F/U of HTN     History of Present Illness: Monica Curry is a 87 y.o. female with a history of a TIA with expressive aphasia, HTN,  SVT (on event monitor; placed on diltiazem) CKD stage III, hx of pulmonary embolism, DVT, essential tremors and presyncope     I saw the pt in clinic in May 2022   She was admitted to th hosp in July 2022   SOB   Found to have a PE   Since hen she was seen by  Kathleen Argue since in Aug 2022 The pt says she has breathing OK   Really only had SOB afer her first PE 25 years ago  She denies CP   NO dizziness.     Current Meds  Medication Sig   acetaminophen (TYLENOL) 500 MG tablet Take 500 mg by mouth every 6 (six) hours as needed for moderate pain.   apixaban (ELIQUIS) 5 MG TABS tablet Take  1 tablet 2 x/ day to Prevent Blood Clots   aspirin EC 81 MG tablet Take 1 tablet (81 mg total) by mouth daily. Swallow whole.   atorvastatin (LIPITOR) 10 MG tablet Take 1 tablet Daily for Cholesterol   busPIRone (BUSPAR) 5 MG tablet TAKE 1 TABLET BY MOUTH UP TO THREE TIMES DAILY AS NEEDED FOR ANXIETY   Calcium-Magnesium-Vitamin D 300-20-200 MG-MG-UNIT CHEW Chew 1 tablet daily by mouth.    Cholecalciferol (VITAMIN D3) 5000 units TABS Take 2 tablets by mouth daily.    clonazePAM (KLONOPIN) 0.5 MG tablet Take 0.5 tablets (0.25 mg total) by mouth 3 (three) times daily. Please note change in dosing.   escitalopram (LEXAPRO) 5 MG tablet Take  1 tablet  Daily  for Chronic Anxiety   gabapentin (NEURONTIN) 100 MG capsule Take 1 capsule (100 mg total) by mouth at bedtime.   Magnesium 250 MG TABS Take 250 mg by mouth 2 (two) times a day.   olmesartan (BENICAR) 20 MG tablet Take 1/2 tab once daily for blood pressure goal <140/80.   propranolol (INDERAL) 40 MG tablet TAKE 1 TABLET BY MOUTH FOUR TIMES DAILY WITH MEALS AND  BEDTIME FOR BLOOD PRESSURE AND TREMOR   vitamin B-12 (CYANOCOBALAMIN) 500 MCG tablet Take 500 mcg by mouth daily. Sublingual     Allergies:   Codeine, Hctz [hydrochlorothiazide], Meloxicam, Mysoline [primidone], Pneumovax 23 [pneumococcal vac polyvalent], Topiramate, Toradol [ketorolac tromethamine], and Ace inhibitors   Past Medical History:  Diagnosis Date   C. difficile colitis    Cancer ([place])    Endometrial cancer   Essential and other specified forms of tremor    Osteoporosis 11/2019   T score -2.8 stable from prior DEXA   Personal history of venous thrombosis and embolism 1997   Pulmonary embolus ([place])    Shingles    Stroke ([place])    Thrush    TIA (transient ischemic attack)     Past Surgical History:  Procedure Laterality Date   APPENDECTOMY  1961   cataract surg Bilateral 2021   Dr. Gershon Crane, R eye 8/18, L 8/25   [place]   TONSILLECTOMY AND ADENOIDECTOMY     TOTAL ABDOMINAL HYSTERECTOMY W/ BILATERAL SALPINGOOPHORECTOMY  1997     Social History:  The patient  reports that she has never smoked. She has  never used smokeless tobacco. She reports that she does not currently use alcohol. She reports that she does not use drugs.   Family History:  The patient's family history includes Blindness (age of onset: 33) in her sister; Cancer in her father and mother; Dementia in her mother; Diabetes in her sister; Heart disease in her brother, brother, brother, sister, and sister; Hypertension in her sister; Kidney failure in her brother and sister; Prostate cancer in her brother and brother; Skin cancer in her sister.    ROS:  Please see the history of present illness. All other systems are reviewed and  Negative to the above problem except as noted.    PHYSICAL EXAM: VS:  BP 130/68   Pulse 70   Ht 5\' 4"  (1.626 m)   Wt 148 lb 3.2 oz (67.2 kg)   SpO2 97%   BMI 25.44 kg/m   GEN: Well nourished, well developed in no acute distress  HEENT: normal  Neck: no JVD,  no carotid bruits Cardiac: RRR; no murmur.  No LE edema  Respiratory:  clear to auscultation bilaterally GI: soft, nontender, nondistended, + BS  No hepatomegaly  MS: no deformity Moving all extremities   Skin: warm and dry Neuro:  Strength and sensation are intact Psych: euthymic mood, full affect   EKG:  EKG is  not ordered today.  Lipid Panel    Component Value Date/Time   CHOL 114 08/14/2021 1458   TRIG 87 08/14/2021 1458   HDL 47 (L) 08/14/2021 1458   CHOLHDL 2.4 08/14/2021 1458   VLDL 24 10/24/2017 0629   LDLCALC 50 08/14/2021 1458      Wt Readings from Last 3 Encounters:  10/19/21 148 lb 3.2 oz (67.2 kg)  10/02/21 150 lb (68 kg)  08/14/21 149 lb (67.6 kg)    Additional studies/ records that were reviewed today include:    Stress test 04/2018 Nuclear stress EF: 76%. There was no ST segment deviation noted during stress. No T wave inversion was noted during stress. The study is normal. This is a low risk study. The left ventricular ejection fraction is hyperdynamic (>65%).   Low risk stress nuclear study with normal perfusion and normal left ventricular regional and global systolic function.   Monitor 01/2018 NSR with sinus bradycardia 2. Atrial tachycardia at 160 is present 3. Rare PVC's 4. Noise artifact   Echo 10/2017 Study Conclusions   - Left ventricle: The cavity size was normal. Systolic function was   normal. The estimated ejection fraction was in the range of 55%   to 60%. Wall motion was normal; there were no regional wall   motion abnormalities. Doppler parameters are consistent with   abnormal left ventricular relaxation (grade 1 diastolic   dysfunction). - Mitral valve: There was mild regurgitation. - Left atrium: The atrium was moderately dilated.   Impressions:   - No cardiac source of emboli was indentified.    ASSESSMENT AND PLAN:  1   HTN  BP is controlled  2  HL   Keep on lipitor  Recent labs LDL 50 and HDL 47     3   Hx SVT   No palpitaitons    4  Dizziness  Balance is OK     5   Hx TIA Keep on ASA and lipitor    6  Hx of PE  REpeat this summer   Seen in Onc   On Eliquis    7   CV dz  Keep on  statins   F/U in 9 months   Current medicines are reviewed at length with the patient today.  The patient does not have concerns regarding medicines.  Signed, Dorris Carnes, MD  10/19/2021 10:44 AM    [address], [place], [place]  [address] Phone: [phone]; Fax: [phone]

## 2021-10-19 ENCOUNTER — Ambulatory Visit: Payer: Medicare PPO | Admitting: Internal Medicine

## 2021-10-19 ENCOUNTER — Other Ambulatory Visit: Payer: Self-pay

## 2021-10-19 ENCOUNTER — Encounter: Payer: Self-pay | Admitting: Internal Medicine

## 2021-10-19 VITALS — BP 130/68 | HR 70 | Ht 64.0 in | Wt 148.2 lb

## 2021-10-19 DIAGNOSIS — I10 Essential (primary) hypertension: Secondary | ICD-10-CM | POA: Diagnosis not present

## 2021-10-19 NOTE — Patient Instructions (Addendum)
Medication Instructions:   Your physician recommends that you continue on your current medications as directed. Please refer to the Current Medication list given to you today.   *If you need a refill on your cardiac medications before your next appointment, please call your pharmacy*   Lab Work: [place]   If you have labs (blood work) drawn today and your tests are completely normal, you will receive your results only by: [place] (if you have MyChart) OR A paper copy in the mail If you have any lab test that is abnormal or we need to change your treatment, we will call you to review the results.   Testing/Procedures: NONE ORDERED  TODAY     Follow-Up: At [facility], you and your health needs are our priority.  As part of our continuing mission to provide you with exceptional heart care, we have created designated Provider Care Teams.  These Care Teams include your primary Cardiologist (physician) and Advanced Practice Providers (APPs -  Physician Assistants and Nurse Practitioners) who all work together to provide you with the care you need, when you need it.  We recommend signing up for the patient portal called "MyChart".  Sign up information is provided on this After Visit Summary.  MyChart is used to connect with patients for Virtual Visits (Telemedicine).  Patients are able to view lab/test results, encounter notes, upcoming appointments, etc.  Non-urgent messages can be sent to your provider as well.   To learn more about what you can do with MyChart, go to [email or web address].    Your next appointment:    9 month(s)  The format for your next appointment:   In Person  Provider:   Dorris Carnes, MD     Other Instructions

## 2021-10-22 ENCOUNTER — Other Ambulatory Visit: Payer: Self-pay | Admitting: *Deleted

## 2021-10-22 DIAGNOSIS — G250 Essential tremor: Secondary | ICD-10-CM

## 2021-10-22 MED ORDER — CLONAZEPAM 0.5 MG PO TABS: 0.2500 mg | ORAL_TABLET | Freq: Three times a day (TID) | ORAL | 1 refills | Status: DC

## 2021-11-03 ENCOUNTER — Other Ambulatory Visit: Payer: Self-pay | Admitting: Internal Medicine

## 2021-11-03 MED ORDER — DEXAMETHASONE 2 MG PO TABS: ORAL_TABLET | ORAL | 0 refills | Status: DC

## 2021-11-15 ENCOUNTER — Other Ambulatory Visit: Payer: Self-pay

## 2021-11-15 ENCOUNTER — Encounter: Payer: Self-pay | Admitting: Adult Health

## 2021-11-15 ENCOUNTER — Ambulatory Visit (INDEPENDENT_AMBULATORY_CARE_PROVIDER_SITE_OTHER): Payer: Medicare PPO | Admitting: Adult Health

## 2021-11-15 VITALS — BP 92/54 | HR 78 | Temp 97.3°F | Wt 145.0 lb

## 2021-11-15 DIAGNOSIS — U071 COVID-19: Secondary | ICD-10-CM | POA: Insufficient documentation

## 2021-11-15 DIAGNOSIS — F419 Anxiety disorder, unspecified: Secondary | ICD-10-CM

## 2021-11-15 DIAGNOSIS — I10 Essential (primary) hypertension: Secondary | ICD-10-CM

## 2021-11-15 MED ORDER — OLMESARTAN MEDOXOMIL 5 MG PO TABS: ORAL_TABLET | ORAL | 1 refills | Status: DC

## 2021-11-15 MED ORDER — ESCITALOPRAM OXALATE 5 MG PO TABS: ORAL_TABLET | ORAL | 1 refills | Status: DC

## 2021-11-15 NOTE — Patient Instructions (Addendum)
    Recommend stopping buspirone  Try moving lexapro to the evening - with dinner or bedtime   If tremors doing better - see with neuro with ok to try stopping gabapentin if lack of benefit   Take olmesartan only if blood pressure is above 140/80.

## 2021-11-15 NOTE — Progress Notes (Signed)
Assessment and Plan:  Monica Curry was seen today for follow-up.  Diagnoses and all orders for this visit:  Essential hypertension Hypotensive today, has had dizziness Taking 10 mg olemesartan currently  Has done well with PRN 5 mg if 140/80+, after discussion will resume this plan Given salty snack and water today prior to departure. She is able to ambulate steadily, denies positional dizziness.  Monitor BPs closely at home, contact office if persistent low or high ED if syncope or LOC -     olmesartan (BENICAR) 5 MG tablet; Take 1 tab daily for blood pressure if reading above 140/80.  Chronic anxiety Improved; stop buspar, move lexapro to evening due to slightly increased tremors after taking Contact office if further concerns with this med -     escitalopram (LEXAPRO) 5 MG tablet; Take  1 tablet  Daily in the evening for Chronic Anxiety  COVID-19 (11/03/2021) Sx resolved;   Further disposition pending results of labs. Discussed med's effects and SE's.   Over 30 minutes of exam, counseling, chart review, and critical decision making was performed.   Future Appointments  Date Time Provider [place]  11/27/2021 11:00 AM [facility] DEXA RM 1 [facility] None  01/10/2022  2:30 PM [name], NP [facility] None  04/02/2022 11:00 AM [name], NP [facility] None  08/14/2022  2:00 PM [name], NP [facility] None    ------------------------------------------------------------------------------------------------------------------   HPI BP (!) 92/54   Pulse 78   Temp (!) 97.3 F (36.3 C)   Wt 145 lb (65.8 kg)   SpO2 99%   BMI 24.89 kg/m  87 y.o.female presents for 3 month follow up on new start anxiety med and labile hypertension.   She reports was dx with covid 19 on 11/03/2021, mostly recovered, has had an episode of vertigo and AM dizziness.   She has followed with neuro for tremors, Dr. Rexene Alberts. Has been on propranolol, topamax, klonapin but tapered off/down  due to sedation/ paresthesias. Currently 1/2 dose of klonopin with anxiety. Paresthesias did resolve. She had normal EEG (08/10/2021).  Anxiety has been ongoing concern; she reported panic episodes after tapered down long term klonopin per neuro prescriber She was initiated on lexapro 5 mg and given buspar 5 mg TID in the short term. Today reports anxiety much improved but felt bad after taking buspar, as been tapering, now taking just once daily and notes improvement with each dose reduction. She does feel for a few hours after taking lexapro tremors may be slightly worse.   Her blood pressure has been controlled at home (reports had low on Sunday, skipped olmesartan, hasn't checked since and has been taking med), today [name] is BP: (!) 92/54  She denies chest pain, shortness of breath, has had has AM dizziness.    Past Medical History:  Diagnosis Date   C. difficile colitis    Cancer [facility])    Endometrial cancer   Essential and other specified forms of tremor    Osteoporosis 11/2019   T score -2.8 stable from prior DEXA   Personal history of venous thrombosis and embolism 1997   Pulmonary embolus (HCC)    Shingles    Stroke ([place])    Thrush    TIA (transient ischemic attack)      Allergies  Allergen Reactions   Codeine Hives   Hctz [Hydrochlorothiazide] Other (See Comments)    Low sodium   Meloxicam Nausea Only    Gastritis and nausea   Mysoline [Primidone]    Pneumovax  23 [Pneumococcal Vac Polyvalent]     Red, raised area.  Knot at site of injection   Topiramate     Sleepiness, numbness feet tingling in arm.   Toradol [Ketorolac Tromethamine] Swelling    Localized redness at injection site.     Ace Inhibitors Cough    Current Outpatient Medications on File Prior to Visit  Medication Sig   acetaminophen (TYLENOL) 500 MG tablet Take 500 mg by mouth every 6 (six) hours as needed for moderate pain.   apixaban (ELIQUIS) 5 MG TABS tablet Take  1 tablet 2 x/ day to Prevent  Blood Clots   aspirin EC 81 MG tablet Take 1 tablet (81 mg total) by mouth daily. Swallow whole.   atorvastatin (LIPITOR) 10 MG tablet Take 1 tablet Daily for Cholesterol   Calcium-Magnesium-Vitamin D 300-20-200 MG-MG-UNIT CHEW Chew 1 tablet daily by mouth.    Cholecalciferol (VITAMIN D3) 5000 units TABS Take 2 tablets by mouth daily.    clonazePAM (KLONOPIN) 0.5 MG tablet Take 0.5 tablets (0.25 mg total) by mouth 3 (three) times daily. Please note change in dosing.   gabapentin (NEURONTIN) 100 MG capsule Take 1 capsule (100 mg total) by mouth at bedtime.   Magnesium 250 MG TABS Take 250 mg by mouth 2 (two) times a day.   propranolol (INDERAL) 40 MG tablet TAKE 1 TABLET BY MOUTH FOUR TIMES DAILY WITH MEALS AND BEDTIME FOR BLOOD PRESSURE AND TREMOR   vitamin B-12 (CYANOCOBALAMIN) 500 MCG tablet Take 500 mcg by mouth daily. Sublingual   No current facility-administered medications on file prior to visit.    ROS: all negative except above.   Physical Exam:  BP (!) 92/54   Pulse 78   Temp (!) 97.3 F (36.3 C)   Wt 145 lb (65.8 kg)   SpO2 99%   BMI 24.89 kg/m   General Appearance: Well nourished, in no apparent distress. Eyes: PERRLA, conjunctiva no swelling or erythema ENT/Mouth: mask in place; Hearing normal.  Neck: Supple, thyroid normal.  Respiratory: Respiratory effort normal, BS equal bilaterally without rales, rhonchi, wheezing or stridor.  Cardio: RRR with no MRGs. Quiet heart sounds. Intact peripheral pulses without edema.  Abdomen: Soft, + BS.  Non tender, no guarding, rebound, hernias, masses. Lymphatics: Non tender without lymphadenopathy.  Musculoskeletal: no obvious deformity; normal gait.  Skin: Warm, dry without rashes, lesions, ecchymosis.  Neuro: Normal muscle tone. Mild resting tremor of face.  Psych: Awake and oriented X 3, normal affect, Insight and Judgment appropriate.      Izora Ribas, NP 3:15 PM [facility]

## 2021-11-16 ENCOUNTER — Encounter: Payer: Self-pay | Admitting: Adult Health

## 2021-11-16 ENCOUNTER — Other Ambulatory Visit: Payer: Self-pay | Admitting: Adult Health

## 2021-11-16 DIAGNOSIS — I10 Essential (primary) hypertension: Secondary | ICD-10-CM

## 2021-11-16 MED ORDER — OLMESARTAN MEDOXOMIL 5 MG PO TABS: ORAL_TABLET | ORAL | 1 refills | Status: DC

## 2021-11-22 ENCOUNTER — Encounter: Payer: Self-pay | Admitting: Adult Health

## 2021-11-27 ENCOUNTER — Encounter (INDEPENDENT_AMBULATORY_CARE_PROVIDER_SITE_OTHER): Payer: Medicare PPO

## 2021-11-27 ENCOUNTER — Other Ambulatory Visit: Payer: Self-pay | Admitting: Obstetrics & Gynecology

## 2021-11-27 ENCOUNTER — Other Ambulatory Visit: Payer: Self-pay

## 2021-11-27 DIAGNOSIS — M810 Age-related osteoporosis without current pathological fracture: Secondary | ICD-10-CM

## 2021-11-27 DIAGNOSIS — Z780 Asymptomatic menopausal state: Secondary | ICD-10-CM | POA: Diagnosis not present

## 2021-11-28 DIAGNOSIS — D225 Melanocytic nevi of trunk: Secondary | ICD-10-CM | POA: Diagnosis not present

## 2021-11-28 DIAGNOSIS — L570 Actinic keratosis: Secondary | ICD-10-CM | POA: Diagnosis not present

## 2021-11-28 DIAGNOSIS — I8391 Asymptomatic varicose veins of right lower extremity: Secondary | ICD-10-CM | POA: Diagnosis not present

## 2021-11-28 DIAGNOSIS — D485 Neoplasm of uncertain behavior of skin: Secondary | ICD-10-CM | POA: Diagnosis not present

## 2021-11-28 DIAGNOSIS — L821 Other seborrheic keratosis: Secondary | ICD-10-CM | POA: Diagnosis not present

## 2021-11-28 DIAGNOSIS — Z85828 Personal history of other malignant neoplasm of skin: Secondary | ICD-10-CM | POA: Diagnosis not present

## 2021-11-28 DIAGNOSIS — L820 Inflamed seborrheic keratosis: Secondary | ICD-10-CM | POA: Diagnosis not present

## 2021-11-28 DIAGNOSIS — L986 Other infiltrative disorders of the skin and subcutaneous tissue: Secondary | ICD-10-CM | POA: Diagnosis not present

## 2021-12-05 ENCOUNTER — Telehealth: Payer: Self-pay | Admitting: Adult Health

## 2021-12-05 NOTE — Telephone Encounter (Signed)
I called pt.  She states that she wants to come off the gabapentin due to drowsiness/dizziness.  She is not sure if this is causing that or not, has not noted a discernible change in tremor.  I relayed that her dose id very low, gabapentin 100mg  caps.  She will come off taking every other day for 3 days and then stop.  She will call us back next week. I relayed will let MM/NP know as well.

## 2021-12-05 NOTE — Telephone Encounter (Signed)
Pt is asking for a call to discuss how to safely come off [id number]:LGKBOQUCLT (NEURONTIN) 100 MG capsule.  Pt feels it is affecting/interacting with her escitalopram (LEXAPRO) 5 MG tablet that she takes for her anxiety.  Pt feels the gabapentin makes her dizzy and causes drowsiness.  Please call.

## 2021-12-10 NOTE — Telephone Encounter (Signed)
Noted  

## 2021-12-17 ENCOUNTER — Encounter: Payer: Self-pay | Admitting: Adult Health

## 2021-12-19 ENCOUNTER — Other Ambulatory Visit: Payer: Self-pay

## 2021-12-19 ENCOUNTER — Ambulatory Visit: Payer: Medicare PPO | Admitting: Internal Medicine

## 2021-12-19 ENCOUNTER — Encounter: Payer: Self-pay | Admitting: Internal Medicine

## 2021-12-19 ENCOUNTER — Other Ambulatory Visit: Payer: Self-pay | Admitting: Adult Health

## 2021-12-19 VITALS — BP 128/78 | HR 72 | Temp 97.8°F | Resp 16 | Ht 64.0 in | Wt 145.6 lb

## 2021-12-19 DIAGNOSIS — M25561 Pain in right knee: Secondary | ICD-10-CM

## 2021-12-19 DIAGNOSIS — G250 Essential tremor: Secondary | ICD-10-CM

## 2021-12-19 MED ORDER — DEXAMETHASONE 2 MG PO TABS: ORAL_TABLET | ORAL | 0 refills | Status: DC

## 2021-12-19 NOTE — Telephone Encounter (Signed)
LMVM for pt concerning how she was doing with gabapentin dose and her sx of drowsiness.

## 2021-12-19 NOTE — Telephone Encounter (Signed)
Pt returned call , please call back   °

## 2021-12-19 NOTE — Progress Notes (Signed)
Future Appointments  Date Time Provider Department  12/19/2021  4:00 PM Unk Pinto, MD [facility]  01/10/2022  2:30 PM [name], NP [facility]  04/02/2022 11:00 AM [name], NP [facility]  08/14/2022  2:00 PM [name], NP [facility]    History of Present Illness:     The patient is a very nice 88 yo WWF who presents with c/o pains of the Rt knee w/o hx/o trauma/ injury. She relates no discomfort of the knee with sitting and only when stands or ambulates.    Medications   Current Outpatient Medications (Cardiovascular):    atorvastatin (LIPITOR) 10 MG tablet, Take 1 tablet Daily for Cholesterol   olmesartan (BENICAR) 5 MG tablet, Take 1 tab daily for blood pressure if reading above 140/80.   propranolol (INDERAL) 40 MG tablet, TAKE 1 TABLET BY MOUTH FOUR TIMES DAILY WITH MEALS AND BEDTIME FOR BLOOD PRESSURE AND TREMOR   Current Outpatient Medications (Analgesics):    acetaminophen (TYLENOL) 500 MG tablet, Take 500 mg by mouth every 6 (six) hours as needed for moderate pain.   aspirin EC 81 MG tablet, Take 1 tablet (81 mg total) by mouth daily. Swallow whole.  Current Outpatient Medications (Hematological):    apixaban (ELIQUIS) 5 MG TABS tablet, Take  1 tablet 2 x/ day to Prevent Blood Clots   vitamin B-12 (CYANOCOBALAMIN) 500 MCG tablet, Take 500 mcg by mouth daily. Sublingual  Current Outpatient Medications (Other):    Calcium-Magnesium-Vitamin D 300-20-200 MG-MG-UNIT CHEW, Chew 1 tablet daily by mouth.    Cholecalciferol (VITAMIN D3) 5000 units TABS, Take 2 tablets by mouth daily.    clonazePAM (KLONOPIN) 0.5 MG tablet, Take 0.5 tablets (0.25 mg total) by mouth 3 (three) times daily. Please note change in dosing.   escitalopram (LEXAPRO) 5 MG tablet, Take  1 tablet  Daily in the evening for Chronic Anxiety   gabapentin (NEURONTIN) 100 MG capsule, Take 1 capsule (100 mg total) by mouth at bedtime.   Magnesium 250 MG TABS, Take 250 mg by mouth 2 (two) times  a day.  Problem list She has History of endometrial cancer; Hx/o DVT/PE; Osteoporosis; Urethral prolapse; Essential hypertension; Hyperlipidemia, mixed; Abnormal glucose; Vitamin D deficiency; Essential tremor; Medication management; Chronic kidney disease, stage 3b ([place]); History of TIA (transient ischemic attack); B12 deficiency; History of skin cancer (basal cell); Complete right bundle branch block; Paroxysmal supraventricular tachycardia ([place]); Bilateral renal cysts; Pulmonary embolism ([place]); Aortic atherosclerosis ([place]) - CT 06/2021; Anxiety; and COVID-19 (11/03/2021) on their problem list.   Observations/Objective:  BP 128/78    Pulse 72    Temp 97.8 F (36.6 C)    Resp 16    Ht 5\' 4"  (1.626 m)    Wt 145 lb 9.6 oz (66 kg)    SpO2 97%    BMI 24.99 kg/m   Focused exam of knees finds Nl ROM w/o Crepitus. Hip internal/external rotation abduction is normal w/o restriction or limitation. Rt knee has  Nl ROM, flexion, extension with stability. No crepitus. No effusion or synovitis is evident . No calf tenderness is evident .   Assessment and Plan:   1. Acute pain of right knee  - dexamethasone 2 MG tablet;  Take 1 tab 3 x day for 5 days, then 2 x day for 5  days, then 1 tab daily    Dispense: 30 tablet   Follow Up Instructions:       I discussed the assessment and treatment plan with the patient. The  patient was provided an opportunity to ask questions and all were answered. The patient agreed with the plan and demonstrated an understanding of the instructions.       The patient was advised to call back or seek an in-person evaluation if the symptoms worsen or if the condition fails to improve as anticipated. Discussed referral to Orthopedist if discomfort persists.    Kirtland Bouchard, MD

## 2021-12-20 NOTE — Telephone Encounter (Signed)
Spoke with pt and she said she is off gabapentin now, her dizziness/drowsiness better, tremor same.  Needs her refill clonazepam

## 2021-12-25 DIAGNOSIS — H04123 Dry eye syndrome of bilateral lacrimal glands: Secondary | ICD-10-CM | POA: Diagnosis not present

## 2021-12-25 DIAGNOSIS — Z961 Presence of intraocular lens: Secondary | ICD-10-CM | POA: Diagnosis not present

## 2021-12-25 DIAGNOSIS — H524 Presbyopia: Secondary | ICD-10-CM | POA: Diagnosis not present

## 2021-12-31 DIAGNOSIS — Z1231 Encounter for screening mammogram for malignant neoplasm of breast: Secondary | ICD-10-CM | POA: Diagnosis not present

## 2021-12-31 LAB — HM MAMMOGRAPHY

## 2022-01-10 ENCOUNTER — Other Ambulatory Visit: Payer: Self-pay

## 2022-01-10 ENCOUNTER — Ambulatory Visit: Payer: Medicare PPO | Admitting: Adult Health

## 2022-01-10 ENCOUNTER — Encounter: Payer: Self-pay | Admitting: Adult Health

## 2022-01-10 VITALS — BP 128/66 | HR 65 | Temp 97.5°F | Wt 146.0 lb

## 2022-01-10 DIAGNOSIS — I700 Atherosclerosis of aorta: Secondary | ICD-10-CM

## 2022-01-10 DIAGNOSIS — N368 Other specified disorders of urethra: Secondary | ICD-10-CM

## 2022-01-10 DIAGNOSIS — E538 Deficiency of other specified B group vitamins: Secondary | ICD-10-CM

## 2022-01-10 DIAGNOSIS — M810 Age-related osteoporosis without current pathological fracture: Secondary | ICD-10-CM

## 2022-01-10 DIAGNOSIS — I10 Essential (primary) hypertension: Secondary | ICD-10-CM | POA: Diagnosis not present

## 2022-01-10 DIAGNOSIS — R7309 Other abnormal glucose: Secondary | ICD-10-CM | POA: Diagnosis not present

## 2022-01-10 DIAGNOSIS — R6889 Other general symptoms and signs: Secondary | ICD-10-CM | POA: Diagnosis not present

## 2022-01-10 DIAGNOSIS — Z0000 Encounter for general adult medical examination without abnormal findings: Secondary | ICD-10-CM

## 2022-01-10 DIAGNOSIS — Z0001 Encounter for general adult medical examination with abnormal findings: Secondary | ICD-10-CM

## 2022-01-10 DIAGNOSIS — E782 Mixed hyperlipidemia: Secondary | ICD-10-CM

## 2022-01-10 DIAGNOSIS — M25561 Pain in right knee: Secondary | ICD-10-CM

## 2022-01-10 DIAGNOSIS — I471 Supraventricular tachycardia, unspecified: Secondary | ICD-10-CM

## 2022-01-10 DIAGNOSIS — F419 Anxiety disorder, unspecified: Secondary | ICD-10-CM

## 2022-01-10 DIAGNOSIS — N281 Cyst of kidney, acquired: Secondary | ICD-10-CM | POA: Diagnosis not present

## 2022-01-10 DIAGNOSIS — E041 Nontoxic single thyroid nodule: Secondary | ICD-10-CM | POA: Insufficient documentation

## 2022-01-10 DIAGNOSIS — N1832 Chronic kidney disease, stage 3b: Secondary | ICD-10-CM | POA: Diagnosis not present

## 2022-01-10 DIAGNOSIS — Z85828 Personal history of other malignant neoplasm of skin: Secondary | ICD-10-CM | POA: Diagnosis not present

## 2022-01-10 DIAGNOSIS — G250 Essential tremor: Secondary | ICD-10-CM

## 2022-01-10 DIAGNOSIS — Z8542 Personal history of malignant neoplasm of other parts of uterus: Secondary | ICD-10-CM

## 2022-01-10 MED ORDER — DICLOFENAC SODIUM 1 % EX GEL: 2.0000 g | Freq: Four times a day (QID) | CUTANEOUS | 1 refills | Status: DC

## 2022-01-10 NOTE — Progress Notes (Signed)
AWV and FOLLOW UP  Assessment and Plan:  Annual Medicare Wellness Visit Due annually  Health maintenance reviewed  Atherosclerosis of aorta ([place]) - per CT 06/2021 Control blood pressure, cholesterol, glucose, increase exercise.   Essential hypertension At goal off of olmesartan - will d/c; continue propranolol  Monitor blood pressure at home; call if consistently over 150/90 Continue DASH diet.   Reminder to go to the ER if any CP, SOB, nausea, dizziness, severe HA, changes vision/speech, left arm numbness and tingling and jaw pain.  Complete RBBB Followed by cardiology; monitor   Paroxysmal SVT ([place]) Continue BB; no recurrence; cardiology follows  History of TIA (transient ischemic attack) Continue eliquis; follow up with neurology Control blood pressure, cholesterol, glucose, increase exercise.   Essential tremor  Continue follow up with neuro, on inderal, off of topamax and klonopin  Osteoporosis, unspecified osteoporosis type, unspecified pathological fracture presence Curently on drug break with hx of bisphosphonate tx; last DEXA showed stable T score; continue follow up with GYN, due this year Continue vitamin D supplementation, recommend weight bearing exercises  History of endometrial cancer S/p hysterectomy; continue follow up with GYN  Urethral prolapse S/p hysterectomy; continue follow up with GYN   HT CKD 3 ([place]) Family history of kidney failure in 2 siblings Bilateral renal cysts Increase fluids, avoid NSAIDS, monitor sugars, will monitor Nephrology is following  Vitamin D deficiency At goal; Continue supplementation  Hx/o DVT/PE Continue eliquis  No bleeding concerns, denies falls   Mixed hyperlipidemia Well controlled; continue statin Continue low cholesterol diet and exercise.  Check lipid panel q3-6 months  Other abnormal glucose Discussed disease and risks Discussed diet/exercise, weight management  Check A1C annually  B12 deficiency On  supplement (sublingual); check B12 level PRN  Personal history of skin cancer (basal cell) Followed by derm  Anxiety Well managed by current regimen; continue medications - lexapro 5 mg daily  Stress management techniques discussed, increase water, good sleep hygiene discussed, increase exercise, and increase veggies.     Orders Placed This Encounter  Procedures   CBC with Differential/Platelet   COMPLETE METABOLIC PANEL WITH GFR   Magnesium   Lipid panel   TSH   Discussed med's effects and SE's. Screening labs and tests as requested with regular follow-up as recommended. Over 40 minutes of exam, counseling, chart review, and complex, high level critical decision making was performed this visit.   Future Appointments  Date Time Provider [place]  04/02/2022 11:00 AM [name], NP [facility] None  05/15/2022  3:30 PM [name], NP [facility] None  08/28/2022  3:00 PM Magda Bernheim, NP [facility] None  01/14/2023  3:00 PM [name], NP [facility] None    Plan:   During the course of the visit the patient was educated and counseled about appropriate screening and preventive services including:   Pneumococcal vaccine  Prevnar 13 Influenza vaccine Td vaccine Screening electrocardiogram Bone densitometry screening Colorectal cancer screening Diabetes screening Glaucoma screening Nutrition counseling  Advanced directives: requested    HPI  88 y.o. female  presents for AWV and follow up for has History of endometrial cancer; Hx/o DVT/PE; Osteoporosis; Urethral prolapse; Essential hypertension; Hyperlipidemia, mixed; Abnormal glucose; Vitamin D deficiency; Essential tremor; Medication management; Chronic kidney disease, stage 3b ([place]); History of TIA (transient ischemic attack); B12 deficiency; History of skin cancer (basal cell); Complete right bundle branch block; Paroxysmal supraventricular tachycardia ([place]); Bilateral renal cysts; Aortic atherosclerosis  ([place]) - CT 06/2021; and Anxiety on their problem list.  She is widowed, 1 living child. She has 2 grandchildren.   She recently had thyroid nodule noted on imaging by cardiology, they ordered US 08/01/2021 that showed (TI-RADS 3) located in the inferior right thyroid lobe that was recommended for FNA, had this on 08/29/2021 showing Consistent with benign follicular nodule ([place] category II) and no further follow up was recommended.   Hx of DVTs, recently found to have bilateral PEs on CTA 07/07/2021, hematology recommended indefinite NOAC unless bleeding contraindication.   The patient was admitted in 10/2017 for a L Brain TIA w/ transient expressive aphasia, workup unremarkable. She is followed by Neurology. She presented to ED on 07/07/2021 c/o tingling of left upper and lower extremity and hypertensive emergency. They found no appreciable deficits on exam, underwent unremarkable CT, MRI, stable/unchanged since 2018 with moderate nonspecific cerebral white matter signal changes. She had normal EEG (08/10/2021).   She has followed with neuro for tremors, Dr. Rexene Alberts. Has been on propranolol, topamax, klonapin but tapered off/down topamax due to sedation/ paresthesias, sx did resolve. She now takes propranolol 40 mg QID and valium  Anxiety has been ongoing concern; she reported panic episodes after tapered down long term klonopin per neuro prescriber. Had seen psych in the past but with limited perceived benefit and stopped. She was initiated on lexapro 5 mg and given buspar 5 mg TID in the short term, sx improved and has tapered off of buspar.  She was also followed by Dr. Soundra Pilon (GYN) and Dr. Delilah Shan, goes annually who manages PAPs, MMG (at [facility]), DEXA. Hx of endometrial cancer s/p hysterectomy, urethral prolapse. She is currently on drug break from bisphosphonate for osteoporosis.   BMI is Body mass index is 25.06 kg/m., she has been working on diet, admits very little exercise recently with  weather and R knee pain (saw Dr. Melford Aase, didn't tolerate steroid, will try voltaren topical).  She has tai chi exercises that she is planning to restart. Wt Readings from Last 3 Encounters:  01/10/22 146 lb (66.2 kg)  12/19/21 145 lb 9.6 oz (66 kg)  11/15/21 145 lb (65.8 kg)   Her blood pressure has been controlled at home, today [name] is BP: 128/66 She does not workout. She denies chest pain, shortness of breath, dizziness. Olmesartan was increased to 10 mg due to labile hypertension with recent anxiety; doing well.   Follows with Dr. Harrington Challenger after evaluation for episodes of syncope; holter in 2019 showed short burst of SVT, now on propranolol, 2D echo showed LVEF 55-60% with grade 1 DD. Normal myocardial perfusion in 2019. No further episodes.  She as aortic atherosclerosis per CT 06/2021.   She is on cholesterol medication (atorvastatin 10 mg daily) and denies myalgias. Her cholesterol is at goal. The cholesterol last visit was:   Lab Results  Component Value Date   CHOL 114 08/14/2021   HDL 47 (L) 08/14/2021   LDLCALC 50 08/14/2021   TRIG 87 08/14/2021   CHOLHDL 2.4 08/14/2021   She has been working on diet and exercise for glucose management, she is on bASA, and denies foot ulcerations, increased appetite, nausea, paresthesia of the feet, polydipsia, polyuria, visual disturbances, vomiting and weight loss. Last A1C in the office was:  Lab Results  Component Value Date   HGBA1C 5.4 04/10/2021   [name], family history of kidney failure in brother and sister 75-80 fluid ounces, daily, no alcohol, no NSAIDs, no hematuria or stone hx US showed simple renal cysts, markedly small L kidney  Saw [place] [facility] [name] who recommends monitoring only at this time; follows annually, has upcoming in March 2023.  Last GFR: Lab Results  Component Value Date   GFRNONAA 39 (L) 07/07/2021   GFRNONAA 38 (L) 06/16/2021   GFRNONAA 53 (L) 06/15/2021   Lab Results  Component Value  Date   CREATININE 1.21 (H) 08/14/2021   CREATININE 1.30 (H) 07/07/2021   CREATININE 1.33 (H) 07/07/2021   Patient is on Vitamin D supplement   Lab Results  Component Value Date   VD25OH 78 08/14/2021     She is on supplement, taking 1000 mcg tab sublingual daily.  Lab Results  Component Value Date   [id number] 08/14/2021    Current Medications:  Current Outpatient Medications on File Prior to Visit  Medication Sig Dispense Refill   acetaminophen (TYLENOL) 500 MG tablet Take 500 mg by mouth every 6 (six) hours as needed for moderate pain.     apixaban (ELIQUIS) 5 MG TABS tablet Take  1 tablet 2 x/ day to Prevent Blood Clots 180 tablet 3   atorvastatin (LIPITOR) 10 MG tablet Take 1 tablet Daily for Cholesterol 90 tablet 3   Calcium-Magnesium-Vitamin D 300-20-200 MG-MG-UNIT CHEW Chew 1 tablet daily by mouth.      Cholecalciferol (VITAMIN D3) 5000 units TABS Take 2 tablets by mouth daily.      clonazePAM (KLONOPIN) 0.5 MG tablet Take 0.5 tablets (0.25 mg total) by mouth 3 (three) times daily. 45 tablet 0   escitalopram (LEXAPRO) 5 MG tablet Take  1 tablet  Daily in the evening for Chronic Anxiety 90 tablet 1   Magnesium 250 MG TABS Take 250 mg by mouth 2 (two) times a day.     propranolol (INDERAL) 40 MG tablet TAKE 1 TABLET BY MOUTH FOUR TIMES DAILY WITH MEALS AND BEDTIME FOR BLOOD PRESSURE AND TREMOR 360 tablet 3   vitamin B-12 (CYANOCOBALAMIN) 500 MCG tablet Take 500 mcg by mouth daily. Sublingual     No current facility-administered medications on file prior to visit.   Allergies:  Allergies  Allergen Reactions   Codeine Hives   Dexamethasone     anxiety   Hctz [Hydrochlorothiazide] Other (See Comments)    Low sodium   Meloxicam Nausea Only    Gastritis and nausea   Mysoline [Primidone]    Pneumovax 23 [Pneumococcal Vac Polyvalent]     Red, raised area.  Knot at site of injection   Prednisone     Anxiety   Topiramate     Sleepiness, numbness feet tingling in arm.    Toradol [Ketorolac Tromethamine] Swelling    Localized redness at injection site.     Ace Inhibitors Cough   Medical History:  She has History of endometrial cancer; Hx/o DVT/PE; Osteoporosis; Urethral prolapse; Essential hypertension; Hyperlipidemia, mixed; Abnormal glucose; Vitamin D deficiency; Essential tremor; Medication management; Chronic kidney disease, stage 3b ([place]); History of TIA (transient ischemic attack); B12 deficiency; History of skin cancer (basal cell); Complete right bundle branch block; Paroxysmal supraventricular tachycardia ([place]); Bilateral renal cysts; Aortic atherosclerosis ([place]) - CT 06/2021; and Anxiety on their problem list.  Health Maintenance:   Immunization History  Administered Date(s) Administered   H1N1 01/03/2009   Influenza Whole 09/23/2013   Influenza, High Dose Seasonal PF 09/06/2014, 08/23/2015, 08/21/2016, 08/28/2017, 09/01/2018, 09/14/2019, 09/19/2020, 10/02/2021   PFIZER(Purple Top)SARS-COV-2 Vaccination 12/29/2019, 01/16/2020, 09/06/2020, 05/04/2021   PPD Test 03/29/2014   Pneumococcal Polysaccharide-23 03/29/2014   Pneumococcal-Unspecified 12/09/2002   Td 03/23/2013   Zoster, Live  12/09/2006   Health Maintenance  Topic Date Due   MAMMOGRAM  12/06/2021   COVID-19 Vaccine (5 - Booster for Pfizer series) 01/26/2022 (Originally 06/29/2021)   TETANUS/TDAP  03/24/2023   INFLUENZA VACCINE  Completed   DEXA SCAN  Completed   HPV VACCINES  Aged Out   Pneumonia Vaccine 38+ Years old  Discontinued   Zoster Vaccines- Shingrix  Discontinued    Last colonoscopy: 2011 DONE MGM: annually [facility], just had 12/2021  DEXA 2016, 2018, 2020 T-2.8  - by Dr. Phineas Real, Osteoporosis, was on break for bisphosphonates, just had and reports was T-2.6, GYN follows, patient decided not to pursue prolia  PAP/pelvic: 10/2018 negative, Dr. Phineas Real  prevnar 13 has allergy - none further  Names of Other Physician/Practitioners you currently use: 1. [facility] here for primary care 2. Dr Rutherford Guys, eye doctor, last visit 2023 3. Dr Milas Gain, DDS, dentist, last visit 2023 & every 4 months 4. Dr. Elvera Lennox, derm, last visit 2022, goes annually, hx of basal cell   Patient Care Team: Unk Pinto, MD as PCP - General (Internal Medicine) Fay Records, MD as PCP - Cardiology (Cardiology) Ronald Lobo, MD as Consulting Physician (Gastroenterology) Rutherford Guys, MD as Consulting Physician (Ophthalmology) Clance, Armando Reichert, MD as Consulting Physician (Pulmonary Disease) Fontaine, Belinda Block, MD (Inactive) as Consulting Physician (Gynecology) Love, Alyson Locket, MD as Consulting Physician (Neurology) [name], MD as Attending Physician (Neurology) Harriett Sine, MD as Consulting Physician (Dermatology)  Surgical History:  She has a past surgical history that includes Cesarean section (1961); Tonsillectomy and adenoidectomy; Appendectomy (1961); Total abdominal hysterectomy w/ bilateral salpingoophorectomy (1997); and cataract surg (Bilateral, 2021). Family History:  Herfamily history includes Blindness (age of onset: 62) in her sister; Cancer in her father and mother; Dementia in her mother; Diabetes in her sister; Heart disease in her brother, brother, brother, sister, and sister; Hypertension in her sister; Kidney failure in her brother and sister; Prostate cancer in her brother and brother; Skin cancer in her sister. Social History:  She reports that she has never smoked. She has never used smokeless tobacco. She reports that she does not currently use alcohol. She reports that she does not use drugs.   Review of Systems: Review of Systems  Constitutional:  Negative for malaise/fatigue and weight loss.  HENT:  Negative for hearing loss and tinnitus.   Eyes:  Negative for blurred vision and double vision.  Respiratory:  Negative for cough, sputum production, shortness of breath and wheezing.   Cardiovascular:   Negative for chest pain, palpitations, orthopnea, claudication, leg swelling and PND.  Gastrointestinal:  Negative for abdominal pain, blood in stool, constipation, diarrhea, heartburn, melena, nausea and vomiting.  Genitourinary: Negative.   Musculoskeletal:  Negative for falls, joint pain and myalgias.  Skin:  Negative for rash.  Neurological:  Positive for tremors (Baseline, bilateral hands). Negative for dizziness, tingling, sensory change, weakness and headaches.  Endo/Heme/Allergies:  Negative for polydipsia.  Psychiatric/Behavioral:  Negative for depression, memory loss, substance abuse and suicidal ideas. The patient is nervous/anxious. The patient does not have insomnia.   All other systems reviewed and are negative.  Physical Exam: Estimated body mass index is 25.06 kg/m as calculated from the following:   Height as of 12/19/21: _0  (1.626 m).   Weight as of this encounter: 146 lb (66.2 kg). BP 128/66    Pulse 65    Temp (!) 97.5 F (36.4 C)    Wt 146 lb (  66.2 kg)    SpO2 99%    BMI 25.06 kg/m  General Appearance: Well nourished, in no apparent distress.  Eyes: PERRLA, EOMs, conjunctiva no swelling or erythema Sinuses: No Frontal/maxillary tenderness  ENT/Mouth: Ext aud canal with cerumen. Good dentition. No erythema, swelling, or exudate on post pharynx. Tonsils not swollen or erythematous. Hearing normal.  Neck: Supple, thyroid normal. No bruits  Respiratory: Respiratory effort normal, BS equal bilaterally without rales, rhonchi, wheezing or stridor.  Cardio: RRR without murmurs, rubs or gallops. Brisk peripheral pulses without edema.  Chest: symmetric, with normal excursions and percussion.  Breasts: Defer to GYN  Abdomen: Soft, nontender, no guarding, rebound, hernias, masses, or organomegaly.  Lymphatics: Non tender without lymphadenopathy.  Genitourinary: Defer to GYN Musculoskeletal: Full ROM all peripheral extremities,5/5 strength, and normal gait. Mild kyphosis.    Skin: Warm, dry without rashes, concerning lesions, ecchymosis. Neuro: Cranial nerves intact, reflexes equal bilaterally. Normal muscle tone, no cerebellar symptoms, subtle tremor of bilateral hands, head tremor, sensation intact bil to monofilament Psych: Awake and oriented X 3, mildly anxious affect, Insight and Judgment appropriate.   EKG: Reviewed recent from 07/07/2021 Loletha Grayer, RBBB (consistent from previous), no ST changes -cardiology also follows annually, defer  Izora Ribas 6:12 PM [facility]

## 2022-01-10 NOTE — Patient Instructions (Addendum)
°  Monica Curry , Thank you for taking time to come for your Medicare Wellness Visit. I appreciate your ongoing commitment to your health goals. Please review the following plan we discussed and let me know if I can assist you in the future.   These are the goals we discussed:  Goals      Blood Pressure < 150/90     DIET - INCREASE WATER INTAKE     Aim for 65 fluid ounces of water or clear liquids daily      Exercise 150 min/wk Moderate Activity     LDL CALC < 70        This is a list of the screening recommended for you and due dates:  Health Maintenance  Topic Date Due   Zoster (Shingles) Vaccine (1 of 2) Never done   Mammogram  12/06/2021   COVID-19 Vaccine (5 - Booster for Pfizer series) 01/26/2022*   Tetanus Vaccine  03/24/2023   Flu Shot  Completed   DEXA scan (bone density measurement)  Completed   HPV Vaccine  Aged Out   Pneumonia Vaccine  Discontinued  *Topic was postponed. The date shown is not the original due date.

## 2022-01-11 ENCOUNTER — Encounter: Payer: Self-pay | Admitting: Adult Health

## 2022-01-11 LAB — CBC WITH DIFFERENTIAL/PLATELET
Absolute Monocytes: 548 cells/uL (ref 200–950)
Basophils Absolute: 63 cells/uL (ref 0–200)
Basophils Relative: 1 %
Eosinophils Absolute: 189 cells/uL (ref 15–500)
Eosinophils Relative: 3 %
HCT: 46.9 % — ABNORMAL HIGH (ref 35.0–45.0)
Hemoglobin: 15.3 g/dL (ref 11.7–15.5)
Lymphs Abs: 1877 cells/uL (ref 850–3900)
MCH: 29 pg (ref 27.0–33.0)
MCHC: 32.6 g/dL (ref 32.0–36.0)
MCV: 89 fL (ref 80.0–100.0)
MPV: 10.3 fL (ref 7.5–12.5)
Monocytes Relative: 8.7 %
Neutro Abs: 3623 cells/uL (ref 1500–7800)
Neutrophils Relative %: 57.5 %
Platelets: 390 10*3/uL (ref 140–400)
RBC: 5.27 10*6/uL — ABNORMAL HIGH (ref 3.80–5.10)
RDW: 14.2 % (ref 11.0–15.0)
Total Lymphocyte: 29.8 %
WBC: 6.3 10*3/uL (ref 3.8–10.8)

## 2022-01-11 LAB — COMPLETE METABOLIC PANEL WITH GFR
AG Ratio: 1.7 (calc) (ref 1.0–2.5)
ALT: 13 U/L (ref 6–29)
AST: 18 U/L (ref 10–35)
Albumin: 4 g/dL (ref 3.6–5.1)
Alkaline phosphatase (APISO): 33 U/L — ABNORMAL LOW (ref 37–153)
BUN/Creatinine Ratio: 17 (calc) (ref 6–22)
BUN: 19 mg/dL (ref 7–25)
CO2: 33 mmol/L — ABNORMAL HIGH (ref 20–32)
Calcium: 9.5 mg/dL (ref 8.6–10.4)
Chloride: 102 mmol/L (ref 98–110)
Creat: 1.12 mg/dL — ABNORMAL HIGH (ref 0.60–0.95)
Globulin: 2.3 g/dL (calc) (ref 1.9–3.7)
Glucose, Bld: 99 mg/dL (ref 65–99)
Potassium: 4.6 mmol/L (ref 3.5–5.3)
Sodium: 138 mmol/L (ref 135–146)
Total Bilirubin: 0.6 mg/dL (ref 0.2–1.2)
Total Protein: 6.3 g/dL (ref 6.1–8.1)
eGFR: 47 mL/min/{1.73_m2} — ABNORMAL LOW (ref >=60)

## 2022-01-11 LAB — LIPID PANEL
Cholesterol: 114 mg/dL (ref <200)
HDL: 52 mg/dL (ref >=50)
LDL Cholesterol (Calc): 46 mg/dL (calc)
Non-HDL Cholesterol (Calc): 62 mg/dL (calc) (ref <130)
Total CHOL/HDL Ratio: 2.2 (calc) (ref <5.0)
Triglycerides: 81 mg/dL (ref <150)

## 2022-01-11 LAB — MAGNESIUM: Magnesium: 2 mg/dL (ref 1.5–2.5)

## 2022-01-11 LAB — TSH: TSH: 2.19 mIU/L (ref 0.40–4.50)

## 2022-01-15 ENCOUNTER — Other Ambulatory Visit: Payer: Self-pay | Admitting: Adult Health

## 2022-01-15 DIAGNOSIS — G250 Essential tremor: Secondary | ICD-10-CM

## 2022-01-15 NOTE — Telephone Encounter (Signed)
Received refill request for clonazepam.  Last OV was on 10/02/21.  Next OV is scheduled for 04/02/22 .  Last RX was written on 12/20/21 for 45 tabs. Have changed fill date to 01/18/22 for compliance.   [place] [facility] Database has been reviewed.

## 2022-01-23 ENCOUNTER — Encounter: Payer: Self-pay | Admitting: Internal Medicine

## 2022-01-28 ENCOUNTER — Encounter: Payer: Self-pay | Admitting: Adult Health

## 2022-01-29 ENCOUNTER — Other Ambulatory Visit: Payer: Self-pay | Admitting: Adult Health

## 2022-01-29 DIAGNOSIS — M25561 Pain in right knee: Secondary | ICD-10-CM

## 2022-02-07 ENCOUNTER — Other Ambulatory Visit: Payer: Self-pay | Admitting: Internal Medicine

## 2022-02-07 DIAGNOSIS — F419 Anxiety disorder, unspecified: Secondary | ICD-10-CM

## 2022-02-13 ENCOUNTER — Other Ambulatory Visit (HOSPITAL_COMMUNITY): Payer: Self-pay | Admitting: Orthopaedic Surgery

## 2022-02-13 ENCOUNTER — Other Ambulatory Visit: Payer: Self-pay

## 2022-02-13 ENCOUNTER — Ambulatory Visit (HOSPITAL_COMMUNITY)
Admission: RE | Admit: 2022-02-13 | Discharge: 2022-02-13 | Disposition: A | Discharge status: Home / Self Care | Payer: Medicare PPO | Source: Ambulatory Visit | Attending: Orthopaedic Surgery | Admitting: Orthopaedic Surgery

## 2022-02-13 DIAGNOSIS — M79604 Pain in right leg: Secondary | ICD-10-CM | POA: Diagnosis not present

## 2022-02-13 DIAGNOSIS — M25561 Pain in right knee: Secondary | ICD-10-CM | POA: Diagnosis not present

## 2022-02-18 ENCOUNTER — Other Ambulatory Visit: Payer: Self-pay | Admitting: Adult Health

## 2022-02-18 DIAGNOSIS — G250 Essential tremor: Secondary | ICD-10-CM

## 2022-02-19 NOTE — Telephone Encounter (Signed)
Last visit: 10/02/21 ?Next visit: 04/02/22 ?Checked [place] registry, last filled on 01/18/2022 ? ?Rx request sent to MM NP.  ?

## 2022-02-26 DIAGNOSIS — N1832 Chronic kidney disease, stage 3b: Secondary | ICD-10-CM | POA: Diagnosis not present

## 2022-03-07 DIAGNOSIS — N2581 Secondary hyperparathyroidism of renal origin: Secondary | ICD-10-CM | POA: Diagnosis not present

## 2022-03-07 DIAGNOSIS — D631 Anemia in chronic kidney disease: Secondary | ICD-10-CM | POA: Diagnosis not present

## 2022-03-07 DIAGNOSIS — N281 Cyst of kidney, acquired: Secondary | ICD-10-CM | POA: Diagnosis not present

## 2022-03-07 DIAGNOSIS — I129 Hypertensive chronic kidney disease with stage 1 through stage 4 chronic kidney disease, or unspecified chronic kidney disease: Secondary | ICD-10-CM | POA: Diagnosis not present

## 2022-03-07 DIAGNOSIS — N1831 Chronic kidney disease, stage 3a: Secondary | ICD-10-CM | POA: Diagnosis not present

## 2022-03-25 DIAGNOSIS — M25512 Pain in left shoulder: Secondary | ICD-10-CM | POA: Diagnosis not present

## 2022-04-02 ENCOUNTER — Ambulatory Visit: Payer: Medicare PPO | Admitting: Adult Health

## 2022-04-02 ENCOUNTER — Encounter: Payer: Self-pay | Admitting: Adult Health

## 2022-04-02 VITALS — BP 145/72 | HR 58 | Ht 64.0 in | Wt 144.0 lb

## 2022-04-02 DIAGNOSIS — G250 Essential tremor: Secondary | ICD-10-CM | POA: Diagnosis not present

## 2022-04-02 NOTE — Progress Notes (Addendum)
? ? 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 ?REASON FOR VISIT: follow up ?HISTORY FROM: patient ? ?HISTORY OF PRESENT ILLNESS: ?Today 04/02/22: ? ? Mr. Beever is a 88 year old female with a history of essential tremor.  She returns today for follow-up.  Feels that tremor is the same. Notices it with eating, writing. Has to use 2 hands when eating at times. Right hand worse than left. Tried Gabapentin but did not feel that it offered her any benefit. When she was on Topamax she noticed benefit but had side effects- sleeping issues and numbness in the toes. Thought she was having a CVA and went to the hospital but no stroke seen. Tried Primidone but couldn't tolerate.  She has only found clonazepam beneficial.  She does acknowledge that she has high anxiety.  For that reason she also has anxiety about reducing her dose of clonazepam. ?Twisted knee in January- using cane. Reports that she recently got injection in the left shoulder. ? ?10/02/21: Ms. Magistro is an 88 year old female with a history of essential tremor.  She returns today for follow-up.  At the last visit she was complaining of daytime sleepiness.  She was weaned off of Topamax and her Klonopin dose was decreased to half a tablet 3 times a day.  She reports that daytime sleepiness has improved.  She states that her reduction in clonazepam increased her anxiety.  Her PCP has placed her on BuSpar and Lexapro.  She feels that this is helped some.  The patient also feels that her tremor has gotten worse.  Right hand greater than left hand.  She has tried using weighted utensils and a weighted pen but does not find them helpful.  She notices it with her handwriting and with eating.  She would like to be on something to help with her tremor.  She does understand that most of the medication does have a side effect of drowsiness. ? ?10/02/20: Ms. Kaman is an 88 year old female with a history of essential tremor.  She returns today for follow-up she  continues on clonazepam and propranolol.  At the last visit Topamax 25 mg was added.  She states that this has been extremely beneficial.  She states that her daughter just recently visited and noticed the difference in her tremors.  The tremor primarily is in the hands right greater than left.  She sometimes will notice it with her handwriting.  She is able to complete all ADLs independently.  She returns today for an evaluation. ? ? ?HISTORY 03/27/20: ?  ?Ms. Gielow is an 88 year old female with a history of essential tremor.  She returns today for follow-up.  She continues on clonazepam and propranolol.  She feels that her tremor has worsened.  She states that she has to hold a glass with 2 hands.  Her handwriting has gotten worse.  She did try to get a weighted glove but felt that it offered her no benefit.  She is able to complete ADLs without help.  She returns today for an evaluation. ?  ? ?REVIEW OF SYSTEMS: Out of a complete 14 system review of symptoms, the patient complains only of the following symptoms, and all other reviewed systems are negative. ? ?See HPI ? ?ALLERGIES: ?Allergies  ?Allergen Reactions  ? Codeine Hives  ? Cortisone   ?  Causes jitteriness.  ? Dexamethasone   ?  anxiety  ? Hctz [Hydrochlorothiazide] Other (See Comments)  ?  Low sodium  ? Meloxicam Nausea Only  ?  Gastritis and nausea  ? Mysoline [Primidone]   ? Pneumovax 23 [Pneumococcal Vac Polyvalent]   ?  Red, raised area.  Knot at site of injection  ? Prednisone   ?  Anxiety  ? Topiramate   ?  Sleepiness, numbness feet tingling in arm.  ? Toradol [Ketorolac Tromethamine] Swelling  ?  Localized redness at injection site.    ? Ace Inhibitors Cough  ? ? ?HOME MEDICATIONS: ?Outpatient Medications Prior to Visit  ?Medication Sig Dispense Refill  ? acetaminophen (TYLENOL) 500 MG tablet Take 500 mg by mouth every 6 (six) hours as needed for moderate pain.    ? apixaban (ELIQUIS) 5 MG TABS tablet Take  1 tablet 2 x/ day to Prevent Blood  Clots 180 tablet 3  ? atorvastatin (LIPITOR) 10 MG tablet Take 1 tablet Daily for Cholesterol 90 tablet 3  ? Calcium-Magnesium-Vitamin D 300-20-200 MG-MG-UNIT CHEW Chew 1 tablet daily by mouth.     ? Cholecalciferol (VITAMIN D3) 5000 units TABS Take 2 tablets by mouth daily.     ? clonazePAM (KLONOPIN) 0.5 MG tablet TAKE 1/2 TABLET(0.25 MG) BY MOUTH THREE TIMES DAILY 45 tablet 1  ? escitalopram (LEXAPRO) 5 MG tablet TAKE 1 TABLET BY MOUTH DAILY FOR CHRONIC ANXIETY 90 tablet 1  ? Magnesium 250 MG TABS Take 250 mg by mouth 2 (two) times a day.    ? propranolol (INDERAL) 40 MG tablet TAKE 1 TABLET BY MOUTH FOUR TIMES DAILY WITH MEALS AND BEDTIME FOR BLOOD PRESSURE AND TREMOR 360 tablet 3  ? vitamin B-12 (CYANOCOBALAMIN) 500 MCG tablet Take 500 mcg by mouth daily. Sublingual    ? diclofenac Sodium (VOLTAREN) 1 % GEL Apply 2 g topically 4 (four) times daily. 350 g 1  ? ?No facility-administered medications prior to visit.  ? ? ?PAST MEDICAL HISTORY: ?Past Medical History:  ?Diagnosis Date  ? C. difficile colitis   ? Cancer [name])   ? Endometrial cancer  ? Essential and other specified forms of tremor   ? Osteoporosis 11/2019  ? T score -2.8 stable from prior DEXA  ? Personal history of venous thrombosis and embolism 1997  ? Pulmonary embolism ([place]) 06/14/2021  ? Pulmonary embolus ([place])   ? Shingles   ? Thrush   ? TIA (transient ischemic attack)   ? ? ?PAST SURGICAL HISTORY: ?Past Surgical History:  ?Procedure Laterality Date  ? APPENDECTOMY  1961  ? cataract surg Bilateral 2021  ? Dr. Gershon Crane, R eye 8/18, L 8/25  ? [place]  ? TONSILLECTOMY AND ADENOIDECTOMY    ? TOTAL ABDOMINAL HYSTERECTOMY W/ BILATERAL SALPINGOOPHORECTOMY  1997  ? ? ?FAMILY HISTORY: ?Family History  ?Problem Relation Age of Onset  ? Cancer Mother   ?     Unknown type  ? Dementia Mother   ? Cancer Father   ?     Bone  ? Heart disease Brother   ? Prostate cancer Brother   ?     Prostate  ? Heart disease Brother   ? Kidney failure Brother   ?  Prostate cancer Brother   ?     Prostate  ? Heart disease Sister   ? Blindness Sister 81  ?     temporal arteritis  ? Skin cancer Sister   ?     basal and melanoma  ? Heart disease Sister   ? Kidney failure Sister   ?     Kidney transplant  ? Diabetes Sister   ? Hypertension  Sister   ? Heart disease Brother   ? ? ?SOCIAL HISTORY: ?Social History  ? ?Socioeconomic History  ? Marital status: Widowed  ?  Spouse name: Not on file  ? Number of children: 1  ? Years of education: Not on file  ? Highest education level: Not on file  ?Occupational History  ? Not on file  ?Tobacco Use  ? Smoking status: Never  ? Smokeless tobacco: Never  ?Vaping Use  ? Vaping Use: Never used  ?Substance and Sexual Activity  ? Alcohol use: Not Currently  ?  Alcohol/week: 0.0 standard drinks  ? Drug use: No  ? Sexual activity: Not Currently  ?  Birth control/protection: Post-menopausal, Surgical  ?  Comment: HYST-1st intercourse 88 yo-Fewer than 5 partners  ?Other Topics Concern  ? Not on file  ?Social History Narrative  ? Not on file  ? ?Social Determinants of Health  ? ?Financial Resource Strain: Not on file  ?Food Insecurity: Not on file  ?Transportation Needs: Not on file  ?Physical Activity: Not on file  ?Stress: Not on file  ?Social Connections: Not on file  ?Intimate Partner Violence: Not on file  ? ? ? ? ?PHYSICAL EXAM ? ?Vitals:  ? 04/02/22 1101  ?BP: (!) 145/72  ?Pulse: (!) 58  ?Weight: 144 lb (65.3 kg)  ?Height: '5\' 4"'$  (1.626 m)  ? ?Body mass index is 24.72 kg/m?. ? ?Generalized: Well developed, in no acute distress  ? ?Neurological examination  ?Mentation: Alert oriented to time, place, history taking. Follows all commands speech and language fluent ?Cranial nerve II-XII: Pupils were equal round reactive to light. Extraocular movements were full, visual field were full on confrontational test. Facial sensation and strength were normal. Uvula tongue midline. Head turning and shoulder shrug  were normal and symmetric. ?Motor: The motor  testing reveals 5 over 5 strength of all 4 extremities. Good symmetric motor tone is noted throughout.  ?Sensory: Sensory testing is intact to soft touch on all 4 extremities. No evidence of extinctio

## 2022-04-04 ENCOUNTER — Other Ambulatory Visit: Payer: Self-pay | Admitting: Adult Health

## 2022-04-04 DIAGNOSIS — M6281 Muscle weakness (generalized): Secondary | ICD-10-CM | POA: Diagnosis not present

## 2022-04-04 DIAGNOSIS — G250 Essential tremor: Secondary | ICD-10-CM

## 2022-04-04 DIAGNOSIS — M25512 Pain in left shoulder: Secondary | ICD-10-CM | POA: Diagnosis not present

## 2022-04-04 DIAGNOSIS — I10 Essential (primary) hypertension: Secondary | ICD-10-CM

## 2022-04-09 DIAGNOSIS — M25512 Pain in left shoulder: Secondary | ICD-10-CM | POA: Diagnosis not present

## 2022-04-09 DIAGNOSIS — M6281 Muscle weakness (generalized): Secondary | ICD-10-CM | POA: Diagnosis not present

## 2022-04-12 DIAGNOSIS — M25512 Pain in left shoulder: Secondary | ICD-10-CM | POA: Diagnosis not present

## 2022-04-12 DIAGNOSIS — M6281 Muscle weakness (generalized): Secondary | ICD-10-CM | POA: Diagnosis not present

## 2022-04-14 IMAGING — CT CT HEAD W/O CM
3 series · 15 of 47 positions shown, 18 images · non-contrast
Comparison: 10/23/2017

CLINICAL DATA: Left arm and leg paresthesia

EXAM:
CT HEAD WITHOUT CONTRAST
TECHNIQUE: Contiguous axial images were obtained from the base of the skull
through the vertex without intravenous contrast.

[Series 3: head 5.0 h30s · axial · 0.41mm/px · z∈[-125,+5]mm · 9 of 32 slices shown, 12 images]
[im 3/32  brain]
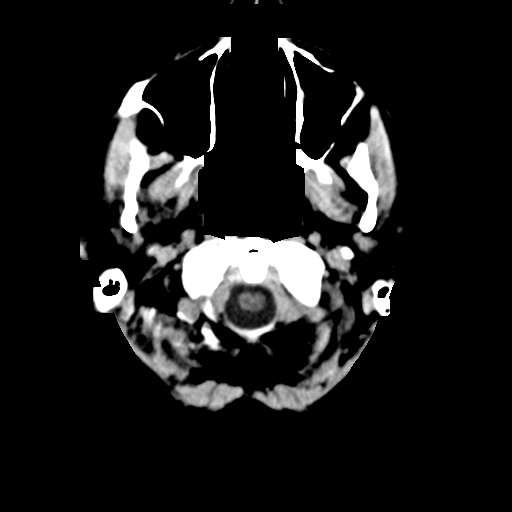
[im 3/32  bone]
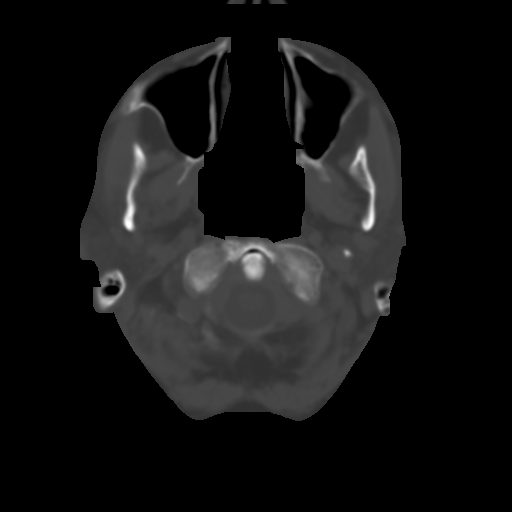
[im 6/32  brain]
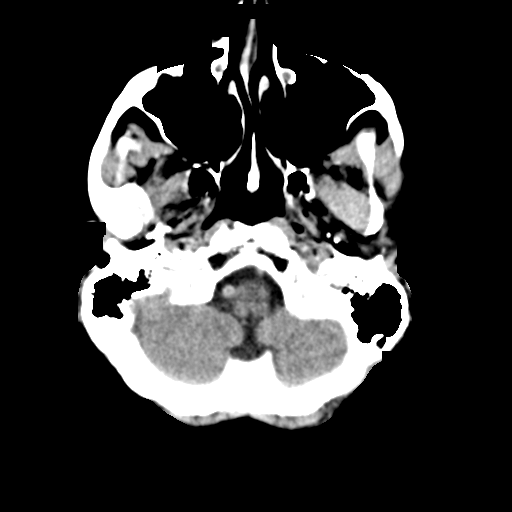
[im 9/32  brain]
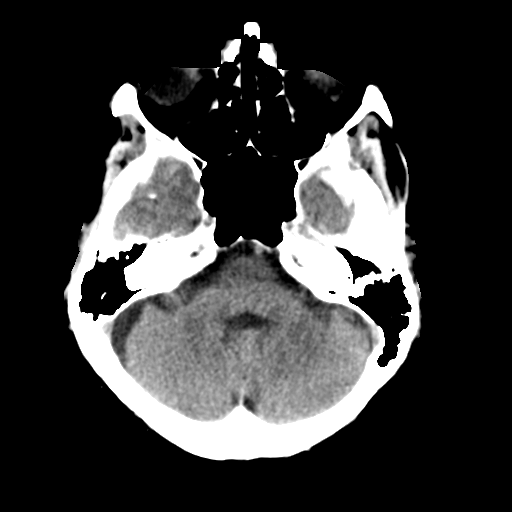
[im 12/32  brain]
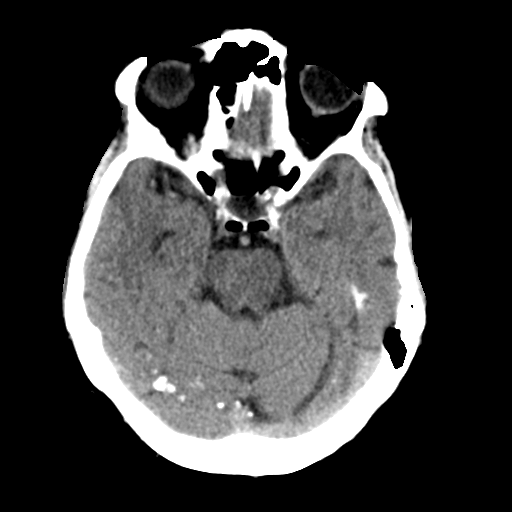
[im 17/32  brain]
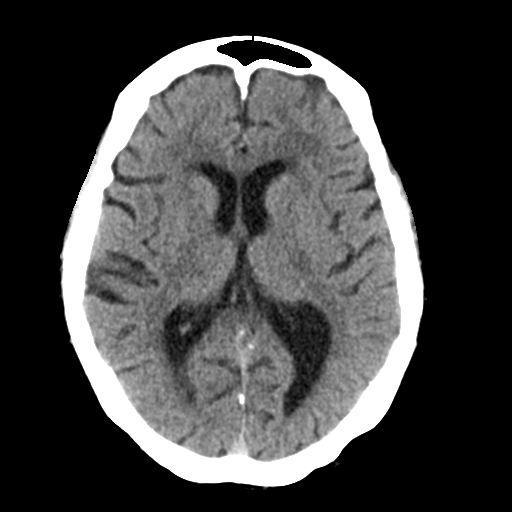
[im 17/32  bone]
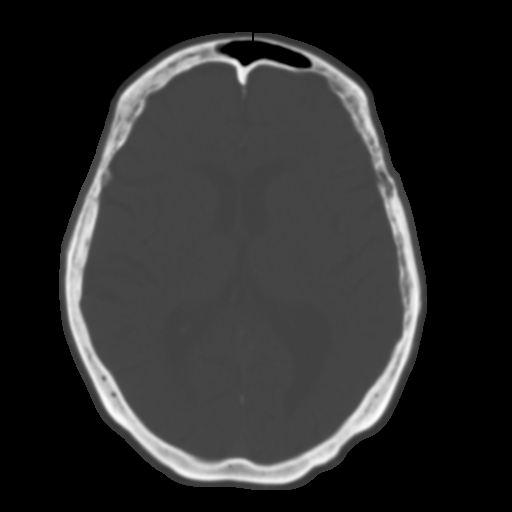
[im 20/32  brain]
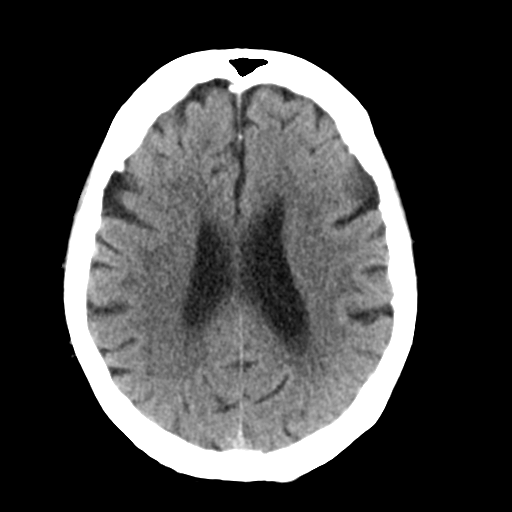
[im 23/32  brain]
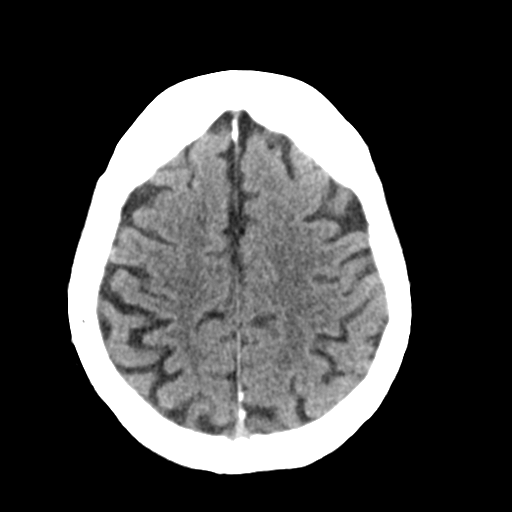
[im 26/32  brain]
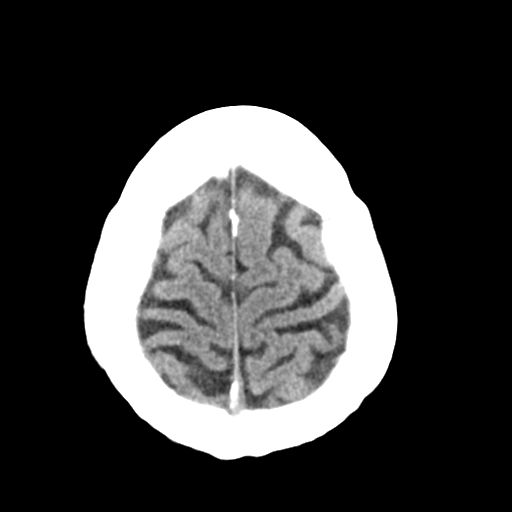
[im 29/32  brain]
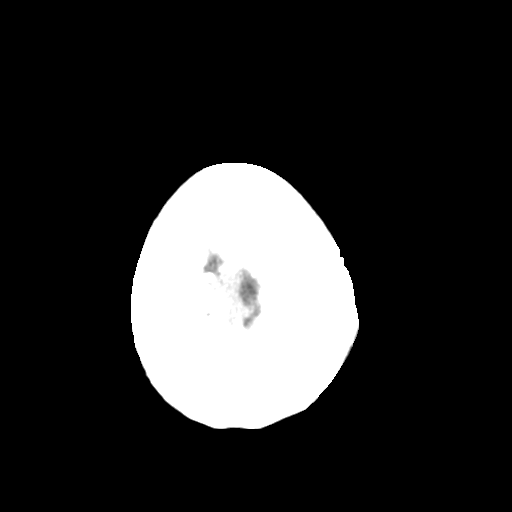
[im 29/32  bone]
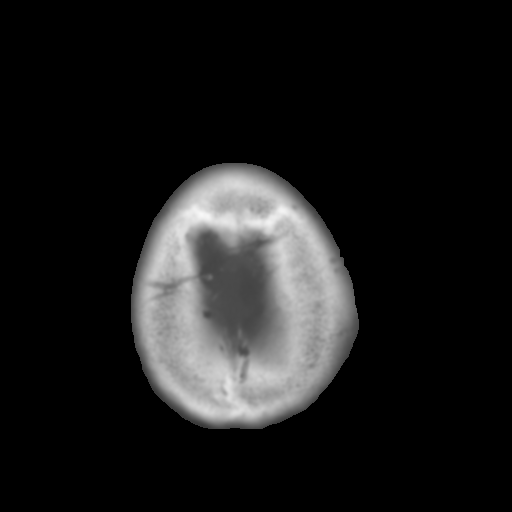

[Series 5: head 3.0 mpr cor · coronal · 0.35mm/px · 3 of 67 slices shown]
[im 25/67  brain]
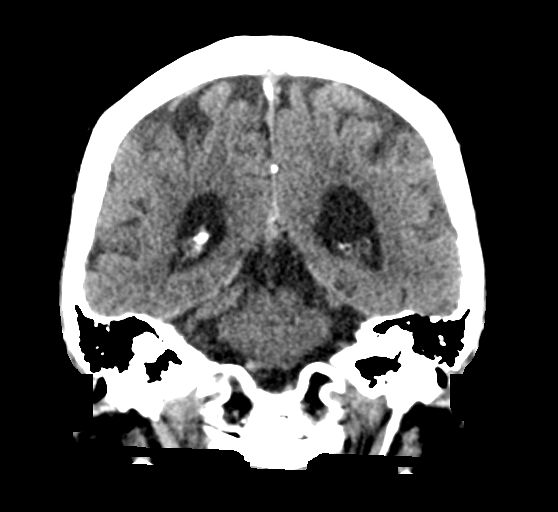
[im 31/67  brain]
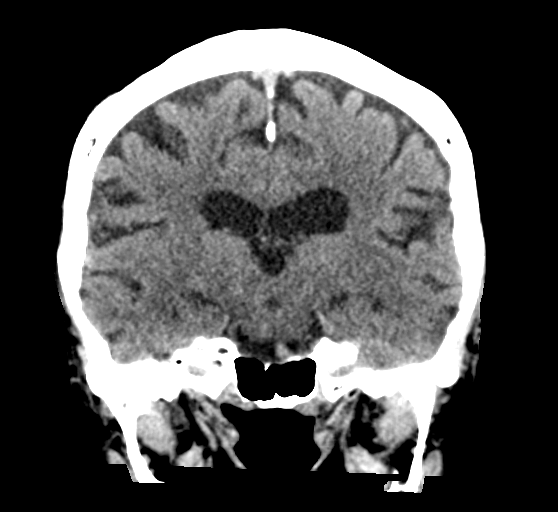
[im 37/67  brain]
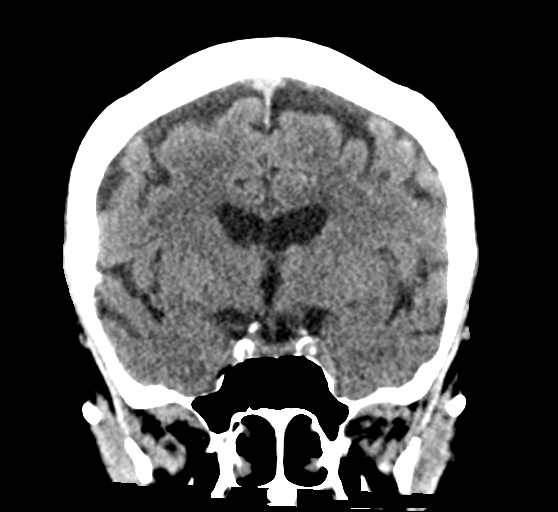

[Series 6: head 3.0 mpr sag · sagittal · 0.31mm/px · 3 of 65 slices shown]
[im 22/65  brain]
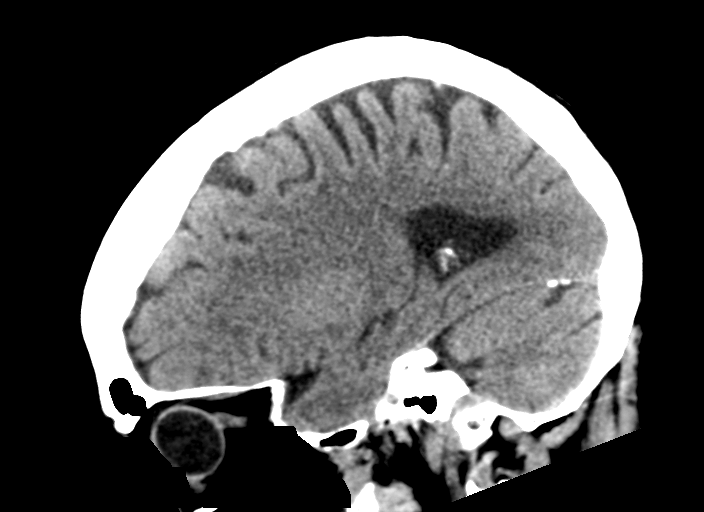
[im 33/65  brain]
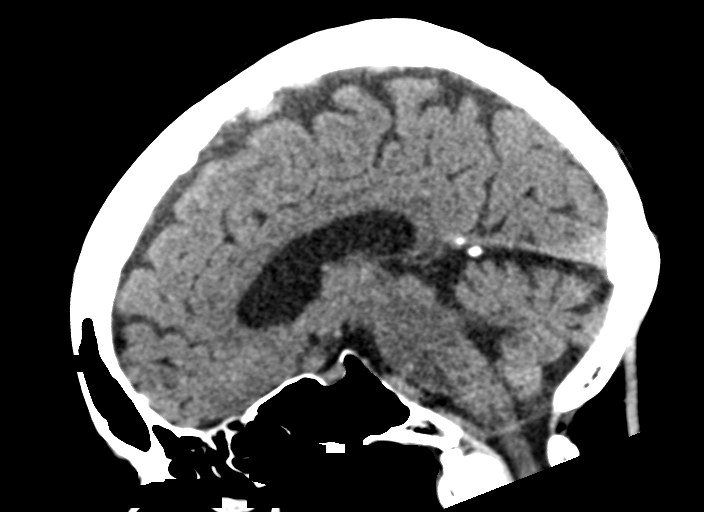
[im 43/65  brain]
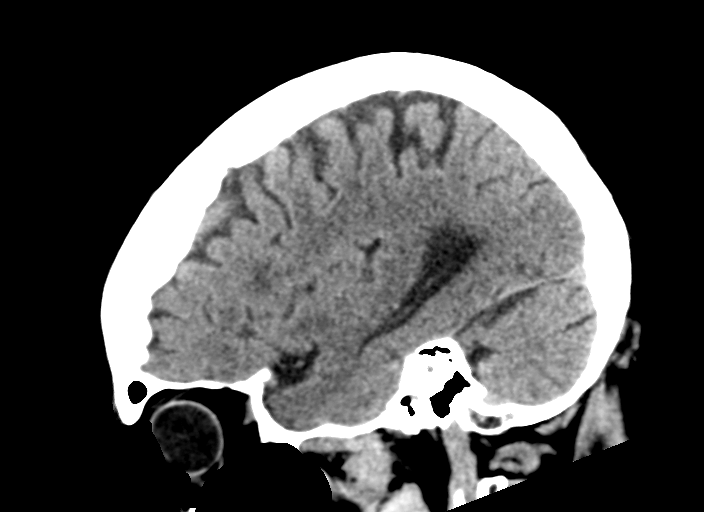

[15 of 47 positions shown; findings below may reference images not displayed]

FINDINGS: Brain: Normal anatomic configuration. Parenchymal volume loss is
commensurate with the patient's age. Mild periventricular white
matter changes are present likely reflecting the sequela of small
vessel ischemia. No abnormal intra or extra-axial mass lesion or
fluid collection. No abnormal mass effect or midline shift. No
evidence of acute intracranial hemorrhage or infarct. Ventricular
size is normal. Cerebellum unremarkable.

Vascular: No asymmetric hyperdense vasculature at the skull base.

Skull: Intact

Sinuses/Orbits: Paranasal sinuses are clear. Orbits are
unremarkable.

Other: Mastoid air cells and middle ear cavities are clear.
IMPRESSION: No acute intracranial abnormality.  Mild senescent change.

## 2022-04-17 DIAGNOSIS — M25512 Pain in left shoulder: Secondary | ICD-10-CM | POA: Diagnosis not present

## 2022-04-17 DIAGNOSIS — M6281 Muscle weakness (generalized): Secondary | ICD-10-CM | POA: Diagnosis not present

## 2022-04-22 ENCOUNTER — Encounter: Payer: Self-pay | Admitting: Adult Health

## 2022-04-22 DIAGNOSIS — M6281 Muscle weakness (generalized): Secondary | ICD-10-CM | POA: Diagnosis not present

## 2022-04-22 DIAGNOSIS — M25512 Pain in left shoulder: Secondary | ICD-10-CM | POA: Diagnosis not present

## 2022-04-25 ENCOUNTER — Encounter: Payer: Self-pay | Admitting: Adult Health

## 2022-04-25 ENCOUNTER — Other Ambulatory Visit: Payer: Self-pay | Admitting: Adult Health

## 2022-04-25 DIAGNOSIS — M6281 Muscle weakness (generalized): Secondary | ICD-10-CM | POA: Diagnosis not present

## 2022-04-25 DIAGNOSIS — M25512 Pain in left shoulder: Secondary | ICD-10-CM | POA: Diagnosis not present

## 2022-04-25 DIAGNOSIS — G250 Essential tremor: Secondary | ICD-10-CM

## 2022-04-25 NOTE — Telephone Encounter (Signed)
Called pt to see how she did with the decreased dosing of clonazepam.  She said she did the taper as suggested and that did help.  She says the tremor is better.  Is taking clonazepam 0.'5mg'$  tabs take 1/2 tablet po bid .

## 2022-04-25 NOTE — Telephone Encounter (Signed)
Patient sent a mychart message about klonopin and if Dr. Rexene Alberts is dismissing her. Dr. Rexene Alberts is NOT dismissing her as a patient. However Dr. Rexene Alberts is not going to continue to prescribe klonopin long term. Our goal to is wean her off this medicine. If she feels that she needs it for anxiety then this will need to be discussed with her PCP. Since she is doing well with decreased dose of 0.25 mg BID. I would recommend that we try to reduce to 0.25 mg daily.

## 2022-04-25 NOTE — Telephone Encounter (Signed)
Spoke to pt.  I relayed that that Dr. Rexene Alberts is not dismissing her, even if we do not prescribe the klonopin.  She has been on for years since Dr. Erling Cruz.  She is taking propranolol for bp and tremors, and lexapro for anxiety by pcp.  She will speak to pcp about klonopin when she see's them in early June.  She went down to 0.'25mg'$  po bid just this last month.  She did state her tremor was better. She is concerned that she cannot come off fast since she has been on this for years.  I know you want to have her try once daily.  I know she wants is bid daily.   She is due refill.  She has appt with Dr. Rexene Alberts 10-01-2022.

## 2022-04-25 NOTE — Telephone Encounter (Signed)
Spoke to [place], NP, per Dr. Rexene Alberts pt is to be tapered off klonopin. She feels the tapering is not satisfactory.  She is not pleased.  I relayed that per Jinny Blossom, NP she was told that she would be tapered off this.  She has been doing the 0.'25mg'$  po bid for the last 3wk-4wks and her next prescription will be 0.'25mg'$  po daily.  I told her that she is tapering from BID to once daily.  I hear that she is not pleased.  I would relay. She had been told about this by both providers.

## 2022-04-29 NOTE — Telephone Encounter (Signed)
Doing 3-4 weeks of a reduced dose is appropriate. If she is unable to tolerate the wean she can let us know. Otherwise if she feels that she needs it for anxiety then she should discuss with PCP. Dr. Rexene Alberts has requested that we try to wean her off this medication.

## 2022-04-30 DIAGNOSIS — M25512 Pain in left shoulder: Secondary | ICD-10-CM | POA: Diagnosis not present

## 2022-04-30 DIAGNOSIS — M6281 Muscle weakness (generalized): Secondary | ICD-10-CM | POA: Diagnosis not present

## 2022-05-02 DIAGNOSIS — M6281 Muscle weakness (generalized): Secondary | ICD-10-CM | POA: Diagnosis not present

## 2022-05-02 DIAGNOSIS — M25512 Pain in left shoulder: Secondary | ICD-10-CM | POA: Diagnosis not present

## 2022-05-07 ENCOUNTER — Other Ambulatory Visit: Payer: Self-pay | Admitting: Adult Health

## 2022-05-07 ENCOUNTER — Encounter: Payer: Self-pay | Admitting: Adult Health

## 2022-05-07 DIAGNOSIS — F419 Anxiety disorder, unspecified: Secondary | ICD-10-CM

## 2022-05-07 MED ORDER — BUSPIRONE HCL 5 MG PO TABS: 5.0000 mg | ORAL_TABLET | Freq: Three times a day (TID) | ORAL | 2 refills | Status: DC | PRN

## 2022-05-07 MED ORDER — ESCITALOPRAM OXALATE 10 MG PO TABS: ORAL_TABLET | ORAL | 3 refills | Status: DC

## 2022-05-08 DIAGNOSIS — M1711 Unilateral primary osteoarthritis, right knee: Secondary | ICD-10-CM | POA: Diagnosis not present

## 2022-05-08 DIAGNOSIS — M25561 Pain in right knee: Secondary | ICD-10-CM | POA: Diagnosis not present

## 2022-05-10 DIAGNOSIS — M6281 Muscle weakness (generalized): Secondary | ICD-10-CM | POA: Diagnosis not present

## 2022-05-10 DIAGNOSIS — M25512 Pain in left shoulder: Secondary | ICD-10-CM | POA: Diagnosis not present

## 2022-05-14 ENCOUNTER — Encounter: Payer: Self-pay | Admitting: Internal Medicine

## 2022-05-15 ENCOUNTER — Ambulatory Visit: Payer: Medicare PPO | Admitting: Adult Health

## 2022-05-15 ENCOUNTER — Encounter: Payer: Self-pay | Admitting: Adult Health

## 2022-05-15 VITALS — BP 130/72 | HR 52 | Temp 97.3°F | Wt 140.0 lb

## 2022-05-15 DIAGNOSIS — G250 Essential tremor: Secondary | ICD-10-CM

## 2022-05-15 DIAGNOSIS — F419 Anxiety disorder, unspecified: Secondary | ICD-10-CM

## 2022-05-15 DIAGNOSIS — R7309 Other abnormal glucose: Secondary | ICD-10-CM

## 2022-05-15 DIAGNOSIS — I471 Supraventricular tachycardia: Secondary | ICD-10-CM

## 2022-05-15 DIAGNOSIS — Z79899 Other long term (current) drug therapy: Secondary | ICD-10-CM | POA: Diagnosis not present

## 2022-05-15 DIAGNOSIS — N1832 Chronic kidney disease, stage 3b: Secondary | ICD-10-CM

## 2022-05-15 DIAGNOSIS — I700 Atherosclerosis of aorta: Secondary | ICD-10-CM | POA: Diagnosis not present

## 2022-05-15 DIAGNOSIS — I10 Essential (primary) hypertension: Secondary | ICD-10-CM

## 2022-05-15 DIAGNOSIS — E782 Mixed hyperlipidemia: Secondary | ICD-10-CM

## 2022-05-15 DIAGNOSIS — E559 Vitamin D deficiency, unspecified: Secondary | ICD-10-CM | POA: Diagnosis not present

## 2022-05-15 NOTE — Progress Notes (Signed)
3 MONTH FOLLOW UP  Assessment and Plan:   Atherosclerosis of aorta (HCC) - per CT 06/2021 Control blood pressure, cholesterol, glucose, increase exercise.   Essential hypertension Avoid over controlling BP, historically labile Continue propranolol QID Monitor blood pressure at home; call if consistently over 150/90 Continue DASH diet.   Reminder to go to the ER if any CP, SOB, nausea, dizziness, severe HA, changes vision/speech, left arm numbness and tingling and jaw pain.  Paroxysmal SVT ([place]) Continue BB; no recurrence; cardiology follows  History of TIA (transient ischemic attack) Continue eliquis; follow up with neurology Control blood pressure, cholesterol, glucose, increase exercise.   Essential tremor  Continue follow up with neuro, on inderal, off of topamax and actively tapering off of klonopin   HT CKD 3 ([place]) Family history of kidney failure in 2 siblings Bilateral renal cysts Increase fluids, avoid NSAIDS, monitor sugars, will monitor Nephrology is following - CMP/GFR  Vitamin D deficiency At goal; Continue supplementation  Hx/o DVT/PE Continue eliquis  No bleeding concerns, denies falls   Mixed hyperlipidemia Well controlled; continue statin Continue low cholesterol diet and exercise.  Check lipid panel q3-6 months  Other abnormal glucose Discussed disease and risks Discussed diet/exercise, weight management  Check A1C annually  Anxiety Lexapro 5 mg daily, buspar 5 mg TID  Discussed likely elevated of klonopin withdrawal sx, will be able to evaluate true anxiety baseline once off of benzo 2-3 weeks Stress management techniques discussed, increase water, good sleep hygiene discussed, increase exercise, and increase veggies.   No orders of the defined types were placed in this encounter.  Discussed med's effects and SE's. labs and tests as requested with regular follow-up as recommended. Over 30 minutes of exam, counseling, chart review, and complex,  high level critical decision making was performed this visit.   Future Appointments  Date Time Provider [place]  07/16/2022 11:30 AM [facility] VASC 3 [facility]  08/09/2022  9:40 AM Fay Records, MD [facility]  08/28/2022  3:00 PM Alycia Rossetti, NP [facility] None  10/01/2022 10:45 AM [name], MD [facility] None  01/14/2023  3:00 PM [name], NP [facility] None    HPI  88 y.o. female  presents for 4 month follow up for has History of endometrial cancer; Hx/o DVT/PE; Osteoporosis; Urethral prolapse; Essential hypertension; Hyperlipidemia, mixed; Abnormal glucose; Vitamin D deficiency; Essential tremor; Medication management; Chronic kidney disease, stage 3b ([place]); History of TIA (transient ischemic attack); B12 deficiency; History of skin cancer (basal cell); Complete right bundle branch block; Paroxysmal supraventricular tachycardia ([place]); Bilateral renal cysts; Aortic atherosclerosis ([place]) - CT 06/2021; Anxiety; and Thyroid nodule on their problem list.   Hx of DVTs, more recently she was found to have bilateral PEs on CTA 07/07/2021, hematology recommended indefinite NOAC unless bleeding contraindication, continues on eliquis 5 mg BID.   She has followed with neuro for tremors, Dr. Rexene Alberts. Has been on propranolol, topamax, klonapin but tapered off/down topamax due to sedation/ paresthesias, sx did resolve. She now takes propranolol 40 mg QID and klonopin (actively tapering, currently down to 0.5 mg once daily in AM with goal to get off).   Anxiety has been ongoing concern; she reported panic episodes after tapered down long term klonopin per neuro prescriber. She was initiated on lexapro 5 mg and given buspar 5 mg TID in the short term, sx improved and has tapered off of buspar, recently restarted due to panic attacks with another klonopin dose reduction. She notes some insomnia since on  lexapro 5 mg, takes at mid-day to manage, doesn't want higher dose.   BMI  is Body mass index is 24.03 kg/m., she has not been working on diet, admits poor appetite, has been drinking daily boost between meals, eating ice cream daily as a snack, concerned about weight trending down.  Weight down from 150 lb 09/2021.  Wt Readings from Last 3 Encounters:  05/15/22 140 lb (63.5 kg)  04/02/22 144 lb (65.3 kg)  01/10/22 146 lb (66.2 kg)   Her blood pressure has been controlled at home (more elevated since tapering off of klonopin), today [name] She does not workout. She denies chest pain, shortness of breath, dizziness.   Follows with Dr. Harrington Challenger after evaluation for episodes of syncope; holter in 2019 showed short burst of SVT, now on propranolol, 2D echo showed LVEF 55-60% with grade 1 DD. Normal myocardial perfusion in 2019. No further episodes.  She as aortic atherosclerosis per CT 06/2021.   She is on cholesterol medication (atorvastatin 10 mg daily) and denies myalgias. Her cholesterol is at goal. The cholesterol last visit was:   Lab Results  Component Value Date   CHOL 114 01/10/2022   HDL 52 01/10/2022   LDLCALC 46 01/10/2022   TRIG 81 01/10/2022   CHOLHDL 2.2 01/10/2022   She has been working on diet and exercise for glucose management, she is on bASA, and denies foot ulcerations, increased appetite, nausea, paresthesia of the feet, polydipsia, polyuria, visual disturbances, vomiting and weight loss. Last A1C in the office was:  Lab Results  Component Value Date   HGBA1C 5.4 04/10/2021   [name], family history of kidney failure in brother and sister 3-80 fluid ounces, daily, no alcohol, no NSAIDs, no hematuria or stone hx US showed simple renal cysts, markedly small L kidney [name] [name] who recommends monitoring only at this time; follows annually, has upcoming in March 2023.  Last GFR: Lab Results  Component Value Date   GFRNONAA 39 (L) 07/07/2021   GFRNONAA 38 (L) 06/16/2021   GFRNONAA 53 (L) 06/15/2021    Lab Results  Component Value Date   CREATININE 1.12 (H) 01/10/2022   CREATININE 1.21 (H) 08/14/2021   CREATININE 1.30 (H) 07/07/2021   Patient is on Vitamin D supplement   Lab Results  Component Value Date   VD25OH 78 08/14/2021     She is on supplement, taking 1000 mcg tab sublingual daily.  Lab Results  Component Value Date   [id number] 08/14/2021    Current Medications:  Current Outpatient Medications on File Prior to Visit  Medication Sig Dispense Refill   acetaminophen (TYLENOL) 500 MG tablet Take 500 mg by mouth every 6 (six) hours as needed for moderate pain.     apixaban (ELIQUIS) 5 MG TABS tablet Take  1 tablet 2 x/ day to Prevent Blood Clots 180 tablet 3   atorvastatin (LIPITOR) 10 MG tablet Take 1 tablet Daily for Cholesterol 90 tablet 3   busPIRone (BUSPAR) 5 MG tablet Take 1 tablet (5 mg total) by mouth 3 (three) times daily as needed (anxiety). 90 tablet 2   Calcium-Magnesium-Vitamin D 300-20-200 MG-MG-UNIT CHEW Chew 1 tablet daily by mouth.      Cholecalciferol (VITAMIN D3) 5000 units TABS Take 2 tablets by mouth daily.      clonazePAM (KLONOPIN) 0.5 MG tablet Take 0.5 tablets (0.25 mg total) by mouth daily. 30 tablet 0   escitalopram (LEXAPRO) 5 MG tablet Take  5 mg by mouth daily.     Magnesium 250 MG TABS Take 250 mg by mouth 2 (two) times a day.     propranolol (INDERAL) 40 MG tablet TAKE 1 TABLET BY MOUTH FOUR TIMES DAILY AT BEDTIME WITH MEALS FOR BLOOD PRESSURE OR TREMORS 360 tablet 3   vitamin B-12 (CYANOCOBALAMIN) 500 MCG tablet Take 500 mcg by mouth daily. Sublingual     No current facility-administered medications on file prior to visit.   Allergies:  Allergies  Allergen Reactions   Codeine Hives   Cortisone     Causes jitteriness.   Dexamethasone     anxiety   Hctz [Hydrochlorothiazide] Other (See Comments)    Low sodium   Meloxicam Nausea Only    Gastritis and nausea   Mysoline [Primidone]    Pneumovax 23 [Pneumococcal Vac Polyvalent]      Red, raised area.  Knot at site of injection   Prednisone     Anxiety   Topiramate     Sleepiness, numbness feet tingling in arm.   Toradol [Ketorolac Tromethamine] Swelling    Localized redness at injection site.     Ace Inhibitors Cough   Medical History:  She has History of endometrial cancer; Hx/o DVT/PE; Osteoporosis; Urethral prolapse; Essential hypertension; Hyperlipidemia, mixed; Abnormal glucose; Vitamin D deficiency; Essential tremor; Medication management; Chronic kidney disease, stage 3b ([place]); History of TIA (transient ischemic attack); B12 deficiency; History of skin cancer (basal cell); Complete right bundle branch block; Paroxysmal supraventricular tachycardia ([place]); Bilateral renal cysts; Aortic atherosclerosis ([place]) - CT 06/2021; Anxiety; and Thyroid nodule on their problem list.  Surgical History:  She has a past surgical history that includes Cesarean section (1961); Tonsillectomy and adenoidectomy; Appendectomy (1961); Total abdominal hysterectomy w/ bilateral salpingoophorectomy (1997); and cataract surg (Bilateral, 2021). Family History:  Herfamily history includes Blindness (age of onset: 60) in her sister; Cancer in her father and mother; Dementia in her mother; Diabetes in her sister; Heart disease in her brother, brother, brother, sister, and sister; Hypertension in her sister; Kidney failure in her brother and sister; Prostate cancer in her brother and brother; Skin cancer in her sister. Social History:  She reports that she has never smoked. She has never used smokeless tobacco. She reports that she does not currently use alcohol. She reports that she does not use drugs.   Review of Systems: Review of Systems  Constitutional:  Negative for malaise/fatigue and weight loss.  HENT:  Negative for hearing loss and tinnitus.   Eyes:  Negative for blurred vision and double vision.  Respiratory:  Negative for cough, sputum production, shortness of breath and wheezing.    Cardiovascular:  Negative for chest pain, palpitations, orthopnea, claudication, leg swelling and PND.  Gastrointestinal:  Negative for abdominal pain, blood in stool, constipation, diarrhea (mild loose stools, no blood or mucus), heartburn, melena, nausea and vomiting.  Genitourinary: Negative.   Musculoskeletal:  Negative for falls, joint pain and myalgias.  Skin:  Negative for rash.  Neurological:  Positive for tremors (Baseline, bilateral hands). Negative for dizziness, tingling, sensory change, weakness and headaches.  Endo/Heme/Allergies:  Negative for polydipsia.  Psychiatric/Behavioral:  Negative for depression, memory loss, substance abuse and suicidal ideas. The patient is nervous/anxious (worse with actively tapering off of klonopin). The patient does not have insomnia.   All other systems reviewed and are negative.  Physical Exam: Estimated body mass index is 24.03 kg/m as calculated from the following:   Height as of 04/02/22: '5\' 4"'$  (1.626 m).  Weight as of this encounter: 140 lb (63.5 kg). BP 130/72   Pulse (!) 52   Temp (!) 97.3 F (36.3 C)   Wt 140 lb (63.5 kg)   SpO2 98%   BMI 24.03 kg/m  General Appearance: Well nourished, in no apparent distress.  Eyes: PERRLA, EOMs, conjunctiva no swelling or erythema Sinuses: No Frontal/maxillary tenderness  ENT/Mouth: Ext aud canal with cerumen. Good dentition. No erythema, swelling, or exudate on post pharynx. Tonsils not swollen or erythematous. Hearing normal.  Neck: Supple, thyroid normal. No bruits  Respiratory: Respiratory effort normal, BS equal bilaterally without rales, rhonchi, wheezing or stridor.  Cardio: RRR without murmurs, rubs or gallops. Brisk peripheral pulses without edema.  Abdomen: Soft, nontender, no guarding, rebound, hernias, masses, or organomegaly.  Lymphatics: Non tender without lymphadenopathy.  Musculoskeletal: Full ROM all peripheral extremities,5/5 strength, and normal gait. Mild kyphosis.    Skin: Warm, dry without rashes, concerning lesions, ecchymosis. Neuro: Cranial nerves intact, reflexes equal bilaterally. Normal muscle tone, no cerebellar symptoms, subtle tremor of bilateral hands, head tremor Psych: Awake and oriented X 3, mildly anxious affect, Insight and Judgment appropriate.   Monica Ribas, NP-C 4:06 PM [facility]

## 2022-05-15 NOTE — Patient Instructions (Signed)
High-Protein and High-Calorie Diet Eating high-protein and high-calorie foods can help you to gain weight, heal after an injury, and recover after an illness or surgery. The specific amount of daily protein and calories you need depends on: Your body weight. The reason this diet is recommended for you. Generally, a high-protein, high-calorie diet involves: Eating 250-500 extra calories each day. Making sure that you get enough of your daily calories from protein. Ask your health care provider how many of your calories should come from protein. Talk with a health care provider or a dietitian about how much protein and how many calories you need each day. Follow the diet as directed by your health care provider. What are tips for following this plan?  Reading food labels Check the nutrition facts label for calories, grams of fat and protein. Items with more than 4 grams of protein are high-protein foods. Preparing meals Add whole milk, half-and-half, or heavy cream to cereal, pudding, soup, or hot cocoa. Add whole milk to instant breakfast drinks. Add peanut butter to oatmeal or smoothies. Add powdered milk to baked goods, smoothies, or milkshakes. Add powdered milk, cream, or butter to mashed potatoes. Add cheese to cooked vegetables. Make whole-milk yogurt parfaits. Top them with granola, fruit, or nuts. Add cottage cheese to fruit. Add avocado, cheese, or both to sandwiches or salads. Add avocado to smoothies. Add meat, poultry, or seafood to rice, pasta, casseroles, salads, and soups. Use mayonnaise when making egg salad, chicken salad, or tuna salad. Use peanut butter as a dip for fruits and vegetables or as a topping for pretzels, celery, or crackers. Add beans to casseroles, dips, and spreads. Add pureed beans to sauces and soups. Replace calorie-free drinks with calorie-containing drinks, such as milk and fruit juice. Replace water with milk or heavy cream when making foods such as  oatmeal, pudding, or cocoa. Add oil or butter to cooked vegetables and grains. Add cream cheese to sandwiches or as a topping on crackers and bread. Make cream-based pastas and soups. General information Ask your health care provider if you should take a nutritional supplement. Try to eat six small meals each day instead of three large meals. A general goal is to eat every 2 to 3 hours. Eat a balanced diet. In each meal, include one food that is high in protein and one food with fat in it. Keep nutritious snacks available, such as nuts, trail mixes, dried fruit, and yogurt. If you have kidney disease or diabetes, talk with your health care provider about how much protein is safe for you. Too much protein may put extra stress on your kidneys. Drink your calories. Choose high-calorie drinks and have them after your meals. Consider setting a timer to remind you to eat. You will want to eat even if you do not feel very hungry. What high-protein foods should I eat?  Vegetables Soybeans. Peas. Grains Quinoa. Bulgur wheat. Buckwheat. Meats and other proteins Beef, pork, and poultry. Fish and seafood. Eggs. Tofu. Textured vegetable protein (TVP). Peanut butter. Nuts and seeds. Dried beans. Protein powders. Hummus. Dairy Whole milk. Whole-milk yogurt. Powdered milk. Cheese. Cottage Cheese. Eggnog. Beverages High-protein supplement drinks. Soy milk. Other foods Protein bars. The items listed above may not be a complete list of foods and beverages you can eat and drink. Contact a dietitian for more information. What high-calorie foods should I eat? Fruits Dried fruit. Fruit leather. Canned fruit in syrup. Fruit juice. Avocado. Vegetables Vegetables cooked in oil or butter. Fried potatoes. Grains   Pasta. Quick breads. Muffins. Pancakes. Ready-to-eat cereal. Meats and other proteins Peanut butter. Nuts and seeds. Dairy Heavy cream. Whipped cream. Cream cheese. Sour cream. Ice cream. Custard.  Pudding. Whole milk dairy products. Beverages Meal-replacement beverages. Nutrition shakes. Fruit juice. Seasonings and condiments Salad dressing. Mayonnaise. Alfredo sauce. Fruit preserves or jelly. Honey. Syrup. Sweets and desserts Cake. Cookies. Pie. Pastries. Candy bars. Chocolate. Fats and oils Butter or margarine. Oil. Gravy. Other foods Meal-replacement bars. The items listed above may not be a complete list of foods and beverages you can eat and drink. Contact a dietitian for more information. Summary A high-protein, high-calorie diet can help you gain weight or heal faster after an injury, illness, or surgery. To increase your protein and calories, add ingredients such as whole milk, peanut butter, cheese, beans, meat, or seafood to meal items. To get enough extra calories each day, include high-calorie foods and beverages at each meal. Adding a high-calorie drink or shake can be an easy way to help you get enough calories each day. Talk with your healthcare provider or dietitian about the best options for you. This information is not intended to replace advice given to you by your health care provider. Make sure you discuss any questions you have with your health care provider. Document Revised: 10/29/2020 Document Reviewed: 10/29/2020 Elsevier Patient Education  2023 Elsevier Inc.  

## 2022-05-16 ENCOUNTER — Encounter: Payer: Self-pay | Admitting: Adult Health

## 2022-05-16 LAB — COMPLETE METABOLIC PANEL WITH GFR
AG Ratio: 2 (calc) (ref 1.0–2.5)
ALT: 14 U/L (ref 6–29)
AST: 17 U/L (ref 10–35)
Albumin: 4.5 g/dL (ref 3.6–5.1)
Alkaline phosphatase (APISO): 39 U/L (ref 37–153)
BUN/Creatinine Ratio: 19 (calc) (ref 6–22)
BUN: 20 mg/dL (ref 7–25)
CO2: 27 mmol/L (ref 20–32)
Calcium: 9.8 mg/dL (ref 8.6–10.4)
Chloride: 101 mmol/L (ref 98–110)
Creat: 1.08 mg/dL — ABNORMAL HIGH (ref 0.60–0.95)
Globulin: 2.2 g/dL (calc) (ref 1.9–3.7)
Glucose, Bld: 99 mg/dL (ref 65–99)
Potassium: 5.1 mmol/L (ref 3.5–5.3)
Sodium: 136 mmol/L (ref 135–146)
Total Bilirubin: 0.7 mg/dL (ref 0.2–1.2)
Total Protein: 6.7 g/dL (ref 6.1–8.1)
eGFR: 49 mL/min/{1.73_m2} — ABNORMAL LOW (ref >=60)

## 2022-05-16 LAB — CBC WITH DIFFERENTIAL/PLATELET
Absolute Monocytes: 720 cells/uL (ref 200–950)
Basophils Absolute: 113 cells/uL (ref 0–200)
Basophils Relative: 1.5 %
Eosinophils Absolute: 203 cells/uL (ref 15–500)
Eosinophils Relative: 2.7 %
HCT: 49.2 % — ABNORMAL HIGH (ref 35.0–45.0)
Hemoglobin: 16.2 g/dL — ABNORMAL HIGH (ref 11.7–15.5)
Lymphs Abs: 2873 cells/uL (ref 850–3900)
MCH: 28.8 pg (ref 27.0–33.0)
MCHC: 32.9 g/dL (ref 32.0–36.0)
MCV: 87.4 fL (ref 80.0–100.0)
MPV: 10.3 fL (ref 7.5–12.5)
Monocytes Relative: 9.6 %
Neutro Abs: 3593 cells/uL (ref 1500–7800)
Neutrophils Relative %: 47.9 %
Platelets: 502 10*3/uL — ABNORMAL HIGH (ref 140–400)
RBC: 5.63 10*6/uL — ABNORMAL HIGH (ref 3.80–5.10)
RDW: 13.9 % (ref 11.0–15.0)
Total Lymphocyte: 38.3 %
WBC: 7.5 10*3/uL (ref 3.8–10.8)

## 2022-05-16 LAB — LIPID PANEL
Cholesterol: 116 mg/dL (ref <200)
HDL: 53 mg/dL (ref >=50)
LDL Cholesterol (Calc): 43 mg/dL (calc)
Non-HDL Cholesterol (Calc): 63 mg/dL (calc) (ref <130)
Total CHOL/HDL Ratio: 2.2 (calc) (ref <5.0)
Triglycerides: 112 mg/dL (ref <150)

## 2022-05-16 LAB — MAGNESIUM: Magnesium: 2 mg/dL (ref 1.5–2.5)

## 2022-05-16 LAB — TSH: TSH: 2.26 mIU/L (ref 0.40–4.50)

## 2022-05-20 DIAGNOSIS — M6281 Muscle weakness (generalized): Secondary | ICD-10-CM | POA: Diagnosis not present

## 2022-05-20 DIAGNOSIS — M25512 Pain in left shoulder: Secondary | ICD-10-CM | POA: Diagnosis not present

## 2022-05-22 ENCOUNTER — Other Ambulatory Visit: Payer: Self-pay

## 2022-05-22 DIAGNOSIS — E782 Mixed hyperlipidemia: Secondary | ICD-10-CM

## 2022-05-22 MED ORDER — ATORVASTATIN CALCIUM 10 MG PO TABS: ORAL_TABLET | ORAL | 3 refills | Status: DC

## 2022-05-23 DIAGNOSIS — M25561 Pain in right knee: Secondary | ICD-10-CM | POA: Diagnosis not present

## 2022-05-24 DIAGNOSIS — M25512 Pain in left shoulder: Secondary | ICD-10-CM | POA: Diagnosis not present

## 2022-05-24 DIAGNOSIS — M6281 Muscle weakness (generalized): Secondary | ICD-10-CM | POA: Diagnosis not present

## 2022-05-27 DIAGNOSIS — M25512 Pain in left shoulder: Secondary | ICD-10-CM | POA: Diagnosis not present

## 2022-05-27 DIAGNOSIS — M6281 Muscle weakness (generalized): Secondary | ICD-10-CM | POA: Diagnosis not present

## 2022-05-29 DIAGNOSIS — S83241A Other tear of medial meniscus, current injury, right knee, initial encounter: Secondary | ICD-10-CM | POA: Diagnosis not present

## 2022-05-30 ENCOUNTER — Encounter: Payer: Self-pay | Admitting: Adult Health

## 2022-05-31 DIAGNOSIS — M6281 Muscle weakness (generalized): Secondary | ICD-10-CM | POA: Diagnosis not present

## 2022-05-31 DIAGNOSIS — M25512 Pain in left shoulder: Secondary | ICD-10-CM | POA: Diagnosis not present

## 2022-06-03 DIAGNOSIS — M6281 Muscle weakness (generalized): Secondary | ICD-10-CM | POA: Diagnosis not present

## 2022-06-03 DIAGNOSIS — M25512 Pain in left shoulder: Secondary | ICD-10-CM | POA: Diagnosis not present

## 2022-06-20 DIAGNOSIS — M1711 Unilateral primary osteoarthritis, right knee: Secondary | ICD-10-CM | POA: Diagnosis not present

## 2022-06-28 DIAGNOSIS — M1711 Unilateral primary osteoarthritis, right knee: Secondary | ICD-10-CM | POA: Diagnosis not present

## 2022-07-01 ENCOUNTER — Other Ambulatory Visit: Payer: Self-pay

## 2022-07-01 MED ORDER — APIXABAN 5 MG PO TABS: ORAL_TABLET | ORAL | 3 refills | Status: DC

## 2022-07-03 ENCOUNTER — Ambulatory Visit: Payer: Medicare PPO | Admitting: Nurse Practitioner

## 2022-07-03 VITALS — BP 140/78 | HR 62 | Temp 97.9°F | Ht 64.0 in | Wt 136.0 lb

## 2022-07-03 DIAGNOSIS — R42 Dizziness and giddiness: Secondary | ICD-10-CM | POA: Diagnosis not present

## 2022-07-03 DIAGNOSIS — H9313 Tinnitus, bilateral: Secondary | ICD-10-CM

## 2022-07-03 DIAGNOSIS — F13939 Sedative, hypnotic or anxiolytic use, unspecified with withdrawal, unspecified: Secondary | ICD-10-CM | POA: Diagnosis not present

## 2022-07-03 DIAGNOSIS — G250 Essential tremor: Secondary | ICD-10-CM | POA: Diagnosis not present

## 2022-07-03 DIAGNOSIS — J3489 Other specified disorders of nose and nasal sinuses: Secondary | ICD-10-CM

## 2022-07-03 DIAGNOSIS — H9202 Otalgia, left ear: Secondary | ICD-10-CM

## 2022-07-03 DIAGNOSIS — J302 Other seasonal allergic rhinitis: Secondary | ICD-10-CM | POA: Diagnosis not present

## 2022-07-03 NOTE — Progress Notes (Signed)
Assessment and Plan:  Monica Curry was seen today for an episodic visit .  Diagnoses and all order for this visit:  1. Dizziness Stay well hydrated. Move slow from positions. Monitor BP.  2. Left ear pain Zyrtec samples provided. Take as directed. Continue to monitor for improved symptoms. Discussed may need daily if effective.    3. Rhinorrhea   4. Seasonal allergic rhinitis, unspecified trigger Zyrtec samples provided.  5. Essential tremor Continue propranolol. Continue to monitor   6. Benzodiazepine withdrawal with complication ([place]) Discussed treatment with Clonidine 0.1 mg if symptoms do not resolve with antihistamine treatment. Continue to monitor   7.  Tinnitus Suggested having updated ear exam for any hearing loss contributor to tinnitus.  Notify office for further evaluation and treatment, questions or concerns if s/s fail to improve. The risks and benefits of my recommendations, as well as other treatment options were discussed with the patient today. Questions were answered.  Further disposition pending results of labs. Discussed med's effects and SE's.    Over 20 minutes of exam, counseling, chart review, and critical decision making was performed.   Future Appointments  Date Time Provider [place]  07/16/2022 11:30 AM [facility]-CV NL VASC 3 [facility]  08/09/2022  9:40 AM Fay Records, MD [facility]  08/28/2022  3:00 PM Alycia Rossetti, NP [facility] None  10/01/2022 10:45 AM [name], MD [facility] None  01/14/2023  3:00 PM Meleane Selinger, Kenney Houseman, NP [facility] None    ------------------------------------------------------------------------------------------------------------------   HPI BP 140/78   Pulse 62   Temp 97.9 F (36.6 C)   Ht '5\' 4"'$  (1.626 m)   Wt 136 lb (61.7 kg)   SpO2 96%   BMI 23.34 kg/m   88 y.o.female presents for evaluation of ongoing tinnitus and dizziness since stopping Clonopin in April. 2023.  States  her last dose was 1 month ago.  She does endorse a feeling of fullness in her left ear along with some right sided maxillary pressure.  She has been working out in the yard.  Also has clear rhinorrhea.  Denies fever, chills.  She continues to have tinnitus and feeling as though she is off balance.  Clonopin was replaced with Lexapro 5 mg and Buspar 5 mg TID.  She feels as though the medication is somewhat effective but makes the dizziness and fatigue worse.  She does not take a daily antihistamine.   She continues to have tremor which is treated with Propranolol 40 mg.  Past Medical History:  Diagnosis Date   C. difficile colitis    Cancer [facility])    Endometrial cancer   Essential and other specified forms of tremor    Osteoporosis 11/2019   T score -2.8 stable from prior DEXA   Personal history of venous thrombosis and embolism 1997   Pulmonary embolism ([place]) 06/14/2021   Pulmonary embolus (HCC)    Shingles    Thrush    TIA (transient ischemic attack)      Allergies  Allergen Reactions   Codeine Hives   Cortisone     Causes jitteriness.   Dexamethasone     anxiety   Hctz [Hydrochlorothiazide] Other (See Comments)    Low sodium   Meloxicam Nausea Only    Gastritis and nausea   Mysoline [Primidone]    Pneumovax 23 [Pneumococcal Vac Polyvalent]     Red, raised area.  Knot at site of injection   Prednisone     Anxiety   Topiramate  Sleepiness, numbness feet tingling in arm.   Toradol [Ketorolac Tromethamine] Swelling    Localized redness at injection site.     Ace Inhibitors Cough    Current Outpatient Medications on File Prior to Visit  Medication Sig   acetaminophen (TYLENOL) 500 MG tablet Take 500 mg by mouth every 6 (six) hours as needed for moderate pain.   apixaban (ELIQUIS) 5 MG TABS tablet Take  1 tablet 2 x/ day to Prevent Blood Clots   atorvastatin (LIPITOR) 10 MG tablet Take 1 tablet Daily for Cholesterol   busPIRone (BUSPAR) 5 MG tablet Take 1 tablet (5 mg  total) by mouth 3 (three) times daily as needed (anxiety).   Calcium-Magnesium-Vitamin D 300-20-200 MG-MG-UNIT CHEW Chew 1 tablet daily by mouth.    Cholecalciferol (VITAMIN D3) 5000 units TABS Take 2 tablets by mouth daily.    clonazePAM (KLONOPIN) 0.5 MG tablet Take 0.5 tablets (0.25 mg total) by mouth daily.   escitalopram (LEXAPRO) 5 MG tablet Take 5 mg by mouth daily.   Magnesium 250 MG TABS Take 250 mg by mouth 2 (two) times a day.   propranolol (INDERAL) 40 MG tablet TAKE 1 TABLET BY MOUTH FOUR TIMES DAILY AT BEDTIME WITH MEALS FOR BLOOD PRESSURE OR TREMORS   vitamin B-12 (CYANOCOBALAMIN) 500 MCG tablet Take 500 mcg by mouth daily. Sublingual   No current facility-administered medications on file prior to visit.    ROS: all negative except what is noted in the HPI.     Physical Exam:  BP 140/78   Pulse 62   Temp 97.9 F (36.6 C)   Ht '5\' 4"'$  (1.626 m)   Wt 136 lb (61.7 kg)   SpO2 96%   BMI 23.34 kg/m   General Appearance: NAD.  Awake, conversant and cooperative. Eyes: PERRLA, EOMs intact.  Sclera white.  Conjunctiva without erythema. Sinuses: No frontal/maxillary tenderness.  No nasal discharge. Nares patent.  ENT/Mouth: Ext aud canals clear.  Bilateral TMs w/DOL and without erythema or bulging. Hearing intact.  Posterior pharynx without swelling or exudate.  Tonsils without swelling or erythema.  Neck: Supple.  No masses, nodules or thyromegaly. Respiratory: Effort is regular with non-labored breathing. Breath sounds are equal bilaterally without rales, rhonchi, wheezing or stridor.  Cardio: RRR with no MRGs. Brisk peripheral pulses without edema.  Abdomen: Active BS in all four quadrants.  Soft and non-tender without guarding, rebound tenderness, hernias or masses. Lymphatics: Non tender without lymphadenopathy.  Musculoskeletal: Full ROM, 5/5 strength, normal ambulation.  No clubbing or cyanosis. Skin: Appropriate color for ethnicity. Warm without rashes, lesions,  ecchymosis, ulcers.  Neuro: Generalized tremor. CN II-XII grossly normal. Normal muscle tone without cerebellar symptoms and intact sensation.   Psych: AO X 3,  appropriate mood and affect, insight and judgment.     Darrol Jump, NP 1:27 PM [facility]

## 2022-07-05 ENCOUNTER — Encounter: Payer: Self-pay | Admitting: Nurse Practitioner

## 2022-07-05 DIAGNOSIS — F13939 Sedative, hypnotic or anxiolytic use, unspecified with withdrawal, unspecified: Secondary | ICD-10-CM

## 2022-07-07 ENCOUNTER — Encounter: Payer: Self-pay | Admitting: Nurse Practitioner

## 2022-07-08 DIAGNOSIS — M1711 Unilateral primary osteoarthritis, right knee: Secondary | ICD-10-CM | POA: Diagnosis not present

## 2022-07-10 MED ORDER — CLONIDINE HCL 0.1 MG PO TABS: 0.1000 mg | ORAL_TABLET | Freq: Two times a day (BID) | ORAL | 0 refills | Status: DC | PRN

## 2022-07-15 ENCOUNTER — Encounter: Payer: Self-pay | Admitting: Nurse Practitioner

## 2022-07-16 ENCOUNTER — Ambulatory Visit (HOSPITAL_COMMUNITY)
Admission: RE | Admit: 2022-07-16 | Discharge: 2022-07-16 | Disposition: A | Discharge status: Home / Self Care | Payer: Medicare PPO | Source: Ambulatory Visit | Attending: Cardiology | Admitting: Cardiology

## 2022-07-16 DIAGNOSIS — I6523 Occlusion and stenosis of bilateral carotid arteries: Secondary | ICD-10-CM | POA: Insufficient documentation

## 2022-07-17 ENCOUNTER — Encounter: Payer: Self-pay | Admitting: Physician Assistant

## 2022-08-08 NOTE — Progress Notes (Unsigned)
+   Cardiology Office Note   Date:  06/12/6432   ID:  Monica Curry, DOB 2/95/1882, MRN [id number]  PCP:  Unk Pinto, MD  Cardiologist:   Dorris Carnes, MD   F/U of HTN     History of Present Illness: Monica Curry is a 88 y.o. female with a history of a TIA, HTN,  SVT (found on event monitor; placed on diltiazem) CKD stage III, hx of pulmonary embolism, DVT, essential tremors and presyncope  In May 2022 admitted for another PE     I saw the pt in Nov 2022  Overall she has been doing OK  No CP   No SOB   When she lays down she says she feels her heart "working hard"   Denies pain.  No PND    Able to sleep     Current Meds  Medication Sig   acetaminophen (TYLENOL) 500 MG tablet Take 500 mg by mouth every 6 (six) hours as needed for moderate pain.   apixaban (ELIQUIS) 5 MG TABS tablet Take  1 tablet 2 x/ day to Prevent Blood Clots   atorvastatin (LIPITOR) 10 MG tablet Take 1 tablet Daily for Cholesterol   busPIRone (BUSPAR) 5 MG tablet Take 1 tablet (5 mg total) by mouth 3 (three) times daily as needed (anxiety).   Calcium-Magnesium-Vitamin D 300-20-200 MG-MG-UNIT CHEW Chew 1 tablet daily by mouth.    Cholecalciferol (VITAMIN D3) 5000 units TABS Take 2 tablets by mouth daily.    escitalopram (LEXAPRO) 5 MG tablet Take 5 mg by mouth daily.   Magnesium 250 MG TABS Take 250 mg by mouth 2 (two) times a day.   propranolol (INDERAL) 40 MG tablet TAKE 1 TABLET BY MOUTH FOUR TIMES DAILY AT BEDTIME WITH MEALS FOR BLOOD PRESSURE OR TREMORS   vitamin B-12 (CYANOCOBALAMIN) 500 MCG tablet Take 500 mcg by mouth daily. Sublingual     Allergies:   Codeine, Cortisone, Dexamethasone, Hctz [hydrochlorothiazide], Meloxicam, Mysoline [primidone], Pneumovax 23 [pneumococcal vac polyvalent], Prednisone, Topiramate, Toradol [ketorolac tromethamine], and Ace inhibitors   Past Medical History:  Diagnosis Date   C. difficile colitis    Cancer ([place])    Endometrial cancer   Essential and other  specified forms of tremor    Osteoporosis 11/2019   T score -2.8 stable from prior DEXA   Personal history of venous thrombosis and embolism 1997   Pulmonary embolism ([place]) 06/14/2021   Pulmonary embolus ([place])    Shingles    Thrush    TIA (transient ischemic attack)    carotid US 07/2022: no ICA stenosis bilaterally    Past Surgical History:  Procedure Laterality Date   APPENDECTOMY  1961   cataract surg Bilateral 2021   Dr. Gershon Crane, R eye 8/18, L 8/25   [place]   TONSILLECTOMY AND ADENOIDECTOMY     TOTAL ABDOMINAL HYSTERECTOMY W/ BILATERAL SALPINGOOPHORECTOMY  1997     Social History:  The patient  reports that she has never smoked. She has never used smokeless tobacco. She reports that she does not currently use alcohol. She reports that she does not use drugs.   Family History:  The patient's family history includes Blindness (age of onset: 61) in her sister; Cancer in her father and mother; Dementia in her mother; Diabetes in her sister; Heart disease in her brother, brother, brother, sister, and sister; Hypertension in her sister; Kidney failure in her brother and sister; Prostate cancer in her brother and brother; Skin cancer in her  sister.    ROS:  Please see the history of present illness. All other systems are reviewed and  Negative to the above problem except as noted.    PHYSICAL EXAM: VS:  BP 122/72   Pulse (!) 56   Ht '5\' 4"'$  (1.626 m)   Wt 135 lb 6.4 oz (61.4 kg)   SpO2 98%   BMI 23.24 kg/m   GEN: Well nourished, well developed in no acute distress  HEENT: normal  Neck: no JVD, no bruits Cardiac: RRR; no murmur.  No LE edema  Respiratory:  clear to auscultation bilaterally GI: soft, nontender, nondistended, + BS  No hepatomegaly  MS: no deformity Moving all extremities   Skin: warm and dry Neuro:  Strength and sensation are intact Psych: euthymic mood, full affect   EKG:  EKG is ordered today.  SB  57 bpm   RBBB  Lipid Panel     Component Value Date/Time   CHOL 116 05/15/2022 1630   TRIG 112 05/15/2022 1630   HDL 53 05/15/2022 1630   CHOLHDL 2.2 05/15/2022 1630   VLDL 24 10/24/2017 0629   LDLCALC 43 05/15/2022 1630      Wt Readings from Last 3 Encounters:  08/09/22 135 lb 6.4 oz (61.4 kg)  07/03/22 136 lb (61.7 kg)  05/15/22 140 lb (63.5 kg)    Additional studies/ records that were reviewed today include:    Carotid USN  07/2022   Right Carotid: There is no evidence of stenosis in the right ICA. The                 extracranial vessels were near-normal with only minimal  wall                 thickening or plaque.   Left Carotid: There is no evidence of stenosis in the left ICA. The  extracranial                vessels were near-normal with only minimal wall thickening  or                plaque.   Vertebrals:  Bilateral vertebral arteries demonstrate antegrade flow.  Subclavians: Normal flow hemodynamics were seen in bilateral subclavian               arteries.    Stress test 04/2018 Nuclear stress EF: 76%. There was no ST segment deviation noted during stress. No T wave inversion was noted during stress. The study is normal. This is a low risk study. The left ventricular ejection fraction is hyperdynamic (>65%).   Low risk stress nuclear study with normal perfusion and normal left ventricular regional and global systolic function.   Monitor 01/2018 NSR with sinus bradycardia 2. Atrial tachycardia at 160 is present 3. Rare PVC's 4. Noise artifact   Echo 10/2017 Study Conclusions   - Left ventricle: The cavity size was normal. Systolic function was   normal. The estimated ejection fraction was in the range of 55%   to 60%. Wall motion was normal; there were no regional wall   motion abnormalities. Doppler parameters are consistent with   abnormal left ventricular relaxation (grade 1 diastolic   dysfunction). - Mitral valve: There was mild regurgitation. - Left atrium: The atrium was  moderately dilated.   Impressions:   - No cardiac source of emboli was indentified.    ASSESSMENT AND PLAN:  1   HTN  BP is controlled  2  HL  Keep on lipitor  Recent labs LDL in June was 43, HDL 53  3   Hx SVT  Denies palpitations    4  Dizziness  Balance is OK     5   Hx TIA Keep on ASA and lipitor    6  Hx of PE  Continue Eliquis    7   CV dz  Keep on statins   F/U in 9 months   Current medicines are reviewed at length with the patient today.  The patient does not have concerns regarding medicines.  Signed, Dorris Carnes, MD  08/11/2022 9:43 PM    [facility] [address], [place], [place]  [address] Phone: [phone]; Fax: [phone]

## 2022-08-09 ENCOUNTER — Encounter: Payer: Self-pay | Admitting: Internal Medicine

## 2022-08-09 ENCOUNTER — Ambulatory Visit: Payer: Medicare PPO | Attending: Internal Medicine | Admitting: Internal Medicine

## 2022-08-09 VITALS — BP 122/72 | HR 56 | Ht 64.0 in | Wt 135.4 lb

## 2022-08-09 DIAGNOSIS — I471 Supraventricular tachycardia: Secondary | ICD-10-CM

## 2022-08-09 NOTE — Patient Instructions (Signed)
Medication Instructions:   *If you need a refill on your cardiac medications before your next appointment, please call your pharmacy*   Lab Work:  If you have labs (blood work) drawn today and your tests are completely normal, you will receive your results only by: MyChart Message (if you have MyChart) OR A paper copy in the mail If you have any lab test that is abnormal or we need to change your treatment, we will call you to review the results.   Testing/Procedures:    Follow-Up: At [facility], you and your health needs are our priority.  As part of our continuing mission to provide you with exceptional heart care, we have created designated Provider Care Teams.  These Care Teams include your primary Cardiologist (physician) and Advanced Practice Providers (APPs -  Physician Assistants and Nurse Practitioners) who all work together to provide you with the care you need, when you need it.  We recommend signing up for the patient portal called "MyChart".  Sign up information is provided on this After Visit Summary.  MyChart is used to connect with patients for Virtual Visits (Telemedicine).  Patients are able to view lab/test results, encounter notes, upcoming appointments, etc.  Non-urgent messages can be sent to your provider as well.   To learn more about what you can do with MyChart, go to [email or web address].    Your next appointment:   9 month(s)  The format for your next appointment:   In Person  Provider:   Paula Ross, MD     Other Instructions   Important Information About Sugar       

## 2022-08-13 ENCOUNTER — Other Ambulatory Visit: Payer: Self-pay | Admitting: Nurse Practitioner

## 2022-08-14 ENCOUNTER — Encounter: Payer: Medicare PPO | Admitting: Adult Health

## 2022-08-14 DIAGNOSIS — M1711 Unilateral primary osteoarthritis, right knee: Secondary | ICD-10-CM | POA: Diagnosis not present

## 2022-08-20 ENCOUNTER — Other Ambulatory Visit: Payer: Self-pay

## 2022-08-20 MED ORDER — BUSPIRONE HCL 5 MG PO TABS
5.0000 mg | ORAL_TABLET | Freq: Three times a day (TID) | ORAL | 2 refills | Status: DC | PRN
Start: 2022-08-20 — End: 2022-08-28

## 2022-08-22 DIAGNOSIS — H26493 Other secondary cataract, bilateral: Secondary | ICD-10-CM | POA: Diagnosis not present

## 2022-08-22 DIAGNOSIS — Z961 Presence of intraocular lens: Secondary | ICD-10-CM | POA: Diagnosis not present

## 2022-08-27 NOTE — Progress Notes (Unsigned)
 CPE  Assessment and Plan:  Encounter for Annual Physical Exam with abnormal findings Due annually  Health Maintenance reviewed Healthy lifestyle reviewed and goals set  Atherosclerosis of aorta ([place]) - per CT 06/2021 Control blood pressure, cholesterol, glucose, increase exercise.   Essential hypertension At goal; continue medication - olmesartan 10 mg for now, but reduce to 5 mg if having any hypotension once anxiety improves Monitor blood pressure at home; call if consistently over 150/0 Continue DASH diet.   Reminder to go to the ER if any CP, SOB, nausea, dizziness, severe HA, changes vision/speech, left arm numbness and tingling and jaw pain.  Complete RBBB Followed by cardiology; monitor   Paroxysmal SVT ([place]) Continue BB; no recurrence; cardiology follows  History of TIA (transient ischemic attack) Continue eliquis; follow up with neurology Control blood pressure, cholesterol, glucose, increase exercise.   Essential tremor  Continue follow up with neuro, on inderal, off of topamax and tapering off of klonopin;   Osteoporosis, unspecified osteoporosis type, unspecified pathological fracture presence Curently on drug break with hx of bisphosphonate tx; last DEXA showed stable T score; continue follow up with GYN, due this year Continue vitamin D supplementation, recommend weight bearing exercises  History of endometrial cancer S/p hysterectomy; continue follow up with GYN  Urethral prolapse S/p hysterectomy; continue follow up with GYN   HT CKD 3 ([place]) Family history of kidney failure in 2 siblings Bilateral renal cysts Increase fluids, avoid NSAIDS, monitor sugars, will monitor Nephrology is following  Vitamin D deficiency At goal; Continue supplementation  Hx/o DVT/PE Continue eliquis  No bleeding concerns, denies falls   Mixed hyperlipidemia Well controlled; continue statin Continue low cholesterol diet and exercise.  Check lipid panel q3-6  months  Other abnormal glucose Discussed disease and risks Discussed diet/exercise, weight management  Check A1C annually  B12 deficiency On supplement (sublingual); check B12 level  Personal history of skin cancer (basal cell) Followed by derm  Anxiety/ Insomnia Difficulty falling asleep- restless legs interrupts sleep Continue lexapro 5 mg daily Increase Buspar to 7.5 mg three times a day Discussed risk of SS, information given  Follow up in 2 months  Dizziness Started with some blurred vision which is being treated with laser to the right eye on 09/04/22 and the left eye 09/18/22.  If dizziness does not improve after surgery please notify the office  Discussed med's effects and SE's. Screening labs and tests as requested with regular follow-up as recommended. Over 40 minutes of exam, counseling, chart review, and complex, high level critical decision making was performed this visit.   Future Appointments  Date Time Provider Col[place]  10/01/2022 10:45 AM [name], MD [facility] None  01/14/2023  3:00 PM Darrol Jump, NP [facility] None  09/01/2023  2:00 PM Alycia Rossetti, NP [facility] None    Plan:   During the course of the visit the patient was educated and counseled about appropriate screening and preventive services including:   Pneumococcal vaccine  Prevnar 13 Influenza vaccine Td vaccine Screening electrocardiogram Bone densitometry screening Colorectal cancer screening Diabetes screening Glaucoma screening Nutrition counseling  Advanced directives: requested    HPI  88 y.o. female  presents for CPE and follow up for has History of endometrial cancer; Hx/o DVT/PE; Osteoporosis; Urethral prolapse; Essential hypertension; Hyperlipidemia, mixed; Abnormal glucose; Vitamin D deficiency; Essential tremor; Medication management; Chronic kidney disease, stage 3b ([place]); History of TIA (transient ischemic attack); B12 deficiency; History of skin cancer  (basal cell); Complete right bundle branch block; Paroxysmal  supraventricular tachycardia ([place]); Bilateral renal cysts; Aortic atherosclerosis ([place]) - CT 06/2021; Anxiety; and Thyroid nodule on their problem list.   She is widowed, 1 living child. She has 2 grandchildren.   She is having dizziness and feeling lightheaded almost constantly x several months.  She does have to get laser surgery to fix cloudiness after catarract surgery.   Hx of DVTs, recently found to have bilateral PEs on CTA 07/07/2021, hematology recommended indefinite NOAC unless bleeding contraindication.   The patient was admitted in 10/2017 for a L Brain TIA w/ transient expressive aphasia, workup unremarkable. She is followed by Neurology. She presented to ED on 07/07/2021 c/o tingling of left upper and lower extremity and hypertensive emergency. They found no appreciable deficits on exam, underwent unremarkable CT, MRI, stable/unchanged since 2018 with moderate nonspecific cerebral white matter signal changes.   She has followed with neuro for tremors, Dr. Rexene Alberts. She is currently on Propranolol. Paresthesias did resolve. She had normal EEG (08/10/2021) and sleep study planned once off of klonopin. She has issues with restless legs which does make sleep difficult. Does not want to take medication for restless legs.   Anxiety has been concern, she dis currently on Lexapro 5 mg and Buspar 5 mg TID as needed.   She was also followed by Dr. Soundra Pilon (GYN) and Dr. Delilah Shan, goes annually who manages PAPs, [facility] ([facility]), DEXA. Hx of endometrial cancer s/p hysterectomy, urethral prolapse. She is currently on drug break from bisphosphonate for osteoporosis.   BMI is Body mass index is 24.25 kg/m., she has been working on diet, exercise has been limited since right knee injury in January. Dr. Norm Salt has evaluated and treated.  Doing more yard work. Washer and dryer are in the basement Wt Readings from Last 3 Encounters:  08/28/22 138 lb  (62.6 kg)  08/09/22 135 lb 6.4 oz (61.4 kg)  07/03/22 136 lb (61.7 kg)   Her blood pressure has been controlled at home, currently on Propranolol 40 mg daily,today [name] is BP: 120/82  BP Readings from Last 3 Encounters:  08/28/22 120/82  08/09/22 122/72  07/03/22 140/78  She does not workout. She denies chest pain, shortness of breath, dizziness.    Follows with Dr. Harrington Challenger after evaluation for episodes of syncope; holter in 2019 showed short burst of SVT, now on propranolol, 2D echo showed LVEF 55-60% with grade 1 DD. Normal myocardial perfusion in 2019. No further episodes.  She as aortic atherosclerosis per CT 06/2021.   She is on cholesterol medication (atorvastatin 10 mg daily) and denies myalgias. Her cholesterol is at goal. The cholesterol last visit was:   Lab Results  Component Value Date   CHOL 116 05/15/2022   HDL 53 05/15/2022   LDLCALC 43 05/15/2022   TRIG 112 05/15/2022   CHOLHDL 2.2 05/15/2022   She has been working on diet and exercise for glucose management, she is on bASA, and denies foot ulcerations, increased appetite, nausea, paresthesia of the feet, polydipsia, polyuria, visual disturbances, vomiting and weight loss. Last A1C in the office was:  Lab Results  Component Value Date   HGBA1C 5.4 04/10/2021   [name], family history of kidney failure in brother and sister 75-80 fluid ounces, daily, no alcohol, no NSAIDs, no hematuria or stone hx US showed simple renal cysts, markedly small L kidney [facility] [place] kidney [name] who recommends monitoring only at this time; follows annually Last GFR: Lab Results  Component Value Date   EGFR 49 (  L) 05/15/2022    Patient is on Vitamin D supplement   Lab Results  Component Value Date   VD25OH 78 08/14/2021     She is on supplement, taking 1000 mcg tab sublingual daily.  Lab Results  Component Value Date   [id number] 124 08/14/2021    Current Medications:  Current Outpatient Medications on File  Prior to Visit  Medication Sig Dispense Refill   acetaminophen (TYLENOL) 500 MG tablet Take 500 mg by mouth every 6 (six) hours as needed for moderate pain.     apixaban (ELIQUIS) 5 MG TABS tablet Take  1 tablet 2 x/ day to Prevent Blood Clots 180 tablet 3   atorvastatin (LIPITOR) 10 MG tablet Take 1 tablet Daily for Cholesterol 90 tablet 3   busPIRone (BUSPAR) 5 MG tablet Take 1 tablet (5 mg total) by mouth 3 (three) times daily as needed (anxiety). 90 tablet 2   Calcium-Magnesium-Vitamin D 300-20-200 MG-MG-UNIT CHEW Chew 1 tablet daily by mouth.      Cholecalciferol (VITAMIN D3) 5000 units TABS Take 2 tablets by mouth daily.      escitalopram (LEXAPRO) 5 MG tablet TAKE 1 TABLET BY MOUTH DAILY FOR CHRONIC ANXIETY 90 tablet 0   Magnesium 250 MG TABS Take 250 mg by mouth 2 (two) times a day.     propranolol (INDERAL) 40 MG tablet TAKE 1 TABLET BY MOUTH FOUR TIMES DAILY AT BEDTIME WITH MEALS FOR BLOOD PRESSURE OR TREMORS 360 tablet 3   vitamin B-12 (CYANOCOBALAMIN) 500 MCG tablet Take 500 mcg by mouth daily. Sublingual     No current facility-administered medications on file prior to visit.   Allergies:  Allergies  Allergen Reactions   Codeine Hives   Cortisone     Causes jitteriness.   Dexamethasone     anxiety   Hctz [Hydrochlorothiazide] Other (See Comments)    Low sodium   Meloxicam Nausea Only    Gastritis and nausea   Mysoline [Primidone]    Pneumovax 23 [Pneumococcal Vac Polyvalent]     Red, raised area.  Knot at site of injection   Prednisone     Anxiety   Topiramate     Sleepiness, numbness feet tingling in arm.   Toradol [Ketorolac Tromethamine] Swelling    Localized redness at injection site.     Ace Inhibitors Cough   Medical History:  She has History of endometrial cancer; Hx/o DVT/PE; Osteoporosis; Urethral prolapse; Essential hypertension; Hyperlipidemia, mixed; Abnormal glucose; Vitamin D deficiency; Essential tremor; Medication management; Chronic kidney disease,  stage 3b ([place]); History of TIA (transient ischemic attack); B12 deficiency; History of skin cancer (basal cell); Complete right bundle branch block; Paroxysmal supraventricular tachycardia ([place]); Bilateral renal cysts; Aortic atherosclerosis ([place]) - CT 06/2021; Anxiety; and Thyroid nodule on their problem list. Health Maintenance:   Immunization History  Administered Date(s) Administered   H1N1 01/03/2009   Influenza Whole 09/23/2013   Influenza, High Dose Seasonal PF 09/06/2014, 08/23/2015, 08/21/2016, 08/28/2017, 09/01/2018, 09/14/2019, 09/19/2020, 10/02/2021   PFIZER(Purple Top)SARS-COV-2 Vaccination 12/29/2019, 01/16/2020, 09/06/2020, 05/04/2021   PPD Test 03/29/2014   Pneumococcal Polysaccharide-23 03/29/2014   Pneumococcal-Unspecified 12/09/2002   Td 03/23/2013   Zoster, Live 12/09/2006    Preventative care: Last colonoscopy: 2011 DONE MGM 12/06/2020 DEXA 2016, 2018, 2020 T-2.8, 11/2021  T -2.6 - by Dr. Phineas Real, Osteoporosis, on break from bisphosphonates  PAP/pelvic: 10/2018 negative, Dr. Phineas Real  Influenza 2021 TD 2014 Pneumonia 2015 prevnar 13 has allergy - none further Zoster 2008 Covid 19: 3/3, 2021, pfizer  Names  of Other Physician/Practitioners you currently use: 1. [facility] here for primary care 2. Dr Rutherford Guys, eye doctor, last visit 2022 3. Dr Milas Gain, DDS, dentist, last visit 2022 & every 4 months 4. Dr. Elvera Lennox, derm, last visit 2021, goes annually, hx of basal cell   Patient Care Team: Unk Pinto, MD as PCP - General (Internal Medicine) Fay Records, MD as PCP - Cardiology (Cardiology) Ronald Lobo, MD as Consulting Physician (Gastroenterology) Rutherford Guys, MD as Consulting Physician (Ophthalmology) Clance, Armando Reichert, MD as Consulting Physician (Pulmonary Disease) Fontaine, Belinda Block, MD (Inactive) as Consulting Physician (Gynecology) Love, Alyson Locket, MD as Consulting Physician (Neurology) [name],  MD as Attending Physician (Neurology) Harriett Sine, MD as Consulting Physician (Dermatology)  Surgical History:  She has a past surgical history that includes Cesarean section (1961); Tonsillectomy and adenoidectomy; Appendectomy (1961); Total abdominal hysterectomy w/ bilateral salpingoophorectomy (1997); and cataract surg (Bilateral, 2021). Family History:  Herfamily history includes Blindness (age of onset: 32) in her sister; Cancer in her father and mother; Dementia in her mother; Diabetes in her sister; Heart disease in her brother, brother, brother, sister, and sister; Hypertension in her sister; Kidney failure in her brother and sister; Prostate cancer in her brother and brother; Skin cancer in her sister. Social History:  She reports that she has never smoked. She has never used smokeless tobacco. She reports that she does not currently use alcohol. She reports that she does not use drugs.   Review of Systems: Review of Systems  Constitutional:  Negative for malaise/fatigue and weight loss.  HENT:  Negative for hearing loss and tinnitus.   Eyes:  Negative for blurred vision and double vision.  Respiratory:  Negative for cough, sputum production, shortness of breath and wheezing.   Cardiovascular:  Negative for chest pain, palpitations, orthopnea, claudication, leg swelling and PND.  Gastrointestinal:  Negative for abdominal pain, blood in stool, constipation, diarrhea, heartburn, melena, nausea and vomiting.  Genitourinary: Negative.   Musculoskeletal:  Negative for falls, joint pain and myalgias.  Skin:  Negative for rash.  Neurological:  Positive for dizziness and tremors (Baseline, bilateral hands). Negative for tingling, sensory change, weakness and headaches.  Endo/Heme/Allergies:  Negative for polydipsia.  Psychiatric/Behavioral:  Negative for depression, memory loss, substance abuse and suicidal ideas. The patient is nervous/anxious and has insomnia.        Restless leg   All other systems reviewed and are negative.   Physical Exam: Estimated body mass index is 24.25 kg/m as calculated from the following:   Height as of this encounter: 5' 3.25" (1.607 m).   Weight as of this encounter: 138 lb (62.6 kg). BP 120/82   Pulse 65   Temp 97.7 F (36.5 C)   Ht 5' 3.25" (1.607 m)   Wt 138 lb (62.6 kg)   SpO2 97%   BMI 24.25 kg/m  General Appearance: Well nourished, in no apparent distress.  Eyes: PERRLA, EOMs, conjunctiva no swelling or erythema Sinuses: No Frontal/maxillary tenderness  ENT/Mouth: Ext aud canal with cerumen. Good dentition. No erythema, swelling, or exudate on post pharynx. Tonsils not swollen or erythematous. Hearing normal.  Neck: Supple, thyroid normal. No bruits  Respiratory: Respiratory effort normal, BS equal bilaterally without rales, rhonchi, wheezing or stridor.  Cardio: RRR without murmurs, rubs or gallops. Brisk peripheral pulses without edema.  Chest: symmetric, with normal excursions and percussion.  Breasts: Defer to GYN  Abdomen: Soft, nontender, no guarding, rebound, hernias, masses, or organomegaly.  Lymphatics:  Non tender without lymphadenopathy.  Genitourinary: Defer to GYN Musculoskeletal: Full ROM all peripheral extremities,5/5 strength, and normal gait. Mild kyphosis.   Skin: Warm, dry without rashes, concerning lesions, ecchymosis. Neuro: Cranial nerves intact, reflexes equal bilaterally. Normal muscle tone, no cerebellar symptoms,  tremor of bilateral hands, head tremor, sensation intact bil to monofilament Psych: Awake and oriented X 3, mildly anxious affect, Insight and Judgment appropriate.   EKG:  cardiology  follows annually, defer  Jamilah Jean E  3:28 PM [facility]

## 2022-08-28 ENCOUNTER — Ambulatory Visit (INDEPENDENT_AMBULATORY_CARE_PROVIDER_SITE_OTHER): Payer: Medicare PPO | Admitting: Nurse Practitioner

## 2022-08-28 ENCOUNTER — Encounter: Payer: Self-pay | Admitting: Nurse Practitioner

## 2022-08-28 VITALS — BP 120/82 | HR 65 | Temp 97.7°F | Ht 63.25 in | Wt 138.0 lb

## 2022-08-28 DIAGNOSIS — E559 Vitamin D deficiency, unspecified: Secondary | ICD-10-CM

## 2022-08-28 DIAGNOSIS — I471 Supraventricular tachycardia, unspecified: Secondary | ICD-10-CM

## 2022-08-28 DIAGNOSIS — I700 Atherosclerosis of aorta: Secondary | ICD-10-CM

## 2022-08-28 DIAGNOSIS — Z79899 Other long term (current) drug therapy: Secondary | ICD-10-CM

## 2022-08-28 DIAGNOSIS — M810 Age-related osteoporosis without current pathological fracture: Secondary | ICD-10-CM

## 2022-08-28 DIAGNOSIS — R7309 Other abnormal glucose: Secondary | ICD-10-CM | POA: Diagnosis not present

## 2022-08-28 DIAGNOSIS — I10 Essential (primary) hypertension: Secondary | ICD-10-CM | POA: Diagnosis not present

## 2022-08-28 DIAGNOSIS — E538 Deficiency of other specified B group vitamins: Secondary | ICD-10-CM | POA: Diagnosis not present

## 2022-08-28 DIAGNOSIS — Z0000 Encounter for general adult medical examination without abnormal findings: Secondary | ICD-10-CM | POA: Diagnosis not present

## 2022-08-28 DIAGNOSIS — N1832 Chronic kidney disease, stage 3b: Secondary | ICD-10-CM

## 2022-08-28 DIAGNOSIS — Z8673 Personal history of transient ischemic attack (TIA), and cerebral infarction without residual deficits: Secondary | ICD-10-CM

## 2022-08-28 DIAGNOSIS — E782 Mixed hyperlipidemia: Secondary | ICD-10-CM

## 2022-08-28 DIAGNOSIS — Z0001 Encounter for general adult medical examination with abnormal findings: Secondary | ICD-10-CM

## 2022-08-28 DIAGNOSIS — R42 Dizziness and giddiness: Secondary | ICD-10-CM

## 2022-08-28 DIAGNOSIS — F419 Anxiety disorder, unspecified: Secondary | ICD-10-CM

## 2022-08-28 DIAGNOSIS — I4510 Unspecified right bundle-branch block: Secondary | ICD-10-CM

## 2022-08-28 DIAGNOSIS — N281 Cyst of kidney, acquired: Secondary | ICD-10-CM

## 2022-08-28 DIAGNOSIS — Z8542 Personal history of malignant neoplasm of other parts of uterus: Secondary | ICD-10-CM

## 2022-08-28 DIAGNOSIS — N368 Other specified disorders of urethra: Secondary | ICD-10-CM

## 2022-08-28 DIAGNOSIS — Z86711 Personal history of pulmonary embolism: Secondary | ICD-10-CM

## 2022-08-28 DIAGNOSIS — Z86718 Personal history of other venous thrombosis and embolism: Secondary | ICD-10-CM

## 2022-08-28 DIAGNOSIS — Z85828 Personal history of other malignant neoplasm of skin: Secondary | ICD-10-CM

## 2022-08-28 MED ORDER — BUSPIRONE HCL 7.5 MG PO TABS: 7.5000 mg | ORAL_TABLET | Freq: Three times a day (TID) | ORAL | 1 refills | Status: DC

## 2022-08-28 NOTE — Patient Instructions (Signed)
Increase Buspar to 1 1/2 tab three times a day. If this is helping her anxiety notify the office and will send in a new prescription   Buspirone Tablets What is this medication? BUSPIRONE (byoo SPYE rone) treats anxiety. It works by balancing the levels of dopamine and serotonin in your brain, hormones that help regulate mood. This medicine may be used for other purposes; ask your health care provider or pharmacist if you have questions. COMMON BRAND NAME(S): BuSpar, Buspar Dividose What should I tell my care team before I take this medication? They need to know if you have any of these conditions: Kidney or liver disease An unusual or allergic reaction to buspirone, other medications, foods, dyes, or preservatives Pregnant or trying to get pregnant Breast-feeding How should I use this medication? Take this medication by mouth with a glass of water. Follow the directions on the prescription label. You may take this medication with or without food. To ensure that this medication always works the same way for you, you should take it either always with or always without food. Take your doses at regular intervals. Do not take your medication more often than directed. Do not stop taking except on the advice of your care team. Talk to your care team about the use of this medication in children. Special care may be needed. Overdosage: If you think you have taken too much of this medicine contact a poison control center or emergency room at once. NOTE: This medicine is only for you. Do not share this medicine with others. What if I miss a dose? If you miss a dose, take it as soon as you can. If it is almost time for your next dose, take only that dose. Do not take double or extra doses. What may interact with this medication? Do not take this medication with any of the following: Linezolid MAOIs like Carbex, Eldepryl, Marplan, Nardil, and Parnate Methylene blue Procarbazine This medication may  also interact with the following: Diazepam Digoxin Diltiazem Erythromycin Grapefruit juice Haloperidol Medications for mental depression or mood problems Medications for seizures like carbamazepine, phenobarbital and phenytoin Nefazodone Other medications for anxiety Rifampin Ritonavir Some antifungal medications like itraconazole, ketoconazole, and voriconazole Verapamil Warfarin This list may not describe all possible interactions. Give your health care provider a list of all the medicines, herbs, non-prescription drugs, or dietary supplements you use. Also tell them if you smoke, drink alcohol, or use illegal drugs. Some items may interact with your medicine. What should I watch for while using this medication? Visit your care team for regular checks on your progress. It may take 1 to 2 weeks before your anxiety gets better. You may get drowsy or dizzy. Do not drive, use machinery, or do anything that needs mental alertness until you know how this medication affects you. Do not stand or sit up quickly, especially if you are an older patient. This reduces the risk of dizzy or fainting spells. Alcohol can make you more drowsy and dizzy. Avoid alcoholic drinks. What side effects may I notice from receiving this medication? Side effects that you should report to your care team as soon as possible: Allergic reactions--skin rash, itching, hives, swelling of the face, lips, tongue, or throat Irritability, confusion, fast or irregular heartbeat, muscle stiffness, twitching muscles, sweating, high fever, seizure, chills, vomiting, diarrhea, which may be signs of serotonin syndrome Side effects that usually do not require medical attention (report to your care team if they continue or are bothersome): Anxiety or  nervousness Dizziness Drowsiness Headache Nausea Trouble sleeping This list may not describe all possible side effects. Call your doctor for medical advice about side effects. You may  report side effects to FDA at 1-800-FDA-1088. Where should I keep my medication? Keep out of the reach of children. Store at room temperature below 30 degrees C (86 degrees F). Protect from light. Keep container tightly closed. Throw away any unused medication after the expiration date. NOTE: This sheet is a summary. It may not cover all possible information. If you have questions about this medicine, talk to your doctor, pharmacist, or health care provider.  2023 Elsevier/Gold Standard (2021-02-05 00:00:00)

## 2022-08-29 LAB — URINALYSIS, ROUTINE W REFLEX MICROSCOPIC
Bilirubin Urine: NEGATIVE
Glucose, UA: NEGATIVE
Hgb urine dipstick: NEGATIVE
Ketones, ur: NEGATIVE
Leukocytes,Ua: NEGATIVE
Nitrite: NEGATIVE
Protein, ur: NEGATIVE
Specific Gravity, Urine: 1.012 (ref 1.001–1.035)
pH: 5.5 (ref 5.0–8.0)

## 2022-08-29 LAB — COMPLETE METABOLIC PANEL WITH GFR
AG Ratio: 2 (calc) (ref 1.0–2.5)
ALT: 17 U/L (ref 6–29)
AST: 19 U/L (ref 10–35)
Albumin: 4.3 g/dL (ref 3.6–5.1)
Alkaline phosphatase (APISO): 35 U/L — ABNORMAL LOW (ref 37–153)
BUN/Creatinine Ratio: 20 (calc) (ref 6–22)
BUN: 24 mg/dL (ref 7–25)
CO2: 29 mmol/L (ref 20–32)
Calcium: 9.9 mg/dL (ref 8.6–10.4)
Chloride: 101 mmol/L (ref 98–110)
Creat: 1.18 mg/dL — ABNORMAL HIGH (ref 0.60–0.95)
Globulin: 2.1 g/dL (calc) (ref 1.9–3.7)
Glucose, Bld: 105 mg/dL — ABNORMAL HIGH (ref 65–99)
Potassium: 4.7 mmol/L (ref 3.5–5.3)
Sodium: 137 mmol/L (ref 135–146)
Total Bilirubin: 0.6 mg/dL (ref 0.2–1.2)
Total Protein: 6.4 g/dL (ref 6.1–8.1)
eGFR: 44 mL/min/{1.73_m2} — ABNORMAL LOW (ref >=60)

## 2022-08-29 LAB — CBC WITH DIFFERENTIAL/PLATELET
Absolute Monocytes: 612 cells/uL (ref 200–950)
Basophils Absolute: 88 cells/uL (ref 0–200)
Basophils Relative: 1.3 %
Eosinophils Absolute: 163 cells/uL (ref 15–500)
Eosinophils Relative: 2.4 %
HCT: 47.4 % — ABNORMAL HIGH (ref 35.0–45.0)
Hemoglobin: 15.8 g/dL — ABNORMAL HIGH (ref 11.7–15.5)
Lymphs Abs: 2428 cells/uL (ref 850–3900)
MCH: 29.5 pg (ref 27.0–33.0)
MCHC: 33.3 g/dL (ref 32.0–36.0)
MCV: 88.4 fL (ref 80.0–100.0)
MPV: 9.9 fL (ref 7.5–12.5)
Monocytes Relative: 9 %
Neutro Abs: 3509 cells/uL (ref 1500–7800)
Neutrophils Relative %: 51.6 %
Platelets: 429 10*3/uL — ABNORMAL HIGH (ref 140–400)
RBC: 5.36 10*6/uL — ABNORMAL HIGH (ref 3.80–5.10)
RDW: 13.8 % (ref 11.0–15.0)
Total Lymphocyte: 35.7 %
WBC: 6.8 10*3/uL (ref 3.8–10.8)

## 2022-08-29 LAB — HEMOGLOBIN A1C
Hgb A1c MFr Bld: 5.5 % of total Hgb (ref <5.7)
Mean Plasma Glucose: 111 mg/dL
eAG (mmol/L): 6.2 mmol/L

## 2022-08-29 LAB — TSH: TSH: 1.88 mIU/L (ref 0.40–4.50)

## 2022-08-29 LAB — VITAMIN B12: Vitamin B-12: 655 pg/mL (ref 200–1100)

## 2022-08-29 LAB — MAGNESIUM: Magnesium: 2 mg/dL (ref 1.5–2.5)

## 2022-08-29 LAB — LIPID PANEL
Cholesterol: 124 mg/dL (ref <200)
HDL: 51 mg/dL (ref >=50)
LDL Cholesterol (Calc): 53 mg/dL (calc)
Non-HDL Cholesterol (Calc): 73 mg/dL (calc) (ref <130)
Total CHOL/HDL Ratio: 2.4 (calc) (ref <5.0)
Triglycerides: 122 mg/dL (ref <150)

## 2022-08-29 LAB — VITAMIN D 25 HYDROXY (VIT D DEFICIENCY, FRACTURES): Vit D, 25-Hydroxy: 114 ng/mL — ABNORMAL HIGH (ref 30–100)

## 2022-08-29 LAB — MICROALBUMIN / CREATININE URINE RATIO
Creatinine, Urine: 85 mg/dL (ref 20–275)
Microalb Creat Ratio: 2 mcg/mg creat (ref <30)
Microalb, Ur: 0.2 mg/dL

## 2022-09-02 ENCOUNTER — Encounter: Payer: Self-pay | Admitting: Nurse Practitioner

## 2022-09-04 DIAGNOSIS — H26491 Other secondary cataract, right eye: Secondary | ICD-10-CM | POA: Diagnosis not present

## 2022-09-18 DIAGNOSIS — H26492 Other secondary cataract, left eye: Secondary | ICD-10-CM | POA: Diagnosis not present

## 2022-10-01 ENCOUNTER — Encounter: Payer: Self-pay | Admitting: *Deleted

## 2022-10-01 ENCOUNTER — Encounter: Payer: Self-pay | Admitting: Neurology

## 2022-10-01 ENCOUNTER — Ambulatory Visit: Payer: Medicare PPO | Admitting: Neurology

## 2022-10-01 VITALS — BP 132/66 | HR 59 | Ht 63.0 in | Wt 135.6 lb

## 2022-10-01 DIAGNOSIS — G250 Essential tremor: Secondary | ICD-10-CM

## 2022-10-01 NOTE — Patient Instructions (Signed)
It was nice to see you again today.  As discussed, tremors do get worse over time.  Some days can be better than others, as you very well know.  We will look into getting approval for a device called [employer].  We have started the order form today.  It is a noninvasive and nonpharmacological (i.e. contains no medication) treatment with so-called neuromodulation, approved for essential tremor.  The mechanism is called TAPS (which stands for "transcutaneous afferent pattern stimulation").  The idea is, that hand tremor in the patients with essential tremor can be relieved by a certain frequency of oscillations.  The device stimulates the nerves at the wrist which then take the signal to the brain (specifically the thalamus) and the stimulation pattern is patient-specific. Monica Curry is a wrist-worn device that has electronics for stimulation and delivers a signal through 2 of the nerves at the wrist, namely the median nerve and radial nerves.   Please follow-up in 3 months to see Megan.

## 2022-10-01 NOTE — Progress Notes (Addendum)
Subjective:    Patient ID: Sumaiyah Markert is a 88 y.o. female.  HPI    Interim history:   Ms. Salmons is an 88 year old right-handed woman with an underlying medical history of uterine cancer, Hx of PE, anxiety, status post hysterectomy and tonsillectomy, hospitalization for suspected TIA in 10/2017, who presents for FU consultation of her tremor. I last saw her on 07/17/2021, at which time she reported instances of sudden sleepiness and dozing off without a warning.  She had been hospitalized in July 2022 for shortness of breath and was found to have a pulmonary embolus.  I advised her to taper off gradually her clonazepam.  She was worried about flareup of her anxiety.  I offered a referral to psychiatry which she declined.  She was also advised to taper off Topamax as she had side effects from it.  She was advised to start a taper on clonazepam.  I suggested we proceed with an EEG.  She had an EEG on 08/09/2021 and I reviewed the results: Impression: This is a normal EEG recording in the waking and drowsy state. No evidence of interictal epileptiform discharges seen. A normal EEG does not exclude a diagnosis of epilepsy.   She saw Vaughan Browner, NP on 04/02/2022, at which time she was advised to taper clonazepam.  In May 2023 she was advised to further reduce the clonazepam and eventually come off of it.  She was advised to have close follow-up for anxiety management with primary care.   She called in the interim in December 2022 reporting drowsiness and dizziness from the gabapentin.  She did not think it was helpful for her tremor, she was advised to come off of gabapentin.  She saw [name], NP in the interim on 10/02/2021, at which time she was advised to continue with low-dose clonazepam and start a trial of low-dose gabapentin.    Today, 10/01/2022: She reports having had some worsening of her tremor, some days are better than others.  She tries to hydrate well and does not drink  any caffeine, 1 cup of decaf coffee in the mornings only.  She drives, she is cautious with her driving and avoid highway driving and nighttime driving.  She continues to take propranolol, she does check her blood pressure at home.  She has been on Lexapro for anxiety as well and is low-dose buspirone.  Increase in buspirone dose caused her to have side effects.  She is on Lexapro 5 mg strength once daily, takes it in the evening as it made her drowsy during the day.  She is off of clonazepam.  The patient's allergies, current medications, family history, past medical history, past social history, past surgical history and problem list were reviewed and updated as appropriate.    Previously (copied from previous notes for reference):   The patient called in June 2022 reporting that she would fall asleep inadvertently during the day.  She was advised not to drive.  She was seen in follow-up on 10/02/2020 by [name], NP, at which time the patient reported benefit from taking Topamax at 25 mg once daily.  She continued to be on clonazepam and propranolol at the time.  She was advised to continue her medications.  She had a follow-up appointment with [name], NP on 03/27/2020, at which time she felt that her tremor had become worse.  She was advised to start low-dose Topamax at 25 mg strength once daily.  She saw [name], NP in the  interim on 07/21/2019, at which time she was advised to continue with her current medication regimen.  I saw her on 01/18/2019, at which time she reported increase in stress.  I suggested we continue with her current medication including her propranolol and low-dose clonazepam.  I did not feel it was a good idea to increase her clonazepam.  We talked about DBS evaluation and she requested a referral.  She wanted to stay in [place].  She was referred to Dr. Carles Collet.  She saw Dr. Carles Collet in consultation on 02/18/2019, at which time the patient did not want to pursue  surgical treatment.  She saw Dr. Leonie Man in follow-up after her recent hospital admission for TIA on 01/08/2018 and I reviewed his note. She was advised to have a Holter monitor for 1 month, she was advised to continue with adult size daily aspirin.    She was seen by [name], NP in the interim on 07/14/2018, at which time she was advised to continue with her propranolol and clonazepam. Patient reported that her PCP had increased her beta blocker after her TIA admission due to blood pressure increase but she was going to discuss reducing it back again.  I saw her on 01/19/2018, at which time we talked about her recent hospitalization. Her tremors were stable. She was advised to continue with her current doses of propranolol of clonazepam. Of note, she was hospitalized from 10/23/2017 through 10/24/2017 for a transient episode of expressive aphasia. She had TIA/stroke workup. I reviewed the hospital records and test results. MRI brain showed no acute intracranial process, MRA head showed no significant intracranial large vessel occlusion or stenosis. She had a small left ICA aneurysm and small right ICA aneurysm. Echocardiogram showed EF of 55-60 with grade 1 diastolic dysfunction. Carotid Doppler studies showed no significant ICA stenosis, she had no therapy needs. LDL was 105, A1c was 5.5.    She was started on a statin but discontinued it.   When she has an episode of lightheadedness and near fainting she feels like she is going to fall but she has not fallen. She does not actually have an episode of actual loss of consciousness. Interestingly, her sister had similar episodes and eventually had a pacemaker placed. Patient reports that she has chest tightness and it feels like her heart stops for moment.    She saw [name], NP in the interim on 07/16/2017, at which time she was deemed stable and advised to maintain on her current medication regimen for symptomatic control for her essential  tremor.   I saw her on 01/08/2017, at which time she was fairly stable and still indicated that clonazepam and Inderal were helpful. I suggested we continue with generic Inderal 20 mg 3 times a day and low-dose clonazepam 3 times a day.      I saw her on 01/08/2016, at which time she felt she was doing quite well, stable with her tremor overall but flareup notable when she was stressed out or when anxious. I suggested we continue with her current medication regimen for her tremors.   I saw her on 07/03/2015, at which time she reported having had C. 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 Tremor wise she felt stable and had no side effects on her medications. I suggested she continue with the same doses of propranolol and low-dose  clonazepam.   I saw her on 01/02/2015, at which time she reported doing well. She had tried the propranolol long-acting at one time in the past, but it did not work as well. She was prescribed trazodone 150 mg by her PCP. She tried 1/2 pill for about 2 weeks, but it did not help and made her drowsy during the day. She tried melatonin with no significant success, and she bought a white noise machine and stopped the melatonin. She had a reaction to Pneumovax in 2015 and did take the flu shot. I suggested she continue low-dose clonazepam and kept her on Inderal generic 20 mg 3 times a day.   I saw her on 06/30/14, at which time she reported doing well from the tremor standpoint. She was having trouble going to sleep and staying asleep. She was worried about her brother who had heart surgery and was in the hospital. I kept her on propranolol and clonazepam.   I saw her on 12/31/2013, at which time I felt she was stable. I did not change her medications. I suggested a routine six-month checkup. She reported volunteering at the  [facility]. She felt stable. She reported having a cousin with Parkinson's disease and one aunt with voice tremor. She had a bone density scan on 10/14/13 and it showed stable findings.   I first met her on 07/02/2013, at which time I felt her exam was stable and it did not change her medications. I had reviewed recent blood work and did not have any new test at the time. I renewed her prescriptions for propranolol and clonazepam at the time.     She previously followed with Dr. Morene Antu and last saw him on 12/31/2012, at which time he started her on propranolol.   She felt the propranolol 60 mg long-acting once daily was not as effective and was switched to propranolol 20 mg tid, clonazepam 0.5 mg tablet one in the morning, 0.25 mg at noon and 0.25 mg in the evening. Activity makes the tremor worse. She does note an improvement with alcohol and takes only one propranolol when she has had a drink. She drinks occasionally.   She has a history of tremors for many years, treated with clonazepam for a number of years, but tremor exacerbated in 1997 after her pulmonary embolus. She had withdrawal symptoms coming off of clonazepam. She had been on Mysoline but did not tolerate it. She has seen several other doctors for the tremor. TSH was negative in the past. She was tried on methazolamide without benefit. X ray lumbar spine from 03/24/13 showed T9 compression fracture, age indeterminate.    Her Past Medical History Is Significant For: Past Medical History:  Diagnosis Date   C. difficile colitis    Cancer ([place])    Endometrial cancer   Essential and other specified forms of tremor    Osteoporosis 11/2019   T score -2.8 stable from prior DEXA   Personal history of venous thrombosis and embolism 1997   Pulmonary embolism ([place]) 06/14/2021   Pulmonary embolus ([place])    Shingles    Thrush    TIA (transient ischemic attack)    carotid US 07/2022: no ICA stenosis bilaterally    Her Past Surgical  History Is Significant For: Past Surgical History:  Procedure Laterality Date   APPENDECTOMY  1961   cataract surg Bilateral 2021   Dr. Gershon Crane, R eye 8/18, L 8/25   CESAREAN SECTION  1961   TONSILLECTOMY AND ADENOIDECTOMY  TOTAL ABDOMINAL HYSTERECTOMY W/ BILATERAL SALPINGOOPHORECTOMY  1997    Her Family History Is Significant For: Family History  Problem Relation Age of Onset   Cancer Mother        Unknown type   Dementia Mother    Cancer Father        Bone   Heart disease Sister    Blindness Sister 42       temporal arteritis   Skin cancer Sister        basal and melanoma   Heart disease Sister    Kidney failure Sister        Kidney transplant   Diabetes Sister    Hypertension Sister    Heart disease Brother    Prostate cancer Brother        Prostate   Heart disease Brother    Kidney failure Brother    Prostate cancer Brother        Prostate   Heart disease Brother    Tremor Neg Hx    Parkinson's disease Neg Hx     Her Social History Is Significant For: Social History   Socioeconomic History   Marital status: Widowed    Spouse name: Not on file   Number of children: 1   Years of education: Not on file   Highest education level: Not on file  Occupational History   Not on file  Tobacco Use   Smoking status: Never   Smokeless tobacco: Never  Vaping Use   Vaping Use: Never used  Substance and Sexual Activity   Alcohol use: Not Currently    Alcohol/week: 0.0 standard drinks of alcohol   Drug use: No   Sexual activity: Not Currently    Birth control/protection: Post-menopausal, Surgical    Comment: HYST-1st intercourse 88 yo-Fewer than 5 partners  Other Topics Concern   Not on file  Social History Narrative   Not on file   Social Determinants of Health   Financial Resource Strain: Not on file  Food Insecurity: Not on file  Transportation Needs: Not on file  Physical Activity: Not on file  Stress: Not on file  Social Connections: Not on file     Her Allergies Are:  Allergies  Allergen Reactions   Codeine Hives   Cortisone     Causes jitteriness.   Dexamethasone     anxiety   Hctz [Hydrochlorothiazide] Other (See Comments)    Low sodium   Meloxicam Nausea Only    Gastritis and nausea   Mysoline [Primidone]    Pneumovax 23 [Pneumococcal Vac Polyvalent]     Red, raised area.  Knot at site of injection   Prednisone     Anxiety   Topiramate     Sleepiness, numbness feet tingling in arm.   Toradol [Ketorolac Tromethamine] Swelling    Localized redness at injection site.     Ace Inhibitors Cough  :   Her Current Medications Are:  Outpatient Encounter Medications as of 10/01/2022  Medication Sig   acetaminophen (TYLENOL) 500 MG tablet Take 500 mg by mouth every 6 (six) hours as needed for moderate pain.   apixaban (ELIQUIS) 5 MG TABS tablet Take  1 tablet 2 x/ day to Prevent Blood Clots   atorvastatin (LIPITOR) 10 MG tablet Take 1 tablet Daily for Cholesterol   busPIRone (BUSPAR) 7.5 MG tablet Take 1 tablet (7.5 mg total) by mouth 3 (three) times daily. (Patient taking differently: Take 5 mg by mouth 3 (three) times daily. Morning and evening  5 mg tablet 1200p 2.5 mg)   Calcium-Magnesium-Vitamin D 300-20-200 MG-MG-UNIT CHEW Chew 1 tablet daily by mouth.    Cholecalciferol (VITAMIN D3) 5000 units TABS Take 2 tablets by mouth daily.    escitalopram (LEXAPRO) 5 MG tablet TAKE 1 TABLET BY MOUTH DAILY FOR CHRONIC ANXIETY   Magnesium 250 MG TABS Take 250 mg by mouth 2 (two) times a day.   propranolol (INDERAL) 40 MG tablet TAKE 1 TABLET BY MOUTH FOUR TIMES DAILY AT BEDTIME WITH MEALS FOR BLOOD PRESSURE OR TREMORS   vitamin B-12 (CYANOCOBALAMIN) 500 MCG tablet Take 500 mcg by mouth daily. Sublingual   No facility-administered encounter medications on file as of 10/01/2022.  :  Review of Systems:  Out of a complete 14 point review of systems, all are reviewed and negative with the exception of these symptoms as listed  below:  Review of Systems  Neurological:        Pt here for tremors f/u Pt states she is having trouble eating and writing Pt states cannot lift glass if full of drink     Objective:  Neurological Exam  Physical Exam Physical Examination:   Vitals:   10/01/22 1035  BP: 132/66  Pulse: (!) 59    General Examination: The patient is a very pleasant 88 y.o. female in no acute distress. She appears well-nourished and well groomed.   HEENT: Normocephalic, atraumatic, extraocular tracking is well-preserved, face is symmetric, speech is clear without dysarthria, she has a mild voice tremor.  No carotid bruits.     Chest: Clear to auscultation without wheezing, rhonchi or crackles noted.   Heart: S1+S2+0, regular and normal without murmurs, rubs or gallops noted.   Abdomen: Soft, non-tender and non-distended.   Extremities: There is no obvious edema in the distal lower extremities bilaterally.   Skin: Warm and dry without trophic changes noted.   Musculoskeletal: exam reveals no obvious joint deformities.   Neurologically: Mental status: The patient is awake, alert and oriented in all 4 spheres. Her immediate and remote memory, attention, language skills and fund of knowledge are appropriate. There is no evidence of aphasia, agnosia, apraxia or anomia. Speech is clear with normal prosody and enunciation. Thought process is linear. Mood is normal and affect is normal. Cranial nerves II - XII are as described above under HEENT exam. Motor exam: Normal bulk, strength and tone is noted. There is no drift, or obvious resting tremor.  She has a stable appearing postural and action tremor in the upper extremities, right more noticeable than left. Cerebellar testing: No dysmetria or intention tremor. There is no truncal ataxia. Sensory exam: intact to light touch in the upper and lower extremities.   Assessment and Plan:    In summary, Aundreya Souffrant is an 88 year old female with an  underlying medical history of anxiety, osteoporosis, C. difficile colitis, endometrial cancer, history of DVT and PE, hospitalization in July 2022 for PE, hypertension, hyperlipidemia, and vitamin D deficiency, s/p C section, appendectomy, hysterectomy, tonsillectomy, and BSO, who presents for follow-up consultation of her essential tremor of several years' duration, with intolerance in the past to Mysoline, intolerance to Topamax, intolerance to gabapentin, currently on propranolol with mild bradycardia noted.  She gradually tapered off of clonazepam, and is now off of it.  She had developed increased daytime somnolence and episodes of falling asleep inadvertently.  An EEG on 08/09/2021 was normal for the awake and drowsy states.  For her anxiety, per PCP, she is now on low-dose generic  Lexapro to 5 mg strength and BuSpar 5 mg 3 times daily. She had consultation with Dr. Carles Collet for DBS evaluation for essential tremor, but she did not want to pursue surgical treatment in 2020.  Today we discussed the [facility] Trio neuromodulation device, which is approved for ET, we started the insurance approval form for this.  She was given additional patient information.  She would be willing to proceed, we will make sure she does not have any contraindication for this.  She is advised to follow-up to see Vaughan Browner, NP in about 3 months, sooner if needed.  I answered all her questions today and she was in agreement. I spent 30 minutes in total face-to-face time and in reviewing records during pre-charting, more than 50% of which was spent in counseling and coordination of care, reviewing test results, reviewing medications and treatment regimen and/or in discussing or reviewing the diagnosis of ET, the prognosis and treatment options. Pertinent laboratory and imaging test results that were available during this visit with the patient were reviewed by me and considered in my medical decision making (see chart for details).

## 2022-10-02 ENCOUNTER — Ambulatory Visit: Payer: Medicare PPO | Admitting: Neurology

## 2022-10-03 ENCOUNTER — Ambulatory Visit (INDEPENDENT_AMBULATORY_CARE_PROVIDER_SITE_OTHER): Payer: Medicare PPO

## 2022-10-03 ENCOUNTER — Telehealth: Payer: Self-pay | Admitting: *Deleted

## 2022-10-03 VITALS — Temp 97.5°F

## 2022-10-03 DIAGNOSIS — Z23 Encounter for immunization: Secondary | ICD-10-CM | POA: Diagnosis not present

## 2022-10-03 NOTE — Telephone Encounter (Signed)
[facility] Trio device order, insurance, and office note has been faxed to [employer]. Received a receipt of confirmation.

## 2022-10-11 ENCOUNTER — Encounter: Payer: Self-pay | Admitting: Nurse Practitioner

## 2022-10-11 ENCOUNTER — Encounter: Payer: Self-pay | Admitting: Neurology

## 2022-10-29 NOTE — Telephone Encounter (Signed)
Received a fax from [facility], asking for a peer to peer for pt on the [employer] device. Placed in your inbox for review and phone # attached.

## 2022-11-04 ENCOUNTER — Encounter: Payer: Self-pay | Admitting: Nurse Practitioner

## 2022-11-05 ENCOUNTER — Encounter: Payer: Self-pay | Admitting: Neurology

## 2022-11-20 ENCOUNTER — Other Ambulatory Visit: Payer: Self-pay | Admitting: Nurse Practitioner

## 2022-11-26 NOTE — Progress Notes (Signed)
3 MONTH FOLLOW UP  Assessment and Plan:   Atherosclerosis of aorta (HCC) - per CT 06/2021 Control blood pressure, cholesterol, glucose, increase exercise.   Essential hypertension Avoid over controlling BP, historically labile Continue propranolol QID Monitor blood pressure at home; call if consistently over 150/90 Continue DASH diet.   Reminder to go to the ER if any CP, SOB, nausea, dizziness, severe HA, changes vision/speech, left arm numbness and tingling and jaw pain.  Paroxysmal SVT (HCC) Continue BB; no recurrence; cardiology follows  History of TIA (transient ischemic attack) Continue eliquis; follow up with neurology Control blood pressure, cholesterol, glucose, increase exercise.   Essential tremor  Continue follow up with neuro, on inderal, off of topamax and klonopin   HT CKD 3 (HCC) Family history of kidney failure in 2 siblings Bilateral renal cysts Increase fluids, avoid NSAIDS, monitor sugars, will monitor Nephrology is following - CMP/GFR  Vitamin D deficiency Continue supplementation -Vit D  Hx/o DVT/PE Continue eliquis  No bleeding concerns, denies falls   Mixed hyperlipidemia Well controlled; continue statin Continue low cholesterol diet and exercise.  Check lipid panel q3-6 months  Other abnormal glucose Discussed disease and risks Discussed diet/exercise, weight management  Check A1C annually  Anxiety Lexapro 5 mg daily,decrease Buspar to 1/2 tab in am Start hydroxyzine as needed for anxiety Stress management techniques discussed, increase water, good sleep hygiene discussed, increase exercise, and increase veggies.   Trapezius muscle spasm Use warm heat and Baclofen as needed   Discussed med's effects and SE's. labs and tests as requested with regular follow-up as recommended. Over 30 minutes of exam, counseling, chart review, and complex, high level critical decision making was performed this visit.   Future Appointments  Date Time  Provider Department Center  01/16/2023  3:00 PM Butch Penny, NP [facility] None  03/05/2023  2:30 PM [name], NP [facility] None  09/01/2023  2:00 PM Raynelle Dick, NP [facility] None    HPI  88 y.o. female  presents for 4 month follow up for has History of endometrial cancer; Hx/o DVT/PE; Osteoporosis; Urethral prolapse; Essential hypertension; Hyperlipidemia, mixed; Abnormal glucose; Vitamin D deficiency; Essential tremor; Medication management; Chronic kidney disease, stage 3b (HCC); History of TIA (transient ischemic attack); B12 deficiency; History of skin cancer (basal cell); Complete right bundle branch block; Paroxysmal supraventricular tachycardia; Bilateral renal cysts; Aortic atherosclerosis (HCC) - CT 06/2021; Anxiety; and Thyroid nodule on their problem list.   Hx of DVTs, more recently she was found to have bilateral PEs on CTA 07/07/2021, hematology recommended indefinite NOAC unless bleeding contraindication, continues on eliquis 5 mg BID.   She has followed with neuro for tremors, Dr. Frances Furbish.  She now takes propranolol 40 mg QID   Anxiety has been ongoing concern; she reported panic episodes after tapered down long term klonopin per neuro prescriber. She was initiated on lexapro 5 mg and does notice some excessive sleepiness so does not want to increase the dose. She was on  buspar 5 mg TID . Uncertain if it has provided any relief of anxiety.  Will plan to cut down to 1/2 Buspar in the AM.  She has been experiencing an aching sensation in the left side of neck into head.  Will come and ho.  Can go days without the pain and then returns . Tylenol does help.   She has had issues with restless legs.  Has tried compression socks, heat .  Had tried restful legs supplement, stopped because of uncertain ingredients.  She  stopped Buspar and restless leg improved.   BMI is Body mass index is 23.99 kg/m., she has not been working on diet, appetite has been better Weight down from  150 lb 09/2021.  Wt Readings from Last 3 Encounters:  11/27/22 135 lb 6.4 oz (61.4 kg)  10/01/22 135 lb 9.6 oz (61.5 kg)  08/28/22 138 lb (62.6 kg)   Her blood pressure has been controlled at home (more elevated since tapering off of klonopin), today [name] is BP: 138/66  BP Readings from Last 3 Encounters:  11/27/22 138/66  10/01/22 132/66  08/28/22 120/82  She does not workout. She denies chest pain, shortness of breath, dizziness.   Follows with Dr. Tenny Craw after evaluation for episodes of syncope; holter in 2019 showed short burst of SVT, now on propranolol, 2D echo showed LVEF 55-60% with grade 1 DD. Normal myocardial perfusion in 2019. No further episodes.  She as aortic atherosclerosis per CT 06/2021.   She is on cholesterol medication (atorvastatin 10 mg daily) and denies myalgias. Her cholesterol is at goal. The cholesterol last visit was:   Lab Results  Component Value Date   CHOL 124 08/28/2022   HDL 51 08/28/2022   LDLCALC 53 08/28/2022   TRIG 122 08/28/2022   CHOLHDL 2.4 08/28/2022   She has been working on diet and exercise for glucose management, she is on bASA, and denies foot ulcerations, increased appetite, nausea, paresthesia of the feet, polydipsia, polyuria, visual disturbances, vomiting and weight loss. Last A1C in the office was:  Lab Results  Component Value Date   HGBA1C 5.5 08/28/2022   [name], family history of kidney failure in brother and sister 75-80 fluid ounces, daily, no alcohol, no NSAIDs, no hematuria or stone hx US showed simple renal cysts, markedly small L kidney Saw [facility] Dr. Thedore Mins who recommends monitoring only at this time; follows annually, has upcoming in March 2023.  Last GFR: BP Readings from Last 3 Encounters:  11/27/22 138/66  10/01/22 132/66  08/28/22 120/82     Patient is on Vitamin D supplement   Lab Results  Component Value Date   VD25OH 114 (H) 08/28/2022     She is on supplement, taking 1000 mcg tab  sublingual daily.  Lab Results  Component Value Date   VITAMINB12 655 08/28/2022    Current Medications:  Current Outpatient Medications on File Prior to Visit  Medication Sig Dispense Refill   acetaminophen (TYLENOL) 500 MG tablet Take 500 mg by mouth every 6 (six) hours as needed for moderate pain.     apixaban (ELIQUIS) 5 MG TABS tablet Take  1 tablet 2 x/ day to Prevent Blood Clots 180 tablet 3   atorvastatin (LIPITOR) 10 MG tablet Take 1 tablet Daily for Cholesterol 90 tablet 3   busPIRone (BUSPAR) 7.5 MG tablet Take 1 tablet (7.5 mg total) by mouth 3 (three) times daily. (Patient taking differently: Take 5 mg by mouth 3 (three) times daily. Morning and evening 5 mg tablet 1200p 2.5 mg) 90 tablet 1   CALCIUM PO Take by mouth.     Cholecalciferol (VITAMIN D3) 5000 units TABS Take 2 tablets by mouth daily.      escitalopram (LEXAPRO) 5 MG tablet TAKE 1 TABLET BY MOUTH DAILY FOR CHRONIC ANXIETY 90 tablet 0   Magnesium 250 MG TABS Take 250 mg by mouth 2 (two) times a day.     propranolol (INDERAL) 40 MG tablet TAKE 1 TABLET BY MOUTH FOUR TIMES DAILY  AT BEDTIME WITH MEALS FOR BLOOD PRESSURE OR TREMORS 360 tablet 3   vitamin B-12 (CYANOCOBALAMIN) 500 MCG tablet Take 500 mcg by mouth daily. Sublingual     Calcium-Magnesium-Vitamin D 300-20-200 MG-MG-UNIT CHEW Chew 1 tablet daily by mouth.  (Patient not taking: Reported on 11/27/2022)     No current facility-administered medications on file prior to visit.   Allergies:  Allergies  Allergen Reactions   Codeine Hives   Cortisone     Causes jitteriness.   Dexamethasone     anxiety   Hctz [Hydrochlorothiazide] Other (See Comments)    Low sodium   Meloxicam Nausea Only    Gastritis and nausea   Mysoline [Primidone]    Pneumovax 23 [Pneumococcal Vac Polyvalent]     Red, raised area.  Knot at site of injection   Prednisone     Anxiety   Topiramate     Sleepiness, numbness feet tingling in arm.   Toradol [Ketorolac Tromethamine]  Swelling    Localized redness at injection site.     Ace Inhibitors Cough   Medical History:  She has History of endometrial cancer; Hx/o DVT/PE; Osteoporosis; Urethral prolapse; Essential hypertension; Hyperlipidemia, mixed; Abnormal glucose; Vitamin D deficiency; Essential tremor; Medication management; Chronic kidney disease, stage 3b (HCC); History of TIA (transient ischemic attack); B12 deficiency; History of skin cancer (basal cell); Complete right bundle branch block; Paroxysmal supraventricular tachycardia; Bilateral renal cysts; Aortic atherosclerosis (HCC) - CT 06/2021; Anxiety; and Thyroid nodule on their problem list.  Surgical History:  She has a past surgical history that includes Cesarean section (1961); Tonsillectomy and adenoidectomy; Appendectomy (1961); Total abdominal hysterectomy w/ bilateral salpingoophorectomy (1997); and cataract surg (Bilateral, 2021). Family History:  Herfamily history includes Blindness (age of onset: 62) in her sister; Cancer in her father and mother; Dementia in her mother; Diabetes in her sister; Heart disease in her brother, brother, brother, sister, and sister; Hypertension in her sister; Kidney failure in her brother and sister; Prostate cancer in her brother and brother; Skin cancer in her sister. Social History:  She reports that she has never smoked. She has never used smokeless tobacco. She reports that she does not currently use alcohol. She reports that she does not use drugs.   Review of Systems: Review of Systems  Constitutional:  Negative for malaise/fatigue and weight loss.  HENT:  Negative for hearing loss and tinnitus.   Eyes:  Negative for blurred vision and double vision.  Respiratory:  Negative for cough, sputum production, shortness of breath and wheezing.   Cardiovascular:  Negative for chest pain, palpitations, orthopnea, claudication, leg swelling and PND.  Gastrointestinal:  Negative for abdominal pain, blood in stool,  constipation, diarrhea (mild loose stools, no blood or mucus), heartburn, melena, nausea and vomiting.  Genitourinary: Negative.   Musculoskeletal:  Positive for neck pain. Negative for falls, joint pain and myalgias.  Skin:  Negative for rash.  Neurological:  Positive for tremors (Baseline, bilateral hands). Negative for dizziness, tingling, sensory change, weakness and headaches.  Endo/Heme/Allergies:  Negative for polydipsia.  Psychiatric/Behavioral:  Negative for depression, memory loss, substance abuse and suicidal ideas. The patient is nervous/anxious (worse with actively tapering off of klonopin) and has insomnia.   All other systems reviewed and are negative.   Physical Exam: Estimated body mass index is 23.99 kg/m as calculated from the following:   Height as of this encounter: 5\' 3"  (1.6 m).   Weight as of this encounter: 135 lb 6.4 oz (61.4 kg). BP 138/66  Pulse 73   Temp (!) 97.3 F (36.3 C)   Ht 5\' 3"  (1.6 m)   Wt 135 lb 6.4 oz (61.4 kg)   SpO2 98%   BMI 23.99 kg/m  General Appearance: Well nourished, in no apparent distress.  Eyes: PERRLA, EOMs, conjunctiva no swelling or erythema Sinuses: No Frontal/maxillary tenderness  ENT/Mouth: Ext aud canal with cerumen. Good dentition. No erythema, swelling, or exudate on post pharynx. Tonsils not swollen or erythematous. Hearing normal.  Neck: Supple, thyroid normal. No bruits  Respiratory: Respiratory effort normal, BS equal bilaterally without rales, rhonchi, wheezing or stridor.  Cardio: RRR without murmurs, rubs or gallops. Brisk peripheral pulses without edema.  Abdomen: Soft, nontender, no guarding, rebound, hernias, masses, or organomegaly.  Lymphatics: Non tender without lymphadenopathy.  Musculoskeletal: Full ROM all peripheral extremities,5/5 strength, and normal gait. Mild kyphosis.  Left trapezius muscle spasm Skin: Warm, dry without rashes, concerning lesions, ecchymosis. Neuro: Cranial nerves intact, reflexes  equal bilaterally. Normal muscle tone, no cerebellar symptoms, subtle tremor of bilateral hands, head tremor Psych: Awake and oriented X 3, mildly anxious affect, Insight and Judgment appropriate.   Raynelle Dick, NP-C 2:45 PM [facility]

## 2022-11-27 ENCOUNTER — Ambulatory Visit: Payer: Medicare PPO | Admitting: Nurse Practitioner

## 2022-11-27 ENCOUNTER — Encounter: Payer: Self-pay | Admitting: Nurse Practitioner

## 2022-11-27 VITALS — BP 138/66 | HR 73 | Temp 97.3°F | Ht 63.0 in | Wt 135.4 lb

## 2022-11-27 DIAGNOSIS — E559 Vitamin D deficiency, unspecified: Secondary | ICD-10-CM

## 2022-11-27 DIAGNOSIS — R7309 Other abnormal glucose: Secondary | ICD-10-CM | POA: Diagnosis not present

## 2022-11-27 DIAGNOSIS — Z86718 Personal history of other venous thrombosis and embolism: Secondary | ICD-10-CM

## 2022-11-27 DIAGNOSIS — M62838 Other muscle spasm: Secondary | ICD-10-CM

## 2022-11-27 DIAGNOSIS — E782 Mixed hyperlipidemia: Secondary | ICD-10-CM

## 2022-11-27 DIAGNOSIS — Z79899 Other long term (current) drug therapy: Secondary | ICD-10-CM | POA: Diagnosis not present

## 2022-11-27 DIAGNOSIS — I700 Atherosclerosis of aorta: Secondary | ICD-10-CM | POA: Diagnosis not present

## 2022-11-27 DIAGNOSIS — Z86711 Personal history of pulmonary embolism: Secondary | ICD-10-CM

## 2022-11-27 DIAGNOSIS — I471 Supraventricular tachycardia, unspecified: Secondary | ICD-10-CM

## 2022-11-27 DIAGNOSIS — I10 Essential (primary) hypertension: Secondary | ICD-10-CM

## 2022-11-27 DIAGNOSIS — N1832 Chronic kidney disease, stage 3b: Secondary | ICD-10-CM

## 2022-11-27 DIAGNOSIS — G250 Essential tremor: Secondary | ICD-10-CM

## 2022-11-27 DIAGNOSIS — F419 Anxiety disorder, unspecified: Secondary | ICD-10-CM

## 2022-11-27 DIAGNOSIS — N281 Cyst of kidney, acquired: Secondary | ICD-10-CM

## 2022-11-27 MED ORDER — BACLOFEN 10 MG PO TABS: 10.0000 mg | ORAL_TABLET | Freq: Two times a day (BID) | ORAL | 1 refills | Status: DC

## 2022-11-27 MED ORDER — HYDROXYZINE HCL 25 MG PO TABS: 25.0000 mg | ORAL_TABLET | Freq: Four times a day (QID) | ORAL | 0 refills | Status: DC | PRN

## 2022-11-27 NOTE — Patient Instructions (Addendum)
Baclofen twice a day as needed for muscle pain-can cause drowsiness.  Muscle Cramps and Spasms Muscle cramps and spasms are when muscles tighten by themselves. They usually get better within minutes. Muscle cramps are painful. They are usually stronger and last longer than muscle spasms. Muscle spasms may or may not be painful. They can last a few seconds or much longer. Cramps and spasms can affect any muscle, but they occur most often in the calf muscles of the leg. They are usually not caused by a serious problem. In many cases, the cause is not known. Some common causes include: Doing more physical work or exercise than your body is ready for. Using the muscles too much (overuse) by repeating certain movements too many times. Staying in a certain position for a long time. Playing a sport or doing an activity without preparing properly. Using bad form or technique while playing a sport or doing an activity. Not having enough water in your body (dehydration). Injury. Side effects of some medicines. Low levels of the salts and minerals in your blood (electrolytes), such as low potassium or calcium. Follow these instructions at home: Managing pain and stiffness     Massage, stretch, and relax the muscle. Do this for many minutes at a time. If told, put heat on tight or tense muscles as often as told by your doctor. Use the heat source that your doctor recommends, such as a moist heat pack or a heating pad. Place a towel between your skin and the heat source. Leave the heat on for 20-30 minutes. Remove the heat if your skin turns bright red. This is very important if you are not able to feel pain, heat, or cold. You may have a greater risk of getting burned. If told, put ice on the affected area. This may help if you are sore or have pain after a cramp or spasm. Put ice in a plastic bag. Place a towel between your skin and the bag. Leave the ice on for 20 minutes, 2-3 times a day. Try  taking hot showers or baths to help relax tight muscles. Eating and drinking Drink enough fluid to keep your pee (urine) pale yellow. Eat a healthy diet to help ensure that your muscles work well. This should include: Fruits and vegetables. Lean protein. Whole grains. Low-fat or nonfat dairy products. General instructions If you are having cramps often, avoid intense exercise for several days. Take over-the-counter and prescription medicines only as told by your doctor. Watch for any changes in your symptoms. Keep all follow-up visits as told by your doctor. This is important. Contact a doctor if: Your cramps or spasms get worse or happen more often. Your cramps or spasms do not get better with time. Summary Muscle cramps and spasms are when muscles tighten by themselves. They usually get better within minutes. Cramps and spasms occur most often in the calf muscles of the leg. Massage, stretch, and relax the muscle. This may help the cramp or spasm go away. Drink enough fluid to keep your pee (urine) pale yellow. This information is not intended to replace advice given to you by your health care provider. Make sure you discuss any questions you have with your health care provider. Document Revised: 06/15/2021 Document Reviewed: 06/15/2021 Elsevier Patient Education  [place].   Hydroxyzine Capsules or Tablets What is this medication? HYDROXYZINE (hye [place] i zeen) treats the symptoms of allergies and allergic reactions. It may also be used to treat anxiety or  cause drowsiness before a procedure. It works by blocking histamine, a substance released by the body during an allergic reaction. It belongs to a group of medications called antihistamines. This medicine may be used for other purposes; ask your health care provider or pharmacist if you have questions. COMMON BRAND NAME(S): ANX, Atarax, Rezine, Vistaril What should I tell my care team before I take this medication? They  need to know if you have any of these conditions: Glaucoma Heart disease Irregular heartbeat or rhythm Kidney disease Liver disease Lung or breathing disease, such as asthma Stomach or intestine problems Thyroid disease Trouble passing urine An unusual or allergic reaction to hydroxyzine, other medications, foods, dyes or preservatives Pregnant or trying to get pregnant Breastfeeding How should I use this medication? Take this medication by mouth with a full glass of water. Take it as directed on the prescription label at the same time every day. You can take it with or without food. If it upsets your stomach, take it with food. Talk to your care team about the use of this medication in children. While it may be prescribed for children as young as 6 years for selected conditions, precautions do apply. People 65 years and older may have a stronger reaction and need a smaller dose. Overdosage: If you think you have taken too much of this medicine contact a poison control center or emergency room at once. NOTE: This medicine is only for you. Do not share this medicine with others. What if I miss a dose? If you miss a dose, take it as soon as you can. If it is almost time for your next dose, take only that dose. Do not take double or extra doses. What may interact with this medication? Do not take this medication with any of the following: Cisapride Dronedarone Pimozide Thioridazine This medication may also interact with the following: Alcohol Antihistamines for allergy, cough, and cold Atropine Barbiturate medications for sleep or seizures, such as phenobarbital Certain antibiotics, such as erythromycin or clarithromycin Certain medications for anxiety or sleep Certain medications for bladder problems, such as oxybutynin or tolterodine Certain medications for irregular heartbeat Certain medications for mental health conditions Certain medications for Parkinson disease, such as  benztropine, trihexyphenidyl Certain medications for seizures, such as phenobarbital or primidone Certain medications for stomach problems, such as dicyclomine or hyoscyamine Certain medications for travel sickness, such as scopolamine Ipratropium Opioid medications for pain Other medications that cause heart rhythm changes, such as dofetilide This list may not describe all possible interactions. Give your health care provider a list of all the medicines, herbs, non-prescription drugs, or dietary supplements you use. Also tell them if you smoke, drink alcohol, or use illegal drugs. Some items may interact with your medicine. What should I watch for while using this medication? Visit your care team for regular checks on your progress. Tell your care team if your symptoms do not start to get better or if they get worse. This medication may affect your coordination, reaction time, or judgment. Do not drive or operate machinery until you know how this medication affects you. Sit up or stand slowly to reduce the risk of dizzy or fainting spells. Drinking alcohol with this medication can increase the risk of these side effects. Your mouth may get dry. Chewing sugarless gum or sucking hard candy and drinking plenty of water may help. Contact your care team if the problem does not go away or is severe. This medication may cause dry eyes and blurred  vision. If you wear contact lenses, you may feel some discomfort. Lubricating eye drops may help. See your care team if the problem does not go away or is severe. If you are receiving skin tests for allergies, tell your care team you are taking this medication. What side effects may I notice from receiving this medication? Side effects that you should report to your care team as soon as possible: Allergic reactions--skin rash, itching, hives, swelling of the face, lips, tongue, or throat Heart rhythm changes--fast or irregular heartbeat, dizziness, feeling faint  or lightheaded, chest pain, trouble breathing Side effects that usually do not require medical attention (report to your care team if they continue or are bothersome): Confusion Drowsiness Dry mouth Hallucinations Headache This list may not describe all possible side effects. Call your doctor for medical advice about side effects. You may report side effects to FDA at 1-800-FDA-1088. Where should I keep my medication? Keep out of the reach of children and pets. Store at room temperature between 15 and 30 degrees C (59 and 86 degrees F). Keep container tightly closed. Throw away any unused medication after the expiration date. NOTE: This sheet is a summary. It may not cover all possible information. If you have questions about this medicine, talk to your doctor, pharmacist, or health care provider.  2023 Elsevier/Gold Standard (2008-01-16 00:00:00)

## 2022-11-28 ENCOUNTER — Encounter: Payer: Self-pay | Admitting: Nurse Practitioner

## 2022-11-28 LAB — COMPLETE METABOLIC PANEL WITH GFR
AG Ratio: 1.9 (calc) (ref 1.0–2.5)
ALT: 11 U/L (ref 6–29)
AST: 17 U/L (ref 10–35)
Albumin: 3.9 g/dL (ref 3.6–5.1)
Alkaline phosphatase (APISO): 35 U/L — ABNORMAL LOW (ref 37–153)
BUN/Creatinine Ratio: 21 (calc) (ref 6–22)
BUN: 23 mg/dL (ref 7–25)
CO2: 26 mmol/L (ref 20–32)
Calcium: 9.4 mg/dL (ref 8.6–10.4)
Chloride: 103 mmol/L (ref 98–110)
Creat: 1.1 mg/dL — ABNORMAL HIGH (ref 0.60–0.95)
Globulin: 2.1 g/dL (calc) (ref 1.9–3.7)
Glucose, Bld: 137 mg/dL — ABNORMAL HIGH (ref 65–99)
Potassium: 4.6 mmol/L (ref 3.5–5.3)
Sodium: 139 mmol/L (ref 135–146)
Total Bilirubin: 0.7 mg/dL (ref 0.2–1.2)
Total Protein: 6 g/dL — ABNORMAL LOW (ref 6.1–8.1)
eGFR: 48 mL/min/{1.73_m2} — ABNORMAL LOW (ref >=60)

## 2022-11-28 LAB — LIPID PANEL
Cholesterol: 117 mg/dL (ref <200)
HDL: 47 mg/dL — ABNORMAL LOW (ref >=50)
LDL Cholesterol (Calc): 44 mg/dL (calc)
Non-HDL Cholesterol (Calc): 70 mg/dL (calc) (ref <130)
Total CHOL/HDL Ratio: 2.5 (calc) (ref <5.0)
Triglycerides: 184 mg/dL — ABNORMAL HIGH (ref <150)

## 2022-11-28 LAB — CBC WITH DIFFERENTIAL/PLATELET
Absolute Monocytes: 488 cells/uL (ref 200–950)
Basophils Absolute: 78 cells/uL (ref 0–200)
Basophils Relative: 1.2 %
Eosinophils Absolute: 202 cells/uL (ref 15–500)
Eosinophils Relative: 3.1 %
HCT: 45.4 % — ABNORMAL HIGH (ref 35.0–45.0)
Hemoglobin: 15.3 g/dL (ref 11.7–15.5)
Lymphs Abs: 2009 cells/uL (ref 850–3900)
MCH: 30.1 pg (ref 27.0–33.0)
MCHC: 33.7 g/dL (ref 32.0–36.0)
MCV: 89.2 fL (ref 80.0–100.0)
MPV: 10.2 fL (ref 7.5–12.5)
Monocytes Relative: 7.5 %
Neutro Abs: 3725 cells/uL (ref 1500–7800)
Neutrophils Relative %: 57.3 %
Platelets: 410 10*3/uL — ABNORMAL HIGH (ref 140–400)
RBC: 5.09 10*6/uL (ref 3.80–5.10)
RDW: 13.8 % (ref 11.0–15.0)
Total Lymphocyte: 30.9 %
WBC: 6.5 10*3/uL (ref 3.8–10.8)

## 2022-11-28 LAB — MAGNESIUM: Magnesium: 1.9 mg/dL (ref 1.5–2.5)

## 2022-11-28 LAB — VITAMIN D 25 HYDROXY (VIT D DEFICIENCY, FRACTURES): Vit D, 25-Hydroxy: 108 ng/mL — ABNORMAL HIGH (ref 30–100)

## 2022-12-05 DIAGNOSIS — D2271 Melanocytic nevi of right lower limb, including hip: Secondary | ICD-10-CM | POA: Diagnosis not present

## 2022-12-05 DIAGNOSIS — Z85828 Personal history of other malignant neoplasm of skin: Secondary | ICD-10-CM | POA: Diagnosis not present

## 2022-12-05 DIAGNOSIS — L570 Actinic keratosis: Secondary | ICD-10-CM | POA: Diagnosis not present

## 2022-12-05 DIAGNOSIS — L821 Other seborrheic keratosis: Secondary | ICD-10-CM | POA: Diagnosis not present

## 2022-12-05 DIAGNOSIS — L820 Inflamed seborrheic keratosis: Secondary | ICD-10-CM | POA: Diagnosis not present

## 2022-12-05 DIAGNOSIS — D2261 Melanocytic nevi of right upper limb, including shoulder: Secondary | ICD-10-CM | POA: Diagnosis not present

## 2022-12-05 DIAGNOSIS — D1801 Hemangioma of skin and subcutaneous tissue: Secondary | ICD-10-CM | POA: Diagnosis not present

## 2022-12-05 DIAGNOSIS — L814 Other melanin hyperpigmentation: Secondary | ICD-10-CM | POA: Diagnosis not present

## 2022-12-05 DIAGNOSIS — D2272 Melanocytic nevi of left lower limb, including hip: Secondary | ICD-10-CM | POA: Diagnosis not present

## 2022-12-18 ENCOUNTER — Encounter: Payer: Self-pay | Admitting: Nurse Practitioner

## 2022-12-26 DIAGNOSIS — H524 Presbyopia: Secondary | ICD-10-CM | POA: Diagnosis not present

## 2022-12-26 DIAGNOSIS — Z961 Presence of intraocular lens: Secondary | ICD-10-CM | POA: Diagnosis not present

## 2023-01-07 DIAGNOSIS — Z1231 Encounter for screening mammogram for malignant neoplasm of breast: Secondary | ICD-10-CM | POA: Diagnosis not present

## 2023-01-07 LAB — HM MAMMOGRAPHY

## 2023-01-08 ENCOUNTER — Encounter: Payer: Self-pay | Admitting: Internal Medicine

## 2023-01-14 ENCOUNTER — Ambulatory Visit: Payer: Medicare PPO | Admitting: Nurse Practitioner

## 2023-01-16 ENCOUNTER — Encounter: Payer: Self-pay | Admitting: Adult Health

## 2023-01-16 ENCOUNTER — Telehealth: Payer: Self-pay | Admitting: Adult Health

## 2023-01-16 ENCOUNTER — Ambulatory Visit: Payer: Medicare PPO | Admitting: Adult Health

## 2023-01-16 VITALS — BP 123/67 | HR 63 | Ht 63.5 in | Wt 134.0 lb

## 2023-01-16 DIAGNOSIS — G250 Essential tremor: Secondary | ICD-10-CM

## 2023-01-16 NOTE — Progress Notes (Signed)
PATIENT: Monica Curry DOB: [birth year]  REASON FOR VISIT: follow up HISTORY FROM: patient  Chief Complaint  Patient presents with   RM 4    Patient is here with follow-up for tremor. She wasn't able to get [employer] device because medicare won't cover it since the device is considered experimental. Tremors seem to be a little worse per pt report. She also wants to mention that she has issues with restless legs at night. It takes her 2-3 hours to fall asleep.     HISTORY OF PRESENT ILLNESS: Today 45/40/98:  Monica Curry is a 89 y.o. female with a history of essential tremor. Returns today for follow-up.   Trying to eat is difficult. She feels like she has isolated herself because she doesn't want to eat in front of others. Taking Hydroxyzine has helped but makes her sleepy so she can't take but 1 tablet.  Currently on propranolol.  Tired multiple medications but has had side effects to most.  Tried Topamax and it helped but it made her sleepy and caused tingling in arm, leg. Tried Gabapentin, mysoline and klonopin. Klonopin helped but concerned about potential Side effects.   10/01/2022: She reports having had some worsening of her tremor, some days are better than others.  She tries to hydrate well and does not drink any caffeine, 1 cup of decaf coffee in the mornings only.  She drives, she is cautious with her driving and avoid highway driving and nighttime driving.  She continues to take propranolol, she does check her blood pressure at home.  She has been on Lexapro for anxiety as well and is low-dose buspirone.  Increase in buspirone dose caused her to have side effects.  She is on Lexapro 5 mg strength once daily, takes it in the evening as it made her drowsy during the day.  She is off of clonazepam.    04/02/22: Monica Curry is a 89 year old female with a history of essential tremor.  She returns today for follow-up.  Feels that tremor is the same. Notices it with eating,  writing. Has to use 2 hands when eating at times. Right hand worse than left. Tried Gabapentin but did not feel that it offered her any benefit. When she was on Topamax she noticed benefit but had side effects- sleeping issues and numbness in the toes. Thought she was having a CVA and went to the hospital but no stroke seen. Tried Primidone but couldn't tolerate.  She has only found clonazepam beneficial.  She does acknowledge that she has high anxiety.  For that reason she also has anxiety about reducing her dose of clonazepam. Twisted knee in January- using cane. Reports that she recently got injection in the left shoulder.  10/02/21: Monica Curry is an 89 year old female with a history of essential tremor.  She returns today for follow-up.  At the last visit she was complaining of daytime sleepiness.  She was weaned off of Topamax and her Klonopin dose was decreased to half a tablet 3 times a day.  She reports that daytime sleepiness has improved.  She states that her reduction in clonazepam increased her anxiety.  Her PCP has placed her on BuSpar and Lexapro.  She feels that this is helped some.  The patient also feels that her tremor has gotten worse.  Right hand greater than left hand.  She has tried using weighted utensils and a weighted pen but does not find them helpful.  She notices it with  her handwriting and with eating.  She would like to be on something to help with her tremor.  She does understand that most of the medication does have a side effect of drowsiness.  10/02/20: Monica Curry is an 89 year old female with a history of essential tremor.  She returns today for follow-up she continues on clonazepam and propranolol.  At the last visit Topamax 25 mg was added.  She states that this has been extremely beneficial.  She states that her daughter just recently visited and noticed the difference in her tremors.  The tremor primarily is in the hands right greater than left.  She sometimes will  notice it with her handwriting.  She is able to complete all ADLs independently.  She returns today for an evaluation.   HISTORY 03/27/20:   Monica Curry is an 89 year old female with a history of essential tremor.  She returns today for follow-up.  She continues on clonazepam and propranolol.  She feels that her tremor has worsened.  She states that she has to hold a glass with 2 hands.  Her handwriting has gotten worse.  She did try to get a weighted glove but felt that it offered her no benefit.  She is able to complete ADLs without help.  She returns today for an evaluation.    REVIEW OF SYSTEMS: Out of a complete 14 system review of symptoms, the patient complains only of the following symptoms, and all other reviewed systems are negative.  See HPI  ALLERGIES: Allergies  Allergen Reactions   Codeine Hives   Cortisone     Causes jitteriness.   Dexamethasone     anxiety   Hctz [Hydrochlorothiazide] Other (See Comments)    Low sodium   Meloxicam Nausea Only    Gastritis and nausea   Mysoline [Primidone]    Pneumovax 23 [Pneumococcal Vac Polyvalent]     Red, raised area.  Knot at site of injection   Prednisone     Anxiety   Topiramate     Sleepiness, numbness feet tingling in arm.   Toradol [Ketorolac Tromethamine] Swelling    Localized redness at injection site.     Ace Inhibitors Cough    HOME MEDICATIONS: Outpatient Medications Prior to Visit  Medication Sig Dispense Refill   acetaminophen (TYLENOL) 500 MG tablet Take 500 mg by mouth every 6 (six) hours as needed for moderate pain.     apixaban (ELIQUIS) 5 MG TABS tablet Take  1 tablet 2 x/ day to Prevent Blood Clots 180 tablet 3   atorvastatin (LIPITOR) 10 MG tablet Take 1 tablet Daily for Cholesterol 90 tablet 3   CALCIUM PO Take by mouth.     Cholecalciferol (VITAMIN D3) 5000 units TABS Take 2 tablets by mouth daily.      escitalopram (LEXAPRO) 5 MG tablet TAKE 1 TABLET BY MOUTH DAILY FOR CHRONIC ANXIETY 90 tablet  0   hydrOXYzine (ATARAX) 25 MG tablet Take 1 tablet (25 mg total) by mouth every 6 (six) hours as needed for itching. 90 tablet 0   Magnesium 250 MG TABS Take 250 mg by mouth 2 (two) times a day.     propranolol (INDERAL) 40 MG tablet TAKE 1 TABLET BY MOUTH FOUR TIMES DAILY AT BEDTIME WITH MEALS FOR BLOOD PRESSURE OR TREMORS 360 tablet 3   vitamin B-12 (CYANOCOBALAMIN) 500 MCG tablet Take 500 mcg by mouth daily. Sublingual     baclofen (LIORESAL) 10 MG tablet Take 1 tablet (10 mg total) by mouth 2 (  two) times daily. Take 1/2 to 1 tablet 2 x day if needed for muscle spasm (Patient not taking: Reported on 01/16/2023) 60 tablet 1   busPIRone (BUSPAR) 7.5 MG tablet Take 1 tablet (7.5 mg total) by mouth 3 (three) times daily. (Patient taking differently: Take 5 mg by mouth 3 (three) times daily. Morning and evening 5 mg tablet 1200p 2.5 mg) 90 tablet 1   No facility-administered medications prior to visit.    PAST MEDICAL HISTORY: Past Medical History:  Diagnosis Date   C. difficile colitis    Cancer [facility])    Endometrial cancer   Essential and other specified forms of tremor    Osteoporosis 11/2019   T score -2.8 stable from prior DEXA   Personal history of venous thrombosis and embolism 1997   Pulmonary embolism ([place]) 06/14/2021   Pulmonary embolus ([place])    Shingles    Thrush    TIA (transient ischemic attack)    carotid US 07/2022: no ICA stenosis bilaterally    PAST SURGICAL HISTORY: Past Surgical History:  Procedure Laterality Date   APPENDECTOMY  1961   cataract surg Bilateral 2021   Dr. Gershon Crane, R eye 8/18, L 8/25   CESAREAN SECTION  1961   TONSILLECTOMY AND ADENOIDECTOMY     TOTAL ABDOMINAL HYSTERECTOMY W/ BILATERAL SALPINGOOPHORECTOMY  1997    FAMILY HISTORY: Family History  Problem Relation Age of Onset   Cancer Mother        Unknown type   Dementia Mother    Cancer Father        Bone   Heart disease Sister    Blindness Sister 12       temporal arteritis   Skin  cancer Sister        basal and melanoma   Heart disease Sister    Kidney failure Sister        Kidney transplant   Diabetes Sister    Hypertension Sister    Heart disease Brother    Prostate cancer Brother        Prostate   Heart disease Brother    Kidney failure Brother    Prostate cancer Brother        Prostate   Heart disease Brother    Tremor Neg Hx    Parkinson's disease Neg Hx     SOCIAL HISTORY: Social History   Socioeconomic History   Marital status: Widowed    Spouse name: Not on file   Number of children: 1   Years of education: Not on file   Highest education level: Not on file  Occupational History   Not on file  Tobacco Use   Smoking status: Never   Smokeless tobacco: Never  Vaping Use   Vaping Use: Never used  Substance and Sexual Activity   Alcohol use: Not Currently    Alcohol/week: 0.0 standard drinks of alcohol   Drug use: No   Sexual activity: Not Currently    Birth control/protection: Post-menopausal, Surgical    Comment: HYST-1st intercourse 89 yo-Fewer than 5 partners  Other Topics Concern   Not on file  Social History Narrative   Not on file   Social Determinants of Health   Financial Resource Strain: Not on file  Food Insecurity: Not on file  Transportation Needs: Not on file  Physical Activity: Not on file  Stress: Not on file  Social Connections: Not on file  Intimate Partner Violence: Not on file      PHYSICAL EXAM  Vitals:   01/16/23 1508  BP: 123/67  Pulse: 63  Weight: 134 lb (60.8 kg)  Height: 5' 3.5" (1.613 m)    Body mass index is 23.36 kg/m.  Generalized: Well developed, in no acute distress   Neurological examination  Mentation: Alert oriented to time, place, history taking. Follows all commands speech and language fluent Cranial nerve II-XII: Pupils were equal round reactive to light. Extraocular movements were full, visual field were full on confrontational test. Facial sensation and strength were normal.  Head turning and shoulder shrug  were normal and symmetric. Tremor noted in jaw.  Motor: The motor testing reveals 5 over 5 strength of all 4 extremities. Good symmetric motor tone is noted throughout.  Sensory: Sensory testing is intact to soft touch on all 4 extremities. No evidence of extinction is noted.  Intention tremor noted in the upper extremities right greater than left Coordination: Cerebellar testing reveals good finger-nose-finger and heel-to-shin bilaterally.  Action tremor noted in the upper extremities.  Right greater than left Gait and station: Uses a cane when ambulating.   DIAGNOSTIC DATA (LABS, IMAGING, TESTING) - I reviewed patient records, labs, notes, testing and imaging myself where available.  Lab Results  Component Value Date   WBC 6.5 11/27/2022   HGB 15.3 11/27/2022   HCT 45.4 (H) 11/27/2022   MCV 89.2 11/27/2022   PLT 410 (H) 11/27/2022      Component Value Date/Time   NA 139 11/27/2022 1514   K 4.6 11/27/2022 1514   CL 103 11/27/2022 1514   CO2 26 11/27/2022 1514   GLUCOSE 137 (H) 11/27/2022 1514   BUN 23 11/27/2022 1514   CREATININE 1.10 (H) 11/27/2022 1514   CALCIUM 9.4 11/27/2022 1514   PROT 6.0 (L) 11/27/2022 1514   ALBUMIN 3.6 07/07/2021 0158   AST 17 11/27/2022 1514   ALT 11 11/27/2022 1514   ALKPHOS 30 (L) 07/07/2021 0158   BILITOT 0.7 11/27/2022 1514   GFRNONAA 39 (L) 07/07/2021 0158   GFRNONAA 39 (L) 06/13/2021 1529   GFRAA 45 (L) 06/13/2021 1529   Lab Results  Component Value Date   CHOL 117 11/27/2022   HDL 47 (L) 11/27/2022   LDLCALC 44 11/27/2022   TRIG 184 (H) 11/27/2022   CHOLHDL 2.5 11/27/2022   Lab Results  Component Value Date   HGBA1C 5.5 08/28/2022   Lab Results  Component Value Date   VITAMINB12 655 08/28/2022   Lab Results  Component Value Date   TSH 1.88 08/28/2022      ASSESSMENT AND PLAN 89 y.o. year old female  has a past medical history of C. difficile colitis, Cancer ([place]), Essential and other  specified forms of tremor, Osteoporosis (11/2019), Personal history of venous thrombosis and embolism (1997), Pulmonary embolism ([place]) (06/14/2021), Pulmonary embolus ([place]), Shingles, Thrush, and TIA (transient ischemic attack). here with:  1. Essential Tremor  -Advised patient to bring [place] the denial letter for the [facility] device and we will see if we can do an appeal for her. -Patient would like to get a second opinion from Dr. Carles Collet.  She saw her before the pandemic and they discuss deep brain stimulator however then the pandemic happened so she delayed this. -Advised patient we could see her back in 6 months or sooner if needed    [name], MSN, NP-C 01/16/2023, 3:07 PM [facility] [address], [place], [place] [address] [phone]

## 2023-01-16 NOTE — Telephone Encounter (Signed)
Referral Neurology sent through [facility] to [facility] [place]. Phone: [phone]

## 2023-01-20 ENCOUNTER — Encounter: Payer: Self-pay | Admitting: *Deleted

## 2023-01-22 ENCOUNTER — Encounter: Payer: Self-pay | Admitting: *Deleted

## 2023-01-23 NOTE — Telephone Encounter (Signed)
Faxed to [facility] appeal letter for [facility] device. (Address for appeals is [address], [place] [address].

## 2023-01-27 ENCOUNTER — Other Ambulatory Visit: Payer: Self-pay | Admitting: Nurse Practitioner

## 2023-01-27 DIAGNOSIS — F419 Anxiety disorder, unspecified: Secondary | ICD-10-CM

## 2023-02-13 NOTE — Telephone Encounter (Signed)
Received decision from the [facility] reviewer. 01-31-2023. They denied the appeal stating that the current medical literature show that the evidence is insufficient to determine that this service is standard medical treatment. Our review says that the requested items/services are not considered medically reasonable and necessary in this case.  Our physician reviewer found that is insufficient evidence in the literature that this device is effective for the enrollee's tremor.   Appeal  # [id number].

## 2023-02-17 ENCOUNTER — Other Ambulatory Visit: Payer: Self-pay | Admitting: Nurse Practitioner

## 2023-02-27 DIAGNOSIS — N1831 Chronic kidney disease, stage 3a: Secondary | ICD-10-CM | POA: Diagnosis not present

## 2023-03-05 ENCOUNTER — Ambulatory Visit: Payer: Medicare PPO | Admitting: Nurse Practitioner

## 2023-03-05 DIAGNOSIS — I129 Hypertensive chronic kidney disease with stage 1 through stage 4 chronic kidney disease, or unspecified chronic kidney disease: Secondary | ICD-10-CM | POA: Diagnosis not present

## 2023-03-05 DIAGNOSIS — N281 Cyst of kidney, acquired: Secondary | ICD-10-CM | POA: Diagnosis not present

## 2023-03-05 DIAGNOSIS — N2581 Secondary hyperparathyroidism of renal origin: Secondary | ICD-10-CM | POA: Diagnosis not present

## 2023-03-05 DIAGNOSIS — N1831 Chronic kidney disease, stage 3a: Secondary | ICD-10-CM | POA: Diagnosis not present

## 2023-03-05 DIAGNOSIS — D631 Anemia in chronic kidney disease: Secondary | ICD-10-CM | POA: Diagnosis not present

## 2023-03-10 ENCOUNTER — Ambulatory Visit: Payer: Medicare PPO | Admitting: Nurse Practitioner

## 2023-03-19 ENCOUNTER — Ambulatory Visit (INDEPENDENT_AMBULATORY_CARE_PROVIDER_SITE_OTHER): Payer: Medicare PPO | Admitting: Nurse Practitioner

## 2023-03-19 ENCOUNTER — Encounter: Payer: Self-pay | Admitting: Nurse Practitioner

## 2023-03-19 VITALS — BP 120/64 | HR 57 | Temp 97.6°F | Ht 63.0 in | Wt 135.0 lb

## 2023-03-19 DIAGNOSIS — N281 Cyst of kidney, acquired: Secondary | ICD-10-CM

## 2023-03-19 DIAGNOSIS — R6889 Other general symptoms and signs: Secondary | ICD-10-CM

## 2023-03-19 DIAGNOSIS — Z86718 Personal history of other venous thrombosis and embolism: Secondary | ICD-10-CM

## 2023-03-19 DIAGNOSIS — N1832 Chronic kidney disease, stage 3b: Secondary | ICD-10-CM | POA: Diagnosis not present

## 2023-03-19 DIAGNOSIS — E559 Vitamin D deficiency, unspecified: Secondary | ICD-10-CM | POA: Diagnosis not present

## 2023-03-19 DIAGNOSIS — I4510 Unspecified right bundle-branch block: Secondary | ICD-10-CM

## 2023-03-19 DIAGNOSIS — G250 Essential tremor: Secondary | ICD-10-CM | POA: Diagnosis not present

## 2023-03-19 DIAGNOSIS — E782 Mixed hyperlipidemia: Secondary | ICD-10-CM

## 2023-03-19 DIAGNOSIS — I471 Supraventricular tachycardia, unspecified: Secondary | ICD-10-CM | POA: Diagnosis not present

## 2023-03-19 DIAGNOSIS — R7309 Other abnormal glucose: Secondary | ICD-10-CM

## 2023-03-19 DIAGNOSIS — I700 Atherosclerosis of aorta: Secondary | ICD-10-CM | POA: Diagnosis not present

## 2023-03-19 DIAGNOSIS — N368 Other specified disorders of urethra: Secondary | ICD-10-CM | POA: Diagnosis not present

## 2023-03-19 DIAGNOSIS — Z0000 Encounter for general adult medical examination without abnormal findings: Secondary | ICD-10-CM

## 2023-03-19 DIAGNOSIS — M810 Age-related osteoporosis without current pathological fracture: Secondary | ICD-10-CM | POA: Diagnosis not present

## 2023-03-19 DIAGNOSIS — Z8542 Personal history of malignant neoplasm of other parts of uterus: Secondary | ICD-10-CM | POA: Diagnosis not present

## 2023-03-19 DIAGNOSIS — I10 Essential (primary) hypertension: Secondary | ICD-10-CM | POA: Diagnosis not present

## 2023-03-19 DIAGNOSIS — Z0001 Encounter for general adult medical examination with abnormal findings: Secondary | ICD-10-CM | POA: Diagnosis not present

## 2023-03-19 DIAGNOSIS — Z79899 Other long term (current) drug therapy: Secondary | ICD-10-CM | POA: Diagnosis not present

## 2023-03-19 DIAGNOSIS — Z85828 Personal history of other malignant neoplasm of skin: Secondary | ICD-10-CM

## 2023-03-19 DIAGNOSIS — F13939 Sedative, hypnotic or anxiolytic use, unspecified with withdrawal, unspecified: Secondary | ICD-10-CM

## 2023-03-19 DIAGNOSIS — E538 Deficiency of other specified B group vitamins: Secondary | ICD-10-CM

## 2023-03-19 DIAGNOSIS — F419 Anxiety disorder, unspecified: Secondary | ICD-10-CM

## 2023-03-19 NOTE — Progress Notes (Signed)
AWV and FOLLOW UP  Assessment and Plan:  Annual Medicare Wellness Visit Due annually  Health maintenance reviewed Health lifestyle goals set  Atherosclerosis of aorta (HCC) - per CT 06/2021 Control blood pressure, cholesterol, glucose, increase exercise.   Essential hypertension Discussed DASH (Dietary Approaches to Stop Hypertension) DASH diet is lower in sodium than a typical American diet. Cut back on foods that are high in saturated fat, cholesterol, and trans fats. Eat more whole-grain foods, fish, poultry, and nuts Remain active and exercise as tolerated daily.  Monitor BP at home-Call if greater than 130/80.  Check CMP/CBC   Complete RBBB Followed by cardiology; monitor   Paroxysmal SVT (HCC) Continue BB; no recurrence; cardiology follows  History of TIA (transient ischemic attack) Continue eliquis; follow up with neurology Control blood pressure, cholesterol, glucose, increase exercise.   Essential tremor  Continue follow up with neuro Continue medications.  Osteoporosis, unspecified osteoporosis type, unspecified pathological fracture presence Due for DEXA 11/2023 GYN following Pursue a combination of weight-bearing exercises and strength training. Patients with severe mobility impairment should be referred for physical therapy. Advised on fall prevention measures including proper lighting in all rooms, removal of area rugs and floor clutter, use of walking devices as deemed appropriate, avoidance of uneven walking surfaces. Smoking cessation and moderate alcohol consumption if applicable Consume 800 to 1000 IU of vitamin D daily with a goal vitamin D serum value of 30 ng/mL or higher. Aim for 1000 to 1200 mg of elemental calcium daily through supplements and/or dietary sources.   History of endometrial cancer S/p hysterectomy; continue follow up with GYN  Urethral prolapse S/p hysterectomy; continue follow up with GYN  CKD 3 (HCC)/Bilateral renal  cysts Discussed how what you eat and drink can aide in kidney protection. Stay well hydrated. Avoid high salt foods. Avoid NSAIDS. Keep BP and BG well controlled.   Take medications as prescribed. Remain active and exercise as tolerated daily. Maintain weight.  Continue to monitor. Check CMP/GFR/Microablumin  Vitamin D deficiency Continue supplement for goal of 60-100 Monitor Vitamin D levels  Hx/o DVT/PE Continue eliquis  No bleeding concerns, denies falls   Mixed hyperlipidemia Discussed lifestyle modifications. Recommended diet heavy in fruits and veggies, omega 3's. Decrease consumption of animal meats, cheeses, and dairy products. Remain active and exercise as tolerated. Continue to monitor. Check lipids/TSH  Other abnormal glucose Education: Reviewed 'ABCs' of diabetes management  Discussed goals to be met and/or maintained include A1C (<7) Blood pressure (<130/80) Cholesterol (LDL <70) Continue Eye Exam yearly  Continue Dental Exam Q6 mo Discussed dietary recommendations Discussed Physical Activity recommendations Check A1C  B12 deficiency Continue supplement Monitor levels  Personal history of skin cancer (basal cell) Followed by derm No concerns  Anxiety Continue medications.   Reviewed relaxation techniques.  Sleep hygiene. Recommended Cognitive Behavioral Therapy (CBT). Recommended mindfulness meditation and exercise.   Encouraged personality growth wand development through coping techniques and problem-solving skills. Limit/Decrease/Monitor drug/alcohol intake.    Sedative Withdrawal Resolved  Medication management All medications discussed and reviewed in full. All questions and concerns regarding medications addressed.    Orders Placed This Encounter  Procedures   CBC with Differential/Platelet   COMPLETE METABOLIC PANEL WITH GFR   Lipid panel   Hemoglobin A1c   VITAMIN D 25 Hydroxy (Vit-D Deficiency, Fractures)    Notify office  for further evaluation and treatment, questions or concerns if any reported s/s fail to improve.   The patient was advised to call back or seek an in-person  evaluation if any symptoms worsen or if the condition fails to improve as anticipated.   Further disposition pending results of labs. Discussed med's effects and SE's.    I discussed the assessment and treatment plan with the patient. The patient was provided an opportunity to ask questions and all were answered. The patient agreed with the plan and demonstrated an understanding of the instructions.  Discussed med's effects and SE's. Screening labs and tests as requested with regular follow-up as recommended.  I provided 35 minutes of face-to-face time during this encounter including counseling, chart review, and critical decision making was preformed.  Today's Plan of Care is based on a patient-centered health care approach known as shared decision making - the decisions, tests and treatments allow for patient preferences and values to be balanced with clinical evidence.     Future Appointments  Date Time Provider Department Center  08/07/2023  1:30 PM Butch Penny, NP [facility] None  09/01/2023  2:00 PM Raynelle Dick, NP [facility] None  03/09/2024  4:00 PM [name], NP [facility] None    Plan:   During the course of the visit the patient was educated and counseled about appropriate screening and preventive services including:   Pneumococcal vaccine  Prevnar 13 Influenza vaccine Td vaccine Screening electrocardiogram Bone densitometry screening Colorectal cancer screening Diabetes screening Glaucoma screening Nutrition counseling  Advanced directives: requested    HPI  89 y.o. female  presents for AWV and follow up for has History of endometrial cancer; Hx/o DVT/PE; Osteoporosis; Urethral prolapse; Essential hypertension; Hyperlipidemia, mixed; Abnormal glucose; Vitamin D deficiency; Essential tremor; Medication  management; Chronic kidney disease, stage 3b; History of TIA (transient ischemic attack); B12 deficiency; History of skin cancer (basal cell); Complete right bundle branch block; Paroxysmal supraventricular tachycardia; Bilateral renal cysts; Aortic atherosclerosis (HCC) - CT 06/2021; Anxiety; and Thyroid nodule on their problem list.   She is widowed, 1 living child, daughter, lives in [place]. She has 2 grandchildren. Sister just passed in 01/2023.  Her mood is currently stable.  Daughter visits every month now.     She had a thyroid nodule noted on imaging by cardiology, they ordered US 08/01/2021 that showed (TI-RADS 3) located in the inferior right thyroid lobe that was recommended for FNA, had this on 08/29/2021 showing Consistent with benign follicular nodule (Bethesda category II) and no further follow up was recommended.   Hx of DVTs, recently found to have bilateral PEs on CTA 07/07/2021, hematology recommended indefinite NOAC unless bleeding contraindication.   The patient was admitted in 10/2017 for a L Brain TIA w/ transient expressive aphasia, workup unremarkable. She is followed by Neurology. She presented to ED on 07/07/2021 c/o tingling of left upper and lower extremity and hypertensive emergency. They found no appreciable deficits on exam, underwent unremarkable CT, MRI, stable/unchanged since 2018 with moderate nonspecific cerebral white matter signal changes. She had normal EEG (08/10/2021).   She has followed with neuro for tremors, Dr. Frances Furbish. Has been on propranolol, topamax, klonapin but tapered off/down topamax due to sedation/ paresthesias, sx did resolve. She now takes propranolol 40 mg QID and valium  Anxiety has been ongoing concern; she reported panic episodes after tapered down long term klonopin per neuro prescriber. Had seen psych in the past but with limited perceived benefit and stopped. She was initiated on lexapro 5 mg and given buspar 5 mg TID in the short term, sx improved and  has tapered off of buspar.  Currently controlled with Lexapro.  She was  also followed by Dr. Claretta FraiseFontine (GYN) and Dr. Penni BombardKendall, goes annually who manages PAPs, [facility] DEXA. Hx of endometrial cancer s/p hysterectomy, urethral prolapse. She is currently on drug break from bisphosphonate and prolia for osteoporosis. Last DEXA 2022 with Tscore -2.6.  BMI is Body mass index is 23.91 kg/m., she has been working on diet, enjoys walking outdoors and around her home. Wt Readings from Last 3 Encounters:  03/19/23 135 lb (61.2 kg)  01/16/23 134 lb (60.8 kg)  11/27/22 135 lb 6.4 oz (61.4 kg)   Her blood pressure has been controlled at home, today [name] is BP: 120/64 She does not workout. She denies chest pain, shortness of breath, dizziness. Olmesartan was increased to 10 mg due to labile hypertension with recent anxiety; doing well.   Follows with Dr. Tenny Crawoss after evaluation for episodes of syncope; holter in 2019 showed short burst of SVT, now on propranolol, 2D echo showed LVEF 55-60% with grade 1 DD. Normal myocardial perfusion in 2019. No further episodes.  She as aortic atherosclerosis per CT 06/2021.   She is on cholesterol medication (atorvastatin 10 mg daily) and denies myalgias. Her cholesterol is at goal. The cholesterol last visit was:   Lab Results  Component Value Date   CHOL 117 11/27/2022   HDL 47 (L) 11/27/2022   LDLCALC 44 11/27/2022   TRIG 184 (H) 11/27/2022   CHOLHDL 2.5 11/27/2022   She has been working on diet and exercise for glucose management, she is on bASA, and denies foot ulcerations, increased appetite, nausea, paresthesia of the feet, polydipsia, polyuria, visual disturbances, vomiting and weight loss. Last A1C in the office was:  Lab Results  Component Value Date   HGBA1C 5.5 08/28/2022   [name], family history of kidney failure in brother and sister 75-80 fluid ounces, daily, no alcohol, no NSAIDs, no hematuria or stone hx US showed simple renal  cysts, markedly small L kidney Saw [facility] Dr. Thedore MinsSingh who recommends monitoring only at this time; follows annually. Last GFR: Lab Results  Component Value Date   GFRNONAA 39 (L) 07/07/2021   GFRNONAA 38 (L) 06/16/2021   GFRNONAA 53 (L) 06/15/2021   Lab Results  Component Value Date   CREATININE 1.10 (H) 11/27/2022   CREATININE 1.18 (H) 08/28/2022   CREATININE 1.08 (H) 05/15/2022   Patient is on Vitamin D supplement   Lab Results  Component Value Date   VD25OH 108 (H) 11/27/2022     She is on supplement, taking 1000 mcg tab sublingual daily.  Lab Results  Component Value Date   VITAMINB12 655 08/28/2022    Current Medications:  Current Outpatient Medications on File Prior to Visit  Medication Sig Dispense Refill   acetaminophen (TYLENOL) 500 MG tablet Take 500 mg by mouth every 6 (six) hours as needed for moderate pain.     apixaban (ELIQUIS) 5 MG TABS tablet Take  1 tablet 2 x/ day to Prevent Blood Clots 180 tablet 3   atorvastatin (LIPITOR) 10 MG tablet Take 1 tablet Daily for Cholesterol 90 tablet 3   CALCIUM PO Take by mouth.     Cholecalciferol (VITAMIN D3) 5000 units TABS Take 2 tablets by mouth daily.      escitalopram (LEXAPRO) 5 MG tablet TAKE 1 TABLET BY MOUTH DAILY FOR CHRONIC ANXIETY 90 tablet 0   hydrOXYzine (ATARAX) 25 MG tablet TAKE 1 TABLET(25 MG) BY MOUTH EVERY 6 HOURS AS NEEDED FOR ITCHING 90 tablet 0   Magnesium 250  MG TABS Take 250 mg by mouth 2 (two) times a day.     propranolol (INDERAL) 40 MG tablet TAKE 1 TABLET BY MOUTH FOUR TIMES DAILY AT BEDTIME WITH MEALS FOR BLOOD PRESSURE OR TREMORS 360 tablet 3   vitamin B-12 (CYANOCOBALAMIN) 500 MCG tablet Take 500 mcg by mouth daily. Sublingual     baclofen (LIORESAL) 10 MG tablet Take 1 tablet (10 mg total) by mouth 2 (two) times daily. Take 1/2 to 1 tablet 2 x day if needed for muscle spasm (Patient not taking: Reported on 01/16/2023) 60 tablet 1   No current facility-administered medications on file  prior to visit.   Allergies:  Allergies  Allergen Reactions   Codeine Hives   Cortisone     Causes jitteriness.   Dexamethasone     anxiety   Hctz [Hydrochlorothiazide] Other (See Comments)    Low sodium   Meloxicam Nausea Only    Gastritis and nausea   Mysoline [Primidone]    Pneumovax 23 [Pneumococcal Vac Polyvalent]     Red, raised area.  Knot at site of injection   Prednisone     Anxiety   Topiramate     Sleepiness, numbness feet tingling in arm.   Toradol [Ketorolac Tromethamine] Swelling    Localized redness at injection site.     Ace Inhibitors Cough   Medical History:  She has History of endometrial cancer; Hx/o DVT/PE; Osteoporosis; Urethral prolapse; Essential hypertension; Hyperlipidemia, mixed; Abnormal glucose; Vitamin D deficiency; Essential tremor; Medication management; Chronic kidney disease, stage 3b; History of TIA (transient ischemic attack); B12 deficiency; History of skin cancer (basal cell); Complete right bundle branch block; Paroxysmal supraventricular tachycardia; Bilateral renal cysts; Aortic atherosclerosis (HCC) - CT 06/2021; Anxiety; and Thyroid nodule on their problem list.  Health Maintenance:   Immunization History  Administered Date(s) Administered   H1N1 01/03/2009   Influenza Whole 09/23/2013   Influenza, High Dose Seasonal PF 09/06/2014, 08/23/2015, 08/21/2016, 08/28/2017, 09/01/2018, 09/14/2019, 09/19/2020, 10/02/2021, 10/03/2022   PFIZER(Purple Top)SARS-COV-2 Vaccination 12/29/2019, 01/16/2020, 09/06/2020, 05/04/2021   PPD Test 03/29/2014   Pneumococcal Polysaccharide-23 03/29/2014   Pneumococcal-Unspecified 12/09/2002   Td 03/23/2013   Zoster, Live 12/09/2006   Health Maintenance  Topic Date Due   COVID-19 Vaccine (5 - 2023-24 season) 08/09/2022   Medicare Annual Wellness (AWV)  01/10/2023   DTaP/Tdap/Td (2 - Tdap) 03/24/2023   INFLUENZA VACCINE  07/10/2023   MAMMOGRAM  01/08/2024   DEXA SCAN  Completed   HPV VACCINES  Aged Out    Pneumonia Vaccine 42+ Years old  Discontinued   Zoster Vaccines- Shingrix  Discontinued    Last colonoscopy: 2011 DONE MGM: annually at [facility], just had 12/2022  DEXA 2022, Osteoporosis, was on break for bisphosphonates,T-2.6, GYN follows PAP/pelvic: 10/2018 negative, Dr. Audie Box  prevnar 13 has allergy - none further  Names of Other Physician/Practitioners you currently use: 1. [facility] here for primary care 2. Dr Jethro Bolus, eye doctor, last visit 2023 3. Dr Morene Antu, DDS, dentist, last visit 2023 & every 4 months 4. Dr. Doreen Beam, derm, last visit 2023, goes annually, hx of basal cell   Patient Care Team: Lucky Cowboy, MD as PCP - General (Internal Medicine) Pricilla Riffle, MD as PCP - Cardiology (Cardiology) Bernette Redbird, MD as Consulting Physician (Gastroenterology) Jethro Bolus, MD as Consulting Physician (Ophthalmology) Clance, Maree Krabbe, MD as Consulting Physician (Pulmonary Disease) Fontaine, Nadyne Coombes, MD (Inactive) as Consulting Physician (Gynecology) Love, Genene Churn, MD as Consulting Physician (Neurology) Huston Foley,  MD as Attending Physician (Neurology) Aris Lot, MD as Consulting Physician (Dermatology)  Surgical History:  She has a past surgical history that includes Cesarean section (1961); Tonsillectomy and adenoidectomy; Appendectomy (1961); Total abdominal hysterectomy w/ bilateral salpingoophorectomy (1997); and cataract surg (Bilateral, 2021). Family History:  Herfamily history includes Blindness (age of onset: 50) in her sister; Cancer in her father and mother; Dementia in her mother; Diabetes in her sister; Heart disease in her brother, brother, brother, sister, and sister; Hypertension in her sister; Kidney failure in her brother and sister; Prostate cancer in her brother and brother; Skin cancer in her sister. Social History:  She reports that she has never smoked. She has never used smokeless tobacco.  She reports that she does not currently use alcohol. She reports that she does not use drugs.   Review of Systems: Review of Systems  Constitutional:  Negative for malaise/fatigue and weight loss.  HENT:  Negative for hearing loss and tinnitus.   Eyes:  Negative for blurred vision and double vision.  Respiratory:  Negative for cough, sputum production, shortness of breath and wheezing.   Cardiovascular:  Negative for chest pain, palpitations, orthopnea, claudication, leg swelling and PND.  Gastrointestinal:  Negative for abdominal pain, blood in stool, constipation, diarrhea, heartburn, melena, nausea and vomiting.  Genitourinary: Negative.   Musculoskeletal:  Negative for falls, joint pain and myalgias.  Skin:  Negative for rash.  Neurological:  Positive for tremors (Baseline, bilateral hands). Negative for dizziness, tingling, sensory change, weakness and headaches.  Endo/Heme/Allergies:  Negative for polydipsia.  Psychiatric/Behavioral:  Negative for depression, memory loss, substance abuse and suicidal ideas. The patient is nervous/anxious. The patient does not have insomnia.   All other systems reviewed and are negative.   Physical Exam: Estimated body mass index is 23.91 kg/m as calculated from the following:   Height as of this encounter: 5\' 3"  (1.6 m).   Weight as of this encounter: 135 lb (61.2 kg). BP 120/64   Pulse (!) 57   Temp 97.6 F (36.4 C)   Ht 5\' 3"  (1.6 m)   Wt 135 lb (61.2 kg)   SpO2 99%   BMI 23.91 kg/m   General Appearance: Well nourished, in no apparent distress.  Eyes: PERRLA, EOMs, conjunctiva no swelling or erythema Sinuses: No Frontal/maxillary tenderness  ENT/Mouth: Ext aud canal with cerumen. Good dentition. No erythema, swelling, or exudate on post pharynx. Tonsils not swollen or erythematous. Hearing normal.  Neck: Supple, thyroid normal. No bruits  Respiratory: Respiratory effort normal, BS equal bilaterally without rales, rhonchi, wheezing or  stridor.  Cardio: RRR without murmurs, rubs or gallops. Brisk peripheral pulses without edema.  Chest: symmetric, with normal excursions and percussion.  Breasts: Defer to GYN  Abdomen: Soft, nontender, no guarding, rebound, hernias, masses, or organomegaly.  Lymphatics: Non tender without lymphadenopathy.  Genitourinary: Defer to GYN Musculoskeletal: Full ROM all peripheral extremities,5/5 strength, and normal gait. Mild kyphosis.   Skin: Warm, dry without rashes, concerning lesions, ecchymosis. Neuro: Cranial nerves intact, reflexes equal bilaterally. Normal muscle tone, no cerebellar symptoms, subtle tremor of bilateral hands, head tremor, sensation intact bil to monofilament Psych: Awake and oriented X 3, mildly anxious affect, Insight and Judgment appropriate.   Cathye Kreiter 3:38 PM [facility]

## 2023-03-20 LAB — CBC WITH DIFFERENTIAL/PLATELET
Absolute Monocytes: 523 cells/uL (ref 200–950)
Basophils Absolute: 82 cells/uL (ref 0–200)
Basophils Relative: 1.3 %
Eosinophils Absolute: 239 cells/uL (ref 15–500)
Eosinophils Relative: 3.8 %
HCT: 45.3 % — ABNORMAL HIGH (ref 35.0–45.0)
Hemoglobin: 14.9 g/dL (ref 11.7–15.5)
Lymphs Abs: 2363 cells/uL (ref 850–3900)
MCH: 28.8 pg (ref 27.0–33.0)
MCHC: 32.9 g/dL (ref 32.0–36.0)
MCV: 87.5 fL (ref 80.0–100.0)
MPV: 10 fL (ref 7.5–12.5)
Monocytes Relative: 8.3 %
Neutro Abs: 3093 cells/uL (ref 1500–7800)
Neutrophils Relative %: 49.1 %
Platelets: 469 10*3/uL — ABNORMAL HIGH (ref 140–400)
RBC: 5.18 10*6/uL — ABNORMAL HIGH (ref 3.80–5.10)
RDW: 13.9 % (ref 11.0–15.0)
Total Lymphocyte: 37.5 %
WBC: 6.3 10*3/uL (ref 3.8–10.8)

## 2023-03-20 LAB — COMPLETE METABOLIC PANEL WITH GFR
AG Ratio: 1.9 (calc) (ref 1.0–2.5)
ALT: 10 U/L (ref 6–29)
AST: 14 U/L (ref 10–35)
Albumin: 4.1 g/dL (ref 3.6–5.1)
Alkaline phosphatase (APISO): 36 U/L — ABNORMAL LOW (ref 37–153)
BUN/Creatinine Ratio: 25 (calc) — ABNORMAL HIGH (ref 6–22)
BUN: 27 mg/dL — ABNORMAL HIGH (ref 7–25)
CO2: 30 mmol/L (ref 20–32)
Calcium: 9.9 mg/dL (ref 8.6–10.4)
Chloride: 103 mmol/L (ref 98–110)
Creat: 1.09 mg/dL — ABNORMAL HIGH (ref 0.60–0.95)
Globulin: 2.2 g/dL (calc) (ref 1.9–3.7)
Glucose, Bld: 114 mg/dL — ABNORMAL HIGH (ref 65–99)
Potassium: 4.9 mmol/L (ref 3.5–5.3)
Sodium: 139 mmol/L (ref 135–146)
Total Bilirubin: 0.7 mg/dL (ref 0.2–1.2)
Total Protein: 6.3 g/dL (ref 6.1–8.1)
eGFR: 49 mL/min/{1.73_m2} — ABNORMAL LOW (ref >=60)

## 2023-03-20 LAB — VITAMIN D 25 HYDROXY (VIT D DEFICIENCY, FRACTURES): Vit D, 25-Hydroxy: 115 ng/mL — ABNORMAL HIGH (ref 30–100)

## 2023-03-20 LAB — HEMOGLOBIN A1C
Hgb A1c MFr Bld: 5.6 % of total Hgb (ref <5.7)
Mean Plasma Glucose: 114 mg/dL
eAG (mmol/L): 6.3 mmol/L

## 2023-03-20 LAB — LIPID PANEL
Cholesterol: 119 mg/dL (ref <200)
HDL: 44 mg/dL — ABNORMAL LOW (ref >=50)
LDL Cholesterol (Calc): 53 mg/dL (calc)
Non-HDL Cholesterol (Calc): 75 mg/dL (calc) (ref <130)
Total CHOL/HDL Ratio: 2.7 (calc) (ref <5.0)
Triglycerides: 137 mg/dL (ref <150)

## 2023-03-21 NOTE — Patient Instructions (Signed)

## 2023-04-10 ENCOUNTER — Other Ambulatory Visit: Payer: Self-pay | Admitting: Nurse Practitioner

## 2023-04-10 DIAGNOSIS — G250 Essential tremor: Secondary | ICD-10-CM

## 2023-04-10 DIAGNOSIS — I10 Essential (primary) hypertension: Secondary | ICD-10-CM

## 2023-04-28 ENCOUNTER — Other Ambulatory Visit: Payer: Self-pay | Admitting: Nurse Practitioner

## 2023-04-28 DIAGNOSIS — F419 Anxiety disorder, unspecified: Secondary | ICD-10-CM

## 2023-05-20 ENCOUNTER — Other Ambulatory Visit: Payer: Self-pay | Admitting: Internal Medicine

## 2023-05-20 ENCOUNTER — Other Ambulatory Visit: Payer: Self-pay | Admitting: Nurse Practitioner

## 2023-05-20 DIAGNOSIS — E782 Mixed hyperlipidemia: Secondary | ICD-10-CM

## 2023-07-03 ENCOUNTER — Other Ambulatory Visit: Payer: Self-pay | Admitting: Nurse Practitioner

## 2023-07-08 ENCOUNTER — Encounter: Payer: Self-pay | Admitting: Nurse Practitioner

## 2023-07-08 ENCOUNTER — Ambulatory Visit (INDEPENDENT_AMBULATORY_CARE_PROVIDER_SITE_OTHER): Payer: Medicare PPO | Admitting: Nurse Practitioner

## 2023-07-08 VITALS — BP 138/70 | HR 65 | Temp 97.4°F | Ht 63.0 in | Wt 136.2 lb

## 2023-07-08 DIAGNOSIS — M542 Cervicalgia: Secondary | ICD-10-CM

## 2023-07-08 DIAGNOSIS — M2560 Stiffness of unspecified joint, not elsewhere classified: Secondary | ICD-10-CM | POA: Diagnosis not present

## 2023-07-08 NOTE — Progress Notes (Unsigned)
Assessment and Plan:  [facility] was seen today for an episodic visit.  Diagnoses and all order for this visit:  1. Neck pain Continue to alternate ICE/Heat Continue OTC analgesic as directed Remain active, perform ROM exercises If s/s fail to improve discussed referral to Orthopedics for there review and evaluation.   - DG Cervical Spine Complete; Future  2. Limited joint range of motion  - DG Cervical Spine Complete; Future  Notify office for further evaluation and treatment, questions or concerns if s/s fail to improve. The risks and benefits of my recommendations, as well as other treatment options were discussed with the patient today. Questions were answered.  Further disposition pending results of labs. Discussed med's effects and SE's.    Over 15 minutes of exam, counseling, chart review, and critical decision making was performed.   Future Appointments  Date Time Provider Department [facility]  08/07/2023  1:30 PM Butch Penny, NP [facility] None  09/01/2023  2:00 PM Raynelle Dick, NP [facility] None  09/29/2023 11:00 AM Pricilla Riffle, MD [facility]  03/09/2024  4:00 PM Dajiah Kooi, Archie Patten, NP [facility] None    ------------------------------------------------------------------------------------------------------------------   HPI BP 138/70   Pulse 65   Temp (!) 97.4 F (36.3 C)   Ht 5\' 3"  (1.6 m)   Wt 136 lb 3.2 oz (61.8 kg)   SpO2 98%   BMI 24.13 kg/m   89 y.o.female presents for evaluation of neck pain after experiencing an episode of her neck feeling frozen while reading a book x1 week ago.  She has notice this happening in the past but has not had the neck stay stuck in the position for longer than a few seconds.  She has applied heat and taken OTC medication with some relief. Last review of C Spine x-ray 02/2020 did reveal moderate multilevel cervical spondylosis.  Her pain is concentrated on the left lateral side of the neck along the C6 area.   Pain can radiate down to shoulder.  Denies numbness and tingling.   Past Medical History:  Diagnosis Date   C. difficile colitis    Cancer [facility])    Endometrial cancer   Essential and other specified forms of tremor    Osteoporosis 11/2019   T score -2.8 stable from prior DEXA   Personal history of venous thrombosis and embolism 1997   Pulmonary embolism (HCC) 06/14/2021   Pulmonary embolus (HCC)    Shingles    Thrush    TIA (transient ischemic attack)    carotid US 07/2022: no ICA stenosis bilaterally     Allergies  Allergen Reactions   Codeine Hives   Cortisone     Causes jitteriness.   Dexamethasone     anxiety   Hctz [Hydrochlorothiazide] Other (See Comments)    Low sodium   Meloxicam Nausea Only    Gastritis and nausea   Mysoline [Primidone]    Pneumovax 23 [Pneumococcal Vac Polyvalent]     Red, raised area.  Knot at site of injection   Prednisone     Anxiety   Topiramate     Sleepiness, numbness feet tingling in arm.   Toradol [Ketorolac Tromethamine] Swelling    Localized redness at injection site.     Ace Inhibitors Cough    Current Outpatient Medications on File Prior to Visit  Medication Sig   acetaminophen (TYLENOL) 500 MG tablet Take 500 mg by mouth every 6 (six) hours as needed for moderate pain.   atorvastatin (LIPITOR) 10 MG tablet TAKE  1 TABLET BY MOUTH DAILY FOR CHOLESTEROL   baclofen (LIORESAL) 10 MG tablet Take 1 tablet (10 mg total) by mouth 2 (two) times daily. Take 1/2 to 1 tablet 2 x day if needed for muscle spasm   CALCIUM PO Take by mouth.   Cholecalciferol (VITAMIN D3) 5000 units TABS Take 2 tablets by mouth daily.    ELIQUIS 5 MG TABS tablet TAKE 1 TABLET BY MOUTH TWICE DAILY FOR BLOOD CLOT PREVENTION   escitalopram (LEXAPRO) 5 MG tablet TAKE 1 TABLET BY MOUTH DAILY FOR CHRONIC ANXIETY   hydrOXYzine (ATARAX) 25 MG tablet TAKE 1 TABLET(25 MG) BY MOUTH EVERY 6 HOURS AS NEEDED FOR ITCHING   Magnesium 250 MG TABS Take 250 mg by mouth 2 (two)  times a day.   propranolol (INDERAL) 40 MG tablet TAKE 1 TABLET BY MOUTH FOUR TIMES DAILY AT BEDTIME WITH MEALS FOR BLOOD PRESSURE OR TREMORS   vitamin B-12 (CYANOCOBALAMIN) 500 MCG tablet Take 500 mcg by mouth daily. Sublingual   No current facility-administered medications on file prior to visit.    ROS: all negative except what is noted in the HPI.   Physical Exam:  BP 138/70   Pulse 65   Temp (!) 97.4 F (36.3 C)   Ht 5\' 3"  (1.6 m)   Wt 136 lb 3.2 oz (61.8 kg)   SpO2 98%   BMI 24.13 kg/m   General Appearance: NAD.  Awake, conversant and cooperative. Eyes: PERRLA, EOMs intact.  Sclera white.  Conjunctiva without erythema. Sinuses: No frontal/maxillary tenderness.  No nasal discharge. Nares patent.  ENT/Mouth: Ext aud canals clear.  Bilateral TMs w/DOL and without erythema or bulging. Hearing intact.  Posterior pharynx without swelling or exudate.  Tonsils without swelling or erythema.  Neck: LROM d/t pain.  Pain with flexion.  Tender to palpation along left posterior neck at C6 level.  Otherwise supple.  No masses, nodules or thyromegaly. Respiratory: Effort is regular with non-labored breathing. Breath sounds are equal bilaterally without rales, rhonchi, wheezing or stridor.  Cardio: RRR with no MRGs. Brisk peripheral pulses without edema.  Abdomen: Active BS in all four quadrants.  Soft and non-tender without guarding, rebound tenderness, hernias or masses. Lymphatics: Non tender without lymphadenopathy.  Musculoskeletal: Full ROM, 5/5 strength, normal ambulation.  No clubbing or cyanosis. Skin: Appropriate color for ethnicity. Warm without rashes, lesions, ecchymosis, ulcers.  Neuro: CN II-XII grossly normal. Normal muscle tone without cerebellar symptoms and intact sensation.   Psych: AO X 3,  appropriate mood and affect, insight and judgment.     Monica Glimpse, NP 3:27 PM [facility]

## 2023-07-09 ENCOUNTER — Ambulatory Visit
Admission: RE | Admit: 2023-07-09 | Discharge: 2023-07-09 | Disposition: A | Discharge status: Home / Self Care | Payer: Medicare PPO | Source: Ambulatory Visit | Attending: Nurse Practitioner | Admitting: Nurse Practitioner

## 2023-07-09 DIAGNOSIS — M542 Cervicalgia: Secondary | ICD-10-CM

## 2023-07-09 DIAGNOSIS — M4802 Spinal stenosis, cervical region: Secondary | ICD-10-CM | POA: Diagnosis not present

## 2023-07-09 DIAGNOSIS — M2560 Stiffness of unspecified joint, not elsewhere classified: Secondary | ICD-10-CM

## 2023-07-09 DIAGNOSIS — M50322 Other cervical disc degeneration at C5-C6 level: Secondary | ICD-10-CM | POA: Diagnosis not present

## 2023-07-09 NOTE — Patient Instructions (Signed)
Cervical Radiculopathy  Cervical radiculopathy happens when a nerve in the neck (a cervical nerve) is pinched or bruised. This condition can happen because of an injury to the cervical spine (vertebrae) in the neck, or as part of the normal aging process. Pressure on the cervical nerves can cause pain or numbness that travels from the neck all the way down to the arm and fingers. This condition usually gets better with rest. Treatment may be needed if the condition does not improve. What are the causes? This condition may be caused by: A neck injury. A bulging (herniated) disk. Muscle spasms. Muscle tightness in the neck due to overuse. Arthritis. Breakdown or degeneration in the bones and joints of the spine (spondylosis) due to aging. Bone spurs that may develop near the cervical nerves. What are the signs or symptoms? Symptoms of this condition include: Pain. The pain may travel from the neck to the arm and hand. The pain can be severe or irritating. It may get worse when you move your neck. Numbness or tingling in your arm or hand. Weakness in the affected arm and hand, in severe cases. How is this diagnosed? This condition may be diagnosed based on your symptoms, your medical history, and a physical exam. You may also have tests, including: X-rays. CT scan. MRI. Electromyogram (EMG). Nerve conduction tests. How is this treated? In many cases, treatment is not needed for this condition. With rest, the condition usually gets better over time. If treatment is needed, options may include: Wearing a soft neck collar (cervical collar) for short periods of time. Doing physical therapy to strengthen your neck muscles. Taking medicines. These may include NSAIDs, such as ibuprofen, or oral corticosteroids. Having spinal injections, in severe cases. Having surgery. This may be needed if other treatments do not help. Different types of surgery may be done depending on the cause of this  condition. Follow these instructions at home: If you have a cervical collar: Wear it as told by your health care provider. Remove it only as told by your health care provider. Ask your health care provider if you can remove the cervical collar for cleaning and bathing. If you are allowed to remove the collar for cleaning or bathing: Follow instructions from your health care provider about how to remove the collar safely. Clean the collar by wiping it with mild soap and water and drying it completely. Take out any removable pads in the collar every 1-2 days, and wash them by hand with soap and water. Let them air-dry completely before you put them back in the collar. Check your skin under the collar for irritation or sores. If you see any, tell your health care provider. Managing pain     Take over-the-counter and prescription medicines only as told by your health care provider. If directed, put ice on the affected area. To do this: If you have a soft neck collar, remove it as told by your health care provider. Put ice in a plastic bag. Place a towel between your skin and the bag. Leave the ice on for 20 minutes, 2-3 times a day. Remove the ice if your skin turns bright red. This is very important. If you cannot feel pain, heat, or cold, you have a greater risk of damage to the area. If applying ice does not help, you can try using heat. Use the heat source that your health care provider recommends, such as a moist heat pack or a heating pad. Place a towel between   your skin and the heat source. Leave the heat on for 20-30 minutes. Remove the heat if your skin turns bright red. This is especially important if you are unable to feel pain, heat, or cold. You have a greater risk of getting burned. Try a gentle neck and shoulder massage to help relieve symptoms. Activity Rest as needed. Return to your normal activities as told by your health care provider. Ask your health care provider what  activities are safe for you. Do stretching and strengthening exercises as told by your health care provider or your physical therapist. You may have to avoid lifting. Ask your health care provider how much you can safely lift. General instructions Use a flat pillow when you sleep. Do not drive while wearing a cervical collar. If you do not have a cervical collar, ask your health care provider if it is safe to drive while your neck heals. Ask your health care provider if the medicine prescribed to you requires you to avoid driving or using machinery. Do not use any products that contain nicotine or tobacco. These products include cigarettes, chewing tobacco, and vaping devices, such as e-cigarettes. If you need help quitting, ask your health care provider. Keep all follow-up visits. This is important. Contact a health care provider if: Your condition does not improve with treatment. Get help right away if: Your pain gets much worse and is not controlled with medicines. You have weakness or numbness in your hand, arm, face, or leg. You have a high fever. You have a stiff, rigid neck. You lose control of your bowels or your bladder (have incontinence). You have trouble with walking, balance, or speaking. Summary Cervical radiculopathy happens when a nerve in the neck is pinched or bruised. A nerve can get pinched from a bulging disk, arthritis, muscle spasms, or an injury to the neck. Symptoms include pain, tingling, or numbness radiating from the neck to the arm or hand. Weakness can also occur in severe cases. Treatment may include rest, wearing a cervical collar, and physical therapy. Medicines may be prescribed to help with pain. In severe cases, injections or surgery may be needed. This information is not intended to replace advice given to you by your health care provider. Make sure you discuss any questions you have with your health care provider. Document Revised: 05/31/2021 Document  Reviewed: 05/31/2021 Elsevier Patient Education  2024 [employer]  

## 2023-07-14 ENCOUNTER — Encounter: Payer: Self-pay | Admitting: Nurse Practitioner

## 2023-07-17 ENCOUNTER — Encounter: Payer: Self-pay | Admitting: Nurse Practitioner

## 2023-07-29 ENCOUNTER — Encounter: Payer: Self-pay | Admitting: Adult Health

## 2023-07-29 ENCOUNTER — Other Ambulatory Visit: Payer: Self-pay | Admitting: Nurse Practitioner

## 2023-07-29 DIAGNOSIS — F419 Anxiety disorder, unspecified: Secondary | ICD-10-CM

## 2023-08-07 ENCOUNTER — Ambulatory Visit: Payer: Medicare PPO | Admitting: Adult Health

## 2023-08-28 ENCOUNTER — Other Ambulatory Visit: Payer: Self-pay | Admitting: Nurse Practitioner

## 2023-08-28 ENCOUNTER — Telehealth: Payer: Self-pay | Admitting: Nurse Practitioner

## 2023-08-28 MED ORDER — ESCITALOPRAM OXALATE 5 MG PO TABS: 5.0000 mg | ORAL_TABLET | Freq: Every day | ORAL | 0 refills | Status: DC

## 2023-08-28 NOTE — Progress Notes (Signed)
 CPE  Assessment and Plan:  Encounter for Annual Physical Exam with abnormal findings Due annually  Mammogram UTD 12/2022- benign repeat in 1 year DEXA  11/2021 T score - 2.6- Ordered  Atherosclerosis of aorta (HCC) - per CT 06/2021 Control blood pressure, cholesterol, glucose, increase exercise.   Essential hypertension At goal; continue medication - propranolol 40 mg QID Monitor blood pressure at home; call if consistently over 150/0 Continue DASH diet.   Reminder to go to the ER if any CP, SOB, nausea, dizziness, severe HA, changes vision/speech, left arm numbness and tingling and jaw pain.  Complete RBBB Followed by cardiology; monitor   Paroxysmal SVT (HCC) Continue BB; no recurrence Continue to follow with cardiology  History of TIA (transient ischemic attack) Continue eliquis; follow up with neurology Control blood pressure, cholesterol, glucose, increase exercise.   Essential tremor  Continue follow up with neuro, on inderal, off of topamax and tapering off of klonopin;   Osteoporosis, unspecified osteoporosis type, unspecified pathological fracture presence Curently on drug break with hx of bisphosphonate tx; last DEXA showed stable T score; continue follow up with GYN, due this year Continue vitamin D supplementation, recommend weight bearing exercises DEXA ordered at [facility]  History of endometrial cancer S/p hysterectomy; continue follow up with GYN  Urethral prolapse S/p hysterectomy; continue follow up with GYN  Chronic Kidney Disease Stage 3b (HCC) Family history of kidney failure in 2 siblings Bilateral renal cysts Increase fluids, avoid NSAIDS, monitor sugars, will monitor Nephrology is following  Vitamin D deficiency At goal; Continue supplementation  Hx/o DVT/PE Continue eliquis  No bleeding concerns, denies falls   Mixed hyperlipidemia Well controlled; continue statin- Atorvastatin 10 mg Continue low cholesterol diet and exercise.  Check lipid  panel q3-6 months  Other abnormal glucose Discussed disease and risks Discussed diet/exercise, weight management  Check A1C annually  B12 deficiency On supplement (sublingual); check B12 level  Personal history of skin cancer (basal cell) Followed by derm  Anxiety/ Insomnia Difficulty falling asleep- restless legs interrupts sleep Continue lexapro 5 mg daily Hydroxyzine 25 mg as needed for sleep    Discussed med's effects and SE's. Screening labs and tests as requested with regular follow-up as recommended. Over 40 minutes of exam, counseling, chart review, and complex, high level critical decision making was performed this visit.   Future Appointments  Date Time Provider Department Center  09/29/2023 11:00 AM Pricilla Riffle, MD [facility]  03/09/2024  4:00 PM [name], NP [facility] None  09/01/2024  2:00 PM Raynelle Dick, NP [facility] None      HPI  89 y.o. female  presents for CPE and follow up for has History of endometrial cancer; Hx/o DVT/PE; Osteoporosis; Urethral prolapse; Essential hypertension; Hyperlipidemia, mixed; Abnormal glucose; Vitamin D deficiency; Essential tremor; Medication management; Chronic kidney disease, stage 3b (HCC); History of TIA (transient ischemic attack); B12 deficiency; History of skin cancer (basal cell); Complete right bundle branch block; Paroxysmal supraventricular tachycardia; Bilateral renal cysts; Aortic atherosclerosis (HCC) - CT 06/2021; Anxiety; and Thyroid nodule on their problem list.   She is widowed, 1 living child. She has 2 grandchildren.    Hx of DVTs,  bilateral PEs on CTA 07/07/2021, hematology recommended indefinite NOAC unless bleeding contraindication. Continues on Eliquis.   The patient was admitted in 10/2017 for a L Brain TIA w/ transient expressive aphasia, workup unremarkable. She is followed by Neurology. She presented to ED on 07/07/2021 c/o tingling of left upper and lower extremity and  hypertensive emergency.  They found no appreciable deficits on exam, underwent unremarkable CT, MRI, stable/unchanged since 2018 with moderate nonspecific cerebral white matter signal changes.   She has followed with neuro for tremors, Dr. Frances Furbish. She is currently on Propranolol. Paresthesias did resolve. She had normal EEG (08/10/2021) and sleep study planned once off of klonopin. She has issues with restless legs which does make sleep difficult. Does not want to take medication for restless legs.  She is doing different exercises to treat RLS.   Anxiety has been concern, she is currently on Lexapro 5 mg and Buspar 5 mg TID as needed.   She was also followed by Dr. Claretta Fraise (GYN) and Dr. Penni Bombard, goes annually who manages PAPs, MM[facility] (at [facility]), DEXA. Hx of endometrial cancer s/p hysterectomy, urethral prolapse. She is currently on drug break from bisphosphonate for osteoporosis.   BMI is Body mass index is 23.7 kg/m., she has been working on diet, exercise .  Doing more yard work. Washer and dryer are in the basement Wt Readings from Last 3 Encounters:  09/01/23 137 lb (62.1 kg)  07/08/23 136 lb 3.2 oz (61.8 kg)  03/19/23 135 lb (61.2 kg)   Her blood pressure has been controlled at home, currently on Propranolol 40 mg QID ill occasionally leave off bedtime one,today [name] is BP: 118/66  BP Readings from Last 3 Encounters:  09/01/23 118/66  07/08/23 138/70  03/19/23 120/64  She does not workout. She denies chest pain, shortness of breath, dizziness.    Follows with Dr. Tenny Craw after evaluation for episodes of syncope; holter in 2019 showed short burst of SVT, now on propranolol, 2D echo showed LVEF 55-60% with grade 1 DD. Normal myocardial perfusion in 2019. No further episodes. She has appt next month with Dr. Tenny Craw.   She as aortic atherosclerosis per CT 06/2021.   She is on cholesterol medication (atorvastatin 10 mg daily) and denies myalgias. Her cholesterol is at goal. The cholesterol last  visit was:   Lab Results  Component Value Date   CHOL 119 03/19/2023   HDL 44 (L) 03/19/2023   LDLCALC 53 03/19/2023   TRIG 137 03/19/2023   CHOLHDL 2.7 03/19/2023   She has been working on diet and exercise for glucose management, she is on bASA, and denies foot ulcerations, increased appetite, nausea, paresthesia of the feet, polydipsia, polyuria, visual disturbances, vomiting and weight loss. Last A1C in the office was:  Lab Results  Component Value Date   HGBA1C 5.6 03/19/2023   [name], family history of kidney failure in brother and sister 75-80 fluid ounces, daily, no alcohol, no NSAIDs, no hematuria or stone hx US showed simple renal cysts, markedly small L kidney [facility] Dr. Thedore Mins who recommends monitoring only at this time; follows annually Last GFR: Lab Results  Component Value Date   EGFR 49 (L) 03/19/2023    Patient is on Vitamin D supplement   Lab Results  Component Value Date   VD25OH 115 (H) 03/19/2023     She is on supplement, taking 1000 mcg tab sublingual daily.  Lab Results  Component Value Date   VITAMINB12 655 08/28/2022    Current Medications:  Current Outpatient Medications on File Prior to Visit  Medication Sig Dispense Refill   acetaminophen (TYLENOL) 500 MG tablet Take 500 mg by mouth every 6 (six) hours as needed for moderate pain.     atorvastatin (LIPITOR) 10 MG tablet TAKE 1 TABLET BY MOUTH DAILY FOR CHOLESTEROL 90 tablet 3  baclofen (LIORESAL) 10 MG tablet Take 1 tablet (10 mg total) by mouth 2 (two) times daily. Take 1/2 to 1 tablet 2 x day if needed for muscle spasm 60 tablet 1   CALCIUM PO Take by mouth.     Cholecalciferol (VITAMIN D3) 5000 units TABS Take 2 tablets by mouth daily.      ELIQUIS 5 MG TABS tablet TAKE 1 TABLET BY MOUTH TWICE DAILY FOR BLOOD CLOT PREVENTION 180 tablet 3   escitalopram (LEXAPRO) 5 MG tablet TAKE 1 TABLET BY MOUTH DAILY FOR CHRONIC ANXIETY 90 tablet 0   hydrOXYzine (ATARAX) 25 MG tablet  TAKE 1 TABLET(25 MG) BY MOUTH EVERY 6 HOURS AS NEEDED FOR ITCHING 90 tablet 0   Magnesium 250 MG TABS Take 250 mg by mouth 2 (two) times a day.     propranolol (INDERAL) 40 MG tablet TAKE 1 TABLET BY MOUTH FOUR TIMES DAILY AT BEDTIME WITH MEALS FOR BLOOD PRESSURE OR TREMORS 360 tablet 3   vitamin B-12 (CYANOCOBALAMIN) 500 MCG tablet Take 500 mcg by mouth daily. Sublingual     No current facility-administered medications on file prior to visit.   Allergies:  Allergies  Allergen Reactions   Codeine Hives   Cortisone     Causes jitteriness.   Dexamethasone     anxiety   Hctz [Hydrochlorothiazide] Other (See Comments)    Low sodium   Meloxicam Nausea Only    Gastritis and nausea   Mysoline [Primidone]    Pneumovax 23 [Pneumococcal Vac Polyvalent]     Red, raised area.  Knot at site of injection   Prednisone     Anxiety   Topiramate     Sleepiness, numbness feet tingling in arm.   Toradol [Ketorolac Tromethamine] Swelling    Localized redness at injection site.     Ace Inhibitors Cough   Medical History:  She has History of endometrial cancer; Hx/o DVT/PE; Osteoporosis; Urethral prolapse; Essential hypertension; Hyperlipidemia, mixed; Abnormal glucose; Vitamin D deficiency; Essential tremor; Medication management; Chronic kidney disease, stage 3b (HCC); History of TIA (transient ischemic attack); B12 deficiency; History of skin cancer (basal cell); Complete right bundle branch block; Paroxysmal supraventricular tachycardia; Bilateral renal cysts; Aortic atherosclerosis (HCC) - CT 06/2021; Anxiety; and Thyroid nodule on their problem list. Health Maintenance:   Immunization History  Administered Date(s) Administered   H1N1 01/03/2009   Influenza Whole 09/23/2013   Influenza, High Dose Seasonal PF 09/06/2014, 08/23/2015, 08/21/2016, 08/28/2017, 09/01/2018, 09/14/2019, 09/19/2020, 10/02/2021, 10/03/2022, 09/01/2023   PFIZER(Purple Top)SARS-COV-2 Vaccination 12/29/2019, 01/16/2020,  09/06/2020, 05/04/2021   PPD Test 03/29/2014   Pfizer Covid-19 Vaccine Bivalent Booster 50yrs & up 12/19/2022   Pneumococcal Polysaccharide-23 03/29/2014   Pneumococcal-Unspecified 12/09/2002   Td 03/23/2013   Zoster, Live 12/09/2006      Names of Other Physician/Practitioners you currently use: 1. [facility] here for primary care 2. Dr Jethro Bolus, eye doctor, last visit 12/2022- plans to go to Dr. Dione Booze 3. Dr Morene Antu, DDS, dentist, last visit 2024 4. Dr. Doreen Beam, derm, due 12/ 2024  Patient Care Team: Lucky Cowboy, MD as PCP - General (Internal Medicine) Pricilla Riffle, MD as PCP - Cardiology (Cardiology) Bernette Redbird, MD as Consulting Physician (Gastroenterology) Jethro Bolus, MD as Consulting Physician (Ophthalmology) Clance, Maree Krabbe, MD as Consulting Physician (Pulmonary Disease) Fontaine, Nadyne Coombes, MD (Inactive) as Consulting Physician (Gynecology) Love, Genene Churn, MD as Consulting Physician (Neurology) Huston Foley, MD as Attending Physician (Neurology) Aris Lot, MD as Consulting Physician (Dermatology)  Surgical  History:  She has a past surgical history that includes Cesarean section (1961); Tonsillectomy and adenoidectomy; Appendectomy (1961); Total abdominal hysterectomy w/ bilateral salpingoophorectomy (1997); and cataract surg (Bilateral, 2021). Family History:  Herfamily history includes Blindness (age of onset: 56) in her sister; Cancer in her father and mother; Dementia in her mother; Diabetes in her sister; Heart disease in her brother, brother, brother, sister, and sister; Hypertension in her sister; Kidney failure in her brother and sister; Prostate cancer in her brother and brother; Skin cancer in her sister. Social History:  She reports that she has never smoked. She has never used smokeless tobacco. She reports that she does not currently use alcohol. She reports that she does not use drugs.   Review of  Systems: Review of Systems  Constitutional:  Negative for malaise/fatigue and weight loss.  HENT:  Negative for hearing loss and tinnitus.   Eyes:  Negative for blurred vision and double vision.  Respiratory:  Negative for cough, sputum production, shortness of breath and wheezing.   Cardiovascular:  Negative for chest pain, palpitations, orthopnea, claudication, leg swelling and PND.  Gastrointestinal:  Negative for abdominal pain, blood in stool, constipation, diarrhea, heartburn, melena, nausea and vomiting.  Genitourinary: Negative.   Musculoskeletal:  Negative for falls, joint pain and myalgias.  Skin:  Negative for rash.  Neurological:  Positive for tremors (Baseline, bilateral hands). Negative for dizziness, tingling, sensory change, weakness and headaches.  Endo/Heme/Allergies:  Negative for polydipsia.  Psychiatric/Behavioral:  Negative for depression, memory loss, substance abuse and suicidal ideas. The patient is nervous/anxious (controlled with lexapro 5 mg) and has insomnia.        Restless leg  All other systems reviewed and are negative.   Physical Exam: Estimated body mass index is 23.7 kg/m as calculated from the following:   Height as of this encounter: 5' 3.75" (1.619 m).   Weight as of this encounter: 137 lb (62.1 kg). BP 118/66   Pulse 61   Temp 97.9 F (36.6 C)   Ht 5' 3.75" (1.619 m)   Wt 137 lb (62.1 kg)   SpO2 98%   BMI 23.70 kg/m  General Appearance: Well nourished, in no apparent distress.  Eyes: PERRLA, EOMs, conjunctiva no swelling or erythema Sinuses: No Frontal/maxillary tenderness  ENT/Mouth:TM's without bulging or erythema. Good dentition. No erythema, swelling, or exudate on post pharynx. Hearing normal.  Neck: Supple, thyroid normal. No bruits  Respiratory: Respiratory effort normal, BS equal bilaterally without rales, rhonchi, wheezing or stridor.  Cardio: RRR without murmurs, rubs or gallops. Brisk peripheral pulses without edema.  Chest:  symmetric, with normal excursions and percussion.  Breasts: Defer to GYN  Abdomen: Soft, nontender, no guarding, rebound, hernias, masses, or organomegaly.  Lymphatics: Non tender without lymphadenopathy.  Genitourinary: Defer to GYN Musculoskeletal: Full ROM all peripheral extremities,5/5 strength, and normal gait. Mild kyphosis.   Skin: Warm, dry without rashes, concerning lesions, ecchymosis. Neuro: Cranial nerves intact, reflexes equal bilaterally. Normal muscle tone, no cerebellar symptoms,  tremor of bilateral hands, head tremor, sensation intact bil to monofilament Psych: Awake and oriented X 3, normal affect, Insight and Judgment appropriate.   EKG:  cardiology  follows annually, defer  Immanuel Fedak E  2:55 PM [facility]

## 2023-08-28 NOTE — Telephone Encounter (Signed)
Requesting refill on lexapro. Please send to [facility] off [address]

## 2023-08-29 ENCOUNTER — Other Ambulatory Visit: Payer: Self-pay | Admitting: Nurse Practitioner

## 2023-09-01 ENCOUNTER — Encounter: Payer: Self-pay | Admitting: Nurse Practitioner

## 2023-09-01 ENCOUNTER — Ambulatory Visit (INDEPENDENT_AMBULATORY_CARE_PROVIDER_SITE_OTHER): Payer: Medicare PPO | Admitting: Nurse Practitioner

## 2023-09-01 VITALS — BP 118/66 | HR 61 | Temp 97.9°F | Ht 63.75 in | Wt 137.0 lb

## 2023-09-01 DIAGNOSIS — E538 Deficiency of other specified B group vitamins: Secondary | ICD-10-CM

## 2023-09-01 DIAGNOSIS — I10 Essential (primary) hypertension: Secondary | ICD-10-CM

## 2023-09-01 DIAGNOSIS — I700 Atherosclerosis of aorta: Secondary | ICD-10-CM

## 2023-09-01 DIAGNOSIS — Z1329 Encounter for screening for other suspected endocrine disorder: Secondary | ICD-10-CM

## 2023-09-01 DIAGNOSIS — E559 Vitamin D deficiency, unspecified: Secondary | ICD-10-CM | POA: Diagnosis not present

## 2023-09-01 DIAGNOSIS — E782 Mixed hyperlipidemia: Secondary | ICD-10-CM | POA: Diagnosis not present

## 2023-09-01 DIAGNOSIS — Z8542 Personal history of malignant neoplasm of other parts of uterus: Secondary | ICD-10-CM

## 2023-09-01 DIAGNOSIS — R7309 Other abnormal glucose: Secondary | ICD-10-CM | POA: Diagnosis not present

## 2023-09-01 DIAGNOSIS — F419 Anxiety disorder, unspecified: Secondary | ICD-10-CM

## 2023-09-01 DIAGNOSIS — I4510 Unspecified right bundle-branch block: Secondary | ICD-10-CM

## 2023-09-01 DIAGNOSIS — Z85828 Personal history of other malignant neoplasm of skin: Secondary | ICD-10-CM

## 2023-09-01 DIAGNOSIS — Z79899 Other long term (current) drug therapy: Secondary | ICD-10-CM | POA: Diagnosis not present

## 2023-09-01 DIAGNOSIS — Z1389 Encounter for screening for other disorder: Secondary | ICD-10-CM | POA: Diagnosis not present

## 2023-09-01 DIAGNOSIS — I471 Supraventricular tachycardia, unspecified: Secondary | ICD-10-CM

## 2023-09-01 DIAGNOSIS — Z0001 Encounter for general adult medical examination with abnormal findings: Secondary | ICD-10-CM

## 2023-09-01 DIAGNOSIS — Z136 Encounter for screening for cardiovascular disorders: Secondary | ICD-10-CM

## 2023-09-01 DIAGNOSIS — N281 Cyst of kidney, acquired: Secondary | ICD-10-CM

## 2023-09-01 DIAGNOSIS — N1832 Chronic kidney disease, stage 3b: Secondary | ICD-10-CM | POA: Diagnosis not present

## 2023-09-01 DIAGNOSIS — Z0000 Encounter for general adult medical examination without abnormal findings: Secondary | ICD-10-CM

## 2023-09-01 DIAGNOSIS — Z23 Encounter for immunization: Secondary | ICD-10-CM

## 2023-09-01 DIAGNOSIS — Z86718 Personal history of other venous thrombosis and embolism: Secondary | ICD-10-CM

## 2023-09-01 DIAGNOSIS — M810 Age-related osteoporosis without current pathological fracture: Secondary | ICD-10-CM

## 2023-09-01 DIAGNOSIS — N368 Other specified disorders of urethra: Secondary | ICD-10-CM

## 2023-09-01 DIAGNOSIS — G250 Essential tremor: Secondary | ICD-10-CM

## 2023-09-01 NOTE — Patient Instructions (Signed)

## 2023-09-02 LAB — LIPID PANEL
Cholesterol: 122 mg/dL (ref <200)
HDL: 48 mg/dL — ABNORMAL LOW (ref >=50)
LDL Cholesterol (Calc): 53 mg/dL (calc)
Non-HDL Cholesterol (Calc): 74 mg/dL (calc) (ref <130)
Total CHOL/HDL Ratio: 2.5 (calc) (ref <5.0)
Triglycerides: 130 mg/dL (ref <150)

## 2023-09-02 LAB — URINALYSIS, ROUTINE W REFLEX MICROSCOPIC
Bacteria, UA: NONE SEEN /HPF
Bilirubin Urine: NEGATIVE
Glucose, UA: NEGATIVE
Hgb urine dipstick: NEGATIVE
Hyaline Cast: NONE SEEN /LPF
Ketones, ur: NEGATIVE
Nitrite: NEGATIVE
Protein, ur: NEGATIVE
RBC / HPF: NONE SEEN /HPF (ref 0–2)
Specific Gravity, Urine: 1.013 (ref 1.001–1.035)
Squamous Epithelial / HPF: NONE SEEN /HPF (ref <=5)
WBC, UA: NONE SEEN /HPF (ref 0–5)
pH: 5.5 (ref 5.0–8.0)

## 2023-09-02 LAB — MICROALBUMIN / CREATININE URINE RATIO
Creatinine, Urine: 99 mg/dL (ref 20–275)
Microalb Creat Ratio: 9 mg/g creat (ref <30)
Microalb, Ur: 0.9 mg/dL

## 2023-09-02 LAB — CBC WITH DIFFERENTIAL/PLATELET
Absolute Monocytes: 497 cells/uL (ref 200–950)
Basophils Absolute: 121 cells/uL (ref 0–200)
Basophils Relative: 1.7 %
Eosinophils Absolute: 263 cells/uL (ref 15–500)
Eosinophils Relative: 3.7 %
HCT: 48.5 % — ABNORMAL HIGH (ref 35.0–45.0)
Hemoglobin: 15.5 g/dL (ref 11.7–15.5)
Lymphs Abs: 2471 cells/uL (ref 850–3900)
MCH: 28.4 pg (ref 27.0–33.0)
MCHC: 32 g/dL (ref 32.0–36.0)
MCV: 88.8 fL (ref 80.0–100.0)
MPV: 9.8 fL (ref 7.5–12.5)
Monocytes Relative: 7 %
Neutro Abs: 3749 cells/uL (ref 1500–7800)
Neutrophils Relative %: 52.8 %
Platelets: 492 10*3/uL — ABNORMAL HIGH (ref 140–400)
RBC: 5.46 10*6/uL — ABNORMAL HIGH (ref 3.80–5.10)
RDW: 13.9 % (ref 11.0–15.0)
Total Lymphocyte: 34.8 %
WBC: 7.1 10*3/uL (ref 3.8–10.8)

## 2023-09-02 LAB — VITAMIN D 25 HYDROXY (VIT D DEFICIENCY, FRACTURES): Vit D, 25-Hydroxy: 110 ng/mL — ABNORMAL HIGH (ref 30–100)

## 2023-09-02 LAB — MAGNESIUM: Magnesium: 2 mg/dL (ref 1.5–2.5)

## 2023-09-02 LAB — COMPLETE METABOLIC PANEL WITH GFR
AG Ratio: 1.8 (calc) (ref 1.0–2.5)
ALT: 11 U/L (ref 6–29)
AST: 17 U/L (ref 10–35)
Albumin: 4.2 g/dL (ref 3.6–5.1)
Alkaline phosphatase (APISO): 39 U/L (ref 37–153)
BUN/Creatinine Ratio: 17 (calc) (ref 6–22)
BUN: 21 mg/dL (ref 7–25)
CO2: 28 mmol/L (ref 20–32)
Calcium: 9.5 mg/dL (ref 8.6–10.4)
Chloride: 102 mmol/L (ref 98–110)
Creat: 1.27 mg/dL — ABNORMAL HIGH (ref 0.60–0.95)
Globulin: 2.3 g/dL (calc) (ref 1.9–3.7)
Glucose, Bld: 67 mg/dL (ref 65–99)
Potassium: 5.1 mmol/L (ref 3.5–5.3)
Sodium: 139 mmol/L (ref 135–146)
Total Bilirubin: 0.7 mg/dL (ref 0.2–1.2)
Total Protein: 6.5 g/dL (ref 6.1–8.1)
eGFR: 40 mL/min/{1.73_m2} — ABNORMAL LOW (ref >=60)

## 2023-09-02 LAB — TSH: TSH: 1.61 mIU/L (ref 0.40–4.50)

## 2023-09-02 LAB — HEMOGLOBIN A1C W/OUT EAG: Hgb A1c MFr Bld: 5.7 % of total Hgb — ABNORMAL HIGH (ref <5.7)

## 2023-09-26 NOTE — Progress Notes (Unsigned)
+   Cardiology Office Note   Date:  09/29/2023   ID:  Monica Curry, DOB 08-26-1931, MRN [id number]  PCP:  Lucky Cowboy, MD  Cardiologist:   Dietrich Pates, MD   F/U of HTN     History of Present Illness: Monica Curry is a 89 y.o. female with a history of a TIA, HTN,  SVT (found on event monitor; placed on diltiazem) CKD stage III, hx of pulmonary embolism, DVT, essential tremors and presyncope  In May 2022 admitted for another PE      I saw the pt in Sept 2023    She denies dizziness  No SOB   Last week she had mild pain L side of chest   Pleuritic     Had been working in yard the day prior  Otherwise feeling good   Current Meds  Medication Sig   acetaminophen (TYLENOL) 500 MG tablet Take 500 mg by mouth every 6 (six) hours as needed for moderate pain.   atorvastatin (LIPITOR) 10 MG tablet TAKE 1 TABLET BY MOUTH DAILY FOR CHOLESTEROL   baclofen (LIORESAL) 10 MG tablet Take 1 tablet (10 mg total) by mouth 2 (two) times daily. Take 1/2 to 1 tablet 2 x day if needed for muscle spasm   CALCIUM PO Take by mouth.   Cholecalciferol (VITAMIN D3) 5000 units TABS Take 2 tablets by mouth daily.    ELIQUIS 5 MG TABS tablet TAKE 1 TABLET BY MOUTH TWICE DAILY FOR BLOOD CLOT PREVENTION   escitalopram (LEXAPRO) 5 MG tablet TAKE 1 TABLET BY MOUTH DAILY FOR CHRONIC ANXIETY   hydrOXYzine (ATARAX) 25 MG tablet TAKE 1 TABLET(25 MG) BY MOUTH EVERY 6 HOURS AS NEEDED FOR ITCHING   Magnesium 250 MG TABS Take 250 mg by mouth 2 (two) times a day.   propranolol (INDERAL) 40 MG tablet TAKE 1 TABLET BY MOUTH FOUR TIMES DAILY AT BEDTIME WITH MEALS FOR BLOOD PRESSURE OR TREMORS   vitamin B-12 (CYANOCOBALAMIN) 500 MCG tablet Take 500 mcg by mouth daily. Sublingual     Allergies:   Codeine, Cortisone, Dexamethasone, Hctz [hydrochlorothiazide], Meloxicam, Mysoline [primidone], Pneumovax 23 [pneumococcal vac polyvalent], Prednisone, Topiramate, Toradol [ketorolac tromethamine], and Ace inhibitors   Past  Medical History:  Diagnosis Date   C. difficile colitis    Cancer (HCC)    Endometrial cancer   Essential and other specified forms of tremor    Osteoporosis 11/2019   T score -2.8 stable from prior DEXA   Personal history of venous thrombosis and embolism 1997   Pulmonary embolism (HCC) 06/14/2021   Pulmonary embolus (HCC)    Shingles    Thrush    TIA (transient ischemic attack)    carotid US 07/2022: no ICA stenosis bilaterally    Past Surgical History:  Procedure Laterality Date   APPENDECTOMY  1961   cataract surg Bilateral 2021   Dr. Nile Riggs, R eye 8/18, L 8/25   CESAREAN SECTION  1961   TONSILLECTOMY AND ADENOIDECTOMY     TOTAL ABDOMINAL HYSTERECTOMY W/ BILATERAL SALPINGOOPHORECTOMY  1997     Social History:  The patient  reports that she has never smoked. She has never used smokeless tobacco. She reports that she does not currently use alcohol. She reports that she does not use drugs.   Family History:  The patient's family history includes Blindness (age of onset: 64) in her sister; Cancer in her father and mother; Dementia in her mother; Diabetes in her sister; Heart disease in her brother, brother, brother,  sister, and sister; Hypertension in her sister; Kidney failure in her brother and sister; Prostate cancer in her brother and brother; Skin cancer in her sister.    ROS:  Please see the history of present illness. All other systems are reviewed and  Negative to the above problem except as noted.    PHYSICAL EXAM: VS:  BP 114/76   Pulse (!) 56   Ht 5' 3.5" (1.613 m)   Wt 137 lb (62.1 kg)   SpO2 97%   BMI 23.89 kg/m   GEN: Well nourished, well developed in no acute distress  HEENT: normal  Neck: no JVD, no bruit Cardiac: RRR; no murmurs  No LE edema  Respiratory:  clear to auscultation GI: soft, nontender  No hepatomegaly   EKG:  EKG is ordered today.  SB  56 bpm  First degree AV block  PR 224 msec   RBBB  Lipid Panel    Component Value Date/Time    CHOL 122 09/01/2023 1428   TRIG 130 09/01/2023 1428   HDL 48 (L) 09/01/2023 1428   CHOLHDL 2.5 09/01/2023 1428   VLDL 24 10/24/2017 0629   LDLCALC 53 09/01/2023 1428      Wt Readings from Last 3 Encounters:  09/29/23 137 lb (62.1 kg)  09/01/23 137 lb (62.1 kg)  07/08/23 136 lb 3.2 oz (61.8 kg)    Additional studies/ records that were reviewed today include:    Carotid USN  07/2022  Right Carotid: There is no evidence of stenosis in the right ICA. The                 extracranial vessels were near-normal with only minimal  wall thickening or plaque.   Left Carotid: There is no evidence of stenosis in the left ICA. The  extracranial vessels were near-normal with only minimal wall thickening  or plaque.   Vertebrals:  Bilateral vertebral arteries demonstrate antegrade flow.  Subclavians: Normal flow hemodynamics were seen in bilateral subclavian               arteries.    Stress test 04/2018 Nuclear stress EF: 76%. There was no ST segment deviation noted during stress. No T wave inversion was noted during stress. The study is normal. This is a low risk study. The left ventricular ejection fraction is hyperdynamic (>65%).   Low risk stress nuclear study with normal perfusion and normal left ventricular regional and global systolic function.   Monitor 01/2018 NSR with sinus bradycardia 2. Atrial tachycardia at 160 is present 3. Rare PVC's 4. Noise artifact   Echo 10/2017 Study Conclusions   - Left ventricle: The cavity size was normal. Systolic function was   normal. The estimated ejection fraction was in the range of 55%   to 60%. Wall motion was normal; there were no regional wall   motion abnormalities. Doppler parameters are consistent with   abnormal left ventricular relaxation (grade 1 diastolic   dysfunction). - Mitral valve: There was mild regurgitation. - Left atrium: The atrium was moderately dilated.   Impressions:   - No cardiac source of emboli was  indentified.    ASSESSMENT AND PLAN:  1   HTN  BP is controlled   2  HL   Keep on lipitor  Recent labs LDL in June was 43, HDL 53  3   Hx SVT  Denies palpitations    4  Dizziness  Balance is OK     5   CV dz  Keep on ASA and lipitor    6  Hx of PE  Continue Eliquis  Hgb normal     F/U in 9 months   Current medicines are reviewed at length with the patient today.  The patient does not have concerns regarding medicines.  Signed, Dietrich Pates, MD  09/29/2023 11:26 AM    [facility] [address] [place], [place], [place]  [address] Phone: [phone]; Fax: [phone]

## 2023-09-29 ENCOUNTER — Ambulatory Visit: Payer: Medicare PPO | Attending: Internal Medicine | Admitting: Internal Medicine

## 2023-09-29 ENCOUNTER — Encounter: Payer: Self-pay | Admitting: Internal Medicine

## 2023-09-29 VITALS — BP 114/76 | HR 56 | Ht 63.5 in | Wt 137.0 lb

## 2023-09-29 DIAGNOSIS — I10 Essential (primary) hypertension: Secondary | ICD-10-CM

## 2023-09-29 NOTE — Patient Instructions (Signed)
Medication Instructions:   *If you need a refill on your cardiac medications before your next appointment, please call your pharmacy*   Lab Work:  If you have labs (blood work) drawn today and your tests are completely normal, you will receive your results only by: MyChart Message (if you have MyChart) OR A paper copy in the mail If you have any lab test that is abnormal or we need to change your treatment, we will call you to review the results.   Testing/Procedures:   Follow-Up: At [facility], you and your health needs are our priority.  As part of our continuing mission to provide you with exceptional heart care, we have created designated Provider Care Teams.  These Care Teams include your primary Cardiologist (physician) and Advanced Practice Providers (APPs -  Physician Assistants and Nurse Practitioners) who all work together to provide you with the care you need, when you need it.  We recommend signing up for the patient portal called "MyChart".  Sign up information is provided on this After Visit Summary.  MyChart is used to connect with patients for Virtual Visits (Telemedicine).  Patients are able to view lab/test results, encounter notes, upcoming appointments, etc.  Non-urgent messages can be sent to your provider as well.   To learn more about what you can do with MyChart, go to [email or web address].    Your next appointment: AUG 2025

## 2023-10-29 ENCOUNTER — Other Ambulatory Visit: Payer: Self-pay | Admitting: Nurse Practitioner

## 2023-10-29 DIAGNOSIS — F419 Anxiety disorder, unspecified: Secondary | ICD-10-CM

## 2023-12-04 ENCOUNTER — Other Ambulatory Visit: Payer: Self-pay | Admitting: Nurse Practitioner

## 2023-12-05 MED ORDER — ESCITALOPRAM OXALATE 5 MG PO TABS: 5.0000 mg | ORAL_TABLET | Freq: Every day | ORAL | 2 refills | Status: DC

## 2023-12-08 DIAGNOSIS — L814 Other melanin hyperpigmentation: Secondary | ICD-10-CM | POA: Diagnosis not present

## 2023-12-08 DIAGNOSIS — Z85828 Personal history of other malignant neoplasm of skin: Secondary | ICD-10-CM | POA: Diagnosis not present

## 2023-12-08 DIAGNOSIS — D2261 Melanocytic nevi of right upper limb, including shoulder: Secondary | ICD-10-CM | POA: Diagnosis not present

## 2023-12-08 DIAGNOSIS — D224 Melanocytic nevi of scalp and neck: Secondary | ICD-10-CM | POA: Diagnosis not present

## 2023-12-08 DIAGNOSIS — L821 Other seborrheic keratosis: Secondary | ICD-10-CM | POA: Diagnosis not present

## 2023-12-08 DIAGNOSIS — D225 Melanocytic nevi of trunk: Secondary | ICD-10-CM | POA: Diagnosis not present

## 2024-01-09 DIAGNOSIS — Z1231 Encounter for screening mammogram for malignant neoplasm of breast: Secondary | ICD-10-CM | POA: Diagnosis not present

## 2024-01-09 DIAGNOSIS — E2839 Other primary ovarian failure: Secondary | ICD-10-CM | POA: Diagnosis not present

## 2024-01-09 DIAGNOSIS — M8588 Other specified disorders of bone density and structure, other site: Secondary | ICD-10-CM | POA: Diagnosis not present

## 2024-01-09 LAB — HM DEXA SCAN

## 2024-01-22 ENCOUNTER — Telehealth: Payer: Self-pay | Admitting: Internal Medicine

## 2024-01-22 NOTE — Telephone Encounter (Unsigned)
Copied from CRM [id number]. Topic: Appointments - Scheduling Inquiry for Clinic >> Jan 21, 2024  3:38 PM Antony Haste wrote: Reason for CRM: Mahsa is wanting to establish her PCP as Dr. Lenord Fellers since her previous PCP Lucky Cowboy MD has passed away recently. She states her close friend, Jearld Adjutant is a current patient of Dr. Beryle Quant and has recommended her to inquire about this. I advised that Dr. Lenord Fellers is not currently seeing any new patients and offered to schedule her at another office, however she is adamant about receiving an update from Dr. Lenord Fellers.  Callback #:[phone]

## 2024-01-23 NOTE — Telephone Encounter (Signed)
Called patient to let her know that Dr Lenord Fellers can not accept her as a new patient. She verbalized understanding.

## 2024-03-09 ENCOUNTER — Ambulatory Visit: Payer: Medicare PPO | Admitting: Nurse Practitioner

## 2024-04-01 ENCOUNTER — Ambulatory Visit: Payer: Medicare PPO | Admitting: Family Medicine

## 2024-04-01 ENCOUNTER — Encounter: Payer: Self-pay | Admitting: Family Medicine

## 2024-04-01 VITALS — BP 133/68 | HR 56 | Temp 97.7°F | Resp 16 | Ht 63.5 in | Wt 140.0 lb

## 2024-04-01 DIAGNOSIS — Z8673 Personal history of transient ischemic attack (TIA), and cerebral infarction without residual deficits: Secondary | ICD-10-CM | POA: Diagnosis not present

## 2024-04-01 DIAGNOSIS — F411 Generalized anxiety disorder: Secondary | ICD-10-CM

## 2024-04-01 DIAGNOSIS — Z86718 Personal history of other venous thrombosis and embolism: Secondary | ICD-10-CM

## 2024-04-01 DIAGNOSIS — E782 Mixed hyperlipidemia: Secondary | ICD-10-CM

## 2024-04-01 DIAGNOSIS — G250 Essential tremor: Secondary | ICD-10-CM

## 2024-04-01 DIAGNOSIS — R600 Localized edema: Secondary | ICD-10-CM

## 2024-04-01 DIAGNOSIS — I10 Essential (primary) hypertension: Secondary | ICD-10-CM

## 2024-04-01 MED ORDER — HYDROXYZINE HCL 25 MG PO TABS: 25.0000 mg | ORAL_TABLET | Freq: Every day | ORAL | 1 refills | Status: DC

## 2024-04-01 MED ORDER — APIXABAN 5 MG PO TABS
5.0000 mg | ORAL_TABLET | Freq: Two times a day (BID) | ORAL | 3 refills | Status: AC
Start: 2024-04-01 — End: ?

## 2024-04-01 MED ORDER — ATORVASTATIN CALCIUM 10 MG PO TABS
ORAL_TABLET | ORAL | 3 refills | Status: AC
Start: 2024-04-01 — End: ?

## 2024-04-01 MED ORDER — PROPRANOLOL HCL 40 MG PO TABS: 40.0000 mg | ORAL_TABLET | Freq: Four times a day (QID) | ORAL | 3 refills | Status: DC

## 2024-04-01 MED ORDER — ESCITALOPRAM OXALATE 5 MG PO TABS
5.0000 mg | ORAL_TABLET | Freq: Every day | ORAL | 1 refills | Status: DC
Start: 2024-04-01 — End: 2024-10-04

## 2024-04-01 NOTE — Patient Instructions (Signed)
 Welcome to [facility]! It was a pleasure meeting you today.  As discussed, Please schedule a 6 month follow up visit today.  Wear the compression stockings, elevate the leg few times/day  PLEASE NOTE:  If you had any LAB tests please let us  know if you have not heard back within a few days. You may see your results on MyChart before we have a chance to review them but we will give you a call once they are reviewed by us . If we ordered any REFERRALS today, please let us  know if you have not heard from their office within the next week.  Let us  know through MyChart if you are needing REFILLS, or have your pharmacy send us  the request. You can also use MyChart to communicate with me or any office staff.  Please try these tips to maintain a healthy lifestyle:  Eat most of your calories during the day when you are active. Eliminate processed foods including packaged sweets (pies, cakes, cookies), reduce intake of potatoes, white bread, white pasta, and white rice. Look for whole grain options, oat flour or almond flour.  Each meal should contain half fruits/vegetables, one quarter protein, and one quarter carbs (no bigger than a computer mouse).  Cut down on sweet beverages. This includes juice, soda, and sweet tea. Also watch fruit intake, though this is a healthier sweet option, it still contains natural sugar! Limit to 3 servings daily.  Drink at least 1 glass of water with each meal and aim for at least 8 glasses per day  Exercise at least 150 minutes every week.

## 2024-04-01 NOTE — Progress Notes (Signed)
 New Patient Office Visit  Subjective:  Patient ID: Monica Curry, female    DOB: [birth year]  Age: 89 y.o. MRN: [id number]  CC:  Chief Complaint  Patient presents with   Establish Care    Initial visit to establish care with new pcp Swelling in left ankle that started a few weeks ago Not fasting     HPI Lakeya Mulka presents for new pt former Dr. Cassondra Cliff Discussed the use of AI scribe software for clinical note transcription with the patient, who gave verbal consent to proceed.  History of Present Illness Monica Curry is a 89 year old female with essential tremor and hypertension who presents with left foot swelling.  She has experienced swelling in her left foot for approximately two months, which began after wearing a compression ankle sock. The sock was discontinued due to a rash, but the swelling persisted. It is more pronounced by the end of the day and reduces somewhat overnight. There is no pain associated with the swelling. She has a history of blood clots and is currently on Eliquis .  Her essential tremor affects both hands, making writing and eating difficult. Various treatments, including weighted utensils and different medications, have been tried without success. She currently takes propranolol  four times a day for tremor control and blood pressure management. Further treatment options have not been pursued due to age-related concerns and insurance coverage issues.  Her blood pressure was noted to be high about ten days ago, with readings around 170/90, attributed to a temporary lapse in her propranolol  schedule. After resuming her regular schedule and monitoring her salt intake, her blood pressure returned to normal levels, with recent readings of 117/68. No associated symptoms such as headaches, chest pain, or shortness of breath were noted during the period of elevated blood pressure.  She has a history of blood clots in both lungs, for which she is on Eliquis  for  life She has experienced two episodes of blood clots, with the last one being a mystery as to its cause. A CT scan confirmed the presence of clots in both lungs. A test to determine her propensity for blood clots was inconclusive due to her being on Eliquis .  Bro also has h/o clots.  No dark stools, no excessive bleeding  She has a history of anxiety and was previously on Klonopin , which was discontinued due to age-related concerns. Withdrawal symptoms included panic attacks, particularly at night. She is currently on Lexapro  and hydroxyzine  for anxiety management. The Lexapro  was difficult to adjust to, and she prefers not to increase the dose. No thoughts of suicide are present. Takes hydroxyzine  q am  She has a history of a transient ischemic attack (TIA) and is on atorvastatin  to manage her cholesterol levels. She also has a history of anemia and endometrial cancer, which was treated with a hysterectomy without the need for further treatment.  In terms of social history, she lives alone and has one surviving child and two grandchildren. She does not smoke and rarely drinks alcohol . She remains active, doing her own cooking, cleaning, and some yard work, although she notes a decrease in strength.  Sees neph for CKD 3a-last seen in March.      Current Outpatient Medications:    acetaminophen  (TYLENOL ) 500 MG tablet, Take 500 mg by mouth every 6 (six) hours as needed for moderate pain., Disp: , Rfl:    Calcium  Carb-Cholecalciferol (CALCIUM  PLUS VITAMIN D  PO), Take 1 tablet by mouth daily. 1 chewable daily, Disp: ,  Rfl:    Cyanocobalamin  500 MCG CHEW, Chew 1 tablet by mouth daily., Disp: , Rfl:    Magnesium  250 MG TABS, Take 250 mg by mouth 2 (two) times a day., Disp: , Rfl:    VITAMIN D  PO, Take 5,000 Units by mouth daily. 2 tablets daily, Disp: , Rfl:    apixaban  (ELIQUIS ) 5 MG TABS tablet, Take 1 tablet (5 mg total) by mouth 2 (two) times daily., Disp: 180 tablet, Rfl: 3   atorvastatin   (LIPITOR) 10 MG tablet, TAKE 1 TABLET BY MOUTH DAILY FOR CHOLESTEROL, Disp: 90 tablet, Rfl: 3   escitalopram  (LEXAPRO ) 5 MG tablet, Take 1 tablet (5 mg total) by mouth daily., Disp: 90 tablet, Rfl: 1   hydrOXYzine  (ATARAX ) 25 MG tablet, Take 1 tablet (25 mg total) by mouth daily., Disp: 90 tablet, Rfl: 1   propranolol  (INDERAL ) 40 MG tablet, Take 1 tablet (40 mg total) by mouth 4 (four) times daily. TAKE 1 TABLET BY MOUTH FOUR TIMES DAILY AT BEDTIME WITH MEALS FOR BLOOD PRESSURE OR TREMORS, Disp: 360 tablet, Rfl: 3  Past Medical History:  Diagnosis Date   Allergy    Medications and pollen   Anemia    Anxiety    Always   C. difficile colitis    Cancer (HCC)    Endometrial cancer   Cataract    Had surgery 2021   Chronic kidney disease 2018   2 siblings had kidney failure, sister had transplant   Clotting disorder (HCC)    Essential and other specified forms of tremor    Hyperlipidemia    Hypertension    Osteoporosis 11/2019   T score -2.8 stable from prior DEXA   Personal history of venous thrombosis and embolism 1997   Pulmonary embolism (HCC) 06/14/2021   Pulmonary embolus (HCC)    Shingles    Stroke (HCC) 2018   TIA   Thrush    TIA (transient ischemic attack)    carotid US  07/2022: no ICA stenosis bilaterally    Past Surgical History:  Procedure Laterality Date   ABDOMINAL HYSTERECTOMY  1997   APPENDECTOMY  1961   cataract surg Bilateral 2021   Dr. Gennie Kicks, R eye 8/18, L 8/25   CESAREAN SECTION  1961   EYE SURGERY  2021   Cataract both eyes   TONSILLECTOMY AND ADENOIDECTOMY  1944   TOTAL ABDOMINAL HYSTERECTOMY W/ BILATERAL SALPINGOOPHORECTOMY  1997    Family History  Problem Relation Age of Onset   Hypertension Mother    Cancer Mother        Unknown type   Dementia Mother    Varicose Veins Mother    Cancer Father        Bone   Heart disease Sister    Blindness Sister 62       temporal arteritis   Skin cancer Sister        basal and melanoma   Heart  disease Sister    Kidney failure Sister        Kidney transplant   Diabetes Sister    Hypertension Sister    Kidney disease Sister    Vision loss Sister    Varicose Veins Sister    Heart disease Brother    Prostate cancer Brother        Prostate   Heart disease Brother    Kidney failure Brother    Prostate cancer Brother        Prostate   Heart disease Brother  Cancer Brother    Kidney disease Brother    Vision loss Brother    Tremor Neg Hx    Parkinson's disease Neg Hx     Social History   Socioeconomic History   Marital status: Widowed    Spouse name: Not on file   Number of children: 2   Years of education: Not on file   Highest education level: Bachelor's degree (e.g., BA, AB, BS)  Occupational History   Not on file  Tobacco Use   Smoking status: Never   Smokeless tobacco: Never  Vaping Use   Vaping status: Never Used  Substance and Sexual Activity   Alcohol  use: Not Currently   Drug use: Not Currently    Types: Benzodiazepines    Comment: prescribed   Sexual activity: Not Currently    Birth control/protection: Post-menopausal, Surgical    Comment: HYST-1st intercourse 89 yo-Fewer than 5 partners  Other Topics Concern   Not on file  Social History Narrative   Right handed   Caffeine: none   Lives alone   1st dau dec 3 days old   2 grands   Dau in [place]   Social Drivers of Health   Financial Resource Strain: Low Risk  (03/28/2024)   Overall Financial Resource Strain (CARDIA)    Difficulty of Paying Living Expenses: Not hard at all  Food Insecurity: No Food Insecurity (03/28/2024)   Hunger Vital Sign    Worried About Running Out of Food in the Last Year: Never true    Ran Out of Food in the Last Year: Never true  Transportation Needs: No Transportation Needs (03/28/2024)   PRAPARE - Administrator, Civil Service (Medical): No    Lack of Transportation (Non-Medical): No  Physical Activity: Unknown (03/28/2024)   Exercise Vital Sign    Days  of Exercise per Week: 0 days    Minutes of Exercise per Session: Not on file  Stress: Stress Concern Present (03/28/2024)   [employer] of Occupational Health - Occupational Stress Questionnaire    Feeling of Stress : To some extent  Social Connections: Moderately Integrated (03/28/2024)   Social Connection and Isolation Panel [NHANES]    Frequency of Communication with Friends and Family: More than three times a week    Frequency of Social Gatherings with Friends and Family: Once a week    Attends Religious Services: More than 4 times per year    Active Member of [employer] or Organizations: Yes    Attends Banker Meetings: More than 4 times per year    Marital Status: Widowed  Catering manager Violence: Not on file    ROS  ROS: Gen: no fever, chills  Skin: no rash, itching ENT: no ear pain, ear drainage, nasal congestion, rhinorrhea, sinus pressure, sore throat Eyes: no blurry vision, double vision Resp: no cough, wheeze, CV: no CP, palpitations, ,  GI: no heartburn, n/v/d/c, abd pain GU: no dysuria, urgency, frequency, hematuria MSK: no joint pain, myalgias, back pain Neuro: no dizziness, headache, weakness, vertigo Psych: no , SI   Objective:   Today's Vitals: BP 133/68   Pulse (!) 56   Temp 97.7 F (36.5 C) (Temporal)   Resp 16   Ht 5' 3.5" (1.613 m)   Wt 140 lb (63.5 kg)   SpO2 96%   BMI 24.41 kg/m   Physical Exam  Gen: WDWN NAD HEENT: NCAT, conjunctiva not injected, sclera nonicteric NECK:  supple, no thyromegaly, no nodes, no carotid bruits CARDIAC:  RRR, S1S2+, no murmur. DP 2+B LUNGS: CTAB. No wheezes ABDOMEN:  BS+, soft, NTND, No HSM, no masses EXT: + LLE edema-lower 1/3-esp foot MSK: no gross abnormalities. No calf tenderness NEURO: A&O x3.  CN II-XII intact.  Some tremors hands/voice PSYCH: normal mood. Good eye contact   Reviewed labs  Assessment & Plan:  Essential hypertension -     Propranolol  HCl; Take 1 tablet (40 mg total) by  mouth 4 (four) times daily. TAKE 1 TABLET BY MOUTH FOUR TIMES DAILY AT BEDTIME WITH MEALS FOR BLOOD PRESSURE OR TREMORS  Dispense: 360 tablet; Refill: 3  Essential tremor -     Propranolol  HCl; Take 1 tablet (40 mg total) by mouth 4 (four) times daily. TAKE 1 TABLET BY MOUTH FOUR TIMES DAILY AT BEDTIME WITH MEALS FOR BLOOD PRESSURE OR TREMORS  Dispense: 360 tablet; Refill: 3  History of TIA (transient ischemic attack)  Hyperlipidemia, mixed -     Atorvastatin  Calcium ; TAKE 1 TABLET BY MOUTH DAILY FOR CHOLESTEROL  Dispense: 90 tablet; Refill: 3  Hx/o DVT/PE -     Apixaban ; Take 1 tablet (5 mg total) by mouth 2 (two) times daily.  Dispense: 180 tablet; Refill: 3  GAD (generalized anxiety disorder) -     Escitalopram  Oxalate; Take 1 tablet (5 mg total) by mouth daily.  Dispense: 90 tablet; Refill: 1 -     hydrOXYzine  HCl; Take 1 tablet (25 mg total) by mouth daily.  Dispense: 90 tablet; Refill: 1  Assessment and Plan Assessment & Plan   Her hypertension is well-controlled with propranolol , despite a recent episode of elevated blood pressure due to missed doses. Blood pressure has stabilized, and she will continue her current regimen as it is effective and well-tolerated.   Anxiety is managed with Lexapro  and hydroxyzine , with clonazepam  discontinued due to age-related concerns. The current regimen is effective without significant side effects, and she prefers not to increase the Lexapro  dosage. Refills for Lexapro  and hydroxyzine  were provided.   Essential tremor is managed with propranolol , and despite trying various treatments and devices, she finds the current regimen acceptable. She prefers not to switch to long-acting propranolol . Restless legs syndrome is noted.   She has experienced left foot swelling for two months, likely due to venous insufficiency, with no pain or signs of acute thrombosis. Previous use of ankle high compression socks caused a rash. The swelling is likely due to  age-related changes in veins. She is advised to use knee-high compression stockings on both legs, elevate her legs for 15 minutes after lunch, and consider an ultrasound if symptoms persist or worsen.  Doubt dvt as on tx and more foot.   Endometrial cancer was treated with hysterectomy, and there are no current issues.   H/o Pulmonary embolism is managed with Eliquis , with no new embolic events reported. Lifelong anticoagulation was discussed due to recurrent embolic events and inconclusive testing for clotting disorders. A refill for Eliquis  was provided.    Follow-up: Return in about 6 months (around 10/01/2024) for chronic follow-up.   Ellsworth Haas, MD

## 2024-04-27 ENCOUNTER — Inpatient Hospital Stay (HOSPITAL_COMMUNITY)

## 2024-04-27 ENCOUNTER — Encounter (HOSPITAL_COMMUNITY): Payer: Self-pay

## 2024-04-27 ENCOUNTER — Emergency Department (HOSPITAL_COMMUNITY)

## 2024-04-27 ENCOUNTER — Inpatient Hospital Stay (HOSPITAL_COMMUNITY)
Admission: EM | Admit: 2024-04-27 | Discharge: 2024-05-04 | DRG: 481 | Disposition: A | Discharge status: Skilled Nursing Facility | Attending: Internal Medicine | Admitting: Internal Medicine

## 2024-04-27 ENCOUNTER — Other Ambulatory Visit: Payer: Self-pay

## 2024-04-27 DIAGNOSIS — D62 Acute posthemorrhagic anemia: Secondary | ICD-10-CM | POA: Diagnosis not present

## 2024-04-27 DIAGNOSIS — Z79899 Other long term (current) drug therapy: Secondary | ICD-10-CM

## 2024-04-27 DIAGNOSIS — G250 Essential tremor: Secondary | ICD-10-CM | POA: Diagnosis present

## 2024-04-27 DIAGNOSIS — S52532A Colles' fracture of left radius, initial encounter for closed fracture: Secondary | ICD-10-CM | POA: Diagnosis present

## 2024-04-27 DIAGNOSIS — Z8542 Personal history of malignant neoplasm of other parts of uterus: Secondary | ICD-10-CM

## 2024-04-27 DIAGNOSIS — Z8249 Family history of ischemic heart disease and other diseases of the circulatory system: Secondary | ICD-10-CM | POA: Diagnosis not present

## 2024-04-27 DIAGNOSIS — S52552A Other extraarticular fracture of lower end of left radius, initial encounter for closed fracture: Secondary | ICD-10-CM | POA: Diagnosis present

## 2024-04-27 DIAGNOSIS — W19XXXA Unspecified fall, initial encounter: Principal | ICD-10-CM

## 2024-04-27 DIAGNOSIS — E785 Hyperlipidemia, unspecified: Secondary | ICD-10-CM | POA: Diagnosis present

## 2024-04-27 DIAGNOSIS — Z833 Family history of diabetes mellitus: Secondary | ICD-10-CM | POA: Diagnosis not present

## 2024-04-27 DIAGNOSIS — W1809XA Striking against other object with subsequent fall, initial encounter: Secondary | ICD-10-CM | POA: Diagnosis present

## 2024-04-27 DIAGNOSIS — E041 Nontoxic single thyroid nodule: Secondary | ICD-10-CM | POA: Diagnosis present

## 2024-04-27 DIAGNOSIS — Y92009 Unspecified place in unspecified non-institutional (private) residence as the place of occurrence of the external cause: Secondary | ICD-10-CM

## 2024-04-27 DIAGNOSIS — S72002A Fracture of unspecified part of neck of left femur, initial encounter for closed fracture: Secondary | ICD-10-CM

## 2024-04-27 DIAGNOSIS — Z808 Family history of malignant neoplasm of other organs or systems: Secondary | ICD-10-CM | POA: Diagnosis not present

## 2024-04-27 DIAGNOSIS — Z8673 Personal history of transient ischemic attack (TIA), and cerebral infarction without residual deficits: Secondary | ICD-10-CM

## 2024-04-27 DIAGNOSIS — S72142A Displaced intertrochanteric fracture of left femur, initial encounter for closed fracture: Secondary | ICD-10-CM | POA: Diagnosis present

## 2024-04-27 DIAGNOSIS — M62838 Other muscle spasm: Secondary | ICD-10-CM | POA: Diagnosis present

## 2024-04-27 DIAGNOSIS — Z8042 Family history of malignant neoplasm of prostate: Secondary | ICD-10-CM | POA: Diagnosis not present

## 2024-04-27 DIAGNOSIS — S72009A Fracture of unspecified part of neck of unspecified femur, initial encounter for closed fracture: Secondary | ICD-10-CM | POA: Diagnosis present

## 2024-04-27 DIAGNOSIS — Z86718 Personal history of other venous thrombosis and embolism: Secondary | ICD-10-CM | POA: Diagnosis not present

## 2024-04-27 DIAGNOSIS — M810 Age-related osteoporosis without current pathological fracture: Secondary | ICD-10-CM | POA: Diagnosis present

## 2024-04-27 DIAGNOSIS — I10 Essential (primary) hypertension: Secondary | ICD-10-CM | POA: Diagnosis not present

## 2024-04-27 DIAGNOSIS — Z7901 Long term (current) use of anticoagulants: Secondary | ICD-10-CM

## 2024-04-27 DIAGNOSIS — Z86711 Personal history of pulmonary embolism: Secondary | ICD-10-CM | POA: Diagnosis not present

## 2024-04-27 DIAGNOSIS — F419 Anxiety disorder, unspecified: Secondary | ICD-10-CM | POA: Diagnosis present

## 2024-04-27 DIAGNOSIS — Z888 Allergy status to other drugs, medicaments and biological substances status: Secondary | ICD-10-CM

## 2024-04-27 DIAGNOSIS — I129 Hypertensive chronic kidney disease with stage 1 through stage 4 chronic kidney disease, or unspecified chronic kidney disease: Secondary | ICD-10-CM | POA: Diagnosis present

## 2024-04-27 DIAGNOSIS — Z841 Family history of disorders of kidney and ureter: Secondary | ICD-10-CM | POA: Diagnosis not present

## 2024-04-27 DIAGNOSIS — N179 Acute kidney failure, unspecified: Secondary | ICD-10-CM | POA: Diagnosis not present

## 2024-04-27 DIAGNOSIS — D649 Anemia, unspecified: Secondary | ICD-10-CM | POA: Diagnosis present

## 2024-04-27 DIAGNOSIS — Z90722 Acquired absence of ovaries, bilateral: Secondary | ICD-10-CM

## 2024-04-27 DIAGNOSIS — G2581 Restless legs syndrome: Secondary | ICD-10-CM | POA: Diagnosis present

## 2024-04-27 DIAGNOSIS — Z90710 Acquired absence of both cervix and uterus: Secondary | ICD-10-CM

## 2024-04-27 DIAGNOSIS — Z821 Family history of blindness and visual loss: Secondary | ICD-10-CM | POA: Diagnosis not present

## 2024-04-27 DIAGNOSIS — Z885 Allergy status to narcotic agent status: Secondary | ICD-10-CM

## 2024-04-27 DIAGNOSIS — R251 Tremor, unspecified: Secondary | ICD-10-CM | POA: Diagnosis not present

## 2024-04-27 DIAGNOSIS — D72829 Elevated white blood cell count, unspecified: Secondary | ICD-10-CM | POA: Diagnosis present

## 2024-04-27 DIAGNOSIS — N189 Chronic kidney disease, unspecified: Secondary | ICD-10-CM | POA: Diagnosis present

## 2024-04-27 DIAGNOSIS — Z887 Allergy status to serum and vaccine status: Secondary | ICD-10-CM

## 2024-04-27 DIAGNOSIS — R339 Retention of urine, unspecified: Secondary | ICD-10-CM | POA: Diagnosis present

## 2024-04-27 DIAGNOSIS — F32A Depression, unspecified: Secondary | ICD-10-CM | POA: Diagnosis not present

## 2024-04-27 LAB — TYPE AND SCREEN
ABO/RH(D): A NEG
Antibody Screen: NEGATIVE

## 2024-04-27 LAB — BASIC METABOLIC PANEL WITH GFR
Anion gap: 7 (ref 5–15)
BUN: 24 mg/dL — ABNORMAL HIGH (ref 8–23)
CO2: 26 mmol/L (ref 22–32)
Calcium: 9.3 mg/dL (ref 8.9–10.3)
Chloride: 103 mmol/L (ref 98–111)
Creatinine, Ser: 1.28 mg/dL — ABNORMAL HIGH (ref 0.44–1.00)
GFR, Estimated: 40 mL/min — ABNORMAL LOW (ref >60)
Glucose, Bld: 117 mg/dL — ABNORMAL HIGH (ref 70–99)
Potassium: 4.7 mmol/L (ref 3.5–5.1)
Sodium: 136 mmol/L (ref 135–145)

## 2024-04-27 LAB — I-STAT CHEM 8, ED
BUN: 29 mg/dL — ABNORMAL HIGH (ref 8–23)
Calcium, Ion: 1.24 mmol/L (ref 1.15–1.40)
Chloride: 101 mmol/L (ref 98–111)
Creatinine, Ser: 1.2 mg/dL — ABNORMAL HIGH (ref 0.44–1.00)
Glucose, Bld: 129 mg/dL — ABNORMAL HIGH (ref 70–99)
HCT: 50 % — ABNORMAL HIGH (ref 36.0–46.0)
Hemoglobin: 17 g/dL — ABNORMAL HIGH (ref 12.0–15.0)
Potassium: 4.5 mmol/L (ref 3.5–5.1)
Sodium: 139 mmol/L (ref 135–145)
TCO2: 25 mmol/L (ref 22–32)

## 2024-04-27 LAB — CBC WITH DIFFERENTIAL/PLATELET
Abs Immature Granulocytes: 0.04 10*3/uL (ref 0.00–0.07)
Basophils Absolute: 0.1 10*3/uL (ref 0.0–0.1)
Basophils Relative: 1 %
Eosinophils Absolute: 0.2 10*3/uL (ref 0.0–0.5)
Eosinophils Relative: 2 %
HCT: 46.9 % — ABNORMAL HIGH (ref 36.0–46.0)
Hemoglobin: 15.2 g/dL — ABNORMAL HIGH (ref 12.0–15.0)
Immature Granulocytes: 1 %
Lymphocytes Relative: 29 %
Lymphs Abs: 2.5 10*3/uL (ref 0.7–4.0)
MCH: 28.2 pg (ref 26.0–34.0)
MCHC: 32.4 g/dL (ref 30.0–36.0)
MCV: 87 fL (ref 80.0–100.0)
Monocytes Absolute: 0.6 10*3/uL (ref 0.1–1.0)
Monocytes Relative: 7 %
Neutro Abs: 5.4 10*3/uL (ref 1.7–7.7)
Neutrophils Relative %: 60 %
Platelets: 503 10*3/uL — ABNORMAL HIGH (ref 150–400)
RBC: 5.39 MIL/uL — ABNORMAL HIGH (ref 3.87–5.11)
RDW: 14.4 % (ref 11.5–15.5)
WBC: 8.8 10*3/uL (ref 4.0–10.5)
nRBC: 0 % (ref 0.0–0.2)

## 2024-04-27 LAB — I-STAT CG4 LACTIC ACID, ED: Lactic Acid, Venous: 1.3 mmol/L (ref 0.5–1.9)

## 2024-04-27 LAB — ABO/RH: ABO/RH(D): A NEG

## 2024-04-27 LAB — PROTIME-INR
INR: 1.6 — ABNORMAL HIGH (ref 0.8–1.2)
Prothrombin Time: 19 s — ABNORMAL HIGH (ref 11.4–15.2)

## 2024-04-27 LAB — SURGICAL PCR SCREEN
MRSA, PCR: NEGATIVE
Staphylococcus aureus: NEGATIVE

## 2024-04-27 MED ORDER — ONDANSETRON HCL 4 MG/2ML IJ SOLN
INTRAMUSCULAR | Status: AC
  Filled 2024-04-27: qty 2

## 2024-04-27 MED ORDER — LIDOCAINE HCL (PF) 1 % IJ SOLN
30.0000 mL | Freq: Once | INTRAMUSCULAR | Status: AC
  Administered 2024-04-27: 30 mL
  Filled 2024-04-27: qty 30

## 2024-04-27 MED ORDER — HYDRALAZINE HCL 20 MG/ML IJ SOLN
5.0000 mg | Freq: Once | INTRAMUSCULAR | Status: AC
  Administered 2024-04-27: 5 mg via INTRAVENOUS
  Filled 2024-04-27: qty 1

## 2024-04-27 MED ORDER — ONDANSETRON HCL 4 MG/2ML IJ SOLN
4.0000 mg | Freq: Four times a day (QID) | INTRAMUSCULAR | Status: DC | PRN
  Administered 2024-04-27: 4 mg via INTRAVENOUS

## 2024-04-27 MED ORDER — MORPHINE SULFATE (PF) 4 MG/ML IV SOLN
4.0000 mg | Freq: Once | INTRAVENOUS | Status: AC
  Administered 2024-04-27: 4 mg via INTRAVENOUS

## 2024-04-27 MED ORDER — HYDROMORPHONE HCL 1 MG/ML IJ SOLN
0.5000 mg | INTRAMUSCULAR | Status: DC | PRN
  Administered 2024-04-27 – 2024-04-28 (×3): 0.5 mg via INTRAVENOUS
  Filled 2024-04-27 (×2): qty 0.5

## 2024-04-27 MED ORDER — HYDROMORPHONE HCL 1 MG/ML IJ SOLN
INTRAMUSCULAR | Status: AC
  Filled 2024-04-27: qty 0.5

## 2024-04-27 MED ORDER — ONDANSETRON HCL 4 MG/2ML IJ SOLN
4.0000 mg | Freq: Once | INTRAMUSCULAR | Status: AC
  Administered 2024-04-27: 4 mg via INTRAVENOUS

## 2024-04-27 MED ORDER — HYDRALAZINE HCL 20 MG/ML IJ SOLN: 10.0000 mg | Freq: Once | INTRAMUSCULAR | Status: DC

## 2024-04-27 MED ORDER — MORPHINE SULFATE (PF) 4 MG/ML IV SOLN
INTRAVENOUS | Status: AC
  Filled 2024-04-27: qty 1

## 2024-04-27 NOTE — Progress Notes (Signed)
 Informed Dr. Chiquita Councilman that left pedal pulse is absent, attempted to sue doppler but still nothing was heard. Popliteal pulse cannot be checked as patient is in pain, left fem,oral pulse is palpable. All right lower extremities pulses are intact and palpable.

## 2024-04-27 NOTE — ED Triage Notes (Signed)
 Pt bib ems form home. Pt was gardening and fell landing on left side. Deformity to left leg, - head strike, - loc. Is on eliquis . Given 100mcg of fentanyl.

## 2024-04-27 NOTE — Plan of Care (Addendum)
 HAND SURGERY BRIEF NOTE:  89 year old female who sustained a mechanical fall earlier today and was found to have a left hip intertrochanteric fracture as well as a left distal radius fracture.  Hand surgery was consulted for management of the left distal radius fracture.  Patient underwent bedside hematoma block and reduction by the emergency department for temporization of the distal radius fracture, she was placed into a sugar-tong splint.  X-rays postreduction do show improved length and alignment of the distal radius fracture, slight dorsal angulation is persistent, fracture remains extra-articular in nature, there is an associated distal ulna fracture as well with minimal displacement.  Will attempt to coordinate with the orthopedic team treating the hip fracture for further management of the distal radius fracture.  Given patient's age and comorbidities, can consider closed reduction and casting versus open reduction and internal fixation of the distal radius fracture in conjunction with the hip fracture treatment.  Formal consult to follow.  [facility], Hand Surgery

## 2024-04-27 NOTE — ED Provider Notes (Signed)
 [place] EMERGENCY DEPARTMENT AT [facility] Provider Note   CSN: [id number] Arrival date & time: 04/27/24  1659     History  Chief Complaint  Patient presents with   Monica Curry is a 89 y.o. female.  Patient is a 89 year old female who presents as a level 2 trauma.  She was doing some gardening outside and tripped and fell, landing on her left side.  She complains of pain in her left wrist and her left hip.  She has deformity to both of these extremities.  She does not want to straighten her left leg.  She does not think that she hit her head.  However she is on Eliquis  for prior PE.  She denies any neck or back pain.  She denies any rib pain or abdominal pain.  Per EMS, she was given 100 mcg of fentanyl and route.       Home Medications Prior to Admission medications   Medication Sig Start Date End Date Taking? Authorizing Provider  acetaminophen  (TYLENOL ) 500 MG tablet Take 500 mg by mouth every 6 (six) hours as needed for moderate pain.   Yes [provider]  apixaban  (ELIQUIS ) 5 MG TABS tablet Take 1 tablet (5 mg total) by mouth 2 (two) times daily. 04/01/24  Yes Christel Cousins, MD  atorvastatin  (LIPITOR) 10 MG tablet TAKE 1 TABLET BY MOUTH DAILY FOR CHOLESTEROL 04/01/24  Yes Christel Cousins, MD  Calcium  Carb-Cholecalciferol (CALCIUM  PLUS VITAMIN D  PO) Take 1 tablet by mouth daily. 1 chewable daily   Yes [provider]  Cyanocobalamin  500 MCG CHEW Chew 1 tablet by mouth daily.   Yes [provider]  escitalopram  (LEXAPRO ) 5 MG tablet Take 1 tablet (5 mg total) by mouth daily. 04/01/24  Yes Christel Cousins, MD  hydrOXYzine  (ATARAX ) 25 MG tablet Take 1 tablet (25 mg total) by mouth daily. 04/01/24  Yes Christel Cousins, MD  Magnesium  250 MG TABS Take 250 mg by mouth 2 (two) times a day.   Yes [provider]  propranolol  (INDERAL ) 40 MG tablet Take 1 tablet (40 mg total) by mouth 4 (four) times daily. TAKE 1 TABLET BY  MOUTH FOUR TIMES DAILY AT BEDTIME WITH MEALS FOR BLOOD PRESSURE OR TREMORS 04/01/24  Yes Christel Cousins, MD  VITAMIN D  PO Take 5,000 Units by mouth daily. 2 tablets daily   Yes [provider]      Allergies    Codeine, Other, Cortisone, Dexamethasone , Hctz [hydrochlorothiazide ], Meloxicam , Mysoline [primidone], Pneumovax 23 [pneumococcal vac polyvalent], Prednisone , Topiramate , Toradol  [ketorolac  tromethamine ], and Ace inhibitors    Review of Systems   Review of Systems  Constitutional:  Negative for chills, diaphoresis, fatigue and fever.  HENT:  Negative for congestion, rhinorrhea and sneezing.   Eyes: Negative.   Respiratory:  Negative for cough, chest tightness and shortness of breath.   Cardiovascular:  Negative for chest pain and leg swelling.  Gastrointestinal:  Negative for abdominal pain, blood in stool, diarrhea, nausea and vomiting.  Genitourinary:  Negative for difficulty urinating, flank pain, frequency and hematuria.  Musculoskeletal:  Positive for arthralgias. Negative for back pain.  Skin:  Negative for rash.  Neurological:  Negative for dizziness, speech difficulty, weakness, numbness and headaches.    Physical Exam Updated Vital Signs BP (!) 200/80   Pulse 65   Temp 98.1 F (36.7 C) (Oral)   Resp 17   Ht 5\' 3"  (1.6 m)   Wt 63.5 kg  SpO2 100%   BMI 24.80 kg/m  Physical Exam Constitutional:      Appearance: She is well-developed.  HENT:     Head: Normocephalic and atraumatic.  Eyes:     Pupils: Pupils are equal, round, and reactive to light.  Neck:     Comments: No pain along the spine Cardiovascular:     Rate and Rhythm: Normal rate and regular rhythm.     Heart sounds: Normal heart sounds.  Pulmonary:     Effort: Pulmonary effort is normal. No respiratory distress.     Breath sounds: Normal breath sounds. No wheezing or rales.  Chest:     Chest wall: No tenderness.  Abdominal:     General: Bowel sounds are normal.     Palpations:  Abdomen is soft.     Tenderness: There is no abdominal tenderness. There is no guarding or rebound.  Musculoskeletal:        General: Normal range of motion.     Cervical back: Normal range of motion and neck supple.     Comments: Patient has an obvious deformity to her left wrist.  Radial pulses intact.  There is no open wounds.  She is able to wiggle all her fingers.  She has normal sensation distally.  There is no pain to the elbow or shoulder.  She also has her left leg in the flexed position at the hip.  She has pain on any movement in the left hip.  She denies any pain in the hip or the ankle.  Pedal pulses intact.  Lymphadenopathy:     Cervical: No cervical adenopathy.  Skin:    General: Skin is warm and dry.     Findings: No rash.  Neurological:     Mental Status: She is alert and oriented to person, place, and time.     ED Results / Procedures / Treatments   Labs (all labs ordered are listed, but only abnormal results are displayed) Labs Reviewed  BASIC METABOLIC PANEL WITH GFR - Abnormal; Notable for the following components:      Result Value   Glucose, Bld 117 (*)    BUN 24 (*)    Creatinine, Ser 1.28 (*)    GFR, Estimated 40 (*)    All other components within normal limits  CBC WITH DIFFERENTIAL/PLATELET - Abnormal; Notable for the following components:   RBC 5.39 (*)    Hemoglobin 15.2 (*)    HCT 46.9 (*)    Platelets 503 (*)    All other components within normal limits  PROTIME-INR - Abnormal; Notable for the following components:   Prothrombin Time 19.0 (*)    INR 1.6 (*)    All other components within normal limits  I-STAT CHEM 8, ED - Abnormal; Notable for the following components:   BUN 29 (*)    Creatinine, Ser 1.20 (*)    Glucose, Bld 129 (*)    Hemoglobin 17.0 (*)    HCT 50.0 (*)    All other components within normal limits  I-STAT CG4 LACTIC ACID, ED  TYPE AND SCREEN  ABO/RH    EKG None  Radiology CT Head Wo Contrast Result Date:  04/27/2024 CLINICAL DATA:  Head trauma EXAM: CT HEAD WITHOUT CONTRAST CT CERVICAL SPINE WITHOUT CONTRAST TECHNIQUE: Multidetector CT imaging of the head and cervical spine was performed following the standard protocol without intravenous contrast. Multiplanar CT image reconstructions of the cervical spine were also generated. RADIATION DOSE REDUCTION: This exam was performed according  to the departmental dose-optimization program which includes automated exposure control, adjustment of the mA and/or kV according to patient size and/or use of iterative reconstruction technique. COMPARISON:  CT head 07/07/2021. FINDINGS: CT HEAD FINDINGS Brain: No acute intracranial hemorrhage. No CT evidence of acute infarct. Nonspecific hypoattenuation in the periventricular and subcortical white matter favored to reflect chronic microvascular ischemic changes. Mild parenchymal volume loss. No edema, mass effect, or midline shift. The basilar cisterns are patent. Multiple dural calcifications again noted. Ventricles: The ventricles are normal. Vascular: Atherosclerotic calcifications of the carotid siphons. No hyperdense vessel. Skull: No acute or aggressive finding. Degenerative changes of the mandibular condyles, greater on the right. Orbits: Orbits are symmetric. Sinuses: Mild mucosal thickening in the ethmoid sinuses. Other: Mastoid air cells are clear. CT CERVICAL SPINE FINDINGS Alignment: Normal. Skull base and vertebrae: No acute fracture. No primary bone lesion or focal pathologic process. Soft tissues and spinal canal: No prevertebral fluid or swelling. No visible canal hematoma. Prominence of the right thyroid  lobe with multiple nodules noted. Disc levels: Intervertebral disc space narrowing most pronounced posteriorly at C3-4 with additional mild disc space narrowing at C5-6 and C6-7. Small disc bulges at multiple levels. No high-grade osseous spinal canal stenosis. Facet arthrosis and uncovertebral hypertrophy throughout  the cervical spine with foraminal narrowing most pronounced on the right at C4-5 and bilaterally at C5-6. Upper chest: Septal thickening in the lung apices which could reflect mild pulmonary edema. Other: None. IMPRESSION: No CT evidence of acute intracranial abnormality. No acute fracture or traumatic malalignment of the cervical spine. Degenerative changes of the cervical spine as above. Prominence of the right thyroid  lobe with heterogeneous appearance and multiple nodules. Consider correlation with nonemergent thyroid  ultrasound. Septal thickening in the lung apices concerning for pulmonary edema. Electronically Signed   By: Denny Flack M.D.   On: 04/27/2024 18:40   CT Cervical Spine Wo Contrast Result Date: 04/27/2024 CLINICAL DATA:  Head trauma EXAM: CT HEAD WITHOUT CONTRAST CT CERVICAL SPINE WITHOUT CONTRAST TECHNIQUE: Multidetector CT imaging of the head and cervical spine was performed following the standard protocol without intravenous contrast. Multiplanar CT image reconstructions of the cervical spine were also generated. RADIATION DOSE REDUCTION: This exam was performed according to the departmental dose-optimization program which includes automated exposure control, adjustment of the mA and/or kV according to patient size and/or use of iterative reconstruction technique. COMPARISON:  CT head 07/07/2021. FINDINGS: CT HEAD FINDINGS Brain: No acute intracranial hemorrhage. No CT evidence of acute infarct. Nonspecific hypoattenuation in the periventricular and subcortical white matter favored to reflect chronic microvascular ischemic changes. Mild parenchymal volume loss. No edema, mass effect, or midline shift. The basilar cisterns are patent. Multiple dural calcifications again noted. Ventricles: The ventricles are normal. Vascular: Atherosclerotic calcifications of the carotid siphons. No hyperdense vessel. Skull: No acute or aggressive finding. Degenerative changes of the mandibular condyles,  greater on the right. Orbits: Orbits are symmetric. Sinuses: Mild mucosal thickening in the ethmoid sinuses. Other: Mastoid air cells are clear. CT CERVICAL SPINE FINDINGS Alignment: Normal. Skull base and vertebrae: No acute fracture. No primary bone lesion or focal pathologic process. Soft tissues and spinal canal: No prevertebral fluid or swelling. No visible canal hematoma. Prominence of the right thyroid  lobe with multiple nodules noted. Disc levels: Intervertebral disc space narrowing most pronounced posteriorly at C3-4 with additional mild disc space narrowing at C5-6 and C6-7. Small disc bulges at multiple levels. No high-grade osseous spinal canal stenosis. Facet arthrosis and uncovertebral hypertrophy throughout the cervical  spine with foraminal narrowing most pronounced on the right at C4-5 and bilaterally at C5-6. Upper chest: Septal thickening in the lung apices which could reflect mild pulmonary edema. Other: None. IMPRESSION: No CT evidence of acute intracranial abnormality. No acute fracture or traumatic malalignment of the cervical spine. Degenerative changes of the cervical spine as above. Prominence of the right thyroid  lobe with heterogeneous appearance and multiple nodules. Consider correlation with nonemergent thyroid  ultrasound. Septal thickening in the lung apices concerning for pulmonary edema. Electronically Signed   By: Denny Flack M.D.   On: 04/27/2024 18:40   DG Hip Unilat W or Wo Pelvis 2-3 Views Left Result Date: 04/27/2024 CLINICAL DATA:  Left hip pain after fall today. EXAM: DG HIP (WITH OR WITHOUT PELVIS) 2-3V LEFT COMPARISON:  None Available. FINDINGS: Moderately displaced proximal left femoral neck fracture is noted. Right hip is unremarkable. IMPRESSION: Moderately displaced proximal left femoral neck fracture. Electronically Signed   By: Rosalene Colon M.D.   On: 04/27/2024 18:06   DG Wrist Complete Left Result Date: 04/27/2024 CLINICAL DATA:  Left wrist pain after  fall. EXAM: LEFT WRIST - COMPLETE 3+ VIEW COMPARISON:  None Available. FINDINGS: Moderately displaced distal left radial fracture is noted with posterior displacement of distal fragments. Minimally displaced distal left ulnar fracture is noted. Moderate degenerative changes seen involving first carpometacarpal joint. IMPRESSION: Displaced distal left radial and ulnar fractures as noted above. Electronically Signed   By: Rosalene Colon M.D.   On: 04/27/2024 18:05   DG Chest Port 1 View Result Date: 04/27/2024 CLINICAL DATA:  Fall. EXAM: PORTABLE CHEST 1 VIEW COMPARISON:  June 14, 2021. FINDINGS: Mild cardiomegaly with mild central pulmonary vascular congestion is noted. No consolidative process is noted. The visualized skeletal structures are unremarkable. IMPRESSION: Mild cardiomegaly with mild central pulmonary vascular congestion. Electronically Signed   By: Rosalene Colon M.D.   On: 04/27/2024 18:02    Procedures .Reduction of fracture  Date/Time: 04/27/2024 7:26 PM  Performed by: Hershel Los, MD Authorized by: Hershel Los, MD  Consent: Verbal consent obtained. Risks and benefits: risks, benefits and alternatives were discussed Consent given by: patient Local anesthesia used: yes Anesthesia: hematoma block  Anesthesia: Local anesthesia used: yes Local Anesthetic: lidocaine 1% without epinephrine  Sedation: Patient sedated: no  Patient tolerance: patient tolerated the procedure well with no immediate complications       Medications Ordered in ED Medications  morphine (PF) 4 MG/ML injection 4 mg ( Intravenous Not Given 04/27/24 1743)  ondansetron (ZOFRAN) injection 4 mg ( Intravenous Not Given 04/27/24 1743)  hydrALAZINE (APRESOLINE) injection 5 mg (5 mg Intravenous Given 04/27/24 1841)  lidocaine (PF) (XYLOCAINE) 1 % injection 30 mL (30 mLs Other Given 04/27/24 1842)    ED Course/ Medical Decision Making/ A&P                                 Medical Decision  Making Amount and/or Complexity of Data Reviewed Labs: ordered. Radiology: ordered.  Risk Prescription drug management. Decision regarding hospitalization.   Patient is a 89 year old who presents as a level 2 trauma after mechanical fall.  She has some deformity of her left wrist and left hip.  X-rays were performed which show a Colles' type fracture with dorsal angulation of her left wrist and an intertrochanteric fracture of her left hip.  These were interpreted by me and confirmed by the radiologist.  There is  also a distal ulna fracture.  Discussed with Dr. Hulda Mage with orthopedic surgery who will plan on repair of the hip fracture.  Discussed with Dr. Marce Sensing with hand surgery.  He request for us  to do a reduction.  This was performed and patient was placed in a sugar-tong splint.  She had a CT scan of her head and cervical spine which do not show any acute abnormalities.  Discussed with Dr. Chiquita Councilman who will admit the patient for further treatment.  Final Clinical Impression(s) / ED Diagnoses Final diagnoses:  Fall, initial encounter  Hypertension, unspecified type  Closed fracture of left hip, initial encounter [facility])  Closed Colles' fracture of left radius, initial encounter    Rx / DC Orders ED Discharge Orders     None         Hershel Los, MD 04/27/24 1929

## 2024-04-27 NOTE — ED Notes (Signed)
 Delay in transport per Primary RN: Xray at bedside, provider at bedside.

## 2024-04-27 NOTE — Progress Notes (Signed)
 Orthopedic Tech Progress Note Patient Details:  Monica Curry January 21, 1931 [id number] Level 2 Trauma Ortho Devices Type of Ortho Device: Short arm splint Ortho Device/Splint Location: LUE Ortho Device/Splint Interventions: Ordered, Application   Post Interventions Patient Tolerated: Well  Delynn Fill 04/27/2024, 6:14 PM

## 2024-04-27 NOTE — Progress Notes (Signed)
 Orthopedic Tech Progress Note Patient Details:  Monica Curry [birth year] [id number]  Ortho Devices Type of Ortho Device: Sugartong splint Ortho Device/Splint Location: LUE Ortho Device/Splint Interventions: Application, Ordered, Adjustment   Post Interventions Patient Tolerated: Well Instructions Provided: Care of device Previous splint removed by EDP and slightly reduced at bedside, sugartong applied. [name] 04/27/2024, 7:26 PM

## 2024-04-27 NOTE — Consult Note (Addendum)
 Brief orthopedic consult note:  I was called by the EDP for this patient.  89 year old ambulatory female who fell at home.  She lives alone.  She had pain in her left hip and in her left wrist.  X-rays revealed a left displaced distal radius fracture and left intertrochanteric hip fracture.  I was consulted for the hip fracture.  I reviewed the images.  Patient is indicated for surgery.  Plan for admission to the hospitalist team.  Keep n.p.o. past midnight for surgery tomorrow.  Bedrest for now.  Formal orthopedic consult to follow.

## 2024-04-27 NOTE — Plan of Care (Signed)
  Problem: Skin Integrity: Goal: Risk for impaired skin integrity will decrease Outcome: Not Progressing   Problem: Safety: Goal: Ability to remain free from injury will improve Outcome: Not Progressing   Problem: Pain Managment: Goal: General experience of comfort will improve and/or be controlled Outcome: Not Progressing   Problem: Elimination: Goal: Will not experience complications related to urinary retention Outcome: Not Progressing

## 2024-04-27 NOTE — ED Notes (Signed)
 Trauma Response Nurse Documentation  Ceaira Ernster is a 89 y.o. female arriving to [facility] ED via EMS  On Eliquis  (apixaban ) daily. Trauma was activated as a Level 2 based on the following trauma criteria Elderly patients > 65 with head trauma on anti-coagulation (excluding ASA).  Patient cleared for CT by Dr. Nolia Baumgartner. Pt transported to CT with trauma response nurse present to monitor. RN remained with the patient throughout their absence from the department for clinical observation. GCS 15.  History   Past Medical History:  Diagnosis Date   Allergy    Medications and pollen   Anemia    Anxiety    Always   C. difficile colitis    Cancer (HCC)    Endometrial cancer   Cataract    Had surgery 2021   Chronic kidney disease 2018   2 siblings had kidney failure, sister had transplant   Clotting disorder (HCC)    Essential and other specified forms of tremor    Hyperlipidemia    Hypertension    Osteoporosis 11/2019   T score -2.8 stable from prior DEXA   Personal history of venous thrombosis and embolism 1997   Pulmonary embolism (HCC) 06/14/2021   Pulmonary embolus (HCC)    Shingles    Stroke (HCC) 2018   TIA   Thrush    TIA (transient ischemic attack)    carotid US  07/2022: no ICA stenosis bilaterally     Past Surgical History:  Procedure Laterality Date   ABDOMINAL HYSTERECTOMY  1997   APPENDECTOMY  1961   cataract surg Bilateral 2021   Dr. Gennie Kicks, R eye 8/18, L 8/25   CESAREAN SECTION  1961   EYE SURGERY  2021   Cataract both eyes   TONSILLECTOMY AND ADENOIDECTOMY  1944   TOTAL ABDOMINAL HYSTERECTOMY W/ BILATERAL SALPINGOOPHORECTOMY  1997     Initial Focused Assessment (If applicable, or please see trauma documentation): Patient A&Ox4, GCS 15, PERR 3 Airway intact, bilateral breath sounds Pulses 2+ Obvious deformity to L wrist and L hip shortening  CT's Completed:   CT Head and CT C-Spine   Interventions:  IV, labs CXR/PXR CT Head/Cspine 4mg   Morphine/4mg  Zofran  Plan for disposition:  Admission to floor   Consults completed:  Orthopaedic Surgeon at see note.  Event Summary: Patient to ED after falling while trying to paint chairs outside. Per patient she had no LOC, remembers event and stepped wrong causing her to fall. Imaging was ordered and revealed a fx in L wrist and L hip. Orthopedics was consulted and recommend surgery for fixation. Patient requested daughter who lives out [place] not be called at this time. Plan is for admission to medicine and surgery tomorrow.  Bedside handoff with ED RN Renay Carota.    Henri Loft Scotlynn Noyes  Trauma Response RN  Please call TRN at [phone] for further assistance.

## 2024-04-28 ENCOUNTER — Inpatient Hospital Stay (HOSPITAL_COMMUNITY): Admitting: Anesthesiology

## 2024-04-28 ENCOUNTER — Ambulatory Visit

## 2024-04-28 ENCOUNTER — Other Ambulatory Visit: Payer: Self-pay

## 2024-04-28 ENCOUNTER — Encounter (HOSPITAL_COMMUNITY): Payer: Self-pay | Admitting: Internal Medicine

## 2024-04-28 ENCOUNTER — Encounter (HOSPITAL_COMMUNITY)
Admission: EM | Disposition: A | Discharge status: Skilled Nursing Facility | Payer: Self-pay | Source: Home / Self Care | Attending: Internal Medicine

## 2024-04-28 ENCOUNTER — Inpatient Hospital Stay (HOSPITAL_COMMUNITY)

## 2024-04-28 DIAGNOSIS — S52532A Colles' fracture of left radius, initial encounter for closed fracture: Secondary | ICD-10-CM | POA: Diagnosis not present

## 2024-04-28 DIAGNOSIS — Z7901 Long term (current) use of anticoagulants: Secondary | ICD-10-CM

## 2024-04-28 DIAGNOSIS — I10 Essential (primary) hypertension: Secondary | ICD-10-CM

## 2024-04-28 DIAGNOSIS — F419 Anxiety disorder, unspecified: Secondary | ICD-10-CM

## 2024-04-28 DIAGNOSIS — R251 Tremor, unspecified: Secondary | ICD-10-CM

## 2024-04-28 DIAGNOSIS — S72142A Displaced intertrochanteric fracture of left femur, initial encounter for closed fracture: Secondary | ICD-10-CM

## 2024-04-28 DIAGNOSIS — S72002A Fracture of unspecified part of neck of left femur, initial encounter for closed fracture: Secondary | ICD-10-CM

## 2024-04-28 HISTORY — PX: INTRAMEDULLARY (IM) NAIL INTERTROCHANTERIC: SHX5875

## 2024-04-28 LAB — MRSA NEXT GEN BY PCR, NASAL: MRSA by PCR Next Gen: NOT DETECTED

## 2024-04-28 SURGERY — FIXATION, FRACTURE, INTERTROCHANTERIC, WITH INTRAMEDULLARY ROD
Anesthesia: General | Laterality: Left

## 2024-04-28 MED ORDER — METOCLOPRAMIDE HCL 5 MG/ML IJ SOLN: 5.0000 mg | Freq: Three times a day (TID) | INTRAMUSCULAR | Status: DC | PRN

## 2024-04-28 MED ORDER — TRANEXAMIC ACID-NACL 1000-0.7 MG/100ML-% IV SOLN
1000.0000 mg | Freq: Once | INTRAVENOUS | Status: AC
  Administered 2024-04-28: 1000 mg via INTRAVENOUS
  Filled 2024-04-28: qty 100

## 2024-04-28 MED ORDER — CHLORHEXIDINE GLUCONATE CLOTH 2 % EX PADS
6.0000 | MEDICATED_PAD | Freq: Every day | CUTANEOUS | Status: DC
  Administered 2024-04-28 – 2024-05-01 (×4): 6 via TOPICAL

## 2024-04-28 MED ORDER — DEXAMETHASONE SODIUM PHOSPHATE 10 MG/ML IJ SOLN
INTRAMUSCULAR | Status: DC | PRN
  Administered 2024-04-28: 10 mg via INTRAVENOUS

## 2024-04-28 MED ORDER — CEFAZOLIN SODIUM-DEXTROSE 2-4 GM/100ML-% IV SOLN
2.0000 g | Freq: Three times a day (TID) | INTRAVENOUS | Status: AC
  Administered 2024-04-29 (×3): 2 g via INTRAVENOUS
  Filled 2024-04-28 (×3): qty 100

## 2024-04-28 MED ORDER — ONDANSETRON HCL 4 MG/2ML IJ SOLN
INTRAMUSCULAR | Status: DC | PRN
  Administered 2024-04-28: 4 mg via INTRAVENOUS

## 2024-04-28 MED ORDER — TRANEXAMIC ACID-NACL 1000-0.7 MG/100ML-% IV SOLN
INTRAVENOUS | Status: AC
  Filled 2024-04-28: qty 100

## 2024-04-28 MED ORDER — TRANEXAMIC ACID-NACL 1000-0.7 MG/100ML-% IV SOLN: 1000.0000 mg | INTRAVENOUS | Status: DC

## 2024-04-28 MED ORDER — SUGAMMADEX SODIUM 200 MG/2ML IV SOLN
INTRAVENOUS | Status: DC | PRN
  Administered 2024-04-28: 200 mg via INTRAVENOUS

## 2024-04-28 MED ORDER — EPHEDRINE SULFATE-NACL 50-0.9 MG/10ML-% IV SOSY
PREFILLED_SYRINGE | INTRAVENOUS | Status: DC | PRN
  Administered 2024-04-28: 5 mg via INTRAVENOUS

## 2024-04-28 MED ORDER — ESCITALOPRAM OXALATE 10 MG PO TABS: 5.0000 mg | ORAL_TABLET | Freq: Every day | ORAL | Status: DC

## 2024-04-28 MED ORDER — OXYCODONE HCL 5 MG PO TABS
2.5000 mg | ORAL_TABLET | ORAL | Status: DC | PRN
  Administered 2024-04-28 – 2024-05-01 (×3): 5 mg via ORAL
  Filled 2024-04-28 (×3): qty 1

## 2024-04-28 MED ORDER — PROPRANOLOL HCL 20 MG PO TABS
40.0000 mg | ORAL_TABLET | Freq: Four times a day (QID) | ORAL | Status: DC
Start: 2024-04-28 — End: 2024-05-04
  Administered 2024-04-28 – 2024-05-04 (×20): 40 mg via ORAL
  Filled 2024-04-28 (×22): qty 2

## 2024-04-28 MED ORDER — FENTANYL CITRATE (PF) 250 MCG/5ML IJ SOLN
INTRAMUSCULAR | Status: AC
Start: 2024-04-28 — End: ?
  Filled 2024-04-28: qty 5

## 2024-04-28 MED ORDER — LACTATED RINGERS IV SOLN: INTRAVENOUS | Status: AC

## 2024-04-28 MED ORDER — CEFAZOLIN SODIUM-DEXTROSE 2-4 GM/100ML-% IV SOLN
INTRAVENOUS | Status: AC
  Filled 2024-04-28: qty 100

## 2024-04-28 MED ORDER — METHOCARBAMOL 500 MG PO TABS
500.0000 mg | ORAL_TABLET | Freq: Three times a day (TID) | ORAL | Status: DC | PRN
  Administered 2024-04-28 – 2024-05-02 (×5): 500 mg via ORAL
  Filled 2024-04-28 (×5): qty 1

## 2024-04-28 MED ORDER — FENTANYL CITRATE (PF) 100 MCG/2ML IJ SOLN
25.0000 ug | INTRAMUSCULAR | Status: DC | PRN
  Administered 2024-04-28 (×3): 50 ug via INTRAVENOUS

## 2024-04-28 MED ORDER — ORAL CARE MOUTH RINSE: 15.0000 mL | Freq: Once | OROMUCOSAL | Status: AC

## 2024-04-28 MED ORDER — ATORVASTATIN CALCIUM 10 MG PO TABS
10.0000 mg | ORAL_TABLET | Freq: Every day | ORAL | Status: DC
  Administered 2024-04-29 – 2024-05-04 (×6): 10 mg via ORAL
  Filled 2024-04-28 (×6): qty 1

## 2024-04-28 MED ORDER — HYDROXYZINE HCL 25 MG PO TABS
25.0000 mg | ORAL_TABLET | Freq: Every day | ORAL | Status: DC
  Administered 2024-04-28 – 2024-05-04 (×7): 25 mg via ORAL
  Filled 2024-04-28 (×7): qty 1

## 2024-04-28 MED ORDER — OXYCODONE HCL 5 MG PO TABS
2.5000 mg | ORAL_TABLET | ORAL | Status: DC | PRN
  Administered 2024-04-28: 2.5 mg via ORAL
  Filled 2024-04-28: qty 1

## 2024-04-28 MED ORDER — LACTATED RINGERS IV SOLN: INTRAVENOUS | Status: DC

## 2024-04-28 MED ORDER — MAGNESIUM OXIDE -MG SUPPLEMENT 400 (240 MG) MG PO TABS
400.0000 mg | ORAL_TABLET | Freq: Two times a day (BID) | ORAL | Status: DC
  Administered 2024-04-28 – 2024-05-04 (×12): 400 mg via ORAL
  Filled 2024-04-28 (×13): qty 1

## 2024-04-28 MED ORDER — FENTANYL CITRATE (PF) 100 MCG/2ML IJ SOLN
INTRAMUSCULAR | Status: AC
Start: 2024-04-28 — End: ?
  Filled 2024-04-28: qty 2

## 2024-04-28 MED ORDER — PROPOFOL 10 MG/ML IV BOLUS
INTRAVENOUS | Status: DC | PRN
  Administered 2024-04-28: 80 mg via INTRAVENOUS

## 2024-04-28 MED ORDER — POLYETHYLENE GLYCOL 3350 17 G PO PACK: 17.0000 g | PACK | Freq: Every day | ORAL | Status: DC | PRN

## 2024-04-28 MED ORDER — FENTANYL CITRATE (PF) 250 MCG/5ML IJ SOLN
INTRAMUSCULAR | Status: DC | PRN
  Administered 2024-04-28 (×2): 50 ug via INTRAVENOUS

## 2024-04-28 MED ORDER — CEFAZOLIN SODIUM-DEXTROSE 2-4 GM/100ML-% IV SOLN: 2.0000 g | Freq: Three times a day (TID) | INTRAVENOUS | Status: DC

## 2024-04-28 MED ORDER — POVIDONE-IODINE 10 % EX SWAB: 2.0000 | Freq: Once | CUTANEOUS | Status: DC

## 2024-04-28 MED ORDER — ESCITALOPRAM OXALATE 10 MG PO TABS
5.0000 mg | ORAL_TABLET | Freq: Every day | ORAL | Status: DC
  Administered 2024-04-28 – 2024-05-03 (×6): 5 mg via ORAL
  Filled 2024-04-28 (×6): qty 1

## 2024-04-28 MED ORDER — CHLORHEXIDINE GLUCONATE 0.12 % MT SOLN
OROMUCOSAL | Status: AC
  Filled 2024-04-28: qty 15

## 2024-04-28 MED ORDER — LACTATED RINGERS IV SOLN: INTRAVENOUS | Status: DC | PRN

## 2024-04-28 MED ORDER — ROCURONIUM BROMIDE 10 MG/ML (PF) SYRINGE
PREFILLED_SYRINGE | INTRAVENOUS | Status: DC | PRN
  Administered 2024-04-28: 40 mg via INTRAVENOUS

## 2024-04-28 MED ORDER — FENTANYL CITRATE (PF) 100 MCG/2ML IJ SOLN
INTRAMUSCULAR | Status: AC
  Filled 2024-04-28: qty 2

## 2024-04-28 MED ORDER — HYDROMORPHONE HCL 1 MG/ML IJ SOLN
0.5000 mg | INTRAMUSCULAR | Status: DC | PRN
  Administered 2024-04-28: 0.5 mg via INTRAVENOUS
  Filled 2024-04-28: qty 0.5

## 2024-04-28 MED ORDER — HYDROMORPHONE HCL 1 MG/ML IJ SOLN: 0.5000 mg | INTRAMUSCULAR | Status: DC | PRN

## 2024-04-28 MED ORDER — 0.9 % SODIUM CHLORIDE (POUR BTL) OPTIME
TOPICAL | Status: DC | PRN
  Administered 2024-04-28: 1000 mL

## 2024-04-28 MED ORDER — CEFAZOLIN SODIUM-DEXTROSE 2-4 GM/100ML-% IV SOLN
2.0000 g | INTRAVENOUS | Status: AC
  Administered 2024-04-28: 2 g via INTRAVENOUS

## 2024-04-28 MED ORDER — DOCUSATE SODIUM 100 MG PO CAPS
100.0000 mg | ORAL_CAPSULE | Freq: Two times a day (BID) | ORAL | Status: DC
  Administered 2024-04-28 – 2024-05-03 (×7): 100 mg via ORAL
  Filled 2024-04-28 (×12): qty 1

## 2024-04-28 MED ORDER — ACETAMINOPHEN 325 MG PO TABS
650.0000 mg | ORAL_TABLET | Freq: Four times a day (QID) | ORAL | Status: DC
  Administered 2024-04-28 – 2024-05-04 (×25): 650 mg via ORAL
  Filled 2024-04-28 (×25): qty 2

## 2024-04-28 MED ORDER — CHLORHEXIDINE GLUCONATE 0.12 % MT SOLN: 15.0000 mL | Freq: Once | OROMUCOSAL | Status: AC

## 2024-04-28 MED ORDER — METOCLOPRAMIDE HCL 5 MG PO TABS: 5.0000 mg | ORAL_TABLET | Freq: Three times a day (TID) | ORAL | Status: DC | PRN

## 2024-04-28 MED ORDER — PHENYLEPHRINE 80 MCG/ML (10ML) SYRINGE FOR IV PUSH (FOR BLOOD PRESSURE SUPPORT)
PREFILLED_SYRINGE | INTRAVENOUS | Status: DC | PRN
  Administered 2024-04-28: 160 ug via INTRAVENOUS
  Administered 2024-04-28: 80 ug via INTRAVENOUS

## 2024-04-28 MED ORDER — LIDOCAINE 2% (20 MG/ML) 5 ML SYRINGE
INTRAMUSCULAR | Status: DC | PRN
  Administered 2024-04-28: 60 mg via INTRAVENOUS

## 2024-04-28 MED ORDER — CHLORHEXIDINE GLUCONATE 4 % EX SOLN: 60.0000 mL | Freq: Once | CUTANEOUS | Status: DC

## 2024-04-28 SURGICAL SUPPLY — 38 items
BAG COUNTER SPONGE SURGICOUNT (BAG) IMPLANT
BIT DRILL INTERTAN LAG SCREW (BIT) IMPLANT
BIT DRILL LONG 4.0 (BIT) IMPLANT
BRUSH SCRUB EZ PLAIN DRY (MISCELLANEOUS) ×4 IMPLANT
CHLORAPREP W/TINT 26 (MISCELLANEOUS) ×2 IMPLANT
COVER PERINEAL POST (MISCELLANEOUS) ×2 IMPLANT
COVER SURGICAL LIGHT HANDLE (MISCELLANEOUS) ×2 IMPLANT
DERMABOND ADVANCED .7 DNX12 (GAUZE/BANDAGES/DRESSINGS) ×2 IMPLANT
DRAPE C-ARM 35X43 STRL (DRAPES) ×2 IMPLANT
DRAPE IMP U-DRAPE 54X76 (DRAPES) ×4 IMPLANT
DRAPE INCISE IOBAN 66X45 STRL (DRAPES) ×2 IMPLANT
DRAPE STERI IOBAN 125X83 (DRAPES) ×2 IMPLANT
DRAPE SURG 17X23 STRL (DRAPES) ×4 IMPLANT
DRAPE U-SHAPE 47X51 STRL (DRAPES) ×2 IMPLANT
DRESSING MEPILEX FLEX 4X4 (GAUZE/BANDAGES/DRESSINGS) ×2 IMPLANT
DRSG MEPILEX POST OP 4X8 (GAUZE/BANDAGES/DRESSINGS) ×2 IMPLANT
ELECTRODE REM PT RTRN 9FT ADLT (ELECTROSURGICAL) ×2 IMPLANT
GLOVE BIO SURGEON STRL SZ 6.5 (GLOVE) ×6 IMPLANT
GLOVE BIO SURGEON STRL SZ7.5 (GLOVE) ×8 IMPLANT
GLOVE BIOGEL PI IND STRL 6.5 (GLOVE) ×2 IMPLANT
GLOVE BIOGEL PI IND STRL 7.5 (GLOVE) ×2 IMPLANT
GOWN STRL REUS W/ TWL LRG LVL3 (GOWN DISPOSABLE) ×2 IMPLANT
KIT BASIN OR (CUSTOM PROCEDURE TRAY) ×2 IMPLANT
KIT TURNOVER KIT B (KITS) ×2 IMPLANT
MANIFOLD NEPTUNE II (INSTRUMENTS) ×2 IMPLANT
NAIL INTERTAN 10X18 130D 10S (Nail) IMPLANT
NS IRRIG 1000ML POUR BTL (IV SOLUTION) ×2 IMPLANT
PACK GENERAL/GYN (CUSTOM PROCEDURE TRAY) ×2 IMPLANT
PAD ARMBOARD POSITIONER FOAM (MISCELLANEOUS) ×4 IMPLANT
PIN GUIDE 3.2X343MM (PIN) IMPLANT
SCREW LAG COMPR KIT 100/95 (Screw) IMPLANT
SCREW TRIGEN LOW PROF 5.0X35 (Screw) IMPLANT
SUT MNCRL AB 3-0 PS2 18 (SUTURE) ×2 IMPLANT
SUT MON AB 2-0 CT1 36 (SUTURE) IMPLANT
SUT VIC AB 0 CT1 27XBRD ANBCTR (SUTURE) IMPLANT
SUT VIC AB 2-0 CT1 TAPERPNT 27 (SUTURE) ×4 IMPLANT
TOWEL GREEN STERILE (TOWEL DISPOSABLE) ×4 IMPLANT
WATER STERILE IRR 1000ML POUR (IV SOLUTION) ×2 IMPLANT

## 2024-04-28 NOTE — Op Note (Signed)
 Orthopaedic Surgery Operative Note (CSN: [id number] ) Date of Surgery: 04/28/2024  Admit Date: 04/27/2024   Diagnoses: Pre-Op Diagnoses: Left intertrochanteric femur fracture Left extra-articular distal radius fracture  Post-Op Diagnosis: Same  Procedures: CPT 27245-Cephalomedullary nailing of left intertrochanteric femur fracture  Surgeons : Primary: Laneta Pintos, MD  Assistant: [name], PA-C  Location: OR 3   Anesthesia: General   Antibiotics: Ancef 2g preop   Tourniquet time: None    Estimated Blood Loss: 50 mL  Complications:* No complications entered in OR log *   Specimens:* No specimens in log *   Implants: Implant Name Type Inv. Item Serial No. Manufacturer Lot No. LRB No. Used Action  SCREW LAG COMPR KIT 100/95 - [id number] Screw SCREW LAG COMPR KIT 100/95  [facility] [employer] [id number] Left 1 Implanted  NAIL INTERTAN 10X18 130D 10S - [id number] Nail NAIL INTERTAN 10X18 130D 10S  [employer] [id number] Left 1 Implanted  SCREW TRIGEN LOW PROF 5.0X35 - [id number] Screw SCREW TRIGEN LOW PROF 5.0X35  [employer] [id number] Left 1 Implanted     Indications for Surgery: 89 year old female who sustained a ground-level fall with a left intertrochanteric femur fracture and a left extra-articular distal radius fracture.  Due to the unstable nature of her hip fracture I recommend proceeding with cephalomedullary nailing of the left hip.  Risks and benefits were discussed with the patient and her daughter.  Risks include but not limited to bleeding, infection, malunion, nonunion, hardware failure, hardware irritation, nerve and blood vessel injury, DVT, and the possibility anesthetic complications.  We also discussed operative versus nonoperative management for the distal radius fracture.  They decided on nonoperative management.  Operative Findings: 1.  Cephalomedullary nailing of the left intertrochanteric femur fracture  using [employer]AN 10 x 180 mm nail with a 100 mm lag screw and a 95 mm compression screw.  Procedure: The patient was identified in the preoperative holding area. Consent was confirmed with the patient and their family and all questions were answered. The operative extremity was marked after confirmation with the patient. she was then brought back to the operating room by our anesthesia colleagues.  She was placed under general anesthetic and carefully transferred over to the [facility] table.  All bony prominences were well-padded.  Traction was applied to the left lower extremity and fluoroscopic imaging showed adequate alignment.  The left lower extremity then was prepped and draped in usual sterile fashion.  A timeout was performed to verify the patient, the procedure, and the extremity.  Preoperative antibiotics were dosed.  A small incision proximal to the greater trochanter was made and carried down through skin and subcutaneous tissue.  I directed the threaded guidewire to tip of the greater trochanter and advanced into the proximal metaphysis.  I then used an entry reamer to enter the medullary canal.  I then passed a 10 x 180 mm nail down the center of the canal attached to the targeting arm.  I then used the targeting arm to direct a threaded guidewire up into the head/neck segment.  I confirmed adequate tip apex distance using fluoroscopy.  I then measured the length and decided to use 100 mm lag screw.  I drilled the path for the compression screw and placed an antirotation bar.  I then drilled the path for the lag screw and placed a 100 mm lag screw.  I then placed the compression screw and compressed approximately 5 mm.  I then statically  locked the proximal portion of the nail.  I used the targeting arm to place a lateral to medial distal locking screw.  The targeting arm was removed and final fluoroscopic imaging was obtained.  The incisions were copiously irrigated and closed with 2-0  Monocryl and Dermabond.  Sterile dressings were applied.  The patient was then awoke from anesthesia and taken to the PACU in stable condition.  Post Op Plan/Instructions: The patient will be weightbearing as tolerated to the left lower extremity.  She will receive postoperative Ancef.  She will be restarted on her Eliquis  postoperative day 1.  Will have her mobilize with physical occupational therapy.  I was present and performed the entire surgery.  [name], PA-C did assist me throughout the case. An assistant was necessary given the difficulty in approach, maintenance of reduction and ability to instrument the fracture.   Katheryne Pane, MD [facility]

## 2024-04-28 NOTE — Consult Note (Signed)
 Reason for Consult:Left hip fx Referring Physician: Maylene Spear Time called: 1610 Time at bedside: 0853   Monica Curry is an 89 y.o. female.  HPI: Monica Curry was painting some furniture when she tripped over something and fell. She had immediate left wrist and hip pain and could not get up. She was brought to the ED where x-rays showed fxs of both and orthopedic and hand surgery were consulted. She lives at home alone, does not use any assistive devices, and is RHD.  Past Medical History:  Diagnosis Date   Allergy    Medications and pollen   Anemia    Anxiety    Always   C. difficile colitis    Cancer (HCC)    Endometrial cancer   Cataract    Had surgery 2021   Chronic kidney disease 2018   2 siblings had kidney failure, sister had transplant   Clotting disorder (HCC)    Essential and other specified forms of tremor    Hyperlipidemia    Hypertension    Osteoporosis 11/2019   T score -2.8 stable from prior DEXA   Personal history of venous thrombosis and embolism 1997   Pulmonary embolism (HCC) 06/14/2021   Pulmonary embolus (HCC)    Shingles    Stroke (HCC) 2018   TIA   Thrush    TIA (transient ischemic attack)    carotid US  07/2022: no ICA stenosis bilaterally    Past Surgical History:  Procedure Laterality Date   ABDOMINAL HYSTERECTOMY  1997   APPENDECTOMY  1961   cataract surg Bilateral 2021   Dr. Gennie Kicks, R eye 8/18, L 8/25   CESAREAN SECTION  1961   EYE SURGERY  2021   Cataract both eyes   TONSILLECTOMY AND ADENOIDECTOMY  1944   TOTAL ABDOMINAL HYSTERECTOMY W/ BILATERAL SALPINGOOPHORECTOMY  1997    Family History  Problem Relation Age of Onset   Hypertension Mother    Cancer Mother        Unknown type   Dementia Mother    Varicose Veins Mother    Cancer Father        Bone   Heart disease Sister    Blindness Sister 44       temporal arteritis   Skin cancer Sister        basal and melanoma   Heart disease Sister    Kidney failure Sister         Kidney transplant   Diabetes Sister    Hypertension Sister    Kidney disease Sister    Vision loss Sister    Varicose Veins Sister    Heart disease Brother    Prostate cancer Brother        Prostate   Heart disease Brother    Kidney failure Brother    Prostate cancer Brother        Prostate   Heart disease Brother    Cancer Brother    Kidney disease Brother    Vision loss Brother    Tremor Neg Hx    Parkinson's disease Neg Hx     Social History:  reports that she has never smoked. She has never used smokeless tobacco. She reports that she does not currently use alcohol . She reports that she does not currently use drugs after having used the following drugs: Benzodiazepines.  Allergies:  Allergies  Allergen Reactions   Codeine Hives   Other Other (See Comments)    Reaction: Fever (intolerance)   Cortisone  Causes jitteriness.   Dexamethasone      anxiety   Hctz [Hydrochlorothiazide ] Other (See Comments)    Low sodium   Meloxicam  Nausea Only    Gastritis and nausea   Mysoline [Primidone]    Pneumovax 23 [Pneumococcal Vac Polyvalent]     Red, raised area.  Knot at site of injection   Prednisone      Anxiety   Topiramate      Sleepiness, numbness feet tingling in arm.   Toradol  [Ketorolac  Tromethamine ] Swelling    Localized redness at injection site.     Ace Inhibitors Cough    Medications: I have reviewed the patient's current medications.  Results for orders placed or performed during the hospital encounter of 04/27/24 (from the past 48 hours)  I-stat chem 8, ed     Status: Abnormal   Collection Time: 04/27/24  5:09 PM  Result Value Ref Range   Sodium 139 135 - 145 mmol/L   Potassium 4.5 3.5 - 5.1 mmol/L   Chloride 101 98 - 111 mmol/L   BUN 29 (H) 8 - 23 mg/dL   Creatinine, Ser 6.04 (H) 0.44 - 1.00 mg/dL   Glucose, Bld 540 (H) 70 - 99 mg/dL    Comment: Glucose reference range applies only to samples taken after fasting for at least 8 hours.   Calcium , Ion  1.24 1.15 - 1.40 mmol/L   TCO2 25 22 - 32 mmol/L   Hemoglobin 17.0 (H) 12.0 - 15.0 g/dL   HCT 98.1 (H) 19.1 - 47.8 %  I-Stat CG4 Lactic Acid, ED     Status: None   Collection Time: 04/27/24  5:09 PM  Result Value Ref Range   Lactic Acid, Venous 1.3 0.5 - 1.9 mmol/L  Type and screen [facility]     Status: None   Collection Time: 04/27/24  5:12 PM  Result Value Ref Range   ABO/RH(D) A NEG    Antibody Screen NEG    Sample Expiration      04/30/2024,2359 Performed at [facility], [address] [place], [place] [address]   Basic metabolic panel     Status: Abnormal   Collection Time: 04/27/24  5:24 PM  Result Value Ref Range   Sodium 136 135 - 145 mmol/L   Potassium 4.7 3.5 - 5.1 mmol/L   Chloride 103 98 - 111 mmol/L   CO2 26 22 - 32 mmol/L   Glucose, Bld 117 (H) 70 - 99 mg/dL    Comment: Glucose reference range applies only to samples taken after fasting for at least 8 hours.   BUN 24 (H) 8 - 23 mg/dL   Creatinine, Ser 1.30 (H) 0.44 - 1.00 mg/dL   Calcium  9.3 8.9 - 10.3 mg/dL   GFR, Estimated 40 (L) >60 mL/min    Comment: (NOTE) Calculated using the CKD-EPI Creatinine Equation (2021)    Anion gap 7 5 - 15    Comment: Performed at [facility], [address], [place], [place] [address]  CBC with Differential     Status: Abnormal   Collection Time: 04/27/24  5:24 PM  Result Value Ref Range   WBC 8.8 4.0 - 10.5 K/uL   RBC 5.39 (H) 3.87 - 5.11 MIL/uL   Hemoglobin 15.2 (H) 12.0 - 15.0 g/dL   HCT 46.9 (H) 62.9 - 52.8 %   MCV 87.0 80.0 - 100.0 fL   MCH 28.2 26.0 - 34.0 pg   MCHC 32.4 30.0 - 36.0 g/dL  RDW 14.4 11.5 - 15.5 %   Platelets 503 (H) 150 - 400 K/uL   nRBC 0.0 0.0 - 0.2 %   Neutrophils Relative % 60 %   Neutro Abs 5.4 1.7 - 7.7 K/uL   Lymphocytes Relative 29 %   Lymphs Abs 2.5 0.7 - 4.0 K/uL   Monocytes Relative 7 %   Monocytes Absolute 0.6 0.1 - 1.0 K/uL   Eosinophils Relative 2 %   Eosinophils Absolute 0.2 0.0 - 0.5 K/uL    Basophils Relative 1 %   Basophils Absolute 0.1 0.0 - 0.1 K/uL   Immature Granulocytes 1 %   Abs Immature Granulocytes 0.04 0.00 - 0.07 K/uL    Comment: Performed at [facility], [address], [place], [place] [address]  Protime-INR     Status: Abnormal   Collection Time: 04/27/24  5:24 PM  Result Value Ref Range   Prothrombin Time 19.0 (H) 11.4 - 15.2 seconds   INR 1.6 (H) 0.8 - 1.2    Comment: (NOTE) INR goal varies based on device and disease states. Performed at [facility], [address], [place], [place] [address]   ABO/Rh     Status: None   Collection Time: 04/27/24  5:27 PM  Result Value Ref Range   ABO/RH(D)      A NEG Performed at [facility], [address] [place], [place] [address]   Surgical pcr screen     Status: None   Collection Time: 04/27/24  8:41 PM   Specimen: Nasal Mucosa; Nasal Swab  Result Value Ref Range   MRSA, PCR NEGATIVE NEGATIVE   Staphylococcus aureus NEGATIVE NEGATIVE    Comment: (NOTE) The Xpert SA Assay (FDA approved for NASAL specimens in patients 53 years of age and older), is one component of a comprehensive surveillance program. It is not intended to diagnose infection nor to guide or monitor treatment. Performed at [facility], [address], [place], [place] [address]     DG Wrist Complete Left Result Date: 04/27/2024 CLINICAL DATA:  Postreduction EXAM: LEFT WRIST - COMPLETE 3+ VIEW COMPARISON:  Earlier today FINDINGS: In cast views of the left wrist demonstrate distal left radial and ulnar fractures. Continued mild posterior displacement of the distal fracture fragments, but alignment is improved since prior study. IMPRESSION: Improved alignment with continued mild posterior displacement. Electronically Signed   By: Janeece Mechanic M.D.   On: 04/27/2024 20:16   DG Elbow Complete Left Result Date: 04/27/2024 CLINICAL DATA:  Fall, landed on left side EXAM: LEFT ELBOW - COMPLETE 3+ VIEW COMPARISON:  None  Available. FINDINGS: No acute bony abnormality. Specifically, no fracture, subluxation, or dislocation. No joint effusion. IMPRESSION: No acute bony abnormality. Electronically Signed   By: Janeece Mechanic M.D.   On: 04/27/2024 20:15   DG FEMUR MIN 2 VIEWS LEFT Result Date: 04/27/2024 CLINICAL DATA:  Left femur fracture EXAM: LEFT FEMUR 2 VIEWS COMPARISON:  None Available. FINDINGS: There is a left femoral intertrochanteric fracture. Varus angulation. No subluxation or dislocation. No additional femoral fracture. No joint effusion in the left knee. IMPRESSION: Angulated left femoral intertrochanteric fracture. Electronically Signed   By: Janeece Mechanic M.D.   On: 04/27/2024 20:14   CT Head Wo Contrast Result Date: 04/27/2024 CLINICAL DATA:  Head trauma EXAM: CT HEAD WITHOUT CONTRAST CT CERVICAL SPINE WITHOUT CONTRAST TECHNIQUE: Multidetector CT imaging of the head and cervical spine was performed following the standard protocol without intravenous contrast. Multiplanar CT  image reconstructions of the cervical spine were also generated. RADIATION DOSE REDUCTION: This exam was performed according to the departmental dose-optimization program which includes automated exposure control, adjustment of the mA and/or kV according to patient size and/or use of iterative reconstruction technique. COMPARISON:  CT head 07/07/2021. FINDINGS: CT HEAD FINDINGS Brain: No acute intracranial hemorrhage. No CT evidence of acute infarct. Nonspecific hypoattenuation in the periventricular and subcortical white matter favored to reflect chronic microvascular ischemic changes. Mild parenchymal volume loss. No edema, mass effect, or midline shift. The basilar cisterns are patent. Multiple dural calcifications again noted. Ventricles: The ventricles are normal. Vascular: Atherosclerotic calcifications of the carotid siphons. No hyperdense vessel. Skull: No acute or aggressive finding. Degenerative changes of the mandibular condyles,  greater on the right. Orbits: Orbits are symmetric. Sinuses: Mild mucosal thickening in the ethmoid sinuses. Other: Mastoid air cells are clear. CT CERVICAL SPINE FINDINGS Alignment: Normal. Skull base and vertebrae: No acute fracture. No primary bone lesion or focal pathologic process. Soft tissues and spinal canal: No prevertebral fluid or swelling. No visible canal hematoma. Prominence of the right thyroid  lobe with multiple nodules noted. Disc levels: Intervertebral disc space narrowing most pronounced posteriorly at C3-4 with additional mild disc space narrowing at C5-6 and C6-7. Small disc bulges at multiple levels. No high-grade osseous spinal canal stenosis. Facet arthrosis and uncovertebral hypertrophy throughout the cervical spine with foraminal narrowing most pronounced on the right at C4-5 and bilaterally at C5-6. Upper chest: Septal thickening in the lung apices which could reflect mild pulmonary edema. Other: None. IMPRESSION: No CT evidence of acute intracranial abnormality. No acute fracture or traumatic malalignment of the cervical spine. Degenerative changes of the cervical spine as above. Prominence of the right thyroid  lobe with heterogeneous appearance and multiple nodules. Consider correlation with nonemergent thyroid  ultrasound. Septal thickening in the lung apices concerning for pulmonary edema. Electronically Signed   By: Denny Flack M.D.   On: 04/27/2024 18:40   CT Cervical Spine Wo Contrast Result Date: 04/27/2024 CLINICAL DATA:  Head trauma EXAM: CT HEAD WITHOUT CONTRAST CT CERVICAL SPINE WITHOUT CONTRAST TECHNIQUE: Multidetector CT imaging of the head and cervical spine was performed following the standard protocol without intravenous contrast. Multiplanar CT image reconstructions of the cervical spine were also generated. RADIATION DOSE REDUCTION: This exam was performed according to the departmental dose-optimization program which includes automated exposure control, adjustment of  the mA and/or kV according to patient size and/or use of iterative reconstruction technique. COMPARISON:  CT head 07/07/2021. FINDINGS: CT HEAD FINDINGS Brain: No acute intracranial hemorrhage. No CT evidence of acute infarct. Nonspecific hypoattenuation in the periventricular and subcortical white matter favored to reflect chronic microvascular ischemic changes. Mild parenchymal volume loss. No edema, mass effect, or midline shift. The basilar cisterns are patent. Multiple dural calcifications again noted. Ventricles: The ventricles are normal. Vascular: Atherosclerotic calcifications of the carotid siphons. No hyperdense vessel. Skull: No acute or aggressive finding. Degenerative changes of the mandibular condyles, greater on the right. Orbits: Orbits are symmetric. Sinuses: Mild mucosal thickening in the ethmoid sinuses. Other: Mastoid air cells are clear. CT CERVICAL SPINE FINDINGS Alignment: Normal. Skull base and vertebrae: No acute fracture. No primary bone lesion or focal pathologic process. Soft tissues and spinal canal: No prevertebral fluid or swelling. No visible canal hematoma. Prominence of the right thyroid  lobe with multiple nodules noted. Disc levels: Intervertebral disc space narrowing most pronounced posteriorly at C3-4 with additional mild disc space narrowing at C5-6 and C6-7. Small disc bulges  at multiple levels. No high-grade osseous spinal canal stenosis. Facet arthrosis and uncovertebral hypertrophy throughout the cervical spine with foraminal narrowing most pronounced on the right at C4-5 and bilaterally at C5-6. Upper chest: Septal thickening in the lung apices which could reflect mild pulmonary edema. Other: None. IMPRESSION: No CT evidence of acute intracranial abnormality. No acute fracture or traumatic malalignment of the cervical spine. Degenerative changes of the cervical spine as above. Prominence of the right thyroid  lobe with heterogeneous appearance and multiple nodules. Consider  correlation with nonemergent thyroid  ultrasound. Septal thickening in the lung apices concerning for pulmonary edema. Electronically Signed   By: Denny Flack M.D.   On: 04/27/2024 18:40   DG Hip Unilat W or Wo Pelvis 2-3 Views Left Result Date: 04/27/2024 CLINICAL DATA:  Left hip pain after fall today. EXAM: DG HIP (WITH OR WITHOUT PELVIS) 2-3V LEFT COMPARISON:  None Available. FINDINGS: Moderately displaced proximal left femoral neck fracture is noted. Right hip is unremarkable. IMPRESSION: Moderately displaced proximal left femoral neck fracture. Electronically Signed   By: Rosalene Colon M.D.   On: 04/27/2024 18:06   DG Wrist Complete Left Result Date: 04/27/2024 CLINICAL DATA:  Left wrist pain after fall. EXAM: LEFT WRIST - COMPLETE 3+ VIEW COMPARISON:  None Available. FINDINGS: Moderately displaced distal left radial fracture is noted with posterior displacement of distal fragments. Minimally displaced distal left ulnar fracture is noted. Moderate degenerative changes seen involving first carpometacarpal joint. IMPRESSION: Displaced distal left radial and ulnar fractures as noted above. Electronically Signed   By: Rosalene Colon M.D.   On: 04/27/2024 18:05   DG Chest Port 1 View Result Date: 04/27/2024 CLINICAL DATA:  Fall. EXAM: PORTABLE CHEST 1 VIEW COMPARISON:  June 14, 2021. FINDINGS: Mild cardiomegaly with mild central pulmonary vascular congestion is noted. No consolidative process is noted. The visualized skeletal structures are unremarkable. IMPRESSION: Mild cardiomegaly with mild central pulmonary vascular congestion. Electronically Signed   By: Rosalene Colon M.D.   On: 04/27/2024 18:02    Review of Systems  HENT:  Negative for ear discharge, ear pain, hearing loss and tinnitus.   Eyes:  Negative for photophobia and pain.  Respiratory:  Negative for cough and shortness of breath.   Cardiovascular:  Negative for chest pain.  Gastrointestinal:  Negative for abdominal pain, nausea  and vomiting.  Genitourinary:  Negative for dysuria, flank pain, frequency and urgency.  Musculoskeletal:  Positive for arthralgias (Left hip and wrist). Negative for back pain, myalgias and neck pain.  Neurological:  Negative for dizziness and headaches.  Hematological:  Does not bruise/bleed easily.  Psychiatric/Behavioral:  The patient is not nervous/anxious.    Blood pressure 130/64, pulse 71, temperature 98.5 F (36.9 C), temperature source Oral, resp. rate 17, height 5\' 3"  (1.6 m), weight 63.5 kg, SpO2 91%. Physical Exam Constitutional:      General: She is not in acute distress.    Appearance: She is well-developed. She is not diaphoretic.  HENT:     Head: Normocephalic and atraumatic.  Eyes:     General: No scleral icterus.       Right eye: No discharge.        Left eye: No discharge.     Conjunctiva/sclera: Conjunctivae normal.  Cardiovascular:     Rate and Rhythm: Normal rate and regular rhythm.  Pulmonary:     Effort: Pulmonary effort is normal. No respiratory distress.  Musculoskeletal:     Cervical back: Normal range of motion.  Comments: Left shoulder, elbow, wrist, digits- Splint in place, no instability, no blocks to motion  Sens  Ax/R/M/U intact  Mot   Ax/ R/ PIN/ M/ AIN/ U intact  Fingers perfused  LLE No traumatic wounds, ecchymosis, or rash  Mod TTP hip  No knee or ankle effusion  Knee stable to varus/ valgus and anterior/posterior stress  Sens DPN, SPN, TN intact  Motor EHL, ext, flex, evers 5/5  DP 2+, PT 2+, No significant edema  Skin:    General: Skin is warm and dry.  Neurological:     Mental Status: She is alert.  Psychiatric:        Mood and Affect: Mood normal.        Behavior: Behavior normal.     Assessment/Plan: Left hip fx -- Plan IMN today with Dr. Curtiss Dowdy. Please keep NPO. Left wrist fx -- Plan non-operative management. Dr. Agarwala managing. Multiple medical problems including history of blood clots on lifelong Eliquis , essential  tremor, hypertension, h/o TIA, and anxiety -- per primary service    Georganna Kin, PA-C Orthopedic Surgery [phone] 04/28/2024, 9:01 AM

## 2024-04-28 NOTE — Progress Notes (Signed)
 Bladder scan performed on patient as she last urinated at 1530, patient noted to have 287 mls, informed Denece Finger, NP. Alternating cold and warm compress initiated as patient states she has history of being uncomfortable to use purewick, but due to fracture is hesitant to use bed pan as well.

## 2024-04-28 NOTE — Consult Note (Signed)
 HAND SURGERY CONSULTATION  REQUESTING PHYSICIAN: Maylene Spear, MD   Chief Complaint: Left wrist fracture  HPI: Monica Curry is a 89 y.o. female who presents after mechanical fall with notable pain to the left hip and wrist.  She was seen in the emergency department setting overnight, underwent clinical and radiographic workup which showed a left hip intertrochanteric femur fracture as well as an associated left distal radius fracture.  She underwent bedside hematoma block and closed reduction of the left wrist overnight with improved length and alignment of the distal radius, she was subsequently placed into a sugar-tong splint by the emergency department.  Today, she is comfortable at rest, denies any significant numbness or tingling in the left hand, splint remains in place, pain controlled at rest.  Hand dominance: Right   Past Medical History:  Diagnosis Date   Allergy    Medications and pollen   Anemia    Anxiety    Always   C. difficile colitis    Cancer [facility])    Endometrial cancer   Cataract    Had surgery 2021   Chronic kidney disease 2018   2 siblings had kidney failure, sister had transplant   Clotting disorder (HCC)    Essential and other specified forms of tremor    Hyperlipidemia    Hypertension    Osteoporosis 11/2019   T score -2.8 stable from prior DEXA   Personal history of venous thrombosis and embolism 1997   Pulmonary embolism (HCC) 06/14/2021   Pulmonary embolus (HCC)    Shingles    Stroke (HCC) 2018   TIA   Thrush    TIA (transient ischemic attack)    carotid US  07/2022: no ICA stenosis bilaterally   Past Surgical History:  Procedure Laterality Date   ABDOMINAL HYSTERECTOMY  1997   APPENDECTOMY  1961   cataract surg Bilateral 2021   Dr. Gennie Kicks, R eye 8/18, L 8/25   CESAREAN SECTION  1961   EYE SURGERY  2021   Cataract both eyes   TONSILLECTOMY AND ADENOIDECTOMY  1944   TOTAL ABDOMINAL HYSTERECTOMY W/ BILATERAL  SALPINGOOPHORECTOMY  1997   Social History   Socioeconomic History   Marital status: Widowed    Spouse name: Not on file   Number of children: 2   Years of education: Not on file   Highest education level: Bachelor's degree (e.g., BA, AB, BS)  Occupational History   Not on file  Tobacco Use   Smoking status: Never   Smokeless tobacco: Never  Vaping Use   Vaping status: Never Used  Substance and Sexual Activity   Alcohol  use: Not Currently   Drug use: Not Currently    Types: Benzodiazepines    Comment: prescribed   Sexual activity: Not Currently    Birth control/protection: Post-menopausal, Surgical    Comment: HYST-1st intercourse 89 yo-Fewer than 5 partners  Other Topics Concern   Not on file  Social History Narrative   Right handed   Caffeine: none   Lives alone   1st dau dec 3 days old   2 grands   Dau in [place]   Social Drivers of Health   Financial Resource Strain: Low Risk  (03/28/2024)   Overall Financial Resource Strain (CARDIA)    Difficulty of Paying Living Expenses: Not hard at all  Food Insecurity: No Food Insecurity (04/27/2024)   Hunger Vital Sign    Worried About Running Out of Food in the Last Year: Never true  Ran Out of Food in the Last Year: Never true  Transportation Needs: No Transportation Needs (04/27/2024)   PRAPARE - Administrator, Civil Service (Medical): No    Lack of Transportation (Non-Medical): No  Physical Activity: Unknown (03/28/2024)   Exercise Vital Sign    Days of Exercise per Week: 0 days    Minutes of Exercise per Session: Not on file  Stress: Stress Concern Present (03/28/2024)   [employer] of Occupational Health - Occupational Stress Questionnaire    Feeling of Stress : To some extent  Social Connections: Moderately Integrated (04/27/2024)   Social Connection and Isolation Panel [NHANES]    Frequency of Communication with Friends and Family: More than three times a week    Frequency of Social Gatherings with  Friends and Family: Once a week    Attends Religious Services: More than 4 times per year    Active Member of [employer] or Organizations: Yes    Attends Banker Meetings: More than 4 times per year    Marital Status: Widowed   Family History  Problem Relation Age of Onset   Hypertension Mother    Cancer Mother        Unknown type   Dementia Mother    Varicose Veins Mother    Cancer Father        Bone   Heart disease Sister    Blindness Sister 86       temporal arteritis   Skin cancer Sister        basal and melanoma   Heart disease Sister    Kidney failure Sister        Kidney transplant   Diabetes Sister    Hypertension Sister    Kidney disease Sister    Vision loss Sister    Varicose Veins Sister    Heart disease Brother    Prostate cancer Brother        Prostate   Heart disease Brother    Kidney failure Brother    Prostate cancer Brother        Prostate   Heart disease Brother    Cancer Brother    Kidney disease Brother    Vision loss Brother    Tremor Neg Hx    Parkinson's disease Neg Hx    - negative except otherwise stated in the family history section Allergies  Allergen Reactions   Codeine Hives   Other Other (See Comments)    Reaction: Fever (intolerance)   Cortisone     Causes jitteriness.   Dexamethasone      anxiety   Hctz [Hydrochlorothiazide ] Other (See Comments)    Low sodium   Meloxicam  Nausea Only    Gastritis and nausea   Mysoline [Primidone]    Pneumovax 23 [Pneumococcal Vac Polyvalent]     Red, raised area.  Knot at site of injection   Prednisone      Anxiety   Topiramate      Sleepiness, numbness feet tingling in arm.   Toradol  [Ketorolac  Tromethamine ] Swelling    Localized redness at injection site.     Ace Inhibitors Cough   Prior to Admission medications   Medication Sig Start Date End Date Taking? Authorizing Provider  acetaminophen  (TYLENOL ) 500 MG tablet Take 500 mg by mouth every 6 (six) hours as needed for  moderate pain.   Yes [provider]  apixaban  (ELIQUIS ) 5 MG TABS tablet Take 1 tablet (5 mg total) by mouth 2 (two) times daily. 04/01/24  Yes Christel Cousins, MD  atorvastatin  (LIPITOR) 10 MG tablet TAKE 1 TABLET BY MOUTH DAILY FOR CHOLESTEROL 04/01/24  Yes Christel Cousins, MD  Calcium  Carb-Cholecalciferol (CALCIUM  PLUS VITAMIN D  PO) Take 1 tablet by mouth daily. 1 chewable daily   Yes [provider]  Cyanocobalamin  500 MCG CHEW Chew 1 tablet by mouth daily.   Yes [provider]  escitalopram  (LEXAPRO ) 5 MG tablet Take 1 tablet (5 mg total) by mouth daily. 04/01/24  Yes Christel Cousins, MD  hydrOXYzine  (ATARAX ) 25 MG tablet Take 1 tablet (25 mg total) by mouth daily. 04/01/24  Yes Christel Cousins, MD  Magnesium  250 MG TABS Take 250 mg by mouth 2 (two) times a day.   Yes [provider]  propranolol  (INDERAL ) 40 MG tablet Take 1 tablet (40 mg total) by mouth 4 (four) times daily. TAKE 1 TABLET BY MOUTH FOUR TIMES DAILY AT BEDTIME WITH MEALS FOR BLOOD PRESSURE OR TREMORS 04/01/24  Yes Christel Cousins, MD  VITAMIN D  PO Take 5,000 Units by mouth daily. 2 tablets daily   Yes [provider]   DG Wrist Complete Left Result Date: 04/27/2024 CLINICAL DATA:  Postreduction EXAM: LEFT WRIST - COMPLETE 3+ VIEW COMPARISON:  Earlier today FINDINGS: In cast views of the left wrist demonstrate distal left radial and ulnar fractures. Continued mild posterior displacement of the distal fracture fragments, but alignment is improved since prior study. IMPRESSION: Improved alignment with continued mild posterior displacement. Electronically Signed   By: Janeece Mechanic M.D.   On: 04/27/2024 20:16   DG Elbow Complete Left Result Date: 04/27/2024 CLINICAL DATA:  Fall, landed on left side EXAM: LEFT ELBOW - COMPLETE 3+ VIEW COMPARISON:  None Available. FINDINGS: No acute bony abnormality. Specifically, no fracture, subluxation, or dislocation. No joint effusion. IMPRESSION: No  acute bony abnormality. Electronically Signed   By: Janeece Mechanic M.D.   On: 04/27/2024 20:15   DG FEMUR MIN 2 VIEWS LEFT Result Date: 04/27/2024 CLINICAL DATA:  Left femur fracture EXAM: LEFT FEMUR 2 VIEWS COMPARISON:  None Available. FINDINGS: There is a left femoral intertrochanteric fracture. Varus angulation. No subluxation or dislocation. No additional femoral fracture. No joint effusion in the left knee. IMPRESSION: Angulated left femoral intertrochanteric fracture. Electronically Signed   By: Janeece Mechanic M.D.   On: 04/27/2024 20:14   CT Head Wo Contrast Result Date: 04/27/2024 CLINICAL DATA:  Head trauma EXAM: CT HEAD WITHOUT CONTRAST CT CERVICAL SPINE WITHOUT CONTRAST TECHNIQUE: Multidetector CT imaging of the head and cervical spine was performed following the standard protocol without intravenous contrast. Multiplanar CT image reconstructions of the cervical spine were also generated. RADIATION DOSE REDUCTION: This exam was performed according to the departmental dose-optimization program which includes automated exposure control, adjustment of the mA and/or kV according to patient size and/or use of iterative reconstruction technique. COMPARISON:  CT head 07/07/2021. FINDINGS: CT HEAD FINDINGS Brain: No acute intracranial hemorrhage. No CT evidence of acute infarct. Nonspecific hypoattenuation in the periventricular and subcortical white matter favored to reflect chronic microvascular ischemic changes. Mild parenchymal volume loss. No edema, mass effect, or midline shift. The basilar cisterns are patent. Multiple dural calcifications again noted. Ventricles: The ventricles are normal. Vascular: Atherosclerotic calcifications of the carotid siphons. No hyperdense vessel. Skull: No acute or aggressive finding. Degenerative changes of the mandibular condyles, greater on the right. Orbits: Orbits are symmetric. Sinuses: Mild mucosal thickening in the ethmoid sinuses. Other: Mastoid air cells are clear.  CT CERVICAL SPINE  FINDINGS Alignment: Normal. Skull base and vertebrae: No acute fracture. No primary bone lesion or focal pathologic process. Soft tissues and spinal canal: No prevertebral fluid or swelling. No visible canal hematoma. Prominence of the right thyroid  lobe with multiple nodules noted. Disc levels: Intervertebral disc space narrowing most pronounced posteriorly at C3-4 with additional mild disc space narrowing at C5-6 and C6-7. Small disc bulges at multiple levels. No high-grade osseous spinal canal stenosis. Facet arthrosis and uncovertebral hypertrophy throughout the cervical spine with foraminal narrowing most pronounced on the right at C4-5 and bilaterally at C5-6. Upper chest: Septal thickening in the lung apices which could reflect mild pulmonary edema. Other: None. IMPRESSION: No CT evidence of acute intracranial abnormality. No acute fracture or traumatic malalignment of the cervical spine. Degenerative changes of the cervical spine as above. Prominence of the right thyroid  lobe with heterogeneous appearance and multiple nodules. Consider correlation with nonemergent thyroid  ultrasound. Septal thickening in the lung apices concerning for pulmonary edema. Electronically Signed   By: Denny Flack M.D.   On: 04/27/2024 18:40   CT Cervical Spine Wo Contrast Result Date: 04/27/2024 CLINICAL DATA:  Head trauma EXAM: CT HEAD WITHOUT CONTRAST CT CERVICAL SPINE WITHOUT CONTRAST TECHNIQUE: Multidetector CT imaging of the head and cervical spine was performed following the standard protocol without intravenous contrast. Multiplanar CT image reconstructions of the cervical spine were also generated. RADIATION DOSE REDUCTION: This exam was performed according to the departmental dose-optimization program which includes automated exposure control, adjustment of the mA and/or kV according to patient size and/or use of iterative reconstruction technique. COMPARISON:  CT head 07/07/2021. FINDINGS: CT HEAD  FINDINGS Brain: No acute intracranial hemorrhage. No CT evidence of acute infarct. Nonspecific hypoattenuation in the periventricular and subcortical white matter favored to reflect chronic microvascular ischemic changes. Mild parenchymal volume loss. No edema, mass effect, or midline shift. The basilar cisterns are patent. Multiple dural calcifications again noted. Ventricles: The ventricles are normal. Vascular: Atherosclerotic calcifications of the carotid siphons. No hyperdense vessel. Skull: No acute or aggressive finding. Degenerative changes of the mandibular condyles, greater on the right. Orbits: Orbits are symmetric. Sinuses: Mild mucosal thickening in the ethmoid sinuses. Other: Mastoid air cells are clear. CT CERVICAL SPINE FINDINGS Alignment: Normal. Skull base and vertebrae: No acute fracture. No primary bone lesion or focal pathologic process. Soft tissues and spinal canal: No prevertebral fluid or swelling. No visible canal hematoma. Prominence of the right thyroid  lobe with multiple nodules noted. Disc levels: Intervertebral disc space narrowing most pronounced posteriorly at C3-4 with additional mild disc space narrowing at C5-6 and C6-7. Small disc bulges at multiple levels. No high-grade osseous spinal canal stenosis. Facet arthrosis and uncovertebral hypertrophy throughout the cervical spine with foraminal narrowing most pronounced on the right at C4-5 and bilaterally at C5-6. Upper chest: Septal thickening in the lung apices which could reflect mild pulmonary edema. Other: None. IMPRESSION: No CT evidence of acute intracranial abnormality. No acute fracture or traumatic malalignment of the cervical spine. Degenerative changes of the cervical spine as above. Prominence of the right thyroid  lobe with heterogeneous appearance and multiple nodules. Consider correlation with nonemergent thyroid  ultrasound. Septal thickening in the lung apices concerning for pulmonary edema. Electronically Signed    By: Denny Flack M.D.   On: 04/27/2024 18:40   DG Hip Unilat W or Wo Pelvis 2-3 Views Left Result Date: 04/27/2024 CLINICAL DATA:  Left hip pain after fall today. EXAM: DG HIP (WITH OR WITHOUT PELVIS) 2-3V LEFT COMPARISON:  None  Available. FINDINGS: Moderately displaced proximal left femoral neck fracture is noted. Right hip is unremarkable. IMPRESSION: Moderately displaced proximal left femoral neck fracture. Electronically Signed   By: Rosalene Colon M.D.   On: 04/27/2024 18:06   DG Wrist Complete Left Result Date: 04/27/2024 CLINICAL DATA:  Left wrist pain after fall. EXAM: LEFT WRIST - COMPLETE 3+ VIEW COMPARISON:  None Available. FINDINGS: Moderately displaced distal left radial fracture is noted with posterior displacement of distal fragments. Minimally displaced distal left ulnar fracture is noted. Moderate degenerative changes seen involving first carpometacarpal joint. IMPRESSION: Displaced distal left radial and ulnar fractures as noted above. Electronically Signed   By: Rosalene Colon M.D.   On: 04/27/2024 18:05   DG Chest Port 1 View Result Date: 04/27/2024 CLINICAL DATA:  Fall. EXAM: PORTABLE CHEST 1 VIEW COMPARISON:  June 14, 2021. FINDINGS: Mild cardiomegaly with mild central pulmonary vascular congestion is noted. No consolidative process is noted. The visualized skeletal structures are unremarkable. IMPRESSION: Mild cardiomegaly with mild central pulmonary vascular congestion. Electronically Signed   By: Rosalene Colon M.D.   On: 04/27/2024 18:02   - Positive ROS: All other systems have been reviewed and were otherwise negative with the exception of those mentioned in the HPI and as above.  Physical Exam: General: No acute distress, resting comfortably Cardiovascular: BUE warm and well perfused, normal rate Respiratory: Normal WOB on RA Skin: Warm and dry Neurologic: Sensation intact distally Psychiatric: Patient is at baseline mood and affect  Left upper  Extremity  Sugar-tong splint in place, digits remain well exposed - Able to perform digital range of motion, sensation intact in all distributions median/radial/ulnar, AIN/PIN/interosseous intact, normal color capillary refill to the digits    Assessment: 89 year old female with left distal radius fracture with associated left hip intratrochanteric fracture  Plan: Patient is to undergo left hip fracture fixation by the trauma service during this admission.  Based on her age and comorbidities, her left distal radius fracture is extra-articular in nature and minimally displaced, we can proceed with nonoperative care of this injury.  I did explain to the patient the ongoing process of fracture healing as well as the possibility for repeat displacement or shortening with time that may warrant surgical intervention.  Hand surgery will be available moving forward as needed.  Discussed with treating trauma surgeon for the hip fracture.  Thank you for the consult and the opportunity to see Ms. Monica  Monica Curry) Marce Sensing, M.D. [facility]

## 2024-04-28 NOTE — Interval H&P Note (Signed)
 History and Physical Interval Note:  04/28/2024 3:37 PM  Monica Curry  has presented today for surgery, with the diagnosis of Left intertrochanteric femur fracture.  The various methods of treatment have been discussed with the patient and family. After consideration of risks, benefits and other options for treatment, the patient has consented to  Procedure(s): FIXATION, FRACTURE, INTERTROCHANTERIC, WITH INTRAMEDULLARY ROD (Left) as a surgical intervention.  The patient's history has been reviewed, patient examined, no change in status, stable for surgery.  I have reviewed the patient's chart and labs.  Questions were answered to the patient's satisfaction.     Yeny Schmoll P Antoinett Dorman

## 2024-04-28 NOTE — Anesthesia Procedure Notes (Signed)
 Procedure Name: Intubation Date/Time: 04/28/2024 4:53 PM  Performed by: Hebert Littler, CRNAPre-anesthesia Checklist: Patient identified, Emergency Drugs available, Suction available, Patient being monitored and Timeout performed Patient Re-evaluated:Patient Re-evaluated prior to induction Oxygen Delivery Method: Circle system utilized Preoxygenation: Pre-oxygenation with 100% oxygen Induction Type: IV induction Ventilation: Mask ventilation without difficulty Laryngoscope Size: Glidescope and 3 Grade View: Grade I Tube size: 7.0 mm Number of attempts: 1 Airway Equipment and Method: Video-laryngoscopy and Patient positioned with wedge pillow Placement Confirmation: ETT inserted through vocal cords under direct vision, positive ETCO2, CO2 detector and breath sounds checked- equal and bilateral Secured at: 21 cm Tube secured with: Tape

## 2024-04-28 NOTE — Progress Notes (Addendum)
 [facility] PROGRESS NOTE   Monica Curry ONG:[id number] DOB: August 25, 1931 DOA: 04/27/2024  PCP: Christel Cousins, MD  Brief History: 89 y.o. 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  Patient had a mechanical fall resulting in left hip fracture and left distal radius fracture.  She was hospitalized for further management.    Consultants: Orthopedics.  Hand surgeon  Procedures: Plan is for surgery later today    Subjective/Interval History: Patient complains of pain in the left hip area.  No chest pain or shortness of breath.  No nausea or vomiting.    Assessment/Plan:  Left hip fracture Plan is for surgical repair later today.  Pain control.  PT and OT eval after surgery.  Left distal radius fracture Patient underwent closed reduction of the left wrist in the emergency department with improved length and alignment of the distal radius.  She was seen by hand surgery.  No plans for surgical intervention at this time.  Outpatient follow-up.  Pain control.  Essential hypertension Elevated blood pressure readings overnight likely due to pain issues.  Started back on propranolol .  Blood pressure is well-controlled this morning.  Essential tremors Propranolol .  History of pulmonary embolism She has had more than 1 episode of same.  Is on lifelong anticoagulation.  Apixaban  is on hold for surgery.  No clear indication for bridging at this time.  Last PE at least in our system was in 2022.  Thyroid  Nodule Patient is aware. Has had previous work up for same. Outpatient management.  DVT Prophylaxis: Apixaban  on hold for surgery Code Status: Full code Family Communication: Discussed with patient Disposition Plan: To be determined    Medications: Scheduled:  acetaminophen   650 mg Oral Q6H   [START ON 04/29/2024] atorvastatin   10 mg Oral Daily   escitalopram   5 mg Oral QHS    magnesium  oxide  400 mg Oral BID   propranolol   40 mg Oral QID   Continuous:  lactated ringers 75 mL/hr at 04/28/24 0506   PRN:HYDROmorphone (DILAUDID) injection, methocarbamol, ondansetron (ZOFRAN) IV, oxyCODONE  Antibiotics: Anti-infectives (From admission, onward)    None       Objective:  Vital Signs  Vitals:   04/27/24 2022 04/28/24 0043 04/28/24 0356 04/28/24 0801  BP: 138/81 129/62 (!) 121/54 130/64  Pulse: 78 72 66 71  Resp: 20 16 17    Temp: 98.6 F (37 C) 98 F (36.7 C) 98.4 F (36.9 C) 98.5 F (36.9 C)  TempSrc: Oral   Oral  SpO2: 98% 91% 90% 91%  Weight:      Height:        Intake/Output Summary (Last 24 hours) at 04/28/2024 1116 Last data filed at 04/28/2024 0506 Gross per 24 hour  Intake 217.18 ml  Output --  Net 217.18 ml   Filed Weights   04/27/24 1702  Weight: 63.5 kg    General appearance: Awake alert.  In no distress Resp: Clear to auscultation bilaterally.  Normal effort Cardio: S1-S2 is normal regular.  No S3-S4.  No rubs murmurs or bruit GI: Abdomen is soft.  Nontender nondistended.  Bowel sounds are present normal.  No masses organomegaly Extremities: Left lower leg externally rotated.  Left arm is in a dressing Neurologic: Alert and oriented x3.  No focal neurological deficits.    Lab Results:  Data Reviewed: I have personally reviewed following labs and reports of the imaging studies  CBC: Recent Labs  Lab 04/27/24 1709 04/27/24 1724  WBC  --  8.8  NEUTROABS  --  5.4  HGB 17.0* 15.2*  HCT 50.0* 46.9*  MCV  --  87.0  PLT  --  503*    Basic Metabolic Panel: Recent Labs  Lab 04/27/24 1709 04/27/24 1724  NA 139 136  K 4.5 4.7  CL 101 103  CO2  --  26  GLUCOSE 129* 117*  BUN 29* 24*  CREATININE 1.20* 1.28*  CALCIUM   --  9.3    GFR: Estimated Creatinine Clearance: 26.2 mL/min (A) (by C-G formula based on SCr of 1.28 mg/dL (H)).  Coagulation Profile: Recent Labs  Lab 04/27/24 1724  INR 1.6*     Recent  Results (from the past 240 hours)  Surgical pcr screen     Status: None   Collection Time: 04/27/24  8:41 PM   Specimen: Nasal Mucosa; Nasal Swab  Result Value Ref Range Status   MRSA, PCR NEGATIVE NEGATIVE Final   Staphylococcus aureus NEGATIVE NEGATIVE Final    Comment: (NOTE) The Xpert SA Assay (FDA approved for NASAL specimens in patients 48 years of age and older), is one component of a comprehensive surveillance program. It is not intended to diagnose infection nor to guide or monitor treatment. Performed at [facility], [address], [place], [place] [address]       Radiology Studies: DG Wrist Complete Left Result Date: 04/27/2024 CLINICAL DATA:  Postreduction EXAM: LEFT WRIST - COMPLETE 3+ VIEW COMPARISON:  Earlier today FINDINGS: In cast views of the left wrist demonstrate distal left radial and ulnar fractures. Continued mild posterior displacement of the distal fracture fragments, but alignment is improved since prior study. IMPRESSION: Improved alignment with continued mild posterior displacement. Electronically Signed   By: Janeece Mechanic M.D.   On: 04/27/2024 20:16   DG Elbow Complete Left Result Date: 04/27/2024 CLINICAL DATA:  Fall, landed on left side EXAM: LEFT ELBOW - COMPLETE 3+ VIEW COMPARISON:  None Available. FINDINGS: No acute bony abnormality. Specifically, no fracture, subluxation, or dislocation. No joint effusion. IMPRESSION: No acute bony abnormality. Electronically Signed   By: Janeece Mechanic M.D.   On: 04/27/2024 20:15   DG FEMUR MIN 2 VIEWS LEFT Result Date: 04/27/2024 CLINICAL DATA:  Left femur fracture EXAM: LEFT FEMUR 2 VIEWS COMPARISON:  None Available. FINDINGS: There is a left femoral intertrochanteric fracture. Varus angulation. No subluxation or dislocation. No additional femoral fracture. No joint effusion in the left knee. IMPRESSION: Angulated left femoral intertrochanteric fracture. Electronically Signed   By: Janeece Mechanic M.D.   On:  04/27/2024 20:14   CT Head Wo Contrast Result Date: 04/27/2024 CLINICAL DATA:  Head trauma EXAM: CT HEAD WITHOUT CONTRAST CT CERVICAL SPINE WITHOUT CONTRAST TECHNIQUE: Multidetector CT imaging of the head and cervical spine was performed following the standard protocol without intravenous contrast. Multiplanar CT image reconstructions of the cervical spine were also generated. RADIATION DOSE REDUCTION: This exam was performed according to the departmental dose-optimization program which includes automated exposure control, adjustment of the mA and/or kV according to patient size and/or use of iterative reconstruction technique. COMPARISON:  CT head 07/07/2021. FINDINGS: CT HEAD FINDINGS Brain: No acute intracranial hemorrhage. No CT evidence of acute infarct. Nonspecific hypoattenuation in the periventricular and subcortical white matter favored to reflect chronic microvascular ischemic changes. Mild parenchymal volume loss. No edema, mass effect, or midline shift. The basilar cisterns are patent. Multiple dural calcifications again noted. Ventricles: The ventricles are normal. Vascular: Atherosclerotic  calcifications of the carotid siphons. No hyperdense vessel. Skull: No acute or aggressive finding. Degenerative changes of the mandibular condyles, greater on the right. Orbits: Orbits are symmetric. Sinuses: Mild mucosal thickening in the ethmoid sinuses. Other: Mastoid air cells are clear. CT CERVICAL SPINE FINDINGS Alignment: Normal. Skull base and vertebrae: No acute fracture. No primary bone lesion or focal pathologic process. Soft tissues and spinal canal: No prevertebral fluid or swelling. No visible canal hematoma. Prominence of the right thyroid  lobe with multiple nodules noted. Disc levels: Intervertebral disc space narrowing most pronounced posteriorly at C3-4 with additional mild disc space narrowing at C5-6 and C6-7. Small disc bulges at multiple levels. No high-grade osseous spinal canal stenosis.  Facet arthrosis and uncovertebral hypertrophy throughout the cervical spine with foraminal narrowing most pronounced on the right at C4-5 and bilaterally at C5-6. Upper chest: Septal thickening in the lung apices which could reflect mild pulmonary edema. Other: None. IMPRESSION: No CT evidence of acute intracranial abnormality. No acute fracture or traumatic malalignment of the cervical spine. Degenerative changes of the cervical spine as above. Prominence of the right thyroid  lobe with heterogeneous appearance and multiple nodules. Consider correlation with nonemergent thyroid  ultrasound. Septal thickening in the lung apices concerning for pulmonary edema. Electronically Signed   By: Denny Flack M.D.   On: 04/27/2024 18:40   CT Cervical Spine Wo Contrast Result Date: 04/27/2024 CLINICAL DATA:  Head trauma EXAM: CT HEAD WITHOUT CONTRAST CT CERVICAL SPINE WITHOUT CONTRAST TECHNIQUE: Multidetector CT imaging of the head and cervical spine was performed following the standard protocol without intravenous contrast. Multiplanar CT image reconstructions of the cervical spine were also generated. RADIATION DOSE REDUCTION: This exam was performed according to the departmental dose-optimization program which includes automated exposure control, adjustment of the mA and/or kV according to patient size and/or use of iterative reconstruction technique. COMPARISON:  CT head 07/07/2021. FINDINGS: CT HEAD FINDINGS Brain: No acute intracranial hemorrhage. No CT evidence of acute infarct. Nonspecific hypoattenuation in the periventricular and subcortical white matter favored to reflect chronic microvascular ischemic changes. Mild parenchymal volume loss. No edema, mass effect, or midline shift. The basilar cisterns are patent. Multiple dural calcifications again noted. Ventricles: The ventricles are normal. Vascular: Atherosclerotic calcifications of the carotid siphons. No hyperdense vessel. Skull: No acute or aggressive  finding. Degenerative changes of the mandibular condyles, greater on the right. Orbits: Orbits are symmetric. Sinuses: Mild mucosal thickening in the ethmoid sinuses. Other: Mastoid air cells are clear. CT CERVICAL SPINE FINDINGS Alignment: Normal. Skull base and vertebrae: No acute fracture. No primary bone lesion or focal pathologic process. Soft tissues and spinal canal: No prevertebral fluid or swelling. No visible canal hematoma. Prominence of the right thyroid  lobe with multiple nodules noted. Disc levels: Intervertebral disc space narrowing most pronounced posteriorly at C3-4 with additional mild disc space narrowing at C5-6 and C6-7. Small disc bulges at multiple levels. No high-grade osseous spinal canal stenosis. Facet arthrosis and uncovertebral hypertrophy throughout the cervical spine with foraminal narrowing most pronounced on the right at C4-5 and bilaterally at C5-6. Upper chest: Septal thickening in the lung apices which could reflect mild pulmonary edema. Other: None. IMPRESSION: No CT evidence of acute intracranial abnormality. No acute fracture or traumatic malalignment of the cervical spine. Degenerative changes of the cervical spine as above. Prominence of the right thyroid  lobe with heterogeneous appearance and multiple nodules. Consider correlation with nonemergent thyroid  ultrasound. Septal thickening in the lung apices concerning for pulmonary edema. Electronically Signed   By:  Denny Flack M.D.   On: 04/27/2024 18:40   DG Hip Unilat W or Wo Pelvis 2-3 Views Left Result Date: 04/27/2024 CLINICAL DATA:  Left hip pain after fall today. EXAM: DG HIP (WITH OR WITHOUT PELVIS) 2-3V LEFT COMPARISON:  None Available. FINDINGS: Moderately displaced proximal left femoral neck fracture is noted. Right hip is unremarkable. IMPRESSION: Moderately displaced proximal left femoral neck fracture. Electronically Signed   By: Rosalene Colon M.D.   On: 04/27/2024 18:06   DG Wrist Complete Left Result  Date: 04/27/2024 CLINICAL DATA:  Left wrist pain after fall. EXAM: LEFT WRIST - COMPLETE 3+ VIEW COMPARISON:  None Available. FINDINGS: Moderately displaced distal left radial fracture is noted with posterior displacement of distal fragments. Minimally displaced distal left ulnar fracture is noted. Moderate degenerative changes seen involving first carpometacarpal joint. IMPRESSION: Displaced distal left radial and ulnar fractures as noted above. Electronically Signed   By: Rosalene Colon M.D.   On: 04/27/2024 18:05   DG Chest Port 1 View Result Date: 04/27/2024 CLINICAL DATA:  Fall. EXAM: PORTABLE CHEST 1 VIEW COMPARISON:  June 14, 2021. FINDINGS: Mild cardiomegaly with mild central pulmonary vascular congestion is noted. No consolidative process is noted. The visualized skeletal structures are unremarkable. IMPRESSION: Mild cardiomegaly with mild central pulmonary vascular congestion. Electronically Signed   By: Rosalene Colon M.D.   On: 04/27/2024 18:02       LOS: 1 day   [employer]  [facility] Pager on [email or web address]  04/28/2024, 11:16 AM

## 2024-04-28 NOTE — H&P (View-Only) (Signed)
 Reason for Consult:Left hip fx Referring Physician: Maylene Spear Time called: 1610 Time at bedside: 0853   Monica Curry is an 89 y.o. female.  HPI: Monica Curry was painting some furniture when she tripped over something and fell. She had immediate left wrist and hip pain and could not get up. She was brought to the ED where x-rays showed fxs of both and orthopedic and hand surgery were consulted. She lives at home alone, does not use any assistive devices, and is RHD.  Past Medical History:  Diagnosis Date   Allergy    Medications and pollen   Anemia    Anxiety    Always   C. difficile colitis    Cancer (HCC)    Endometrial cancer   Cataract    Had surgery 2021   Chronic kidney disease 2018   2 siblings had kidney failure, sister had transplant   Clotting disorder (HCC)    Essential and other specified forms of tremor    Hyperlipidemia    Hypertension    Osteoporosis 11/2019   T score -2.8 stable from prior DEXA   Personal history of venous thrombosis and embolism 1997   Pulmonary embolism (HCC) 06/14/2021   Pulmonary embolus (HCC)    Shingles    Stroke (HCC) 2018   TIA   Thrush    TIA (transient ischemic attack)    carotid US  07/2022: no ICA stenosis bilaterally    Past Surgical History:  Procedure Laterality Date   ABDOMINAL HYSTERECTOMY  1997   APPENDECTOMY  1961   cataract surg Bilateral 2021   Dr. Gennie Kicks, R eye 8/18, L 8/25   CESAREAN SECTION  1961   EYE SURGERY  2021   Cataract both eyes   TONSILLECTOMY AND ADENOIDECTOMY  1944   TOTAL ABDOMINAL HYSTERECTOMY W/ BILATERAL SALPINGOOPHORECTOMY  1997    Family History  Problem Relation Age of Onset   Hypertension Mother    Cancer Mother        Unknown type   Dementia Mother    Varicose Veins Mother    Cancer Father        Bone   Heart disease Sister    Blindness Sister 44       temporal arteritis   Skin cancer Sister        basal and melanoma   Heart disease Sister    Kidney failure Sister         Kidney transplant   Diabetes Sister    Hypertension Sister    Kidney disease Sister    Vision loss Sister    Varicose Veins Sister    Heart disease Brother    Prostate cancer Brother        Prostate   Heart disease Brother    Kidney failure Brother    Prostate cancer Brother        Prostate   Heart disease Brother    Cancer Brother    Kidney disease Brother    Vision loss Brother    Tremor Neg Hx    Parkinson's disease Neg Hx     Social History:  reports that she has never smoked. She has never used smokeless tobacco. She reports that she does not currently use alcohol . She reports that she does not currently use drugs after having used the following drugs: Benzodiazepines.  Allergies:  Allergies  Allergen Reactions   Codeine Hives   Other Other (See Comments)    Reaction: Fever (intolerance)   Cortisone  Causes jitteriness.   Dexamethasone      anxiety   Hctz [Hydrochlorothiazide ] Other (See Comments)    Low sodium   Meloxicam  Nausea Only    Gastritis and nausea   Mysoline [Primidone]    Pneumovax 23 [Pneumococcal Vac Polyvalent]     Red, raised area.  Knot at site of injection   Prednisone      Anxiety   Topiramate      Sleepiness, numbness feet tingling in arm.   Toradol  [Ketorolac  Tromethamine ] Swelling    Localized redness at injection site.     Ace Inhibitors Cough    Medications: I have reviewed the patient's current medications.  Results for orders placed or performed during the hospital encounter of 04/27/24 (from the past 48 hours)  I-stat chem 8, ed     Status: Abnormal   Collection Time: 04/27/24  5:09 PM  Result Value Ref Range   Sodium 139 135 - 145 mmol/L   Potassium 4.5 3.5 - 5.1 mmol/L   Chloride 101 98 - 111 mmol/L   BUN 29 (H) 8 - 23 mg/dL   Creatinine, Ser 6.04 (H) 0.44 - 1.00 mg/dL   Glucose, Bld 540 (H) 70 - 99 mg/dL    Comment: Glucose reference range applies only to samples taken after fasting for at least 8 hours.   Calcium , Ion  1.24 1.15 - 1.40 mmol/L   TCO2 25 22 - 32 mmol/L   Hemoglobin 17.0 (H) 12.0 - 15.0 g/dL   HCT 98.1 (H) 19.1 - 47.8 %  I-Stat CG4 Lactic Acid, ED     Status: None   Collection Time: 04/27/24  5:09 PM  Result Value Ref Range   Lactic Acid, Venous 1.3 0.5 - 1.9 mmol/L  Type and screen [facility]     Status: None   Collection Time: 04/27/24  5:12 PM  Result Value Ref Range   ABO/RH(D) A NEG    Antibody Screen NEG    Sample Expiration      04/30/2024,2359 Performed at [facility], [address] [place], [place] [address]   Basic metabolic panel     Status: Abnormal   Collection Time: 04/27/24  5:24 PM  Result Value Ref Range   Sodium 136 135 - 145 mmol/L   Potassium 4.7 3.5 - 5.1 mmol/L   Chloride 103 98 - 111 mmol/L   CO2 26 22 - 32 mmol/L   Glucose, Bld 117 (H) 70 - 99 mg/dL    Comment: Glucose reference range applies only to samples taken after fasting for at least 8 hours.   BUN 24 (H) 8 - 23 mg/dL   Creatinine, Ser 1.30 (H) 0.44 - 1.00 mg/dL   Calcium  9.3 8.9 - 10.3 mg/dL   GFR, Estimated 40 (L) >60 mL/min    Comment: (NOTE) Calculated using the CKD-EPI Creatinine Equation (2021)    Anion gap 7 5 - 15    Comment: Performed at [facility], [address], [place], [place] [address]  CBC with Differential     Status: Abnormal   Collection Time: 04/27/24  5:24 PM  Result Value Ref Range   WBC 8.8 4.0 - 10.5 K/uL   RBC 5.39 (H) 3.87 - 5.11 MIL/uL   Hemoglobin 15.2 (H) 12.0 - 15.0 g/dL   HCT 46.9 (H) 62.9 - 52.8 %   MCV 87.0 80.0 - 100.0 fL   MCH 28.2 26.0 - 34.0 pg   MCHC 32.4 30.0 - 36.0 g/dL  RDW 14.4 11.5 - 15.5 %   Platelets 503 (H) 150 - 400 K/uL   nRBC 0.0 0.0 - 0.2 %   Neutrophils Relative % 60 %   Neutro Abs 5.4 1.7 - 7.7 K/uL   Lymphocytes Relative 29 %   Lymphs Abs 2.5 0.7 - 4.0 K/uL   Monocytes Relative 7 %   Monocytes Absolute 0.6 0.1 - 1.0 K/uL   Eosinophils Relative 2 %   Eosinophils Absolute 0.2 0.0 - 0.5 K/uL    Basophils Relative 1 %   Basophils Absolute 0.1 0.0 - 0.1 K/uL   Immature Granulocytes 1 %   Abs Immature Granulocytes 0.04 0.00 - 0.07 K/uL    Comment: Performed at [facility], [address], [place], [place] [address]  Protime-INR     Status: Abnormal   Collection Time: 04/27/24  5:24 PM  Result Value Ref Range   Prothrombin Time 19.0 (H) 11.4 - 15.2 seconds   INR 1.6 (H) 0.8 - 1.2    Comment: (NOTE) INR goal varies based on device and disease states. Performed at [facility], [address], [place], [place] [address]   ABO/Rh     Status: None   Collection Time: 04/27/24  5:27 PM  Result Value Ref Range   ABO/RH(D)      A NEG Performed at [facility], [address] [place], [place] [address]   Surgical pcr screen     Status: None   Collection Time: 04/27/24  8:41 PM   Specimen: Nasal Mucosa; Nasal Swab  Result Value Ref Range   MRSA, PCR NEGATIVE NEGATIVE   Staphylococcus aureus NEGATIVE NEGATIVE    Comment: (NOTE) The Xpert SA Assay (FDA approved for NASAL specimens in patients 53 years of age and older), is one component of a comprehensive surveillance program. It is not intended to diagnose infection nor to guide or monitor treatment. Performed at [facility], [address], [place], [place] [address]     DG Wrist Complete Left Result Date: 04/27/2024 CLINICAL DATA:  Postreduction EXAM: LEFT WRIST - COMPLETE 3+ VIEW COMPARISON:  Earlier today FINDINGS: In cast views of the left wrist demonstrate distal left radial and ulnar fractures. Continued mild posterior displacement of the distal fracture fragments, but alignment is improved since prior study. IMPRESSION: Improved alignment with continued mild posterior displacement. Electronically Signed   By: Janeece Mechanic M.D.   On: 04/27/2024 20:16   DG Elbow Complete Left Result Date: 04/27/2024 CLINICAL DATA:  Fall, landed on left side EXAM: LEFT ELBOW - COMPLETE 3+ VIEW COMPARISON:  None  Available. FINDINGS: No acute bony abnormality. Specifically, no fracture, subluxation, or dislocation. No joint effusion. IMPRESSION: No acute bony abnormality. Electronically Signed   By: Janeece Mechanic M.D.   On: 04/27/2024 20:15   DG FEMUR MIN 2 VIEWS LEFT Result Date: 04/27/2024 CLINICAL DATA:  Left femur fracture EXAM: LEFT FEMUR 2 VIEWS COMPARISON:  None Available. FINDINGS: There is a left femoral intertrochanteric fracture. Varus angulation. No subluxation or dislocation. No additional femoral fracture. No joint effusion in the left knee. IMPRESSION: Angulated left femoral intertrochanteric fracture. Electronically Signed   By: Janeece Mechanic M.D.   On: 04/27/2024 20:14   CT Head Wo Contrast Result Date: 04/27/2024 CLINICAL DATA:  Head trauma EXAM: CT HEAD WITHOUT CONTRAST CT CERVICAL SPINE WITHOUT CONTRAST TECHNIQUE: Multidetector CT imaging of the head and cervical spine was performed following the standard protocol without intravenous contrast. Multiplanar CT  image reconstructions of the cervical spine were also generated. RADIATION DOSE REDUCTION: This exam was performed according to the departmental dose-optimization program which includes automated exposure control, adjustment of the mA and/or kV according to patient size and/or use of iterative reconstruction technique. COMPARISON:  CT head 07/07/2021. FINDINGS: CT HEAD FINDINGS Brain: No acute intracranial hemorrhage. No CT evidence of acute infarct. Nonspecific hypoattenuation in the periventricular and subcortical white matter favored to reflect chronic microvascular ischemic changes. Mild parenchymal volume loss. No edema, mass effect, or midline shift. The basilar cisterns are patent. Multiple dural calcifications again noted. Ventricles: The ventricles are normal. Vascular: Atherosclerotic calcifications of the carotid siphons. No hyperdense vessel. Skull: No acute or aggressive finding. Degenerative changes of the mandibular condyles,  greater on the right. Orbits: Orbits are symmetric. Sinuses: Mild mucosal thickening in the ethmoid sinuses. Other: Mastoid air cells are clear. CT CERVICAL SPINE FINDINGS Alignment: Normal. Skull base and vertebrae: No acute fracture. No primary bone lesion or focal pathologic process. Soft tissues and spinal canal: No prevertebral fluid or swelling. No visible canal hematoma. Prominence of the right thyroid  lobe with multiple nodules noted. Disc levels: Intervertebral disc space narrowing most pronounced posteriorly at C3-4 with additional mild disc space narrowing at C5-6 and C6-7. Small disc bulges at multiple levels. No high-grade osseous spinal canal stenosis. Facet arthrosis and uncovertebral hypertrophy throughout the cervical spine with foraminal narrowing most pronounced on the right at C4-5 and bilaterally at C5-6. Upper chest: Septal thickening in the lung apices which could reflect mild pulmonary edema. Other: None. IMPRESSION: No CT evidence of acute intracranial abnormality. No acute fracture or traumatic malalignment of the cervical spine. Degenerative changes of the cervical spine as above. Prominence of the right thyroid  lobe with heterogeneous appearance and multiple nodules. Consider correlation with nonemergent thyroid  ultrasound. Septal thickening in the lung apices concerning for pulmonary edema. Electronically Signed   By: Denny Flack M.D.   On: 04/27/2024 18:40   CT Cervical Spine Wo Contrast Result Date: 04/27/2024 CLINICAL DATA:  Head trauma EXAM: CT HEAD WITHOUT CONTRAST CT CERVICAL SPINE WITHOUT CONTRAST TECHNIQUE: Multidetector CT imaging of the head and cervical spine was performed following the standard protocol without intravenous contrast. Multiplanar CT image reconstructions of the cervical spine were also generated. RADIATION DOSE REDUCTION: This exam was performed according to the departmental dose-optimization program which includes automated exposure control, adjustment of  the mA and/or kV according to patient size and/or use of iterative reconstruction technique. COMPARISON:  CT head 07/07/2021. FINDINGS: CT HEAD FINDINGS Brain: No acute intracranial hemorrhage. No CT evidence of acute infarct. Nonspecific hypoattenuation in the periventricular and subcortical white matter favored to reflect chronic microvascular ischemic changes. Mild parenchymal volume loss. No edema, mass effect, or midline shift. The basilar cisterns are patent. Multiple dural calcifications again noted. Ventricles: The ventricles are normal. Vascular: Atherosclerotic calcifications of the carotid siphons. No hyperdense vessel. Skull: No acute or aggressive finding. Degenerative changes of the mandibular condyles, greater on the right. Orbits: Orbits are symmetric. Sinuses: Mild mucosal thickening in the ethmoid sinuses. Other: Mastoid air cells are clear. CT CERVICAL SPINE FINDINGS Alignment: Normal. Skull base and vertebrae: No acute fracture. No primary bone lesion or focal pathologic process. Soft tissues and spinal canal: No prevertebral fluid or swelling. No visible canal hematoma. Prominence of the right thyroid  lobe with multiple nodules noted. Disc levels: Intervertebral disc space narrowing most pronounced posteriorly at C3-4 with additional mild disc space narrowing at C5-6 and C6-7. Small disc bulges  at multiple levels. No high-grade osseous spinal canal stenosis. Facet arthrosis and uncovertebral hypertrophy throughout the cervical spine with foraminal narrowing most pronounced on the right at C4-5 and bilaterally at C5-6. Upper chest: Septal thickening in the lung apices which could reflect mild pulmonary edema. Other: None. IMPRESSION: No CT evidence of acute intracranial abnormality. No acute fracture or traumatic malalignment of the cervical spine. Degenerative changes of the cervical spine as above. Prominence of the right thyroid  lobe with heterogeneous appearance and multiple nodules. Consider  correlation with nonemergent thyroid  ultrasound. Septal thickening in the lung apices concerning for pulmonary edema. Electronically Signed   By: Denny Flack M.D.   On: 04/27/2024 18:40   DG Hip Unilat W or Wo Pelvis 2-3 Views Left Result Date: 04/27/2024 CLINICAL DATA:  Left hip pain after fall today. EXAM: DG HIP (WITH OR WITHOUT PELVIS) 2-3V LEFT COMPARISON:  None Available. FINDINGS: Moderately displaced proximal left femoral neck fracture is noted. Right hip is unremarkable. IMPRESSION: Moderately displaced proximal left femoral neck fracture. Electronically Signed   By: Rosalene Colon M.D.   On: 04/27/2024 18:06   DG Wrist Complete Left Result Date: 04/27/2024 CLINICAL DATA:  Left wrist pain after fall. EXAM: LEFT WRIST - COMPLETE 3+ VIEW COMPARISON:  None Available. FINDINGS: Moderately displaced distal left radial fracture is noted with posterior displacement of distal fragments. Minimally displaced distal left ulnar fracture is noted. Moderate degenerative changes seen involving first carpometacarpal joint. IMPRESSION: Displaced distal left radial and ulnar fractures as noted above. Electronically Signed   By: Rosalene Colon M.D.   On: 04/27/2024 18:05   DG Chest Port 1 View Result Date: 04/27/2024 CLINICAL DATA:  Fall. EXAM: PORTABLE CHEST 1 VIEW COMPARISON:  June 14, 2021. FINDINGS: Mild cardiomegaly with mild central pulmonary vascular congestion is noted. No consolidative process is noted. The visualized skeletal structures are unremarkable. IMPRESSION: Mild cardiomegaly with mild central pulmonary vascular congestion. Electronically Signed   By: Rosalene Colon M.D.   On: 04/27/2024 18:02    Review of Systems  HENT:  Negative for ear discharge, ear pain, hearing loss and tinnitus.   Eyes:  Negative for photophobia and pain.  Respiratory:  Negative for cough and shortness of breath.   Cardiovascular:  Negative for chest pain.  Gastrointestinal:  Negative for abdominal pain, nausea  and vomiting.  Genitourinary:  Negative for dysuria, flank pain, frequency and urgency.  Musculoskeletal:  Positive for arthralgias (Left hip and wrist). Negative for back pain, myalgias and neck pain.  Neurological:  Negative for dizziness and headaches.  Hematological:  Does not bruise/bleed easily.  Psychiatric/Behavioral:  The patient is not nervous/anxious.    Blood pressure 130/64, pulse 71, temperature 98.5 F (36.9 C), temperature source Oral, resp. rate 17, height 5\' 3"  (1.6 m), weight 63.5 kg, SpO2 91%. Physical Exam Constitutional:      General: She is not in acute distress.    Appearance: She is well-developed. She is not diaphoretic.  HENT:     Head: Normocephalic and atraumatic.  Eyes:     General: No scleral icterus.       Right eye: No discharge.        Left eye: No discharge.     Conjunctiva/sclera: Conjunctivae normal.  Cardiovascular:     Rate and Rhythm: Normal rate and regular rhythm.  Pulmonary:     Effort: Pulmonary effort is normal. No respiratory distress.  Musculoskeletal:     Cervical back: Normal range of motion.  Comments: Left shoulder, elbow, wrist, digits- Splint in place, no instability, no blocks to motion  Sens  Ax/R/M/U intact  Mot   Ax/ R/ PIN/ M/ AIN/ U intact  Fingers perfused  LLE No traumatic wounds, ecchymosis, or rash  Mod TTP hip  No knee or ankle effusion  Knee stable to varus/ valgus and anterior/posterior stress  Sens DPN, SPN, TN intact  Motor EHL, ext, flex, evers 5/5  DP 2+, PT 2+, No significant edema  Skin:    General: Skin is warm and dry.  Neurological:     Mental Status: She is alert.  Psychiatric:        Mood and Affect: Mood normal.        Behavior: Behavior normal.     Assessment/Plan: Left hip fx -- Plan IMN today with Dr. Curtiss Dowdy. Please keep NPO. Left wrist fx -- Plan non-operative management. Dr. Agarwala managing. Multiple medical problems including history of blood clots on lifelong Eliquis , essential  tremor, hypertension, h/o TIA, and anxiety -- per primary service    Georganna Kin, PA-C Orthopedic Surgery [phone] 04/28/2024, 9:01 AM

## 2024-04-28 NOTE — Transfer of Care (Signed)
 Immediate Anesthesia Transfer of Care Note  Patient: Company secretary  Procedure(s) Performed: FIXATION, FRACTURE, INTERTROCHANTERIC, WITH INTRAMEDULLARY ROD (Left)  Patient Location: PACU  Anesthesia Type:General  Level of Consciousness: awake, alert , and patient cooperative  Airway & Oxygen Therapy: Patient Spontanous Breathing and Patient connected to face mask oxygen  Post-op Assessment: Report given to RN, Post -op Vital signs reviewed and stable, Patient moving all extremities, Patient moving all extremities X 4, and Patient able to stick tongue midline  Post vital signs: Reviewed and stable  Last Vitals:  Vitals Value Taken Time  BP 179/61 04/28/24 1724  Temp    Pulse 71 04/28/24 1727  Resp 13 04/28/24 1727  SpO2 100 % 04/28/24 1727  Vitals shown include unfiled device data.  Last Pain:  Vitals:   04/28/24 1450  TempSrc: Oral  PainSc:          Complications: No notable events documented.

## 2024-04-28 NOTE — H&P (Signed)
 History and Physical    PatientJacci Curry ZOX:[id number] DOB: 1931-04-24 DOA: 04/27/2024 DOS: the patient was seen and examined on 04/28/2024 PCP: Christel Cousins, MD  Patient coming from: Home  Chief Complaint:  Chief Complaint  Patient presents with   Fall   HPI: Monica Curry is an independent 89 y.o. 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  She was out working in her garden when she tripped.  The patient says she was in her usual state of health and feeling fine prior to the fall.  In the emergency department, workup revealed a left hip fracture and a left distal radius fracture. CT of head and C-spine were okay. She will admitted to the hospitalist service with her fractures managed by orthopedics and hand services.  Review of Systems: As mentioned in the history of present illness. All other systems reviewed and are negative. Past Medical History:  Diagnosis Date   Allergy    Medications and pollen   Anemia    Anxiety    Always   C. difficile colitis    Cancer [facility])    Endometrial cancer   Cataract    Had surgery 2021   Chronic kidney disease 2018   2 siblings had kidney failure, sister had transplant   Clotting disorder [facility])    Essential and other specified forms of tremor    Hyperlipidemia    Hypertension    Osteoporosis 11/2019   T score -2.8 stable from prior DEXA   Personal history of venous thrombosis and embolism 1997   Pulmonary embolism (HCC) 06/14/2021   Pulmonary embolus (HCC)    Shingles    Stroke (HCC) 2018   TIA   Thrush    TIA (transient ischemic attack)    carotid US  07/2022: no ICA stenosis bilaterally   Past Surgical History:  Procedure Laterality Date   ABDOMINAL HYSTERECTOMY  1997   APPENDECTOMY  1961   cataract surg Bilateral 2021   Dr. Gennie Kicks, R eye 8/18, L 8/25   CESAREAN SECTION  1961   EYE SURGERY  2021   Cataract both  eyes   TONSILLECTOMY AND ADENOIDECTOMY  1944   TOTAL ABDOMINAL HYSTERECTOMY W/ BILATERAL SALPINGOOPHORECTOMY  1997   Social History:  reports that she has never smoked. She has never used smokeless tobacco. She reports that she does not currently use alcohol . She reports that she does not currently use drugs after having used the following drugs: Benzodiazepines.  Allergies  Allergen Reactions   Codeine Hives   Other Other (See Comments)    Reaction: Fever (intolerance)   Cortisone     Causes jitteriness.   Dexamethasone      anxiety   Hctz [Hydrochlorothiazide ] Other (See Comments)    Low sodium   Meloxicam  Nausea Only    Gastritis and nausea   Mysoline [Primidone]    Pneumovax 23 [Pneumococcal Vac Polyvalent]     Red, raised area.  Knot at site of injection   Prednisone      Anxiety   Topiramate      Sleepiness, numbness feet tingling in arm.   Toradol  [Ketorolac  Tromethamine ] Swelling    Localized redness at injection site.     Ace Inhibitors Cough    Family History  Problem Relation Age of Onset   Hypertension Mother    Cancer Mother        Unknown type   Dementia Mother    Varicose Veins  Mother    Cancer Father        Bone   Heart disease Sister    Blindness Sister 19       temporal arteritis   Skin cancer Sister        basal and melanoma   Heart disease Sister    Kidney failure Sister        Kidney transplant   Diabetes Sister    Hypertension Sister    Kidney disease Sister    Vision loss Sister    Varicose Veins Sister    Heart disease Brother    Prostate cancer Brother        Prostate   Heart disease Brother    Kidney failure Brother    Prostate cancer Brother        Prostate   Heart disease Brother    Cancer Brother    Kidney disease Brother    Vision loss Brother    Tremor Neg Hx    Parkinson's disease Neg Hx     Prior to Admission medications   Medication Sig Start Date End Date Taking? Authorizing Provider  acetaminophen  (TYLENOL ) 500 MG  tablet Take 500 mg by mouth every 6 (six) hours as needed for moderate pain.   Yes [provider]  apixaban  (ELIQUIS ) 5 MG TABS tablet Take 1 tablet (5 mg total) by mouth 2 (two) times daily. 04/01/24  Yes Christel Cousins, MD  atorvastatin  (LIPITOR) 10 MG tablet TAKE 1 TABLET BY MOUTH DAILY FOR CHOLESTEROL 04/01/24  Yes Christel Cousins, MD  Calcium  Carb-Cholecalciferol (CALCIUM  PLUS VITAMIN D  PO) Take 1 tablet by mouth daily. 1 chewable daily   Yes [provider]  Cyanocobalamin  500 MCG CHEW Chew 1 tablet by mouth daily.   Yes [provider]  escitalopram  (LEXAPRO ) 5 MG tablet Take 1 tablet (5 mg total) by mouth daily. 04/01/24  Yes Christel Cousins, MD  hydrOXYzine  (ATARAX ) 25 MG tablet Take 1 tablet (25 mg total) by mouth daily. 04/01/24  Yes Christel Cousins, MD  Magnesium  250 MG TABS Take 250 mg by mouth 2 (two) times a day.   Yes [provider]  propranolol  (INDERAL ) 40 MG tablet Take 1 tablet (40 mg total) by mouth 4 (four) times daily. TAKE 1 TABLET BY MOUTH FOUR TIMES DAILY AT BEDTIME WITH MEALS FOR BLOOD PRESSURE OR TREMORS 04/01/24  Yes Christel Cousins, MD  VITAMIN D  PO Take 5,000 Units by mouth daily. 2 tablets daily   Yes [provider]    Physical Exam: Vitals:   04/27/24 1848 04/27/24 2000 04/27/24 2022 04/28/24 0043  BP: (!) 200/80 (!) 151/64 138/81 129/62  Pulse:  77 78 72  Resp:  (!) 21 20 16   Temp:  98.2 F (36.8 C) 98.6 F (37 C) 98 F (36.7 C)  TempSrc:  Oral Oral   SpO2:  98% 98% 91%  Weight:      Height:       Physical Exam:  General: No acute distress, well developed, well nourished HEENT: Normocephalic, atraumatic, Pupils surgically irregular Cardiovascular: Normal rate and rhythm. SM, pedal pulses not found even by doppler on left but skin warm and dry. Pulmonary: Normal pulmonary effort, normal breath sounds Gastrointestinal: Distended abdomen, soft, non-tender, normoactive bowel sounds Musculoskeletal:lower  ext edema on left Skin: Skin is warm and dry. Neuro: Strength testing deferred because of the 2 fractures, AAOx3. PSYCH: Attentive and cooperative  Data Reviewed:  Results for orders placed or performed  during the hospital encounter of 04/27/24 (from the past 24 hours)  I-stat chem 8, ed     Status: Abnormal   Collection Time: 04/27/24  5:09 PM  Result Value Ref Range   Sodium 139 135 - 145 mmol/L   Potassium 4.5 3.5 - 5.1 mmol/L   Chloride 101 98 - 111 mmol/L   BUN 29 (H) 8 - 23 mg/dL   Creatinine, Ser 4.54 (H) 0.44 - 1.00 mg/dL   Glucose, Bld 098 (H) 70 - 99 mg/dL   Calcium , Ion 1.24 1.15 - 1.40 mmol/L   TCO2 25 22 - 32 mmol/L   Hemoglobin 17.0 (H) 12.0 - 15.0 g/dL   HCT 11.9 (H) 14.7 - 82.9 %  I-Stat CG4 Lactic Acid, ED     Status: None   Collection Time: 04/27/24  5:09 PM  Result Value Ref Range   Lactic Acid, Venous 1.3 0.5 - 1.9 mmol/L  Type and screen [facility]     Status: None   Collection Time: 04/27/24  5:12 PM  Result Value Ref Range   ABO/RH(D) A NEG    Antibody Screen NEG    Sample Expiration      04/30/2024,2359 Performed at [facility], [address] [place], [place] [address]   Basic metabolic panel     Status: Abnormal   Collection Time: 04/27/24  5:24 PM  Result Value Ref Range   Sodium 136 135 - 145 mmol/L   Potassium 4.7 3.5 - 5.1 mmol/L   Chloride 103 98 - 111 mmol/L   CO2 26 22 - 32 mmol/L   Glucose, Bld 117 (H) 70 - 99 mg/dL   BUN 24 (H) 8 - 23 mg/dL   Creatinine, Ser 0.86 (H) 0.44 - 1.00 mg/dL   Calcium  9.3 8.9 - 10.3 mg/dL   GFR, Estimated 40 (L) >60 mL/min   Anion gap 7 5 - 15  CBC with Differential     Status: Abnormal   Collection Time: 04/27/24  5:24 PM  Result Value Ref Range   WBC 8.8 4.0 - 10.5 K/uL   RBC 5.39 (H) 3.87 - 5.11 MIL/uL   Hemoglobin 15.2 (H) 12.0 - 15.0 g/dL   HCT 57.8 (H) 46.9 - 62.9 %   MCV 87.0 80.0 - 100.0 fL   MCH 28.2 26.0 - 34.0 pg   MCHC 32.4 30.0 - 36.0 g/dL   RDW 52.8 41.3 -  24.4 %   Platelets 503 (H) 150 - 400 K/uL   nRBC 0.0 0.0 - 0.2 %   Neutrophils Relative % 60 %   Neutro Abs 5.4 1.7 - 7.7 K/uL   Lymphocytes Relative 29 %   Lymphs Abs 2.5 0.7 - 4.0 K/uL   Monocytes Relative 7 %   Monocytes Absolute 0.6 0.1 - 1.0 K/uL   Eosinophils Relative 2 %   Eosinophils Absolute 0.2 0.0 - 0.5 K/uL   Basophils Relative 1 %   Basophils Absolute 0.1 0.0 - 0.1 K/uL   Immature Granulocytes 1 %   Abs Immature Granulocytes 0.04 0.00 - 0.07 K/uL  Protime-INR     Status: Abnormal   Collection Time: 04/27/24  5:24 PM  Result Value Ref Range   Prothrombin Time 19.0 (H) 11.4 - 15.2 seconds   INR 1.6 (H) 0.8 - 1.2  ABO/Rh     Status: None   Collection Time: 04/27/24  5:27 PM  Result Value Ref Range   ABO/RH(D)      A NEG Performed  at [facility], [address] [place], [place] [address]   Surgical pcr screen     Status: None   Collection Time: 04/27/24  8:41 PM   Specimen: Nasal Mucosa; Nasal Swab  Result Value Ref Range   MRSA, PCR NEGATIVE NEGATIVE   Staphylococcus aureus NEGATIVE NEGATIVE     Assessment and Plan: Left hip fracture and left distal radial fracture - NPO after midnight - Patient will need surgical repair of her hip fracture. Appreciate management by orthopedic surgery.   - She has a soft cast on her left wrist.  It is unclear if she will need surgical repair for the wrist.  Appreciate management per hand surgery - pain control  2. Hypertension, uncontrolled - Likely in part uncontrolled because of pain - Resume labetalol which she takes in part because of tremor.  3. H/o PE - Eliquis  held pending surgery in am    Advance Care Planning:   Code Status: Prior  The patient wants to be full code and names her daughter as her Social research officer, government.  Consults: Hand surgery and orthopedic surgery  Family Communication: none  Severity of Illness: The appropriate patient status for this patient is INPATIENT. Inpatient status is  judged to be reasonable and necessary in order to provide the required intensity of service to ensure the patient's safety. The patient's presenting symptoms, physical exam findings, and initial radiographic and laboratory data in the context of their chronic comorbidities is felt to place them at high risk for further clinical deterioration. Furthermore, it is not anticipated that the patient will be medically stable for discharge from the hospital within 2 midnights of admission.   * I certify that at the point of admission it is my clinical judgment that the patient will require inpatient hospital care spanning beyond 2 midnights from the point of admission due to high intensity of service, high risk for further deterioration and high frequency of surveillance required.*  Author: Willadean Hark, MD 04/28/2024 2:21 AM  For on call review [email or web address].

## 2024-04-28 NOTE — Anesthesia Preprocedure Evaluation (Addendum)
 Anesthesia Evaluation  Patient identified by MRN, date of birth, ID band Patient awake    Reviewed: Allergy & Precautions, NPO status , Patient's Chart, lab work & pertinent test results  Airway Mallampati: II  TM Distance: >3 FB Neck ROM: Full    Dental no notable dental hx.    Pulmonary neg pulmonary ROS   Pulmonary exam normal        Cardiovascular hypertension, + dysrhythmias  Rhythm:Regular Rate:Normal     Neuro/Psych   Anxiety     CVA, No Residual Symptoms    GI/Hepatic negative GI ROS, Neg liver ROS,,,  Endo/Other  negative endocrine ROS    Renal/GU   negative genitourinary   Musculoskeletal Hip fracture    Abdominal Normal abdominal exam  (+)   Peds  Hematology  (+) Blood dyscrasia, anemia   Anesthesia Other Findings   Reproductive/Obstetrics                             Anesthesia Physical Anesthesia Plan  ASA: 3  Anesthesia Plan: General   Post-op Pain Management:    Induction: Intravenous  PONV Risk Score and Plan: 3 and Ondansetron, Dexamethasone  and Treatment may vary due to age or medical condition  Airway Management Planned: Mask and Oral ETT  Additional Equipment: None  Intra-op Plan:   Post-operative Plan: Extubation in OR  Informed Consent: I have reviewed the patients History and Physical, chart, labs and discussed the procedure including the risks, benefits and alternatives for the proposed anesthesia with the patient or authorized representative who has indicated his/her understanding and acceptance.     Dental advisory given  Plan Discussed with: CRNA  Anesthesia Plan Comments:        Anesthesia Quick Evaluation

## 2024-04-29 DIAGNOSIS — I10 Essential (primary) hypertension: Secondary | ICD-10-CM | POA: Diagnosis not present

## 2024-04-29 DIAGNOSIS — N179 Acute kidney failure, unspecified: Secondary | ICD-10-CM

## 2024-04-29 LAB — BASIC METABOLIC PANEL WITH GFR
Anion gap: 12 (ref 5–15)
BUN: 30 mg/dL — ABNORMAL HIGH (ref 8–23)
CO2: 22 mmol/L (ref 22–32)
Calcium: 8.4 mg/dL — ABNORMAL LOW (ref 8.9–10.3)
Chloride: 98 mmol/L (ref 98–111)
Creatinine, Ser: 1.68 mg/dL — ABNORMAL HIGH (ref 0.44–1.00)
GFR, Estimated: 29 mL/min — ABNORMAL LOW (ref >60)
Glucose, Bld: 228 mg/dL — ABNORMAL HIGH (ref 70–99)
Potassium: 4.6 mmol/L (ref 3.5–5.1)
Sodium: 132 mmol/L — ABNORMAL LOW (ref 135–145)

## 2024-04-29 LAB — CBC
HCT: 40.5 % (ref 36.0–46.0)
Hemoglobin: 13.5 g/dL (ref 12.0–15.0)
MCH: 29 pg (ref 26.0–34.0)
MCHC: 33.3 g/dL (ref 30.0–36.0)
MCV: 86.9 fL (ref 80.0–100.0)
Platelets: 363 10*3/uL (ref 150–400)
RBC: 4.66 MIL/uL (ref 3.87–5.11)
RDW: 14.6 % (ref 11.5–15.5)
WBC: 11 10*3/uL — ABNORMAL HIGH (ref 4.0–10.5)
nRBC: 0 % (ref 0.0–0.2)

## 2024-04-29 MED ORDER — APIXABAN 5 MG PO TABS
5.0000 mg | ORAL_TABLET | Freq: Two times a day (BID) | ORAL | Status: DC
  Administered 2024-04-29 – 2024-05-04 (×10): 5 mg via ORAL
  Filled 2024-04-29 (×10): qty 1

## 2024-04-29 MED ORDER — LACTATED RINGERS IV SOLN: INTRAVENOUS | Status: AC

## 2024-04-29 MED ORDER — APIXABAN 2.5 MG PO TABS: 2.5000 mg | ORAL_TABLET | Freq: Two times a day (BID) | ORAL | Status: DC

## 2024-04-29 NOTE — Evaluation (Signed)
 Occupational Therapy Evaluation Patient Details Name: Monica Curry MRN: [id number] DOB: December 10, 1930 Today's Date: 04/29/2024   History of Present Illness   Patient is a 89 year old with mechanical fall resulting in left hip fracture and left distal radius fracture. S/p ORIF of L hip and non-operative management of LUE. PMH: Anemia, c-diff, CKD, HTN, osteoporosis, PE, TIA.     Clinical Impressions Pt admitted based on above, and was seen based on problem list below. PTA pt was living alone and was independent with ADLs and IADLs. Today pt is requiring set up  to max +2   assist for ADLs. Bed mobility and functional transfers are  max +2 . Noted edema in L index finger and thumb. Repositioned and encouraged pt to keep LUE elevated. Yellow foam squeeze provided to pt, encouraged to complete 10 reps to decrease edema and improve AROM for finger flexion. Recommendation of <3 hours of skilled rehab daily. OT will continue to follow acutely to maximize functional independence.        If plan is discharge home, recommend the following:   Two people to help with walking and/or transfers;A lot of help with bathing/dressing/bathroom     Functional Status Assessment   Patient has had a recent decline in their functional status and demonstrates the ability to make significant improvements in function in a reasonable and predictable amount of time.     Equipment Recommendations   Other (comment) (Defer to next venue)     Recommendations for Other Services         Precautions/Restrictions   Precautions Precautions: Fall Recall of Precautions/Restrictions: Intact Required Braces or Orthoses: Splint/Cast Splint/Cast: LUE sugartong splint Restrictions Weight Bearing Restrictions Per Provider Order: Yes LUE Weight Bearing Per Provider Order: Non weight bearing LLE Weight Bearing Per Provider Order: Weight bearing as tolerated     Mobility Bed Mobility Overal bed mobility:  Needs Assistance Bed Mobility: Supine to Sit     Supine to sit: Max assist, +2 for physical assistance     General bed mobility comments: assistance for LLE & trunk    Transfers Overall transfer level: Needs assistance Equipment used: 2 person hand held assist Transfers: Sit to/from Stand, Bed to chair/wheelchair/BSC Sit to Stand: Mod assist, +2 physical assistance     Step pivot transfers: Max assist, +2 physical assistance     General transfer comment: Cues for sequencing difficulty stepping, pt prefer to glide foot      Balance Overall balance assessment: Needs assistance Sitting-balance support: Feet supported Sitting balance-Leahy Scale: Fair     Standing balance support: Single extremity supported Standing balance-Leahy Scale: Poor Standing balance comment: reliant on external support           ADL either performed or assessed with clinical judgement   ADL Overall ADL's : Needs assistance/impaired Eating/Feeding: Minimal assistance;Sitting   Grooming: Set up;Sitting   Upper Body Bathing: Minimal assistance;Sitting   Lower Body Bathing: Moderate assistance;+2 for physical assistance;Sit to/from stand Lower Body Bathing Details (indicate cue type and reason): With increased time, pt with difficulty reaching LLE Upper Body Dressing : Minimal assistance;Sitting   Lower Body Dressing: Moderate assistance;+2 for physical assistance;Sit to/from stand Lower Body Dressing Details (indicate cue type and reason): With increased time, pt with difficulty reaching LLE Toilet Transfer: Maximal assistance;+2 for physical assistance;Stand-pivot Toilet Transfer Details (indicate cue type and reason): Simulated in room 2 person [facility] assist         Functional mobility during ADLs: Maximal assistance;+2 for physical  assistance General ADL Comments: Good ROM at shoulder aids pt ability to complete UB tasks     Vision Baseline Vision/History: 0 No visual deficits Vision  Assessment?: No apparent visual deficits            Pertinent Vitals/Pain Pain Assessment Pain Assessment: Faces Faces Pain Scale: Hurts even more Pain Location: L hip Pain Descriptors / Indicators: Pressure Pain Intervention(s): Repositioned     Extremity/Trunk Assessment Upper Extremity Assessment Upper Extremity Assessment: LUE deficits/detail LUE Deficits / Details: L distal radius fx, edema in index and thumb limiting digit flexion, shoulder WFL LUE Sensation: WNL LUE Coordination: decreased fine motor   Lower Extremity Assessment Lower Extremity Assessment: Defer to PT evaluation LLE Deficits / Details: pain with AROM of L hip. 5/5 dorsiflexion and plantarflexion LLE Sensation: WNL   Cervical / Trunk Assessment Cervical / Trunk Assessment: Normal   Communication Communication Communication: No apparent difficulties   Cognition Arousal: Alert Behavior During Therapy: WFL for tasks assessed/performed Cognition: No apparent impairments     Following commands: Impaired Following commands impaired: Follows one step commands with increased time     Cueing  General Comments   Cueing Techniques: Verbal cues  VSS on RA   Exercises Exercises: Hand exercises Hand Exercises Digit Composite Flexion: AROM, Left, 10 reps, Seated, Other (comment) (Yellow foam cube)   Shoulder Instructions      Home Living Family/patient expects to be discharged to:: Private residence Living Arrangements: Alone Available Help at Discharge: Available PRN/intermittently;Family;Friend(s) Type of Home: House Home Access: Stairs to enter [employer] of Steps: 4 Entrance Stairs-Rails: Can reach both Home Layout: Laundry or work area in basement     [employer] Shower/Tub: Producer, television/film/video: Standard Bathroom Accessibility: Yes How Accessible: Accessible via walker Home Equipment: BSC/3in1;Other (comment);Hand held shower head (3 prong cane)           Prior Functioning/Environment Prior Level of Function : Independent/Modified Independent             Mobility Comments: No AD ADLs Comments: independent    OT Problem List: Decreased strength;Decreased range of motion;Decreased activity tolerance;Impaired balance (sitting and/or standing);Impaired UE functional use   OT Treatment/Interventions: Self-care/ADL training;Therapeutic exercise;DME and/or AE instruction;Energy conservation;Therapeutic activities;Patient/family education;Balance training      OT Goals(Current goals can be found in the care plan section)   Acute Rehab OT Goals Patient Stated Goal: to go home OT Goal Formulation: With patient Time For Goal Achievement: 05/13/24 Potential to Achieve Goals: Good   OT Frequency:  Min 2X/week    Co-evaluation PT/OT/SLP Co-Evaluation/Treatment: Yes Reason for Co-Treatment: To address functional/ADL transfers PT goals addressed during session: Mobility/safety with mobility OT goals addressed during session: ADL's and self-care;Strengthening/ROM      AM-PAC OT "6 Clicks" Daily Activity     Outcome Measure Help from another person eating meals?: A Little Help from another person taking care of personal grooming?: A Little Help from another person toileting, which includes using toliet, bedpan, or urinal?: Total Help from another person bathing (including washing, rinsing, drying)?: A Lot Help from another person to put on and taking off regular upper body clothing?: A Little Help from another person to put on and taking off regular lower body clothing?: A Lot 6 Click Score: 14   End of Session Equipment Utilized During Treatment: Gait belt Nurse Communication: Mobility status  Activity Tolerance: Patient tolerated treatment well Patient left: in chair;with call bell/phone within reach;with chair alarm set  OT Visit Diagnosis:  Unsteadiness on feet (R26.81);Other abnormalities of gait and mobility (R26.89);Muscle  weakness (generalized) (M62.81)                Time: 4098-1191 OT Time Calculation (min): 30 min Charges:  OT General Charges $OT Visit: 1 Visit OT Evaluation $OT Eval Moderate Complexity: 1 Mod  Delmer Ferraris, OT  Acute Rehabilitation Services Office [phone] Secure chat preferred   Mickael Alamo 04/29/2024, 12:11 PM

## 2024-04-29 NOTE — Progress Notes (Signed)
 [facility] PROGRESS NOTE   Monica Curry UJW:[id number] DOB: June 21, 1931 DOA: 04/27/2024  PCP: Christel Cousins, MD  Brief History: 89 y.o. 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  Patient had a mechanical fall resulting in left hip fracture and left distal radius fracture.  She was hospitalized for further management.    Consultants: Orthopedics.  Hand surgeon  Procedures: ORIF left hip    Subjective/Interval History: Patient mentions that she feels well this morning.  Not having significant pain in the hip area.  Ate breakfast this morning.    Assessment/Plan:  Left hip fracture Underwent surgery on 5/21.  Pain is reasonably well-controlled.  PT and OT evaluation is pending.    Left distal radius fracture Patient underwent closed reduction of the left wrist in the emergency department with improved length and alignment of the distal radius.  She was seen by hand surgery.  No plans for surgical intervention at this time.  Outpatient follow-up.  Pain control.  Acute kidney injury Baseline creatinine seems to be around 1.2. Increase in BUN and creatinine noted today.  Not on any nephrotoxic agents.  Likely due to poor oral intake.  Continue with IV fluids for now.  Monitor urine output.  Urinary retention may have also contributed.  Will recheck labs tomorrow.  Essential hypertension Elevated blood pressure readings overnight likely due to pain issues.  Started back on propranolol .  Blood pressure is reasonably well-controlled.  Acute urinary retention Foley catheter had to be placed yesterday.  Voiding trial once she is more active.  Essential tremors Propranolol .  History of pulmonary embolism She has had more than 1 episode of same.  Is on lifelong anticoagulation.  Apixaban  is on hold for surgery.  No clear indication for bridging at this time.  Last PE at  least in our system was in 2022.  Thyroid  Nodule Patient is aware. Has had previous work up for same. Outpatient management.  DVT Prophylaxis: Resume apixaban  when cleared by orthopedics. Code Status: Full code Family Communication: Discussed with patient Disposition Plan: To be determined    Medications: Scheduled:  acetaminophen   650 mg Oral Q6H   atorvastatin   10 mg Oral Daily   Chlorhexidine Gluconate Cloth  6 each Topical Daily   docusate sodium  100 mg Oral BID   escitalopram   5 mg Oral QHS   hydrOXYzine   25 mg Oral Daily   magnesium  oxide  400 mg Oral BID   propranolol   40 mg Oral QID   Continuous:   ceFAZolin (ANCEF) IV 2 g (04/29/24 0531)   lactated ringers     PRN:HYDROmorphone (DILAUDID) injection, methocarbamol, metoCLOPramide **OR** metoCLOPramide (REGLAN) injection, ondansetron (ZOFRAN) IV, oxyCODONE, polyethylene glycol  Antibiotics: Anti-infectives (From admission, onward)    Start     Dose/Rate Route Frequency Ordered Stop   04/29/24 0600  ceFAZolin (ANCEF) IVPB 2g/100 mL premix        2 g 200 mL/hr over 30 Minutes Intravenous On call to O.R. 04/28/24 1620 04/28/24 1705   04/29/24 0000  ceFAZolin (ANCEF) IVPB 2g/100 mL premix        2 g 200 mL/hr over 30 Minutes Intravenous Every 8 hours 04/28/24 1905 04/29/24 2159   04/28/24 2200  ceFAZolin (ANCEF) IVPB 2g/100 mL premix  Status:  Discontinued        2 g 200 mL/hr over 30 Minutes Intravenous Every 8 hours 04/28/24 1845 04/28/24 1905  Objective:  Vital Signs  Vitals:   04/28/24 2009 04/29/24 0418 04/29/24 0808 04/29/24 0808  BP: (!) 155/66 (!) 109/56 (!) 117/49 (!) 117/49  Pulse: (!) 106 60 65 65  Resp: 18     Temp: 98.1 F (36.7 C) (!) 97.5 F (36.4 C) 98.5 F (36.9 C) 98.5 F (36.9 C)  TempSrc:   Oral Oral  SpO2: 92% 100% 94%   Weight:      Height:        Intake/Output Summary (Last 24 hours) at 04/29/2024 1015 Last data filed at 04/28/2024 1714 Gross per 24 hour  Intake 500 ml   Output 700 ml  Net -200 ml   Filed Weights   04/27/24 1702 04/28/24 1450  Weight: 63.5 kg 62.6 kg    General appearance: Awake alert.  In no distress Resp: Clear to auscultation bilaterally.  Normal effort Cardio: S1-S2 is normal regular.  No S3-S4.  No rubs murmurs or bruit GI: Abdomen is soft.  Nontender nondistended.  Bowel sounds are present normal.  No masses organomegaly  Lab Results:  Data Reviewed: I have personally reviewed following labs and reports of the imaging studies  CBC: Recent Labs  Lab 04/27/24 1709 04/27/24 1724 04/29/24 0423  WBC  --  8.8 11.0*  NEUTROABS  --  5.4  --   HGB 17.0* 15.2* 13.5  HCT 50.0* 46.9* 40.5  MCV  --  87.0 86.9  PLT  --  503* 363    Basic Metabolic Panel: Recent Labs  Lab 04/27/24 1709 04/27/24 1724 04/29/24 0423  NA 139 136 132*  K 4.5 4.7 4.6  CL 101 103 98  CO2  --  26 22  GLUCOSE 129* 117* 228*  BUN 29* 24* 30*  CREATININE 1.20* 1.28* 1.68*  CALCIUM   --  9.3 8.4*    GFR: Estimated Creatinine Clearance: 18.8 mL/min (A) (by C-G formula based on SCr of 1.68 mg/dL (H)).  Coagulation Profile: Recent Labs  Lab 04/27/24 1724  INR 1.6*     Recent Results (from the past 240 hours)  Surgical pcr screen     Status: None   Collection Time: 04/27/24  8:41 PM   Specimen: Nasal Mucosa; Nasal Swab  Result Value Ref Range Status   MRSA, PCR NEGATIVE NEGATIVE Final   Staphylococcus aureus NEGATIVE NEGATIVE Final    Comment: (NOTE) The Xpert SA Assay (FDA approved for NASAL specimens in patients 63 years of age and older), is one component of a comprehensive surveillance program. It is not intended to diagnose infection nor to guide or monitor treatment. Performed at [facility], [address], [place], [place] [address]   MRSA Next Gen by PCR, Nasal     Status: None   Collection Time: 04/28/24 10:42 AM   Specimen: Nasal Mucosa; Nasal Swab  Result Value Ref Range Status   MRSA by PCR Next Gen NOT  DETECTED NOT DETECTED Final    Comment: (NOTE) The GeneXpert MRSA Assay (FDA approved for NASAL specimens only), is one component of a comprehensive MRSA colonization surveillance program. It is not intended to diagnose MRSA infection nor to guide or monitor treatment for MRSA infections. Test performance is not FDA approved in patients less than 33 years old. Performed at [facility], [address] [place], [place] [address]       Radiology Studies: DG HIP PORT UNILAT W OR W/O PELVIS 1V LEFT Result Date: 04/28/2024 CLINICAL DATA:  Status post proximal femoral nailing.  EXAM: DG HIP (WITH OR WITHOUT PELVIS) 1V PORT LEFT COMPARISON:  Left femoral radiographs 04/27/2024 FINDINGS: Interval proximal left femoral cephalomedullary nail fixation of the previously seen intertrochanteric fracture. Improved alignment. There is again proximal displacement of the lesser trochanter. Expected postoperative lateral hip and thigh subcutaneous air. No perihardware lucency is seen to indicate hardware failure or loosening. Moderate superomedial right femoroacetabular joint space narrowing. Mild bilateral sacroiliac subchondral sclerosis. Mild pubic symphysis joint space narrowing. IMPRESSION: Interval proximal left femoral cephalomedullary nail fixation of the previously seen intertrochanteric fracture. Improved alignment. Electronically Signed   By: Bertina Broccoli M.D.   On: 04/28/2024 20:00   DG FEMUR MIN 2 VIEWS LEFT Result Date: 04/28/2024 CLINICAL DATA:  Left femur ORIF.  Intraoperative fluoroscopy. EXAM: LEFT FEMUR 2 VIEWS COMPARISON:  Left femur radiographs 04/27/2024 FINDINGS: Images were performed intraoperatively without the presence of a radiologist. The patient is undergoing cephalomedullary nail fixation of the previously seen left proximal femoral intertrochanteric fracture. No hardware complication is seen. Total fluoroscopy images: 6 Total fluoroscopy time: 58 seconds Total dose: Radiation  Exposure Index (as provided by the fluoroscopic device): 7.1 mGy air Kerma Please see intraoperative findings for further detail. IMPRESSION: Intraoperative fluoroscopy for left femur ORIF. Electronically Signed   By: Bertina Broccoli M.D.   On: 04/28/2024 19:59   DG C-Arm 1-60 Min-No Report Result Date: 04/28/2024 Fluoroscopy was utilized by the requesting physician.  No radiographic interpretation.   DG Wrist Complete Left Result Date: 04/27/2024 CLINICAL DATA:  Postreduction EXAM: LEFT WRIST - COMPLETE 3+ VIEW COMPARISON:  Earlier today FINDINGS: In cast views of the left wrist demonstrate distal left radial and ulnar fractures. Continued mild posterior displacement of the distal fracture fragments, but alignment is improved since prior study. IMPRESSION: Improved alignment with continued mild posterior displacement. Electronically Signed   By: Janeece Mechanic M.D.   On: 04/27/2024 20:16   DG Elbow Complete Left Result Date: 04/27/2024 CLINICAL DATA:  Fall, landed on left side EXAM: LEFT ELBOW - COMPLETE 3+ VIEW COMPARISON:  None Available. FINDINGS: No acute bony abnormality. Specifically, no fracture, subluxation, or dislocation. No joint effusion. IMPRESSION: No acute bony abnormality. Electronically Signed   By: Janeece Mechanic M.D.   On: 04/27/2024 20:15   DG FEMUR MIN 2 VIEWS LEFT Result Date: 04/27/2024 CLINICAL DATA:  Left femur fracture EXAM: LEFT FEMUR 2 VIEWS COMPARISON:  None Available. FINDINGS: There is a left femoral intertrochanteric fracture. Varus angulation. No subluxation or dislocation. No additional femoral fracture. No joint effusion in the left knee. IMPRESSION: Angulated left femoral intertrochanteric fracture. Electronically Signed   By: Janeece Mechanic M.D.   On: 04/27/2024 20:14   CT Head Wo Contrast Result Date: 04/27/2024 CLINICAL DATA:  Head trauma EXAM: CT HEAD WITHOUT CONTRAST CT CERVICAL SPINE WITHOUT CONTRAST TECHNIQUE: Multidetector CT imaging of the head and cervical spine  was performed following the standard protocol without intravenous contrast. Multiplanar CT image reconstructions of the cervical spine were also generated. RADIATION DOSE REDUCTION: This exam was performed according to the departmental dose-optimization program which includes automated exposure control, adjustment of the mA and/or kV according to patient size and/or use of iterative reconstruction technique. COMPARISON:  CT head 07/07/2021. FINDINGS: CT HEAD FINDINGS Brain: No acute intracranial hemorrhage. No CT evidence of acute infarct. Nonspecific hypoattenuation in the periventricular and subcortical white matter favored to reflect chronic microvascular ischemic changes. Mild parenchymal volume loss. No edema, mass effect, or midline shift. The basilar cisterns are patent. Multiple dural calcifications again noted.  Ventricles: The ventricles are normal. Vascular: Atherosclerotic calcifications of the carotid siphons. No hyperdense vessel. Skull: No acute or aggressive finding. Degenerative changes of the mandibular condyles, greater on the right. Orbits: Orbits are symmetric. Sinuses: Mild mucosal thickening in the ethmoid sinuses. Other: Mastoid air cells are clear. CT CERVICAL SPINE FINDINGS Alignment: Normal. Skull base and vertebrae: No acute fracture. No primary bone lesion or focal pathologic process. Soft tissues and spinal canal: No prevertebral fluid or swelling. No visible canal hematoma. Prominence of the right thyroid  lobe with multiple nodules noted. Disc levels: Intervertebral disc space narrowing most pronounced posteriorly at C3-4 with additional mild disc space narrowing at C5-6 and C6-7. Small disc bulges at multiple levels. No high-grade osseous spinal canal stenosis. Facet arthrosis and uncovertebral hypertrophy throughout the cervical spine with foraminal narrowing most pronounced on the right at C4-5 and bilaterally at C5-6. Upper chest: Septal thickening in the lung apices which could  reflect mild pulmonary edema. Other: None. IMPRESSION: No CT evidence of acute intracranial abnormality. No acute fracture or traumatic malalignment of the cervical spine. Degenerative changes of the cervical spine as above. Prominence of the right thyroid  lobe with heterogeneous appearance and multiple nodules. Consider correlation with nonemergent thyroid  ultrasound. Septal thickening in the lung apices concerning for pulmonary edema. Electronically Signed   By: Denny Flack M.D.   On: 04/27/2024 18:40   CT Cervical Spine Wo Contrast Result Date: 04/27/2024 CLINICAL DATA:  Head trauma EXAM: CT HEAD WITHOUT CONTRAST CT CERVICAL SPINE WITHOUT CONTRAST TECHNIQUE: Multidetector CT imaging of the head and cervical spine was performed following the standard protocol without intravenous contrast. Multiplanar CT image reconstructions of the cervical spine were also generated. RADIATION DOSE REDUCTION: This exam was performed according to the departmental dose-optimization program which includes automated exposure control, adjustment of the mA and/or kV according to patient size and/or use of iterative reconstruction technique. COMPARISON:  CT head 07/07/2021. FINDINGS: CT HEAD FINDINGS Brain: No acute intracranial hemorrhage. No CT evidence of acute infarct. Nonspecific hypoattenuation in the periventricular and subcortical white matter favored to reflect chronic microvascular ischemic changes. Mild parenchymal volume loss. No edema, mass effect, or midline shift. The basilar cisterns are patent. Multiple dural calcifications again noted. Ventricles: The ventricles are normal. Vascular: Atherosclerotic calcifications of the carotid siphons. No hyperdense vessel. Skull: No acute or aggressive finding. Degenerative changes of the mandibular condyles, greater on the right. Orbits: Orbits are symmetric. Sinuses: Mild mucosal thickening in the ethmoid sinuses. Other: Mastoid air cells are clear. CT CERVICAL SPINE FINDINGS  Alignment: Normal. Skull base and vertebrae: No acute fracture. No primary bone lesion or focal pathologic process. Soft tissues and spinal canal: No prevertebral fluid or swelling. No visible canal hematoma. Prominence of the right thyroid  lobe with multiple nodules noted. Disc levels: Intervertebral disc space narrowing most pronounced posteriorly at C3-4 with additional mild disc space narrowing at C5-6 and C6-7. Small disc bulges at multiple levels. No high-grade osseous spinal canal stenosis. Facet arthrosis and uncovertebral hypertrophy throughout the cervical spine with foraminal narrowing most pronounced on the right at C4-5 and bilaterally at C5-6. Upper chest: Septal thickening in the lung apices which could reflect mild pulmonary edema. Other: None. IMPRESSION: No CT evidence of acute intracranial abnormality. No acute fracture or traumatic malalignment of the cervical spine. Degenerative changes of the cervical spine as above. Prominence of the right thyroid  lobe with heterogeneous appearance and multiple nodules. Consider correlation with nonemergent thyroid  ultrasound. Septal thickening in the lung apices concerning for  pulmonary edema. Electronically Signed   By: Denny Flack M.D.   On: 04/27/2024 18:40   DG Hip Unilat W or Wo Pelvis 2-3 Views Left Result Date: 04/27/2024 CLINICAL DATA:  Left hip pain after fall today. EXAM: DG HIP (WITH OR WITHOUT PELVIS) 2-3V LEFT COMPARISON:  None Available. FINDINGS: Moderately displaced proximal left femoral neck fracture is noted. Right hip is unremarkable. IMPRESSION: Moderately displaced proximal left femoral neck fracture. Electronically Signed   By: Rosalene Colon M.D.   On: 04/27/2024 18:06   DG Wrist Complete Left Result Date: 04/27/2024 CLINICAL DATA:  Left wrist pain after fall. EXAM: LEFT WRIST - COMPLETE 3+ VIEW COMPARISON:  None Available. FINDINGS: Moderately displaced distal left radial fracture is noted with posterior displacement of distal  fragments. Minimally displaced distal left ulnar fracture is noted. Moderate degenerative changes seen involving first carpometacarpal joint. IMPRESSION: Displaced distal left radial and ulnar fractures as noted above. Electronically Signed   By: Rosalene Colon M.D.   On: 04/27/2024 18:05   DG Chest Port 1 View Result Date: 04/27/2024 CLINICAL DATA:  Fall. EXAM: PORTABLE CHEST 1 VIEW COMPARISON:  June 14, 2021. FINDINGS: Mild cardiomegaly with mild central pulmonary vascular congestion is noted. No consolidative process is noted. The visualized skeletal structures are unremarkable. IMPRESSION: Mild cardiomegaly with mild central pulmonary vascular congestion. Electronically Signed   By: Rosalene Colon M.D.   On: 04/27/2024 18:02       LOS: 2 days   Monica Curry Monica Curry  [facility] Pager on [email or web address]  04/29/2024, 10:15 AM

## 2024-04-29 NOTE — Plan of Care (Signed)
  Problem: Education: Goal: Knowledge of General Education information will improve Description: Including pain rating scale, medication(s)/side effects and non-pharmacologic comfort measures Outcome: Progressing   Problem: Clinical Measurements: Goal: Ability to maintain clinical measurements within normal limits will improve Outcome: Progressing   Problem: Activity: Goal: Risk for activity intolerance will decrease Outcome: Progressing   Problem: Coping: Goal: Level of anxiety will decrease Outcome: Progressing   Problem: Safety: Goal: Ability to remain free from injury will improve Outcome: Progressing   

## 2024-04-29 NOTE — Progress Notes (Signed)
 Orthopaedic Trauma Progress Note  SUBJECTIVE: Doing well this afternoon. Pain controlled. No chest pain. No SOB. No nausea/vomiting. No other complaints. Tolerating diet and fluids. Daughter at bedside  OBJECTIVE:  Vitals:   04/29/24 0808 04/29/24 0808  BP: (!) 117/49 (!) 117/49  Pulse: 65 65  Resp:    Temp: 98.5 F (36.9 C) 98.5 F (36.9 C)  SpO2: 94%     Opiates Today (MME): Today's  total administered Morphine Milligram Equivalents: 0 Opiates Yesterday (MME): Yesterday's total administered Morphine Milligram Equivalents: 116.25  General: Sitting up in bed, no acute distress Respiratory: No increased work of breathing.  Left lower extremity: Dressing CDI. Swelling/bruising over the hip as expected.  Ankle dorsiflexion/plantarflexion intact.  Able to wiggle the toes.  Endorses sensation to all aspects of the foot.  2+ DP pulse Left upper extremity: Sugar-tong splint in place.  Position not ideal.  I rewrapped Ace wraps for comfort.  Able to wiggle fingers.  Fingers warm well-perfused.  IMAGING: Stable post op imaging.   LABS:  Results for orders placed or performed during the hospital encounter of 04/27/24 (from the past 24 hours)  CBC     Status: Abnormal   Collection Time: 04/29/24  4:23 AM  Result Value Ref Range   WBC 11.0 (H) 4.0 - 10.5 K/uL   RBC 4.66 3.87 - 5.11 MIL/uL   Hemoglobin 13.5 12.0 - 15.0 g/dL   HCT 08.6 57.8 - 46.9 %   MCV 86.9 80.0 - 100.0 fL   MCH 29.0 26.0 - 34.0 pg   MCHC 33.3 30.0 - 36.0 g/dL   RDW 62.9 52.8 - 41.3 %   Platelets 363 150 - 400 K/uL   nRBC 0.0 0.0 - 0.2 %  Basic metabolic panel with GFR     Status: Abnormal   Collection Time: 04/29/24  4:23 AM  Result Value Ref Range   Sodium 132 (L) 135 - 145 mmol/L   Potassium 4.6 3.5 - 5.1 mmol/L   Chloride 98 98 - 111 mmol/L   CO2 22 22 - 32 mmol/L   Glucose, Bld 228 (H) 70 - 99 mg/dL   BUN 30 (H) 8 - 23 mg/dL   Creatinine, Ser 2.44 (H) 0.44 - 1.00 mg/dL   Calcium  8.4 (L) 8.9 - 10.3 mg/dL    GFR, Estimated 29 (L) >60 mL/min   Anion gap 12 5 - 15    ASSESSMENT: Monica Curry is a 89 y.o. female, 1 Day Post-Op s/p fall Procedures:  1.  INTRAMEDULLARY NAILING LEFT INTERTROCHANTERIC FEMUR FRACTURE 2.  NONOPERATIVE MANAGEMENT LEFT DISTAL RADIUS FRACTURE    CV/Blood loss: Acute blood loss anemia, Hgb 13.5 this AM. Hemodynamically stable  PLAN: Weightbearing: WBAT LLE.  NWB LUE ROM: Okay for unrestricted hip and knee motion Incisional and dressing care: Reinforce dressings as needed.  Maintain splint LUE Showering: Okay to begin getting LLE incisions wet starting 05/01/24.  Keep LUE splint covered and dry when showering Orthopedic device(s): Splint LUE Pain management:  1. Tylenol  650 mg q 6 hours scheduled 2. Robaxin 500 mg q 6 hours PRN 3. Oxycodone 2.5-5 mg q 4 hours PRN 4. Dilaudid 0.5 mg q 3 hours PRN VTE prophylaxis: Restart home dose Eliquis  today, SCDs ID:  Ancef 2gm post op Foley/Lines:  No foley, KVO IVFs Impediments to Fracture Healing: Vitamin D  level 110 when last checked 08/2023.  No supplementation needed Dispo: Have contacted Ortho tech to reapply splint to left upper extremity.  PT/OT evaluation today, recommending SNF.  Okay for discharge from ortho standpoint once cleared by medicine team and therapies  D/C recommendations: - Oxycodone 2.5 mg, Robaxin for pain control - Home dose Eliquis  for DVT prophylaxis - No additional need for Vit D supplementation  Follow - up plan: 2 weeks after d/c for wound check and repeat x-rays   Contact information:  Katheryne Pane MD, [name] PA-C. After hours and holidays please check [email or web address] for group call information for [facility]   Edilia Gordon, PA-C [phone] (office) [email or web address]

## 2024-04-29 NOTE — Progress Notes (Signed)
 PHARMACY - ANTICOAGULATION CONSULT NOTE  Pharmacy Consult:  Eliquis  Indication: History of PE/DVT  Allergies  Allergen Reactions   Codeine Hives   Other Other (See Comments)    Reaction: Fever (intolerance)   Cortisone     Causes jitteriness.   Dexamethasone      anxiety   Hctz [Hydrochlorothiazide ] Other (See Comments)    Low sodium   Meloxicam  Nausea Only    Gastritis and nausea   Mysoline [Primidone]    Pneumovax 23 [Pneumococcal Vac Polyvalent]     Red, raised area.  Knot at site of injection   Prednisone      Anxiety   Topiramate      Sleepiness, numbness feet tingling in arm.   Toradol  [Ketorolac  Tromethamine ] Swelling    Localized redness at injection site.     Ace Inhibitors Cough    Patient Measurements: Height: 5' 3.5" (161.3 cm) Weight: 62.6 kg (138 lb) IBW/kg (Calculated) : 53.55 HEPARIN  DW (KG): 62.6  Vital Signs: Temp: 98.5 F (36.9 C) (05/22 0808) Temp Source: Oral (05/22 0808) BP: 117/49 (05/22 0808) Pulse Rate: 65 (05/22 0808)  Labs: Recent Labs    04/27/24 1709 04/27/24 1724 04/29/24 0423  HGB 17.0* 15.2* 13.5  HCT 50.0* 46.9* 40.5  PLT  --  503* 363  LABPROT  --  19.0*  --   INR  --  1.6*  --   CREATININE 1.20* 1.28* 1.68*    Estimated Creatinine Clearance: 18.8 mL/min (A) (by C-G formula based on SCr of 1.68 mg/dL (H)).   Medical History: Past Medical History:  Diagnosis Date   Allergy    Medications and pollen   Anemia    Anxiety    Always   C. difficile colitis    Cancer (HCC)    Endometrial cancer   Cataract    Had surgery 2021   Chronic kidney disease 2018   2 siblings had kidney failure, sister had transplant   Clotting disorder (HCC)    Essential and other specified forms of tremor    Hyperlipidemia    Hypertension    Osteoporosis 11/2019   T score -2.8 stable from prior DEXA   Personal history of venous thrombosis and embolism 1997   Pulmonary embolism (HCC) 06/14/2021   Pulmonary embolus (HCC)    Shingles     Stroke (HCC) 2018   TIA   Thrush    TIA (transient ischemic attack)    carotid US  07/2022: no ICA stenosis bilaterally     Assessment: 89 YOF s/p nailing of L femur fracture on 5/21.  Pharmacy consulted to resume PTA DOAC on POD D#1 if hemoglobin is >/= 11 and patient did not require transfusion.  Patient meets criteria to restart Eliquis , for history of VTE. CBC stable.  Goal of Therapy:  Appropriate anticoagulation Monitor platelets by anticoagulation protocol: Yes   Plan:  Resume Eliquis  5mg  PO BID Pharmacy will sign off.  Thank you for the consult!  Pericles Carmicheal D. Marikay Show, PharmD, BCPS, BCCCP 04/29/2024, 10:57 AM

## 2024-04-29 NOTE — Evaluation (Signed)
 Physical Therapy Evaluation Patient Details Name: Monica Curry MRN: [id number] DOB: 10/06/1931 Today's Date: 04/29/2024  History of Present Illness  Patient is a 89 year old with mechanical fall resulting in left hip fracture and left distal radius fracture. S/p ORIF of L hip and non-operative management of LUE. PMH: Anemia, c-diff, CKD, HTN, osteoporosis, PE, TIA.  Clinical Impression  Patient is agreeable to PT evaluation. She lives alone and is independent with mobility prior to this hospital stay.  She is cooperative throughout. She required +2 person assistance for bed mobility, standing, and getting to the chair. Cues to maintain NWB of LUE with functional activity. Recommend to continue PT to maximize independence and decrease caregiver burden. Rehabilitation < 3 hours/day recommended after this hospital stay.       If plan is discharge home, recommend the following: Two people to help with walking and/or transfers;Two people to help with bathing/dressing/bathroom;Assist for transportation;Help with stairs or ramp for entrance;Assistance with cooking/housework   Can travel by Doctor, hospital (measurements PT)  Recommendations for Other Services       Functional Status Assessment Patient has had a recent decline in their functional status and demonstrates the ability to make significant improvements in function in a reasonable and predictable amount of time.     Precautions / Restrictions Precautions Precautions: Fall Recall of Precautions/Restrictions: Intact Restrictions Weight Bearing Restrictions Per Provider Order: Yes LUE Weight Bearing Per Provider Order: Non weight bearing LLE Weight Bearing Per Provider Order: Weight bearing as tolerated      Mobility  Bed Mobility Overal bed mobility: Needs Assistance Bed Mobility: Supine to Sit     Supine to sit: Max assist, +2 for physical assistance     General bed mobility  comments: assistance for LLE, trunk, and scooting hips forward towards edge of bed. cues for sequencing and technique    Transfers Overall transfer level: Needs assistance Equipment used: 2 person hand held assist Transfers: Sit to/from Stand, Bed to chair/wheelchair/BSC Sit to Stand: Mod assist, +2 physical assistance   Step pivot transfers: Max assist, +2 physical assistance       General transfer comment: cues for anterior weight shifting and to avoid weight bearing through LUE. increased assistance required to complete step transfer to chair with cues for sequencing of BLE.    Ambulation/Gait               General Gait Details: unable to at this time, poor standing tolerance  Stairs            Wheelchair Mobility     Tilt Bed    Modified Rankin (Stroke Patients Only)       Balance Overall balance assessment: Needs assistance Sitting-balance support: Feet supported Sitting balance-Leahy Scale: Fair     Standing balance support: Single extremity supported Standing balance-Leahy Scale: Poor Standing balance comment: heavy external support required to maintian standing balance                             Pertinent Vitals/Pain Pain Assessment Pain Assessment: Faces Faces Pain Scale: Hurts even more Pain Location: L hip Pain Descriptors / Indicators: Pressure Pain Intervention(s): Repositioned, Limited activity within patient's tolerance, Monitored during session, Ice applied    Home Living Family/patient expects to be discharged to:: Private residence Living Arrangements: Alone Available Help at Discharge: Available PRN/intermittently;Family;Friend(s) Type of Home: House Home Access: Stairs to enter  Entrance Stairs-Rails: Can reach both Entrance Stairs-Number of Steps: 4   Home Layout: Laundry or work area in [employer] Equipment: BSC/3in1;Other (comment);Hand held shower head (3 prong cane)      Prior Function Prior Level of  Function : Independent/Modified Independent             Mobility Comments: independent ADLs Comments: independent     Extremity/Trunk Assessment   Upper Extremity Assessment Upper Extremity Assessment: Defer to OT evaluation    Lower Extremity Assessment Lower Extremity Assessment: LLE deficits/detail LLE Deficits / Details: pain with AROM of L hip. 5/5 dorsiflexion and plantarflexion LLE Sensation: WNL       Communication   Communication Communication: No apparent difficulties    Cognition Arousal: Alert Behavior During Therapy: WFL for tasks assessed/performed   PT - Cognitive impairments: No apparent impairments                         Following commands: Impaired Following commands impaired: Follows one step commands with increased time     Cueing Cueing Techniques: Verbal cues     General Comments General comments (skin integrity, edema, etc.): Sp02 monitored throughout session and in the 90's on room air    Exercises     Assessment/Plan    PT Assessment Patient needs continued PT services  PT Problem List Decreased range of motion;Decreased strength;Decreased activity tolerance;Decreased balance;Decreased mobility;Pain;Decreased safety awareness;Decreased knowledge of precautions       PT Treatment Interventions Gait training;DME instruction;Functional mobility training;Stair training;Therapeutic activities;Therapeutic exercise;Balance training;Neuromuscular re-education;Cognitive remediation    PT Goals (Current goals can be found in the Care Plan section)  Acute Rehab PT Goals Patient Stated Goal: to get better PT Goal Formulation: With patient Time For Goal Achievement: 05/13/24 Potential to Achieve Goals: Fair    Frequency Min 2X/week     Co-evaluation PT/OT/SLP Co-Evaluation/Treatment: Yes Reason for Co-Treatment: To address functional/ADL transfers PT goals addressed during session: Mobility/safety with mobility          AM-PAC PT "6 Clicks" Mobility  Outcome Measure Help needed turning from your back to your side while in a flat bed without using bedrails?: A Lot Help needed moving from lying on your back to sitting on the side of a flat bed without using bedrails?: Total Help needed moving to and from a bed to a chair (including a wheelchair)?: Total Help needed standing up from a chair using your arms (e.g., wheelchair or bedside chair)?: Total Help needed to walk in hospital room?: Total Help needed climbing 3-5 steps with a railing? : Total 6 Click Score: 7    End of Session Equipment Utilized During Treatment: Gait belt Activity Tolerance: Patient tolerated treatment well Patient left: in chair;with call bell/phone within reach;with chair alarm set;with SCD's reapplied;with family/visitor present (ice pack applied L wrist and L hip) Nurse Communication: Mobility status PT Visit Diagnosis: Difficulty in walking, not elsewhere classified (R26.2);Other abnormalities of gait and mobility (R26.89)    Time: 2130-8657 PT Time Calculation (min) (ACUTE ONLY): 28 min   Charges:   PT Evaluation $PT Eval Moderate Complexity: 1 Mod   PT General Charges $$ ACUTE PT VISIT: 1 Visit         Ozie Bo, PT, MPT   Erlene Hawks 04/29/2024, 11:38 AM

## 2024-04-29 NOTE — Progress Notes (Signed)
 Orthopedic Tech Progress Note Patient Details:  Monica Curry 11-17-1931 [id number]  Sugar tong splint on the L arm was removed and replaced with a volar splint per request from [name], PA-C.   Ortho Devices Type of Ortho Device: Volar splint Ortho Device/Splint Location: LUE Ortho Device/Splint Interventions: Ordered, Application, Adjustment   Post Interventions Patient Tolerated: Well Instructions Provided: Care of device  Monica Curry 04/29/2024, 5:56 PM

## 2024-04-29 NOTE — Anesthesia Postprocedure Evaluation (Signed)
 Anesthesia Post Note  Patient: Monica Curry  Procedure(s) Performed: FIXATION, FRACTURE, INTERTROCHANTERIC, WITH INTRAMEDULLARY ROD (Left)     Patient location during evaluation: PACU Anesthesia Type: General Level of consciousness: awake and alert Pain management: pain level controlled Vital Signs Assessment: post-procedure vital signs reviewed and stable Respiratory status: spontaneous breathing, nonlabored ventilation, respiratory function stable and patient connected to nasal cannula oxygen Cardiovascular status: blood pressure returned to baseline and stable Postop Assessment: no apparent nausea or vomiting Anesthetic complications: no   No notable events documented.  Last Vitals:  Vitals:   04/28/24 2009 04/29/24 0418  BP: (!) 155/66 (!) 109/56  Pulse: (!) 106 60  Resp: 18   Temp: 36.7 C (!) 36.4 C  SpO2: 92% 100%    Last Pain:  Vitals:   04/29/24 0531  TempSrc:   PainSc: 3                  Monica Curry

## 2024-04-30 ENCOUNTER — Encounter (HOSPITAL_COMMUNITY): Payer: Self-pay | Admitting: Student

## 2024-04-30 DIAGNOSIS — N179 Acute kidney failure, unspecified: Secondary | ICD-10-CM | POA: Diagnosis not present

## 2024-04-30 DIAGNOSIS — I10 Essential (primary) hypertension: Secondary | ICD-10-CM | POA: Diagnosis not present

## 2024-04-30 LAB — URINALYSIS, ROUTINE W REFLEX MICROSCOPIC
Bilirubin Urine: NEGATIVE
Glucose, UA: NEGATIVE mg/dL
Hgb urine dipstick: NEGATIVE
Ketones, ur: NEGATIVE mg/dL
Leukocytes,Ua: NEGATIVE
Nitrite: NEGATIVE
Protein, ur: NEGATIVE mg/dL
Specific Gravity, Urine: 1.006 (ref 1.005–1.030)
pH: 6 (ref 5.0–8.0)

## 2024-04-30 LAB — BASIC METABOLIC PANEL WITH GFR
Anion gap: 9 (ref 5–15)
BUN: 28 mg/dL — ABNORMAL HIGH (ref 8–23)
CO2: 24 mmol/L (ref 22–32)
Calcium: 8.4 mg/dL — ABNORMAL LOW (ref 8.9–10.3)
Chloride: 102 mmol/L (ref 98–111)
Creatinine, Ser: 1.35 mg/dL — ABNORMAL HIGH (ref 0.44–1.00)
GFR, Estimated: 37 mL/min — ABNORMAL LOW (ref >60)
Glucose, Bld: 133 mg/dL — ABNORMAL HIGH (ref 70–99)
Potassium: 4.4 mmol/L (ref 3.5–5.1)
Sodium: 135 mmol/L (ref 135–145)

## 2024-04-30 LAB — CBC
HCT: 40.3 % (ref 36.0–46.0)
Hemoglobin: 13.2 g/dL (ref 12.0–15.0)
MCH: 28.1 pg (ref 26.0–34.0)
MCHC: 32.8 g/dL (ref 30.0–36.0)
MCV: 85.9 fL (ref 80.0–100.0)
Platelets: 412 10*3/uL — ABNORMAL HIGH (ref 150–400)
RBC: 4.69 MIL/uL (ref 3.87–5.11)
RDW: 14.6 % (ref 11.5–15.5)
WBC: 17.5 10*3/uL — ABNORMAL HIGH (ref 4.0–10.5)
nRBC: 0 % (ref 0.0–0.2)

## 2024-04-30 MED ORDER — POLYETHYLENE GLYCOL 3350 17 G PO PACK
17.0000 g | PACK | Freq: Every day | ORAL | Status: DC
Start: 2024-04-30 — End: 2024-05-04
  Administered 2024-04-30 – 2024-05-03 (×2): 17 g via ORAL
  Filled 2024-04-30 (×5): qty 1

## 2024-04-30 MED ORDER — METHOCARBAMOL 500 MG PO TABS: 500.0000 mg | ORAL_TABLET | Freq: Three times a day (TID) | ORAL | 0 refills | Status: DC | PRN

## 2024-04-30 MED ORDER — OXYCODONE HCL 5 MG PO TABS: 2.5000 mg | ORAL_TABLET | ORAL | 0 refills | Status: DC | PRN

## 2024-04-30 NOTE — Care Management Important Message (Signed)
 Important Message  Patient Details  Name: Monica Curry MRN: [id number] Date of Birth: [birth year]   Important Message Given:  Yes - Medicare IM     Felix Host 04/30/2024, 1:02 PM

## 2024-04-30 NOTE — Progress Notes (Signed)
 Occupational Therapy Treatment Patient Details Name: Monica Curry MRN: [id number] DOB: [birth year] Today's Date: 04/30/2024   History of present illness Patient is a 89 year old with mechanical fall resulting in left hip fracture and left distal radius fracture. S/p ORIF of L hip and non-operative management of LUE. PMH: Anemia, c-diff, CKD, HTN, osteoporosis, PE, TIA.   OT comments  Pt progressing well towards goals. Progressed today to complete functional transfers with [facility] assist +1. Pt now in short arm splint, allowing for elbow AROM. Pt presenting with decreased supination ~20 degrees. Encouraged to complete forearm supination and pronation x10 reps. Edema decreased in L index finger, continues to encourage elevation and AROM. Educated pt and daughter on compensatory strategies for UB and LB dressing, able to return demo with minimal cues. Continue to recommend <3 hours of skilled rehab daily to optimize independence levels. Will continue to follow acutely.       If plan is discharge home, recommend the following:  Two people to help with walking and/or transfers;A lot of help with bathing/dressing/bathroom   Equipment Recommendations  Other (comment) (Defer to next venue)    Recommendations for Other Services      Precautions / Restrictions Precautions Precautions: Fall Recall of Precautions/Restrictions: Intact Required Braces or Orthoses: Splint/Cast Splint/Cast: LUE short arm splint Restrictions Weight Bearing Restrictions Per Provider Order: Yes LUE Weight Bearing Per Provider Order: Weight bear through elbow only LLE Weight Bearing Per Provider Order: Weight bearing as tolerated Other Position/Activity Restrictions: Per secure chat with [name] on 5/23 pt can elbow WB if tolerated, however if too painful needs to be full NWB.       Mobility Bed Mobility Overal bed mobility: Needs Assistance Bed Mobility: Supine to Sit, Sit to Supine     Supine to sit: Mod  assist, +2 for physical assistance     General bed mobility comments: Assist for LLE and trunk    Transfers Overall transfer level: Needs assistance Equipment used: 2 person hand held assist, 1 person hand held assist Transfers: Sit to/from Stand, Bed to chair/wheelchair/BSC Sit to Stand: Min assist, +2 physical assistance, Mod assist     Step pivot transfers: Mod assist     General transfer comment: Min assist +2 to come to stand and SPT to BSC, mod +1 assist to lateral step to recliner     Balance Overall balance assessment: Needs assistance Sitting-balance support: Feet supported Sitting balance-Leahy Scale: Fair     Standing balance support: Single extremity supported, During functional activity Standing balance-Leahy Scale: Poor Standing balance comment: reliant on external support     ADL either performed or assessed with clinical judgement   ADL Overall ADL's : Needs assistance/impaired Eating/Feeding: Set up;Sitting         Upper Body Dressing : Set up;Cueing for compensatory techniques;Sitting Upper Body Dressing Details (indicate cue type and reason): Cues for hemibody dressing techniques Lower Body Dressing: Moderate assistance;Sit to/from stand Lower Body Dressing Details (indicate cue type and reason): Cues for hemibody dressing techniques, pt able to thread legs, assist to don above waist Toilet Transfer: Moderate assistance;Stand-pivot;BSC/3in1 Statistician Details (indicate cue type and reason): HH assist to SPT to [facility] and lateral steps to recliner Toileting- Clothing Manipulation and Hygiene: Total assistance;Sit to/from stand       Functional mobility during ADLs: Moderate assistance (HH assist) General ADL Comments: Educated on hemibody dressing techniques to assist in LB dressing    Extremity/Trunk Assessment Upper Extremity Assessment Upper Extremity Assessment: LUE  deficits/detail LUE Deficits / Details: L distal radius fx,decreased edema  in index finger, edema in thumb still present, shoulder WFL, decreased supination ~20 degrees LUE Sensation: decreased light touch LUE Coordination: decreased fine motor   Lower Extremity Assessment Lower Extremity Assessment: Defer to PT evaluation        Vision   Vision Assessment?: No apparent visual deficits         Communication Communication Communication: No apparent difficulties   Cognition Arousal: Alert Behavior During Therapy: WFL for tasks assessed/performed Cognition: No apparent impairments     Following commands: Impaired Following commands impaired: Follows one step commands with increased time      Cueing   Cueing Techniques: Verbal cues  Exercises Exercises: Hand exercises Hand Exercises Forearm Supination: AROM, Left, 10 reps, Seated Forearm Pronation: AROM, Left, 10 reps, Seated Digit Composite Flexion: AROM, Left, 10 reps, Seated, Other (comment) (yellow foam squeeze)       General Comments VSS on RA    Pertinent Vitals/ Pain       Pain Assessment Pain Assessment: Faces Faces Pain Scale: Hurts little more Pain Location: L hip with mobility Pain Descriptors / Indicators: Aching, Discomfort, Guarding, Grimacing Pain Intervention(s): Monitored during session, Repositioned   Frequency  Min 2X/week        Progress Toward Goals  OT Goals(current goals can now be found in the care plan section)  Progress towards OT goals: Progressing toward goals  Acute Rehab OT Goals Patient Stated Goal: To get stronger OT Goal Formulation: With patient Time For Goal Achievement: 05/13/24 Potential to Achieve Goals: Good ADL Goals Pt Will Perform Upper Body Dressing: with set-up;sitting Pt Will Perform Lower Body Dressing: with mod assist;sit to/from stand Pt Will Transfer to Toilet: with mod assist;bedside commode Pt Will Perform Toileting - Clothing Manipulation and hygiene: with mod assist;sit to/from stand  Plan         AM-PAC OT "6 Clicks"  Daily Activity     Outcome Measure   Help from another person eating meals?: None Help from another person taking care of personal grooming?: A Little Help from another person toileting, which includes using toliet, bedpan, or urinal?: Total Help from another person bathing (including washing, rinsing, drying)?: A Lot Help from another person to put on and taking off regular upper body clothing?: A Little Help from another person to put on and taking off regular lower body clothing?: A Lot 6 Click Score: 15    End of Session Equipment Utilized During Treatment: Gait belt  OT Visit Diagnosis: Unsteadiness on feet (R26.81);Other abnormalities of gait and mobility (R26.89);Muscle weakness (generalized) (M62.81)   Activity Tolerance Patient tolerated treatment well   Patient Left in chair;with call bell/phone within reach;with chair alarm set;with family/visitor present;with SCD's reapplied   Nurse Communication Mobility status        Time: 2841-3244 OT Time Calculation (min): 35 min  Charges: OT General Charges $OT Visit: 1 Visit OT Treatments $Self Care/Home Management : 23-37 mins  Delmer Ferraris, OT  Acute Rehabilitation Services Office [phone] Secure chat preferred   Mickael Alamo 04/30/2024, 2:13 PM

## 2024-04-30 NOTE — Plan of Care (Signed)
   Problem: Nutrition: Goal: Adequate nutrition will be maintained Outcome: Progressing   Problem: Coping: Goal: Level of anxiety will decrease Outcome: Progressing   Problem: Pain Managment: Goal: General experience of comfort will improve and/or be controlled Outcome: Progressing

## 2024-04-30 NOTE — [place] FL2 (Signed)
 [place]  MEDICAID FL2 LEVEL OF CARE FORM     IDENTIFICATION  Patient Name: Monica Curry Birthdate: [birth year] Sex: female Admission Date (Current Location): 04/27/2024  [facility] and [place] Number:  Producer, television/film/video and Address:  [facility] [place] [facility], [address] [place], [place] [address]      Provider Number: [id number]  Attending Physician Name and Address:  Maylene Spear, MD  Relative Name and Phone Number:  Lutz,Carol Daughter [phone]    Current Level of Care: Hospital Recommended Level of Care: Skilled Nursing Facility Prior Approval Number:    Date Approved/Denied:   PASRR Number:    Discharge Plan: SNF    Current Diagnoses: Patient Active Problem List   Diagnosis Date Noted   Fracture, [place], left, closed 04/28/2024   Hip fracture (HCC) 04/27/2024   GAD (generalized anxiety disorder) 04/01/2024   Thyroid  nodule 01/10/2022   Anxiety 08/10/2021   Aortic atherosclerosis (HCC) - CT 06/2021 06/15/2021   Bilateral renal cysts 07/06/2020   Paroxysmal supraventricular tachycardia (HCC) 10/20/2019   History of skin cancer (basal cell) 06/16/2019   Complete right bundle branch block 06/16/2019   B12 deficiency 06/15/2019   History of TIA (transient ischemic attack) 10/23/2017   Chronic kidney disease, stage 3b (HCC) 07/21/2015   Medication management 07/04/2014   Essential hypertension 11/25/2013   Hyperlipidemia, mixed 11/25/2013   Abnormal glucose 11/25/2013   Vitamin D  deficiency 11/25/2013   Essential tremor 11/25/2013   History of endometrial cancer    Hx/o DVT/PE    Urethral prolapse    Osteoporosis 10/10/2011    Orientation RESPIRATION BLADDER Height & Weight     Self, Time, Situation, Place  O2 Continent, Indwelling catheter Weight: 138 lb (62.6 kg) Height:  5' 3.5" (161.3 cm)  BEHAVIORAL SYMPTOMS/MOOD NEUROLOGICAL BOWEL NUTRITION STATUS      Continent Diet (see discharge summary)  AMBULATORY STATUS COMMUNICATION  OF NEEDS Skin   Total Care Verbally Surgical wounds                       Personal Care Assistance Level of Assistance  Bathing, Feeding, Dressing Bathing Assistance: Limited assistance Feeding assistance: Limited assistance Dressing Assistance: Limited assistance     Functional Limitations Info  Sight, Hearing, Speech Sight Info: Adequate Hearing Info: Adequate Speech Info: Adequate    SPECIAL CARE FACTORS FREQUENCY  PT (By licensed PT), OT (By licensed OT)     PT Frequency: 5x week OT Frequency: 5x week            Contractures Contractures Info: Not present    Additional Factors Info  Code Status, Allergies Code Status Info: full Allergies Info: Codeine, Other, Cortisone, Dexamethasone , Hctz (Hydrochlorothiazide ), Meloxicam , Mysoline (Primidone), Pneumovax 23 (Pneumococcal Vac Polyvalent), Prednisone , Topiramate , Toradol  (Ketorolac  Tromethamine ), Ace Inhibitors           Current Medications (04/30/2024):  This is the current hospital active medication list Current Facility-Administered Medications  Medication Dose Route Frequency Provider Last Rate Last Admin   acetaminophen  (TYLENOL ) tablet 650 mg  650 mg Oral Q6H [name] A, PA-C   650 mg at 04/30/24 1300   apixaban  (ELIQUIS ) tablet 5 mg  5 mg Oral BID Amend, Caron G, RPH   5 mg at 04/30/24 7846   atorvastatin  (LIPITOR) tablet 10 mg  10 mg Oral Daily Versie Gores, PA-C   10 mg at 04/30/24 9629   Chlorhexidine Gluconate Cloth 2 % PADS 6 each  6 each Topical Daily  Versie Gores, PA-C   6 each at 04/30/24 1610   docusate sodium  (COLACE) capsule 100 mg  100 mg Oral BID Versie Gores, PA-C   100 mg at 04/30/24 9604   escitalopram  (LEXAPRO ) tablet 5 mg  5 mg Oral QHS Versie Gores, PA-C   5 mg at 04/29/24 2113   HYDROmorphone  (DILAUDID ) injection 0.5 mg  0.5 mg Intravenous Q3H PRN Versie Gores, PA-C       hydrOXYzine  (ATARAX ) tablet 25 mg  25 mg Oral Daily Versie Gores, PA-C   25 mg at  04/30/24 5409   magnesium  oxide (MAG-OX) tablet 400 mg  400 mg Oral BID Versie Gores, PA-C   400 mg at 04/30/24 8119   methocarbamol  (ROBAXIN ) tablet 500 mg  500 mg Oral Q8H PRN Versie Gores, PA-C   500 mg at 04/29/24 2356   metoCLOPramide  (REGLAN ) tablet 5-10 mg  5-10 mg Oral Q8H PRN Versie Gores, PA-C       Or   metoCLOPramide  (REGLAN ) injection 5-10 mg  5-10 mg Intravenous Q8H PRN Versie Gores, PA-C       ondansetron  (ZOFRAN ) injection 4 mg  4 mg Intravenous Q6H PRN Versie Gores, PA-C   4 mg at 04/27/24 2219   oxyCODONE  (Oxy IR/ROXICODONE ) immediate release tablet 2.5-5 mg  2.5-5 mg Oral Q4H PRN Versie Gores, PA-C   5 mg at 04/29/24 2114   polyethylene glycol (MIRALAX  / GLYCOLAX ) packet 17 g  17 g Oral Daily Versie Gores, PA-C   17 g at 04/30/24 1478   propranolol  (INDERAL ) tablet 40 mg  40 mg Oral QID Versie Gores, PA-C   40 mg at 04/30/24 1332     Discharge Medications: Please see discharge summary for a list of discharge medications.  Relevant Imaging Results:  Relevant Lab Results:   Additional Information SSN: [id number]  Elspeth Hals, LCSW

## 2024-04-30 NOTE — Progress Notes (Signed)
 RE:  Monica Curry       Date of Birth: September 28, 2031      Date:  04/30/24        To Whom It May Concern:  Please be advised that the above-named patient will require a short-term nursing home stay - anticipated 30 days or less for rehabilitation and strengthening.  The plan is for return home.                 MD signature                Date

## 2024-04-30 NOTE — Plan of Care (Signed)
   Problem: Education: Goal: Knowledge of General Education information will improve Description: Including pain rating scale, medication(s)/side effects and non-pharmacologic comfort measures Outcome: Progressing   Problem: Activity: Goal: Risk for activity intolerance will decrease Outcome: Progressing   Problem: Pain Managment: Goal: General experience of comfort will improve and/or be controlled Outcome: Progressing

## 2024-04-30 NOTE — Progress Notes (Addendum)
 [facility] PROGRESS NOTE   Monica Curry BJY:[id number] DOB: October 06, 1931 DOA: 04/27/2024  PCP: Christel Cousins, MD  Brief History: 89 y.o. 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  Patient had a mechanical fall resulting in left hip fracture and left distal radius fracture.  She was hospitalized for further management.    Consultants: Orthopedics.  Hand surgeon  Procedures: ORIF left hip    Subjective/Interval History: Patient feels well this morning.  Mentions discomfort in the left forearm and hand after splint was changed yesterday.  Asked her to discuss this with orthopedics.    Assessment/Plan:  Left hip fracture Underwent surgery on 5/21.  Pain is reasonably well-controlled.  Seen by physical therapy.  Skilled nursing facility is recommended.  TOC is following.    Left distal radius fracture Patient underwent closed reduction of the left wrist in the emergency department with improved length and alignment of the distal radius.  She was seen by hand surgery.  No plans for surgical intervention at this time.  Outpatient follow-up.  Pain control. Splint was changed yesterday.  Patient mentions discomfort this morning.  I have asked her to discuss this with orthopedics.  Able to move her fingers.  Acute kidney injury Baseline creatinine seems to be around 1.2. Increase in BUN and creatinine noted yesterday.  Not on any nephrotoxic agents.  Likely due to poor oral intake.   She was given IV fluids.  Urinary retention may have also contributed.  Follow-up on labs from today.   Creatinine noted to be better today.  Will stop IV fluids.  Leukocytosis Elevated WBC count noted today.  She is afebrile.  Most probably reactive.  Check UA.  Essential hypertension Elevated blood pressure readings likely due to pain issues.  Started back on propranolol .  Blood pressure is  reasonably well-controlled.  Acute urinary retention Foley catheter had to be placed.  Should be able to remove Foley today.  Will first wait on her renal function to result today.   Essential tremors Propranolol .  History of pulmonary embolism She has had more than 1 episode of same.  Is on lifelong anticoagulation.  Apixaban  was held for surgery.  Has been resumed.   Last PE at least in our system was in 2022.  Thyroid  Nodule Patient is aware. Has had previous work up for same. Outpatient management.  DVT Prophylaxis: Resume apixaban  when cleared by orthopedics. Code Status: Full code Family Communication: Discussed with patient Disposition Plan: To be determined    Medications: Scheduled:  acetaminophen   650 mg Oral Q6H   apixaban   5 mg Oral BID   atorvastatin   10 mg Oral Daily   Chlorhexidine Gluconate Cloth  6 each Topical Daily   docusate sodium  100 mg Oral BID   escitalopram   5 mg Oral QHS   hydrOXYzine   25 mg Oral Daily   magnesium  oxide  400 mg Oral BID   propranolol   40 mg Oral QID   Continuous:  lactated ringers 75 mL/hr at 04/29/24 1139   PRN:HYDROmorphone (DILAUDID) injection, methocarbamol, metoCLOPramide **OR** metoCLOPramide (REGLAN) injection, ondansetron (ZOFRAN) IV, oxyCODONE, polyethylene glycol  Antibiotics: Anti-infectives (From admission, onward)    Start     Dose/Rate Route Frequency Ordered Stop   04/29/24 0600  ceFAZolin (ANCEF) IVPB 2g/100 mL premix        2 g 200 mL/hr over 30 Minutes Intravenous On call to O.R. 04/28/24 1620 04/28/24  1705   04/29/24 0000  ceFAZolin (ANCEF) IVPB 2g/100 mL premix        2 g 200 mL/hr over 30 Minutes Intravenous Every 8 hours 04/28/24 1905 04/29/24 1639   04/28/24 2200  ceFAZolin (ANCEF) IVPB 2g/100 mL premix  Status:  Discontinued        2 g 200 mL/hr over 30 Minutes Intravenous Every 8 hours 04/28/24 1845 04/28/24 1905       Objective:  Vital Signs  Vitals:   04/29/24 1558 04/29/24 2025 04/30/24  0527 04/30/24 0833  BP: (!) 130/55 139/60 124/76 (!) 145/60  Pulse: 72 81 62 68  Resp: 16 18 18    Temp: 98.6 F (37 C) 98.1 F (36.7 C) 97.8 F (36.6 C) 97.7 F (36.5 C)  TempSrc: Oral  Oral   SpO2: 95% 95% 96% 97%  Weight:      Height:        Intake/Output Summary (Last 24 hours) at 04/30/2024 0839 Last data filed at 04/30/2024 0500 Gross per 24 hour  Intake 1926.46 ml  Output 2425 ml  Net -498.54 ml   Filed Weights   04/27/24 1702 04/28/24 1450  Weight: 63.5 kg 62.6 kg    General appearance: Awake alert.  In no distress Resp: Clear to auscultation bilaterally.  Normal effort Cardio: S1-S2 is normal regular.  No S3-S4.  No rubs murmurs or bruit GI: Abdomen is soft.  Nontender nondistended.  Bowel sounds are present normal.  No masses organomegaly   Lab Results:  Data Reviewed: I have personally reviewed following labs and reports of the imaging studies  CBC: Recent Labs  Lab 04/27/24 1709 04/27/24 1724 04/29/24 0423  WBC  --  8.8 11.0*  NEUTROABS  --  5.4  --   HGB 17.0* 15.2* 13.5  HCT 50.0* 46.9* 40.5  MCV  --  87.0 86.9  PLT  --  503* 363    Basic Metabolic Panel: Recent Labs  Lab 04/27/24 1709 04/27/24 1724 04/29/24 0423  NA 139 136 132*  K 4.5 4.7 4.6  CL 101 103 98  CO2  --  26 22  GLUCOSE 129* 117* 228*  BUN 29* 24* 30*  CREATININE 1.20* 1.28* 1.68*  CALCIUM   --  9.3 8.4*    GFR: Estimated Creatinine Clearance: 18.8 mL/min (A) (by C-G formula based on SCr of 1.68 mg/dL (H)).  Coagulation Profile: Recent Labs  Lab 04/27/24 1724  INR 1.6*     Recent Results (from the past 240 hours)  Surgical pcr screen     Status: None   Collection Time: 04/27/24  8:41 PM   Specimen: Nasal Mucosa; Nasal Swab  Result Value Ref Range Status   MRSA, PCR NEGATIVE NEGATIVE Final   Staphylococcus aureus NEGATIVE NEGATIVE Final    Comment: (NOTE) The Xpert SA Assay (FDA approved for NASAL specimens in patients 77 years of age and older), is one  component of a comprehensive surveillance program. It is not intended to diagnose infection nor to guide or monitor treatment. Performed at [facility], [address] [place], [place] [address]   MRSA Next Gen by PCR, Nasal     Status: None   Collection Time: 04/28/24 10:42 AM   Specimen: Nasal Mucosa; Nasal Swab  Result Value Ref Range Status   MRSA by PCR Next Gen NOT DETECTED NOT DETECTED Final    Comment: (NOTE) The GeneXpert MRSA Assay (FDA approved for NASAL specimens only), is one component of a comprehensive MRSA colonization  surveillance program. It is not intended to diagnose MRSA infection nor to guide or monitor treatment for MRSA infections. Test performance is not FDA approved in patients less than 38 years old. Performed at [facility], [address] [place], [place] [address]       Radiology Studies: DG HIP PORT UNILAT W OR W/O PELVIS 1V LEFT Result Date: 04/28/2024 CLINICAL DATA:  Status post proximal femoral nailing. EXAM: DG HIP (WITH OR WITHOUT PELVIS) 1V PORT LEFT COMPARISON:  Left femoral radiographs 04/27/2024 FINDINGS: Interval proximal left femoral cephalomedullary nail fixation of the previously seen intertrochanteric fracture. Improved alignment. There is again proximal displacement of the lesser trochanter. Expected postoperative lateral hip and thigh subcutaneous air. No perihardware lucency is seen to indicate hardware failure or loosening. Moderate superomedial right femoroacetabular joint space narrowing. Mild bilateral sacroiliac subchondral sclerosis. Mild pubic symphysis joint space narrowing. IMPRESSION: Interval proximal left femoral cephalomedullary nail fixation of the previously seen intertrochanteric fracture. Improved alignment. Electronically Signed   By: Bertina Broccoli M.D.   On: 04/28/2024 20:00   DG FEMUR MIN 2 VIEWS LEFT Result Date: 04/28/2024 CLINICAL DATA:  Left femur ORIF.  Intraoperative fluoroscopy. EXAM: LEFT FEMUR 2  VIEWS COMPARISON:  Left femur radiographs 04/27/2024 FINDINGS: Images were performed intraoperatively without the presence of a radiologist. The patient is undergoing cephalomedullary nail fixation of the previously seen left proximal femoral intertrochanteric fracture. No hardware complication is seen. Total fluoroscopy images: 6 Total fluoroscopy time: 58 seconds Total dose: Radiation Exposure Index (as provided by the fluoroscopic device): 7.1 mGy air Kerma Please see intraoperative findings for further detail. IMPRESSION: Intraoperative fluoroscopy for left femur ORIF. Electronically Signed   By: Bertina Broccoli M.D.   On: 04/28/2024 19:59   DG C-Arm 1-60 Min-No Report Result Date: 04/28/2024 Fluoroscopy was utilized by the requesting physician.  No radiographic interpretation.       LOS: 3 days   Monica Curry [employer] on [email or web address]  04/30/2024, 8:39 AM

## 2024-04-30 NOTE — Progress Notes (Signed)
 Orthopaedic Trauma Progress Note  SUBJECTIVE: Doing fairly well this morning.  Pain controlled at rest.  Noted some increased wrist pain after transitioning the splint yesterday. Had some muscle spasms in her legs overnight, Robaxin seemed to help.  Pain controlled with Tylenol  throughout the day, has required a dose of oxycodone and Robaxin at bedtime over the last 2 days.  No BM since admission.  Denies any significant abdominal pain.  I have scheduled MiraLAX dosing.  No chest pain. No SOB. No nausea/vomiting. No other complaints. Tolerating diet and fluids. Daughter at bedside  OBJECTIVE:  Vitals:   04/30/24 0527 04/30/24 0833  BP: 124/76 (!) 145/60  Pulse: 62 68  Resp: 18   Temp: 97.8 F (36.6 C) 97.7 F (36.5 C)  SpO2: 96% 97%    Opiates Today (MME): Today's  total administered Morphine Milligram Equivalents: 0 Opiates Yesterday (MME): Yesterday's total administered Morphine Milligram Equivalents: 7.5  General: Sitting up in bed, no acute distress Respiratory: No increased work of breathing.  Left lower extremity: Dressings removed, incisions are CDI.  New Mepilex applied swelling/bruising over the hip as expected.  Ankle dorsiflexion/plantarflexion intact.  Able to wiggle the toes.  Endorses sensation to all aspects of the foot.  2+ DP pulse Left upper extremity: Short arm splint in place.  Nontender about the splint.  Able to wiggle fingers.  Fingers warm well-perfused.  IMAGING: Stable post op imaging.   LABS:  No results found for this or any previous visit (from the past 24 hours).   ASSESSMENT: Monica Curry is a 89 y.o. female, 2 Days Post-Op s/p fall Procedures:  1.  INTRAMEDULLARY NAILING LEFT INTERTROCHANTERIC FEMUR FRACTURE 2.  NONOPERATIVE MANAGEMENT LEFT DISTAL RADIUS FRACTURE    CV/Blood loss: Acute blood loss anemia, Hgb 13.2 this AM. Stable.  Hemodynamically stable  PLAN: Weightbearing: WBAT LLE.  NWB LUE ROM: Okay for unrestricted hip and knee  motion Incisional and dressing care: Change dressing LLE as needed.  Maintain splint LUE Showering: Okay to begin getting LLE incisions wet starting 05/01/24.  Keep LUE splint covered and dry when showering Orthopedic device(s): Splint LUE Pain management:  1. Tylenol  650 mg q 6 hours scheduled 2. Robaxin 500 mg q 6 hours PRN 3. Oxycodone 2.5-5 mg q 4 hours PRN 4. Dilaudid 0.5 mg q 3 hours PRN VTE prophylaxis: home dose Eliquis , SCDs ID:  Ancef 2gm post op completed Foley/Lines:  Foley in place, remove today. KVO IVFs Impediments to Fracture Healing: Vitamin D  level 110 when last checked 08/2023.  No supplementation needed Dispo:  PT/OT evaluation ongoing, recommending SNF.  Patient and her daughter are in agreement.  Okay for discharge from ortho standpoint once cleared by medicine team and therapies.  I have signed and placed discharge Rx for pain medication in patient's chart  D/C recommendations: - Oxycodone 2.5 mg, Robaxin for pain control - Home dose Eliquis  for DVT prophylaxis - No additional need for Vit D supplementation  Follow - up plan: 2 weeks after d/c for wound check and repeat x-rays   Contact information:  Katheryne Pane MD, [name] PA-C. After hours and holidays please check [email or web address] for group call information for [facility]   Edilia Gordon, PA-C [phone] (office) [email or web address]

## 2024-04-30 NOTE — Discharge Instructions (Signed)
 Orthopaedic Trauma Service Discharge Instructions   General Discharge Instructions  WEIGHT BEARING STATUS:weightbearing as tolerated  RANGE OF MOTION/ACTIVITY: Unrestricted range of motion  Wound Care: You may remove your surgical dressing on post op day 2 (Friday 04/30/24). Incisions can be left open to air if there is no drainage. Once the incision is completely dry and without drainage, it may be left open to air out.  Showering may begin post op day 3 (Saturday 05/01/24).  Clean incision gently with soap and water.  DVT/PE prophylaxis: home dose Eliquis   Diet: as you were eating previously.  Can use over the counter stool softeners and bowel preparations, such as Miralax, to help with bowel movements.  Narcotics can be constipating.  Be sure to drink plenty of fluids  PAIN MEDICATION USE AND EXPECTATIONS  You have likely been given narcotic medications to help control your pain.  After a traumatic event that results in an fracture (broken bone) with or without surgery, it is ok to use narcotic pain medications to help control one's pain.  We understand that everyone responds to pain differently and each individual patient will be evaluated on a regular basis for the continued need for narcotic medications. Ideally, narcotic medication use should last no more than 6-8 weeks (coinciding with fracture healing).   As a patient it is your responsibility as well to monitor narcotic medication use and report the amount and frequency you use these medications when you come to your office visit.   We would also advise that if you are using narcotic medications, you should take a dose prior to therapy to maximize you participation.  IF YOU ARE ON NARCOTIC MEDICATIONS IT IS NOT PERMISSIBLE TO OPERATE A MOTOR VEHICLE (MOTORCYCLE/CAR/TRUCK/MOPED) OR HEAVY MACHINERY DO NOT MIX NARCOTICS WITH OTHER CNS (CENTRAL NERVOUS SYSTEM) DEPRESSANTS SUCH AS ALCOHOL   POST-OPERATIVE OPIOID TAPER INSTRUCTIONS: It  is important to wean off of your opioid medication as soon as possible. If you do not need pain medication after your surgery it is ok to stop day one. Opioids include: Codeine, Hydrocodone(Norco, Vicodin), Oxycodone(Percocet, oxycontin) and hydromorphone amongst others.  Long term and even short term use of opiods can cause: Increased pain response Dependence Constipation Depression Respiratory depression And more.  Withdrawal symptoms can include Flu like symptoms Nausea, vomiting And more Techniques to manage these symptoms Hydrate well Eat regular healthy meals Stay active Use relaxation techniques(deep breathing, meditating, yoga) Do Not substitute Alcohol  to help with tapering If you have been on opioids for less than two weeks and do not have pain than it is ok to stop all together.  Plan to wean off of opioids This plan should start within one week post op of your fracture surgery  Maintain the same interval or time between taking each dose and first decrease the dose.  Cut the total daily intake of opioids by one tablet each day Next start to increase the time between doses. The last dose that should be eliminated is the evening dose.    STOP SMOKING OR USING NICOTINE PRODUCTS!!!!  As discussed nicotine severely impairs your body's ability to heal surgical and traumatic wounds but also impairs bone healing.  Wounds and bone heal by forming microscopic blood vessels (angiogenesis) and nicotine is a vasoconstrictor (essentially, shrinks blood vessels).  Therefore, if vasoconstriction occurs to these microscopic blood vessels they essentially disappear and are unable to deliver necessary nutrients to the healing tissue.  This is one modifiable factor that you can do to  dramatically increase your chances of healing your injury.  (This means no smoking, no nicotine gum, patches, etc)  DO NOT USE NONSTEROIDAL ANTI-INFLAMMATORY DRUGS (NSAID'S)  Using products such as Advil  (ibuprofen), Aleve (naproxen), Motrin (ibuprofen) for additional pain control during fracture healing can delay and/or prevent the healing response.  If you would like to take over the counter (OTC) medication, Tylenol  (acetaminophen ) is ok.  However, some narcotic medications that are given for pain control contain acetaminophen  as well. Therefore, you should not exceed more than 4000 mg of tylenol  in a day if you do not have liver disease.  Also note that there are may OTC medicines, such as cold medicines and allergy medicines that my contain tylenol  as well.  If you have any questions about medications and/or interactions please ask your doctor/PA or your pharmacist.      ICE AND ELEVATE INJURED/OPERATIVE EXTREMITY  Using ice and elevating the injured extremity above your heart can help with swelling and pain control.  Icing in a pulsatile fashion, such as 20 minutes on and 20 minutes off, can be followed.    Do not place ice directly on skin. Make sure there is a barrier between to skin and the ice pack.    Using frozen items such as frozen peas works well as the conform nicely to the are that needs to be iced.  USE AN ACE WRAP OR TED HOSE FOR SWELLING CONTROL  In addition to icing and elevation, Ace wraps or TED hose are used to help limit and resolve swelling.  It is recommended to use Ace wraps or TED hose until you are informed to stop.    When using Ace Wraps start the wrapping distally (farthest away from the body) and wrap proximally (closer to the body)   Example: If you had surgery on your leg or thing and you do not have a splint on, start the ace wrap at the toes and work your way up to the thigh        If you had surgery on your upper extremity and do not have a splint on, start the ace wrap at your fingers and work your way up to the upper arm  CALL THE OFFICE FOR MEDICATION REFILLS OR WITH ANY QUESTIONS/CONCERNS: [phone]   VISIT OUR WEBSITE FOR ADDITIONAL INFORMATION:  [email or web address]   Discharge Wound Care Instructions  Do NOT apply any ointments, solutions or lotions to pin sites or surgical wounds.  These prevent needed drainage and even though solutions like hydrogen peroxide kill bacteria, they also damage cells lining the pin sites that help fight infection.  Applying lotions or ointments can keep the wounds moist and can cause them to breakdown and open up as well. This can increase the risk for infection. When in doubt call the office.   If any drainage is noted, use foam dressings - These dressing supplies should be available at local medical supply stores [facility], [facility], etc) as well as Insurance claims handler ([facility], [facility], [employer], etc)  Once the incision is completely dry and without drainage, it may be left open to air out.  Showering may begin 36-48 hours later.  Cleaning gently with soap and water.   Call office for the following: Temperature greater than 101F Persistent nausea and vomiting Severe uncontrolled pain Redness, tenderness, or signs of infection (pain, swelling, redness, odor or green/yellow discharge around the site) Difficulty breathing, headache or visual disturbances Hives Persistent dizziness or light-headedness Extreme fatigue Any other  questions or concerns you may have after discharge  In an emergency, call 911 or go to an Emergency Department at a nearby hospital  OTHER HELPFUL INFORMATION  If you had a block, it will wear off between 8-24 hrs postop typically.  This is period when your pain may go from nearly zero to the pain you would have had postop without the block.  This is an abrupt transition but nothing dangerous is happening.  You may take an extra dose of narcotic when this happens.  You should wean off your narcotic medicines as soon as you are able.  Most patients will be off or using minimal narcotics before their first postop appointment.   We suggest you use the pain medication the  first night prior to going to bed, in order to ease any pain when the anesthesia wears off. You should avoid taking pain medications on an empty stomach as it will make you nauseous.  Do not drink alcoholic beverages or take illicit drugs when taking pain medications.  In most states it is against the law to drive while you are in a splint or sling.  And certainly against the law to drive while taking narcotics.  You may return to work/school in the next couple of days when you feel up to it.   Pain medication may make you constipated.  Below are a few solutions to try in this order: Decrease the amount of pain medication if you aren't having pain. Drink lots of decaffeinated fluids. Drink prune juice and/or each dried prunes  If the first 3 don't work start with additional solutions Take Colace - an over-the-counter stool softener Take Senokot - an over-the-counter laxative Take Miralax  - a stronger over-the-counter laxative

## 2024-04-30 NOTE — TOC Initial Note (Signed)
 Transition of Care [facility]) - Initial/Assessment Note    Patient Details  Name: Monica Curry MRN: [id number] Date of Birth: [birth year]  Transition of Care [facility]) CM/SW Contact:    Elspeth Hals, LCSW Phone Number: 04/30/2024, 1:38 PM  Clinical Narrative:    CSW met with pt and daughter [name] regarding PT recommendation for SNF.  Pt agreeable to SNF, medicare choice document provided, they are requesting [facility] or [facility].  Permission given to send out referral in the hub.  Permission given to speak with daughter [name].  Pt from home alone, no current services.  Referral sent out in hub for SNF. PASSR requires additional info.                 Expected Discharge Plan: Skilled Nursing Facility Barriers to Discharge: Continued Medical Work up, SNF Pending bed offer   Patient Goals and CMS Choice Patient states their goals for this hospitalization and ongoing recovery are:: live independent CMS Medicare.gov Compare Post Acute Care list provided to:: Patient Represenative (must comment) (daughter [name]) Choice offered to / list presented to : Patient, Adult Children      Expected Discharge Plan and Services In-house Referral: Clinical Social Work   Post Acute Care Choice: Skilled Nursing Facility Living arrangements for the past 2 months: Single Family Home                                      Prior Living Arrangements/Services Living arrangements for the past 2 months: Single Family Home Lives with:: Self Patient language and need for interpreter reviewed:: Yes Do you feel safe going back to the place where you live?: Yes      Need for Family Participation in Patient Care: Yes (Comment) Care giver support system in place?: Yes (comment) Current home services: Other (comment) (no current services) Criminal Activity/Legal Involvement Pertinent to Current Situation/Hospitalization: No - Comment as needed  Activities of Daily Living   ADL Screening (condition at  time of admission) Independently performs ADLs?: No Does the patient have a NEW difficulty with bathing/dressing/toileting/self-feeding that is expected to last >3 days?: Yes (Initiates electronic notice to provider for possible OT consult) Does the patient have a NEW difficulty with getting in/out of bed, walking, or climbing stairs that is expected to last >3 days?: Yes (Initiates electronic notice to provider for possible PT consult) Does the patient have a NEW difficulty with communication that is expected to last >3 days?: No Is the patient deaf or have difficulty hearing?: No Does the patient have difficulty seeing, even when wearing glasses/contacts?: No Does the patient have difficulty concentrating, remembering, or making decisions?: No  Permission Sought/Granted Permission sought to share information with : Family Supports Permission granted to share information with : Yes, Verbal Permission Granted  Share Information with NAME: daughter [name]  Permission granted to share info w AGENCY: SNF        Emotional Assessment Appearance:: Appears stated age Attitude/Demeanor/Rapport: Engaged Affect (typically observed): Appropriate, Pleasant Orientation: : Oriented to Self, Oriented to Place, Oriented to  Time, Oriented to Situation      Admission diagnosis:  Hip fracture (HCC) [S72.009A] Closed fracture of left hip, initial encounter (HCC) [S72.002A] Fall, initial encounter [W19.XXXA] Closed Colles' fracture of left radius, initial encounter [S52.532A] Hypertension, unspecified type [I10] Patient Active Problem List   Diagnosis Date Noted   Fracture, Colles, left, closed 04/28/2024   Hip fracture (HCC)  04/27/2024   GAD (generalized anxiety disorder) 04/01/2024   Thyroid  nodule 01/10/2022   Anxiety 08/10/2021   Aortic atherosclerosis (HCC) - CT 06/2021 06/15/2021   Bilateral renal cysts 07/06/2020   Paroxysmal supraventricular tachycardia (HCC) 10/20/2019   History of skin  cancer (basal cell) 06/16/2019   Complete right bundle branch block 06/16/2019   B12 deficiency 06/15/2019   History of TIA (transient ischemic attack) 10/23/2017   Chronic kidney disease, stage 3b (HCC) 07/21/2015   Medication management 07/04/2014   Essential hypertension 11/25/2013   Hyperlipidemia, mixed 11/25/2013   Abnormal glucose 11/25/2013   Vitamin D  deficiency 11/25/2013   Essential tremor 11/25/2013   History of endometrial cancer    Hx/o DVT/PE    Urethral prolapse    Osteoporosis 10/10/2011   PCP:  Christel Cousins, MD Pharmacy:   [id number] - [address], [place] - [address] AT [facility] [address] [place] [address] Phone: [phone] Fax: [phone]  [id number] - [place], [place] - [address] AT [facility] OF [address] & [address] [facility] [address] Phone: [phone] Fax: [phone]     Social Drivers of Health (SDOH) Social History: SDOH Screenings   Food Insecurity: No Food Insecurity (04/27/2024)  Housing: Low Risk  (04/27/2024)  Transportation Needs: No Transportation Needs (04/27/2024)  Utilities: Not At Risk (04/27/2024)  Alcohol  Screen: Low Risk  (03/28/2024)  Depression (PHQ2-9): Low Risk  (04/01/2024)  Financial Resource Strain: Low Risk  (03/28/2024)  Physical Activity: Unknown (03/28/2024)  Social Connections: Moderately Integrated (04/27/2024)  Stress: Stress Concern Present (03/28/2024)  Tobacco Use: Low Risk  (04/28/2024)   SDOH Interventions:     Readmission Risk Interventions     No data to display

## 2024-04-30 NOTE — Plan of Care (Signed)
   Problem: Activity: Goal: Risk for activity intolerance will decrease Outcome: Progressing   Problem: Nutrition: Goal: Adequate nutrition will be maintained Outcome: Progressing   Problem: Coping: Goal: Level of anxiety will decrease Outcome: Progressing   Problem: Pain Managment: Goal: General experience of comfort will improve and/or be controlled Outcome: Progressing

## 2024-04-30 NOTE — Progress Notes (Signed)
 Physical Therapy Treatment Patient Details Name: Monica Curry MRN: [id number] DOB: May 23, 1931 Today's Date: 04/30/2024   History of Present Illness Patient is a 89 year old with mechanical fall resulting in left hip fracture and left distal radius fracture. S/p ORIF of L hip and non-operative management of LUE. PMH: Anemia, c-diff, CKD, HTN, osteoporosis, PE, TIA.    PT Comments  Pt received in supine and agreeable to session. Pt demonstrates improved ability to sit to EOB with min A and cues for technique. Pt able to stand x2 with min A +2 for safety and balance. Pt demonstrates improved standing tolerance and is able to take a few lateral steps toward [facility], however slides feet due to difficulty weight shifting from L hip pain. Pt requires increased assist to return to supine due to pain and weakness. Clarified that pt is able to elbow WB with LUE as long as pt can tolerate, so plan to trial platform RW next session. Pt continues to benefit from PT services to progress toward functional mobility goals.     If plan is discharge home, recommend the following: Two people to help with walking and/or transfers;Two people to help with bathing/dressing/bathroom;Assist for transportation;Help with stairs or ramp for entrance;Assistance with cooking/housework   Can travel by private vehicle     No  Equipment Recommendations  Wheelchair (measurements PT)    Recommendations for Other Services       Precautions / Restrictions Precautions Precautions: Fall Required Braces or Orthoses: Splint/Cast Splint/Cast: LUE sugartong splint Restrictions Weight Bearing Restrictions Per Provider Order: Yes LUE Weight Bearing Per Provider Order: Weight bear through elbow only LLE Weight Bearing Per Provider Order: Weight bearing as tolerated Other Position/Activity Restrictions: Per secure chat with [name] on 5/23 pt can elbow WB if tolerated, however if too painful needs to be full NWB.      Mobility  Bed Mobility Overal bed mobility: Needs Assistance Bed Mobility: Supine to Sit, Sit to Supine     Supine to sit: Min assist Sit to supine: Max assist, +2 for physical assistance   General bed mobility comments: cues for sequencing and technique. Min A for LLE management and trunk elevation. Max A +2 for BLE and trunk management to return to supine    Transfers Overall transfer level: Needs assistance Equipment used: 2 person hand held assist Transfers: Sit to/from Stand Sit to Stand: Min assist, +2 physical assistance           General transfer comment: From low EOB with min A +2 for power up and anterior weight shift. Light min A +2 for second stand with improved anterior weight shift. Attempted lateral steps with pt able to slide feet towards [employer]    Ambulation/Gait               General Gait Details: unable due to L hip pain   Stairs             Wheelchair Mobility     Tilt Bed    Modified Rankin (Stroke Patients Only)       Balance Overall balance assessment: Needs assistance Sitting-balance support: Feet supported Sitting balance-Leahy Scale: Fair Sitting balance - Comments: sitting EOB   Standing balance support: Single extremity supported, During functional activity Standing balance-Leahy Scale: Poor Standing balance comment: with HHA and +2 support for safety  Communication Communication Communication: No apparent difficulties  Cognition Arousal: Alert Behavior During Therapy: WFL for tasks assessed/performed   PT - Cognitive impairments: No apparent impairments                         Following commands: Impaired Following commands impaired: Follows one step commands with increased time    Cueing Cueing Techniques: Verbal cues  Exercises      General Comments        Pertinent Vitals/Pain Pain Assessment Pain Assessment: Faces Faces Pain Scale: Hurts little  more Pain Location: L hip with mobility Pain Descriptors / Indicators: Aching, Discomfort, Guarding, Grimacing Pain Intervention(s): Monitored during session, Limited activity within patient's tolerance     PT Goals (current goals can now be found in the care plan section) Acute Rehab PT Goals Patient Stated Goal: to get better PT Goal Formulation: With patient Time For Goal Achievement: 05/13/24 Progress towards PT goals: Progressing toward goals    Frequency    Min 2X/week       AM-PAC PT "6 Clicks" Mobility   Outcome Measure  Help needed turning from your back to your side while in a flat bed without using bedrails?: A Little Help needed moving from lying on your back to sitting on the side of a flat bed without using bedrails?: A Lot Help needed moving to and from a bed to a chair (including a wheelchair)?: Total Help needed standing up from a chair using your arms (e.g., wheelchair or bedside chair)?: Total Help needed to walk in hospital room?: Total Help needed climbing 3-5 steps with a railing? : Total 6 Click Score: 9    End of Session Equipment Utilized During Treatment: Gait belt Activity Tolerance: Patient tolerated treatment well Patient left: with call bell/phone within reach;in bed;with SCD's reapplied Nurse Communication: Mobility status PT Visit Diagnosis: Difficulty in walking, not elsewhere classified (R26.2);Other abnormalities of gait and mobility (R26.89)     Time: 5284-1324 PT Time Calculation (min) (ACUTE ONLY): 18 min  Charges:    $Therapeutic Activity: 8-22 mins PT General Charges $$ ACUTE PT VISIT: 1 Visit                     Michaelle Adolphus, PTA Acute Rehabilitation Services Secure Chat Preferred  [phone]    Michaelle Adolphus 04/30/2024, 11:45 AM

## 2024-05-01 DIAGNOSIS — N179 Acute kidney failure, unspecified: Secondary | ICD-10-CM | POA: Diagnosis not present

## 2024-05-01 DIAGNOSIS — I10 Essential (primary) hypertension: Secondary | ICD-10-CM | POA: Diagnosis not present

## 2024-05-01 LAB — BASIC METABOLIC PANEL WITH GFR
Anion gap: 9 (ref 5–15)
BUN: 31 mg/dL — ABNORMAL HIGH (ref 8–23)
CO2: 21 mmol/L — ABNORMAL LOW (ref 22–32)
Calcium: 8.2 mg/dL — ABNORMAL LOW (ref 8.9–10.3)
Chloride: 106 mmol/L (ref 98–111)
Creatinine, Ser: 1.2 mg/dL — ABNORMAL HIGH (ref 0.44–1.00)
GFR, Estimated: 43 mL/min — ABNORMAL LOW (ref >60)
Glucose, Bld: 105 mg/dL — ABNORMAL HIGH (ref 70–99)
Potassium: 5 mmol/L (ref 3.5–5.1)
Sodium: 136 mmol/L (ref 135–145)

## 2024-05-01 LAB — CBC
HCT: 37.5 % (ref 36.0–46.0)
Hemoglobin: 12.2 g/dL (ref 12.0–15.0)
MCH: 28.1 pg (ref 26.0–34.0)
MCHC: 32.5 g/dL (ref 30.0–36.0)
MCV: 86.4 fL (ref 80.0–100.0)
Platelets: 416 10*3/uL — ABNORMAL HIGH (ref 150–400)
RBC: 4.34 MIL/uL (ref 3.87–5.11)
RDW: 14.5 % (ref 11.5–15.5)
WBC: 12 10*3/uL — ABNORMAL HIGH (ref 4.0–10.5)
nRBC: 0 % (ref 0.0–0.2)

## 2024-05-01 MED ORDER — HYDRALAZINE HCL 20 MG/ML IJ SOLN
5.0000 mg | Freq: Four times a day (QID) | INTRAMUSCULAR | Status: DC | PRN
  Administered 2024-05-02: 5 mg via INTRAVENOUS
  Filled 2024-05-01: qty 1

## 2024-05-01 MED ORDER — METHOCARBAMOL 500 MG PO TABS
500.0000 mg | ORAL_TABLET | Freq: Every day | ORAL | Status: DC
  Administered 2024-05-01 – 2024-05-02 (×2): 500 mg via ORAL
  Filled 2024-05-01 (×3): qty 1

## 2024-05-01 NOTE — Plan of Care (Signed)

## 2024-05-01 NOTE — Progress Notes (Signed)
 [facility] PROGRESS NOTE   Monica Curry WUJ:[id number] DOB: Apr 03, 1931 DOA: 04/27/2024  PCP: Christel Cousins, MD  Brief History: 89 y.o. 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  Patient had a mechanical fall resulting in left hip fracture and left distal radius fracture.  She was hospitalized for further management.    Consultants: Orthopedics.  Hand surgeon  Procedures: ORIF left hip    Subjective/Interval History: Patient complains of restless leg at night.  Requesting muscle relaxant at bedtime.  Otherwise she feels well.  Left hip and left wrist pain is reasonably well-controlled.  Agrees to go to rehab.    Assessment/Plan:  Left hip fracture Underwent surgery on 5/21.  Pain is reasonably well-controlled.  Seen by physical therapy.  Skilled nursing facility is recommended.  TOC is following.    Left distal radius fracture Patient underwent closed reduction of the left wrist in the emergency department with improved length and alignment of the distal radius.  She was seen by hand surgery.  No plans for surgical intervention at this time.  Outpatient follow-up.  Pain control.  Acute kidney injury Baseline creatinine seems to be around 1.2. BUN and creatinine noted to be higher few days ago.  Creatinine peaked at 1.68.  Was gently hydrated.  Improved to 1.20 today.  Leukocytosis Most likely reactive.  She was afebrile.  WBC has improved this morning.  UA was unremarkable.    Essential hypertension Elevated blood pressure readings likely due to pain issues.  Started back on propranolol .   Can use as needed agents for elevated blood pressure.  Acute urinary retention Required Foley catheter for about 48 hours.  Removed on 5/23.  Has been able to void on her own.    Essential tremors Propranolol .  History of pulmonary embolism She has had more than 1 episode  of same.  Is on lifelong anticoagulation.  Apixaban  was held for surgery.  Has been resumed.   Last PE at least in our system was in 2022.  Thyroid  Nodule Patient is aware. Has had previous work up for same. Outpatient management.  DVT Prophylaxis: On apixaban  Code Status: Full code Family Communication: Discussed with patient Disposition Plan: SNF    Medications: Scheduled:  acetaminophen   650 mg Oral Q6H   apixaban   5 mg Oral BID   atorvastatin   10 mg Oral Daily   Chlorhexidine Gluconate Cloth  6 each Topical Daily   docusate sodium  100 mg Oral BID   escitalopram   5 mg Oral QHS   hydrOXYzine   25 mg Oral Daily   magnesium  oxide  400 mg Oral BID   methocarbamol  500 mg Oral QHS   polyethylene glycol  17 g Oral Daily   propranolol   40 mg Oral QID   Continuous:   PRN:HYDROmorphone (DILAUDID) injection, methocarbamol, metoCLOPramide **OR** metoCLOPramide (REGLAN) injection, ondansetron (ZOFRAN) IV, oxyCODONE  Antibiotics: Anti-infectives (From admission, onward)    Start     Dose/Rate Route Frequency Ordered Stop   04/29/24 0600  ceFAZolin (ANCEF) IVPB 2g/100 mL premix        2 g 200 mL/hr over 30 Minutes Intravenous On call to O.R. 04/28/24 1620 04/28/24 1705   04/29/24 0000  ceFAZolin (ANCEF) IVPB 2g/100 mL premix        2 g 200 mL/hr over 30 Minutes Intravenous Every 8 hours 04/28/24 1905 04/29/24 1639   04/28/24 2200  ceFAZolin (ANCEF) IVPB 2g/100  mL premix  Status:  Discontinued        2 g 200 mL/hr over 30 Minutes Intravenous Every 8 hours 04/28/24 1845 04/28/24 1905       Objective:  Vital Signs  Vitals:   04/30/24 1405 04/30/24 2014 05/01/24 0438 05/01/24 0855  BP: 131/70 132/61 (!) 197/70 (!) 198/77  Pulse: 63 72 (!) 55 (!) 57  Resp:  16 16 18   Temp: 97.7 F (36.5 C) 98.6 F (37 C) 98.5 F (36.9 C) 97.8 F (36.6 C)  TempSrc: Oral  Oral Oral  SpO2: 95% 98% 97% 98%  Weight:      Height:        Intake/Output Summary (Last 24 hours) at 05/01/2024  1028 Last data filed at 04/30/2024 1400 Gross per 24 hour  Intake 120 ml  Output --  Net 120 ml   Filed Weights   04/27/24 1702 04/28/24 1450  Weight: 63.5 kg 62.6 kg    General appearance: Awake alert.  In no distress Resp: Clear to auscultation bilaterally.  Normal effort Cardio: S1-S2 is normal regular.  No S3-S4.  No rubs murmurs or bruit GI: Abdomen is soft.  Nontender nondistended.  Bowel sounds are present normal.  No masses organomegaly   Lab Results:  Data Reviewed: I have personally reviewed following labs and reports of the imaging studies  CBC: Recent Labs  Lab 04/27/24 1709 04/27/24 1724 04/29/24 0423 04/30/24 0737 05/01/24 0424  WBC  --  8.8 11.0* 17.5* 12.0*  NEUTROABS  --  5.4  --   --   --   HGB 17.0* 15.2* 13.5 13.2 12.2  HCT 50.0* 46.9* 40.5 40.3 37.5  MCV  --  87.0 86.9 85.9 86.4  PLT  --  503* 363 412* 416*    Basic Metabolic Panel: Recent Labs  Lab 04/27/24 1709 04/27/24 1724 04/29/24 0423 04/30/24 0737 05/01/24 0424  NA 139 136 132* 135 136  K 4.5 4.7 4.6 4.4 5.0  CL 101 103 98 102 106  CO2  --  26 22 24  21*  GLUCOSE 129* 117* 228* 133* 105*  BUN 29* 24* 30* 28* 31*  CREATININE 1.20* 1.28* 1.68* 1.35* 1.20*  CALCIUM   --  9.3 8.4* 8.4* 8.2*    GFR: Estimated Creatinine Clearance: 26.4 mL/min (A) (by C-G formula based on SCr of 1.2 mg/dL (H)).  Coagulation Profile: Recent Labs  Lab 04/27/24 1724  INR 1.6*     Recent Results (from the past 240 hours)  Surgical pcr screen     Status: None   Collection Time: 04/27/24  8:41 PM   Specimen: Nasal Mucosa; Nasal Swab  Result Value Ref Range Status   MRSA, PCR NEGATIVE NEGATIVE Final   Staphylococcus aureus NEGATIVE NEGATIVE Final    Comment: (NOTE) The Xpert SA Assay (FDA approved for NASAL specimens in patients 9 years of age and older), is one component of a comprehensive surveillance program. It is not intended to diagnose infection nor to guide or monitor  treatment. Performed at [facility], [address], [place], [place] [address]   MRSA Next Gen by PCR, Nasal     Status: None   Collection Time: 04/28/24 10:42 AM   Specimen: Nasal Mucosa; Nasal Swab  Result Value Ref Range Status   MRSA by PCR Next Gen NOT DETECTED NOT DETECTED Final    Comment: (NOTE) The GeneXpert MRSA Assay (FDA approved for NASAL specimens only), is one component of a comprehensive MRSA colonization surveillance program.  It is not intended to diagnose MRSA infection nor to guide or monitor treatment for MRSA infections. Test performance is not FDA approved in patients less than 74 years old. Performed at [facility], [address], [place], [place] [address]       Radiology Studies: No results found.      LOS: 4 days   Monica Curry [employer] on [email or web address]  05/01/2024, 10:28 AM

## 2024-05-02 DIAGNOSIS — G2581 Restless legs syndrome: Secondary | ICD-10-CM

## 2024-05-02 DIAGNOSIS — I10 Essential (primary) hypertension: Secondary | ICD-10-CM | POA: Diagnosis not present

## 2024-05-02 LAB — BASIC METABOLIC PANEL WITH GFR
Anion gap: 6 (ref 5–15)
BUN: 24 mg/dL — ABNORMAL HIGH (ref 8–23)
CO2: 29 mmol/L (ref 22–32)
Calcium: 8.5 mg/dL — ABNORMAL LOW (ref 8.9–10.3)
Chloride: 102 mmol/L (ref 98–111)
Creatinine, Ser: 1.22 mg/dL — ABNORMAL HIGH (ref 0.44–1.00)
GFR, Estimated: 42 mL/min — ABNORMAL LOW (ref >60)
Glucose, Bld: 99 mg/dL (ref 70–99)
Potassium: 4.4 mmol/L (ref 3.5–5.1)
Sodium: 137 mmol/L (ref 135–145)

## 2024-05-02 MED ORDER — PRAMIPEXOLE DIHYDROCHLORIDE 0.125 MG PO TABS
0.1250 mg | ORAL_TABLET | Freq: Every day | ORAL | Status: DC
  Administered 2024-05-02 – 2024-05-03 (×2): 0.125 mg via ORAL
  Filled 2024-05-02 (×3): qty 1

## 2024-05-02 NOTE — Progress Notes (Signed)
 Mobility Specialist: Progress Note   05/02/24 1536  Mobility  Activity Ambulated with assistance in room  Level of Assistance Moderate assist, patient does 50-74%  Assistive Device Other (Comment) (HHA)  Distance Ambulated (ft) 10 ft  LUE Weight Bearing Per Provider Order Weight bear through elbow only  LLE Weight Bearing Per Provider Order WBAT  Activity Response Tolerated well  Mobility Referral Yes  Mobility visit 1 Mobility  Mobility Specialist Start Time (ACUTE ONLY) 1430  Mobility Specialist Stop Time (ACUTE ONLY) 1440  Mobility Specialist Time Calculation (min) (ACUTE ONLY) 10 min    Pt received in chair, agreeable to mobility session. MinA for STS. ModA w/ HHA for ambulation. Ambulated ~102ft in the room. Returned to bed. C/o LLE incision pain and LUE pain. Left in bed with all needs met, call bell in reach.   Deloria Fetch Mobility Specialist Please contact via SecureChat or Rehab office at [phone]

## 2024-05-02 NOTE — Progress Notes (Signed)
 Mobility Specialist: Progress Note   05/02/24 1540  Mobility  Activity Transferred to/from [facility]  Level of Assistance Minimal assist, patient does 75% or more  Assistive Device Other (Comment) (HHA)  LUE Weight Bearing Per Provider Order Weight bear through elbow only  LLE Weight Bearing Per Provider Order WBAT  Activity Response Tolerated well  Mobility Referral Yes  Mobility visit 1 Mobility  Mobility Specialist Start Time (ACUTE ONLY) 1309  Mobility Specialist Stop Time (ACUTE ONLY) 1315  Mobility Specialist Time Calculation (min) (ACUTE ONLY) 6 min    Pt received on BSC. MinA needed for pericare in standing. MinA for STS and stand pivot to chair. Left in chair with all needs met, call bell in reach.   Deloria Fetch Mobility Specialist Please contact via SecureChat or Rehab office at [phone]

## 2024-05-02 NOTE — Progress Notes (Signed)
 [facility] PROGRESS NOTE   Monica Curry UJW:[id number] DOB: December 20, 1930 DOA: 04/27/2024  PCP: Christel Cousins, MD  Brief History: 89 y.o. 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  Patient had a mechanical fall resulting in left hip fracture and left distal radius fracture.  She was hospitalized for further management.    Consultants: Orthopedics.  Hand surgeon  Procedures: ORIF left hip    Subjective/Interval History: Patient is quite a bit bothered by her restless legs.  She is requesting medications for same.  She mentions that her brother takes pramipexole and is effective for him.  Side effect profile discussed with the patient and she wants to try it tonight.  No other complaints offered.  Looking forward to going to rehab soon.     Assessment/Plan:  Left hip fracture Underwent surgery on 5/21.  Pain is reasonably well-controlled.  Seen by physical therapy.  Skilled nursing facility is recommended.  TOC is following.    Left distal radius fracture Patient underwent closed reduction of the left wrist in the emergency department with improved length and alignment of the distal radius.  She was seen by hand surgery.  No plans for surgical intervention at this time.  Outpatient follow-up.  Pain control.  Acute kidney injury Baseline creatinine seems to be around 1.2. BUN and creatinine noted to be higher few days ago.  Creatinine peaked at 1.68.  Was gently hydrated.  Creatinine is back to baseline.  Leukocytosis Most likely reactive.  She was afebrile.  WBC has improved.  UA was unremarkable.    Essential hypertension Elevated blood pressure readings likely due to pain issues.  Started back on propranolol .   Can use as needed agents for elevated blood pressure. Her pain and restless legs probably contributing to elevated blood pressure readings overnight.  Patient  does not want to try new medications at this time.  Acute urinary retention Required Foley catheter for about 48 hours.  Removed on 5/23.  Has been able to void on her own.    Essential tremors Propranolol .  Restless leg Wants to try pramipexole which has been ordered.  History of pulmonary embolism She has had more than 1 episode of same.  Is on lifelong anticoagulation.  Apixaban  was held for surgery.  Has been resumed.   Last PE at least in our system was in 2022.  Thyroid  Nodule Patient is aware. Has had previous work up for same. Outpatient management.  DVT Prophylaxis: On apixaban  Code Status: Full code Family Communication: Discussed with patient Disposition Plan: Remains medically stable for SNF.    Medications: Scheduled:  acetaminophen   650 mg Oral Q6H   apixaban   5 mg Oral BID   atorvastatin   10 mg Oral Daily   docusate sodium  100 mg Oral BID   escitalopram   5 mg Oral QHS   hydrOXYzine   25 mg Oral Daily   magnesium  oxide  400 mg Oral BID   methocarbamol  500 mg Oral QHS   polyethylene glycol  17 g Oral Daily   pramipexole  0.125 mg Oral QHS   propranolol   40 mg Oral QID   Continuous:   PRN:hydrALAZINE, HYDROmorphone (DILAUDID) injection, methocarbamol, metoCLOPramide **OR** metoCLOPramide (REGLAN) injection, ondansetron (ZOFRAN) IV, oxyCODONE  Antibiotics: Anti-infectives (From admission, onward)    Start     Dose/Rate Route Frequency Ordered Stop   04/29/24 0600  ceFAZolin (ANCEF) IVPB 2g/100 mL premix  2 g 200 mL/hr over 30 Minutes Intravenous On call to O.R. 04/28/24 1620 04/28/24 1705   04/29/24 0000  ceFAZolin (ANCEF) IVPB 2g/100 mL premix        2 g 200 mL/hr over 30 Minutes Intravenous Every 8 hours 04/28/24 1905 04/29/24 1639   04/28/24 2200  ceFAZolin (ANCEF) IVPB 2g/100 mL premix  Status:  Discontinued        2 g 200 mL/hr over 30 Minutes Intravenous Every 8 hours 04/28/24 1845 04/28/24 1905       Objective:  Vital  Signs  Vitals:   05/01/24 1420 05/01/24 1938 05/02/24 0451 05/02/24 0737  BP: (!) 144/56 133/64 (!) 193/76 (!) 153/64  Pulse: 64 66 69 64  Resp: 14 16 16 18   Temp: 98.3 F (36.8 C) 98.7 F (37.1 C) 97.6 F (36.4 C) 99 F (37.2 C)  TempSrc: Oral   Oral  SpO2: 95% 97% 99% 100%  Weight:      Height:        Intake/Output Summary (Last 24 hours) at 05/02/2024 0923 Last data filed at 05/01/2024 2242 Gross per 24 hour  Intake 720 ml  Output 500 ml  Net 220 ml   Filed Weights   04/27/24 1702 04/28/24 1450  Weight: 63.5 kg 62.6 kg    General appearance: Awake alert.  In no distress Resp: Clear to auscultation bilaterally.  Normal effort Cardio: S1-S2 is normal regular.  No S3-S4.  No rubs murmurs or bruit GI: Abdomen is soft.  Nontender nondistended.  Bowel sounds are present normal.  No masses organomegaly   Lab Results:  Data Reviewed: I have personally reviewed following labs and reports of the imaging studies  CBC: Recent Labs  Lab 04/27/24 1709 04/27/24 1724 04/29/24 0423 04/30/24 0737 05/01/24 0424  WBC  --  8.8 11.0* 17.5* 12.0*  NEUTROABS  --  5.4  --   --   --   HGB 17.0* 15.2* 13.5 13.2 12.2  HCT 50.0* 46.9* 40.5 40.3 37.5  MCV  --  87.0 86.9 85.9 86.4  PLT  --  503* 363 412* 416*    Basic Metabolic Panel: Recent Labs  Lab 04/27/24 1724 04/29/24 0423 04/30/24 0737 05/01/24 0424 05/02/24 0537  NA 136 132* 135 136 137  K 4.7 4.6 4.4 5.0 4.4  CL 103 98 102 106 102  CO2 26 22 24  21* 29  GLUCOSE 117* 228* 133* 105* 99  BUN 24* 30* 28* 31* 24*  CREATININE 1.28* 1.68* 1.35* 1.20* 1.22*  CALCIUM  9.3 8.4* 8.4* 8.2* 8.5*    GFR: Estimated Creatinine Clearance: 25.9 mL/min (A) (by C-G formula based on SCr of 1.22 mg/dL (H)).  Coagulation Profile: Recent Labs  Lab 04/27/24 1724  INR 1.6*     Recent Results (from the past 240 hours)  Surgical pcr screen     Status: None   Collection Time: 04/27/24  8:41 PM   Specimen: Nasal Mucosa; Nasal Swab   Result Value Ref Range Status   MRSA, PCR NEGATIVE NEGATIVE Final   Staphylococcus aureus NEGATIVE NEGATIVE Final    Comment: (NOTE) The Xpert SA Assay (FDA approved for NASAL specimens in patients 38 years of age and older), is one component of a comprehensive surveillance program. It is not intended to diagnose infection nor to guide or monitor treatment. Performed at [facility], [address], [place], [place] [address]   MRSA Next Gen by PCR, Nasal     Status: None   Collection  Time: 04/28/24 10:42 AM   Specimen: Nasal Mucosa; Nasal Swab  Result Value Ref Range Status   MRSA by PCR Next Gen NOT DETECTED NOT DETECTED Final    Comment: (NOTE) The GeneXpert MRSA Assay (FDA approved for NASAL specimens only), is one component of a comprehensive MRSA colonization surveillance program. It is not intended to diagnose MRSA infection nor to guide or monitor treatment for MRSA infections. Test performance is not FDA approved in patients less than 72 years old. Performed at [facility], [address], [place], [place] [address]       Radiology Studies: No results found.      LOS: 5 days   Zayna Toste [employer] on [email or web address]  05/02/2024, 9:23 AM

## 2024-05-02 NOTE — Plan of Care (Signed)
  Problem: Education: Goal: Knowledge of General Education information will improve Description: Including pain rating scale, medication(s)/side effects and non-pharmacologic comfort measures Outcome: Progressing   Problem: Coping: Goal: Level of anxiety will decrease Outcome: Progressing   Problem: Pain Managment: Goal: General experience of comfort will improve and/or be controlled Outcome: Progressing

## 2024-05-02 NOTE — Progress Notes (Signed)
 Mobility Specialist: Progress Note   05/02/24 1538  Mobility  Activity Transferred to/from [facility]  Level of Assistance Minimal assist, patient does 75% or more  Assistive Device Other (Comment) (HHA)  LUE Weight Bearing Per Provider Order Weight bear through elbow only  LLE Weight Bearing Per Provider Order WBAT  Activity Response Tolerated well  Mobility Referral Yes  Mobility visit 1 Mobility  Mobility Specialist Start Time (ACUTE ONLY) 1250  Mobility Specialist Stop Time (ACUTE ONLY) 1303  Mobility Specialist Time Calculation (min) (ACUTE ONLY) 13 min    Pt received in bed, agreeable to mobility session. Requested to get on [facility]. MinA for bed mobility and stand pivot to [facility]. Left on [facility] with all needs met, call bell in reach.   Deloria Fetch Mobility Specialist Please contact via SecureChat or Rehab office at [phone]

## 2024-05-03 DIAGNOSIS — I10 Essential (primary) hypertension: Secondary | ICD-10-CM | POA: Diagnosis not present

## 2024-05-03 DIAGNOSIS — G2581 Restless legs syndrome: Secondary | ICD-10-CM | POA: Diagnosis not present

## 2024-05-03 LAB — GLUCOSE, CAPILLARY: Glucose-Capillary: 127 mg/dL — ABNORMAL HIGH (ref 70–99)

## 2024-05-03 NOTE — Progress Notes (Signed)
 [facility] PROGRESS NOTE   Monica Curry ZOX:[id number] DOB: November 30, 1931 DOA: 04/27/2024  PCP: Christel Cousins, MD  Brief History: 89 y.o. 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  Patient had a mechanical fall resulting in left hip fracture and left distal radius fracture.  She was hospitalized for further management.    Consultants: Orthopedics.  Hand surgeon  Procedures: ORIF left hip    Subjective/Interval History: Patient mentions that the Mirapex is helping with her restless leg.  She was able to sleep last night.  Wants to continue current dose.  No other complaints offered this morning.     Assessment/Plan:  Left hip fracture Underwent surgery on 5/21.  Pain is reasonably well-controlled.  Seen by physical therapy.  Skilled nursing facility is recommended.  TOC is following.    Left distal radius fracture Patient underwent closed reduction of the left wrist in the emergency department with improved length and alignment of the distal radius.  She was seen by hand surgery.  No plans for surgical intervention at this time.  Outpatient follow-up.  Pain control.  Acute kidney injury Baseline creatinine seems to be around 1.2. BUN and creatinine noted to be higher few days ago.  Creatinine peaked at 1.68.  Was gently hydrated.  Creatinine is back to baseline.  Leukocytosis Most likely reactive.  She was afebrile.  WBC has improved.  UA was unremarkable.    Essential hypertension Her pain and restless legs probably contributing to elevated blood pressure readings overnight.  Patient does not want to try new medications at this time. Continue propranolol  for now.  Due to her advanced age we will not strive for aggressive control to avoid hypotension.  Acute urinary retention Required Foley catheter for about 48 hours.  Removed on 5/23.  Has been able to void on her  own.    Essential tremors Propranolol .  Restless leg Seems to be tolerating low-dose Mirapex.  Symptoms have improved.Aaron Aas  History of pulmonary embolism She has had more than 1 episode of same.  Is on lifelong anticoagulation.  Apixaban  was held for surgery.  Has been resumed.   Last PE at least in our system was in 2022.  Thyroid  Nodule Patient is aware. Has had previous work up for same. Outpatient management.  DVT Prophylaxis: On apixaban  Code Status: Full code Family Communication: Discussed with patient Disposition Plan: Remains medically stable for SNF.    Medications: Scheduled:  acetaminophen   650 mg Oral Q6H   apixaban   5 mg Oral BID   atorvastatin   10 mg Oral Daily   docusate sodium  100 mg Oral BID   escitalopram   5 mg Oral QHS   hydrOXYzine   25 mg Oral Daily   magnesium  oxide  400 mg Oral BID   methocarbamol  500 mg Oral QHS   polyethylene glycol  17 g Oral Daily   pramipexole  0.125 mg Oral QHS   propranolol   40 mg Oral QID   Continuous:   PRN:hydrALAZINE, HYDROmorphone (DILAUDID) injection, methocarbamol, metoCLOPramide **OR** metoCLOPramide (REGLAN) injection, ondansetron (ZOFRAN) IV, oxyCODONE  Antibiotics: Anti-infectives (From admission, onward)    Start     Dose/Rate Route Frequency Ordered Stop   04/29/24 0600  ceFAZolin (ANCEF) IVPB 2g/100 mL premix        2 g 200 mL/hr over 30 Minutes Intravenous On call to O.R. 04/28/24 1620 04/28/24 1705   04/29/24 0000  ceFAZolin (ANCEF) IVPB  2g/100 mL premix        2 g 200 mL/hr over 30 Minutes Intravenous Every 8 hours 04/28/24 1905 04/29/24 1639   04/28/24 2200  ceFAZolin (ANCEF) IVPB 2g/100 mL premix  Status:  Discontinued        2 g 200 mL/hr over 30 Minutes Intravenous Every 8 hours 04/28/24 1845 04/28/24 1905       Objective:  Vital Signs  Vitals:   05/02/24 2010 05/03/24 0418 05/03/24 0502 05/03/24 0851  BP: (!) 129/55 (!) 181/72 (!) 168/74 136/78  Pulse: 65 64 64 80  Resp: 16 15 16 16    Temp: 98.3 F (36.8 C) 98.1 F (36.7 C) 98.4 F (36.9 C) 97.9 F (36.6 C)  TempSrc:   Oral Oral  SpO2: 99% 99% 98% 97%  Weight:      Height:        Intake/Output Summary (Last 24 hours) at 05/03/2024 1006 Last data filed at 05/03/2024 0700 Gross per 24 hour  Intake 480 ml  Output 850 ml  Net -370 ml   Filed Weights   04/27/24 1702 04/28/24 1450  Weight: 63.5 kg 62.6 kg    General appearance: Awake alert.  In no distress Resp: Clear to auscultation bilaterally.  Normal effort Cardio: S1-S2 is normal regular.  No S3-S4.  No rubs murmurs or bruit GI: Abdomen is soft.  Nontender nondistended.  Bowel sounds are present normal.  No masses organomegaly   Lab Results:  Data Reviewed: I have personally reviewed following labs and reports of the imaging studies  CBC: Recent Labs  Lab 04/27/24 1709 04/27/24 1724 04/29/24 0423 04/30/24 0737 05/01/24 0424  WBC  --  8.8 11.0* 17.5* 12.0*  NEUTROABS  --  5.4  --   --   --   HGB 17.0* 15.2* 13.5 13.2 12.2  HCT 50.0* 46.9* 40.5 40.3 37.5  MCV  --  87.0 86.9 85.9 86.4  PLT  --  503* 363 412* 416*    Basic Metabolic Panel: Recent Labs  Lab 04/27/24 1724 04/29/24 0423 04/30/24 0737 05/01/24 0424 05/02/24 0537  NA 136 132* 135 136 137  K 4.7 4.6 4.4 5.0 4.4  CL 103 98 102 106 102  CO2 26 22 24  21* 29  GLUCOSE 117* 228* 133* 105* 99  BUN 24* 30* 28* 31* 24*  CREATININE 1.28* 1.68* 1.35* 1.20* 1.22*  CALCIUM  9.3 8.4* 8.4* 8.2* 8.5*    GFR: Estimated Creatinine Clearance: 25.9 mL/min (A) (by C-G formula based on SCr of 1.22 mg/dL (H)).  Coagulation Profile: Recent Labs  Lab 04/27/24 1724  INR 1.6*     Recent Results (from the past 240 hours)  Surgical pcr screen     Status: None   Collection Time: 04/27/24  8:41 PM   Specimen: Nasal Mucosa; Nasal Swab  Result Value Ref Range Status   MRSA, PCR NEGATIVE NEGATIVE Final   Staphylococcus aureus NEGATIVE NEGATIVE Final    Comment: (NOTE) The Xpert SA Assay  (FDA approved for NASAL specimens in patients 72 years of age and older), is one component of a comprehensive surveillance program. It is not intended to diagnose infection nor to guide or monitor treatment. Performed at [facility], [address], [place], [place] [address]   MRSA Next Gen by PCR, Nasal     Status: None   Collection Time: 04/28/24 10:42 AM   Specimen: Nasal Mucosa; Nasal Swab  Result Value Ref Range Status   MRSA by PCR Next Gen  NOT DETECTED NOT DETECTED Final    Comment: (NOTE) The GeneXpert MRSA Assay (FDA approved for NASAL specimens only), is one component of a comprehensive MRSA colonization surveillance program. It is not intended to diagnose MRSA infection nor to guide or monitor treatment for MRSA infections. Test performance is not FDA approved in patients less than 26 years old. Performed at [facility], [address], [place], [place] [address]       Radiology Studies: No results found.      LOS: 6 days   Destini Cambre [employer] on [email or web address]  05/03/2024, 10:06 AM

## 2024-05-03 NOTE — TOC Progression Note (Signed)
 Transition of Care [facility]) - Progression Note    Patient Details  Name: Monica Curry MRN: [id number] Date of Birth: [birth year]  [facility] CM/SW Contact  Valley Gavia, LCSWA Phone Number: 05/03/2024, 2:24 PM  Clinical Narrative:     CSW spoke with pt's daughter, she accepts the bed offer for [facility]. CSW spoke with [place] at [place], she will hold a bed for pt, insurance [employer] will be started tomorrow.   Expected Discharge Plan: Skilled Nursing Facility Barriers to Discharge: Continued Medical Work up, SNF Pending bed offer  Expected Discharge Plan and Services In-house Referral: Clinical Social Work   Post Acute Care Choice: Skilled Nursing Facility Living arrangements for the past 2 months: Single Family Home                                       Social Determinants of Health (SDOH) Interventions SDOH Screenings   Food Insecurity: No Food Insecurity (04/27/2024)  Housing: Low Risk  (04/27/2024)  Transportation Needs: No Transportation Needs (04/27/2024)  Utilities: Not At Risk (04/27/2024)  Alcohol  Screen: Low Risk  (03/28/2024)  Depression (PHQ2-9): Low Risk  (04/01/2024)  Financial Resource Strain: Low Risk  (03/28/2024)  Physical Activity: Unknown (03/28/2024)  Social Connections: Moderately Integrated (04/27/2024)  Stress: Stress Concern Present (03/28/2024)  Tobacco Use: Low Risk  (04/28/2024)    Readmission Risk Interventions     No data to display

## 2024-05-03 NOTE — Plan of Care (Signed)

## 2024-05-04 DIAGNOSIS — Z86718 Personal history of other venous thrombosis and embolism: Secondary | ICD-10-CM | POA: Diagnosis not present

## 2024-05-04 DIAGNOSIS — I10 Essential (primary) hypertension: Secondary | ICD-10-CM | POA: Diagnosis not present

## 2024-05-04 DIAGNOSIS — S72002A Fracture of unspecified part of neck of left femur, initial encounter for closed fracture: Secondary | ICD-10-CM | POA: Diagnosis not present

## 2024-05-04 DIAGNOSIS — F32A Depression, unspecified: Secondary | ICD-10-CM | POA: Diagnosis not present

## 2024-05-04 DIAGNOSIS — F419 Anxiety disorder, unspecified: Secondary | ICD-10-CM | POA: Diagnosis not present

## 2024-05-04 MED ORDER — PRAMIPEXOLE DIHYDROCHLORIDE 0.125 MG PO TABS: 0.1250 mg | ORAL_TABLET | Freq: Every day | ORAL | 0 refills | Status: DC

## 2024-05-04 MED ORDER — DOCUSATE SODIUM 100 MG PO CAPS: 100.0000 mg | ORAL_CAPSULE | Freq: Two times a day (BID) | ORAL | Status: DC

## 2024-05-04 MED ORDER — VITAMIN D 25 MCG (1000 UNIT) PO TABS
5000.0000 [IU] | ORAL_TABLET | Freq: Every day | ORAL | Status: DC
  Administered 2024-05-04: 5000 [IU] via ORAL
  Filled 2024-05-04: qty 5

## 2024-05-04 MED ORDER — POLYETHYLENE GLYCOL 3350 17 G PO PACK: 17.0000 g | PACK | Freq: Every day | ORAL | Status: DC

## 2024-05-04 NOTE — Progress Notes (Signed)
 Report given to [facility] staff, all questions and concerns were fully answered.

## 2024-05-04 NOTE — Progress Notes (Signed)
 Physical Therapy Treatment Patient Details Name: Monica Curry MRN: [id number] DOB: 08-27-1931 Today's Date: 05/04/2024   History of Present Illness Patient is a 89 year old with mechanical fall resulting in left hip fracture and left distal radius fracture. S/p ORIF of L hip and non-operative management of LUE. PMH: Anemia, c-diff, CKD, HTN, osteoporosis, PE, TIA.    PT Comments  Pt received in the recliner and agreeable to session. Session focused on gait training with L platform RW. Pt reports no increased pain in LUE with use of platform RW and demonstrates improved stability requiring up to CGA for safety. Pt continues to be limited by L hip pain and impaired WB tolerance, however is able to increase gait distance this session. Pt continues to benefit from PT services to progress toward functional mobility goals.     If plan is discharge home, recommend the following: Two people to help with walking and/or transfers;Two people to help with bathing/dressing/bathroom;Assist for transportation;Help with stairs or ramp for entrance;Assistance with cooking/housework   Can travel by private vehicle     No  Equipment Recommendations  Wheelchair (measurements PT)    Recommendations for Other Services       Precautions / Restrictions Precautions Precautions: Fall Recall of Precautions/Restrictions: Intact Required Braces or Orthoses: Splint/Cast Splint/Cast: LUE short arm splint Restrictions LUE Weight Bearing Per Provider Order: Weight bear through elbow only LLE Weight Bearing Per Provider Order: Weight bearing as tolerated Other Position/Activity Restrictions: Per secure chat with Monica Curry on 5/23 pt can elbow WB if tolerated, however if too painful needs to be full NWB.     Mobility  Bed Mobility               General bed mobility comments: Pt in recliner at beginning and end of session    Transfers Overall transfer level: Needs assistance Equipment used: Left  platform walker Transfers: Sit to/from Stand Sit to Stand: Min assist           General transfer comment: Min A for power up from recliner x2 with cues for hand placement    Ambulation/Gait Ambulation/Gait assistance: Contact guard assist Gait Distance (Feet): 20 Feet (+20) Assistive device: Left platform walker Gait Pattern/deviations: Step-to pattern, Antalgic, Decreased stance time - left, Decreased step length - left, Decreased weight shift to left       General Gait Details: improved stability with platform RW. Limited WB tolerance on LLE due to pain and 1 seated rest break due to fatigue   Stairs             Wheelchair Mobility     Tilt Bed    Modified Rankin (Stroke Patients Only)       Balance Overall balance assessment: Needs assistance Sitting-balance support: Feet supported Sitting balance-Leahy Scale: Fair Sitting balance - Comments: sitting in recliner   Standing balance support: Bilateral upper extremity supported, Reliant on assistive device for balance, During functional activity Standing balance-Leahy Scale: Poor Standing balance comment: with platform RW                            Communication Communication Communication: No apparent difficulties  Cognition Arousal: Alert Behavior During Therapy: WFL for tasks assessed/performed   PT - Cognitive impairments: No apparent impairments                         Following commands: Impaired, Intact  Cueing Cueing Techniques: Verbal cues  Exercises      General Comments        Pertinent Vitals/Pain Pain Assessment Pain Assessment: Faces Faces Pain Scale: Hurts little more Pain Location: L hip with mobility Pain Descriptors / Indicators: Aching, Discomfort, Guarding, Grimacing Pain Intervention(s): Limited activity within patient's tolerance, Monitored during session, Repositioned     PT Goals (current goals can now be found in the care plan section)  Acute Rehab PT Goals Patient Stated Goal: to get better PT Goal Formulation: With patient Time For Goal Achievement: 05/13/24 Progress towards PT goals: Progressing toward goals    Frequency    Min 2X/week       AM-PAC PT "6 Clicks" Mobility   Outcome Measure  Help needed turning from your back to your side while in a flat bed without using bedrails?: A Little Help needed moving from lying on your back to sitting on the side of a flat bed without using bedrails?: A Lot Help needed moving to and from a bed to a chair (including a wheelchair)?: A Little Help needed standing up from a chair using your arms (e.g., wheelchair or bedside chair)?: A Little Help needed to walk in hospital room?: A Little Help needed climbing 3-5 steps with a railing? : Total 6 Click Score: 15    End of Session Equipment Utilized During Treatment: Gait belt Activity Tolerance: Patient tolerated treatment well Patient left: in chair;with call bell/phone within reach Nurse Communication: Mobility status PT Visit Diagnosis: Difficulty in walking, not elsewhere classified (R26.2);Other abnormalities of gait and mobility (R26.89)     Time: 0936-1000 PT Time Calculation (min) (ACUTE ONLY): 24 min  Charges:    $Gait Training: 23-37 mins PT General Charges $$ ACUTE PT VISIT: 1 Visit                     Monica Curry, PTA Acute Rehabilitation Services Secure Chat Preferred  [phone]    Monica Curry 05/04/2024, 10:40 AM

## 2024-05-04 NOTE — Progress Notes (Signed)
 Discharge summary (AVS)/necessary documents provided to PTAR, Pt d/c to [facility] as ordered, Pt remains alert/oriented in no apparent distress, NO complaints.

## 2024-05-04 NOTE — Discharge Summary (Signed)
 [facility]  Physician Discharge Summary   Patient ID: Monica Curry MRN: [id number] DOB/AGE: Oct 04, 1931 89 y.o.  Admit date: 04/27/2024 Discharge date:   05/04/2024   PCP: Christel Cousins, MD  DISCHARGE DIAGNOSES:    Hip fracture [facility])   Fracture, Col[place], left, closed   RECOMMENDATIONS FOR OUTPATIENT FOLLOW UP: Patient needs to follow-up with Dr. Curtiss Dowdy for hip surgery Patient needs to follow-up with Dr. Marce Sensing for her wrist fracture Please check CBC and basic metabolic panel in 3 to 4 days   Home Health: Going to SNF Equipment/Devices: None  CODE STATUS: Full code  DISCHARGE CONDITION: fair  Diet recommendation: Regular as tolerated  INITIAL HISTORY: 89 y.o. 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  Patient had a mechanical fall resulting in left hip fracture and left distal radius fracture.  She was hospitalized for further management.     Consultants: Orthopedics.  Hand surgeon   Procedures: ORIF left hip  HOSPITAL COURSE:   Left hip fracture Underwent surgery on 5/21.  Pain is reasonably well-controlled.  Seen by physical therapy.  Skilled nursing facility is recommended.     Left distal radius fracture Patient underwent closed reduction of the left wrist in the emergency department with improved length and alignment of the distal radius.  She was seen by hand surgery.  No plans for surgical intervention at this time.  Outpatient follow-up.  Pain control. Does have bruising around the forearm area at the elbow but she is able to move her fingers.  Able to move her elbow without problems.  No significant pain.   Acute kidney injury Baseline creatinine seems to be around 1.2. BUN and creatinine noted to be higher few days ago.  Creatinine peaked at 1.68.  Was gently hydrated.  Creatinine is back to baseline.   Leukocytosis Most likely reactive.  She  was afebrile.  WBC has improved.  UA was unremarkable.     Essential hypertension Her pain and restless legs probably contributing to elevated blood pressure readings overnight.  Patient does not want to try new medications at this time. Continue propranolol  for now.  Due to her advanced age we will not strive for aggressive control to avoid hypotension.   Acute urinary retention Required Foley catheter for about 48 hours.  Removed on 5/23.  Has been able to void on her own.     Essential tremors Propranolol .   Restless leg Seems to be tolerating low-dose Mirapex.  Symptoms have improved.Aaron Aas   History of pulmonary embolism She has had more than 1 episode of same.  Is on lifelong anticoagulation.  Apixaban  was held for surgery.  Has been resumed.   Last PE at least in our system was in 2022.   Thyroid  Nodule Patient is aware. Has had previous work up for same. Outpatient management.   Patient is stable for discharge to SNF.    PERTINENT LABS:  The results of significant diagnostics from this hospitalization (including imaging, microbiology, ancillary and laboratory) are listed below for reference.    Microbiology: Recent Results (from the past 240 hours)  Surgical pcr screen     Status: None   Collection Time: 04/27/24  8:41 PM   Specimen: Nasal Mucosa; Nasal Swab  Result Value Ref Range Status   MRSA, PCR NEGATIVE NEGATIVE Final   Staphylococcus aureus NEGATIVE NEGATIVE Final    Comment: (NOTE) The Xpert SA Assay (FDA approved for NASAL specimens  in patients 57 years of age and older), is one component of a comprehensive surveillance program. It is not intended to diagnose infection nor to guide or monitor treatment. Performed at [facility], [address], [place], [place] [address]   MRSA Next Gen by PCR, Nasal     Status: None   Collection Time: 04/28/24 10:42 AM   Specimen: Nasal Mucosa; Nasal Swab  Result Value Ref Range Status   MRSA by PCR Next Gen NOT  DETECTED NOT DETECTED Final    Comment: (NOTE) The GeneXpert MRSA Assay (FDA approved for NASAL specimens only), is one component of a comprehensive MRSA colonization surveillance program. It is not intended to diagnose MRSA infection nor to guide or monitor treatment for MRSA infections. Test performance is not FDA approved in patients less than 65 years old. Performed at [facility], [address] [place], [place] [address]      Labs:   Basic Metabolic Panel: Recent Labs  Lab 04/27/24 1724 04/29/24 0423 04/30/24 0737 05/01/24 0424 05/02/24 0537  NA 136 132* 135 136 137  K 4.7 4.6 4.4 5.0 4.4  CL 103 98 102 106 102  CO2 26 22 24  21* 29  GLUCOSE 117* 228* 133* 105* 99  BUN 24* 30* 28* 31* 24*  CREATININE 1.28* 1.68* 1.35* 1.20* 1.22*  CALCIUM  9.3 8.4* 8.4* 8.2* 8.5*    CBC: Recent Labs  Lab 04/27/24 1709 04/27/24 1724 04/29/24 0423 04/30/24 0737 05/01/24 0424  WBC  --  8.8 11.0* 17.5* 12.0*  NEUTROABS  --  5.4  --   --   --   HGB 17.0* 15.2* 13.5 13.2 12.2  HCT 50.0* 46.9* 40.5 40.3 37.5  MCV  --  87.0 86.9 85.9 86.4  PLT  --  503* 363 412* 416*     CBG: Recent Labs  Lab 05/03/24 0328  GLUCAP 127*     IMAGING STUDIES DG HIP PORT UNILAT W OR W/O PELVIS 1V LEFT Result Date: 04/28/2024 CLINICAL DATA:  Status post proximal femoral nailing. EXAM: DG HIP (WITH OR WITHOUT PELVIS) 1V PORT LEFT COMPARISON:  Left femoral radiographs 04/27/2024 FINDINGS: Interval proximal left femoral cephalomedullary nail fixation of the previously seen intertrochanteric fracture. Improved alignment. There is again proximal displacement of the lesser trochanter. Expected postoperative lateral hip and thigh subcutaneous air. No perihardware lucency is seen to indicate hardware failure or loosening. Moderate superomedial right femoroacetabular joint space narrowing. Mild bilateral sacroiliac subchondral sclerosis. Mild pubic symphysis joint space narrowing. IMPRESSION:  Interval proximal left femoral cephalomedullary nail fixation of the previously seen intertrochanteric fracture. Improved alignment. Electronically Signed   By: Bertina Broccoli M.D.   On: 04/28/2024 20:00   DG FEMUR MIN 2 VIEWS LEFT Result Date: 04/28/2024 CLINICAL DATA:  Left femur ORIF.  Intraoperative fluoroscopy. EXAM: LEFT FEMUR 2 VIEWS COMPARISON:  Left femur radiographs 04/27/2024 FINDINGS: Images were performed intraoperatively without the presence of a radiologist. The patient is undergoing cephalomedullary nail fixation of the previously seen left proximal femoral intertrochanteric fracture. No hardware complication is seen. Total fluoroscopy images: 6 Total fluoroscopy time: 58 seconds Total dose: Radiation Exposure Index (as provided by the fluoroscopic device): 7.1 mGy air Kerma Please see intraoperative findings for further detail. IMPRESSION: Intraoperative fluoroscopy for left femur ORIF. Electronically Signed   By: Bertina Broccoli M.D.   On: 04/28/2024 19:59   DG C-Arm 1-60 Min-No Report Result Date: 04/28/2024 Fluoroscopy was utilized by the requesting physician.  No radiographic interpretation.  DG Wrist Complete Left Result Date: 04/27/2024 CLINICAL DATA:  Postreduction EXAM: LEFT WRIST - COMPLETE 3+ VIEW COMPARISON:  Earlier today FINDINGS: In cast views of the left wrist demonstrate distal left radial and ulnar fractures. Continued mild posterior displacement of the distal fracture fragments, but alignment is improved since prior study. IMPRESSION: Improved alignment with continued mild posterior displacement. Electronically Signed   By: Janeece Mechanic M.D.   On: 04/27/2024 20:16   DG Elbow Complete Left Result Date: 04/27/2024 CLINICAL DATA:  Fall, landed on left side EXAM: LEFT ELBOW - COMPLETE 3+ VIEW COMPARISON:  None Available. FINDINGS: No acute bony abnormality. Specifically, no fracture, subluxation, or dislocation. No joint effusion. IMPRESSION: No acute bony abnormality.  Electronically Signed   By: Janeece Mechanic M.D.   On: 04/27/2024 20:15   DG FEMUR MIN 2 VIEWS LEFT Result Date: 04/27/2024 CLINICAL DATA:  Left femur fracture EXAM: LEFT FEMUR 2 VIEWS COMPARISON:  None Available. FINDINGS: There is a left femoral intertrochanteric fracture. Varus angulation. No subluxation or dislocation. No additional femoral fracture. No joint effusion in the left knee. IMPRESSION: Angulated left femoral intertrochanteric fracture. Electronically Signed   By: Janeece Mechanic M.D.   On: 04/27/2024 20:14   CT Head Wo Contrast Result Date: 04/27/2024 CLINICAL DATA:  Head trauma EXAM: CT HEAD WITHOUT CONTRAST CT CERVICAL SPINE WITHOUT CONTRAST TECHNIQUE: Multidetector CT imaging of the head and cervical spine was performed following the standard protocol without intravenous contrast. Multiplanar CT image reconstructions of the cervical spine were also generated. RADIATION DOSE REDUCTION: This exam was performed according to the departmental dose-optimization program which includes automated exposure control, adjustment of the mA and/or kV according to patient size and/or use of iterative reconstruction technique. COMPARISON:  CT head 07/07/2021. FINDINGS: CT HEAD FINDINGS Brain: No acute intracranial hemorrhage. No CT evidence of acute infarct. Nonspecific hypoattenuation in the periventricular and subcortical white matter favored to reflect chronic microvascular ischemic changes. Mild parenchymal volume loss. No edema, mass effect, or midline shift. The basilar cisterns are patent. Multiple dural calcifications again noted. Ventricles: The ventricles are normal. Vascular: Atherosclerotic calcifications of the carotid siphons. No hyperdense vessel. Skull: No acute or aggressive finding. Degenerative changes of the mandibular condyles, greater on the right. Orbits: Orbits are symmetric. Sinuses: Mild mucosal thickening in the ethmoid sinuses. Other: Mastoid air cells are clear. CT CERVICAL SPINE  FINDINGS Alignment: Normal. Skull base and vertebrae: No acute fracture. No primary bone lesion or focal pathologic process. Soft tissues and spinal canal: No prevertebral fluid or swelling. No visible canal hematoma. Prominence of the right thyroid  lobe with multiple nodules noted. Disc levels: Intervertebral disc space narrowing most pronounced posteriorly at C3-4 with additional mild disc space narrowing at C5-6 and C6-7. Small disc bulges at multiple levels. No high-grade osseous spinal canal stenosis. Facet arthrosis and uncovertebral hypertrophy throughout the cervical spine with foraminal narrowing most pronounced on the right at C4-5 and bilaterally at C5-6. Upper chest: Septal thickening in the lung apices which could reflect mild pulmonary edema. Other: None. IMPRESSION: No CT evidence of acute intracranial abnormality. No acute fracture or traumatic malalignment of the cervical spine. Degenerative changes of the cervical spine as above. Prominence of the right thyroid  lobe with heterogeneous appearance and multiple nodules. Consider correlation with nonemergent thyroid  ultrasound. Septal thickening in the lung apices concerning for pulmonary edema. Electronically Signed   By: Denny Flack M.D.   On: 04/27/2024 18:40   CT Cervical Spine Wo Contrast Result Date: 04/27/2024 CLINICAL DATA:  Head trauma EXAM: CT HEAD WITHOUT CONTRAST CT CERVICAL SPINE WITHOUT CONTRAST TECHNIQUE: Multidetector CT imaging of the head and cervical spine was performed following the standard protocol without intravenous contrast. Multiplanar CT image reconstructions of the cervical spine were also generated. RADIATION DOSE REDUCTION: This exam was performed according to the departmental dose-optimization program which includes automated exposure control, adjustment of the mA and/or kV according to patient size and/or use of iterative reconstruction technique. COMPARISON:  CT head 07/07/2021. FINDINGS: CT HEAD FINDINGS Brain: No  acute intracranial hemorrhage. No CT evidence of acute infarct. Nonspecific hypoattenuation in the periventricular and subcortical white matter favored to reflect chronic microvascular ischemic changes. Mild parenchymal volume loss. No edema, mass effect, or midline shift. The basilar cisterns are patent. Multiple dural calcifications again noted. Ventricles: The ventricles are normal. Vascular: Atherosclerotic calcifications of the carotid siphons. No hyperdense vessel. Skull: No acute or aggressive finding. Degenerative changes of the mandibular condyles, greater on the right. Orbits: Orbits are symmetric. Sinuses: Mild mucosal thickening in the ethmoid sinuses. Other: Mastoid air cells are clear. CT CERVICAL SPINE FINDINGS Alignment: Normal. Skull base and vertebrae: No acute fracture. No primary bone lesion or focal pathologic process. Soft tissues and spinal canal: No prevertebral fluid or swelling. No visible canal hematoma. Prominence of the right thyroid  lobe with multiple nodules noted. Disc levels: Intervertebral disc space narrowing most pronounced posteriorly at C3-4 with additional mild disc space narrowing at C5-6 and C6-7. Small disc bulges at multiple levels. No high-grade osseous spinal canal stenosis. Facet arthrosis and uncovertebral hypertrophy throughout the cervical spine with foraminal narrowing most pronounced on the right at C4-5 and bilaterally at C5-6. Upper chest: Septal thickening in the lung apices which could reflect mild pulmonary edema. Other: None. IMPRESSION: No CT evidence of acute intracranial abnormality. No acute fracture or traumatic malalignment of the cervical spine. Degenerative changes of the cervical spine as above. Prominence of the right thyroid  lobe with heterogeneous appearance and multiple nodules. Consider correlation with nonemergent thyroid  ultrasound. Septal thickening in the lung apices concerning for pulmonary edema. Electronically Signed   By: Denny Flack  M.D.   On: 04/27/2024 18:40   DG Hip Unilat W or Wo Pelvis 2-3 Views Left Result Date: 04/27/2024 CLINICAL DATA:  Left hip pain after fall today. EXAM: DG HIP (WITH OR WITHOUT PELVIS) 2-3V LEFT COMPARISON:  None Available. FINDINGS: Moderately displaced proximal left femoral neck fracture is noted. Right hip is unremarkable. IMPRESSION: Moderately displaced proximal left femoral neck fracture. Electronically Signed   By: Rosalene Colon M.D.   On: 04/27/2024 18:06   DG Wrist Complete Left Result Date: 04/27/2024 CLINICAL DATA:  Left wrist pain after fall. EXAM: LEFT WRIST - COMPLETE 3+ VIEW COMPARISON:  None Available. FINDINGS: Moderately displaced distal left radial fracture is noted with posterior displacement of distal fragments. Minimally displaced distal left ulnar fracture is noted. Moderate degenerative changes seen involving first carpometacarpal joint. IMPRESSION: Displaced distal left radial and ulnar fractures as noted above. Electronically Signed   By: Rosalene Colon M.D.   On: 04/27/2024 18:05   DG Chest Port 1 View Result Date: 04/27/2024 CLINICAL DATA:  Fall. EXAM: PORTABLE CHEST 1 VIEW COMPARISON:  June 14, 2021. FINDINGS: Mild cardiomegaly with mild central pulmonary vascular congestion is noted. No consolidative process is noted. The visualized skeletal structures are unremarkable. IMPRESSION: Mild cardiomegaly with mild central pulmonary vascular congestion. Electronically Signed   By: Rosalene Colon M.D.   On: 04/27/2024 18:02  DISCHARGE EXAMINATION: See progress note from earlier today  DISPOSITION: SNF  Discharge Instructions     Call MD for:  difficulty breathing, headache or visual disturbances   Complete by: As directed    Call MD for:  extreme fatigue   Complete by: As directed    Call MD for:  persistant dizziness or light-headedness   Complete by: As directed    Call MD for:  persistant nausea and vomiting   Complete by: As directed    Call MD for:  severe  uncontrolled pain   Complete by: As directed    Call MD for:  temperature >100.4   Complete by: As directed    Diet - low sodium heart healthy   Complete by: As directed    Discharge instructions   Complete by: As directed    Please review instructions on the discharge summary.  You were cared for by a hospitalist during your hospital stay. If you have any questions about your discharge medications or the care you received while you were in the hospital after you are discharged, you can call the unit and asked to speak with the hospitalist on call if the hospitalist that took care of you is not available. Once you are discharged, your primary care physician will handle any further medical issues. Please note that NO REFILLS for any discharge medications will be authorized once you are discharged, as it is imperative that you return to your primary care physician (or establish a relationship with a primary care physician if you do not have one) for your aftercare needs so that they can reassess your need for medications and monitor your lab values. If you do not have a primary care physician, you can call [phone] for a physician referral.   Increase activity slowly   Complete by: As directed          Allergies as of 05/04/2024       Reactions   Codeine Hives   Other Other (See Comments)   Reaction: Fever (intolerance)   Cortisone    Causes jitteriness.   Dexamethasone     anxiety   Hctz [hydrochlorothiazide ] Other (See Comments)   Low sodium   Meloxicam  Nausea Only   Gastritis and nausea   Mysoline [primidone]    Pneumovax 23 [pneumococcal Vac Polyvalent]    Red, raised area.  Knot at site of injection   Prednisone     Anxiety   Topiramate     Sleepiness, numbness feet tingling in arm.   Toradol  [ketorolac  Tromethamine ] Swelling   Localized redness at injection site.     Ace Inhibitors Cough        Medication List     TAKE these medications    acetaminophen  500 MG  tablet Commonly known as: TYLENOL  Take 500 mg by mouth every 6 (six) hours as needed for moderate pain.   apixaban  5 MG Tabs tablet Commonly known as: Eliquis  Take 1 tablet (5 mg total) by mouth 2 (two) times daily.   atorvastatin  10 MG tablet Commonly known as: LIPITOR TAKE 1 TABLET BY MOUTH DAILY FOR CHOLESTEROL   CALCIUM  PLUS VITAMIN D  PO Take 1 tablet by mouth daily. 1 chewable daily   Cyanocobalamin  500 MCG Chew Chew 1 tablet by mouth daily.   docusate sodium 100 MG capsule Commonly known as: COLACE Take 1 capsule (100 mg total) by mouth 2 (two) times daily.   escitalopram  5 MG tablet Commonly known as: LEXAPRO  Take 1 tablet (5 mg total) by  mouth daily.   hydrOXYzine  25 MG tablet Commonly known as: ATARAX  Take 1 tablet (25 mg total) by mouth daily.   Magnesium  250 MG Tabs Take 250 mg by mouth 2 (two) times a day.   methocarbamol 500 MG tablet Commonly known as: ROBAXIN Take 1 tablet (500 mg total) by mouth every 8 (eight) hours as needed for muscle spasms.   oxyCODONE 5 MG immediate release tablet Commonly known as: Oxy IR/ROXICODONE Take 0.5-1 tablets (2.5-5 mg total) by mouth every 4 (four) hours as needed for moderate pain (pain score 4-6) or severe pain (pain score 7-10) (2.5 mg pain score 4-6, 5 mg pain score 7-10).   polyethylene glycol 17 g packet Commonly known as: MIRALAX / GLYCOLAX Take 17 g by mouth daily.   pramipexole 0.125 MG tablet Commonly known as: MIRAPEX Take 1 tablet (0.125 mg total) by mouth at bedtime.   propranolol  40 MG tablet Commonly known as: INDERAL  Take 1 tablet (40 mg total) by mouth 4 (four) times daily. TAKE 1 TABLET BY MOUTH FOUR TIMES DAILY AT BEDTIME WITH MEALS FOR BLOOD PRESSURE OR TREMORS   VITAMIN D  PO Take 5,000 Units by mouth daily. 2 tablets daily          Follow-up Information     Christel Cousins, MD Follow up.   Specialty: Family Medicine Contact information: [address] [place]  [address] [phone]         Laneta Pintos, MD Follow up in 1 week(s).   Specialty: Orthopedic Surgery Why: post hospitalization follow up Contact information: [address] [place] [address] [phone]         Merrill Abide, MD. Schedule an appointment as soon as possible for a visit in 1 week(s).   Specialty: Orthopedic Surgery Why: post hospitalization follow up for left wrsit fracture Contact information: [address] [place]  [place] [address] [phone]                 TOTAL DISCHARGE TIME: 35 minutes  Danessa Mensch Lyndon Santiago  [facility] Pager on [email or web address]  05/04/2024, 11:16 AM

## 2024-05-04 NOTE — Plan of Care (Signed)
  Problem: Education: Goal: Knowledge of General Education information will improve Description: Including pain rating scale, medication(s)/side effects and non-pharmacologic comfort measures Outcome: Adequate for Discharge   Problem: Clinical Measurements: Goal: Ability to maintain clinical measurements within normal limits will improve Outcome: Adequate for Discharge   Problem: Activity: Goal: Risk for activity intolerance will decrease Outcome: Adequate for Discharge   Problem: Nutrition: Goal: Adequate nutrition will be maintained Outcome: Adequate for Discharge   Problem: Elimination: Goal: Will not experience complications related to bowel motility Outcome: Adequate for Discharge   Problem: Pain Managment: Goal: General experience of comfort will improve and/or be controlled Outcome: Adequate for Discharge   Problem: Safety: Goal: Ability to remain free from injury will improve Outcome: Adequate for Discharge

## 2024-05-04 NOTE — TOC Transition Note (Signed)
 Transition of Care [facility]) - Discharge Note   Patient Details  Name: Monica Curry MRN: [id number] Date of Birth: July 08, 1931  Transition of Care [facility]) CM/SW Contact:  Elspeth Hals, LCSW Phone Number: 05/04/2024, 12:49 PM   Clinical Narrative:  Pt discharging to [facility], room 607p.  RN call report to [phone].  PTAR called [phone].     Final next level of care: Skilled Nursing Facility Barriers to Discharge: Barriers Resolved   Patient Goals and CMS Choice Patient states their goals for this hospitalization and ongoing recovery are:: live independent CMS Medicare.gov Compare Post Acute Care list provided to:: Patient Represenative (must comment) (daughter [name]) Choice offered to / list presented to : Patient, Adult Children      Discharge Placement              Patient chooses bed at: [facility] Patient to be transferred to facility by: ptar Name of family member notified: daughter [name] Patient and family notified of of transfer: 05/04/24  Discharge Plan and Services Additional resources added to the After Visit Summary for   In-house Referral: Clinical Social Work   Post Acute Care Choice: Skilled Nursing Facility                               Social Drivers of Health (SDOH) Interventions SDOH Screenings   Food Insecurity: No Food Insecurity (04/27/2024)  Housing: Low Risk  (04/27/2024)  Transportation Needs: No Transportation Needs (04/27/2024)  Utilities: Not At Risk (04/27/2024)  Alcohol  Screen: Low Risk  (03/28/2024)  Depression (PHQ2-9): Low Risk  (04/01/2024)  Financial Resource Strain: Low Risk  (03/28/2024)  Physical Activity: Unknown (03/28/2024)  Social Connections: Moderately Integrated (04/27/2024)  Stress: Stress Concern Present (03/28/2024)  Tobacco Use: Low Risk  (04/28/2024)     Readmission Risk Interventions     No data to display

## 2024-05-04 NOTE — TOC Progression Note (Addendum)
 Transition of Care [facility]) - Progression Note    Patient Details  Name: Monica Curry MRN: [id number] Date of Birth: 1930/12/22  Transition of Care [facility]) CM/SW Contact  Elspeth Hals, LCSW Phone Number: 05/04/2024, 11:06 AM  Clinical Narrative:   New PT note needed for insurance auth.  PT aware.  PASSR remains pending  1100: SNF auth request submitted and approved: [id number], 3 days: 5/27-5/29.  PASSR approved: [id number] E  CSW received confirmation from [place] that [facility] can receive today. MD informed.     Expected Discharge Plan: Skilled Nursing Facility Barriers to Discharge: Continued Medical Work up, SNF Pending bed offer  Expected Discharge Plan and Services In-house Referral: Clinical Social Work   Post Acute Care Choice: Skilled Nursing Facility Living arrangements for the past 2 months: Single Family Home                                       Social Determinants of Health (SDOH) Interventions SDOH Screenings   Food Insecurity: No Food Insecurity (04/27/2024)  Housing: Low Risk  (04/27/2024)  Transportation Needs: No Transportation Needs (04/27/2024)  Utilities: Not At Risk (04/27/2024)  Alcohol  Screen: Low Risk  (03/28/2024)  Depression (PHQ2-9): Low Risk  (04/01/2024)  Financial Resource Strain: Low Risk  (03/28/2024)  Physical Activity: Unknown (03/28/2024)  Social Connections: Moderately Integrated (04/27/2024)  Stress: Stress Concern Present (03/28/2024)  Tobacco Use: Low Risk  (04/28/2024)    Readmission Risk Interventions     No data to display

## 2024-05-04 NOTE — Progress Notes (Signed)
 [facility] PROGRESS NOTE   Monica Curry QVZ:[id number] DOB: December 20, 1930 DOA: 04/27/2024  PCP: Christel Cousins, MD  Brief History: 89 y.o. 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  Patient had a mechanical fall resulting in left hip fracture and left distal radius fracture.  She was hospitalized for further management.    Consultants: Orthopedics.  Hand surgeon  Procedures: ORIF left hip    Subjective/Interval History: Patient feels well.  Slept well overnight.  Restless legs have significantly improved with Mirapex .  No new complaints offered.    Assessment/Plan:  Left hip fracture Underwent surgery on 5/21.  Pain is reasonably well-controlled.  Seen by physical therapy.  Skilled nursing facility is recommended.  TOC is following.    Left distal radius fracture Patient underwent closed reduction of the left wrist in the emergency department with improved length and alignment of the distal radius.  She was seen by hand surgery.  No plans for surgical intervention at this time.  Outpatient follow-up.  Pain control. Does have bruising around the forearm area at the elbow but she is able to move her fingers.  Able to move her elbow without problems.  No significant pain.  Acute kidney injury Baseline creatinine seems to be around 1.2. BUN and creatinine noted to be higher few days ago.  Creatinine peaked at 1.68.  Was gently hydrated.  Creatinine is back to baseline.  Leukocytosis Most likely reactive.  She was afebrile.  WBC has improved.  UA was unremarkable.    Essential hypertension Her pain and restless legs probably contributing to elevated blood pressure readings overnight.  Patient does not want to try new medications at this time. Continue propranolol  for now.  Due to her advanced age we will not strive for aggressive control to avoid hypotension.  Acute urinary  retention Required Foley catheter for about 48 hours.  Removed on 5/23.  Has been able to void on her own.    Essential tremors Propranolol .  Restless leg Seems to be tolerating low-dose Mirapex .  Symptoms have improved.Aaron Aas  History of pulmonary embolism She has had more than 1 episode of same.  Is on lifelong anticoagulation.  Apixaban  was held for surgery.  Has been resumed.   Last PE at least in our system was in 2022.  Thyroid  Nodule Patient is aware. Has had previous work up for same. Outpatient management.  DVT Prophylaxis: On apixaban  Code Status: Full code Family Communication: Discussed with patient Disposition Plan: Remains medically stable for SNF.    Medications: Scheduled:  acetaminophen   650 mg Oral Q6H   apixaban   5 mg Oral BID   atorvastatin   10 mg Oral Daily   cholecalciferol   5,000 Units Oral Daily   docusate sodium   100 mg Oral BID   escitalopram   5 mg Oral QHS   hydrOXYzine   25 mg Oral Daily   magnesium  oxide  400 mg Oral BID   polyethylene glycol  17 g Oral Daily   pramipexole   0.125 mg Oral QHS   propranolol   40 mg Oral QID   Continuous:   PRN:hydrALAZINE , HYDROmorphone  (DILAUDID ) injection, methocarbamol , metoCLOPramide  **OR** metoCLOPramide  (REGLAN ) injection, ondansetron  (ZOFRAN ) IV, oxyCODONE   Antibiotics: Anti-infectives (From admission, onward)    Start     Dose/Rate Route Frequency Ordered Stop   04/29/24 0600  ceFAZolin  (ANCEF ) IVPB 2g/100 mL premix        2 g 200 mL/hr over 30 Minutes  Intravenous On call to O.R. 04/28/24 1620 04/28/24 1705   04/29/24 0000  ceFAZolin (ANCEF) IVPB 2g/100 mL premix        2 g 200 mL/hr over 30 Minutes Intravenous Every 8 hours 04/28/24 1905 04/29/24 1639   04/28/24 2200  ceFAZolin (ANCEF) IVPB 2g/100 mL premix  Status:  Discontinued        2 g 200 mL/hr over 30 Minutes Intravenous Every 8 hours 04/28/24 1845 04/28/24 1905       Objective:  Vital Signs  Vitals:   05/03/24 1952 05/04/24 0347  05/04/24 0414 05/04/24 0753  BP: (!) 129/57 (!) 182/67 (!) 143/68 (!) 196/77  Pulse: 77 66  79  Resp: 15 16  16   Temp: 98.3 F (36.8 C) 97.6 F (36.4 C)  98.2 F (36.8 C)  TempSrc: Oral     SpO2: 96% 96%  98%  Weight:      Height:        Intake/Output Summary (Last 24 hours) at 05/04/2024 1044 Last data filed at 05/04/2024 0851 Gross per 24 hour  Intake 480 ml  Output --  Net 480 ml   Filed Weights   04/27/24 1702 04/28/24 1450  Weight: 63.5 kg 62.6 kg    General appearance: Awake alert.  In no distress Resp: Clear to auscultation bilaterally.  Normal effort Cardio: S1-S2 is normal regular.  No S3-S4.  No rubs murmurs or bruit GI: Abdomen is soft.  Nontender nondistended.  Bowel sounds are present normal.  No masses organomegaly    Lab Results:  Data Reviewed: I have personally reviewed following labs and reports of the imaging studies  CBC: Recent Labs  Lab 04/27/24 1709 04/27/24 1724 04/29/24 0423 04/30/24 0737 05/01/24 0424  WBC  --  8.8 11.0* 17.5* 12.0*  NEUTROABS  --  5.4  --   --   --   HGB 17.0* 15.2* 13.5 13.2 12.2  HCT 50.0* 46.9* 40.5 40.3 37.5  MCV  --  87.0 86.9 85.9 86.4  PLT  --  503* 363 412* 416*    Basic Metabolic Panel: Recent Labs  Lab 04/27/24 1724 04/29/24 0423 04/30/24 0737 05/01/24 0424 05/02/24 0537  NA 136 132* 135 136 137  K 4.7 4.6 4.4 5.0 4.4  CL 103 98 102 106 102  CO2 26 22 24  21* 29  GLUCOSE 117* 228* 133* 105* 99  BUN 24* 30* 28* 31* 24*  CREATININE 1.28* 1.68* 1.35* 1.20* 1.22*  CALCIUM  9.3 8.4* 8.4* 8.2* 8.5*    GFR: Estimated Creatinine Clearance: 25.9 mL/min (A) (by C-G formula based on SCr of 1.22 mg/dL (H)).  Coagulation Profile: Recent Labs  Lab 04/27/24 1724  INR 1.6*     Recent Results (from the past 240 hours)  Surgical pcr screen     Status: None   Collection Time: 04/27/24  8:41 PM   Specimen: Nasal Mucosa; Nasal Swab  Result Value Ref Range Status   MRSA, PCR NEGATIVE NEGATIVE Final    Staphylococcus aureus NEGATIVE NEGATIVE Final    Comment: (NOTE) The Xpert SA Assay (FDA approved for NASAL specimens in patients 43 years of age and older), is one component of a comprehensive surveillance program. It is not intended to diagnose infection nor to guide or monitor treatment. Performed at [facility], [address] [place], [place] [address]   MRSA Next Gen by PCR, Nasal     Status: None   Collection Time: 04/28/24 10:42 AM   Specimen: Nasal Mucosa;  Nasal Swab  Result Value Ref Range Status   MRSA by PCR Next Gen NOT DETECTED NOT DETECTED Final    Comment: (NOTE) The GeneXpert MRSA Assay (FDA approved for NASAL specimens only), is one component of a comprehensive MRSA colonization surveillance program. It is not intended to diagnose MRSA infection nor to guide or monitor treatment for MRSA infections. Test performance is not FDA approved in patients less than 34 years old. Performed at [facility], [address] [place], [place] [address]       Radiology Studies: No results found.      LOS: 7 days   Frimy Uffelman [employer] on [email or web address]  05/04/2024, 10:44 AM

## 2024-05-05 ENCOUNTER — Encounter: Payer: Self-pay | Admitting: Family Medicine

## 2024-05-06 DIAGNOSIS — S72002D Fracture of unspecified part of neck of left femur, subsequent encounter for closed fracture with routine healing: Secondary | ICD-10-CM

## 2024-05-06 DIAGNOSIS — Z7901 Long term (current) use of anticoagulants: Secondary | ICD-10-CM | POA: Diagnosis not present

## 2024-05-06 DIAGNOSIS — I10 Essential (primary) hypertension: Secondary | ICD-10-CM | POA: Diagnosis not present

## 2024-05-06 DIAGNOSIS — G250 Essential tremor: Secondary | ICD-10-CM | POA: Diagnosis not present

## 2024-05-06 DIAGNOSIS — S52502D Unspecified fracture of the lower end of left radius, subsequent encounter for closed fracture with routine healing: Secondary | ICD-10-CM | POA: Diagnosis not present

## 2024-05-07 ENCOUNTER — Telehealth: Payer: Self-pay | Admitting: Orthopedic Surgery

## 2024-05-07 NOTE — Telephone Encounter (Signed)
 Dr. Merlinda Starling spoke with Dr. Curtiss Dowdy regarding this patient. Dr. Curtiss Dowdy fixed this patients hip and will also follow up on wrist during that time. No appointment with Ash at this time.

## 2024-05-07 NOTE — Telephone Encounter (Signed)
 Tina from [facility] called and needs to make a one week  f/u appointment for a left fracture wrist. [phone]

## 2024-05-07 NOTE — Telephone Encounter (Signed)
 Called and left voicemail for Monica Curry informing her of this.

## 2024-05-10 DIAGNOSIS — J040 Acute laryngitis: Secondary | ICD-10-CM | POA: Diagnosis not present

## 2024-05-10 DIAGNOSIS — R0982 Postnasal drip: Secondary | ICD-10-CM | POA: Diagnosis not present

## 2024-05-18 ENCOUNTER — Telehealth: Payer: Self-pay | Admitting: *Deleted

## 2024-05-18 NOTE — Telephone Encounter (Signed)
 Copied from CRM [id number]. Topic: Clinical - Medical Advice >> May 17, 2024 11:25 AM Keitha Pata L wrote: Reason for CRM: patient daughter called in and stated Mother had a fall and hospitalized  Missed a call from the nurse and wanted the doctor to know.

## 2024-06-07 DIAGNOSIS — I10 Essential (primary) hypertension: Secondary | ICD-10-CM | POA: Diagnosis not present

## 2024-06-07 DIAGNOSIS — G20A1 Parkinson's disease without dyskinesia, without mention of fluctuations: Secondary | ICD-10-CM | POA: Diagnosis not present

## 2024-06-08 DIAGNOSIS — S72002D Fracture of unspecified part of neck of left femur, subsequent encounter for closed fracture with routine healing: Secondary | ICD-10-CM | POA: Diagnosis not present

## 2024-06-09 ENCOUNTER — Telehealth: Payer: Self-pay

## 2024-06-09 NOTE — Transitions of Care (Post Inpatient/ED Visit) (Unsigned)
   06/09/2024  Name: Monica Curry MRN: [id number] DOB: Mar 08, 1931  Today's TOC FU Call Status: Today's TOC FU Call Status:: Unsuccessful Call (1st Attempt) Unsuccessful Call (1st Attempt) Date: 06/09/24  Attempted to reach the patient regarding the most recent Inpatient/ED visit.  Follow Up Plan: Additional outreach attempts will be made to reach the patient to complete the Transitions of Care (Post Inpatient/ED visit) call.   Signature Julian Lemmings, LPN [facility] Nurse Health Advisor Direct Dial [phone]

## 2024-06-10 NOTE — Transitions of Care (Post Inpatient/ED Visit) (Signed)
 06/10/2024  Name: Monica Curry MRN: [id number] DOB: [birth year]  Today's TOC FU Call Status: Today's TOC FU Call Status:: Successful TOC FU Call Completed Unsuccessful Call (1st Attempt) Date: 06/09/24 [facility] FU Call Complete Date: 06/10/24 Patient's Name and Date of Birth confirmed.  Transition Care Management Follow-up Telephone Call Date of Discharge: 06/08/24 Discharge Facility: Other [facility]) Name of Other ([facility]) Discharge Facility: [facility] Type of Discharge: Inpatient Admission Primary Inpatient Discharge Diagnosis:: fx femur How have you been since you were released from the hospital?: Better Any questions or concerns?: No  Items Reviewed: Did you receive and understand the discharge instructions provided?: Yes Medications obtained,verified, and reconciled?: Yes (Medications Reviewed) Any new allergies since your discharge?: No Dietary orders reviewed?: Yes Do you have support at home?: Yes People in Home [RPT]: child(ren), adult  Medications Reviewed Today: Medications Reviewed Today     Reviewed by Emmitt Pan, LPN (Licensed Practical Nurse) on 06/10/24 at 1226  Med List Status: <None>   Medication Order Taking? Sig Documenting Provider Last Dose Status Informant  acetaminophen  (TYLENOL ) 500 MG tablet [id number] Yes Take 500 mg by mouth every 6 (six) hours as needed for moderate pain. [provider]  Active Self  apixaban  (ELIQUIS ) 5 MG TABS tablet [id number] Yes Take 1 tablet (5 mg total) by mouth 2 (two) times daily. Wendolyn Jenkins Jansky, MD  Active   atorvastatin  (LIPITOR) 10 MG tablet [id number] Yes TAKE 1 TABLET BY MOUTH DAILY FOR CHOLESTEROL Wendolyn Jenkins Jansky, MD  Active   Calcium  Carb-Cholecalciferol  (CALCIUM  PLUS VITAMIN D  PO) [id number] Yes Take 1 tablet by mouth daily. 1 chewable daily [provider]  Active   Cyanocobalamin  500 MCG CHEW [id number] Yes Chew 1 tablet by mouth daily. [provider]  Active   docusate  sodium (COLACE) 100 MG capsule [id number] Yes Take 1 capsule (100 mg total) by mouth 2 (two) times daily. Krishnan, Gokul, MD  Active   escitalopram  (LEXAPRO ) 5 MG tablet [id number] Yes Take 1 tablet (5 mg total) by mouth daily. Wendolyn Jenkins Jansky, MD  Active   hydrOXYzine  (ATARAX ) 25 MG tablet [id number] Yes Take 1 tablet (25 mg total) by mouth daily. Wendolyn Jenkins Jansky, MD  Active   Magnesium  250 MG TABS [id number] Yes Take 250 mg by mouth 2 (two) times a day. [provider]  Active Self  methocarbamol  (ROBAXIN ) 500 MG tablet [id number] Yes Take 1 tablet (500 mg total) by mouth every 8 (eight) hours as needed for muscle spasms. Danton Lauraine LABOR, PA-C  Active   oxyCODONE  (OXY IR/ROXICODONE ) 5 MG immediate release tablet [id number] Yes Take 0.5-1 tablets (2.5-5 mg total) by mouth every 4 (four) hours as needed for moderate pain (pain score 4-6) or severe pain (pain score 7-10) (2.5 mg pain score 4-6, 5 mg pain score 7-10). Danton Lauraine LABOR, PA-C  Active   polyethylene glycol (MIRALAX  / GLYCOLAX ) 17 g packet [id number] Yes Take 17 g by mouth daily. Krishnan, Gokul, MD  Active   pramipexole  (MIRAPEX ) 0.125 MG tablet [id number] Yes Take 1 tablet (0.125 mg total) by mouth at bedtime. Krishnan, Gokul, MD  Active   propranolol  (INDERAL ) 40 MG tablet [id number] Yes Take 1 tablet (40 mg total) by mouth 4 (four) times daily. TAKE 1 TABLET BY MOUTH FOUR TIMES DAILY AT BEDTIME WITH MEALS FOR BLOOD PRESSURE OR TREMORS Wendolyn Jenkins Jansky, MD  Active   VITAMIN D  PO [id number] Yes Take 5,000 Units by mouth daily. 2 tablets daily [provider]  Active  Home Care and Equipment/Supplies: Were Home Health Services Ordered?: Yes Name of Home Health Agency:: unknown Has Agency set up a time to come to your home?: Yes First Home Health Visit Date: 06/13/24 Any new equipment or medical supplies ordered?: Yes Name of Medical supply agency?: unknown Were you able to get the equipment/medical  supplies?: Yes Do you have any questions related to the use of the equipment/supplies?: No  Functional Questionnaire: Do you need assistance with bathing/showering or dressing?: Yes Do you need assistance with meal preparation?: Yes Do you need assistance with eating?: No Do you have difficulty maintaining continence: No Do you need assistance with getting out of bed/getting out of a chair/moving?: Yes Do you have difficulty managing or taking your medications?: No  Follow up appointments reviewed: PCP Follow-up appointment confirmed?: Yes Date of PCP follow-up appointment?: 06/15/24 Follow-up Provider: [facility] Follow-up appointment confirmed?: Yes Date of Specialist follow-up appointment?: 07/01/24 Follow-Up Specialty Provider:: ortho Do you need transportation to your follow-up appointment?: No Do you understand care options if your condition(s) worsen?: Yes-patient verbalized understanding    SIGNATURE Julian Lemmings, LPN [facility] Nurse Health Advisor Direct Dial [phone]

## 2024-06-13 DIAGNOSIS — I10 Essential (primary) hypertension: Secondary | ICD-10-CM | POA: Diagnosis not present

## 2024-06-13 DIAGNOSIS — D649 Anemia, unspecified: Secondary | ICD-10-CM | POA: Diagnosis not present

## 2024-06-13 DIAGNOSIS — M80032D Age-related osteoporosis with current pathological fracture, left forearm, subsequent encounter for fracture with routine healing: Secondary | ICD-10-CM | POA: Diagnosis not present

## 2024-06-13 DIAGNOSIS — Z8542 Personal history of malignant neoplasm of other parts of uterus: Secondary | ICD-10-CM | POA: Diagnosis not present

## 2024-06-13 DIAGNOSIS — G250 Essential tremor: Secondary | ICD-10-CM | POA: Diagnosis not present

## 2024-06-13 DIAGNOSIS — F32A Depression, unspecified: Secondary | ICD-10-CM | POA: Diagnosis not present

## 2024-06-13 DIAGNOSIS — G2581 Restless legs syndrome: Secondary | ICD-10-CM | POA: Diagnosis not present

## 2024-06-13 DIAGNOSIS — M80052D Age-related osteoporosis with current pathological fracture, left femur, subsequent encounter for fracture with routine healing: Secondary | ICD-10-CM | POA: Diagnosis not present

## 2024-06-13 DIAGNOSIS — E782 Mixed hyperlipidemia: Secondary | ICD-10-CM | POA: Diagnosis not present

## 2024-06-14 ENCOUNTER — Telehealth: Payer: Self-pay | Admitting: *Deleted

## 2024-06-14 NOTE — Telephone Encounter (Signed)
 Copied from CRM [id number]. Topic: Clinical - Home Health Verbal Orders >> Jun 14, 2024 11:00 AM Burnard DEL wrote: Caller/Agency: Pamela[facility]  Callback Number: [id number] line) Service Requested: Skilled Nursing-Fracture repair Frequency: 1wk5  2PRN'S Any new concerns about the patient? Yes Hip looks good,swelling in left foot(thinks provider already knows about)

## 2024-06-15 ENCOUNTER — Encounter: Payer: Self-pay | Admitting: Family Medicine

## 2024-06-15 ENCOUNTER — Ambulatory Visit: Payer: Self-pay | Admitting: Family Medicine

## 2024-06-15 ENCOUNTER — Ambulatory Visit: Admitting: Family Medicine

## 2024-06-15 VITALS — BP 132/62 | HR 68 | Temp 98.0°F | Resp 18 | Ht 63.5 in | Wt 138.1 lb

## 2024-06-15 DIAGNOSIS — Z79899 Other long term (current) drug therapy: Secondary | ICD-10-CM

## 2024-06-15 DIAGNOSIS — M8000XA Age-related osteoporosis with current pathological fracture, unspecified site, initial encounter for fracture: Secondary | ICD-10-CM | POA: Diagnosis not present

## 2024-06-15 DIAGNOSIS — E559 Vitamin D deficiency, unspecified: Secondary | ICD-10-CM

## 2024-06-15 DIAGNOSIS — Z86718 Personal history of other venous thrombosis and embolism: Secondary | ICD-10-CM | POA: Diagnosis not present

## 2024-06-15 DIAGNOSIS — R600 Localized edema: Secondary | ICD-10-CM

## 2024-06-15 DIAGNOSIS — S72002D Fracture of unspecified part of neck of left femur, subsequent encounter for closed fracture with routine healing: Secondary | ICD-10-CM | POA: Diagnosis not present

## 2024-06-15 DIAGNOSIS — G2581 Restless legs syndrome: Secondary | ICD-10-CM | POA: Diagnosis not present

## 2024-06-15 DIAGNOSIS — S52532D Colles' fracture of left radius, subsequent encounter for closed fracture with routine healing: Secondary | ICD-10-CM

## 2024-06-15 DIAGNOSIS — R7989 Other specified abnormal findings of blood chemistry: Secondary | ICD-10-CM

## 2024-06-15 LAB — CBC WITH DIFFERENTIAL/PLATELET
Basophils Absolute: 0.1 K/uL (ref 0.0–0.1)
Basophils Relative: 1.2 % (ref 0.0–3.0)
Eosinophils Absolute: 0.3 K/uL (ref 0.0–0.7)
Eosinophils Relative: 3.7 % (ref 0.0–5.0)
HCT: 41.2 % (ref 36.0–46.0)
Hemoglobin: 13.4 g/dL (ref 12.0–15.0)
Lymphocytes Relative: 35.9 % (ref 12.0–46.0)
Lymphs Abs: 2.8 K/uL (ref 0.7–4.0)
MCHC: 32.4 g/dL (ref 30.0–36.0)
MCV: 85.7 fl (ref 78.0–100.0)
Monocytes Absolute: 0.7 K/uL (ref 0.1–1.0)
Monocytes Relative: 8.4 % (ref 3.0–12.0)
Neutro Abs: 4 K/uL (ref 1.4–7.7)
Neutrophils Relative %: 50.8 % (ref 43.0–77.0)
Platelets: 512 K/uL — ABNORMAL HIGH (ref 150.0–400.0)
RBC: 4.81 Mil/uL (ref 3.87–5.11)
RDW: 16 % — ABNORMAL HIGH (ref 11.5–15.5)
WBC: 7.9 K/uL (ref 4.0–10.5)

## 2024-06-15 LAB — COMPREHENSIVE METABOLIC PANEL WITH GFR
ALT: 10 U/L (ref 0–35)
AST: 19 U/L (ref 0–37)
Albumin: 4 g/dL (ref 3.5–5.2)
Alkaline Phosphatase: 96 U/L (ref 39–117)
BUN: 28 mg/dL — ABNORMAL HIGH (ref 6–23)
CO2: 26 meq/L (ref 19–32)
Calcium: 9.5 mg/dL (ref 8.4–10.5)
Chloride: 100 meq/L (ref 96–112)
Creatinine, Ser: 1.14 mg/dL (ref 0.40–1.20)
GFR: 42.27 mL/min — ABNORMAL LOW (ref >60.00)
Glucose, Bld: 89 mg/dL (ref 70–99)
Potassium: 4.6 meq/L (ref 3.5–5.1)
Sodium: 133 meq/L — ABNORMAL LOW (ref 135–145)
Total Bilirubin: 0.5 mg/dL (ref 0.2–1.2)
Total Protein: 6.4 g/dL (ref 6.0–8.3)

## 2024-06-15 MED ORDER — PRAMIPEXOLE DIHYDROCHLORIDE 0.125 MG PO TABS: 0.1250 mg | ORAL_TABLET | Freq: Every day | ORAL | 1 refills | Status: DC

## 2024-06-15 NOTE — Patient Instructions (Signed)
 Keep up the great work  Working on [place]  for the osteoporosis

## 2024-06-15 NOTE — Telephone Encounter (Signed)
 Spoke to [name] at [facility] at [phone], Development worker, international aid for Monica Curry due to incorrect number provided for Monica Curry. Verbal orders given to [name] as requested per pcp. Monica Curry correct number is [phone].

## 2024-06-15 NOTE — Progress Notes (Signed)
 Subjective:     Patient ID: Monica Curry, female    DOB: [birth year], 89 y.o.   MRN: [id number]  Chief Complaint  Patient presents with   Hospitalization Follow-up    Follow-up from hospital and skilled nursing facility after fall on 04/27/24 Discuss medications    HPI 5/20-5/27-L hip and L distal radius Discussed the use of AI scribe software for clinical note transcription with the patient, who gave verbal consent to proceed.  History of Present Illness Monice Lundy is a 89 year old female with osteoporosis who presents for follow-up after a fall resulting in a left hip and wrist fracture. She is accompanied by her daughter, who is staying with her to assist with her care.  On May 20th, she experienced a fall while working in her backyard, resulting in fractures of the left hip and left wrist. She was spray painting chairs when she tripped over obstacles and fell backwards, instinctively reaching out her hand. She immediately suspected a broken wrist and hip due to the position of her legs and the inability to move them. She used her emergency pendant to call for help, as she was unable to reach her phone. The police arrived after she repeatedly pressed the pendant. She was taken to the hospital, where she underwent surgery for her left hip fracture. The wrist fracture was reduced in the emergency room and later placed in a brace after the cast was removed on June 24th.  She was discharged from rehab a week ago and is currently using a walker and a hemi walker for mobility. She experiences discomfort at the site of the hip incision, particularly after showering. She has leg spasms and swelling in her foot and leg(chronic), which she manages with compression socks and gripper socks for safety. The swelling reduces overnight. No dizziness, shortness of breath, or chest pains.  She is currently taking Mirapex  for restless legs, which has been effective. She has discontinued oxycodone  and  methocarbamol . She takes calcium  with vitamin D  . She also takes B12 daily and magnesium  occasionally to avoid diarrhea.  She has a history of osteoporosis and has previously taken Fosamax and Boniva. Her bone density was last checked in January. She has a history of venous insufficiency and previous pulmonary embolism, for which she remains on blood thinners.  Her daughter, who lives in [place] , is currently staying with her to assist with her care. She is awaiting the start of home physical and occupational therapy, which has been delayed since her discharge from rehab.    Health Maintenance Due  Topic Date Due   Medicare Annual Wellness (AWV)  03/18/2024    Past Medical History:  Diagnosis Date   Allergy    Medications and pollen   Anemia    Anxiety    Always   C. difficile colitis    Cancer (HCC)    Endometrial cancer   Cataract    Had surgery 2021   Chronic kidney disease 2018   2 siblings had kidney failure, sister had transplant   Clotting disorder (HCC)    Essential and other specified forms of tremor    Hyperlipidemia    Hypertension    Osteoporosis 11/2019   T score -2.8 stable from prior DEXA   Personal history of venous thrombosis and embolism 1997   Pulmonary embolism (HCC) 06/14/2021   Pulmonary embolus (HCC)    Shingles    Stroke (HCC) 2018   TIA   Thrush    TIA (  transient ischemic attack)    carotid US  07/2022: no ICA stenosis bilaterally    Past Surgical History:  Procedure Laterality Date   ABDOMINAL HYSTERECTOMY  1997   APPENDECTOMY  1961   cataract surg Bilateral 2021   Dr. Roz, R eye 8/18, L 8/25   CESAREAN SECTION  1961   EYE SURGERY  2021   Cataract both eyes   FRACTURE SURGERY  2025   Broken hip   INTRAMEDULLARY (IM) NAIL INTERTROCHANTERIC Left 04/28/2024   Procedure: FIXATION, FRACTURE, INTERTROCHANTERIC, WITH INTRAMEDULLARY ROD;  Surgeon: Kendal Franky SQUIBB, MD;  Location: [facility] OR;  Service: Orthopedics;  Laterality: Left;    TONSILLECTOMY AND ADENOIDECTOMY  1944   TOTAL ABDOMINAL HYSTERECTOMY W/ BILATERAL SALPINGOOPHORECTOMY  1997     Current Outpatient Medications:    acetaminophen  (TYLENOL ) 500 MG tablet, Take 500 mg by mouth every 6 (six) hours as needed for moderate pain., Disp: , Rfl:    apixaban  (ELIQUIS ) 5 MG TABS tablet, Take 1 tablet (5 mg total) by mouth 2 (two) times daily., Disp: 180 tablet, Rfl: 3   atorvastatin  (LIPITOR) 10 MG tablet, TAKE 1 TABLET BY MOUTH DAILY FOR CHOLESTEROL, Disp: 90 tablet, Rfl: 3   Calcium  Carb-Cholecalciferol  (CALCIUM  PLUS VITAMIN D  PO), Take 1 tablet by mouth daily. 1 chewable daily, Disp: , Rfl:    Cyanocobalamin  500 MCG CHEW, Chew 1 tablet by mouth daily., Disp: , Rfl:    escitalopram  (LEXAPRO ) 5 MG tablet, Take 1 tablet (5 mg total) by mouth daily., Disp: 90 tablet, Rfl: 1   hydrOXYzine  (ATARAX ) 25 MG tablet, Take 1 tablet (25 mg total) by mouth daily., Disp: 90 tablet, Rfl: 1   propranolol  (INDERAL ) 40 MG tablet, Take 1 tablet (40 mg total) by mouth 4 (four) times daily. TAKE 1 TABLET BY MOUTH FOUR TIMES DAILY AT BEDTIME WITH MEALS FOR BLOOD PRESSURE OR TREMORS, Disp: 360 tablet, Rfl: 3   VITAMIN D  PO, Take 5,000 Units by mouth daily. 2 tablets daily, Disp: , Rfl:    pramipexole  (MIRAPEX ) 0.125 MG tablet, Take 1 tablet (0.125 mg total) by mouth at bedtime., Disp: 90 tablet, Rfl: 1  Allergies  Allergen Reactions   Codeine Hives   Other Other (See Comments)    Reaction: Fever (intolerance)   Cortisone     Causes jitteriness.   Dexamethasone      anxiety   Hctz [Hydrochlorothiazide ] Other (See Comments)    Low sodium   Meloxicam  Nausea Only    Gastritis and nausea   Mysoline [Primidone]    Pneumovax 23 [Pneumococcal Vac Polyvalent]     Red, raised area.  Knot at site of injection   Prednisone      Anxiety   Topiramate      Sleepiness, numbness feet tingling in arm.   Toradol  [Ketorolac  Tromethamine ] Swelling    Localized redness at injection site.     Ace  Inhibitors Cough   ROS neg/noncontributory except as noted HPI/below      Objective:     BP 132/62 (BP Location: Right Arm, Cuff Size: Normal)   Pulse 68   Temp 98 F (36.7 C) (Temporal)   Resp 18   Ht 5' 3.5 (1.613 m)   Wt 138 lb 2 oz (62.7 kg)   SpO2 96%   BMI 24.08 kg/m  Wt Readings from Last 3 Encounters:  06/15/24 138 lb 2 oz (62.7 kg)  04/28/24 138 lb (62.6 kg)  04/01/24 140 lb (63.5 kg)    Physical Exam   Gen: WDWN NAD  HEENT: NCAT, conjunctiva not injected, sclera nonicteric NECK:  supple, no thyromegaly, no nodes, no carotid bruits CARDIAC: RRR, S1S2+, no murmur. DP 2+B LUNGS: CTAB. No wheezes ABDOMEN:  BS+, soft, NTND, No HSM, no masses EXT:  +LLE edema lower 1/3 MSK: no gross abnormalities.  Using walker.  No s/s infection healed surg sites on L hip area.  Wearing splint L wrist.  Callus building NEURO: A&O x3.  CN II-XII intact. Some tremors PSYCH: normal mood. Good eye contact  45 min spent reviewing chart, caring for pt, discussing plan.      Assessment & Plan:  Closed fracture of left hip with routine healing, subsequent encounter  Closed Colles' fracture of left radius with routine healing, subsequent encounter  Age-related osteoporosis with current pathological fracture, initial encounter  RLS (restless legs syndrome)  Hx/o DVT/PE  Vitamin D  deficiency  High risk medication use -     CBC with Differential/Platelet -     Comprehensive metabolic panel with GFR  Other orders -     Pramipexole  Dihydrochloride; Take 1 tablet (0.125 mg total) by mouth at bedtime.  Dispense: 90 tablet; Refill: 1  Assessment and Plan Assessment & Plan Left Hip Fracture   She sustained a left hip fracture on Apr 27, 2024, due to a fall and underwent surgical fixation with nails and screws. Post-operative recovery is positive with good incision healing, though she experiences discomfort at the incision site likely due to healing and recent activity. There are no  signs of infection or drainage. Her mobility has significantly improved with the use of a walker and hemi walker. Delayed home health services have impacted the initiation of physical and occupational therapy. She should continue using the walker and hemi walker for mobility, monitor the incision site for infection or complications, and continue physical and occupational therapy at home. Contact home health to expedite therapy services.  Left Wrist Fracture   She sustained a fracture of the left radius and ulna during the fall. The fracture was reduced in the emergency room and placed in a sugar wrap. Surgical intervention was avoided due to osteoporosis and potential complications. She currently wears a brace, which is removed for exercises and showering. The main concern is wrist mobility, with potential for deformity due to non-surgical management. She should continue wearing the brace as instructed, perform wrist exercises to improve mobility, and follow up with the orthopedic surgeon on June 29, 2024.  Osteoporosis   Her osteoporosis has been previously treated with Fosamax and Boniva. Recent fractures highlight the need for active management. Evenity , a monoclonal antibody, was discussed to promote bone growth post-fracture, with a transition to Prolia after one year. Consideration of cost and insurance approval is necessary. Evenity  is preferred for efficacy, but cost may necessitate a return to Fosamax. Initiate Evenity  injections pending insurance approval, monitor the approval process, and consider Prolia after one year of Evenity . If Evenity  is cost-prohibitive, consider returning to Fosamax.  Restless Legs Syndrome   She experiences restless legs post-surgery and started on low-dose Mirapex , which is effective in managing symptoms without significant side effects. The decision was influenced by her brother's experience and a hospital doctor's recommendation. Continue Mirapex  at the current dose  and monitor for side effects and efficacy.  Venous Insufficiency   She experiences swelling in her feet and legs, particularly in the morning, which reduces overnight. Compression socks were suggested but not used due to slipping concerns. Venous insufficiency likely contributes to the swelling. Flexion and extension exercises  are recommended to improve venous return. Consider using gripper socks over compression stockings, perform flexion and extension exercises, and explore footwear options that accommodate swelling.  Vitamin D  and Calcium  Supplementation   She was advised to reduce vitamin D  intake due to high levels. Current supplementation includes calcium  with vitamin D , and magnesium  supplementation was reduced due to diarrhea. Her current vitamin D  intake is higher than necessary. Reduce vitamin D  supplementation to [phone] IU daily, discontinue magnesium  supplementation unless needed for constipation, and monitor calcium  and vitamin D  intake to avoid excessive levels.  Follow-up   Follow-up plans were discussed for various conditions and treatments. Blood work to assess kidney function was not performed at rehab, necessitating re-evaluation. Follow up with the orthopedic surgeon on June 29, 2024, monitor the insurance approval process for Evenity , recheck blood work to assess kidney function, and visit the [facility] for a handicap placard application.   [facility] Return for as sch in oct.  Jenkins CHRISTELLA Carrel, MD

## 2024-06-15 NOTE — Progress Notes (Signed)
 Sodium slightly low-could be from lexapro .  Repeat bmp in 3-4 wks Platelets are a little high-has been intermitt.  Repeat cbcd, iron studies, B12 in 3-4 wks.

## 2024-06-16 ENCOUNTER — Telehealth: Payer: Self-pay

## 2024-06-16 ENCOUNTER — Other Ambulatory Visit: Payer: Self-pay | Admitting: *Deleted

## 2024-06-16 DIAGNOSIS — M8000XA Age-related osteoporosis with current pathological fracture, unspecified site, initial encounter for fracture: Secondary | ICD-10-CM

## 2024-06-16 MED ORDER — ROMOSOZUMAB-AQQG 105 MG/1.17ML ~~LOC~~ SOSY: 210.0000 mg | PREFILLED_SYRINGE | Freq: Once | SUBCUTANEOUS | Status: DC

## 2024-06-16 NOTE — Telephone Encounter (Addendum)
    Following Medicare Advantage guideline: 20% coinsurance, 20% admin fee

## 2024-06-16 NOTE — Telephone Encounter (Signed)
 Evenity VOB initiated via [email or web address]  Next Evenity inj DUE: NEW START

## 2024-06-16 NOTE — Addendum Note (Signed)
 Addended by: WENDOLYN JENKINS HERO on: 06/16/2024 05:49 PM   Modules accepted: Orders

## 2024-06-17 ENCOUNTER — Other Ambulatory Visit (HOSPITAL_COMMUNITY): Payer: Self-pay

## 2024-06-17 ENCOUNTER — Telehealth: Payer: Self-pay

## 2024-06-17 DIAGNOSIS — F32A Depression, unspecified: Secondary | ICD-10-CM | POA: Diagnosis not present

## 2024-06-17 DIAGNOSIS — E782 Mixed hyperlipidemia: Secondary | ICD-10-CM | POA: Diagnosis not present

## 2024-06-17 DIAGNOSIS — I10 Essential (primary) hypertension: Secondary | ICD-10-CM | POA: Diagnosis not present

## 2024-06-17 DIAGNOSIS — G2581 Restless legs syndrome: Secondary | ICD-10-CM | POA: Diagnosis not present

## 2024-06-17 DIAGNOSIS — M80032D Age-related osteoporosis with current pathological fracture, left forearm, subsequent encounter for fracture with routine healing: Secondary | ICD-10-CM | POA: Diagnosis not present

## 2024-06-17 DIAGNOSIS — Z8542 Personal history of malignant neoplasm of other parts of uterus: Secondary | ICD-10-CM | POA: Diagnosis not present

## 2024-06-17 DIAGNOSIS — G250 Essential tremor: Secondary | ICD-10-CM | POA: Diagnosis not present

## 2024-06-17 DIAGNOSIS — D649 Anemia, unspecified: Secondary | ICD-10-CM | POA: Diagnosis not present

## 2024-06-17 DIAGNOSIS — M80052D Age-related osteoporosis with current pathological fracture, left femur, subsequent encounter for fracture with routine healing: Secondary | ICD-10-CM | POA: Diagnosis not present

## 2024-06-17 NOTE — Telephone Encounter (Signed)
 MEDICAL PA submitted via CMM. Key: [id number]   PHARMACY PA submitted via CMM. Key: [id number]

## 2024-06-17 NOTE — Telephone Encounter (Signed)
 PHARMACY PA   DENIED -

## 2024-06-17 NOTE — Telephone Encounter (Signed)
 Copied from CRM [id number]. Topic: Clinical - Home Health Verbal Orders >> Jun 17, 2024  2:01 PM [place] J wrote: Caller/Agency: [facility] Callback Number: [phone] Service Requested: Physical Therapy Frequency: 2 week 4 ; 1 week 4 Any new concerns about the patient? No *Voicemail box is secured and verbal orders are able to be communicated through there as well.*  Please see msg and advise on verbal orders for patient.

## 2024-06-18 ENCOUNTER — Ambulatory Visit (HOSPITAL_COMMUNITY)
Admission: RE | Admit: 2024-06-18 | Discharge: 2024-06-18 | Disposition: A | Discharge status: Home / Self Care | Source: Ambulatory Visit | Attending: Family Medicine | Admitting: Family Medicine

## 2024-06-18 DIAGNOSIS — R600 Localized edema: Secondary | ICD-10-CM | POA: Diagnosis not present

## 2024-06-18 DIAGNOSIS — Z86718 Personal history of other venous thrombosis and embolism: Secondary | ICD-10-CM

## 2024-06-18 NOTE — Telephone Encounter (Signed)
 MEDICAL PA   Received notification from [employer] that Prior Authorization for EVENITY  has been APPROVED from 06/17/24 to 12/08/24   PA #/Case ID/Reference #: [id number]

## 2024-06-18 NOTE — Telephone Encounter (Signed)
 Pt ready for scheduling for EVENITY  on or after : 06/18/24  Option# 1 Buy/Bill (Office supplied medication)  Out-of-pocket cost due at time of  office visit: $507.41 ([employer] cannot provide benefit information, following Medicare Advantage guideline)  Number of injection/visits approved: 12  Primary: [employer] Evenity  co-insurance: 20% Admin fee co-insurance: 20%  Secondary: --- Evenity  co-insurance:  Admin fee co-insurance:   Medical Benefit Details: Date Benefits were checked: 06/16/24 Deductible: NO/ Coinsurance: 20%/ Admin Fee: 20%  Prior Auth: APPROVED PA# [id number] Expiration Date: 06/17/24-12/08/24   # of doses approved: 12 ------------------------------------------------------------------------- Option# 2- Med Obtained from pharmacy  Pharmacy benefit: Copay $--- (Paid to pharmacy) Admin Fee: --- (Pay at clinic)  Prior Auth: DENIED PA# Expiration Date:   # of doses approved:  If patient wants fill through the pharmacy benefit please send prescription to: ---, and include estimated need by date in rx notes. Pharmacy will ship medication directly to the office.  Patient NOT eligible for Evenity  Copay Card. Copay Card can make patient's cost as little as $25. Link to apply: [email or web address]   This summary of benefits is an estimation of the patient's out-of-pocket cost. Exact cost may very based on individual plan coverage.

## 2024-06-19 NOTE — Progress Notes (Signed)
 No blood clot.  Try compression stockings

## 2024-06-21 DIAGNOSIS — Z8542 Personal history of malignant neoplasm of other parts of uterus: Secondary | ICD-10-CM | POA: Diagnosis not present

## 2024-06-21 DIAGNOSIS — G250 Essential tremor: Secondary | ICD-10-CM | POA: Diagnosis not present

## 2024-06-21 DIAGNOSIS — M80052D Age-related osteoporosis with current pathological fracture, left femur, subsequent encounter for fracture with routine healing: Secondary | ICD-10-CM | POA: Diagnosis not present

## 2024-06-21 DIAGNOSIS — F32A Depression, unspecified: Secondary | ICD-10-CM | POA: Diagnosis not present

## 2024-06-21 DIAGNOSIS — G2581 Restless legs syndrome: Secondary | ICD-10-CM | POA: Diagnosis not present

## 2024-06-21 DIAGNOSIS — M80032D Age-related osteoporosis with current pathological fracture, left forearm, subsequent encounter for fracture with routine healing: Secondary | ICD-10-CM | POA: Diagnosis not present

## 2024-06-21 DIAGNOSIS — D649 Anemia, unspecified: Secondary | ICD-10-CM | POA: Diagnosis not present

## 2024-06-21 DIAGNOSIS — E782 Mixed hyperlipidemia: Secondary | ICD-10-CM | POA: Diagnosis not present

## 2024-06-21 DIAGNOSIS — I10 Essential (primary) hypertension: Secondary | ICD-10-CM | POA: Diagnosis not present

## 2024-06-22 ENCOUNTER — Telehealth: Payer: Self-pay | Admitting: *Deleted

## 2024-06-22 NOTE — Telephone Encounter (Signed)
 Copied from CRM [id number]. Topic: Clinical - Medication Question >> Jun 22, 2024  4:19 PM Burnard DEL wrote: Reason for CRM: Patient called in stating that the evenity  injection cost out of pocket would be 250 per month. She would like to know if there maybe an alternative injection/medication that maybe less expensive for her? She would just like to know what her options may be? Please advise.

## 2024-06-23 ENCOUNTER — Telehealth: Payer: Self-pay

## 2024-06-23 ENCOUNTER — Other Ambulatory Visit: Payer: Self-pay | Admitting: *Deleted

## 2024-06-23 DIAGNOSIS — E782 Mixed hyperlipidemia: Secondary | ICD-10-CM | POA: Diagnosis not present

## 2024-06-23 DIAGNOSIS — F32A Depression, unspecified: Secondary | ICD-10-CM | POA: Diagnosis not present

## 2024-06-23 DIAGNOSIS — M80032D Age-related osteoporosis with current pathological fracture, left forearm, subsequent encounter for fracture with routine healing: Secondary | ICD-10-CM | POA: Diagnosis not present

## 2024-06-23 DIAGNOSIS — M8000XA Age-related osteoporosis with current pathological fracture, unspecified site, initial encounter for fracture: Secondary | ICD-10-CM

## 2024-06-23 DIAGNOSIS — G250 Essential tremor: Secondary | ICD-10-CM | POA: Diagnosis not present

## 2024-06-23 DIAGNOSIS — I10 Essential (primary) hypertension: Secondary | ICD-10-CM | POA: Diagnosis not present

## 2024-06-23 DIAGNOSIS — M80052D Age-related osteoporosis with current pathological fracture, left femur, subsequent encounter for fracture with routine healing: Secondary | ICD-10-CM | POA: Diagnosis not present

## 2024-06-23 DIAGNOSIS — G2581 Restless legs syndrome: Secondary | ICD-10-CM | POA: Diagnosis not present

## 2024-06-23 DIAGNOSIS — D649 Anemia, unspecified: Secondary | ICD-10-CM | POA: Diagnosis not present

## 2024-06-23 DIAGNOSIS — Z8542 Personal history of malignant neoplasm of other parts of uterus: Secondary | ICD-10-CM | POA: Diagnosis not present

## 2024-06-23 MED ORDER — DENOSUMAB 60 MG/ML ~~LOC~~ SOSY
60.0000 mg | PREFILLED_SYRINGE | SUBCUTANEOUS | Status: AC
  Administered 2024-07-15: 60 mg via SUBCUTANEOUS

## 2024-06-23 NOTE — Telephone Encounter (Signed)
 Prolia VOB initiated via [email or web address]  Next Prolia inj DUE: NEW START

## 2024-06-23 NOTE — Telephone Encounter (Signed)
Order placed for prolia

## 2024-06-24 ENCOUNTER — Other Ambulatory Visit (HOSPITAL_COMMUNITY): Payer: Self-pay

## 2024-06-24 NOTE — Telephone Encounter (Signed)
 PA for buy and bill submitted via CMM. Key: [id number]   Pharmacy benefit: [phone]

## 2024-06-24 NOTE — Telephone Encounter (Signed)
 Pt ready for scheduling for PROLIA  on or after : 06/24/24  Option# 1: Buy/Bill (Office supplied medication)  Out-of-pocket cost due at time of clinic visit: $357  Number of injection/visits approved: 2  Primary: [employer] Prolia  co-insurance: 20% Admin fee co-insurance: 20%  Secondary: --- Prolia  co-insurance:  Admin fee co-insurance:   Medical Benefit Details: Date Benefits were checked: 06/23/24 Deductible: NO/ Coinsurance: 20%/ Admin Fee: 20%  Prior Auth: APPROVED PA# [id number] Expiration Date: 06/24/24-12/08/24  # of doses approved: 2 ----------------------------------------------------------------------- Option# 2- Med Obtained from pharmacy:  Pharmacy benefit: Copay $64 (Paid to pharmacy) Admin Fee: 20% (Pay at clinic)  Prior Auth: N/A PA# Expiration Date:   # of doses approved:   If patient wants fill through the pharmacy benefit please send prescription to: [facility], and include estimated need by date in rx notes. Pharmacy will ship medication directly to the office.  Patient NOT eligible for Prolia  Copay Card. Copay Card can make patient's cost as little as $25. Link to apply: [email or web address]  ** This summary of benefits is an estimation of the patient's out-of-pocket cost. Exact cost may very based on individual plan coverage.

## 2024-06-24 NOTE — Telephone Encounter (Signed)
 Pharmacy Patient Advocate Encounter  Received notification from [employer] that Prior Authorization for PROLIA  has been APPROVED from 06/24/24 to 12/08/24   PA #/Case ID/Reference #: [id number]

## 2024-06-24 NOTE — Telephone Encounter (Signed)
    Following Medicare Advantage guideline: 20% coinsurance, 20% admin fee

## 2024-06-25 DIAGNOSIS — E782 Mixed hyperlipidemia: Secondary | ICD-10-CM | POA: Diagnosis not present

## 2024-06-25 DIAGNOSIS — Z8542 Personal history of malignant neoplasm of other parts of uterus: Secondary | ICD-10-CM | POA: Diagnosis not present

## 2024-06-25 DIAGNOSIS — G250 Essential tremor: Secondary | ICD-10-CM | POA: Diagnosis not present

## 2024-06-25 DIAGNOSIS — I10 Essential (primary) hypertension: Secondary | ICD-10-CM | POA: Diagnosis not present

## 2024-06-25 DIAGNOSIS — M80052D Age-related osteoporosis with current pathological fracture, left femur, subsequent encounter for fracture with routine healing: Secondary | ICD-10-CM | POA: Diagnosis not present

## 2024-06-25 DIAGNOSIS — G2581 Restless legs syndrome: Secondary | ICD-10-CM | POA: Diagnosis not present

## 2024-06-25 DIAGNOSIS — D649 Anemia, unspecified: Secondary | ICD-10-CM | POA: Diagnosis not present

## 2024-06-25 DIAGNOSIS — F32A Depression, unspecified: Secondary | ICD-10-CM | POA: Diagnosis not present

## 2024-06-25 DIAGNOSIS — M80032D Age-related osteoporosis with current pathological fracture, left forearm, subsequent encounter for fracture with routine healing: Secondary | ICD-10-CM | POA: Diagnosis not present

## 2024-06-29 DIAGNOSIS — G2581 Restless legs syndrome: Secondary | ICD-10-CM | POA: Diagnosis not present

## 2024-06-29 DIAGNOSIS — S72142D Displaced intertrochanteric fracture of left femur, subsequent encounter for closed fracture with routine healing: Secondary | ICD-10-CM | POA: Diagnosis not present

## 2024-06-29 DIAGNOSIS — F32A Depression, unspecified: Secondary | ICD-10-CM | POA: Diagnosis not present

## 2024-06-29 DIAGNOSIS — D649 Anemia, unspecified: Secondary | ICD-10-CM | POA: Diagnosis not present

## 2024-06-29 DIAGNOSIS — G250 Essential tremor: Secondary | ICD-10-CM | POA: Diagnosis not present

## 2024-06-29 DIAGNOSIS — Z8542 Personal history of malignant neoplasm of other parts of uterus: Secondary | ICD-10-CM | POA: Diagnosis not present

## 2024-06-29 DIAGNOSIS — M80052D Age-related osteoporosis with current pathological fracture, left femur, subsequent encounter for fracture with routine healing: Secondary | ICD-10-CM | POA: Diagnosis not present

## 2024-06-29 DIAGNOSIS — I10 Essential (primary) hypertension: Secondary | ICD-10-CM | POA: Diagnosis not present

## 2024-06-29 DIAGNOSIS — M80032D Age-related osteoporosis with current pathological fracture, left forearm, subsequent encounter for fracture with routine healing: Secondary | ICD-10-CM | POA: Diagnosis not present

## 2024-06-29 DIAGNOSIS — S52532D Colles' fracture of left radius, subsequent encounter for closed fracture with routine healing: Secondary | ICD-10-CM | POA: Diagnosis not present

## 2024-06-29 DIAGNOSIS — E782 Mixed hyperlipidemia: Secondary | ICD-10-CM | POA: Diagnosis not present

## 2024-06-30 ENCOUNTER — Other Ambulatory Visit: Payer: Self-pay | Admitting: *Deleted

## 2024-06-30 DIAGNOSIS — M8000XA Age-related osteoporosis with current pathological fracture, unspecified site, initial encounter for fracture: Secondary | ICD-10-CM

## 2024-06-30 NOTE — Telephone Encounter (Unsigned)
 Copied from CRM [id number]. Topic: Clinical - Medication Question >> Jun 30, 2024 12:36 PM Monica Curry wrote: Reason for CRM: patient is calling to get an understanding of her Co-Pay of evinity medication and would like ashley to give her a call and go over it with her

## 2024-06-30 NOTE — Telephone Encounter (Signed)
 Tried calling patient on daughter's number and she stated to call her mom on [phone]. Unable to get in touch with patient.

## 2024-06-30 NOTE — Telephone Encounter (Signed)
 Called number listed on patient's profile. Phone number is to [employer]. Not sure how to contact patient. But copay is fairly simple to explain to her if she calls back.   Evenity  copay is approximately $507 through buy and bill.  Prolia  copay is approximately $357 for buy and bill OR patient can get Prolia  filled at pharmacy. Her copay at pharmacy will be $64 and 20% admin fee (around $25) at office.   Again, [employer] does not give actual benefits so the buy and bill quotes are following Medicare guidelines.

## 2024-07-01 ENCOUNTER — Encounter: Payer: Self-pay | Admitting: Family Medicine

## 2024-07-01 DIAGNOSIS — M80032D Age-related osteoporosis with current pathological fracture, left forearm, subsequent encounter for fracture with routine healing: Secondary | ICD-10-CM | POA: Diagnosis not present

## 2024-07-01 DIAGNOSIS — D649 Anemia, unspecified: Secondary | ICD-10-CM | POA: Diagnosis not present

## 2024-07-01 DIAGNOSIS — F32A Depression, unspecified: Secondary | ICD-10-CM | POA: Diagnosis not present

## 2024-07-01 DIAGNOSIS — Z8542 Personal history of malignant neoplasm of other parts of uterus: Secondary | ICD-10-CM | POA: Diagnosis not present

## 2024-07-01 DIAGNOSIS — G2581 Restless legs syndrome: Secondary | ICD-10-CM | POA: Diagnosis not present

## 2024-07-01 DIAGNOSIS — G250 Essential tremor: Secondary | ICD-10-CM | POA: Diagnosis not present

## 2024-07-01 DIAGNOSIS — E782 Mixed hyperlipidemia: Secondary | ICD-10-CM | POA: Diagnosis not present

## 2024-07-01 DIAGNOSIS — M80052D Age-related osteoporosis with current pathological fracture, left femur, subsequent encounter for fracture with routine healing: Secondary | ICD-10-CM | POA: Diagnosis not present

## 2024-07-01 DIAGNOSIS — I10 Essential (primary) hypertension: Secondary | ICD-10-CM | POA: Diagnosis not present

## 2024-07-01 NOTE — Telephone Encounter (Signed)
 Spoke with patient about messages below. Tried to explain to the best of my ability. The patient still has questions on why [employer] is telling her one thing and we are telling her something different. Patient would like to speak with someone in billing or someone that can tell her more about the cost she would have to pay out of pocket. Can you please assist patient? [facility] placed to the [facility].

## 2024-07-01 NOTE — Telephone Encounter (Signed)
 PA team can only give an estimation of the patient's out-of-pocket cost. Exact cost may vary based on individual plan coverage. As stated before, [employer] will not give us  the patient's cost for Prolia  or Evenity . The estimation is following Medicare advantage guidelines (20%).   Office will have to reach out to patient to discuss cost/options.

## 2024-07-02 ENCOUNTER — Telehealth: Payer: Self-pay | Admitting: Family Medicine

## 2024-07-02 DIAGNOSIS — G250 Essential tremor: Secondary | ICD-10-CM | POA: Diagnosis not present

## 2024-07-02 DIAGNOSIS — Z8542 Personal history of malignant neoplasm of other parts of uterus: Secondary | ICD-10-CM | POA: Diagnosis not present

## 2024-07-02 DIAGNOSIS — F32A Depression, unspecified: Secondary | ICD-10-CM | POA: Diagnosis not present

## 2024-07-02 DIAGNOSIS — M80032D Age-related osteoporosis with current pathological fracture, left forearm, subsequent encounter for fracture with routine healing: Secondary | ICD-10-CM | POA: Diagnosis not present

## 2024-07-02 DIAGNOSIS — G2581 Restless legs syndrome: Secondary | ICD-10-CM | POA: Diagnosis not present

## 2024-07-02 DIAGNOSIS — I10 Essential (primary) hypertension: Secondary | ICD-10-CM | POA: Diagnosis not present

## 2024-07-02 DIAGNOSIS — D649 Anemia, unspecified: Secondary | ICD-10-CM | POA: Diagnosis not present

## 2024-07-02 DIAGNOSIS — M80052D Age-related osteoporosis with current pathological fracture, left femur, subsequent encounter for fracture with routine healing: Secondary | ICD-10-CM | POA: Diagnosis not present

## 2024-07-02 DIAGNOSIS — E782 Mixed hyperlipidemia: Secondary | ICD-10-CM | POA: Diagnosis not present

## 2024-07-02 NOTE — Telephone Encounter (Signed)
 [facility] faxed document Home Health Certificate (Order ID [id number]), to be filled out by provider. Patient requested to send it back via Fax within ASAP. Document is located in providers tray at front office.Please advise at [phone].

## 2024-07-05 DIAGNOSIS — Z8542 Personal history of malignant neoplasm of other parts of uterus: Secondary | ICD-10-CM | POA: Diagnosis not present

## 2024-07-05 DIAGNOSIS — I10 Essential (primary) hypertension: Secondary | ICD-10-CM | POA: Diagnosis not present

## 2024-07-05 DIAGNOSIS — G250 Essential tremor: Secondary | ICD-10-CM | POA: Diagnosis not present

## 2024-07-05 DIAGNOSIS — M80032D Age-related osteoporosis with current pathological fracture, left forearm, subsequent encounter for fracture with routine healing: Secondary | ICD-10-CM | POA: Diagnosis not present

## 2024-07-05 DIAGNOSIS — M80052D Age-related osteoporosis with current pathological fracture, left femur, subsequent encounter for fracture with routine healing: Secondary | ICD-10-CM | POA: Diagnosis not present

## 2024-07-05 DIAGNOSIS — G2581 Restless legs syndrome: Secondary | ICD-10-CM | POA: Diagnosis not present

## 2024-07-05 DIAGNOSIS — F32A Depression, unspecified: Secondary | ICD-10-CM | POA: Diagnosis not present

## 2024-07-05 DIAGNOSIS — D649 Anemia, unspecified: Secondary | ICD-10-CM | POA: Diagnosis not present

## 2024-07-05 DIAGNOSIS — E782 Mixed hyperlipidemia: Secondary | ICD-10-CM | POA: Diagnosis not present

## 2024-07-06 ENCOUNTER — Encounter: Payer: Self-pay | Admitting: Family Medicine

## 2024-07-06 DIAGNOSIS — G2581 Restless legs syndrome: Secondary | ICD-10-CM | POA: Diagnosis not present

## 2024-07-06 DIAGNOSIS — G250 Essential tremor: Secondary | ICD-10-CM | POA: Diagnosis not present

## 2024-07-06 DIAGNOSIS — F32A Depression, unspecified: Secondary | ICD-10-CM | POA: Diagnosis not present

## 2024-07-06 DIAGNOSIS — M80052D Age-related osteoporosis with current pathological fracture, left femur, subsequent encounter for fracture with routine healing: Secondary | ICD-10-CM | POA: Diagnosis not present

## 2024-07-06 DIAGNOSIS — E782 Mixed hyperlipidemia: Secondary | ICD-10-CM | POA: Diagnosis not present

## 2024-07-06 DIAGNOSIS — Z8542 Personal history of malignant neoplasm of other parts of uterus: Secondary | ICD-10-CM | POA: Diagnosis not present

## 2024-07-06 DIAGNOSIS — D649 Anemia, unspecified: Secondary | ICD-10-CM | POA: Diagnosis not present

## 2024-07-06 DIAGNOSIS — M80032D Age-related osteoporosis with current pathological fracture, left forearm, subsequent encounter for fracture with routine healing: Secondary | ICD-10-CM | POA: Diagnosis not present

## 2024-07-06 DIAGNOSIS — I10 Essential (primary) hypertension: Secondary | ICD-10-CM | POA: Diagnosis not present

## 2024-07-07 DIAGNOSIS — I10 Essential (primary) hypertension: Secondary | ICD-10-CM | POA: Diagnosis not present

## 2024-07-07 DIAGNOSIS — M80032D Age-related osteoporosis with current pathological fracture, left forearm, subsequent encounter for fracture with routine healing: Secondary | ICD-10-CM | POA: Diagnosis not present

## 2024-07-07 DIAGNOSIS — M80052D Age-related osteoporosis with current pathological fracture, left femur, subsequent encounter for fracture with routine healing: Secondary | ICD-10-CM | POA: Diagnosis not present

## 2024-07-07 DIAGNOSIS — D649 Anemia, unspecified: Secondary | ICD-10-CM | POA: Diagnosis not present

## 2024-07-07 DIAGNOSIS — E782 Mixed hyperlipidemia: Secondary | ICD-10-CM | POA: Diagnosis not present

## 2024-07-07 DIAGNOSIS — G2581 Restless legs syndrome: Secondary | ICD-10-CM | POA: Diagnosis not present

## 2024-07-07 DIAGNOSIS — Z8542 Personal history of malignant neoplasm of other parts of uterus: Secondary | ICD-10-CM | POA: Diagnosis not present

## 2024-07-07 DIAGNOSIS — F32A Depression, unspecified: Secondary | ICD-10-CM | POA: Diagnosis not present

## 2024-07-07 DIAGNOSIS — G250 Essential tremor: Secondary | ICD-10-CM | POA: Diagnosis not present

## 2024-07-08 DIAGNOSIS — Z90710 Acquired absence of both cervix and uterus: Secondary | ICD-10-CM

## 2024-07-08 DIAGNOSIS — E782 Mixed hyperlipidemia: Secondary | ICD-10-CM | POA: Diagnosis not present

## 2024-07-08 DIAGNOSIS — M80052D Age-related osteoporosis with current pathological fracture, left femur, subsequent encounter for fracture with routine healing: Secondary | ICD-10-CM | POA: Diagnosis not present

## 2024-07-08 DIAGNOSIS — F32A Depression, unspecified: Secondary | ICD-10-CM | POA: Diagnosis not present

## 2024-07-08 DIAGNOSIS — Z86718 Personal history of other venous thrombosis and embolism: Secondary | ICD-10-CM

## 2024-07-08 DIAGNOSIS — Z8542 Personal history of malignant neoplasm of other parts of uterus: Secondary | ICD-10-CM | POA: Diagnosis not present

## 2024-07-08 DIAGNOSIS — G250 Essential tremor: Secondary | ICD-10-CM | POA: Diagnosis not present

## 2024-07-08 DIAGNOSIS — G2581 Restless legs syndrome: Secondary | ICD-10-CM | POA: Diagnosis not present

## 2024-07-08 DIAGNOSIS — R32 Unspecified urinary incontinence: Secondary | ICD-10-CM

## 2024-07-08 DIAGNOSIS — Z9049 Acquired absence of other specified parts of digestive tract: Secondary | ICD-10-CM

## 2024-07-08 DIAGNOSIS — M80032D Age-related osteoporosis with current pathological fracture, left forearm, subsequent encounter for fracture with routine healing: Secondary | ICD-10-CM | POA: Diagnosis not present

## 2024-07-08 DIAGNOSIS — Z9181 History of falling: Secondary | ICD-10-CM

## 2024-07-08 DIAGNOSIS — D649 Anemia, unspecified: Secondary | ICD-10-CM | POA: Diagnosis not present

## 2024-07-08 DIAGNOSIS — I10 Essential (primary) hypertension: Secondary | ICD-10-CM | POA: Diagnosis not present

## 2024-07-08 DIAGNOSIS — Z8673 Personal history of transient ischemic attack (TIA), and cerebral infarction without residual deficits: Secondary | ICD-10-CM

## 2024-07-08 DIAGNOSIS — Z7901 Long term (current) use of anticoagulants: Secondary | ICD-10-CM

## 2024-07-08 DIAGNOSIS — Z86711 Personal history of pulmonary embolism: Secondary | ICD-10-CM

## 2024-07-08 DIAGNOSIS — Z9849 Cataract extraction status, unspecified eye: Secondary | ICD-10-CM

## 2024-07-09 DIAGNOSIS — I10 Essential (primary) hypertension: Secondary | ICD-10-CM | POA: Diagnosis not present

## 2024-07-09 DIAGNOSIS — D649 Anemia, unspecified: Secondary | ICD-10-CM | POA: Diagnosis not present

## 2024-07-09 DIAGNOSIS — F32A Depression, unspecified: Secondary | ICD-10-CM | POA: Diagnosis not present

## 2024-07-09 DIAGNOSIS — G250 Essential tremor: Secondary | ICD-10-CM | POA: Diagnosis not present

## 2024-07-09 DIAGNOSIS — M80052D Age-related osteoporosis with current pathological fracture, left femur, subsequent encounter for fracture with routine healing: Secondary | ICD-10-CM | POA: Diagnosis not present

## 2024-07-09 DIAGNOSIS — G2581 Restless legs syndrome: Secondary | ICD-10-CM | POA: Diagnosis not present

## 2024-07-09 DIAGNOSIS — E782 Mixed hyperlipidemia: Secondary | ICD-10-CM | POA: Diagnosis not present

## 2024-07-09 DIAGNOSIS — Z8542 Personal history of malignant neoplasm of other parts of uterus: Secondary | ICD-10-CM | POA: Diagnosis not present

## 2024-07-09 DIAGNOSIS — M80032D Age-related osteoporosis with current pathological fracture, left forearm, subsequent encounter for fracture with routine healing: Secondary | ICD-10-CM | POA: Diagnosis not present

## 2024-07-12 DIAGNOSIS — D649 Anemia, unspecified: Secondary | ICD-10-CM | POA: Diagnosis not present

## 2024-07-12 DIAGNOSIS — F32A Depression, unspecified: Secondary | ICD-10-CM | POA: Diagnosis not present

## 2024-07-12 DIAGNOSIS — Z8542 Personal history of malignant neoplasm of other parts of uterus: Secondary | ICD-10-CM | POA: Diagnosis not present

## 2024-07-12 DIAGNOSIS — M80032D Age-related osteoporosis with current pathological fracture, left forearm, subsequent encounter for fracture with routine healing: Secondary | ICD-10-CM | POA: Diagnosis not present

## 2024-07-12 DIAGNOSIS — G250 Essential tremor: Secondary | ICD-10-CM | POA: Diagnosis not present

## 2024-07-12 DIAGNOSIS — I10 Essential (primary) hypertension: Secondary | ICD-10-CM | POA: Diagnosis not present

## 2024-07-12 DIAGNOSIS — M80052D Age-related osteoporosis with current pathological fracture, left femur, subsequent encounter for fracture with routine healing: Secondary | ICD-10-CM | POA: Diagnosis not present

## 2024-07-12 DIAGNOSIS — G2581 Restless legs syndrome: Secondary | ICD-10-CM | POA: Diagnosis not present

## 2024-07-12 DIAGNOSIS — E782 Mixed hyperlipidemia: Secondary | ICD-10-CM | POA: Diagnosis not present

## 2024-07-12 NOTE — Telephone Encounter (Signed)
 Patient called stating she would like to do the prolia  injection since it is the most cost efficient. She stated she would like to do the pharmacy benefit route and would like this done this week. Is it okay for patient to have prolia  instead of evenity ? Please advise.

## 2024-07-12 NOTE — Addendum Note (Signed)
 Addended by: MARDY LEOTIS RAMAN on: 07/12/2024 10:18 AM   Modules accepted: Orders

## 2024-07-13 ENCOUNTER — Encounter (HOSPITAL_COMMUNITY): Payer: Self-pay

## 2024-07-13 ENCOUNTER — Other Ambulatory Visit: Payer: Self-pay

## 2024-07-13 ENCOUNTER — Other Ambulatory Visit (HOSPITAL_COMMUNITY): Payer: Self-pay

## 2024-07-13 DIAGNOSIS — M80052D Age-related osteoporosis with current pathological fracture, left femur, subsequent encounter for fracture with routine healing: Secondary | ICD-10-CM | POA: Diagnosis not present

## 2024-07-13 DIAGNOSIS — G2581 Restless legs syndrome: Secondary | ICD-10-CM | POA: Diagnosis not present

## 2024-07-13 DIAGNOSIS — F32A Depression, unspecified: Secondary | ICD-10-CM | POA: Diagnosis not present

## 2024-07-13 DIAGNOSIS — E782 Mixed hyperlipidemia: Secondary | ICD-10-CM | POA: Diagnosis not present

## 2024-07-13 DIAGNOSIS — Z8542 Personal history of malignant neoplasm of other parts of uterus: Secondary | ICD-10-CM | POA: Diagnosis not present

## 2024-07-13 DIAGNOSIS — I10 Essential (primary) hypertension: Secondary | ICD-10-CM | POA: Diagnosis not present

## 2024-07-13 DIAGNOSIS — M80032D Age-related osteoporosis with current pathological fracture, left forearm, subsequent encounter for fracture with routine healing: Secondary | ICD-10-CM | POA: Diagnosis not present

## 2024-07-13 DIAGNOSIS — G250 Essential tremor: Secondary | ICD-10-CM | POA: Diagnosis not present

## 2024-07-13 DIAGNOSIS — D649 Anemia, unspecified: Secondary | ICD-10-CM | POA: Diagnosis not present

## 2024-07-13 MED ORDER — DENOSUMAB 60 MG/ML ~~LOC~~ SOSY
60.0000 mg | PREFILLED_SYRINGE | Freq: Once | SUBCUTANEOUS | 0 refills | Status: DC
  Filled 2024-07-13 – 2024-07-14 (×2): qty 1, 180d supply, fill #0

## 2024-07-13 NOTE — Telephone Encounter (Signed)
 Called and spoke with patient. She is agreeable to having Prolia  filled through her pharmacy benefits with [facility]. She is aware to expect a call from the pharmacy and that the medication will be sent to our office. We will reach out to schedule NUR visit for injection once received in office.   She would like to have injection done this week if it arrives in time.   Rx sent to the pharmacy.   Forwarding to Arland to advise once medication has been received in office from pharmacy.

## 2024-07-13 NOTE — Progress Notes (Signed)
 Patient to be enrolled with [facility]. Routed to Tiffany.

## 2024-07-13 NOTE — Telephone Encounter (Signed)
 Noted

## 2024-07-13 NOTE — Addendum Note (Signed)
 Addended by: MARDY LEOTIS RAMAN on: 07/13/2024 09:31 AM   Modules accepted: Orders

## 2024-07-14 ENCOUNTER — Other Ambulatory Visit: Payer: Self-pay

## 2024-07-14 ENCOUNTER — Encounter (HOSPITAL_COMMUNITY): Payer: Self-pay

## 2024-07-14 ENCOUNTER — Other Ambulatory Visit (HOSPITAL_COMMUNITY): Payer: Self-pay

## 2024-07-14 ENCOUNTER — Telehealth: Payer: Self-pay | Admitting: *Deleted

## 2024-07-14 DIAGNOSIS — G250 Essential tremor: Secondary | ICD-10-CM | POA: Diagnosis not present

## 2024-07-14 DIAGNOSIS — Z8542 Personal history of malignant neoplasm of other parts of uterus: Secondary | ICD-10-CM | POA: Diagnosis not present

## 2024-07-14 DIAGNOSIS — E782 Mixed hyperlipidemia: Secondary | ICD-10-CM | POA: Diagnosis not present

## 2024-07-14 DIAGNOSIS — D649 Anemia, unspecified: Secondary | ICD-10-CM | POA: Diagnosis not present

## 2024-07-14 DIAGNOSIS — I10 Essential (primary) hypertension: Secondary | ICD-10-CM | POA: Diagnosis not present

## 2024-07-14 DIAGNOSIS — M80032D Age-related osteoporosis with current pathological fracture, left forearm, subsequent encounter for fracture with routine healing: Secondary | ICD-10-CM | POA: Diagnosis not present

## 2024-07-14 DIAGNOSIS — G2581 Restless legs syndrome: Secondary | ICD-10-CM | POA: Diagnosis not present

## 2024-07-14 DIAGNOSIS — F32A Depression, unspecified: Secondary | ICD-10-CM | POA: Diagnosis not present

## 2024-07-14 DIAGNOSIS — M80052D Age-related osteoporosis with current pathological fracture, left femur, subsequent encounter for fracture with routine healing: Secondary | ICD-10-CM | POA: Diagnosis not present

## 2024-07-14 NOTE — Telephone Encounter (Signed)
 Copied from CRM [id number]. Topic: Appointments - Scheduling Inquiry for Clinic >> Jul 14, 2024  2:38 PM Sophia H wrote: Reason for CRM: Patient states she is going to have her denosumab  (PROLIA ) 60 MG/ML SOSY injection delivered tomorrow and is wanting to know if she can stop by the clinic in the afternoon to get this administered. Please reach out # [phone]

## 2024-07-14 NOTE — Progress Notes (Signed)
 Pharmacy Patient Advocate Encounter  Insurance verification completed.   The patient is insured through [employer]   Ran test claim for Prolia. Co-pay is $64.  This test claim was processed through [facility]- copay amounts may vary at other pharmacies due to pharmacy/plan contracts, or as the patient moves through the different stages of their insurance plan.

## 2024-07-14 NOTE — Progress Notes (Signed)
 Specialty Pharmacy Initial Fill Coordination Note  Monica Curry is a 89 y.o. female contacted today regarding initial fill of specialty medication(s) Denosumab  (PROLIA )   Patient requested Courier to Provider Office   Delivery date: 07/15/24   Verified address: [facility] [phone]   Medication will be filled on 8/6.   Patient is aware of $64 copayment.

## 2024-07-15 ENCOUNTER — Other Ambulatory Visit: Payer: Self-pay

## 2024-07-15 ENCOUNTER — Ambulatory Visit: Admitting: *Deleted

## 2024-07-15 DIAGNOSIS — M8000XA Age-related osteoporosis with current pathological fracture, unspecified site, initial encounter for fracture: Secondary | ICD-10-CM

## 2024-07-15 MED ORDER — DENOSUMAB 60 MG/ML ~~LOC~~ SOSY
60.0000 mg | PREFILLED_SYRINGE | SUBCUTANEOUS | Status: AC
Start: 2024-07-15 — End: ?

## 2024-07-15 MED ORDER — DENOSUMAB 60 MG/ML ~~LOC~~ SOSY
60.0000 mg | PREFILLED_SYRINGE | Freq: Once | SUBCUTANEOUS | Status: AC
Start: 2024-07-15 — End: ?

## 2024-07-15 NOTE — Telephone Encounter (Signed)
 Prolia  received today from pharmacy and pt is being scheduled to come in for injection.

## 2024-07-15 NOTE — Progress Notes (Signed)
 Patient present for Prolia  INJ  Given on rt arm  Patient tolerated well  Advise to schedule a F/U Prolia  Inj in 6 month

## 2024-07-19 DIAGNOSIS — E782 Mixed hyperlipidemia: Secondary | ICD-10-CM | POA: Diagnosis not present

## 2024-07-19 DIAGNOSIS — Z8542 Personal history of malignant neoplasm of other parts of uterus: Secondary | ICD-10-CM | POA: Diagnosis not present

## 2024-07-19 DIAGNOSIS — G2581 Restless legs syndrome: Secondary | ICD-10-CM | POA: Diagnosis not present

## 2024-07-19 DIAGNOSIS — M80032D Age-related osteoporosis with current pathological fracture, left forearm, subsequent encounter for fracture with routine healing: Secondary | ICD-10-CM | POA: Diagnosis not present

## 2024-07-19 DIAGNOSIS — F32A Depression, unspecified: Secondary | ICD-10-CM | POA: Diagnosis not present

## 2024-07-19 DIAGNOSIS — I10 Essential (primary) hypertension: Secondary | ICD-10-CM | POA: Diagnosis not present

## 2024-07-19 DIAGNOSIS — G250 Essential tremor: Secondary | ICD-10-CM | POA: Diagnosis not present

## 2024-07-19 DIAGNOSIS — D649 Anemia, unspecified: Secondary | ICD-10-CM | POA: Diagnosis not present

## 2024-07-19 DIAGNOSIS — M80052D Age-related osteoporosis with current pathological fracture, left femur, subsequent encounter for fracture with routine healing: Secondary | ICD-10-CM | POA: Diagnosis not present

## 2024-07-21 DIAGNOSIS — Z8542 Personal history of malignant neoplasm of other parts of uterus: Secondary | ICD-10-CM | POA: Diagnosis not present

## 2024-07-21 DIAGNOSIS — G2581 Restless legs syndrome: Secondary | ICD-10-CM | POA: Diagnosis not present

## 2024-07-21 DIAGNOSIS — M80032D Age-related osteoporosis with current pathological fracture, left forearm, subsequent encounter for fracture with routine healing: Secondary | ICD-10-CM | POA: Diagnosis not present

## 2024-07-21 DIAGNOSIS — D649 Anemia, unspecified: Secondary | ICD-10-CM | POA: Diagnosis not present

## 2024-07-21 DIAGNOSIS — M80052D Age-related osteoporosis with current pathological fracture, left femur, subsequent encounter for fracture with routine healing: Secondary | ICD-10-CM | POA: Diagnosis not present

## 2024-07-21 DIAGNOSIS — G250 Essential tremor: Secondary | ICD-10-CM | POA: Diagnosis not present

## 2024-07-21 DIAGNOSIS — E782 Mixed hyperlipidemia: Secondary | ICD-10-CM | POA: Diagnosis not present

## 2024-07-21 DIAGNOSIS — F32A Depression, unspecified: Secondary | ICD-10-CM | POA: Diagnosis not present

## 2024-07-21 DIAGNOSIS — I10 Essential (primary) hypertension: Secondary | ICD-10-CM | POA: Diagnosis not present

## 2024-07-22 DIAGNOSIS — M80052D Age-related osteoporosis with current pathological fracture, left femur, subsequent encounter for fracture with routine healing: Secondary | ICD-10-CM | POA: Diagnosis not present

## 2024-07-22 DIAGNOSIS — M80032D Age-related osteoporosis with current pathological fracture, left forearm, subsequent encounter for fracture with routine healing: Secondary | ICD-10-CM | POA: Diagnosis not present

## 2024-07-22 DIAGNOSIS — Z8542 Personal history of malignant neoplasm of other parts of uterus: Secondary | ICD-10-CM | POA: Diagnosis not present

## 2024-07-22 DIAGNOSIS — E782 Mixed hyperlipidemia: Secondary | ICD-10-CM | POA: Diagnosis not present

## 2024-07-22 DIAGNOSIS — F32A Depression, unspecified: Secondary | ICD-10-CM | POA: Diagnosis not present

## 2024-07-22 DIAGNOSIS — I10 Essential (primary) hypertension: Secondary | ICD-10-CM | POA: Diagnosis not present

## 2024-07-22 DIAGNOSIS — G2581 Restless legs syndrome: Secondary | ICD-10-CM | POA: Diagnosis not present

## 2024-07-22 DIAGNOSIS — G250 Essential tremor: Secondary | ICD-10-CM | POA: Diagnosis not present

## 2024-07-22 DIAGNOSIS — D649 Anemia, unspecified: Secondary | ICD-10-CM | POA: Diagnosis not present

## 2024-07-26 DIAGNOSIS — M80052D Age-related osteoporosis with current pathological fracture, left femur, subsequent encounter for fracture with routine healing: Secondary | ICD-10-CM | POA: Diagnosis not present

## 2024-07-26 DIAGNOSIS — E782 Mixed hyperlipidemia: Secondary | ICD-10-CM | POA: Diagnosis not present

## 2024-07-26 DIAGNOSIS — G250 Essential tremor: Secondary | ICD-10-CM | POA: Diagnosis not present

## 2024-07-26 DIAGNOSIS — I10 Essential (primary) hypertension: Secondary | ICD-10-CM | POA: Diagnosis not present

## 2024-07-26 DIAGNOSIS — Z8542 Personal history of malignant neoplasm of other parts of uterus: Secondary | ICD-10-CM | POA: Diagnosis not present

## 2024-07-26 DIAGNOSIS — F32A Depression, unspecified: Secondary | ICD-10-CM | POA: Diagnosis not present

## 2024-07-26 DIAGNOSIS — M80032D Age-related osteoporosis with current pathological fracture, left forearm, subsequent encounter for fracture with routine healing: Secondary | ICD-10-CM | POA: Diagnosis not present

## 2024-07-26 DIAGNOSIS — G2581 Restless legs syndrome: Secondary | ICD-10-CM | POA: Diagnosis not present

## 2024-07-26 DIAGNOSIS — D649 Anemia, unspecified: Secondary | ICD-10-CM | POA: Diagnosis not present

## 2024-07-28 DIAGNOSIS — M80032D Age-related osteoporosis with current pathological fracture, left forearm, subsequent encounter for fracture with routine healing: Secondary | ICD-10-CM | POA: Diagnosis not present

## 2024-07-28 DIAGNOSIS — M80052D Age-related osteoporosis with current pathological fracture, left femur, subsequent encounter for fracture with routine healing: Secondary | ICD-10-CM | POA: Diagnosis not present

## 2024-07-28 DIAGNOSIS — F32A Depression, unspecified: Secondary | ICD-10-CM | POA: Diagnosis not present

## 2024-07-28 DIAGNOSIS — D649 Anemia, unspecified: Secondary | ICD-10-CM | POA: Diagnosis not present

## 2024-07-28 DIAGNOSIS — G2581 Restless legs syndrome: Secondary | ICD-10-CM | POA: Diagnosis not present

## 2024-07-28 DIAGNOSIS — G250 Essential tremor: Secondary | ICD-10-CM | POA: Diagnosis not present

## 2024-07-28 DIAGNOSIS — E782 Mixed hyperlipidemia: Secondary | ICD-10-CM | POA: Diagnosis not present

## 2024-07-28 DIAGNOSIS — Z8542 Personal history of malignant neoplasm of other parts of uterus: Secondary | ICD-10-CM | POA: Diagnosis not present

## 2024-07-28 DIAGNOSIS — I10 Essential (primary) hypertension: Secondary | ICD-10-CM | POA: Diagnosis not present

## 2024-08-02 DIAGNOSIS — E782 Mixed hyperlipidemia: Secondary | ICD-10-CM | POA: Diagnosis not present

## 2024-08-02 DIAGNOSIS — M80032D Age-related osteoporosis with current pathological fracture, left forearm, subsequent encounter for fracture with routine healing: Secondary | ICD-10-CM | POA: Diagnosis not present

## 2024-08-02 DIAGNOSIS — G250 Essential tremor: Secondary | ICD-10-CM | POA: Diagnosis not present

## 2024-08-02 DIAGNOSIS — D649 Anemia, unspecified: Secondary | ICD-10-CM | POA: Diagnosis not present

## 2024-08-02 DIAGNOSIS — I10 Essential (primary) hypertension: Secondary | ICD-10-CM | POA: Diagnosis not present

## 2024-08-02 DIAGNOSIS — M80052D Age-related osteoporosis with current pathological fracture, left femur, subsequent encounter for fracture with routine healing: Secondary | ICD-10-CM | POA: Diagnosis not present

## 2024-08-02 DIAGNOSIS — F32A Depression, unspecified: Secondary | ICD-10-CM | POA: Diagnosis not present

## 2024-08-02 DIAGNOSIS — G2581 Restless legs syndrome: Secondary | ICD-10-CM | POA: Diagnosis not present

## 2024-08-02 DIAGNOSIS — Z8542 Personal history of malignant neoplasm of other parts of uterus: Secondary | ICD-10-CM | POA: Diagnosis not present

## 2024-08-04 DIAGNOSIS — M80032D Age-related osteoporosis with current pathological fracture, left forearm, subsequent encounter for fracture with routine healing: Secondary | ICD-10-CM | POA: Diagnosis not present

## 2024-08-04 DIAGNOSIS — D649 Anemia, unspecified: Secondary | ICD-10-CM | POA: Diagnosis not present

## 2024-08-04 DIAGNOSIS — E782 Mixed hyperlipidemia: Secondary | ICD-10-CM | POA: Diagnosis not present

## 2024-08-04 DIAGNOSIS — Z8542 Personal history of malignant neoplasm of other parts of uterus: Secondary | ICD-10-CM | POA: Diagnosis not present

## 2024-08-04 DIAGNOSIS — G2581 Restless legs syndrome: Secondary | ICD-10-CM | POA: Diagnosis not present

## 2024-08-04 DIAGNOSIS — I10 Essential (primary) hypertension: Secondary | ICD-10-CM | POA: Diagnosis not present

## 2024-08-04 DIAGNOSIS — F32A Depression, unspecified: Secondary | ICD-10-CM | POA: Diagnosis not present

## 2024-08-04 DIAGNOSIS — M80052D Age-related osteoporosis with current pathological fracture, left femur, subsequent encounter for fracture with routine healing: Secondary | ICD-10-CM | POA: Diagnosis not present

## 2024-08-04 DIAGNOSIS — G250 Essential tremor: Secondary | ICD-10-CM | POA: Diagnosis not present

## 2024-08-10 DIAGNOSIS — S72142D Displaced intertrochanteric fracture of left femur, subsequent encounter for closed fracture with routine healing: Secondary | ICD-10-CM | POA: Diagnosis not present

## 2024-08-10 DIAGNOSIS — S52532D Colles' fracture of left radius, subsequent encounter for closed fracture with routine healing: Secondary | ICD-10-CM | POA: Diagnosis not present

## 2024-08-11 DIAGNOSIS — G2581 Restless legs syndrome: Secondary | ICD-10-CM | POA: Diagnosis not present

## 2024-08-11 DIAGNOSIS — Z8542 Personal history of malignant neoplasm of other parts of uterus: Secondary | ICD-10-CM | POA: Diagnosis not present

## 2024-08-11 DIAGNOSIS — G250 Essential tremor: Secondary | ICD-10-CM | POA: Diagnosis not present

## 2024-08-11 DIAGNOSIS — M80052D Age-related osteoporosis with current pathological fracture, left femur, subsequent encounter for fracture with routine healing: Secondary | ICD-10-CM | POA: Diagnosis not present

## 2024-08-11 DIAGNOSIS — F32A Depression, unspecified: Secondary | ICD-10-CM | POA: Diagnosis not present

## 2024-08-11 DIAGNOSIS — D649 Anemia, unspecified: Secondary | ICD-10-CM | POA: Diagnosis not present

## 2024-08-11 DIAGNOSIS — E782 Mixed hyperlipidemia: Secondary | ICD-10-CM | POA: Diagnosis not present

## 2024-08-11 DIAGNOSIS — I10 Essential (primary) hypertension: Secondary | ICD-10-CM | POA: Diagnosis not present

## 2024-08-11 DIAGNOSIS — M80032D Age-related osteoporosis with current pathological fracture, left forearm, subsequent encounter for fracture with routine healing: Secondary | ICD-10-CM | POA: Diagnosis not present

## 2024-08-30 ENCOUNTER — Ambulatory Visit: Attending: Student | Admitting: Physical Therapy

## 2024-08-30 ENCOUNTER — Other Ambulatory Visit: Payer: Self-pay

## 2024-08-30 ENCOUNTER — Encounter: Payer: Self-pay | Admitting: Physical Therapy

## 2024-08-30 ENCOUNTER — Ambulatory Visit: Admitting: Occupational Therapy

## 2024-08-30 DIAGNOSIS — R2681 Unsteadiness on feet: Secondary | ICD-10-CM | POA: Diagnosis not present

## 2024-08-30 DIAGNOSIS — R278 Other lack of coordination: Secondary | ICD-10-CM

## 2024-08-30 DIAGNOSIS — R208 Other disturbances of skin sensation: Secondary | ICD-10-CM | POA: Insufficient documentation

## 2024-08-30 DIAGNOSIS — M6281 Muscle weakness (generalized): Secondary | ICD-10-CM

## 2024-08-30 DIAGNOSIS — M25632 Stiffness of left wrist, not elsewhere classified: Secondary | ICD-10-CM | POA: Insufficient documentation

## 2024-08-30 DIAGNOSIS — R2689 Other abnormalities of gait and mobility: Secondary | ICD-10-CM | POA: Insufficient documentation

## 2024-08-30 NOTE — Therapy (Signed)
 OUTPATIENT PHYSICAL THERAPY NEURO EVALUATION   Patient Name: Monica Curry MRN: [id number] DOB:01/22/1931, 89 y.o., female Today's Date: 08/30/2024   PCP: Wendolyn Jenkins Jansky, MD REFERRING PROVIDER: Danton Lauraine LABOR, PA-C   END OF SESSION:  PT End of Session - 08/30/24 1131     Visit Number 1    Number of Visits 17    Date for Recertification  10/29/24    Authorization Type [employer]-auth submitted    PT Start Time 0934    PT Stop Time 1015    PT Time Calculation (min) 41 min    Equipment Utilized During Treatment Gait belt    Activity Tolerance Patient tolerated treatment well    Behavior During Therapy WFL for tasks assessed/performed          Past Medical History:  Diagnosis Date   Allergy    Medications and pollen   Anemia    Anxiety    Always   C. difficile colitis    Cancer (HCC)    Endometrial cancer   Cataract    Had surgery 2021   Chronic kidney disease 2018   2 siblings had kidney failure, sister had transplant   Clotting disorder (HCC)    Essential and other specified forms of tremor    Hyperlipidemia    Hypertension    Osteoporosis 11/2019   T score -2.8 stable from prior DEXA   Personal history of venous thrombosis and embolism 1997   Pulmonary embolism (HCC) 06/14/2021   Pulmonary embolus (HCC)    Shingles    Stroke (HCC) 2018   TIA   Thrush    TIA (transient ischemic attack)    carotid US  07/2022: no ICA stenosis bilaterally   Past Surgical History:  Procedure Laterality Date   ABDOMINAL HYSTERECTOMY  1997   APPENDECTOMY  1961   cataract surg Bilateral 2021   Dr. Roz, R eye 8/18, L 8/25   CESAREAN SECTION  1961   EYE SURGERY  2021   Cataract both eyes   FRACTURE SURGERY  2025   Broken hip   INTRAMEDULLARY (IM) NAIL INTERTROCHANTERIC Left 04/28/2024   Procedure: FIXATION, FRACTURE, INTERTROCHANTERIC, WITH INTRAMEDULLARY ROD;  Surgeon: Kendal Franky SQUIBB, MD;  Location: [facility] OR;  Service: Orthopedics;  Laterality: Left;    TONSILLECTOMY AND ADENOIDECTOMY  1944   TOTAL ABDOMINAL HYSTERECTOMY W/ BILATERAL SALPINGOOPHORECTOMY  1997   Patient Active Problem List   Diagnosis Date Noted   RLS (restless legs syndrome) 06/15/2024   Fracture, Colles, left, closed 04/28/2024   Hip fracture (HCC) 04/27/2024   GAD (generalized anxiety disorder) 04/01/2024   Thyroid  nodule 01/10/2022   Anxiety 08/10/2021   Aortic atherosclerosis (HCC) - CT 06/2021 06/15/2021   Bilateral renal cysts 07/06/2020   Paroxysmal supraventricular tachycardia (HCC) 10/20/2019   History of skin cancer (basal cell) 06/16/2019   Complete right bundle branch block 06/16/2019   B12 deficiency 06/15/2019   History of TIA (transient ischemic attack) 10/23/2017   Chronic kidney disease, stage 3b (HCC) 07/21/2015   Medication management 07/04/2014   Essential hypertension 11/25/2013   Hyperlipidemia, mixed 11/25/2013   Abnormal glucose 11/25/2013   Vitamin D  deficiency 11/25/2013   Essential tremor 11/25/2013   History of endometrial cancer    Hx/o DVT/PE    Urethral prolapse    Osteoporosis 10/10/2011    ONSET DATE: 04/28/2024  REFERRING DIAG: S/P L intertrachanteric femur fracture, Left distal radius fracture 04/28/2024   THERAPY DIAG:  Unsteadiness on feet  Other abnormalities of gait and  mobility  Muscle weakness (generalized)  Rationale for Evaluation and Treatment: Rehabilitation  SUBJECTIVE:                                                                                                                                                                                             SUBJECTIVE STATEMENT: Was doing some projects in my yard; I made a misstep and fell on 04/28/24.  I broke my left hip and left wrist.  Did the reduction/cast on L wrist and hand the surgery L hip the following day.  Went to [facility] for rehab and came home early July.  Had HHPT and HHOT for 60 days in the home as well.   Pt accompanied by: self  PERTINENT  HISTORY: osteoporosis, hx of DVT; see additional PMH above  PAIN:  Are you having pain? No  PRECAUTIONS: Fall, wears L compression stocking due to L LE edema (was negative for DVT)  RED FLAGS: None   WEIGHT BEARING RESTRICTIONS: No  FALLS: Has patient fallen in last 6 months? Yes. Number of falls 1  LIVING ENVIRONMENT: Lives with: lives alone Lives in: House/apartment Stairs: 3 steps to enter home;  Has following equipment at home: Single point cane and Walker - 2 wheeled *cane has tripod base PLOF: Independent  PATIENT GOALS: To be able to transition to the cane, so I can get in and out of home better.  OBJECTIVE:  Note: Objective measures were completed at Evaluation unless otherwise noted.  DIAGNOSTIC FINDINGS: Interval proximal left femoral cephalomedullary nail fixation of the previously seen intertrochanteric fracture. Improved alignment.  COGNITION: Overall cognitive status: Within functional limits for tasks assessed   SENSATION: Reports decreased sensation in L fingers (to see hand surgeon)  EDEMA:  Noted LLE; wearing compression stocking  POSTURE: rounded shoulders and increased thoracic kyphosis  LOWER EXTREMITY ROM:     Active  Right Eval Left Eval  Hip flexion    Hip extension    Hip abduction    Hip adduction    Hip internal rotation    Hip external rotation    Knee flexion    Knee extension    Ankle dorsiflexion  10  Ankle plantarflexion  40  Ankle inversion    Ankle eversion     (Blank rows = not tested)  LOWER EXTREMITY MMT:    MMT Right Eval Left Eval  Hip flexion 4 3+  Hip extension    Hip abduction 5 4  Hip adduction 5 4  Hip internal rotation    Hip external rotation    Knee flexion 4+ 4+  Knee extension 4+ 4  Ankle dorsiflexion 4+ 4  Ankle plantarflexion    Ankle inversion    Ankle eversion    (Blank rows = not tested)  TRANSFERS: Sit to stand: Modified independence  Assistive device utilized: None     Stand to  sit: Modified independence  Assistive device utilized: hands at chair      STAIRS: Findings: Level of Assistance: Modified independence, Stair Negotiation Technique: Step to Pattern with Single Rail on Right, Number of Stairs: 2-3, Height of Stairs: 4-6   , and Comments: prior to fracture, pt negotiated steps alternating pattern GAIT: Findings: Gait Characteristics: step through pattern, decreased step length- Right, and decreased stance time- Left, Distance walked: 50 ft x 2, Assistive device utilized:Walker - 2 wheeled and SPC with small quad tip base, Level of assistance: Modified independence, CGA, and Min A, and Comments: Mod I with RW, CGA/min assist with cane  FUNCTIONAL TESTS:  10 meter walk test: 16.78 sec RW (1.95 ft/sec); 33.62 sec with cane (0.98 ft/sec) Lars:  34/56 (Scores <45/56 indicate increased fall risk)   [facility] PT Assessment - 08/30/24 0958       Standardized Balance Assessment   Standardized Balance Assessment Berg Balance Test      Berg Balance Test   Sit to Stand Able to stand without using hands and stabilize independently    Standing Unsupported Able to stand 2 minutes with supervision    Sitting with Back Unsupported but Feet Supported on Floor or Stool Able to sit safely and securely 2 minutes    Stand to Sit Controls descent by using hands    Transfers Able to transfer safely, definite need of hands    Standing Unsupported with Eyes Closed Able to stand 10 seconds with supervision    Standing Unsupported with Feet Together Needs help to attain position but able to stand for 30 seconds with feet together    From Standing, Reach Forward with Outstretched Arm Can reach forward >12 cm safely (5)    From Standing Position, Pick up Object from Floor Able to pick up shoe, needs supervision    From Standing Position, Turn to Look Behind Over each Shoulder Turn sideways only but maintains balance    Turn 360 Degrees Needs close supervision or verbal cueing    Standing  Unsupported, Alternately Place Feet on Step/Stool Needs assistance to keep from falling or unable to try    Standing Unsupported, One Foot in Front Able to take small step independently and hold 30 seconds    Standing on One Leg Able to lift leg independently and hold equal to or more than 3 seconds   4.47 LLE   Total Score 34                                                                                                                                    TREATMENT DATE: 08/30/2024    PATIENT EDUCATION: Education details: Animal nutritionist  results, POC, continue HEP from HHPT; verbally added lateral weightshifting to LLE, with attention to light UE support Person educated: Patient Education method: Explanation, Demonstration, and Verbal cues Education comprehension: verbalized understanding, returned demonstration, and needs further education  HOME EXERCISE PROGRAM: Verbally initiated lateral weightshifting; pt doing standing hip kicks, heel/toe raises, marching and sit to stand at home  GOALS: Goals reviewed with patient? Yes  SHORT TERM GOALS: Target date: 10/01/2024  Pt will be independent with HEP for improved gait, stair negotiation, balance. Baseline: Goal status: INITIAL  2.  Pt will improve Berg score to at least 45/56 to decrease fall risk. Baseline: 34/56 Goal status: INITIAL  3.  Pt will negotiate curb and 3 steps with cane, mod I, for improved household and community gait. Baseline:  Goal status: INITIAL   LONG TERM GOALS: Target date: 10/29/2024  Pt will be independent with HEP for improved balance, strength, gait, stairs. Baseline:  Goal status: INITIAL  2.  Pt will improve DGI score to at least 20/24 to decrease fall risk. Baseline: TBD Goal status: INITIAL  3.  Pt will improve gait velocity to at least 2.6 ft/sec with cane for improved gait efficiency and safety. Baseline: 0.98 ft/sec Goal status: INITIAL  4.  Pt will ambulate at least 500 ft, indoor and  outdoor gait, mod I with cane for improved community mobility. Baseline:  Goal status: INITIAL   ASSESSMENT:  CLINICAL IMPRESSION: Patient is a 89 y.o. female who was seen today for physical therapy evaluation and treatment for s/p L intertrochanteric femur fracture and L distal radius fracture from a fall outside. She had surgery to correct on 04/28/2024 and had therapy at SNF, HHPT prior to coming to [facility].  She was previously independent prior to the hip fracture.  She lives alone and is ambulating with RW.  She presents today with decreased strength, decreased balance, decreased independence with gait and stairs.  Currently, she has to have someone help her out of her home with RW and steps.  She is at increased fall risk per Berg score of 34/56 and gait velocity (trial with cane) of 0.98 ft/sec.  She would benefit from skilled PT to address the above stated deficits to further assist pt to return to independent PLOF and to decrease risk of falls.  OBJECTIVE IMPAIRMENTS: Abnormal gait, decreased balance, decreased mobility, difficulty walking, and decreased strength.   ACTIVITY LIMITATIONS: standing, stairs, transfers, and locomotion level  PARTICIPATION LIMITATIONS: meal prep, cleaning, driving, shopping, community activity, yard work, and church  PERSONAL FACTORS: 3+ comorbidities: see PMH above are also affecting patient's functional outcome.   REHAB POTENTIAL: Good  CLINICAL DECISION MAKING: Evolving/moderate complexity  EVALUATION COMPLEXITY: Moderate  PLAN:  PT FREQUENCY: 2x/week  PT DURATION: 8 weeks plus eval visit  PLANNED INTERVENTIONS: 97750- Physical Performance Testing, 97110-Therapeutic exercises, 97530- Therapeutic activity, 97112- Neuromuscular re-education, 97535- Self Care, [id number]- Manual therapy, [phone]- Gait training, Patient/Family education, Balance training, Stair training, and DME instructions  PLAN FOR NEXT SESSION: Initiate HEP (to address LLE strength, SLS,  balance); gait training with cane; stair negotiation with cane   Milee Qualls W., PT 08/30/2024, 11:55 AM  [facility] [address] [place], [place] [address] Phone # [phone] Fax # [phone]  Referring diagnosis? S/P L intertrachanteric femur fracture, Left distal radius fracture 04/28/2024  Treatment diagnosis? (if different than referring diagnosis) R 26.81, R 26.89, M62.81 What was this (referring dx) caused by? []  Surgery [x]  Fall []   Ongoing issue []  Arthritis []  Other: ____________  Laterality: []  Rt [x]  Lt []  Both  Check all possible CPT codes:  *CHOOSE 10 OR LESS*    See Planned Interventions listed in the Plan section of the Evaluation.

## 2024-08-30 NOTE — Therapy (Addendum)
 OUTPATIENT OCCUPATIONAL THERAPY ORTHO EVALUATION  Patient Name: Monica Curry MRN: [id number] DOB:[birth year], 89 y.o., female Today's Date: 08/30/2024  PCP: Wendolyn Jenkins Jansky, MD REFERRING PROVIDER: Danton Lauraine LABOR, PA-C  END OF SESSION:  OT End of Session - 08/30/24 1207     Visit Number 1    Number of Visits 11    Date for Recertification  10/15/24    Authorization Type [employer]    OT Start Time 1022    OT Stop Time 1104    OT Time Calculation (min) 42 min          Past Medical History:  Diagnosis Date   Allergy    Medications and pollen   Anemia    Anxiety    Always   C. difficile colitis    Cancer (HCC)    Endometrial cancer   Cataract    Had surgery 2021   Chronic kidney disease 2018   2 siblings had kidney failure, sister had transplant   Clotting disorder (HCC)    Essential and other specified forms of tremor    Hyperlipidemia    Hypertension    Osteoporosis 11/2019   T score -2.8 stable from prior DEXA   Personal history of venous thrombosis and embolism 1997   Pulmonary embolism (HCC) 06/14/2021   Pulmonary embolus (HCC)    Shingles    Stroke (HCC) 2018   TIA   Thrush    TIA (transient ischemic attack)    carotid US  07/2022: no ICA stenosis bilaterally   Past Surgical History:  Procedure Laterality Date   ABDOMINAL HYSTERECTOMY  1997   APPENDECTOMY  1961   cataract surg Bilateral 2021   Dr. Roz, R eye 8/18, L 8/25   CESAREAN SECTION  1961   EYE SURGERY  2021   Cataract both eyes   FRACTURE SURGERY  2025   Broken hip   INTRAMEDULLARY (IM) NAIL INTERTROCHANTERIC Left 04/28/2024   Procedure: FIXATION, FRACTURE, INTERTROCHANTERIC, WITH INTRAMEDULLARY ROD;  Surgeon: Kendal Franky SQUIBB, MD;  Location: [facility] OR;  Service: Orthopedics;  Laterality: Left;   TONSILLECTOMY AND ADENOIDECTOMY  1944   TOTAL ABDOMINAL HYSTERECTOMY W/ BILATERAL SALPINGOOPHORECTOMY  1997   Patient Active Problem List   Diagnosis Date Noted   RLS (restless legs  syndrome) 06/15/2024   Fracture, Colles, left, closed 04/28/2024   Hip fracture (HCC) 04/27/2024   GAD (generalized anxiety disorder) 04/01/2024   Thyroid  nodule 01/10/2022   Anxiety 08/10/2021   Aortic atherosclerosis (HCC) - CT 06/2021 06/15/2021   Bilateral renal cysts 07/06/2020   Paroxysmal supraventricular tachycardia (HCC) 10/20/2019   History of skin cancer (basal cell) 06/16/2019   Complete right bundle branch block 06/16/2019   B12 deficiency 06/15/2019   History of TIA (transient ischemic attack) 10/23/2017   Chronic kidney disease, stage 3b (HCC) 07/21/2015   Medication management 07/04/2014   Essential hypertension 11/25/2013   Hyperlipidemia, mixed 11/25/2013   Abnormal glucose 11/25/2013   Vitamin D  deficiency 11/25/2013   Essential tremor 11/25/2013   History of endometrial cancer    Hx/o DVT/PE    Urethral prolapse    Osteoporosis 10/10/2011    ONSET DATE: referral date 08/10/24 (injury 04/27/24)  REFERRING DIAG: S/P L intertrachanteric femur fracture, Left distal radius fracture 04/28/2024  THERAPY DIAG:  Stiffness of left wrist, not elsewhere classified  Muscle weakness (generalized)  Other disturbances of skin sensation  Other lack of coordination  Rationale for Evaluation and Treatment: Rehabilitation  SUBJECTIVE:   SUBJECTIVE STATEMENT: Pt reports having  a fall 04/27/24 that resulted in L wrist and L hip fracture.  Pt stating that they reset her wrist in the ER, had fit her for a splint and to f/u as needed with the hand surgeon.  Pt reports doing basic wrist and hand exercises with home health, still with swelling in her wrist and numbness in her thumb, index, and long finger.  Pt has been using 1# dowel and squishy things for strengthening.  Pt reports decreased balance impacting ability to carry items, therefore sits in transport w/c to vacuum and take grocery items to the kitchen. Pt accompanied by: self (dropped off)  PERTINENT HISTORY:   Per  Dr. Erwin consult 04/28/24: 89 y.o. female who presents after mechanical fall with notable pain to the left hip and wrist.  She was seen in the emergency department setting overnight, underwent clinical and radiographic workup which showed a left hip intertrochanteric femur fracture as well as an associated left distal radius fracture.  She underwent bedside hematoma block and closed reduction of the left wrist overnight with improved length and alignment of the distal radius, she was subsequently placed into a sugar-tong splint by the emergency department.  Today, she is comfortable at rest, denies any significant numbness or tingling in the left hand, splint remains in place, pain controlled at rest. Patient is to undergo left hip fracture fixation by the trauma service during this admission.  Based on her age and comorbidities, her left distal radius fracture is extra-articular in nature and minimally displaced, we can proceed with nonoperative care of this injury.  I did explain to the patient the ongoing process of fracture healing as well as the possibility for repeat displacement or shortening with time that may warrant surgical intervention.   Hand surgery will be available moving forward as needed.  Discussed with treating trauma surgeon for the hip fracture.  PRECAUTIONS: None  RED FLAGS: None   WEIGHT BEARING RESTRICTIONS: No  PAIN:  Are you having pain? No  FALLS: Has patient fallen in last 6 months? Yes. Number of falls 1 that resulted in hip and wrist fracture  LIVING ENVIRONMENT: Lives with: lives alone Lives in: House/apartment Stairs: 3 steps to enter, flight of steps to the basement but she does not go down them Has following equipment at home: [name] - 2 wheeled, shower chair, and Grab bars  PLOF: Independent with basic ADLs and Requires assistive device for independence; step-daughter will do laundry as pt's washer/dryer are in the basement  PATIENT GOALS: to be able to use  Left hand again  NEXT MD VISIT: 09/01/24  OBJECTIVE:  Note: Objective measures were completed at Evaluation unless otherwise noted.  HAND DOMINANCE: Right  ADLs: WFL; difficulty with donning compression sock on L leg  FUNCTIONAL OUTCOME MEASURES: Quick Dash: 50%   UPPER EXTREMITY ROM:     Active ROM Right eval Left eval  Shoulder flexion    Shoulder abduction    Shoulder adduction    Shoulder extension    Shoulder internal rotation    Shoulder external rotation    Elbow flexion    Elbow extension    Wrist flexion 75 30  Wrist extension 60 33  Wrist ulnar deviation 30 18  Wrist radial deviation 12 5  Wrist pronation WFL 60%  Wrist supination WFL WFL  (Blank rows = not tested)  Loose grasp on L, index and ring finger do not touch palm of hand, decreased hook fist  UPPER EXTREMITY MMT:  MMT Right eval Left eval  Shoulder flexion    Shoulder abduction    Shoulder adduction    Shoulder extension    Shoulder internal rotation    Shoulder external rotation    Middle trapezius    Lower trapezius    Elbow flexion    Elbow extension    Wrist flexion    Wrist extension    Wrist ulnar deviation    Wrist radial deviation    Wrist pronation    Wrist supination    (Blank rows = not tested)  HAND FUNCTION: Grip strength: Right: 40 lbs; Left: 5 lbs  COORDINATION: Mild B tremors  SENSATION: Numbness and tingling in R hand and digits along median nerve distribution  EDEMA: 26 cm R wrist circumference; 27.75 cm L wrist circumference  COGNITION: Overall cognitive status: Within functional limits for tasks assessed  OBSERVATIONS: Pt L wrist at ulnar head and dorsal wrist enlarged compared to R wrist and hand.  Pt demonstrating ~50% movement in all directions when compared to R, but verbalizing good understanding of exercises.   TREATMENT DATE:  08/30/24 Issued HEP with focus on tendon glides, thumb opposition, wrist flexion/extension with stretch, and  forearm supination with stretch.  Pt familiar with majority of exercises, however benefiting from cues and demonstration for table top position during tendon glides and wrist flexion/extension and supination with additional stretch in neutral position on table top.                                                                                                                  PATIENT EDUCATION: Education details: Educated on role and purpose of OT as well as potential interventions and goals for therapy based on initial evaluation findings. Person educated: Patient Education method: Explanation, Demonstration, and Handouts Education comprehension: verbalized understanding and needs further education  HOME EXERCISE PROGRAM: Access Code: [email or web address] URL: [email or web address] Date: 08/30/2024 Prepared by: [facility]  Exercises - Seated Digit Tendon Gliding  - 4 x daily - 20 reps - Thumb AROM Opposition  - 4 x daily - 20 reps - Seated Thumb Composite Flexion AROM  - 4 x daily - 20 reps - 5 sec hold - Wrist Flexion Extension AROM with Fingers Curled and Palm Down  - 4 x daily - 20 reps - 3-5 sec hold - Wrist AROM Radial Ulnar Deviation  - 4 x daily - 20 reps - Forearm Supination PROM  - 4 x daily - 20 reps - 10 sec hold - Seated Wrist Flexion Extension PROM  - 2-3 x daily - 20 reps  GOALS: Goals reviewed with patient? Yes  SHORT TERM GOALS: Target date: 09/24/24  Pt will be independent with HEP for ROM and strengthening. Baseline: new to OPOT Goal status: INITIAL  2.  Pt will verbalize understanding of task modifications and/or potential A/E needs to increase ease, safety, and independence w/ ADLs. Baseline: difficulty with donning compression socks d/t decreased grip and mobility of L wrist/hand  Goal status: INITIAL  3.  Pt will complete 9 hole peg test with LUE. Baseline: TBD Goal status: INITIAL    LONG TERM GOALS: Target  date: 10/15/24  Pt will demonstrate improved coordination of LUE as evidenced by completing 9 hole peg test in 35 seconds or less as needed for [facility] tasks with ADLs/IADLS. Baseline: TBD Goal status: INITIAL  2.  Pt will improve grip strength in left hand to be within at least 20 pounds of her right hand for functional use at home and in IADLs.  Baseline: L: 5# and R: 40# Goal status: INITIAL  3.  Pt will demonstrate improved L wrist flexion/extension to 80% of R wrist flexion/extension for improved functional use.  Baseline: R:75/60 and L: 30/33 Goal status: INITIAL  4.  Pt will demonstrate improved L forearm supination to 80% of R forearm supination for improved functional use. Baseline: 60% Goal status: INITIAL  5.  Pt will demonstrate improvements in pain and functional use of dominant RUE as evidenced by decreased score on QuickDASH from 50% impairement to < 30 % impairment.  Baseline: 50% Goal status: INITIAL   ASSESSMENT:  CLINICAL IMPRESSION: Patient is a 89 y.o. female who was seen today for occupational therapy evaluation for L distal radius fracture. Pt currently lives alone in a single story home with 3 steps to enter and a basement but is not going up/down into the basement.  Pt has family that will assist with laundry tasks as her washer and dryer are in the basement. Pt will benefit from skilled occupational therapy services to address strength and coordination, ROM, pain management, altered sensation, introduction of compensatory strategies/AE prn, and implementation of an HEP to improve participation and safety during ADLs and IADLs.    PERFORMANCE DEFICITS: in functional skills including ADLs, IADLs, coordination, sensation, edema, ROM, strength, flexibility, Fine motor control, Gross motor control, body mechanics, endurance, decreased knowledge of precautions, and UE functional use and psychosocial skills including routines and behaviors.   IMPAIRMENTS: are limiting  patient from ADLs, IADLs, rest and sleep, and social participation.   COMORBIDITIES: may have co-morbidities  that affects occupational performance. Patient will benefit from skilled OT to address above impairments and improve overall function.  MODIFICATION OR ASSISTANCE TO COMPLETE EVALUATION: No modification of tasks or assist necessary to complete an evaluation.  OT OCCUPATIONAL PROFILE AND HISTORY: Detailed assessment: Review of records and additional review of physical, cognitive, psychosocial history related to current functional performance.  CLINICAL DECISION MAKING: LOW - limited treatment options, no task modification necessary  REHAB POTENTIAL: Good  EVALUATION COMPLEXITY: Low      PLAN:  OT FREQUENCY: 1-2x/week  OT DURATION: 6 weeks  PLANNED INTERVENTIONS: 97168 OT Re-evaluation, 97535 self care/ADL training, 02889 therapeutic exercise, 97530 therapeutic activity, 97112 neuromuscular re-education, 97140 manual therapy, 97035 ultrasound, 97018 paraffin, 02960 fluidotherapy, 97760 Orthotic Initial, 97763 Orthotic/Prosthetic subsequent, passive range of motion, compression bandaging, coping strategies training, patient/family education, and DME and/or AE instructions  RECOMMENDED OTHER SERVICES: NA  CONSULTED AND AGREED WITH PLAN OF CARE: Patient  PLAN FOR NEXT SESSION: review HEP, attempt kinesiotape for edema management, Complete 9 hole peg test for goal section  See what plan is after visit with Dr. Erwin, would she benefit from transfer to allow for use of paraffin and/or fluidotherapy  Referring diagnosis? S/P L intertrachanteric femur fracture, Left distal radius fracture 04/28/2024 Treatment diagnosis? (if different than referring diagnosis)  Stiffness of left wrist, not elsewhere classified  Muscle weakness (generalized)  Other  disturbances of skin sensation  Other lack of coordination What was this (referring dx) caused by? []  Surgery [x]  Fall []  Ongoing  issue []  Arthritis []  Other: ____________  Laterality: []  Rt [x]  Lt []  Both  Check all possible CPT codes:  *CHOOSE 10 OR LESS*    See Planned Interventions listed in the Plan section of the Evaluation.     KAYLENE DOMINO, OTR/L 08/30/2024, 12:09 PM  [facility] at [facility] [address] [place], [address] [place], [place] [address] Phone # [phone] Fax # [phone]

## 2024-08-31 ENCOUNTER — Ambulatory Visit (INDEPENDENT_AMBULATORY_CARE_PROVIDER_SITE_OTHER)

## 2024-08-31 VITALS — Ht 63.0 in | Wt 138.0 lb

## 2024-08-31 DIAGNOSIS — Z0000 Encounter for general adult medical examination without abnormal findings: Secondary | ICD-10-CM

## 2024-08-31 NOTE — Progress Notes (Signed)
 Subjective:   Monica Curry is a 89 y.o. who presents for a Medicare Wellness preventive visit.  As a reminder, Annual Wellness Visits don't include a physical exam, and some assessments may be limited, especially if this visit is performed virtually. We may recommend an in-person follow-up visit with your provider if needed.  Visit Complete: Virtual I connected with  Company secretary on 08/31/24 by a audio enabled telemedicine application and verified that I am speaking with the correct person using two identifiers.  Patient Location: Home  Provider Location: Office/Clinic  I discussed the limitations of evaluation and management by telemedicine. The patient expressed understanding and agreed to proceed.  Vital Signs: Because this visit was a virtual/telehealth visit, some criteria may be missing or patient reported. Any vitals not documented were not able to be obtained and vitals that have been documented are patient reported.  VideoDeclined- This patient declined Librarian, academic. Therefore the visit was completed with audio only.  Persons Participating in Visit: Patient.  AWV Questionnaire: Yes: Patient Medicare AWV questionnaire was completed by the patient on 08/24/24; I have confirmed that all information answered by patient is correct and no changes since this date.  Cardiac Risk Factors include: advanced age (>15men, >79 women);dyslipidemia;hypertension     Objective:    Today's Vitals   08/31/24 1510  Weight: 138 lb (62.6 kg)  Height: 5' 3 (1.6 m)   Body mass index is 24.45 kg/m.     08/31/2024    3:18 PM 08/30/2024   12:07 PM 08/30/2024    9:37 AM 04/28/2024    2:58 PM 04/27/2024    8:41 PM 01/10/2022    2:50 PM 07/07/2021    7:33 AM  Advanced Directives  Does Patient Have a Medical Advance Directive? Yes Yes Yes Yes Yes Yes Yes  Type of Estate agent of [place];Living will Healthcare Power of [place];Living  will Healthcare Power of [place];Living will Healthcare Power of [place];Living will Healthcare Power of [place];Living will Healthcare Power of [place];Living will Healthcare Power of [place];Living will  Does patient want to make changes to medical advance directive? No - Patient declined   No - Patient declined No - Patient declined No - Patient declined No - Patient declined  Copy of Healthcare Power of Attorney in Chart? Yes - validated most recent copy scanned in chart (See row information)   No - copy requested No - copy requested Yes - validated most recent copy scanned in chart (See row information) Yes - validated most recent copy scanned in chart (See row information)  Would patient like information on creating a medical advance directive?       No - Patient declined    Current Medications (verified) Outpatient Encounter Medications as of 08/31/2024  Medication Sig   acetaminophen  (TYLENOL ) 500 MG tablet Take 500 mg by mouth every 6 (six) hours as needed for moderate pain.   apixaban  (ELIQUIS ) 5 MG TABS tablet Take 1 tablet (5 mg total) by mouth 2 (two) times daily.   atorvastatin  (LIPITOR) 10 MG tablet TAKE 1 TABLET BY MOUTH DAILY FOR CHOLESTEROL   Calcium  Carb-Cholecalciferol  (CALCIUM  PLUS VITAMIN D  PO) Take 1 tablet by mouth daily. 1 chewable daily   Cyanocobalamin  500 MCG CHEW Chew 1 tablet by mouth daily.   escitalopram  (LEXAPRO ) 5 MG tablet Take 1 tablet (5 mg total) by mouth daily.   hydrOXYzine  (ATARAX ) 25 MG tablet Take 1 tablet (25 mg total) by mouth daily.   pramipexole  (MIRAPEX )  0.125 MG tablet Take 1 tablet (0.125 mg total) by mouth at bedtime.   propranolol  (INDERAL ) 40 MG tablet Take 1 tablet (40 mg total) by mouth 4 (four) times daily. TAKE 1 TABLET BY MOUTH FOUR TIMES DAILY AT BEDTIME WITH MEALS FOR BLOOD PRESSURE OR TREMORS   VITAMIN D  PO Take 5,000 Units by mouth daily. 2 tablets daily   Facility-Administered Encounter Medications as of 08/31/2024  Medication    denosumab  (PROLIA ) injection 60 mg   denosumab  (PROLIA ) injection 60 mg   denosumab  (PROLIA ) injection 60 mg   Romosozumab -aqqg (EVENITY ) 105 MG/1. injection 210 mg    Allergies (verified) Codeine, Other, Cortisone, Dexamethasone , Hctz [hydrochlorothiazide ], Meloxicam , Mysoline [primidone], Pneumovax 23 [pneumococcal vac polyvalent], Prednisone , Topiramate , Toradol  [ketorolac  tromethamine ], and Ace inhibitors   History: Past Medical History:  Diagnosis Date   Allergy    Medications and pollen   Anemia    Anxiety    Always   C. difficile colitis    Cancer (HCC)    Endometrial cancer   Cataract    Had surgery 2021   Chronic kidney disease 2018   2 siblings had kidney failure, sister had transplant   Clotting disorder    Essential and other specified forms of tremor    Hyperlipidemia    Hypertension    Osteoporosis 11/2019   T score -2.8 stable from prior DEXA   Personal history of venous thrombosis and embolism 1997   Pulmonary embolism (HCC) 06/14/2021   Pulmonary embolus (HCC)    Shingles    Stroke (HCC) 2018   TIA   Thrush    TIA (transient ischemic attack)    carotid US  07/2022: no ICA stenosis bilaterally   Past Surgical History:  Procedure Laterality Date   ABDOMINAL HYSTERECTOMY  1997   APPENDECTOMY  1961   cataract surg Bilateral 2021   Dr. Roz, R eye 8/18, L 8/25   CESAREAN SECTION  1961   EYE SURGERY  2021   Cataract both eyes   FRACTURE SURGERY  2025   Broken hip   INTRAMEDULLARY (IM) NAIL INTERTROCHANTERIC Left 04/28/2024   Procedure: FIXATION, FRACTURE, INTERTROCHANTERIC, WITH INTRAMEDULLARY ROD;  Surgeon: Kendal Franky SQUIBB, MD;  Location: [facility] OR;  Service: Orthopedics;  Laterality: Left;   TONSILLECTOMY AND ADENOIDECTOMY  1944   TOTAL ABDOMINAL HYSTERECTOMY W/ BILATERAL SALPINGOOPHORECTOMY  1997   Family History  Problem Relation Age of Onset   Hypertension Mother    Cancer Mother        Unknown type   Dementia Mother    Varicose Veins  Mother    Cancer Father        Bone   Heart disease Sister    Blindness Sister 25       temporal arteritis   Skin cancer Sister        basal and melanoma   Heart disease Sister    Kidney failure Sister        Kidney transplant   Diabetes Sister    Hypertension Sister    Kidney disease Sister    Vision loss Sister    Varicose Veins Sister    Heart disease Brother    Prostate cancer Brother        Prostate   Heart disease Brother    Kidney failure Brother    Prostate cancer Brother        Prostate   Heart disease Brother    Cancer Brother    Kidney disease Brother    Vision  loss Brother    Tremor Neg Hx    Parkinson's disease Neg Hx    Social History   Socioeconomic History   Marital status: Widowed    Spouse name: Not on file   Number of children: 2   Years of education: Not on file   Highest education level: Bachelor's degree (e.g., BA, AB, BS)  Occupational History   Not on file  Tobacco Use   Smoking status: Never   Smokeless tobacco: Never  Vaping Use   Vaping status: Never Used  Substance and Sexual Activity   Alcohol  use: Not Currently   Drug use: Never    Types: Benzodiazepines    Comment: prescribed   Sexual activity: Not Currently    Birth control/protection: Post-menopausal, Surgical    Comment: HYST-1st intercourse 89 yo-Fewer than 5 partners  Other Topics Concern   Not on file  Social History Narrative   Right handed   Caffeine: none   Lives alone   1st dau dec 3 days old   2 grands   D[place] in [place]   Social Drivers of Health   Financial Resource Strain: Low Risk  (08/24/2024)   Overall Financial Resource Strain (CARDIA)    Difficulty of Paying Living Expenses: Not hard at all  Food Insecurity: No Food Insecurity (08/24/2024)   Hunger Vital Sign    Worried About Running Out of Food in the Last Year: Never true    Ran Out of Food in the Last Year: Never true  Transportation Needs: No Transportation Needs (08/24/2024)   PRAPARE -  Administrator, Civil Service (Medical): No    Lack of Transportation (Non-Medical): No  Physical Activity: Inactive (08/24/2024)   Exercise Vital Sign    Days of Exercise per Week: 0 days    Minutes of Exercise per Session: 0 min  Stress: No Stress Concern Present (08/24/2024)   [employer] of Occupational Health - Occupational Stress Questionnaire    Feeling of Stress: Only a little  Social Connections: Moderately Integrated (08/24/2024)   Social Connection and Isolation Panel    Frequency of Communication with Friends and Family: Three times a week    Frequency of Social Gatherings with Friends and Family: Once a week    Attends Religious Services: More than 4 times per year    Active Member of [employer] or Organizations: Yes    Attends Banker Meetings: More than 4 times per year    Marital Status: Widowed    Tobacco Counseling Counseling given: Not Answered    Clinical Intake:  Pre-visit preparation completed: Yes  Pain : No/denies pain     BMI - recorded: 24.45 Nutritional Status: BMI of 19-24  Normal Nutritional Risks: None Diabetes: No  Lab Results  Component Value Date   HGBA1C 5.7 (H) 09/01/2023   HGBA1C 5.6 03/19/2023   HGBA1C 5.5 08/28/2022     How often do you need to have someone help you when you read instructions, pamphlets, or other written materials from your doctor or pharmacy?: 1 - Never  Interpreter Needed?: No  Information entered by :: Ellouise Haws, LPN   Activities of Daily Living     08/24/2024   10:27 AM 04/27/2024    8:41 PM  In your present state of health, do you have any difficulty performing the following activities:  Hearing? 0 0  Vision? 0 0  Difficulty concentrating or making decisions? 0 0  Walking or climbing stairs? 1   Comment broke  L hip   Dressing or bathing? 0   Doing errands, shopping? 1 0  Preparing Food and eating ? N   Using the Toilet? N   In the past six months, have you accidently  leaked urine? N   Do you have problems with loss of bowel control? N   Managing your Medications? N   Managing your Finances? N   Housekeeping or managing your Housekeeping? N     Patient Care Team: Wendolyn Jenkins Jansky, MD as PCP - General (Family Medicine) Okey Vina GAILS, MD as PCP - Cardiology (Cardiology) Donnald Charleston, MD as Consulting Physician (Gastroenterology) Roz Anes, MD as Consulting Physician (Ophthalmology) Clance, Francis HERO, MD as Consulting Physician (Pulmonary Disease) Fontaine, Evalene SQUIBB, MD (Inactive) as Consulting Physician (Gynecology) Love, Lynwood HERO, MD as Consulting Physician (Neurology) Buck Saucer, MD as Attending Physician (Neurology) Lynnell Nottingham, MD as Consulting Physician (Dermatology) Caleen Dirks, MD as Consulting Physician (Internal Medicine)  I have updated your Care Teams any recent Medical Services you may have received from other providers in the past year.     Assessment:   This is a routine wellness examination for Monica Curry.  Hearing/Vision screen Hearing Screening - Comments:: Pt denies any hearing issues  Vision Screening - Comments:: Wears rx glasses - up to date with routine eye exams with Dr Glendia Gaudy    Goals Addressed             This Visit's Progress    Patient Stated       Pt's working toward using a cane to advance        Depression Screen     08/31/2024    3:15 PM 04/01/2024   10:00 AM 01/10/2022    3:01 PM 08/14/2021    2:29 PM 07/31/2021   11:25 PM 01/10/2021    2:20 PM 06/29/2020    2:26 PM  PHQ 2/9 Scores  PHQ - 2 Score 0 0 0 1 0 0 0    Fall Risk     08/24/2024   10:27 AM 04/27/2024   10:39 AM 04/01/2024    9:59 AM 01/10/2022    3:00 PM 07/31/2021   11:25 PM  Fall Risk   Falls in the past year? 1 0 0 0 0  Number falls in past yr: 0  0 0   Injury with Fall? 1 0 0 0   Risk for fall due to : History of fall(s);Impaired mobility  No Fall Risks No Fall Risks No Fall Risks  Follow up Falls prevention discussed  Falls  evaluation completed Falls prevention discussed;Falls evaluation completed  Falls evaluation completed;Education provided;Falls prevention discussed      Data saved with a previous flowsheet row definition    MEDICARE RISK AT HOME:  Medicare Risk at Home Any stairs in or around the home?: (Patient-Rptd) Yes If so, are there any without handrails?: (Patient-Rptd) No Home free of loose throw rugs in walkways, pet beds, electrical cords, etc?: (Patient-Rptd) Yes Adequate lighting in your home to reduce risk of falls?: (Patient-Rptd) Yes Life alert?: (Patient-Rptd) Yes Use of a cane, walker or w/c?: (Patient-Rptd) Yes Grab bars in the bathroom?: (Patient-Rptd) Yes Shower chair or bench in shower?: (Patient-Rptd) Yes Elevated toilet seat or a handicapped toilet?: (Patient-Rptd) Yes  TIMED UP AND GO:  Was the test performed?  No  Cognitive Function: 6CIT completed        08/31/2024    3:21 PM  6CIT Screen  What Year? 0 points  What month? 0 points  What time? 0 points  Count back from 20 0 points  Months in reverse 0 points  Repeat phrase 0 points  Total Score 0 points    Immunizations Immunization History  Administered Date(s) Administered   H1N1 01/03/2009   INFLUENZA, HIGH DOSE SEASONAL PF 09/06/2014, 08/23/2015, 08/21/2016, 08/28/2017, 09/01/2018, 09/14/2019, 09/19/2020, 10/02/2021, 10/03/2022, 09/01/2023   Influenza Whole 09/23/2013   PFIZER(Purple Top)SARS-COV-2 Vaccination 12/29/2019, 01/16/2020, 09/06/2020, 05/04/2021   PPD Test 03/29/2014   Pfizer Covid-19 Vaccine Bivalent Booster 13yrs & up 12/19/2022   Pneumococcal Polysaccharide-23 03/29/2014   Pneumococcal-Unspecified 12/09/2002   Td 03/23/2013   Zoster, Live 12/09/2006    Screening Tests Health Maintenance  Topic Date Due   DTaP/Tdap/Td (2 - Tdap) 03/24/2023   Influenza Vaccine  07/09/2024   COVID-19 Vaccine (6 - 2025-26 season) 08/09/2024   Mammogram  01/08/2025   Medicare Annual Wellness (AWV)   08/31/2025   DEXA SCAN  Completed   HPV VACCINES  Aged Out   Meningococcal B Vaccine  Aged Out   Pneumococcal Vaccine: 50+ Years  Discontinued   Zoster Vaccines- Shingrix  Discontinued    Health Maintenance Items Addressed: See Nurse Notes at the end of this note  Additional Screening:  Vision Screening: Recommended annual ophthalmology exams for early detection of glaucoma and other disorders of the eye. Is the patient up to date with their annual eye exam?  Yes  Who is the provider or what is the name of the office in which the patient attends annual eye exams? Dr Glendia Gaudy   Dental Screening: Recommended annual dental exams for proper oral hygiene  Community Resource Referral / Chronic Care Management: CRR required this visit?  No   CCM required this visit?  No   Plan:    I have personally reviewed and noted the following in the patient's chart:   Medical and social history Use of alcohol , tobacco or illicit drugs  Current medications and supplements including opioid prescriptions. Patient is not currently taking opioid prescriptions. Functional ability and status Nutritional status Physical activity Advanced directives List of other physicians Hospitalizations, surgeries, and ER visits in previous 12 months Vitals Screenings to include cognitive, depression, and falls Referrals and appointments  In addition, I have reviewed and discussed with patient certain preventive protocols, quality metrics, and best practice recommendations. A written personalized care plan for preventive services as well as general preventive health recommendations were provided to patient.   Ellouise VEAR Haws, LPN   [id number]   After Visit Summary: (MyChart) Due to this being a telephonic visit, the after visit summary with patients personalized plan was offered to patient via MyChart   Notes: Nothing significant to report at this time. Vaccinations discussed

## 2024-08-31 NOTE — Patient Instructions (Signed)
 Monica Curry,  Thank you for taking the time for your Medicare Wellness Visit. I appreciate your continued commitment to your health goals. Please review the care plan we discussed, and feel free to reach out if I can assist you further.  Medicare recommends these wellness visits once per year to help you and your care team stay ahead of potential health issues. These visits are designed to focus on prevention, allowing your provider to concentrate on managing your acute and chronic conditions during your regular appointments.  Please note that Annual Wellness Visits do not include a physical exam. Some assessments may be limited, especially if the visit was conducted virtually. If needed, we may recommend a separate in-person follow-up with your provider.  Ongoing Care Seeing your primary care provider every 3 to 6 months helps us  monitor your health and provide consistent, personalized care.   Referrals If a referral was made during today's visit and you haven't received any updates within two weeks, please contact the referred provider directly to check on the status.  Recommended Screenings:  Health Maintenance  Topic Date Due   DTaP/Tdap/Td vaccine (2 - Tdap) 03/24/2023   Medicare Annual Wellness Visit  03/18/2024   Flu Shot  07/09/2024   COVID-19 Vaccine (6 - 2025-26 season) 08/09/2024   Breast Cancer Screening  01/08/2025   DEXA scan (bone density measurement)  Completed   HPV Vaccine  Aged Out   Meningitis B Vaccine  Aged Out   Pneumococcal Vaccine for age over 1  Discontinued   Zoster (Shingles) Vaccine  Discontinued       08/31/2024    3:18 PM  Advanced Directives  Does Patient Have a Medical Advance Directive? Yes  Type of Estate agent of [place];Living will  Does patient want to make changes to medical advance directive? No - Patient declined  Copy of Healthcare Power of Attorney in Chart? Yes - validated most recent copy scanned in chart (See  row information)   Advance Care Planning is important because it: Ensures you receive medical care that aligns with your values, goals, and preferences. Provides guidance to your family and loved ones, reducing the emotional burden of decision-making during critical moments.  Vision: Annual vision screenings are recommended for early detection of glaucoma, cataracts, and diabetic retinopathy. These exams can also reveal signs of chronic conditions such as diabetes and high blood pressure.  Dental: Annual dental screenings help detect early signs of oral cancer, gum disease, and other conditions linked to overall health, including heart disease and diabetes.  Please see the attached documents for additional preventive care recommendations.

## 2024-09-01 ENCOUNTER — Ambulatory Visit (INDEPENDENT_AMBULATORY_CARE_PROVIDER_SITE_OTHER): Admitting: Orthopedic Surgery

## 2024-09-01 ENCOUNTER — Other Ambulatory Visit (INDEPENDENT_AMBULATORY_CARE_PROVIDER_SITE_OTHER): Payer: Self-pay

## 2024-09-01 ENCOUNTER — Encounter: Payer: Medicare PPO | Admitting: Nurse Practitioner

## 2024-09-01 DIAGNOSIS — R200 Anesthesia of skin: Secondary | ICD-10-CM

## 2024-09-01 NOTE — Progress Notes (Signed)
 Monica Curry - 89 y.o. female MRN [id number]  Date of birth: June 18, 1931  Office Visit Note: Visit Date: 09/01/2024 PCP: Wendolyn Jenkins Jansky, MD Referred by: Danton Lauraine LABOR, PA-C  Subjective: No chief complaint on file.  HPI: Monica Curry is a pleasant 89 y.o. female who presents today for evaluation of ongoing numbness in the left hand, particular to the index, long and ring finger.  She has a history notable for recent left intertrochanteric femur fracture with associated left distal radius fracture from May of this year.  She underwent cephalomedullary nailing of the left intertrochanteric femur fracture at that time, nonoperative care of the left distal radius fracture with splinting and follow-up.  Her left wrist has healed in a slightly shortened and dorsally angulated position, however her pain is well-controlled and she is making appropriate progress from a range of motion and usage standpoint.  She states that she is having ongoing numbness particularly to the radial sided digits of the hand that has been persistent since her injury.  She has been doing occupational therapy without significant improvement.  Pertinent ROS were reviewed with the patient and found to be negative unless otherwise specified above in HPI.   Visit Reason: left hand numbness Duration of symptoms: 4 months Hand dominance: right Occupation: retired Diabetic: No Smoking: No Heart/Lung History: Cardiovascular and Mediastinum Essential hypertension Complete right bundle branch block Paroxysmal supraventricular tachycardia Aortic atherosclerosis (HCC) - CT 06/2021 Blood Thinners: Eliquis   Prior Testing/EMG: none Injections (Date): none Treatments: OT Prior Surgery: none  Assessment & Plan: Visit Diagnoses:  1. Numbness of left hand     Plan: Extensive discussion was had with the patient today regarding her left hand complaints.  Based on her clinical examination today, she is demonstrating  signs symptoms consistent with left-sided carpal tunnel syndrome in the setting of the recent left wrist extra-articular distal radius fracture.  We discussed the underlying etiology and pathophysiology of carpal tunnel syndrome as well as treat modalities ranging from conservative to surgical.  From a workup standpoint, I discussed obtaining a left upper extremity electrodiagnostic study in order to better delineate the severity of her nerve compression and to look for any external sites, particular from a double crush phenomenon given her age and comorbidities.  She expressed full understanding, will undergo the EMG as scheduled for the left side and return to me afterwards to discuss the results and appropriate next steps.  I spent 30 minutes in the care of this patient today including review of previous documentation, imaging obtained, face-to-face time discussing all options regarding treatment and documenting the encounter.   Follow-up: No follow-ups on file.   Meds & Orders: No orders of the defined types were placed in this encounter.   Orders Placed This Encounter  Procedures   XR Hand Complete Left   XR Wrist Complete Left   Ambulatory referral to Physical Medicine Rehab     Procedures: No procedures performed      Clinical History: No specialty comments available.  She reports that she has never smoked. She has never used smokeless tobacco. No results for input(s): HGBA1C, LABURIC in the last 8760 hours.  Objective:   Vital Signs: There were no vitals taken for this visit.  Physical Exam  Gen: Well-appearing, in no acute distress; non-toxic CV: Regular Rate. Well-perfused. Warm.  Resp: Breathing unlabored on room air; no wheezing. Psych: Fluid speech in conversation; appropriate affect; normal thought process  Ortho Exam Left upper extremity: - Skin is  intact, minimal swelling, no significant tenderness over the distal radius or hand region - Wrist range of motion  flexion 25, extension 35, able to form near composite fist, improved passively - Sensation is severely diminished in the median nerve distribution, positive Tinel's, positive Phalen's, positive Durkan's compression - APB 4/5 with mild thenar atrophy, thumb opposition is preserved  Imaging: XR Hand Complete Left Result Date: 09/01/2024 Diffuse degenerative changes are notable to the small joints of the hand, particularly the DIP intervals index through small finger.  There is notable degenerative change at the [facility] articulation as well with joint space narrowing and osteophyte formation, associated subchondral sclerosis.  XR Wrist Complete Left Result Date: 09/01/2024 X-rays of the left wrist demonstrate well-healed distal radius fracture with notable shortening, residual ulnar positivity.  Dorsal angulation is notable to the distal radius, radiocarpal articulation is preserved   Past Medical/Family/Surgical/Social History: Medications & Allergies reviewed per EMR, new medications updated. Patient Active Problem List   Diagnosis Date Noted   RLS (restless legs syndrome) 06/15/2024   Fracture, Colles, left, closed 04/28/2024   Hip fracture (HCC) 04/27/2024   GAD (generalized anxiety disorder) 04/01/2024   Thyroid  nodule 01/10/2022   Anxiety 08/10/2021   Aortic atherosclerosis (HCC) - CT 06/2021 06/15/2021   Bilateral renal cysts 07/06/2020   Paroxysmal supraventricular tachycardia 10/20/2019   History of skin cancer (basal cell) 06/16/2019   Complete right bundle branch block 06/16/2019   B12 deficiency 06/15/2019   History of TIA (transient ischemic attack) 10/23/2017   Chronic kidney disease, stage 3b (HCC) 07/21/2015   Medication management 07/04/2014   Essential hypertension 11/25/2013   Hyperlipidemia, mixed 11/25/2013   Abnormal glucose 11/25/2013   Vitamin D  deficiency 11/25/2013   Essential tremor 11/25/2013   History of endometrial cancer    Hx/o DVT/PE    Urethral prolapse     Osteoporosis 10/10/2011   Past Medical History:  Diagnosis Date   Allergy    Medications and pollen   Anemia    Anxiety    Always   C. difficile colitis    Cancer (HCC)    Endometrial cancer   Cataract    Had surgery 2021   Chronic kidney disease 2018   2 siblings had kidney failure, sister had transplant   Clotting disorder    Essential and other specified forms of tremor    Hyperlipidemia    Hypertension    Osteoporosis 11/2019   T score -2.8 stable from prior DEXA   Personal history of venous thrombosis and embolism 1997   Pulmonary embolism (HCC) 06/14/2021   Pulmonary embolus (HCC)    Shingles    Stroke (HCC) 2018   TIA   Thrush    TIA (transient ischemic attack)    carotid US  07/2022: no ICA stenosis bilaterally   Family History  Problem Relation Age of Onset   Hypertension Mother    Cancer Mother        Unknown type   Dementia Mother    Varicose Veins Mother    Cancer Father        Bone   Heart disease Sister    Blindness Sister 75       temporal arteritis   Skin cancer Sister        basal and melanoma   Heart disease Sister    Kidney failure Sister        Kidney transplant   Diabetes Sister    Hypertension Sister    Kidney disease Sister  Vision loss Sister    Varicose Veins Sister    Heart disease Brother    Prostate cancer Brother        Prostate   Heart disease Brother    Kidney failure Brother    Prostate cancer Brother        Prostate   Heart disease Brother    Cancer Brother    Kidney disease Brother    Vision loss Brother    Tremor Neg Hx    Parkinson's disease Neg Hx    Past Surgical History:  Procedure Laterality Date   ABDOMINAL HYSTERECTOMY  1997   APPENDECTOMY  1961   cataract surg Bilateral 2021   Dr. Roz, R eye 8/18, L 8/25   CESAREAN SECTION  1961   EYE SURGERY  2021   Cataract both eyes   FRACTURE SURGERY  2025   Broken hip   INTRAMEDULLARY (IM) NAIL INTERTROCHANTERIC Left 04/28/2024   Procedure:  FIXATION, FRACTURE, INTERTROCHANTERIC, WITH INTRAMEDULLARY ROD;  Surgeon: Kendal Franky SQUIBB, MD;  Location: [facility] OR;  Service: Orthopedics;  Laterality: Left;   TONSILLECTOMY AND ADENOIDECTOMY  1944   TOTAL ABDOMINAL HYSTERECTOMY W/ BILATERAL SALPINGOOPHORECTOMY  1997   Social History   Occupational History   Not on file  Tobacco Use   Smoking status: Never   Smokeless tobacco: Never  Vaping Use   Vaping status: Never Used  Substance and Sexual Activity   Alcohol  use: Not Currently   Drug use: Never    Types: Benzodiazepines    Comment: prescribed   Sexual activity: Not Currently    Birth control/protection: Post-menopausal, Surgical    Comment: HYST-1st intercourse 89 yo-Fewer than 5 partners    Aramis Weil Estela) Arlinda, M.D. [facility], Hand Surgery

## 2024-09-06 ENCOUNTER — Ambulatory Visit: Admitting: Physical Therapy

## 2024-09-06 ENCOUNTER — Encounter: Payer: Self-pay | Admitting: Physical Therapy

## 2024-09-06 DIAGNOSIS — M25632 Stiffness of left wrist, not elsewhere classified: Secondary | ICD-10-CM | POA: Diagnosis not present

## 2024-09-06 DIAGNOSIS — R2689 Other abnormalities of gait and mobility: Secondary | ICD-10-CM

## 2024-09-06 DIAGNOSIS — R2681 Unsteadiness on feet: Secondary | ICD-10-CM | POA: Diagnosis not present

## 2024-09-06 DIAGNOSIS — R208 Other disturbances of skin sensation: Secondary | ICD-10-CM | POA: Diagnosis not present

## 2024-09-06 DIAGNOSIS — M6281 Muscle weakness (generalized): Secondary | ICD-10-CM | POA: Diagnosis not present

## 2024-09-06 DIAGNOSIS — R278 Other lack of coordination: Secondary | ICD-10-CM | POA: Diagnosis not present

## 2024-09-06 NOTE — Therapy (Signed)
 OUTPATIENT PHYSICAL THERAPY NEURO TREATMENT NOTE   Patient Name: Monica Curry MRN: [id number] DOB:1931/12/02, 89 y.o., female Today's Date: 09/06/2024   PCP: Wendolyn Jenkins Jansky, MD REFERRING PROVIDER: Danton Lauraine LABOR, PA-C   END OF SESSION:  PT End of Session - 09/06/24 0851     Visit Number 2    Number of Visits 17    Date for Recertification  10/29/24    Authorization Type [employer]    Authorization Time Period 08/30/2024-10/29/2024    Authorization - Visit Number 2    Authorization - Number of Visits 17    Progress Note Due on Visit 10    PT Start Time 0849    PT Stop Time 0930    PT Time Calculation (min) 41 min    Equipment Utilized During Treatment Gait belt    Activity Tolerance Patient tolerated treatment well    Behavior During Therapy [facility] for tasks assessed/performed           Past Medical History:  Diagnosis Date   Allergy    Medications and pollen   Anemia    Anxiety    Always   C. difficile colitis    Cancer (HCC)    Endometrial cancer   Cataract    Had surgery 2021   Chronic kidney disease 2018   2 siblings had kidney failure, sister had transplant   Clotting disorder    Essential and other specified forms of tremor    Hyperlipidemia    Hypertension    Osteoporosis 11/2019   T score -2.8 stable from prior DEXA   Personal history of venous thrombosis and embolism 1997   Pulmonary embolism (HCC) 06/14/2021   Pulmonary embolus (HCC)    Shingles    Stroke (HCC) 2018   TIA   Thrush    TIA (transient ischemic attack)    carotid US  07/2022: no ICA stenosis bilaterally   Past Surgical History:  Procedure Laterality Date   ABDOMINAL HYSTERECTOMY  1997   APPENDECTOMY  1961   cataract surg Bilateral 2021   Dr. Roz, R eye 8/18, L 8/25   CESAREAN SECTION  1961   EYE SURGERY  2021   Cataract both eyes   FRACTURE SURGERY  2025   Broken hip   INTRAMEDULLARY (IM) NAIL INTERTROCHANTERIC Left 04/28/2024   Procedure: FIXATION, FRACTURE,  INTERTROCHANTERIC, WITH INTRAMEDULLARY ROD;  Surgeon: Kendal Franky SQUIBB, MD;  Location: [facility] OR;  Service: Orthopedics;  Laterality: Left;   TONSILLECTOMY AND ADENOIDECTOMY  1944   TOTAL ABDOMINAL HYSTERECTOMY W/ BILATERAL SALPINGOOPHORECTOMY  1997   Patient Active Problem List   Diagnosis Date Noted   RLS (restless legs syndrome) 06/15/2024   Fracture, Colles, left, closed 04/28/2024   Hip fracture (HCC) 04/27/2024   GAD (generalized anxiety disorder) 04/01/2024   Thyroid  nodule 01/10/2022   Anxiety 08/10/2021   Aortic atherosclerosis (HCC) - CT 06/2021 06/15/2021   Bilateral renal cysts 07/06/2020   Paroxysmal supraventricular tachycardia 10/20/2019   History of skin cancer (basal cell) 06/16/2019   Complete right bundle branch block 06/16/2019   B12 deficiency 06/15/2019   History of TIA (transient ischemic attack) 10/23/2017   Chronic kidney disease, stage 3b (HCC) 07/21/2015   Medication management 07/04/2014   Essential hypertension 11/25/2013   Hyperlipidemia, mixed 11/25/2013   Abnormal glucose 11/25/2013   Vitamin D  deficiency 11/25/2013   Essential tremor 11/25/2013   History of endometrial cancer    Hx/o DVT/PE    Urethral prolapse    Osteoporosis 10/10/2011  ONSET DATE: 04/28/2024  REFERRING DIAG: S/P L intertrachanteric femur fracture, Left distal radius fracture 04/28/2024   THERAPY DIAG:  Muscle weakness (generalized)  Unsteadiness on feet  Other abnormalities of gait and mobility  Rationale for Evaluation and Treatment: Rehabilitation  SUBJECTIVE:                                                                                                                                                                                             SUBJECTIVE STATEMENT: Saw the hand specialist; he recommends nerve conduction velocity test and perhaps surgery to relieve CTS for L hand.  Continuing to have swelling in LLE, but that has been that way since surgery.   Pt  accompanied by: self  PERTINENT HISTORY: osteoporosis, hx of DVT; see additional PMH above  PAIN:  Are you having pain? No  PRECAUTIONS: Fall, wears L compression stocking due to L LE edema (was negative for DVT)  RED FLAGS: None   WEIGHT BEARING RESTRICTIONS: No  FALLS: Has patient fallen in last 6 months? Yes. Number of falls 1  LIVING ENVIRONMENT: Lives with: lives alone Lives in: House/apartment Stairs: 3 steps to enter home;  Has following equipment at home: Single point cane and Walker - 2 wheeled *cane has tripod base PLOF: Independent  PATIENT GOALS: To be able to transition to the cane, so I can get in and out of home better.  OBJECTIVE:    TODAY'S TREATMENT: 09/06/2024 Activity Comments  Seated march, 2 x 10 Seated LAQ, 2 x 10 2#  Sidestepping 3 reps at counter Cues for foot placement  Forward/back walking at counter x 4 reps Cues for equal, even foot placement  Stagger stance forward/back rocking 10 reps, then LLE as stance with RLE forward/back stepping 1 UE support  Step taps LLE as stance, RLE 2 x 10 taps, then alternating  Light BUE>then 1 UE support  Gait training with cane, 50 ft multiple reps Min guard with initial cues for sequence, mild LOB x 1 with turn; tends to look down initially with gait; good sequencing with cane  Stair negotiation-step-to pattern with R hand rail   LLE forward step up, x 15 reps For LLE strengthening    -------------------------------------------- Note: Objective measures were completed at Evaluation unless otherwise noted.  DIAGNOSTIC FINDINGS: Interval proximal left femoral cephalomedullary nail fixation of the previously seen intertrochanteric fracture. Improved alignment.  COGNITION: Overall cognitive status: Within functional limits for tasks assessed   SENSATION: Reports decreased sensation in L fingers (to see hand surgeon)  EDEMA:  Noted LLE; wearing compression stocking  POSTURE: rounded shoulders and  increased thoracic kyphosis  LOWER EXTREMITY ROM:     Active  Right Eval Left Eval  Hip flexion    Hip extension    Hip abduction    Hip adduction    Hip internal rotation    Hip external rotation    Knee flexion    Knee extension    Ankle dorsiflexion  10  Ankle plantarflexion  40  Ankle inversion    Ankle eversion     (Blank rows = not tested)  LOWER EXTREMITY MMT:    MMT Right Eval Left Eval  Hip flexion 4 3+  Hip extension    Hip abduction 5 4  Hip adduction 5 4  Hip internal rotation    Hip external rotation    Knee flexion 4+ 4+  Knee extension 4+ 4  Ankle dorsiflexion 4+ 4  Ankle plantarflexion    Ankle inversion    Ankle eversion    (Blank rows = not tested)  TRANSFERS: Sit to stand: Modified independence  Assistive device utilized: None     Stand to sit: Modified independence  Assistive device utilized: hands at chair      STAIRS: Findings: Level of Assistance: Modified independence, Stair Negotiation Technique: Step to Pattern with Single Rail on Right, Number of Stairs: 2-3, Height of Stairs: 4-6   , and Comments: prior to fracture, pt negotiated steps alternating pattern GAIT: Findings: Gait Characteristics: step through pattern, decreased step length- Right, and decreased stance time- Left, Distance walked: 50 ft x 2, Assistive device utilized:Walker - 2 wheeled and SPC with small quad tip base, Level of assistance: Modified independence, CGA, and Min A, and Comments: Mod I with RW, CGA/min assist with cane  FUNCTIONAL TESTS:  10 meter walk test: 16.78 sec RW (1.95 ft/sec); 33.62 sec with cane (0.98 ft/sec) Berg:  34/56 (Scores <45/56 indicate increased fall risk)                                                                                                                               TREATMENT DATE: 08/30/2024    PATIENT EDUCATION: Education details: Eval results, POC, continue HEP from HHPT; verbally added lateral weightshifting to LLE,  with attention to light UE support Person educated: Patient Education method: Explanation, Demonstration, and Verbal cues Education comprehension: verbalized understanding, returned demonstration, and needs further education  HOME EXERCISE PROGRAM: Verbally initiated lateral weightshifting; pt doing standing hip kicks, heel/toe raises, marching and sit to stand at home  GOALS: Goals reviewed with patient? Yes  SHORT TERM GOALS: Target date: 10/01/2024  Pt will be independent with HEP for improved gait, stair negotiation, balance. Baseline: Goal status: IN PROGRESS  2.  Pt will improve Berg score to at least 45/56 to decrease fall risk. Baseline: 34/56 Goal status: IN PROGRESS  3.  Pt will negotiate curb and 3 steps with cane, mod I, for improved household and community gait. Baseline:  Goal status: IN PROGRESS   LONG TERM GOALS: Target  date: 10/29/2024  Pt will be independent with HEP for improved balance, strength, gait, stairs. Baseline:  Goal status: IN PROGRESS  2.  Pt will improve DGI score to at least 20/24 to decrease fall risk. Baseline: TBD Goal status:IN PROGRESS  3.  Pt will improve gait velocity to at least 2.6 ft/sec with cane for improved gait efficiency and safety. Baseline: 0.98 ft/sec Goal status: IN PROGRESS  4.  Pt will ambulate at least 500 ft, indoor and outdoor gait, mod I with cane for improved community mobility. Baseline:  Goal status: IN PROGRESS   ASSESSMENT:  CLINICAL IMPRESSION: Pt presents today with reports that hand surgeon is recommending nerve conduction study for LUE and possible surgery to relieve CTS for LUE.  Skilled PT session focused on seated strengthening, standing balance, and gait training with cane with small quad tip base. Pt does a nice job sequencing gait with cane in RUE; she does tend to look down for now to make sure she has sequence correct.  She will continue to benefit from skilled PT towards goals for improved  functional mobility, independence, and decreased fall risk.   OBJECTIVE IMPAIRMENTS: Abnormal gait, decreased balance, decreased mobility, difficulty walking, and decreased strength.   ACTIVITY LIMITATIONS: standing, stairs, transfers, and locomotion level  PARTICIPATION LIMITATIONS: meal prep, cleaning, driving, shopping, community activity, yard work, and church  PERSONAL FACTORS: 3+ comorbidities: see PMH above are also affecting patient's functional outcome.   REHAB POTENTIAL: Good  CLINICAL DECISION MAKING: Evolving/moderate complexity  EVALUATION COMPLEXITY: Moderate  PLAN:  PT FREQUENCY: 2x/week  PT DURATION: 8 weeks plus eval visit  PLANNED INTERVENTIONS: 97750- Physical Performance Testing, 97110-Therapeutic exercises, 97530- Therapeutic activity, 97112- Neuromuscular re-education, 97535- Self Care, [id number]- Manual therapy, [phone]- Gait training, Patient/Family education, Balance training, Stair training, and DME instructions  PLAN FOR NEXT SESSION: Initiate HEP (to address LLE strength, SLS, balance)-at counter, as she would have at home; gait training with cane; stair negotiation with cane.  Perform DGI test with cane   Lycia Sachdeva W., PT 09/06/2024, 10:30 AM  [facility] [address] [place], [address] [place], [place] [address] Phone # [phone] Fax # [phone]  Referring diagnosis? S/P L intertrachanteric femur fracture, Left distal radius fracture 04/28/2024  Treatment diagnosis? (if different than referring diagnosis) R 26.81, R 26.89, M62.81 What was this (referring dx) caused by? []  Surgery [x]  Fall []  Ongoing issue []  Arthritis []  Other: ____________  Laterality: []  Rt [x]  Lt []  Both  Check all possible CPT codes:  *CHOOSE 10 OR LESS*    See Planned Interventions listed in the Plan section of the Evaluation.

## 2024-09-09 ENCOUNTER — Ambulatory Visit: Attending: Student | Admitting: Physical Therapy

## 2024-09-09 ENCOUNTER — Encounter: Payer: Self-pay | Admitting: Physical Therapy

## 2024-09-09 DIAGNOSIS — M25632 Stiffness of left wrist, not elsewhere classified: Secondary | ICD-10-CM | POA: Insufficient documentation

## 2024-09-09 DIAGNOSIS — R41840 Attention and concentration deficit: Secondary | ICD-10-CM | POA: Diagnosis not present

## 2024-09-09 DIAGNOSIS — R278 Other lack of coordination: Secondary | ICD-10-CM | POA: Insufficient documentation

## 2024-09-09 DIAGNOSIS — M6281 Muscle weakness (generalized): Secondary | ICD-10-CM | POA: Diagnosis not present

## 2024-09-09 DIAGNOSIS — R2689 Other abnormalities of gait and mobility: Secondary | ICD-10-CM | POA: Insufficient documentation

## 2024-09-09 DIAGNOSIS — R41842 Visuospatial deficit: Secondary | ICD-10-CM | POA: Diagnosis not present

## 2024-09-09 DIAGNOSIS — R208 Other disturbances of skin sensation: Secondary | ICD-10-CM | POA: Insufficient documentation

## 2024-09-09 DIAGNOSIS — R2681 Unsteadiness on feet: Secondary | ICD-10-CM | POA: Diagnosis not present

## 2024-09-09 NOTE — Patient Instructions (Signed)
 Adjustable weight ankle weights Look for 2-8# or 2-10# weights that have bars to adjust (you will likely start with 2# at home)

## 2024-09-09 NOTE — Therapy (Signed)
 OUTPATIENT PHYSICAL THERAPY NEURO TREATMENT NOTE   Patient Name: Monica Curry MRN: [id number] DOB:1931/11/03, 89 y.o., female Today's Date: 09/09/2024   PCP: Wendolyn Jenkins Jansky, MD REFERRING PROVIDER: Danton Lauraine LABOR, PA-C   END OF SESSION:  PT End of Session - 09/09/24 0847     Visit Number 3    Number of Visits 17    Date for Recertification  10/29/24    Authorization Type [employer]    Authorization Time Period 08/30/2024-10/29/2024    Authorization - Visit Number 3    Authorization - Number of Visits 17    Progress Note Due on Visit 10    PT Start Time 0849    PT Stop Time 0932    PT Time Calculation (min) 43 min    Equipment Utilized During Treatment Gait belt    Activity Tolerance Patient tolerated treatment well    Behavior During Therapy [facility] for tasks assessed/performed            Past Medical History:  Diagnosis Date   Allergy    Medications and pollen   Anemia    Anxiety    Always   C. difficile colitis    Cancer (HCC)    Endometrial cancer   Cataract    Had surgery 2021   Chronic kidney disease 2018   2 siblings had kidney failure, sister had transplant   Clotting disorder    Essential and other specified forms of tremor    Hyperlipidemia    Hypertension    Osteoporosis 11/2019   T score -2.8 stable from prior DEXA   Personal history of venous thrombosis and embolism 1997   Pulmonary embolism (HCC) 06/14/2021   Pulmonary embolus (HCC)    Shingles    Stroke (HCC) 2018   TIA   Thrush    TIA (transient ischemic attack)    carotid US  07/2022: no ICA stenosis bilaterally   Past Surgical History:  Procedure Laterality Date   ABDOMINAL HYSTERECTOMY  1997   APPENDECTOMY  1961   cataract surg Bilateral 2021   Dr. Roz, R eye 8/18, L 8/25   CESAREAN SECTION  1961   EYE SURGERY  2021   Cataract both eyes   FRACTURE SURGERY  2025   Broken hip   INTRAMEDULLARY (IM) NAIL INTERTROCHANTERIC Left 04/28/2024   Procedure: FIXATION,  FRACTURE, INTERTROCHANTERIC, WITH INTRAMEDULLARY ROD;  Surgeon: Kendal Franky SQUIBB, MD;  Location: [facility] OR;  Service: Orthopedics;  Laterality: Left;   TONSILLECTOMY AND ADENOIDECTOMY  1944   TOTAL ABDOMINAL HYSTERECTOMY W/ BILATERAL SALPINGOOPHORECTOMY  1997   Patient Active Problem List   Diagnosis Date Noted   RLS (restless legs syndrome) 06/15/2024   Fracture, [place], left, closed 04/28/2024   Hip fracture (HCC) 04/27/2024   GAD (generalized anxiety disorder) 04/01/2024   Thyroid  nodule 01/10/2022   Anxiety 08/10/2021   Aortic atherosclerosis (HCC) - CT 06/2021 06/15/2021   Bilateral renal cysts 07/06/2020   Paroxysmal supraventricular tachycardia 10/20/2019   History of skin cancer (basal cell) 06/16/2019   Complete right bundle branch block 06/16/2019   B12 deficiency 06/15/2019   History of TIA (transient ischemic attack) 10/23/2017   Chronic kidney disease, stage 3b (HCC) 07/21/2015   Medication management 07/04/2014   Essential hypertension 11/25/2013   Hyperlipidemia, mixed 11/25/2013   Abnormal glucose 11/25/2013   Vitamin D  deficiency 11/25/2013   Essential tremor 11/25/2013   History of endometrial cancer    Hx/o DVT/PE    Urethral prolapse    Osteoporosis 10/10/2011  ONSET DATE: 04/28/2024  REFERRING DIAG: S/P L intertrachanteric femur fracture, Left distal radius fracture 04/28/2024   THERAPY DIAG:  Muscle weakness (generalized)  Unsteadiness on feet  Other abnormalities of gait and mobility  Rationale for Evaluation and Treatment: Rehabilitation  SUBJECTIVE:                                                                                                                                                                                             SUBJECTIVE STATEMENT: Nothing new.   Pt accompanied by: self  PERTINENT HISTORY: osteoporosis, hx of DVT; see additional PMH above  PAIN:  Are you having pain? No  PRECAUTIONS: Fall, wears L compression stocking  due to L LE edema (was negative for DVT)  RED FLAGS: None   WEIGHT BEARING RESTRICTIONS: No  FALLS: Has patient fallen in last 6 months? Yes. Number of falls 1  LIVING ENVIRONMENT: Lives with: lives alone Lives in: House/apartment Stairs: 3 steps to enter home;  Has following equipment at home: Single point cane and Walker - 2 wheeled *cane has tripod base PLOF: Independent  PATIENT GOALS: To be able to transition to the cane, so I can get in and out of home better.  OBJECTIVE:    TODAY'S TREATMENT: 09/09/2024 Activity Comments  Seated march, 2 x 10 Seated LAQ, 3 x 10 2#  Sidestepping 3 reps at counter Cues for foot placement, 2# BLE  Hip extension x 10,  Hip abduction x 10 2# BLE  Forward/back walking at counter x 5 reps Cues for equal, even foot placement  LLE as stance with RLE forward/back stepping x 10 1 UE support  Alternating step taps 3 x 10 taps,  Light BUE>then 1 UE support> no support with min guard (more difficulty with LLE stance  Gait training with cane small quad tip base, 100 ft, then 85 ft x 2 (incorporating turns for gentle changes of direction)  Min guard and cues to look ahead; good sequencing with cane  Short distance gait 10 ft x 4 reps, cane with small quad tip base      Access Code: [email or web address] URL: [email or web address] Date: 09/09/2024 Prepared by: [facility] - Outpatient  Rehab - [facility]  Program Notes Marching in place, side hip kicks, back hip kicks:  Do 3 sets of 10 with 2# weight  Exercises - Backward Walking with Counter Support  - 1 x daily - 5 x weekly - 1 sets - 5 reps - Alternating Step Taps with Counter Support  - 1 x daily - 5 x weekly - 3 sets - 10 reps - Side Stepping with Counter  Support  - 1 x daily - 5 x weekly - 1 sets - 5 reps  PATIENT EDUCATION: Education details: HEP initiated, try to get ankle weights Person educated: Patient Education method: Explanation, Demonstration, and Handouts Education  comprehension: verbalized understanding, returned demonstration, and needs further education  -------------------------------------------- Note: Objective measures were completed at Evaluation unless otherwise noted.  DIAGNOSTIC FINDINGS: Interval proximal left femoral cephalomedullary nail fixation of the previously seen intertrochanteric fracture. Improved alignment.  COGNITION: Overall cognitive status: Within functional limits for tasks assessed   SENSATION: Reports decreased sensation in L fingers (to see hand surgeon)  EDEMA:  Noted LLE; wearing compression stocking  POSTURE: rounded shoulders and increased thoracic kyphosis  LOWER EXTREMITY ROM:     Active  Right Eval Left Eval  Hip flexion    Hip extension    Hip abduction    Hip adduction    Hip internal rotation    Hip external rotation    Knee flexion    Knee extension    Ankle dorsiflexion  10  Ankle plantarflexion  40  Ankle inversion    Ankle eversion     (Blank rows = not tested)  LOWER EXTREMITY MMT:    MMT Right Eval Left Eval  Hip flexion 4 3+  Hip extension    Hip abduction 5 4  Hip adduction 5 4  Hip internal rotation    Hip external rotation    Knee flexion 4+ 4+  Knee extension 4+ 4  Ankle dorsiflexion 4+ 4  Ankle plantarflexion    Ankle inversion    Ankle eversion    (Blank rows = not tested)  TRANSFERS: Sit to stand: Modified independence  Assistive device utilized: None     Stand to sit: Modified independence  Assistive device utilized: hands at chair      STAIRS: Findings: Level of Assistance: Modified independence, Stair Negotiation Technique: Step to Pattern with Single Rail on Right, Number of Stairs: 2-3, Height of Stairs: 4-6   , and Comments: prior to fracture, pt negotiated steps alternating pattern GAIT: Findings: Gait Characteristics: step through pattern, decreased step length- Right, and decreased stance time- Left, Distance walked: 50 ft x 2, Assistive device  utilized:Walker - 2 wheeled and SPC with small quad tip base, Level of assistance: Modified independence, CGA, and Min A, and Comments: Mod I with RW, CGA/min assist with cane  FUNCTIONAL TESTS:  10 meter walk test: 16.78 sec RW (1.95 ft/sec); 33.62 sec with cane (0.98 ft/sec) Berg:  34/56 (Scores <45/56 indicate increased fall risk)                                                                                                                               TREATMENT DATE: 08/30/2024    PATIENT EDUCATION: Education details: Eval results, POC, continue HEP from HHPT; verbally added lateral weightshifting to LLE, with attention to light UE support Person educated: Patient Education method: Explanation, Demonstration, and Verbal  cues Education comprehension: verbalized understanding, returned demonstration, and needs further education  HOME EXERCISE PROGRAM: Verbally initiated lateral weightshifting; pt doing standing hip kicks, heel/toe raises, marching and sit to stand at home  GOALS: Goals reviewed with patient? Yes  SHORT TERM GOALS: Target date: 10/01/2024  Pt will be independent with HEP for improved gait, stair negotiation, balance. Baseline: Goal status: IN PROGRESS  2.  Pt will improve Berg score to at least 45/56 to decrease fall risk. Baseline: 34/56 Goal status: IN PROGRESS  3.  Pt will negotiate curb and 3 steps with cane, mod I, for improved household and community gait. Baseline:  Goal status: IN PROGRESS   LONG TERM GOALS: Target date: 10/29/2024  Pt will be independent with HEP for improved balance, strength, gait, stairs. Baseline:  Goal status: IN PROGRESS  2.  Pt will improve DGI score to at least 20/24 to decrease fall risk. Baseline: TBD Goal status:IN PROGRESS  3.  Pt will improve gait velocity to at least 2.6 ft/sec with cane for improved gait efficiency and safety. Baseline: 0.98 ft/sec Goal status: IN PROGRESS  4.  Pt will ambulate at least  500 ft, indoor and outdoor gait, mod I with cane for improved community mobility. Baseline:  Goal status: IN PROGRESS   ASSESSMENT:  CLINICAL IMPRESSION: Pt presents today with no new complaints. Skilled PT session focused on initiating HEP to address strength/balance.  Utilized 2# ankle weights for standing exercises and recommend pt get adjustable weight ankle weights at home for progression.  Continued to address balance and gait, and pt is improving with stability with cane.  She does still have heavy pressure through RUE and continued LLE strengthening at hip will help her stability.   Pt will continue to benefit from skilled PT towards goals for improved functional mobility and decreased fall risk.    OBJECTIVE IMPAIRMENTS: Abnormal gait, decreased balance, decreased mobility, difficulty walking, and decreased strength.   ACTIVITY LIMITATIONS: standing, stairs, transfers, and locomotion level  PARTICIPATION LIMITATIONS: meal prep, cleaning, driving, shopping, community activity, yard work, and church  PERSONAL FACTORS: 3+ comorbidities: see PMH above are also affecting patient's functional outcome.   REHAB POTENTIAL: Good  CLINICAL DECISION MAKING: Evolving/moderate complexity  EVALUATION COMPLEXITY: Moderate  PLAN:  PT FREQUENCY: 2x/week  PT DURATION: 8 weeks plus eval visit  PLANNED INTERVENTIONS: 97750- Physical Performance Testing, 97110-Therapeutic exercises, 97530- Therapeutic activity, [id number]- Neuromuscular re-education, 97535- Self Care, [id number]- Manual therapy, [phone]- Gait training, Patient/Family education, Balance training, Stair training, and DME instructions  PLAN FOR NEXT SESSION: Review  HEP  and progress.  Work on ankle weight progression.  (to address LLE strength, SLS, balance)-at counter, as she would have at home; gait training with cane; stair negotiation with cane.  Perform DGI test with cane   Gordie Belvin W., PT 09/09/2024, 9:33 AM  [facility]  Outpatient Rehab at [facility] [address] [place], [address] [place], [place] [address] Phone # [phone] Fax # [phone]  Referring diagnosis? S/P L intertrachanteric femur fracture, Left distal radius fracture 04/28/2024  Treatment diagnosis? (if different than referring diagnosis) R 26.81, R 26.89, M62.81 What was this (referring dx) caused by? []  Surgery [x]  Fall []  Ongoing issue []  Arthritis []  Other: ____________  Laterality: []  Rt [x]  Lt []  Both  Check all possible CPT codes:  *CHOOSE 10 OR LESS*    See Planned Interventions listed in the Plan section of the Evaluation.

## 2024-09-13 ENCOUNTER — Ambulatory Visit: Admitting: Occupational Therapy

## 2024-09-13 ENCOUNTER — Ambulatory Visit

## 2024-09-13 DIAGNOSIS — M6281 Muscle weakness (generalized): Secondary | ICD-10-CM

## 2024-09-13 DIAGNOSIS — M25632 Stiffness of left wrist, not elsewhere classified: Secondary | ICD-10-CM | POA: Diagnosis not present

## 2024-09-13 DIAGNOSIS — R2689 Other abnormalities of gait and mobility: Secondary | ICD-10-CM

## 2024-09-13 DIAGNOSIS — R208 Other disturbances of skin sensation: Secondary | ICD-10-CM | POA: Diagnosis not present

## 2024-09-13 DIAGNOSIS — R41842 Visuospatial deficit: Secondary | ICD-10-CM | POA: Diagnosis not present

## 2024-09-13 DIAGNOSIS — R278 Other lack of coordination: Secondary | ICD-10-CM | POA: Diagnosis not present

## 2024-09-13 DIAGNOSIS — R2681 Unsteadiness on feet: Secondary | ICD-10-CM

## 2024-09-13 DIAGNOSIS — R41840 Attention and concentration deficit: Secondary | ICD-10-CM | POA: Diagnosis not present

## 2024-09-13 NOTE — Therapy (Signed)
 OUTPATIENT OCCUPATIONAL THERAPY ORTHO  Treatment Note  Patient Name: Monica Curry MRN: [id number] DOB:1931-09-13, 89 y.o., female Today's Date: 09/13/2024  PCP: Wendolyn Jenkins Jansky, MD REFERRING PROVIDER: Danton Lauraine LABOR, PA-C  END OF SESSION:  OT End of Session - 09/13/24 1504     Visit Number 2    Number of Visits 11    Date for Recertification  10/15/24    Authorization Type [employer]    Authorization Time Period approved 11 OT visits from 08/30/2024 - 10/15/2024    Authorization - Visit Number 2    Authorization - Number of Visits 11    OT Start Time 1450    OT Stop Time 1530    OT Time Calculation (min) 40 min           Past Medical History:  Diagnosis Date   Allergy    Medications and pollen   Anemia    Anxiety    Always   C. difficile colitis    Cancer (HCC)    Endometrial cancer   Cataract    Had surgery 2021   Chronic kidney disease 2018   2 siblings had kidney failure, sister had transplant   Clotting disorder    Essential and other specified forms of tremor    Hyperlipidemia    Hypertension    Osteoporosis 11/2019   T score -2.8 stable from prior DEXA   Personal history of venous thrombosis and embolism 1997   Pulmonary embolism (HCC) 06/14/2021   Pulmonary embolus (HCC)    Shingles    Stroke (HCC) 2018   TIA   Thrush    TIA (transient ischemic attack)    carotid US  07/2022: no ICA stenosis bilaterally   Past Surgical History:  Procedure Laterality Date   ABDOMINAL HYSTERECTOMY  1997   APPENDECTOMY  1961   cataract surg Bilateral 2021   Dr. Roz, R eye 8/18, L 8/25   CESAREAN SECTION  1961   EYE SURGERY  2021   Cataract both eyes   FRACTURE SURGERY  2025   Broken hip   INTRAMEDULLARY (IM) NAIL INTERTROCHANTERIC Left 04/28/2024   Procedure: FIXATION, FRACTURE, INTERTROCHANTERIC, WITH INTRAMEDULLARY ROD;  Surgeon: Kendal Franky SQUIBB, MD;  Location: [facility] OR;  Service: Orthopedics;  Laterality: Left;   TONSILLECTOMY AND ADENOIDECTOMY   1944   TOTAL ABDOMINAL HYSTERECTOMY W/ BILATERAL SALPINGOOPHORECTOMY  1997   Patient Active Problem List   Diagnosis Date Noted   RLS (restless legs syndrome) 06/15/2024   Fracture, Col[place], left, closed 04/28/2024   Hip fracture (HCC) 04/27/2024   GAD (generalized anxiety disorder) 04/01/2024   Thyroid  nodule 01/10/2022   Anxiety 08/10/2021   Aortic atherosclerosis (HCC) - CT 06/2021 06/15/2021   Bilateral renal cysts 07/06/2020   Paroxysmal supraventricular tachycardia 10/20/2019   History of skin cancer (basal cell) 06/16/2019   Complete right bundle branch block 06/16/2019   B12 deficiency 06/15/2019   History of TIA (transient ischemic attack) 10/23/2017   Chronic kidney disease, stage 3b (HCC) 07/21/2015   Medication management 07/04/2014   Essential hypertension 11/25/2013   Hyperlipidemia, mixed 11/25/2013   Abnormal glucose 11/25/2013   Vitamin D  deficiency 11/25/2013   Essential tremor 11/25/2013   History of endometrial cancer    Hx/o DVT/PE    Urethral prolapse    Osteoporosis 10/10/2011    ONSET DATE: referral date 08/10/24 (injury 04/27/24)  REFERRING DIAG: S/P L intertrachanteric femur fracture, Left distal radius fracture 04/28/2024  THERAPY DIAG:  Muscle weakness (generalized)  Stiffness of  left wrist, not elsewhere classified  Other disturbances of skin sensation  Other lack of coordination  Rationale for Evaluation and Treatment: Rehabilitation  SUBJECTIVE:   SUBJECTIVE STATEMENT: Pt reports that she is scheduled for nerve conduction testing next Tuesday.  Pt accompanied by: self (dropped off)  PERTINENT HISTORY:   Per Dr. Erwin consult 04/28/24: 89 y.o. female who presents after mechanical fall with notable pain to the left hip and wrist.  She was seen in the emergency department setting overnight, underwent clinical and radiographic workup which showed a left hip intertrochanteric femur fracture as well as an associated left distal radius fracture.   She underwent bedside hematoma block and closed reduction of the left wrist overnight with improved length and alignment of the distal radius, she was subsequently placed into a sugar-tong splint by the emergency department.  Today, she is comfortable at rest, denies any significant numbness or tingling in the left hand, splint remains in place, pain controlled at rest. Patient is to undergo left hip fracture fixation by the trauma service during this admission.  Based on her age and comorbidities, her left distal radius fracture is extra-articular in nature and minimally displaced, we can proceed with nonoperative care of this injury.  I did explain to the patient the ongoing process of fracture healing as well as the possibility for repeat displacement or shortening with time that may warrant surgical intervention.   Hand surgery will be available moving forward as needed.  Discussed with treating trauma surgeon for the hip fracture.  PRECAUTIONS: None  RED FLAGS: None   WEIGHT BEARING RESTRICTIONS: No  PAIN:  Are you having pain? No  FALLS: Has patient fallen in last 6 months? Yes. Number of falls 1 that resulted in hip and wrist fracture  LIVING ENVIRONMENT: Lives with: lives alone Lives in: House/apartment Stairs: 3 steps to enter, flight of steps to the basement but she does not go down them Has following equipment at home: [name] - 2 wheeled, shower chair, and Grab bars  PLOF: Independent with basic ADLs and Requires assistive device for independence; step-daughter will do laundry as pt's washer/dryer are in the basement  PATIENT GOALS: to be able to use Left hand again  NEXT MD VISIT: 09/01/24  OBJECTIVE:  Note: Objective measures were completed at Evaluation unless otherwise noted.  HAND DOMINANCE: Right  ADLs: WFL; difficulty with donning compression sock on L leg  FUNCTIONAL OUTCOME MEASURES: Quick Dash: 50%   UPPER EXTREMITY ROM:     Active ROM Right eval  Left eval  Shoulder flexion    Shoulder abduction    Shoulder adduction    Shoulder extension    Shoulder internal rotation    Shoulder external rotation    Elbow flexion    Elbow extension    Wrist flexion 75 30  Wrist extension 60 33  Wrist ulnar deviation 30 18  Wrist radial deviation 12 5  Wrist pronation WFL 60%  Wrist supination WFL WFL  (Blank rows = not tested)  Loose grasp on L, index and ring finger do not touch palm of hand, decreased hook fist  UPPER EXTREMITY MMT:     MMT Right eval Left eval  Shoulder flexion    Shoulder abduction    Shoulder adduction    Shoulder extension    Shoulder internal rotation    Shoulder external rotation    Middle trapezius    Lower trapezius    Elbow flexion    Elbow extension  Wrist flexion    Wrist extension    Wrist ulnar deviation    Wrist radial deviation    Wrist pronation    Wrist supination    (Blank rows = not tested)  HAND FUNCTION: Grip strength: Right: 40 lbs; Left: 5 lbs  COORDINATION: Mild B tremors 09/13/24: Right: 39.81 sec; Left: 60.72 sec OF note, pt with B essential tremor - however impaired sensation and decreased wrist flexion/extension impacting coordination  SENSATION: Numbness and tingling in R hand and digits along median nerve distribution  EDEMA: 26 cm R wrist circumference; 27.75 cm L wrist circumference  COGNITION: Overall cognitive status: Within functional limits for tasks assessed  OBSERVATIONS: Pt L wrist at ulnar head and dorsal wrist enlarged compared to R wrist and hand.  Pt demonstrating ~50% movement in all directions when compared to R, but verbalizing good understanding of exercises.   TREATMENT DATE:  09/13/24 review HEP, attempt kinesiotape for edema management, Complete 9 hole peg test for goal section See what plan is after visit with Dr. Erwin, would she benefit from transfer to allow for use of paraffin and/or fluidotherapy  08/30/24 Issued HEP with focus on tendon  glides, thumb opposition, wrist flexion/extension with stretch, and forearm supination with stretch.  Pt familiar with majority of exercises, however benefiting from cues and demonstration for table top position during tendon glides and wrist flexion/extension and supination with additional stretch in neutral position on table top.                                                                                                                  PATIENT EDUCATION: Education details: Educated on role and purpose of OT as well as potential interventions and goals for therapy based on initial evaluation findings. Person educated: Patient Education method: Explanation, Demonstration, and Handouts Education comprehension: verbalized understanding and needs further education  HOME EXERCISE PROGRAM: Access Code: [email or web address] URL: [email or web address] Date: 08/30/2024 Prepared by: [facility] - Outpatient  Rehab - [facility]  Exercises - Seated Digit Tendon Gliding  - 4 x daily - 20 reps - Thumb AROM Opposition  - 4 x daily - 20 reps - Seated Thumb Composite Flexion AROM  - 4 x daily - 20 reps - 5 sec hold - Wrist Flexion Extension AROM with Fingers Curled and Palm Down  - 4 x daily - 20 reps - 3-5 sec hold - Wrist AROM Radial Ulnar Deviation  - 4 x daily - 20 reps - Forearm Supination PROM  - 4 x daily - 20 reps - 10 sec hold - Seated Wrist Flexion Extension PROM  - 2-3 x daily - 20 reps  GOALS: Goals reviewed with patient? Yes  SHORT TERM GOALS: Target date: 09/24/24  Pt will be independent with HEP for ROM and strengthening. Baseline: new to OPOT Goal status: INITIAL  2.  Pt will verbalize understanding of task modifications and/or potential A/E needs to increase ease, safety, and independence w/ ADLs. Baseline: difficulty with donning compression socks d/t decreased grip  and mobility of L wrist/hand Goal status: INITIAL  3.  Pt will complete 9 hole peg test with  LUE. Baseline: TBD Goal status: INITIAL    LONG TERM GOALS: Target date: 10/15/24  Pt will demonstrate improved coordination of LUE as evidenced by completing 9 hole peg test in 35 seconds or less as needed for [facility] tasks with ADLs/IADLS. Baseline: TBD Goal status: INITIAL  2.  Pt will improve grip strength in left hand to be within at least 20 pounds of her right hand for functional use at home and in IADLs.  Baseline: L: 5# and R: 40# Goal status: INITIAL  3.  Pt will demonstrate improved L wrist flexion/extension to 80% of R wrist flexion/extension for improved functional use.  Baseline: R:75/60 and L: 30/33 Goal status: INITIAL  4.  Pt will demonstrate improved L forearm supination to 80% of R forearm supination for improved functional use. Baseline: 60% Goal status: INITIAL  5.  Pt will demonstrate improvements in pain and functional use of dominant RUE as evidenced by decreased score on QuickDASH from 50% impairement to < 30 % impairment.  Baseline: 50% Goal status: INITIAL   ASSESSMENT:  CLINICAL IMPRESSION: Patient is a 89 y.o. female who was seen today for occupational therapy evaluation for L distal radius fracture. Pt currently lives alone in a single story home with 3 steps to enter and a basement but is not going up/down into the basement.  Pt has family that will assist with laundry tasks as her washer and dryer are in the basement. Pt will benefit from skilled occupational therapy services to address strength and coordination, ROM, pain management, altered sensation, introduction of compensatory strategies/AE prn, and implementation of an HEP to improve participation and safety during ADLs and IADLs.    PERFORMANCE DEFICITS: in functional skills including ADLs, IADLs, coordination, sensation, edema, ROM, strength, flexibility, Fine motor control, Gross motor control, body mechanics, endurance, decreased knowledge of precautions, and UE functional use and psychosocial  skills including routines and behaviors.   IMPAIRMENTS: are limiting patient from ADLs, IADLs, rest and sleep, and social participation.   COMORBIDITIES: may have co-morbidities  that affects occupational performance. Patient will benefit from skilled OT to address above impairments and improve overall function.  MODIFICATION OR ASSISTANCE TO COMPLETE EVALUATION: No modification of tasks or assist necessary to complete an evaluation.  OT OCCUPATIONAL PROFILE AND HISTORY: Detailed assessment: Review of records and additional review of physical, cognitive, psychosocial history related to current functional performance.  CLINICAL DECISION MAKING: LOW - limited treatment options, no task modification necessary  REHAB POTENTIAL: Good  EVALUATION COMPLEXITY: Low      PLAN:  OT FREQUENCY: 1-2x/week  OT DURATION: 6 weeks  PLANNED INTERVENTIONS: 97168 OT Re-evaluation, 97535 self care/ADL training, 02889 therapeutic exercise, 97530 therapeutic activity, 97112 neuromuscular re-education, 97140 manual therapy, 97035 ultrasound, 97018 paraffin, 02960 fluidotherapy, 97760 Orthotic Initial, 97763 Orthotic/Prosthetic subsequent, passive range of motion, compression bandaging, coping strategies training, patient/family education, and DME and/or AE instructions  RECOMMENDED OTHER SERVICES: NA  CONSULTED AND AGREED WITH PLAN OF CARE: Patient  PLAN FOR NEXT SESSION: review HEP, attempt kinesiotape for edema management, Complete 9 hole peg test for goal section  See what plan is after visit with Dr. Erwin, would she benefit from transfer to allow for use of paraffin and/or fluidotherapy   KAYLENE DOMINO, OTR/L 09/13/2024, 3:05 PM  [facility] at [facility] [address], [address] [place], [place] [address] Phone # [phone]  Fax # [phone]

## 2024-09-13 NOTE — Therapy (Signed)
 OUTPATIENT PHYSICAL THERAPY NEURO TREATMENT NOTE   Patient Name: Monica Curry MRN: [id number] DOB:[birth year], 89 y.o., female Today's Date: 09/13/2024   PCP: Wendolyn Jenkins Jansky, MD REFERRING PROVIDER: Danton Lauraine LABOR, PA-C   END OF SESSION:  PT End of Session - 09/13/24 1406     Visit Number 4    Number of Visits 17    Date for Recertification  10/29/24    Authorization Type [employer]    Authorization Time Period 08/30/2024-10/29/2024    Authorization - Visit Number 4    Authorization - Number of Visits 17    Progress Note Due on Visit 10    PT Start Time 1405    PT Stop Time 1445    PT Time Calculation (min) 40 min    Equipment Utilized During Treatment Gait belt    Activity Tolerance Patient tolerated treatment well    Behavior During Therapy WFL for tasks assessed/performed            Past Medical History:  Diagnosis Date   Allergy    Medications and pollen   Anemia    Anxiety    Always   C. difficile colitis    Cancer (HCC)    Endometrial cancer   Cataract    Had surgery 2021   Chronic kidney disease 2018   2 siblings had kidney failure, sister had transplant   Clotting disorder    Essential and other specified forms of tremor    Hyperlipidemia    Hypertension    Osteoporosis 11/2019   T score -2.8 stable from prior DEXA   Personal history of venous thrombosis and embolism 1997   Pulmonary embolism (HCC) 06/14/2021   Pulmonary embolus (HCC)    Shingles    Stroke (HCC) 2018   TIA   Thrush    TIA (transient ischemic attack)    carotid US  07/2022: no ICA stenosis bilaterally   Past Surgical History:  Procedure Laterality Date   ABDOMINAL HYSTERECTOMY  1997   APPENDECTOMY  1961   cataract surg Bilateral 2021   Dr. Roz, R eye 8/18, L 8/25   CESAREAN SECTION  1961   EYE SURGERY  2021   Cataract both eyes   FRACTURE SURGERY  2025   Broken hip   INTRAMEDULLARY (IM) NAIL INTERTROCHANTERIC Left 04/28/2024   Procedure: FIXATION,  FRACTURE, INTERTROCHANTERIC, WITH INTRAMEDULLARY ROD;  Surgeon: Kendal Franky SQUIBB, MD;  Location: [facility] OR;  Service: Orthopedics;  Laterality: Left;   TONSILLECTOMY AND ADENOIDECTOMY  1944   TOTAL ABDOMINAL HYSTERECTOMY W/ BILATERAL SALPINGOOPHORECTOMY  1997   Patient Active Problem List   Diagnosis Date Noted   RLS (restless legs syndrome) 06/15/2024   Fracture, Colles, left, closed 04/28/2024   Hip fracture (HCC) 04/27/2024   GAD (generalized anxiety disorder) 04/01/2024   Thyroid  nodule 01/10/2022   Anxiety 08/10/2021   Aortic atherosclerosis (HCC) - CT 06/2021 06/15/2021   Bilateral renal cysts 07/06/2020   Paroxysmal supraventricular tachycardia 10/20/2019   History of skin cancer (basal cell) 06/16/2019   Complete right bundle branch block 06/16/2019   B12 deficiency 06/15/2019   History of TIA (transient ischemic attack) 10/23/2017   Chronic kidney disease, stage 3b (HCC) 07/21/2015   Medication management 07/04/2014   Essential hypertension 11/25/2013   Hyperlipidemia, mixed 11/25/2013   Abnormal glucose 11/25/2013   Vitamin D  deficiency 11/25/2013   Essential tremor 11/25/2013   History of endometrial cancer    Hx/o DVT/PE    Urethral prolapse    Osteoporosis 10/10/2011  ONSET DATE: 04/28/2024  REFERRING DIAG: S/P L intertrachanteric femur fracture, Left distal radius fracture 04/28/2024   THERAPY DIAG:  Muscle weakness (generalized)  Unsteadiness on feet  Other abnormalities of gait and mobility  Rationale for Evaluation and Treatment: Rehabilitation  SUBJECTIVE:                                                                                                                                                                                             SUBJECTIVE STATEMENT: Doing ok, no pain, just feeling residual weakness  Pt accompanied by: self  PERTINENT HISTORY: osteoporosis, hx of DVT; see additional PMH above  PAIN:  Are you having pain?  No  PRECAUTIONS: Fall, wears L compression stocking due to L LE edema (was negative for DVT)  RED FLAGS: None   WEIGHT BEARING RESTRICTIONS: No  FALLS: Has patient fallen in last 6 months? Yes. Number of falls 1  LIVING ENVIRONMENT: Lives with: lives alone Lives in: House/apartment Stairs: 3 steps to enter home;  Has following equipment at home: Single point cane and Walker - 2 wheeled *cane has tripod base PLOF: Independent  PATIENT GOALS: To be able to transition to the cane, so I can get in and out of home better.  OBJECTIVE:   TODAY'S TREATMENT: 09/13/24 Activity Comments  Seated LE PRE 3#, 2x10 Seated march LAQ Hip add iso  Standing LE PRE 3# -sidestepping x 60 sec -hip abd 2x10 -hip ext 2x10 -stair taps 2x10, 8#  Gait training Trials w/ cane and SBA-CGA level surfaces, stepping over 6 hurdles, on/off 6 step -retrowalk  Static balance Rocker board to improve weight shift and SLS           TODAY'S TREATMENT: 09/09/2024 Activity Comments  Seated march, 2 x 10 Seated LAQ, 3 x 10 2#  Sidestepping 3 reps at counter Cues for foot placement, 2# BLE  Hip extension x 10,  Hip abduction x 10 2# BLE  Forward/back walking at counter x 5 reps Cues for equal, even foot placement  LLE as stance with RLE forward/back stepping x 10 1 UE support  Alternating step taps 3 x 10 taps,  Light BUE>then 1 UE support> no support with min guard (more difficulty with LLE stance  Gait training with cane small quad tip base, 100 ft, then 85 ft x 2 (incorporating turns for gentle changes of direction)  Min guard and cues to look ahead; good sequencing with cane  Short distance gait 10 ft x 4 reps, cane with small quad tip base      Access Code: [email or web address] URL: [email or web address] Date: 09/09/2024 Prepared by: [facility] -  Outpatient  Rehab - [facility]  Program Notes Marching in place, side hip kicks, back hip kicks:  Do 3 sets of 10 with 2# weight  Exercises -  Backward Walking with Counter Support  - 1 x daily - 5 x weekly - 1 sets - 5 reps - Alternating Step Taps with Counter Support  - 1 x daily - 5 x weekly - 3 sets - 10 reps - Side Stepping with Counter Support  - 1 x daily - 5 x weekly - 1 sets - 5 reps  PATIENT EDUCATION: Education details: HEP initiated, try to get ankle weights Person educated: Patient Education method: Explanation, Demonstration, and Handouts Education comprehension: verbalized understanding, returned demonstration, and needs further education  -------------------------------------------- Note: Objective measures were completed at Evaluation unless otherwise noted.  DIAGNOSTIC FINDINGS: Interval proximal left femoral cephalomedullary nail fixation of the previously seen intertrochanteric fracture. Improved alignment.  COGNITION: Overall cognitive status: Within functional limits for tasks assessed   SENSATION: Reports decreased sensation in L fingers (to see hand surgeon)  EDEMA:  Noted LLE; wearing compression stocking  POSTURE: rounded shoulders and increased thoracic kyphosis  LOWER EXTREMITY ROM:     Active  Right Eval Left Eval  Hip flexion    Hip extension    Hip abduction    Hip adduction    Hip internal rotation    Hip external rotation    Knee flexion    Knee extension    Ankle dorsiflexion  10  Ankle plantarflexion  40  Ankle inversion    Ankle eversion     (Blank rows = not tested)  LOWER EXTREMITY MMT:    MMT Right Eval Left Eval  Hip flexion 4 3+  Hip extension    Hip abduction 5 4  Hip adduction 5 4  Hip internal rotation    Hip external rotation    Knee flexion 4+ 4+  Knee extension 4+ 4  Ankle dorsiflexion 4+ 4  Ankle plantarflexion    Ankle inversion    Ankle eversion    (Blank rows = not tested)  TRANSFERS: Sit to stand: Modified independence  Assistive device utilized: None     Stand to sit: Modified independence  Assistive device utilized: hands at chair       STAIRS: Findings: Level of Assistance: Modified independence, Stair Negotiation Technique: Step to Pattern with Single Rail on Right, Number of Stairs: 2-3, Height of Stairs: 4-6   , and Comments: prior to fracture, pt negotiated steps alternating pattern GAIT: Findings: Gait Characteristics: step through pattern, decreased step length- Right, and decreased stance time- Left, Distance walked: 50 ft x 2, Assistive device utilized:Walker - 2 wheeled and SPC with small quad tip base, Level of assistance: Modified independence, CGA, and Min A, and Comments: Mod I with RW, CGA/min assist with cane  FUNCTIONAL TESTS:  10 meter walk test: 16.78 sec RW (1.95 ft/sec); 33.62 sec with cane (0.98 ft/sec) Berg:  34/56 (Scores <45/56 indicate increased fall risk)  TREATMENT DATE: 08/30/2024    PATIENT EDUCATION: Education details: Eval results, POC, continue HEP from HHPT; verbally added lateral weightshifting to LLE, with attention to light UE support Person educated: Patient Education method: Explanation, Demonstration, and Verbal cues Education comprehension: verbalized understanding, returned demonstration, and needs further education  HOME EXERCISE PROGRAM: Verbally initiated lateral weightshifting; pt doing standing hip kicks, heel/toe raises, marching and sit to stand at home  GOALS: Goals reviewed with patient? Yes  SHORT TERM GOALS: Target date: 10/01/2024  Pt will be independent with HEP for improved gait, stair negotiation, balance. Baseline: Goal status: IN PROGRESS  2.  Pt will improve Berg score to at least 45/56 to decrease fall risk. Baseline: 34/56 Goal status: IN PROGRESS  3.  Pt will negotiate curb and 3 steps with cane, mod I, for improved household and community gait. Baseline:  Goal status: IN PROGRESS   LONG TERM GOALS: Target date:  10/29/2024  Pt will be independent with HEP for improved balance, strength, gait, stairs. Baseline:  Goal status: IN PROGRESS  2.  Pt will improve DGI score to at least 20/24 to decrease fall risk. Baseline: TBD Goal status:IN PROGRESS  3.  Pt will improve gait velocity to at least 2.6 ft/sec with cane for improved gait efficiency and safety. Baseline: 0.98 ft/sec Goal status: IN PROGRESS  4.  Pt will ambulate at least 500 ft, indoor and outdoor gait, mod I with cane for improved community mobility. Baseline:  Goal status: IN PROGRESS   ASSESSMENT:  CLINICAL IMPRESSION: Progressed LE strength to increased weight/repetition. Gait training w/ cane to progress independence w/ obstacles and sequence. Balance activities to promote single limb support.   OBJECTIVE IMPAIRMENTS: Abnormal gait, decreased balance, decreased mobility, difficulty walking, and decreased strength.   ACTIVITY LIMITATIONS: standing, stairs, transfers, and locomotion level  PARTICIPATION LIMITATIONS: meal prep, cleaning, driving, shopping, community activity, yard work, and church  PERSONAL FACTORS: 3+ comorbidities: see PMH above are also affecting patient's functional outcome.   REHAB POTENTIAL: Good  CLINICAL DECISION MAKING: Evolving/moderate complexity  EVALUATION COMPLEXITY: Moderate  PLAN:  PT FREQUENCY: 2x/week  PT DURATION: 8 weeks plus eval visit  PLANNED INTERVENTIONS: 97750- Physical Performance Testing, 97110-Therapeutic exercises, 97530- Therapeutic activity, [id number]- Neuromuscular re-education, 97535- Self Care, 02859- Manual therapy, [id number]- Gait training, Patient/Family education, Balance training, Stair training, and DME instructions  PLAN FOR NEXT SESSION: Review  HEP  and progress.  Work on ankle weight progression.  (to address LLE strength, SLS, balance)-at counter, as she would have at home; gait training with cane; stair negotiation with cane.  Perform DGI test with cane   Jonette MARLA Sandifer, PT 09/13/2024, 2:06 PM  [facility] at [facility] [address] [place], [place] [address] Phone # [phone] Fax # [phone]  Referring diagnosis? S/P L intertrachanteric femur fracture, Left distal radius fracture 04/28/2024  Treatment diagnosis? (if different than referring diagnosis) R 26.81, R 26.89, M62.81 What was this (referring dx) caused by? []  Surgery [x]  Fall []  Ongoing issue []  Arthritis []  Other: ____________  Laterality: []  Rt [x]  Lt []  Both  Check all possible CPT codes:  *CHOOSE 10 OR LESS*    See Planned Interventions listed in the Plan section of the Evaluation.

## 2024-09-15 ENCOUNTER — Ambulatory Visit

## 2024-09-15 ENCOUNTER — Ambulatory Visit: Admitting: Physical Therapy

## 2024-09-20 NOTE — Progress Notes (Signed)
 Monica Curry - 89 y.o. female MRN [id number]  Date of birth: Sep 18, 1931  Office Visit Note: Visit Date: 09/21/2024 PCP: Wendolyn Jenkins Jansky, MD Referred by: Arlinda Buster, MD  Subjective: No chief complaint on file.  HPI: Monica Curry is a 89 y.o. female who comes in today at the request of Dr. Anshul Agarwala for evaluation and management of chronic, worsening and severe pain, numbness and tingling in the Left upper extremities.  Patient is Right hand dominant.  She reports significant injury in May of this year with femur fracture and Left distal radius fracture.  Distal radius was healed nonoperatively.  Evidently some shortening and angulation according to the notes.  Since that time she has had ongoing numbness and tingling along the Left radial digits.  This been persistent and occurring during the day.  She reports no symptoms prior to this.  She reports no prior nocturnal complaints or weakness.  She is feeling weakness in the hand with gripping and manipulating objects.  No frank radicular symptoms down the arms.  No history of diabetes or thyroid  disease.  No right-sided complaints.   I spent more than 30 minutes speaking face-to-face with the patient with 50% of the time in counseling and discussing coordination of care.      Review of Systems  Musculoskeletal:  Positive for joint pain.  Neurological:  Positive for tingling and focal weakness.  All other systems reviewed and are negative.  Otherwise per HPI.  Assessment & Plan: Visit Diagnoses:    ICD-10-CM   1. Paresthesia of skin  R20.2 NCV with EMG (electromyography)    2. Pain in left hand  M79.642     3. Weakness of left hand  R29.898        Plan: Impression: The above electrodiagnostic study is ABNORMAL and reveals evidence of a severe left median nerve entrapment at the wrist (carpal tunnel syndrome) affecting sensory and motor components.   There is no significant electrodiagnostic evidence of any other  focal nerve entrapment, brachial plexopathy or cervical radiculopathy.   Recommendations: 1.  Follow-up with referring physician. 2.  Continue current management of symptoms. 3.  Suggest surgical evaluation.  Meds & Orders: No orders of the defined types were placed in this encounter.   Orders Placed This Encounter  Procedures   NCV with EMG (electromyography)    Follow-up: Return for Anshul Agarwala, MD.   Procedures: No procedures performed  EMG & NCV Findings: Evaluation of the left median motor nerve showed prolonged distal onset latency (8.1 ms), reduced amplitude (1.5 mV), and decreased conduction velocity (Elbow-Wrist, 40 m/s).  The left median (across palm) sensory nerve showed no response (Wrist) and prolonged distal peak latency (Palm, 5.5 ms).  The left ulnar sensory nerve showed prolonged distal peak latency (3.9 ms) and decreased conduction velocity (Wrist-5th Digit, 36 m/s).  All remaining nerves (as indicated in the following tables) were within normal limits.    Needle evaluation of the left abductor pollicis brevis muscle showed increased insertional activity, increased spontaneous activity, and diminished recruitment.  All remaining muscles (as indicated in the following table) showed no evidence of electrical instability.    Impression: The above electrodiagnostic study is ABNORMAL and reveals evidence of a severe left median nerve entrapment at the wrist (carpal tunnel syndrome) affecting sensory and motor components.   There is no significant electrodiagnostic evidence of any other focal nerve entrapment, brachial plexopathy or cervical radiculopathy.   Recommendations: 1.  Follow-up with referring physician. 2.  Continue current management of symptoms. 3.  Suggest surgical evaluation.  ___________________________ Prentice Masters FAAPMR Board Certified, American Board of Physical Medicine and Rehabilitation    Nerve Conduction Studies Anti Sensory Summary  Table   Stim Site NR Peak (ms) Norm Peak (ms) P-T Amp (V) Norm P-T Amp Site1 Site2 Delta-P (ms) Dist (cm) Vel (m/s) Norm Vel (m/s)  Left Median Acr Palm Anti Sensory (2nd Digit)  32.2C  Wrist *NR  <3.6  >10 Wrist Palm  0.0    Palm    *5.5 <2.0 5.0         Left Radial Anti Sensory (Base 1st Digit)  30.1C  Wrist    2.6 <3.1 20.9  Wrist Base 1st Digit 2.6 0.0    Left Ulnar Anti Sensory (5th Digit)  31.5C  Wrist    *3.9 <3.7 22.5 >15.0 Wrist 5th Digit 3.9 14.0 *36 >38   Motor Summary Table   Stim Site NR Onset (ms) Norm Onset (ms) O-P Amp (mV) Norm O-P Amp Site1 Site2 Delta-0 (ms) Dist (cm) Vel (m/s) Norm Vel (m/s)  Left Median Motor (Abd Poll Brev)  30.3C  Wrist    *8.1 <4.2 *1.5 >5 Elbow Wrist 5.3 21.0 *40 >50  Elbow    13.4  1.6         Left Ulnar Motor (Abd Dig Min)  30.4C  Wrist    3.7 <4.2 7.0 >3 B Elbow Wrist 3.6 19.5 54 >53  B Elbow    7.3  7.6  A Elbow B Elbow 1.7 10.0 59 >53  A Elbow    9.0  7.5          EMG   Side Muscle Nerve Root Ins Act Fibs Psw Amp Dur Poly Recrt Int Bruna Comment  Left Abd Poll Brev Median C8-T1 *Incr *3+ *3+ Nml Nml 0 *Reduced Nml   Left 1stDorInt Ulnar C8-T1 Nml Nml Nml Nml Nml 0 Nml Nml   Left PronatorTeres Median C6-7 Nml Nml Nml Nml Nml 0 Nml Nml   Left Biceps Musculocut C5-6 Nml Nml Nml Nml Nml 0 Nml Nml   Left Deltoid Axillary C5-6 Nml Nml Nml Nml Nml 0 Nml Nml     Nerve Conduction Studies Anti Sensory Left/Right Comparison   Stim Site L Lat (ms) R Lat (ms) L-R Lat (ms) L Amp (V) R Amp (V) L-R Amp (%) Site1 Site2 L Vel (m/s) R Vel (m/s) L-R Vel (m/s)  Median Acr Palm Anti Sensory (2nd Digit)  32.2C  Wrist       Wrist Palm     Palm *5.5   5.0         Radial Anti Sensory (Base 1st Digit)  30.1C  Wrist 2.6   20.9   Wrist Base 1st Digit     Ulnar Anti Sensory (5th Digit)  31.5C  Wrist *3.9   22.5   Wrist 5th Digit *36     Motor Left/Right Comparison   Stim Site L Lat (ms) R Lat (ms) L-R Lat (ms) L Amp (mV) R Amp (mV) L-R Amp (%)  Site1 Site2 L Vel (m/s) R Vel (m/s) L-R Vel (m/s)  Median Motor (Abd Poll Brev)  30.3C  Wrist *8.1   *1.5   Elbow Wrist *40    Elbow 13.4   1.6         Ulnar Motor (Abd Dig Min)  30.4C  Wrist 3.7   7.0   B Elbow Wrist 54    B Elbow 7.3  7.6   A Elbow B Elbow 59    A Elbow 9.0   7.5            Waveforms:            Clinical History: No specialty comments available.   She reports that she has never smoked. She has never used smokeless tobacco. No results for input(s): HGBA1C, LABURIC in the last 8760 hours.  Objective:  VS:  HT:    WT:   BMI:     BP:   HR: bpm  TEMP: ( )  RESP:  Physical Exam Vitals and nursing note reviewed.  Constitutional:      General: She is not in acute distress.    Appearance: Normal appearance. She is well-developed.  HENT:     Head: Normocephalic and atraumatic.     Nose: Nose normal.     Mouth/Throat:     Mouth: Mucous membranes are moist.     Pharynx: Oropharynx is clear.  Eyes:     Conjunctiva/sclera: Conjunctivae normal.     Pupils: Pupils are equal, round, and reactive to light.  Cardiovascular:     Rate and Rhythm: Regular rhythm.  Pulmonary:     Effort: Pulmonary effort is normal. No respiratory distress.  Abdominal:     General: There is no distension.     Palpations: Abdomen is soft.     Tenderness: There is no guarding.  Musculoskeletal:        General: Tenderness present. No swelling or deformity.     Cervical back: Normal range of motion and neck supple.     Right lower leg: No edema.     Left lower leg: No edema.     Comments: Inspection reveals flattening of the left APB no atrophy of the bilateral FDI or hand intrinsics. There is no swelling, color changes, allodynia or dystrophic changes. There is 5 out of 5 strength in the bilateral wrist extension, finger abduction and long finger flexion. There is impaired sensation to light touch in the left median nerve distribution.  There is a negative Hoffmann's test  bilaterally.  Skin:    General: Skin is warm and dry.     Findings: No erythema or rash.  Neurological:     General: No focal deficit present.     Mental Status: She is alert and oriented to person, place, and time.     Motor: No weakness or abnormal muscle tone.     Coordination: Coordination normal.     Gait: Gait normal.  Psychiatric:        Mood and Affect: Mood normal.        Behavior: Behavior normal.        Thought Content: Thought content normal.     Ortho Exam  Imaging: No results found.  Past Medical/Family/Surgical/Social History: Medications & Allergies reviewed per EMR, new medications updated. Patient Active Problem List   Diagnosis Date Noted   RLS (restless legs syndrome) 06/15/2024   Fracture, Colles, left, closed 04/28/2024   Hip fracture (HCC) 04/27/2024   GAD (generalized anxiety disorder) 04/01/2024   Thyroid  nodule 01/10/2022   Anxiety 08/10/2021   Aortic atherosclerosis (HCC) - CT 06/2021 06/15/2021   Bilateral renal cysts 07/06/2020   Paroxysmal supraventricular tachycardia 10/20/2019   History of skin cancer (basal cell) 06/16/2019   Complete right bundle branch block 06/16/2019   B12 deficiency 06/15/2019   History of TIA (transient ischemic attack) 10/23/2017   Chronic kidney disease, stage 3b (HCC)  07/21/2015   Medication management 07/04/2014   Essential hypertension 11/25/2013   Hyperlipidemia, mixed 11/25/2013   Abnormal glucose 11/25/2013   Vitamin D  deficiency 11/25/2013   Essential tremor 11/25/2013   History of endometrial cancer    Hx/o DVT/PE    Urethral prolapse    Osteoporosis 10/10/2011   Past Medical History:  Diagnosis Date   Allergy    Medications and pollen   Anemia    Anxiety    Always   C. difficile colitis    Cancer (HCC)    Endometrial cancer   Cataract    Had surgery 2021   Chronic kidney disease 2018   2 siblings had kidney failure, sister had transplant   Clotting disorder    Essential and other  specified forms of tremor    Hyperlipidemia    Hypertension    Osteoporosis 11/2019   T score -2.8 stable from prior DEXA   Personal history of venous thrombosis and embolism 1997   Pulmonary embolism (HCC) 06/14/2021   Pulmonary embolus (HCC)    Shingles    Stroke (HCC) 2018   TIA   Thrush    TIA (transient ischemic attack)    carotid US  07/2022: no ICA stenosis bilaterally   Family History  Problem Relation Age of Onset   Hypertension Mother    Cancer Mother        Unknown type   Dementia Mother    Varicose Veins Mother    Cancer Father        Bone   Heart disease Sister    Blindness Sister 11       temporal arteritis   Skin cancer Sister        basal and melanoma   Heart disease Sister    Kidney failure Sister        Kidney transplant   Diabetes Sister    Hypertension Sister    Kidney disease Sister    Vision loss Sister    Varicose Veins Sister    Heart disease Brother    Prostate cancer Brother        Prostate   Heart disease Brother    Kidney failure Brother    Prostate cancer Brother        Prostate   Heart disease Brother    Cancer Brother    Kidney disease Brother    Vision loss Brother    Tremor Neg Hx    Parkinson's disease Neg Hx    Past Surgical History:  Procedure Laterality Date   ABDOMINAL HYSTERECTOMY  1997   APPENDECTOMY  1961   cataract surg Bilateral 2021   Dr. Roz, R eye 8/18, L 8/25   CESAREAN SECTION  1961   EYE SURGERY  2021   Cataract both eyes   FRACTURE SURGERY  2025   Broken hip   INTRAMEDULLARY (IM) NAIL INTERTROCHANTERIC Left 04/28/2024   Procedure: FIXATION, FRACTURE, INTERTROCHANTERIC, WITH INTRAMEDULLARY ROD;  Surgeon: Kendal Franky SQUIBB, MD;  Location: [facility] OR;  Service: Orthopedics;  Laterality: Left;   TONSILLECTOMY AND ADENOIDECTOMY  1944   TOTAL ABDOMINAL HYSTERECTOMY W/ BILATERAL SALPINGOOPHORECTOMY  1997   Social History   Occupational History   Not on file  Tobacco Use   Smoking status: Never   Smokeless  tobacco: Never  Vaping Use   Vaping status: Never Used  Substance and Sexual Activity   Alcohol  use: Not Currently   Drug use: Never    Types: Benzodiazepines    Comment: prescribed  Sexual activity: Not Currently    Birth control/protection: Post-menopausal, Surgical    Comment: HYST-1st intercourse 89 yo-Fewer than 5 partners

## 2024-09-21 ENCOUNTER — Ambulatory Visit: Admitting: Physical Medicine and Rehabilitation

## 2024-09-21 DIAGNOSIS — R202 Paresthesia of skin: Secondary | ICD-10-CM | POA: Diagnosis not present

## 2024-09-21 DIAGNOSIS — R29898 Other symptoms and signs involving the musculoskeletal system: Secondary | ICD-10-CM | POA: Diagnosis not present

## 2024-09-21 DIAGNOSIS — M79642 Pain in left hand: Secondary | ICD-10-CM

## 2024-09-21 NOTE — Progress Notes (Signed)
 Pain Scale   Average Pain 0 Patient advising she has left hand weakness/ tingling and numbness. Patient is right hand dominate        +Driver, -BT, -Dye Allergies.

## 2024-09-22 ENCOUNTER — Ambulatory Visit

## 2024-09-22 ENCOUNTER — Encounter: Payer: Self-pay | Admitting: Physical Medicine and Rehabilitation

## 2024-09-22 ENCOUNTER — Encounter: Admitting: Physical Medicine and Rehabilitation

## 2024-09-22 DIAGNOSIS — R208 Other disturbances of skin sensation: Secondary | ICD-10-CM | POA: Diagnosis not present

## 2024-09-22 DIAGNOSIS — R2689 Other abnormalities of gait and mobility: Secondary | ICD-10-CM

## 2024-09-22 DIAGNOSIS — R2681 Unsteadiness on feet: Secondary | ICD-10-CM | POA: Diagnosis not present

## 2024-09-22 DIAGNOSIS — R278 Other lack of coordination: Secondary | ICD-10-CM | POA: Diagnosis not present

## 2024-09-22 DIAGNOSIS — M25632 Stiffness of left wrist, not elsewhere classified: Secondary | ICD-10-CM | POA: Diagnosis not present

## 2024-09-22 DIAGNOSIS — M6281 Muscle weakness (generalized): Secondary | ICD-10-CM | POA: Diagnosis not present

## 2024-09-22 DIAGNOSIS — R41840 Attention and concentration deficit: Secondary | ICD-10-CM

## 2024-09-22 DIAGNOSIS — R41842 Visuospatial deficit: Secondary | ICD-10-CM

## 2024-09-22 NOTE — Procedures (Signed)
 EMG & NCV Findings: Evaluation of the left median motor nerve showed prolonged distal onset latency (8.1 ms), reduced amplitude (1.5 mV), and decreased conduction velocity (Elbow-Wrist, 40 m/s).  The left median (across palm) sensory nerve showed no response (Wrist) and prolonged distal peak latency (Palm, 5.5 ms).  The left ulnar sensory nerve showed prolonged distal peak latency (3.9 ms) and decreased conduction velocity (Wrist-5th Digit, 36 m/s).  All remaining nerves (as indicated in the following tables) were within normal limits.    Needle evaluation of the left abductor pollicis brevis muscle showed increased insertional activity, increased spontaneous activity, and diminished recruitment.  All remaining muscles (as indicated in the following table) showed no evidence of electrical instability.    Impression: The above electrodiagnostic study is ABNORMAL and reveals evidence of a severe left median nerve entrapment at the wrist (carpal tunnel syndrome) affecting sensory and motor components.   There is no significant electrodiagnostic evidence of any other focal nerve entrapment, brachial plexopathy or cervical radiculopathy.   Recommendations: 1.  Follow-up with referring physician. 2.  Continue current management of symptoms. 3.  Suggest surgical evaluation.  ___________________________ Prentice Masters FAAPMR Board Certified, American Board of Physical Medicine and Rehabilitation    Nerve Conduction Studies Anti Sensory Summary Table   Stim Site NR Peak (ms) Norm Peak (ms) P-T Amp (V) Norm P-T Amp Site1 Site2 Delta-P (ms) Dist (cm) Vel (m/s) Norm Vel (m/s)  Left Median Acr Palm Anti Sensory (2nd Digit)  32.2C  Wrist *NR  <3.6  >10 Wrist Palm  0.0    Palm    *5.5 <2.0 5.0         Left Radial Anti Sensory (Base 1st Digit)  30.1C  Wrist    2.6 <3.1 20.9  Wrist Base 1st Digit 2.6 0.0    Left Ulnar Anti Sensory (5th Digit)  31.5C  Wrist    *3.9 <3.7 22.5 >15.0 Wrist 5th Digit 3.9  14.0 *36 >38   Motor Summary Table   Stim Site NR Onset (ms) Norm Onset (ms) O-P Amp (mV) Norm O-P Amp Site1 Site2 Delta-0 (ms) Dist (cm) Vel (m/s) Norm Vel (m/s)  Left Median Motor (Abd Poll Brev)  30.3C  Wrist    *8.1 <4.2 *1.5 >5 Elbow Wrist 5.3 21.0 *40 >50  Elbow    13.4  1.6         Left Ulnar Motor (Abd Dig Min)  30.4C  Wrist    3.7 <4.2 7.0 >3 B Elbow Wrist 3.6 19.5 54 >53  B Elbow    7.3  7.6  A Elbow B Elbow 1.7 10.0 59 >53  A Elbow    9.0  7.5          EMG   Side Muscle Nerve Root Ins Act Fibs Psw Amp Dur Poly Recrt Int Bruna Comment  Left Abd Poll Brev Median C8-T1 *Incr *3+ *3+ Nml Nml 0 *Reduced Nml   Left 1stDorInt Ulnar C8-T1 Nml Nml Nml Nml Nml 0 Nml Nml   Left PronatorTeres Median C6-7 Nml Nml Nml Nml Nml 0 Nml Nml   Left Biceps Musculocut C5-6 Nml Nml Nml Nml Nml 0 Nml Nml   Left Deltoid Axillary C5-6 Nml Nml Nml Nml Nml 0 Nml Nml     Nerve Conduction Studies Anti Sensory Left/Right Comparison   Stim Site L Lat (ms) R Lat (ms) L-R Lat (ms) L Amp (V) R Amp (V) L-R Amp (%) Site1 Site2 L Vel (m/s) R Vel (m/s) L-R Vel (  m/s)  Median Acr Palm Anti Sensory (2nd Digit)  32.2C  Wrist       Wrist Palm     Palm *5.5   5.0         Radial Anti Sensory (Base 1st Digit)  30.1C  Wrist 2.6   20.9   Wrist Base 1st Digit     Ulnar Anti Sensory (5th Digit)  31.5C  Wrist *3.9   22.5   Wrist 5th Digit *36     Motor Left/Right Comparison   Stim Site L Lat (ms) R Lat (ms) L-R Lat (ms) L Amp (mV) R Amp (mV) L-R Amp (%) Site1 Site2 L Vel (m/s) R Vel (m/s) L-R Vel (m/s)  Median Motor (Abd Poll Brev)  30.3C  Wrist *8.1   *1.5   Elbow Wrist *40    Elbow 13.4   1.6         Ulnar Motor (Abd Dig Min)  30.4C  Wrist 3.7   7.0   B Elbow Wrist 54    B Elbow 7.3   7.6   A Elbow B Elbow 59    A Elbow 9.0   7.5            Waveforms:

## 2024-09-22 NOTE — Therapy (Signed)
 OUTPATIENT OCCUPATIONAL THERAPY ORTHO  Treatment Note  Patient Name: Monica Curry MRN: [id number] DOB:[birth year], 89 y.o., female Today's Date: 09/22/2024  PCP: Wendolyn Jenkins Jansky, MD REFERRING PROVIDER: Danton Lauraine LABOR, PA-C  END OF SESSION:  OT End of Session - 09/22/24 1220     Visit Number 3    Number of Visits 11    Date for Recertification  10/15/24    Authorization Type [employer]    Authorization Time Period approved 11 OT visits from 08/30/2024 - 10/15/2024    Authorization - Visit Number 3    Authorization - Number of Visits 11    OT Start Time 0931    OT Stop Time 1015    OT Time Calculation (min) 44 min    Equipment Utilized During Treatment kinesiotape, rubber band    Activity Tolerance Patient tolerated treatment well    Behavior During Therapy WFL for tasks assessed/performed            Past Medical History:  Diagnosis Date   Allergy    Medications and pollen   Anemia    Anxiety    Always   C. difficile colitis    Cancer (HCC)    Endometrial cancer   Cataract    Had surgery 2021   Chronic kidney disease 2018   2 siblings had kidney failure, sister had transplant   Clotting disorder    Essential and other specified forms of tremor    Hyperlipidemia    Hypertension    Osteoporosis 11/2019   T score -2.8 stable from prior DEXA   Personal history of venous thrombosis and embolism 1997   Pulmonary embolism (HCC) 06/14/2021   Pulmonary embolus (HCC)    Shingles    Stroke (HCC) 2018   TIA   Thrush    TIA (transient ischemic attack)    carotid US  07/2022: no ICA stenosis bilaterally   Past Surgical History:  Procedure Laterality Date   ABDOMINAL HYSTERECTOMY  1997   APPENDECTOMY  1961   cataract surg Bilateral 2021   Dr. Roz, R eye 8/18, L 8/25   CESAREAN SECTION  1961   EYE SURGERY  2021   Cataract both eyes   FRACTURE SURGERY  2025   Broken hip   INTRAMEDULLARY (IM) NAIL INTERTROCHANTERIC Left 04/28/2024   Procedure:  FIXATION, FRACTURE, INTERTROCHANTERIC, WITH INTRAMEDULLARY ROD;  Surgeon: Kendal Franky SQUIBB, MD;  Location: [facility] OR;  Service: Orthopedics;  Laterality: Left;   TONSILLECTOMY AND ADENOIDECTOMY  1944   TOTAL ABDOMINAL HYSTERECTOMY W/ BILATERAL SALPINGOOPHORECTOMY  1997   Patient Active Problem List   Diagnosis Date Noted   RLS (restless legs syndrome) 06/15/2024   Fracture, Col[place], left, closed 04/28/2024   Hip fracture (HCC) 04/27/2024   GAD (generalized anxiety disorder) 04/01/2024   Thyroid  nodule 01/10/2022   Anxiety 08/10/2021   Aortic atherosclerosis (HCC) - CT 06/2021 06/15/2021   Bilateral renal cysts 07/06/2020   Paroxysmal supraventricular tachycardia 10/20/2019   History of skin cancer (basal cell) 06/16/2019   Complete right bundle branch block 06/16/2019   B12 deficiency 06/15/2019   History of TIA (transient ischemic attack) 10/23/2017   Chronic kidney disease, stage 3b (HCC) 07/21/2015   Medication management 07/04/2014   Essential hypertension 11/25/2013   Hyperlipidemia, mixed 11/25/2013   Abnormal glucose 11/25/2013   Vitamin D  deficiency 11/25/2013   Essential tremor 11/25/2013   History of endometrial cancer    Hx/o DVT/PE    Urethral prolapse    Osteoporosis 10/10/2011  ONSET DATE: referral date 08/10/24 (injury 04/27/24)  REFERRING DIAG: S/P L intertrachanteric femur fracture, Left distal radius fracture 04/28/2024  THERAPY DIAG:  Other lack of coordination  Muscle weakness (generalized)  Visuospatial deficit  Attention and concentration deficit  Rationale for Evaluation and Treatment: Rehabilitation  SUBJECTIVE:   SUBJECTIVE STATEMENT: Pt reports that the nerve conduction went well. The doctor said it was severe but not severe enough that I can't come back from it. Pt also reports having an appt with Dr. Erwin on 09/28/24.   Pt accompanied by: self (dropped off)  PERTINENT HISTORY:   Per Dr. Erwin consult 04/28/24: 89 y.o. female who presents after  mechanical fall with notable pain to the left hip and wrist.  She was seen in the emergency department setting overnight, underwent clinical and radiographic workup which showed a left hip intertrochanteric femur fracture as well as an associated left distal radius fracture.  She underwent bedside hematoma block and closed reduction of the left wrist overnight with improved length and alignment of the distal radius, she was subsequently placed into a sugar-tong splint by the emergency department.  Today, she is comfortable at rest, denies any significant numbness or tingling in the left hand, splint remains in place, pain controlled at rest. Patient is to undergo left hip fracture fixation by the trauma service during this admission.  Based on her age and comorbidities, her left distal radius fracture is extra-articular in nature and minimally displaced, we can proceed with nonoperative care of this injury.  I did explain to the patient the ongoing process of fracture healing as well as the possibility for repeat displacement or shortening with time that may warrant surgical intervention.   Hand surgery will be available moving forward as needed.  Discussed with treating trauma surgeon for the hip fracture.  PRECAUTIONS: None  RED FLAGS: None   WEIGHT BEARING RESTRICTIONS: No  PAIN:  Are you having pain? No  FALLS: Has patient fallen in last 6 months? Yes. Number of falls 1 that resulted in hip and wrist fracture  LIVING ENVIRONMENT: Lives with: lives alone Lives in: House/apartment Stairs: 3 steps to enter, flight of steps to the basement but she does not go down them Has following equipment at home: [name] - 2 wheeled, shower chair, and Grab bars  PLOF: Independent with basic ADLs and Requires assistive device for independence; step-daughter will do laundry as pt's washer/dryer are in the basement  PATIENT GOALS: to be able to use Left hand again  NEXT MD VISIT: 09/28/24 with Dr.  Erwin  OBJECTIVE:  Note: Objective measures were completed at Evaluation unless otherwise noted.  HAND DOMINANCE: Right  ADLs: WFL; difficulty with donning compression sock on L leg  FUNCTIONAL OUTCOME MEASURES: Quick Dash: 50%   UPPER EXTREMITY ROM:     Active ROM Right eval Left eval  Shoulder flexion    Shoulder abduction    Shoulder adduction    Shoulder extension    Shoulder internal rotation    Shoulder external rotation    Elbow flexion    Elbow extension    Wrist flexion 75 30  Wrist extension 60 33  Wrist ulnar deviation 30 18  Wrist radial deviation 12 5  Wrist pronation WFL 60%  Wrist supination WFL WFL  (Blank rows = not tested)  Loose grasp on L, index and ring finger do not touch palm of hand, decreased hook fist  UPPER EXTREMITY MMT:     MMT Right eval Left eval  Shoulder flexion    Shoulder abduction    Shoulder adduction    Shoulder extension    Shoulder internal rotation    Shoulder external rotation    Middle trapezius    Lower trapezius    Elbow flexion    Elbow extension    Wrist flexion    Wrist extension    Wrist ulnar deviation    Wrist radial deviation    Wrist pronation    Wrist supination    (Blank rows = not tested)  HAND FUNCTION: Grip strength: Right: 40 lbs; Left: 5 lbs  COORDINATION: Mild B tremors 09/13/24: 9 hole peg test: Right: 39.81 sec; Left: 60.72 sec OF note, pt with B essential tremor - however impaired sensation and decreased wrist flexion/extension impacting coordination  SENSATION: Numbness and tingling in R hand and digits along median nerve distribution  EDEMA: 26 cm R wrist circumference; 27.75 cm L wrist circumference  COGNITION: Overall cognitive status: Within functional limits for tasks assessed  OBSERVATIONS: Pt L wrist at ulnar head and dorsal wrist enlarged compared to R wrist and hand.  Pt demonstrating ~50% movement in all directions when compared to R, but verbalizing good understanding of  exercises.   TREATMENT DATE:  09/21/24: -Applied Kinesiotape over L dorsum of hand and distal forearm between digits II III and IV at approximately 50% pull with the aim of reducing edema in L wrist. Prior to application, patient denied any history of skin irritation or allergy to adhesive. Educated patient on purpose of kinesiotape placement and signs symptoms of allergic reaction or irritation. Instructed patient that the tape can be left on up to 3 days and can be worn in the shower. Instructed to removed the tape if peeling off, signs of skin irritation arise, or it has been on for >3 days. Informed patient that the tape is best removed in the shower or with a wet wash cloth. Patient stated understanding.   Assigned rubber band exercise sto increase L grip strength and FM coordination for carryover with ADL and functional use of L hand. Gave handout and verbal instructin as well as visual demo for increased carryover. Instructed pt to stop if either exercise causes undue pressure or pain. Pt verbalized understanding.   09/13/24 HEP: pt reporting minimal changes in ROM or function in LUE.  Pt does report that she is having changes in sensation and numbness with PROM, therefore OT encouraging pt to stop completing those and just focusing on tendon glides and AROM.   Self-care: engaged in lengthy conversation about technique for EMG testing and desire to await results from that assessment prior to continuing on with too much therapy, especially if pt to pursue surgery for CTS.  OT educating on median nerve compression and how it is presenting in her symptoms as well as providing additional examples to reiterate understanding.   Joint protection: educated on keeping wrist in neutral position as much as possible when picking up and carrying items.  Provided with handout with pictures and instructions to facilitate carryover.   Massage: OT educating on gentle massage to wrist, hand, and in to digits to  attempt to promote improvements in sensation.  However educating on nature of compression and it's impact on her sensation. 9 hole peg test: Right: 39.81 sec; Left: 60.72 sec   08/30/24 Issued HEP with focus on tendon glides, thumb opposition, wrist flexion/extension with stretch, and forearm supination with stretch.  Pt familiar with majority of exercises, however benefiting from cues and  demonstration for table top position during tendon glides and wrist flexion/extension and supination with additional stretch in neutral position on table top.                                                                                                                  PATIENT EDUCATION: Education details: Educated on role and purpose of OT as well as potential interventions and goals for therapy based on initial evaluation findings. Person educated: Patient Education method: Explanation, Demonstration, and Handouts Education comprehension: verbalized understanding and needs further education  HOME EXERCISE PROGRAM: Access Code:  [email or web address] URL: [email or web address] Date: 09/22/24 Prepared by: [facility] - Outpatient  Rehab - [facility] -Seated Resisted Finger Extension and Thumb Abduction 2x10  -Seated Finger Flexion with Resistance 2x10  Access Code: [email or web address] URL: [email or web address] Date: 08/30/2024 Prepared by: [facility] - Outpatient  Rehab - [facility]  Exercises - Seated Digit Tendon Gliding  - 4 x daily - 20 reps - Thumb AROM Opposition  - 4 x daily - 20 reps - Seated Thumb Composite Flexion AROM  - 4 x daily - 20 reps - 5 sec hold - Wrist Flexion Extension AROM with Fingers Curled and Palm Down  - 4 x daily - 20 reps - 3-5 sec hold - Wrist AROM Radial Ulnar Deviation  - 4 x daily - 20 reps - Forearm Supination PROM  - 4 x daily - 20 reps - 10 sec hold - Seated Wrist Flexion Extension PROM  - 2-3 x daily - 20 reps  GOALS: Goals reviewed with  patient? Yes  SHORT TERM GOALS: Target date: 09/24/24  Pt will be independent with HEP for ROM and strengthening. Baseline: new to OPOT Goal status: in progress  2.  Pt will verbalize understanding of task modifications and/or potential A/E needs to increase ease, safety, and independence w/ ADLs. Baseline: difficulty with donning compression socks d/t decreased grip and mobility of L wrist/hand Goal status: in progress  3.  Pt will complete 9 hole peg test with LUE. Baseline:  Goal status: MET - Right: 39.81 sec; Left: 60.72 sec    LONG TERM GOALS: Target date: 10/15/24  Pt will demonstrate improved coordination of LUE as evidenced by completing 9 hole peg test in 35 seconds or less as needed for [facility] tasks with ADLs/IADLS. Baseline: Right: 39.81 sec; Left: 60.72 sec Goal status: in progress  2.  Pt will improve grip strength in left hand to be within at least 20 pounds of her right hand for functional use at home and in IADLs.  Baseline: L: 5# and R: 40# Goal status: in progress  3.  Pt will demonstrate improved L wrist flexion/extension to 80% of R wrist flexion/extension for improved functional use.  Baseline: R:75/60 and L: 30/33 Goal status: in progress  4.  Pt will demonstrate improved L forearm supination to 80% of R forearm supination for improved functional use. Baseline: 60% Goal status: in progress  5.  Pt will demonstrate improvements in pain and functional  use of dominant RUE as evidenced by decreased score on QuickDASH from 50% impairement to < 30 % impairment.  Baseline: 50% Goal status: in progress   ASSESSMENT:  CLINICAL IMPRESSION: Patient is a 89 y.o. female who was seen today for occupational therapy evaluation for L distal radius fracture. Pt reports receiving appt with Dr Erwin on 10/21. Pt at this time receptive to kinesiotaping and tolerated well, also tolerated HEP well with resistance and demonstrates good understanding of completion and on when to  cease exercise.  Pt at this time would benefit from con   PERFORMANCE DEFICITS: in functional skills including ADLs, IADLs, coordination, sensation, edema, ROM, strength, flexibility, Fine motor control, Gross motor control, body mechanics, endurance, decreased knowledge of precautions, and UE functional use and psychosocial skills including routines and behaviors.      PLAN:  OT FREQUENCY: 1-2x/week  OT DURATION: 6 weeks  PLANNED INTERVENTIONS: 97168 OT Re-evaluation, 97535 self care/ADL training, 02889 therapeutic exercise, 97530 therapeutic activity, 97112 neuromuscular re-education, 97140 manual therapy, 97035 ultrasound, 97018 paraffin, 02960 fluidotherapy, 97760 Orthotic Initial, 97763 Orthotic/Prosthetic subsequent, passive range of motion, compression bandaging, coping strategies training, patient/family education, and DME and/or AE instructions  RECOMMENDED OTHER SERVICES: NA  CONSULTED AND AGREED WITH PLAN OF CARE: Patient  PLAN FOR NEXT SESSION: review HEP, f/u kinesiotaping See what plan is after Dr. Erwin appt (10/21)   Rocky Dutch, OTR/L 09/22/2024, 12:21 PM  [facility] [address] [place], [place] [address] Phone # [phone] Fax # [phone]

## 2024-09-22 NOTE — Therapy (Signed)
 OUTPATIENT PHYSICAL THERAPY NEURO TREATMENT NOTE   Patient Name: Monica Curry MRN: [id number] DOB:August 02, 1931, 89 y.o., female Today's Date: 09/22/2024   PCP: Wendolyn Jenkins Jansky, MD REFERRING PROVIDER: Danton Lauraine LABOR, PA-C   END OF SESSION:  PT End of Session - 09/22/24 0847     Visit Number 5    Number of Visits 17    Date for Recertification  10/29/24    Authorization Type [employer]    Authorization Time Period 08/30/2024-10/29/2024    Authorization - Visit Number 5    Authorization - Number of Visits 17    Progress Note Due on Visit 10    PT Start Time 0845    PT Stop Time 0930    PT Time Calculation (min) 45 min    Equipment Utilized During Treatment Gait belt    Activity Tolerance Patient tolerated treatment well    Behavior During Therapy WFL for tasks assessed/performed            Past Medical History:  Diagnosis Date   Allergy    Medications and pollen   Anemia    Anxiety    Always   C. difficile colitis    Cancer (HCC)    Endometrial cancer   Cataract    Had surgery 2021   Chronic kidney disease 2018   2 siblings had kidney failure, sister had transplant   Clotting disorder    Essential and other specified forms of tremor    Hyperlipidemia    Hypertension    Osteoporosis 11/2019   T score -2.8 stable from prior DEXA   Personal history of venous thrombosis and embolism 1997   Pulmonary embolism (HCC) 06/14/2021   Pulmonary embolus (HCC)    Shingles    Stroke (HCC) 2018   TIA   Thrush    TIA (transient ischemic attack)    carotid US  07/2022: no ICA stenosis bilaterally   Past Surgical History:  Procedure Laterality Date   ABDOMINAL HYSTERECTOMY  1997   APPENDECTOMY  1961   cataract surg Bilateral 2021   Dr. Roz, R eye 8/18, L 8/25   CESAREAN SECTION  1961   EYE SURGERY  2021   Cataract both eyes   FRACTURE SURGERY  2025   Broken hip   INTRAMEDULLARY (IM) NAIL INTERTROCHANTERIC Left 04/28/2024   Procedure: FIXATION,  FRACTURE, INTERTROCHANTERIC, WITH INTRAMEDULLARY ROD;  Surgeon: Kendal Franky SQUIBB, MD;  Location: [facility] OR;  Service: Orthopedics;  Laterality: Left;   TONSILLECTOMY AND ADENOIDECTOMY  1944   TOTAL ABDOMINAL HYSTERECTOMY W/ BILATERAL SALPINGOOPHORECTOMY  1997   Patient Active Problem List   Diagnosis Date Noted   RLS (restless legs syndrome) 06/15/2024   Fracture, Col[place], left, closed 04/28/2024   Hip fracture (HCC) 04/27/2024   GAD (generalized anxiety disorder) 04/01/2024   Thyroid  nodule 01/10/2022   Anxiety 08/10/2021   Aortic atherosclerosis (HCC) - CT 06/2021 06/15/2021   Bilateral renal cysts 07/06/2020   Paroxysmal supraventricular tachycardia 10/20/2019   History of skin cancer (basal cell) 06/16/2019   Complete right bundle branch block 06/16/2019   B12 deficiency 06/15/2019   History of TIA (transient ischemic attack) 10/23/2017   Chronic kidney disease, stage 3b (HCC) 07/21/2015   Medication management 07/04/2014   Essential hypertension 11/25/2013   Hyperlipidemia, mixed 11/25/2013   Abnormal glucose 11/25/2013   Vitamin D  deficiency 11/25/2013   Essential tremor 11/25/2013   History of endometrial cancer    Hx/o DVT/PE    Urethral prolapse    Osteoporosis 10/10/2011  ONSET DATE: 04/28/2024  REFERRING DIAG: S/P L intertrachanteric femur fracture, Left distal radius fracture 04/28/2024   THERAPY DIAG:  Muscle weakness (generalized)  Unsteadiness on feet  Other abnormalities of gait and mobility  Rationale for Evaluation and Treatment: Rehabilitation  SUBJECTIVE:                                                                                                                                                                                             SUBJECTIVE STATEMENT: Doing fine, no new issues.  Had a NCV test for left hand yesterday Pt accompanied by: self  PERTINENT HISTORY: osteoporosis, hx of DVT; see additional PMH above  PAIN:  Are you having pain?  No  PRECAUTIONS: Fall, wears L compression stocking due to L LE edema (was negative for DVT)  RED FLAGS: None   WEIGHT BEARING RESTRICTIONS: No  FALLS: Has patient fallen in last 6 months? Yes. Number of falls 1  LIVING ENVIRONMENT: Lives with: lives alone Lives in: House/apartment Stairs: 3 steps to enter home;  Has following equipment at home: Single point cane and Walker - 2 wheeled *cane has tripod base PLOF: Independent  PATIENT GOALS: To be able to transition to the cane, so I can get in and out of home better.  OBJECTIVE:   TODAY'S TREATMENT: 09/22/24 Activity Comments  Demo of ankle weight set-up   Dynamic balance x 60 sec -sidestep -high step -tandem walk -retrowalk  Single leg stance 4x30 sec -LLE on 2 step to incr left hip abd recruitment. RUE support 80% of time  Multisensory balance To facilitate left hip abd recruitment. Increased sway with eyes closed conditions and tendency for retro-LOB. UE support 90% of time for rockerboard  Bosu alt taps x 60 sec UE, no UE support  Bosu lunges UE support 2x10 for ROM     TODAY'S TREATMENT: 09/13/24 Activity Comments  Seated LE PRE 3#, 2x10 Seated march LAQ Hip add iso  Standing LE PRE 3# -sidestepping x 60 sec -hip abd 2x10 -hip ext 2x10 -stair taps 2x10, 8#  Gait training Trials w/ cane and SBA-CGA level surfaces, stepping over 6 hurdles, on/off 6 step -retrowalk  Static balance Rocker board to improve weight shift and SLS           TODAY'S TREATMENT: 09/09/2024 Activity Comments  Seated march, 2 x 10 Seated LAQ, 3 x 10 2#  Sidestepping 3 reps at counter Cues for foot placement, 2# BLE  Hip extension x 10,  Hip abduction x 10 2# BLE  Forward/back walking at counter x 5 reps Cues for equal, even foot placement  LLE as  stance with RLE forward/back stepping x 10 1 UE support  Alternating step taps 3 x 10 taps,  Light BUE>then 1 UE support> no support with min guard (more difficulty with LLE stance  Gait  training with cane small quad tip base, 100 ft, then 85 ft x 2 (incorporating turns for gentle changes of direction)  Min guard and cues to look ahead; good sequencing with cane  Short distance gait 10 ft x 4 reps, cane with small quad tip base      Access Code: [email or web address] URL: [email or web address] Date: 09/09/2024 Prepared by: [facility] - Outpatient  Rehab - [facility]  Program Notes Marching in place, side hip kicks, back hip kicks:  Do 3 sets of 10 with 2# weight  Exercises - Backward Walking with Counter Support  - 1 x daily - 5 x weekly - 1 sets - 5 reps - Alternating Step Taps with Counter Support  - 1 x daily - 5 x weekly - 3 sets - 10 reps - Side Stepping with Counter Support  - 1 x daily - 5 x weekly - 1 sets - 5 reps  PATIENT EDUCATION: Education details: HEP initiated, try to get ankle weights Person educated: Patient Education method: Explanation, Demonstration, and Handouts Education comprehension: verbalized understanding, returned demonstration, and needs further education  -------------------------------------------- Note: Objective measures were completed at Evaluation unless otherwise noted.  DIAGNOSTIC FINDINGS: Interval proximal left femoral cephalomedullary nail fixation of the previously seen intertrochanteric fracture. Improved alignment.  COGNITION: Overall cognitive status: Within functional limits for tasks assessed   SENSATION: Reports decreased sensation in L fingers (to see hand surgeon)  EDEMA:  Noted LLE; wearing compression stocking  POSTURE: rounded shoulders and increased thoracic kyphosis  LOWER EXTREMITY ROM:     Active  Right Eval Left Eval  Hip flexion    Hip extension    Hip abduction    Hip adduction    Hip internal rotation    Hip external rotation    Knee flexion    Knee extension    Ankle dorsiflexion  10  Ankle plantarflexion  40  Ankle inversion    Ankle eversion     (Blank rows = not  tested)  LOWER EXTREMITY MMT:    MMT Right Eval Left Eval  Hip flexion 4 3+  Hip extension    Hip abduction 5 4  Hip adduction 5 4  Hip internal rotation    Hip external rotation    Knee flexion 4+ 4+  Knee extension 4+ 4  Ankle dorsiflexion 4+ 4  Ankle plantarflexion    Ankle inversion    Ankle eversion    (Blank rows = not tested)  TRANSFERS: Sit to stand: Modified independence  Assistive device utilized: None     Stand to sit: Modified independence  Assistive device utilized: hands at chair      STAIRS: Findings: Level of Assistance: Modified independence, Stair Negotiation Technique: Step to Pattern with Single Rail on Right, Number of Stairs: 2-3, Height of Stairs: 4-6   , and Comments: prior to fracture, pt negotiated steps alternating pattern GAIT: Findings: Gait Characteristics: step through pattern, decreased step length- Right, and decreased stance time- Left, Distance walked: 50 ft x 2, Assistive device utilized:Walker - 2 wheeled and SPC with small quad tip base, Level of assistance: Modified independence, CGA, and Min A, and Comments: Mod I with RW, CGA/min assist with cane  FUNCTIONAL TESTS:  10 meter walk test: 16.78 sec RW (1.95 ft/sec); 33.62  sec with cane (0.98 ft/sec) Berg:  34/56 (Scores <45/56 indicate increased fall risk)                                                                                                                               TREATMENT DATE: 08/30/2024    PATIENT EDUCATION: Education details: Eval results, POC, continue HEP from HHPT; verbally added lateral weightshifting to LLE, with attention to light UE support Person educated: Patient Education method: Explanation, Demonstration, and Verbal cues Education comprehension: verbalized understanding, returned demonstration, and needs further education  HOME EXERCISE PROGRAM: Verbally initiated lateral weightshifting; pt doing standing hip kicks, heel/toe raises, marching and sit  to stand at home  GOALS: Goals reviewed with patient? Yes  SHORT TERM GOALS: Target date: 10/01/2024  Pt will be independent with HEP for improved gait, stair negotiation, balance. Baseline: Goal status: IN PROGRESS  2.  Pt will improve Berg score to at least 45/56 to decrease fall risk. Baseline: 34/56 Goal status: IN PROGRESS  3.  Pt will negotiate curb and 3 steps with cane, mod I, for improved household and community gait. Baseline:  Goal status: IN PROGRESS   LONG TERM GOALS: Target date: 10/29/2024  Pt will be independent with HEP for improved balance, strength, gait, stairs. Baseline:  Goal status: IN PROGRESS  2.  Pt will improve DGI score to at least 20/24 to decrease fall risk. Baseline: TBD Goal status:IN PROGRESS  3.  Pt will improve gait velocity to at least 2.6 ft/sec with cane for improved gait efficiency and safety. Baseline: 0.98 ft/sec Goal status: IN PROGRESS  4.  Pt will ambulate at least 500 ft, indoor and outdoor gait, mod I with cane for improved community mobility. Baseline:  Goal status: IN PROGRESS   ASSESSMENT:  CLINICAL IMPRESSION: Dynamic and static balance activities for neuromuscular re-education to enhance LLE facilitation/single limb support. Good control with minimizing left Trendelenberg. Difficulty with postural control under multisensory conditions requiring UE support throughout. Continue sessions to progess to PLOF capabilities such as walking w/ cane  OBJECTIVE IMPAIRMENTS: Abnormal gait, decreased balance, decreased mobility, difficulty walking, and decreased strength.   ACTIVITY LIMITATIONS: standing, stairs, transfers, and locomotion level  PARTICIPATION LIMITATIONS: meal prep, cleaning, driving, shopping, community activity, yard work, and church  PERSONAL FACTORS: 3+ comorbidities: see PMH above are also affecting patient's functional outcome.   REHAB POTENTIAL: Good  CLINICAL DECISION MAKING: Evolving/moderate  complexity  EVALUATION COMPLEXITY: Moderate  PLAN:  PT FREQUENCY: 2x/week  PT DURATION: 8 weeks plus eval visit  PLANNED INTERVENTIONS: 97750- Physical Performance Testing, 97110-Therapeutic exercises, 97530- Therapeutic activity, [id number]- Neuromuscular re-education, 97535- Self Care, [id number]- Manual therapy, [phone]- Gait training, Patient/Family education, Balance training, Stair training, and DME instructions  PLAN FOR NEXT SESSION: Review  HEP  and progress.  Work on ankle weight progression.  (to address LLE strength, SLS, balance)-at counter, as she would have at home; gait training with cane; stair negotiation with cane.  Perform DGI test with cane   Jonette MARLA Sandifer, PT 09/22/2024, 8:48 AM  [facility] [address] [place], [address] [place], [place] [address] Phone # [phone] Fax # [phone]

## 2024-09-25 ENCOUNTER — Other Ambulatory Visit: Payer: Self-pay | Admitting: Family Medicine

## 2024-09-27 ENCOUNTER — Ambulatory Visit: Admitting: Occupational Therapy

## 2024-09-27 ENCOUNTER — Ambulatory Visit: Admitting: Physical Therapy

## 2024-09-27 ENCOUNTER — Encounter: Payer: Self-pay | Admitting: Physical Therapy

## 2024-09-27 DIAGNOSIS — R278 Other lack of coordination: Secondary | ICD-10-CM | POA: Diagnosis not present

## 2024-09-27 DIAGNOSIS — M6281 Muscle weakness (generalized): Secondary | ICD-10-CM

## 2024-09-27 DIAGNOSIS — R41840 Attention and concentration deficit: Secondary | ICD-10-CM | POA: Diagnosis not present

## 2024-09-27 DIAGNOSIS — R208 Other disturbances of skin sensation: Secondary | ICD-10-CM | POA: Diagnosis not present

## 2024-09-27 DIAGNOSIS — R2681 Unsteadiness on feet: Secondary | ICD-10-CM

## 2024-09-27 DIAGNOSIS — R2689 Other abnormalities of gait and mobility: Secondary | ICD-10-CM | POA: Diagnosis not present

## 2024-09-27 DIAGNOSIS — M25632 Stiffness of left wrist, not elsewhere classified: Secondary | ICD-10-CM

## 2024-09-27 DIAGNOSIS — R41842 Visuospatial deficit: Secondary | ICD-10-CM | POA: Diagnosis not present

## 2024-09-27 NOTE — Therapy (Signed)
 OUTPATIENT OCCUPATIONAL THERAPY ORTHO  Treatment Note  Patient Name: Monica Curry MRN: [id number] DOB:1931-01-17, 89 y.o., female Today's Date: 09/27/2024  PCP: Wendolyn Jenkins Jansky, MD REFERRING PROVIDER: Danton Lauraine LABOR, PA-C  END OF SESSION:  OT End of Session - 09/27/24 1143     Visit Number 4    Number of Visits 11    Date for Recertification  10/15/24    Authorization Type [employer]    Authorization Time Period approved 11 OT visits from 08/30/2024 - 10/15/2024    Authorization - Visit Number 4    Authorization - Number of Visits 11    OT Start Time 1102    OT Stop Time 1132    OT Time Calculation (min) 30 min    Equipment Utilized During Treatment kinesiotape, rubber band    Activity Tolerance Patient tolerated treatment well    Behavior During Therapy WFL for tasks assessed/performed             Past Medical History:  Diagnosis Date   Allergy    Medications and pollen   Anemia    Anxiety    Always   C. difficile colitis    Cancer (HCC)    Endometrial cancer   Cataract    Had surgery 2021   Chronic kidney disease 2018   2 siblings had kidney failure, sister had transplant   Clotting disorder    Essential and other specified forms of tremor    Hyperlipidemia    Hypertension    Osteoporosis 11/2019   T score -2.8 stable from prior DEXA   Personal history of venous thrombosis and embolism 1997   Pulmonary embolism (HCC) 06/14/2021   Pulmonary embolus (HCC)    Shingles    Stroke (HCC) 2018   TIA   Thrush    TIA (transient ischemic attack)    carotid US  07/2022: no ICA stenosis bilaterally   Past Surgical History:  Procedure Laterality Date   ABDOMINAL HYSTERECTOMY  1997   APPENDECTOMY  1961   cataract surg Bilateral 2021   Dr. Roz, R eye 8/18, L 8/25   CESAREAN SECTION  1961   EYE SURGERY  2021   Cataract both eyes   FRACTURE SURGERY  2025   Broken hip   INTRAMEDULLARY (IM) NAIL INTERTROCHANTERIC Left 04/28/2024   Procedure:  FIXATION, FRACTURE, INTERTROCHANTERIC, WITH INTRAMEDULLARY ROD;  Surgeon: Kendal Franky SQUIBB, MD;  Location: [facility] OR;  Service: Orthopedics;  Laterality: Left;   TONSILLECTOMY AND ADENOIDECTOMY  1944   TOTAL ABDOMINAL HYSTERECTOMY W/ BILATERAL SALPINGOOPHORECTOMY  1997   Patient Active Problem List   Diagnosis Date Noted   RLS (restless legs syndrome) 06/15/2024   Fracture, Col[place], left, closed 04/28/2024   Hip fracture (HCC) 04/27/2024   GAD (generalized anxiety disorder) 04/01/2024   Thyroid  nodule 01/10/2022   Anxiety 08/10/2021   Aortic atherosclerosis (HCC) - CT 06/2021 06/15/2021   Bilateral renal cysts 07/06/2020   Paroxysmal supraventricular tachycardia 10/20/2019   History of skin cancer (basal cell) 06/16/2019   Complete right bundle branch block 06/16/2019   B12 deficiency 06/15/2019   History of TIA (transient ischemic attack) 10/23/2017   Chronic kidney disease, stage 3b (HCC) 07/21/2015   Medication management 07/04/2014   Essential hypertension 11/25/2013   Hyperlipidemia, mixed 11/25/2013   Abnormal glucose 11/25/2013   Vitamin D  deficiency 11/25/2013   Essential tremor 11/25/2013   History of endometrial cancer    Hx/o DVT/PE    Urethral prolapse    Osteoporosis 10/10/2011  ONSET DATE: referral date 08/10/24 (injury 04/27/24)  REFERRING DIAG: S/P L intertrachanteric femur fracture, Left distal radius fracture 04/28/2024  THERAPY DIAG:  Other lack of coordination  Muscle weakness (generalized)  Stiffness of left wrist, not elsewhere classified  Other disturbances of skin sensation  Rationale for Evaluation and Treatment: Rehabilitation  SUBJECTIVE:   SUBJECTIVE STATEMENT: Pt reports that she had the nerve conduction completed and that the doctor said it was severe but not severe enough that I can't come back from it. Pt also reports having an appt with Dr. Erwin on 09/28/24.   Pt reports unable to tolerate the kinesiotape as it was just pulling such that  it was uncomfortable.   Pt accompanied by: self (dropped off)  PERTINENT HISTORY:   Per Dr. Erwin consult 04/28/24: 89 y.o. female who presents after mechanical fall with notable pain to the left hip and wrist.  She was seen in the emergency department setting overnight, underwent clinical and radiographic workup which showed a left hip intertrochanteric femur fracture as well as an associated left distal radius fracture.  She underwent bedside hematoma block and closed reduction of the left wrist overnight with improved length and alignment of the distal radius, she was subsequently placed into a sugar-tong splint by the emergency department.  Today, she is comfortable at rest, denies any significant numbness or tingling in the left hand, splint remains in place, pain controlled at rest. Patient is to undergo left hip fracture fixation by the trauma service during this admission.  Based on her age and comorbidities, her left distal radius fracture is extra-articular in nature and minimally displaced, we can proceed with nonoperative care of this injury.  I did explain to the patient the ongoing process of fracture healing as well as the possibility for repeat displacement or shortening with time that may warrant surgical intervention.   Hand surgery will be available moving forward as needed.  Discussed with treating trauma surgeon for the hip fracture.  PRECAUTIONS: None  RED FLAGS: None   WEIGHT BEARING RESTRICTIONS: No  PAIN:  Are you having pain? No  FALLS: Has patient fallen in last 6 months? Yes. Number of falls 1 that resulted in hip and wrist fracture  LIVING ENVIRONMENT: Lives with: lives alone Lives in: House/apartment Stairs: 3 steps to enter, flight of steps to the basement but she does not go down them Has following equipment at home: [name] - 2 wheeled, shower chair, and Grab bars  PLOF: Independent with basic ADLs and Requires assistive device for independence; step-daughter  will do laundry as pt's washer/dryer are in the basement  PATIENT GOALS: to be able to use Left hand again  NEXT MD VISIT: 09/28/24 with Dr. Erwin  OBJECTIVE:  Note: Objective measures were completed at Evaluation unless otherwise noted.  HAND DOMINANCE: Right  ADLs: WFL; difficulty with donning compression sock on L leg  FUNCTIONAL OUTCOME MEASURES: Quick Dash: 50%   UPPER EXTREMITY ROM:     Active ROM Right eval Left eval  Shoulder flexion    Shoulder abduction    Shoulder adduction    Shoulder extension    Shoulder internal rotation    Shoulder external rotation    Elbow flexion    Elbow extension    Wrist flexion 75 30  Wrist extension 60 33  Wrist ulnar deviation 30 18  Wrist radial deviation 12 5  Wrist pronation WFL 60%  Wrist supination WFL WFL  (Blank rows = not tested)  Loose grasp on  L, index and ring finger do not touch palm of hand, decreased hook fist  UPPER EXTREMITY MMT:     MMT Right eval Left eval  Shoulder flexion    Shoulder abduction    Shoulder adduction    Shoulder extension    Shoulder internal rotation    Shoulder external rotation    Middle trapezius    Lower trapezius    Elbow flexion    Elbow extension    Wrist flexion    Wrist extension    Wrist ulnar deviation    Wrist radial deviation    Wrist pronation    Wrist supination    (Blank rows = not tested)  HAND FUNCTION: Grip strength: Right: 40 lbs; Left: 5 lbs  COORDINATION: Mild B tremors 09/13/24: 9 hole peg test: Right: 39.81 sec; Left: 60.72 sec OF note, pt with B essential tremor - however impaired sensation and decreased wrist flexion/extension impacting coordination  SENSATION: Numbness and tingling in R hand and digits along median nerve distribution  EDEMA: 26 cm R wrist circumference; 27.75 cm L wrist circumference  COGNITION: Overall cognitive status: Within functional limits for tasks assessed  OBSERVATIONS: Pt L wrist at ulnar head and dorsal wrist  enlarged compared to R wrist and hand.  Pt demonstrating ~50% movement in all directions when compared to R, but verbalizing good understanding of exercises.   TREATMENT DATE:  09/27/24 Self-care: engaged in lengthy conversation about results from EMG testing and how she is hopeful to pursue surgery for CTS.  OT reiterating education on median nerve compression and how it is presenting in her symptoms as well as providing additional examples to reiterate understanding.   NMR: OT educating pt on seated wrist median nerve glide completing x5 with cues for technique.  Pt with no c/o pain.  Reiterated completion of tasks within ROM and pain tolerance, and continuing to refrain from PROM or overpressure due to reports of pain.   09/21/24: -Applied Kinesiotape over L dorsum of hand and distal forearm between digits II III and IV at approximately 50% pull with the aim of reducing edema in L wrist. Prior to application, patient denied any history of skin irritation or allergy to adhesive. Educated patient on purpose of kinesiotape placement and signs symptoms of allergic reaction or irritation. Instructed patient that the tape can be left on up to 3 days and can be worn in the shower. Instructed to removed the tape if peeling off, signs of skin irritation arise, or it has been on for >3 days. Informed patient that the tape is best removed in the shower or with a wet wash cloth. Patient stated understanding.   Assigned rubber band exercise sto increase L grip strength and FM coordination for carryover with ADL and functional use of L hand. Gave handout and verbal instructin as well as visual demo for increased carryover. Instructed pt to stop if either exercise causes undue pressure or pain. Pt verbalized understanding.   09/13/24 HEP: pt reporting minimal changes in ROM or function in LUE.  Pt does report that she is having changes in sensation and numbness with PROM, therefore OT encouraging pt to stop  completing those and just focusing on tendon glides and AROM.   Self-care: engaged in lengthy conversation about technique for EMG testing and desire to await results from that assessment prior to continuing on with too much therapy, especially if pt to pursue surgery for CTS.  OT educating on median nerve compression and how it is  presenting in her symptoms as well as providing additional examples to reiterate understanding.   Joint protection: educated on keeping wrist in neutral position as much as possible when picking up and carrying items.  Provided with handout with pictures and instructions to facilitate carryover.   Massage: OT educating on gentle massage to wrist, hand, and in to digits to attempt to promote improvements in sensation.  However educating on nature of compression and it's impact on her sensation. 9 hole peg test: Right: 39.81 sec; Left: 60.72 sec                                                PATIENT EDUCATION: Education details: ROM and surgery protocol Person educated: Patient Education method: Explanation, Demonstration, and Handouts Education comprehension: verbalized understanding and needs further education  HOME EXERCISE PROGRAM: Access Code:  [email or web address] URL: [email or web address] Date: 09/22/24 Prepared by: [facility] - Outpatient  Rehab - [facility] -Seated Resisted Finger Extension and Thumb Abduction 2x10  -Seated Finger Flexion with Resistance 2x10  Access Code: [email or web address] URL: [email or web address] Date: 09/27/2024 Prepared by: [facility] - Outpatient  Rehab - [facility]  Exercises - Seated Digit Tendon Gliding  - 4 x daily - 20 reps - Thumb AROM Opposition  - 4 x daily - 20 reps - Seated Thumb Composite Flexion AROM  - 4 x daily - 20 reps - 5 sec hold - Wrist Flexion Extension AROM with Fingers Curled and Palm Down  - 4 x daily - 20 reps - 3-5 sec hold - Wrist AROM Radial Ulnar Deviation  - 4 x daily - 20  reps - Forearm Supination PROM  - 4 x daily - 20 reps - 10 sec hold - Seated Median Nerve Glide  - 4 x daily - 10 reps  GOALS: Goals reviewed with patient? Yes  SHORT TERM GOALS: Target date: 09/24/24  Pt will be independent with HEP for ROM and strengthening. Baseline: new to OPOT Goal status: in progress  2.  Pt will verbalize understanding of task modifications and/or potential A/E needs to increase ease, safety, and independence w/ ADLs. Baseline: difficulty with donning compression socks d/t decreased grip and mobility of L wrist/hand Goal status: in progress  3.  Pt will complete 9 hole peg test with LUE. Baseline:  Goal status: MET - Right: 39.81 sec; Left: 60.72 sec    LONG TERM GOALS: Target date: 10/15/24  Pt will demonstrate improved coordination of LUE as evidenced by completing 9 hole peg test in 35 seconds or less as needed for [facility] tasks with ADLs/IADLS. Baseline: Right: 39.81 sec; Left: 60.72 sec Goal status: in progress  2.  Pt will improve grip strength in left hand to be within at least 20 pounds of her right hand for functional use at home and in IADLs.  Baseline: L: 5# and R: 40# Goal status: in progress  3.  Pt will demonstrate improved L wrist flexion/extension to 80% of R wrist flexion/extension for improved functional use.  Baseline: R:75/60 and L: 30/33 Goal status: in progress  4.  Pt will demonstrate improved L forearm supination to 80% of R forearm supination for improved functional use. Baseline: 60% Goal status: in progress  5.  Pt will demonstrate improvements in pain and functional use of dominant RUE as evidenced by decreased score on QuickDASH from 50% impairement to <  30 % impairment.  Baseline: 50% Goal status: in progress   ASSESSMENT:  CLINICAL IMPRESSION: Patient is a 89 y.o. female who was seen today for occupational therapy evaluation for L distal radius fracture. Pt reports looking forward to appt with Dr Erwin on 10/21,  expressing desire to go forward with surgery if he continues to recommend it. Pt at this time tolerating ROM HEP well, however PROM and resistance were increasing her pain so she has stopped those. Pt demonstrates good understanding of completion and when to cease exercise.  Pt at this time to f/u with Dr. Erwin and then resume therapy if surgery not warranted or wait to resume after cleared s/p surgery.   PERFORMANCE DEFICITS: in functional skills including ADLs, IADLs, coordination, sensation, edema, ROM, strength, flexibility, Fine motor control, Gross motor control, body mechanics, endurance, decreased knowledge of precautions, and UE functional use and psychosocial skills including routines and behaviors.      PLAN:  OT FREQUENCY: 1-2x/week  OT DURATION: 6 weeks  PLANNED INTERVENTIONS: 97168 OT Re-evaluation, 97535 self care/ADL training, 02889 therapeutic exercise, 97530 therapeutic activity, 97112 neuromuscular re-education, 97140 manual therapy, 97035 ultrasound, 97018 paraffin, 02960 fluidotherapy, 97760 Orthotic Initial, 97763 Orthotic/Prosthetic subsequent, passive range of motion, compression bandaging, coping strategies training, patient/family education, and DME and/or AE instructions  RECOMMENDED OTHER SERVICES: NA  CONSULTED AND AGREED WITH PLAN OF CARE: Patient  PLAN FOR NEXT SESSION: review HEP, massage vs heat prior to exercise  See what plan is after Dr. Erwin appt (10/21)   KAYLENE DOMINO, OTR/L 09/27/2024, 11:44 AM  [facility] at [facility] [address] [place], [place] [address] Phone # [phone] Fax # [phone]

## 2024-09-27 NOTE — Therapy (Signed)
 OUTPATIENT PHYSICAL THERAPY NEURO TREATMENT NOTE   Patient Name: Monica Curry MRN: [id number] DOB:1931-11-15, 89 y.o., female Today's Date: 09/27/2024   PCP: Wendolyn Jenkins Jansky, MD REFERRING PROVIDER: Danton Lauraine LABOR, PA-C   END OF SESSION:  PT End of Session - 09/27/24 1023     Visit Number 6    Number of Visits 17    Date for Recertification  10/29/24    Authorization Type [employer]    Authorization Time Period 08/30/2024-10/29/2024    Authorization - Visit Number 6    Authorization - Number of Visits 17    Progress Note Due on Visit 10    PT Start Time 1023    PT Stop Time 1101    PT Time Calculation (min) 38 min    Equipment Utilized During Treatment Gait belt    Activity Tolerance Patient tolerated treatment well    Behavior During Therapy WFL for tasks assessed/performed             Past Medical History:  Diagnosis Date   Allergy    Medications and pollen   Anemia    Anxiety    Always   C. difficile colitis    Cancer (HCC)    Endometrial cancer   Cataract    Had surgery 2021   Chronic kidney disease 2018   2 siblings had kidney failure, sister had transplant   Clotting disorder    Essential and other specified forms of tremor    Hyperlipidemia    Hypertension    Osteoporosis 11/2019   T score -2.8 stable from prior DEXA   Personal history of venous thrombosis and embolism 1997   Pulmonary embolism (HCC) 06/14/2021   Pulmonary embolus (HCC)    Shingles    Stroke (HCC) 2018   TIA   Thrush    TIA (transient ischemic attack)    carotid US  07/2022: no ICA stenosis bilaterally   Past Surgical History:  Procedure Laterality Date   ABDOMINAL HYSTERECTOMY  1997   APPENDECTOMY  1961   cataract surg Bilateral 2021   Dr. Roz, R eye 8/18, L 8/25   CESAREAN SECTION  1961   EYE SURGERY  2021   Cataract both eyes   FRACTURE SURGERY  2025   Broken hip   INTRAMEDULLARY (IM) NAIL INTERTROCHANTERIC Left 04/28/2024   Procedure: FIXATION,  FRACTURE, INTERTROCHANTERIC, WITH INTRAMEDULLARY ROD;  Surgeon: Kendal Franky SQUIBB, MD;  Location: [facility] OR;  Service: Orthopedics;  Laterality: Left;   TONSILLECTOMY AND ADENOIDECTOMY  1944   TOTAL ABDOMINAL HYSTERECTOMY W/ BILATERAL SALPINGOOPHORECTOMY  1997   Patient Active Problem List   Diagnosis Date Noted   RLS (restless legs syndrome) 06/15/2024   Fracture, Colles, left, closed 04/28/2024   Hip fracture (HCC) 04/27/2024   GAD (generalized anxiety disorder) 04/01/2024   Thyroid  nodule 01/10/2022   Anxiety 08/10/2021   Aortic atherosclerosis (HCC) - CT 06/2021 06/15/2021   Bilateral renal cysts 07/06/2020   Paroxysmal supraventricular tachycardia 10/20/2019   History of skin cancer (basal cell) 06/16/2019   Complete right bundle branch block 06/16/2019   B12 deficiency 06/15/2019   History of TIA (transient ischemic attack) 10/23/2017   Chronic kidney disease, stage 3b (HCC) 07/21/2015   Medication management 07/04/2014   Essential hypertension 11/25/2013   Hyperlipidemia, mixed 11/25/2013   Abnormal glucose 11/25/2013   Vitamin D  deficiency 11/25/2013   Essential tremor 11/25/2013   History of endometrial cancer    Hx/o DVT/PE    Urethral prolapse    Osteoporosis  10/10/2011    ONSET DATE: 04/28/2024  REFERRING DIAG: S/P L intertrachanteric femur fracture, Left distal radius fracture 04/28/2024   THERAPY DIAG:  Unsteadiness on feet  Other abnormalities of gait and mobility  Muscle weakness (generalized)  Rationale for Evaluation and Treatment: Rehabilitation  SUBJECTIVE:                                                                                                                                                                                             SUBJECTIVE STATEMENT: Doing well.  My daughter came in for the weekend.  Will see MD tomorrow about likely surgery for L hand median nerve issue. Pt accompanied by: self  PERTINENT HISTORY: osteoporosis, hx of DVT;  see additional PMH above  PAIN:  Are you having pain? No  PRECAUTIONS: Fall, wears L compression stocking due to L LE edema (was negative for DVT)  RED FLAGS: None   WEIGHT BEARING RESTRICTIONS: No  FALLS: Has patient fallen in last 6 months? Yes. Number of falls 1  LIVING ENVIRONMENT: Lives with: lives alone Lives in: House/apartment Stairs: 3 steps to enter home;  Has following equipment at home: Single point cane and Walker - 2 wheeled *cane has tripod base PLOF: Independent  PATIENT GOALS: To be able to transition to the cane, so I can get in and out of home better.  OBJECTIVE:    TODAY'S TREATMENT: 09/27/2024 Activity Comments  DGI 11/24 See below  Lars 44/56 Improved from 34/56  Forward step ups 2 x 10, to 4 step BUE support  Side step ups x 10, 4 step BUE support  Forward step downs, 4 step BUE support  Stair negotiation with R rail, cues for step through pattern, with supervision Pt prefers step to pattern, but does step through pattern well  Gait short distances in session between activities with cane, small base quad tip     [facility] PT Assessment - 09/27/24 1028       Standardized Balance Assessment   Standardized Balance Assessment Berg Balance Test;Dynamic Gait Index      Berg Balance Test   Sit to Stand Able to stand without using hands and stabilize independently    Standing Unsupported Able to stand safely 2 minutes    Sitting with Back Unsupported but Feet Supported on Floor or Stool Able to sit safely and securely 2 minutes    Stand to Sit Sits safely with minimal use of hands    Transfers Able to transfer safely, minor use of hands    Standing Unsupported with Eyes Closed Able to stand 10 seconds safely    Standing Unsupported  with Feet Together Able to place feet together independently and stand 1 minute safely    From Standing, Reach Forward with Outstretched Arm Can reach forward >12 cm safely (5)    From Standing Position, Pick up Object from  Floor Able to pick up shoe, needs supervision    From Standing Position, Turn to Look Behind Over each Shoulder Looks behind one side only/other side shows less weight shift    Turn 360 Degrees Able to turn 360 degrees safely but slowly    Standing Unsupported, Alternately Place Feet on Step/Stool Able to complete >2 steps/needs minimal assist    Standing Unsupported, One Foot in Front Able to plae foot ahead of the other independently and hold 30 seconds    Standing on One Leg Tries to lift leg/unable to hold 3 seconds but remains standing independently   2.4 sec RLE, < 1 sec LLE   Total Score 44      Dynamic Gait Index   Level Surface Moderate Impairment   12.69 sec   Change in Gait Speed Mild Impairment    Gait with Horizontal Head Turns Moderate Impairment   14.51 sec   Gait with Vertical Head Turns Moderate Impairment   14.69 sec   Gait and Pivot Turn Mild Impairment   3.12 sec   Step Over Obstacle Moderate Impairment    Step Around Obstacles Moderate Impairment    Steps Mild Impairment    Total Score 11    DGI comment: Scores <19/24 indicate increased fall risk            Access Code: [email or web address] URL: [email or web address] Date: 09/09/2024 Prepared by: [facility] - [facility]  Program Notes (per pt report 10/20-using 3# at home) Marching in place, side hip kicks, back hip kicks:  Do 3 sets of 10 with 2# weight  Exercises - Backward Walking with Counter Support  - 1 x daily - 5 x weekly - 1 sets - 5 reps - Alternating Step Taps with Counter Support  - 1 x daily - 5 x weekly - 3 sets - 10 reps - Side Stepping with Counter Support  - 1 x daily - 5 x weekly - 1 sets - 5 reps  PATIENT EDUCATION: Education details: Progress towards goals, POC, continued work to transition to [facility]  Person educated: Patient Education method: Programmer, multimedia, Facilities manager, and Handouts Education comprehension: verbalized understanding, returned demonstration,  and needs further education  -------------------------------------------- Note: Objective measures were completed at Evaluation unless otherwise noted.  DIAGNOSTIC FINDINGS: Interval proximal left femoral cephalomedullary nail fixation of the previously seen intertrochanteric fracture. Improved alignment.  COGNITION: Overall cognitive status: Within functional limits for tasks assessed   SENSATION: Reports decreased sensation in L fingers (to see hand surgeon)  EDEMA:  Noted LLE; wearing compression stocking  POSTURE: rounded shoulders and increased thoracic kyphosis  LOWER EXTREMITY ROM:     Active  Right Eval Left Eval  Hip flexion    Hip extension    Hip abduction    Hip adduction    Hip internal rotation    Hip external rotation    Knee flexion    Knee extension    Ankle dorsiflexion  10  Ankle plantarflexion  40  Ankle inversion    Ankle eversion     (Blank rows = not tested)  LOWER EXTREMITY MMT:    MMT Right Eval Left Eval  Hip flexion 4 3+  Hip extension    Hip abduction 5  4  Hip adduction 5 4  Hip internal rotation    Hip external rotation    Knee flexion 4+ 4+  Knee extension 4+ 4  Ankle dorsiflexion 4+ 4  Ankle plantarflexion    Ankle inversion    Ankle eversion    (Blank rows = not tested)  TRANSFERS: Sit to stand: Modified independence  Assistive device utilized: None     Stand to sit: Modified independence  Assistive device utilized: hands at chair      STAIRS: Findings: Level of Assistance: Modified independence, Stair Negotiation Technique: Step to Pattern with Single Rail on Right, Number of Stairs: 2-3, Height of Stairs: 4-6   , and Comments: prior to fracture, pt negotiated steps alternating pattern GAIT: Findings: Gait Characteristics: step through pattern, decreased step length- Right, and decreased stance time- Left, Distance walked: 50 ft x 2, Assistive device utilized:Walker - 2 wheeled and SPC with small quad tip base, Level of  assistance: Modified independence, CGA, and Min A, and Comments: Mod I with RW, CGA/min assist with cane  FUNCTIONAL TESTS:  10 meter walk test: 16.78 sec RW (1.95 ft/sec); 33.62 sec with cane (0.98 ft/sec) Lars:  34/56 (Scores <45/56 indicate increased fall risk)   [facility] PT Assessment - 09/27/24 1028       Standardized Balance Assessment   Standardized Balance Assessment Berg Balance Test;Dynamic Gait Index      Berg Balance Test   Sit to Stand Able to stand without using hands and stabilize independently    Standing Unsupported Able to stand safely 2 minutes    Sitting with Back Unsupported but Feet Supported on Floor or Stool Able to sit safely and securely 2 minutes    Stand to Sit Sits safely with minimal use of hands    Transfers Able to transfer safely, minor use of hands    Standing Unsupported with Eyes Closed Able to stand 10 seconds safely    Standing Unsupported with Feet Together Able to place feet together independently and stand 1 minute safely    From Standing, Reach Forward with Outstretched Arm Can reach forward >12 cm safely (5)    From Standing Position, Pick up Object from Floor Able to pick up shoe, needs supervision    From Standing Position, Turn to Look Behind Over each Shoulder Looks behind one side only/other side shows less weight shift    Turn 360 Degrees Able to turn 360 degrees safely but slowly    Standing Unsupported, Alternately Place Feet on Step/Stool Able to complete >2 steps/needs minimal assist    Standing Unsupported, One Foot in Front Able to plae foot ahead of the other independently and hold 30 seconds    Standing on One Leg Tries to lift leg/unable to hold 3 seconds but remains standing independently   2.4 sec RLE, < 1 sec LLE   Total Score 44      Dynamic Gait Index   Level Surface Moderate Impairment   12.69 sec   Change in Gait Speed Mild Impairment    Gait with Horizontal Head Turns Moderate Impairment   14.51 sec   Gait with Vertical  Head Turns Moderate Impairment   14.69 sec   Gait and Pivot Turn Mild Impairment   3.12 sec   Step Over Obstacle Moderate Impairment    Step Around Obstacles Moderate Impairment    Steps Mild Impairment    Total Score 11    DGI comment: Scores <19/24 indicate increased fall risk  TREATMENT DATE: 08/30/2024    PATIENT EDUCATION: Education details: Eval results, POC, continue HEP from HHPT; verbally added lateral weightshifting to LLE, with attention to light UE support Person educated: Patient Education method: Explanation, Demonstration, and Verbal cues Education comprehension: verbalized understanding, returned demonstration, and needs further education  HOME EXERCISE PROGRAM: Verbally initiated lateral weightshifting; pt doing standing hip kicks, heel/toe raises, marching and sit to stand at home  GOALS: Goals reviewed with patient? Yes  SHORT TERM GOALS: Target date: 10/01/2024  Pt will be independent with HEP for improved gait, stair negotiation, balance. Baseline: Goal status: IN PROGRESS  2.  Pt will improve Berg score to at least 45/56 to decrease fall risk. Baseline: 34/56>44/56 09/27/2024 Goal status: PARTIALLY MET 09/27/2024  3.  Pt will negotiate curb and 3 steps with cane, mod I, for improved household and community gait. Baseline:  Goal status: IN PROGRESS   LONG TERM GOALS: Target date: 10/29/2024  Pt will be independent with HEP for improved balance, strength, gait, stairs. Baseline:  Goal status: IN PROGRESS  2.  Pt will improve DGI score to at least 20/24 to decrease fall risk. Baseline: TBD Goal status:IN PROGRESS  3.  Pt will improve gait velocity to at least 2.6 ft/sec with cane for improved gait efficiency and safety. Baseline: 0.98 ft/sec Goal status: IN PROGRESS  4.  Pt will ambulate at least 500 ft, indoor  and outdoor gait, mod I with cane for improved community mobility. Baseline:  Goal status: IN PROGRESS   ASSESSMENT:  CLINICAL IMPRESSION: Pt presents today with no new complaints. Skilled PT session focused on assessing static and dynamic balance, with Berg score improved 34>44/56 and DGI score 11/24 with cane.  Berg score has improved, and indicates increased confidence to progress towards cane; however, pt is at increased fall risk per Lars and DGI score.  She is working to progress to trying to get out of her home more independently with cane, so we will continue to work on short distance, household activities, stair and curb negotiation with her cane; likely longer distance outdoor gait still with walker for now.   Pt will continue to benefit from skilled PT towards goals for improved functional mobility and decreased fall risk.   OBJECTIVE IMPAIRMENTS: Abnormal gait, decreased balance, decreased mobility, difficulty walking, and decreased strength.   ACTIVITY LIMITATIONS: standing, stairs, transfers, and locomotion level  PARTICIPATION LIMITATIONS: meal prep, cleaning, driving, shopping, community activity, yard work, and church  PERSONAL FACTORS: 3+ comorbidities: see PMH above are also affecting patient's functional outcome.   REHAB POTENTIAL: Good  CLINICAL DECISION MAKING: Evolving/moderate complexity  EVALUATION COMPLEXITY: Moderate  PLAN:  PT FREQUENCY: 2x/week  PT DURATION: 8 weeks plus eval visit  PLANNED INTERVENTIONS: 97750- Physical Performance Testing, 97110-Therapeutic exercises, 97530- Therapeutic activity, [id number]- Neuromuscular re-education, 97535- Self Care, [id number]- Manual therapy, [phone]- Gait training, Patient/Family education, Balance training, Stair training, and DME instructions  PLAN FOR NEXT SESSION: Check STGs.  Review  HEP  and progress.  Work on ankle weight progression.  (to address LLE strength, SLS, balance)-at counter, as she would have at home. *Needs  to practice putting walker in the car, steps with cane, indoor gait with cane.  Ask about MD visit/potential for L hand surgery/needs to schedule more PT appts  STARLET GREIG ORN., PT 09/27/2024, 4:28 PM  [facility] at [facility] [address] [place], [address] [place], [place] [address] Phone # [phone] Fax # [phone]

## 2024-09-28 ENCOUNTER — Ambulatory Visit: Admitting: Orthopedic Surgery

## 2024-09-28 DIAGNOSIS — G5602 Carpal tunnel syndrome, left upper limb: Secondary | ICD-10-CM

## 2024-09-28 NOTE — Progress Notes (Signed)
 Monica Curry - 89 y.o. female MRN [id number]  Date of birth: [birth year]  Office Visit Note: Visit Date: 09/28/2024 PCP: Wendolyn Jenkins Jansky, MD Referred by: Wendolyn Jenkins Jansky, MD  Subjective: No chief complaint on file.  HPI: Monica Curry is a pleasant 89 y.o. female who returns today for evaluation of ongoing numbness in the left hand, particular to the index, long and ring finger.  She has a history notable for recent left intertrochanteric femur fracture with associated left distal radius fracture from May of this year.  She underwent cephalomedullary nailing of the left intertrochanteric femur fracture at that time, nonoperative care of the left distal radius fracture with splinting and follow-up.  Her left wrist has healed in a slightly shortened and dorsally angulated position, however her pain is well-controlled and she is making appropriate progress from a range of motion and usage standpoint.  She states that she is having ongoing numbness particularly to the radial sided digits of the hand that has been persistent since her injury.  She has been doing occupational therapy without significant improvement.  Has undergone recent EMG study which did confirm left sided carpal tunnel syndrome, severe in nature  Pertinent ROS were reviewed with the patient and found to be negative unless otherwise specified above in HPI.   Visit Reason: left hand numbness Duration of symptoms: 4 months Hand dominance: right Occupation: retired Diabetic: No Smoking: No Heart/Lung History: Cardiovascular and Mediastinum Essential hypertension Complete right bundle branch block Paroxysmal supraventricular tachycardia Aortic atherosclerosis (HCC) - CT 06/2021 Blood Thinners: Eliquis   Prior Testing/EMG: none Injections (Date): none Treatments: OT Prior Surgery: none  Assessment & Plan: Visit Diagnoses:  1. Carpal tunnel syndrome, left upper limb      Plan: Extensive discussion was had with  the patient today about her ongoing left sided carpal tunnel syndrome that is refractory to conservative care.  Patient has both clinical and electrodiagnostic evidence to confirm this diagnosis.  At this juncture, she is indicated for left open carpal tunnel release under local anesthesia or sedation.  Risks and benefits of both operations were discussed in detail today.  Understanding all risks and benefits, patient would like to have surgery done in the form of left open carpal tunnel release under local anesthesia at the next available date.  Risks include but not limited to infection, bleeding, scarring, stiffness, nerve injury or vascular, tendon injury, risk of recurrence and need for subsequent operation were all discussed in detail.  Patient consented understanding the above.  Will move forward surgical scheduling.  We did also review that she is currently on Eliquis  for history of pulmonary embolism, I am comfortable with her continuing this leading up to surgery.  Follow-up: No follow-ups on file.   Meds & Orders: No orders of the defined types were placed in this encounter.   No orders of the defined types were placed in this encounter.    Procedures: No procedures performed      Clinical History: No specialty comments available.  She reports that she has never smoked. She has never used smokeless tobacco. No results for input(s): HGBA1C, LABURIC in the last 8760 hours.  Objective:   Vital Signs: There were no vitals taken for this visit.  Physical Exam  Gen: Well-appearing, in no acute distress; non-toxic CV: Regular Rate. Well-perfused. Warm.  Resp: Breathing unlabored on room air; no wheezing. Psych: Fluid speech in conversation; appropriate affect; normal thought process  Ortho Exam Left upper extremity: - Skin is  intact, minimal swelling, no significant tenderness over the distal radius or hand region - Wrist range of motion flexion 25, extension 35, able to form  near composite fist, improved passively - Sensation is severely diminished in the median nerve distribution, positive Tinel's, positive Phalen's, positive Durkan's compression - APB 4/5 with mild thenar atrophy, thumb opposition is preserved  Imaging: No results found.   Past Medical/Family/Surgical/Social History: Medications & Allergies reviewed per EMR, new medications updated. Patient Active Problem List   Diagnosis Date Noted   RLS (restless legs syndrome) 06/15/2024   Fracture, Colles, left, closed 04/28/2024   Hip fracture (HCC) 04/27/2024   GAD (generalized anxiety disorder) 04/01/2024   Thyroid  nodule 01/10/2022   Anxiety 08/10/2021   Aortic atherosclerosis (HCC) - CT 06/2021 06/15/2021   Bilateral renal cysts 07/06/2020   Paroxysmal supraventricular tachycardia 10/20/2019   History of skin cancer (basal cell) 06/16/2019   Complete right bundle branch block 06/16/2019   B12 deficiency 06/15/2019   History of TIA (transient ischemic attack) 10/23/2017   Chronic kidney disease, stage 3b (HCC) 07/21/2015   Medication management 07/04/2014   Essential hypertension 11/25/2013   Hyperlipidemia, mixed 11/25/2013   Abnormal glucose 11/25/2013   Vitamin D  deficiency 11/25/2013   Essential tremor 11/25/2013   History of endometrial cancer    Hx/o DVT/PE    Urethral prolapse    Osteoporosis 10/10/2011   Past Medical History:  Diagnosis Date   Allergy    Medications and pollen   Anemia    Anxiety    Always   C. difficile colitis    Cancer (HCC)    Endometrial cancer   Cataract    Had surgery 2021   Chronic kidney disease 2018   2 siblings had kidney failure, sister had transplant   Clotting disorder    Essential and other specified forms of tremor    Hyperlipidemia    Hypertension    Osteoporosis 11/2019   T score -2.8 stable from prior DEXA   Personal history of venous thrombosis and embolism 1997   Pulmonary embolism (HCC) 06/14/2021   Pulmonary embolus (HCC)     Shingles    Stroke (HCC) 2018   TIA   Thrush    TIA (transient ischemic attack)    carotid US  07/2022: no ICA stenosis bilaterally   Family History  Problem Relation Age of Onset   Hypertension Mother    Cancer Mother        Unknown type   Dementia Mother    Varicose Veins Mother    Cancer Father        Bone   Heart disease Sister    Blindness Sister 57       temporal arteritis   Skin cancer Sister        basal and melanoma   Heart disease Sister    Kidney failure Sister        Kidney transplant   Diabetes Sister    Hypertension Sister    Kidney disease Sister    Vision loss Sister    Varicose Veins Sister    Heart disease Brother    Prostate cancer Brother        Prostate   Heart disease Brother    Kidney failure Brother    Prostate cancer Brother        Prostate   Heart disease Brother    Cancer Brother    Kidney disease Brother    Vision loss Brother    Tremor Neg Hx  Parkinson's disease Neg Hx    Past Surgical History:  Procedure Laterality Date   ABDOMINAL HYSTERECTOMY  1997   APPENDECTOMY  1961   cataract surg Bilateral 2021   Dr. Roz, R eye 8/18, L 8/25   CESAREAN SECTION  1961   EYE SURGERY  2021   Cataract both eyes   FRACTURE SURGERY  2025   Broken hip   INTRAMEDULLARY (IM) NAIL INTERTROCHANTERIC Left 04/28/2024   Procedure: FIXATION, FRACTURE, INTERTROCHANTERIC, WITH INTRAMEDULLARY ROD;  Surgeon: Kendal Franky SQUIBB, MD;  Location: [facility] OR;  Service: Orthopedics;  Laterality: Left;   TONSILLECTOMY AND ADENOIDECTOMY  1944   TOTAL ABDOMINAL HYSTERECTOMY W/ BILATERAL SALPINGOOPHORECTOMY  1997   Social History   Occupational History   Not on file  Tobacco Use   Smoking status: Never   Smokeless tobacco: Never  Vaping Use   Vaping status: Never Used  Substance and Sexual Activity   Alcohol  use: Not Currently   Drug use: Never    Types: Benzodiazepines    Comment: prescribed   Sexual activity: Not Currently    Birth control/protection:  Post-menopausal, Surgical    Comment: HYST-1st intercourse 89 yo-Fewer than 5 partners    Levelle Edelen Estela) Arlinda, M.D. [facility], Hand Surgery

## 2024-09-28 NOTE — Therapy (Signed)
 OUTPATIENT PHYSICAL THERAPY NEURO TREATMENT NOTE   Patient Name: Monica Curry MRN: [id number] DOB:02-Jul-1931, 89 y.o., female Today's Date: 09/29/2024   PCP: Wendolyn Jenkins Jansky, MD REFERRING PROVIDER: Danton Lauraine LABOR, PA-C   END OF SESSION:  PT End of Session - 09/29/24 1022     Visit Number 7    Number of Visits 17    Date for Recertification  10/29/24    Authorization Type [employer]    Authorization Time Period 08/30/2024-10/29/2024    Authorization - Visit Number 7    Authorization - Number of Visits 17    Progress Note Due on Visit 10    PT Start Time 0935    PT Stop Time 1016    PT Time Calculation (min) 41 min    Equipment Utilized During Treatment Gait belt    Activity Tolerance Patient tolerated treatment well    Behavior During Therapy WFL for tasks assessed/performed              Past Medical History:  Diagnosis Date   Allergy    Medications and pollen   Anemia    Anxiety    Always   C. difficile colitis    Cancer (HCC)    Endometrial cancer   Cataract    Had surgery 2021   Chronic kidney disease 2018   2 siblings had kidney failure, sister had transplant   Clotting disorder    Essential and other specified forms of tremor    Hyperlipidemia    Hypertension    Osteoporosis 11/2019   T score -2.8 stable from prior DEXA   Personal history of venous thrombosis and embolism 1997   Pulmonary embolism (HCC) 06/14/2021   Pulmonary embolus (HCC)    Shingles    Stroke (HCC) 2018   TIA   Thrush    TIA (transient ischemic attack)    carotid US  07/2022: no ICA stenosis bilaterally   Past Surgical History:  Procedure Laterality Date   ABDOMINAL HYSTERECTOMY  1997   APPENDECTOMY  1961   cataract surg Bilateral 2021   Dr. Roz, R eye 8/18, L 8/25   CESAREAN SECTION  1961   EYE SURGERY  2021   Cataract both eyes   FRACTURE SURGERY  2025   Broken hip   INTRAMEDULLARY (IM) NAIL INTERTROCHANTERIC Left 04/28/2024   Procedure: FIXATION,  FRACTURE, INTERTROCHANTERIC, WITH INTRAMEDULLARY ROD;  Surgeon: Kendal Franky SQUIBB, MD;  Location: [facility] OR;  Service: Orthopedics;  Laterality: Left;   TONSILLECTOMY AND ADENOIDECTOMY  1944   TOTAL ABDOMINAL HYSTERECTOMY W/ BILATERAL SALPINGOOPHORECTOMY  1997   Patient Active Problem List   Diagnosis Date Noted   RLS (restless legs syndrome) 06/15/2024   Fracture, Colles, left, closed 04/28/2024   Hip fracture (HCC) 04/27/2024   GAD (generalized anxiety disorder) 04/01/2024   Thyroid  nodule 01/10/2022   Anxiety 08/10/2021   Aortic atherosclerosis (HCC) - CT 06/2021 06/15/2021   Bilateral renal cysts 07/06/2020   Paroxysmal supraventricular tachycardia 10/20/2019   History of skin cancer (basal cell) 06/16/2019   Complete right bundle branch block 06/16/2019   B12 deficiency 06/15/2019   History of TIA (transient ischemic attack) 10/23/2017   Chronic kidney disease, stage 3b (HCC) 07/21/2015   Medication management 07/04/2014   Essential hypertension 11/25/2013   Hyperlipidemia, mixed 11/25/2013   Abnormal glucose 11/25/2013   Vitamin D  deficiency 11/25/2013   Essential tremor 11/25/2013   History of endometrial cancer    Hx/o DVT/PE    Urethral prolapse  Osteoporosis 10/10/2011    ONSET DATE: 04/28/2024  REFERRING DIAG: S/P L intertrachanteric femur fracture, Left distal radius fracture 04/28/2024   THERAPY DIAG:  Unsteadiness on feet  Other abnormalities of gait and mobility  Muscle weakness (generalized)  Rationale for Evaluation and Treatment: Rehabilitation  SUBJECTIVE:                                                                                                                                                                                             SUBJECTIVE STATEMENT: Saw the hand surgeon and will be having surgery but it is not scheduled yet.    Pt accompanied by: self  PERTINENT HISTORY: osteoporosis, hx of DVT; see additional PMH above  PAIN:  Are you  having pain? No   PRECAUTIONS: Fall, wears L compression stocking due to L LE edema (was negative for DVT)  RED FLAGS: None   WEIGHT BEARING RESTRICTIONS: No  FALLS: Has patient fallen in last 6 months? Yes. Number of falls 1  LIVING ENVIRONMENT: Lives with: lives alone Lives in: House/apartment Stairs: 3 steps to enter home;  Has following equipment at home: Single point cane and Walker - 2 wheeled *cane has tripod base PLOF: Independent  PATIENT GOALS: To be able to transition to the cane, so I can get in and out of home better.  OBJECTIVE:    TODAY'S TREATMENT: 09/29/24 Activity Comments  nustep L3 x 6 min UEs/LEs Dynamic warm up   Gait training with quad tip cane 137ft Good stability; slight R hand tremor when holding cane but otherwise quite steady  stair navigation With single and B rail and step-through pattern, then with step-to leading with R LE with cane. Great stability without handrail   Low seat STS 5x Without UE support, cues to scoot to edge   Low seat STS with L foot back  5x Without UE support; cueing to extend knees fully         PATIENT EDUCATION: Education details: answered pt's questions on where to get quad tip cane, handout on getting quad tip cane; advised pt is safe to use cane inside the house Person educated: Patient Education method: Explanation, Demonstration, Tactile cues, Verbal cues, and Handouts Education comprehension: verbalized understanding and returned demonstration    Access Code: [email or web address] URL: [email or web address] Date: 09/09/2024 Prepared by: [facility]  Program Notes (per pt report 10/20-using 3# at home) Marching in place, side hip kicks, back hip kicks:  Do 3 sets of 10 with 2# weight  Exercises - Backward Walking with Counter Support  - 1 x daily - 5  x weekly - 1 sets - 5 reps - Alternating Step Taps with Counter Support  - 1 x daily - 5 x weekly - 3 sets - 10 reps -  Side Stepping with Counter Support  - 1 x daily - 5 x weekly - 1 sets - 5 reps   -------------------------------------------- Note: Objective measures were completed at Evaluation unless otherwise noted.  DIAGNOSTIC FINDINGS: Interval proximal left femoral cephalomedullary nail fixation of the previously seen intertrochanteric fracture. Improved alignment.  COGNITION: Overall cognitive status: Within functional limits for tasks assessed   SENSATION: Reports decreased sensation in L fingers (to see hand surgeon)  EDEMA:  Noted LLE; wearing compression stocking  POSTURE: rounded shoulders and increased thoracic kyphosis  LOWER EXTREMITY ROM:     Active  Right Eval Left Eval  Hip flexion    Hip extension    Hip abduction    Hip adduction    Hip internal rotation    Hip external rotation    Knee flexion    Knee extension    Ankle dorsiflexion  10  Ankle plantarflexion  40  Ankle inversion    Ankle eversion     (Blank rows = not tested)  LOWER EXTREMITY MMT:    MMT Right Eval Left Eval  Hip flexion 4 3+  Hip extension    Hip abduction 5 4  Hip adduction 5 4  Hip internal rotation    Hip external rotation    Knee flexion 4+ 4+  Knee extension 4+ 4  Ankle dorsiflexion 4+ 4  Ankle plantarflexion    Ankle inversion    Ankle eversion    (Blank rows = not tested)  TRANSFERS: Sit to stand: Modified independence  Assistive device utilized: None     Stand to sit: Modified independence  Assistive device utilized: hands at chair      STAIRS: Findings: Level of Assistance: Modified independence, Stair Negotiation Technique: Step to Pattern with Single Rail on Right, Number of Stairs: 2-3, Height of Stairs: 4-6   , and Comments: prior to fracture, pt negotiated steps alternating pattern GAIT: Findings: Gait Characteristics: step through pattern, decreased step length- Right, and decreased stance time- Left, Distance walked: 50 ft x 2, Assistive device utilized:Walker -  2 wheeled and SPC with small quad tip base, Level of assistance: Modified independence, CGA, and Min A, and Comments: Mod I with RW, CGA/min assist with cane  FUNCTIONAL TESTS:  10 meter walk test: 16.78 sec RW (1.95 ft/sec); 33.62 sec with cane (0.98 ft/sec) Berg:  34/56 (Scores <45/56 indicate increased fall risk)                                                                                                                                TREATMENT DATE: 08/30/2024    PATIENT EDUCATION: Education details: Eval results, POC, continue HEP from HHPT; verbally added lateral weightshifting to LLE, with attention to light UE support Person educated: Patient  Education method: Explanation, Demonstration, and Verbal cues Education comprehension: verbalized understanding, returned demonstration, and needs further education  HOME EXERCISE PROGRAM: Verbally initiated lateral weightshifting; pt doing standing hip kicks, heel/toe raises, marching and sit to stand at home  GOALS: Goals reviewed with patient? Yes  SHORT TERM GOALS: Target date: 10/01/2024  Pt will be independent with HEP for improved gait, stair negotiation, balance. Baseline: no concerns on HEP  09/29/24 Goal status: IN PROGRESS 09/29/24  2.  Pt will improve Berg score to at least 45/56 to decrease fall risk. Baseline: 34/56>44/56 09/27/2024 Goal status: PARTIALLY MET 09/27/2024  3.  Pt will negotiate curb and 3 steps with cane, mod I, for improved household and community gait. Baseline: stairs with cane with CGA  09/29/24 Goal status: IN PROGRESS  09/29/24   LONG TERM GOALS: Target date: 10/29/2024  Pt will be independent with HEP for improved balance, strength, gait, stairs. Baseline:  Goal status: IN PROGRESS  2.  Pt will improve DGI score to at least 20/24 to decrease fall risk. Baseline: TBD Goal status:IN PROGRESS  3.  Pt will improve gait velocity to at least 2.6 ft/sec with cane for improved gait  efficiency and safety. Baseline: 0.98 ft/sec Goal status: IN PROGRESS  4.  Pt will ambulate at least 500 ft, indoor and outdoor gait, mod I with cane for improved community mobility. Baseline:  Goal status: IN PROGRESS   ASSESSMENT:  CLINICAL IMPRESSION: Patient arrived to session with report of planning for wrist/hand surgery for nerve compression. Session focused on gait training and stair navigation with quad tip cane. Patient required cueing and edu on proper sequencing on stairs, but otherwise patient appeared very safe and steady. Advised patient was ok to start practicing using the cane inside the house. Patient reported understanding and without complaints upon leaving.   OBJECTIVE IMPAIRMENTS: Abnormal gait, decreased balance, decreased mobility, difficulty walking, and decreased strength.   ACTIVITY LIMITATIONS: standing, stairs, transfers, and locomotion level  PARTICIPATION LIMITATIONS: meal prep, cleaning, driving, shopping, community activity, yard work, and church  PERSONAL FACTORS: 3+ comorbidities: see PMH above are also affecting patient's functional outcome.   REHAB POTENTIAL: Good  CLINICAL DECISION MAKING: Evolving/moderate complexity  EVALUATION COMPLEXITY: Moderate  PLAN:  PT FREQUENCY: 2x/week  PT DURATION: 8 weeks plus eval visit  PLANNED INTERVENTIONS: 97750- Physical Performance Testing, 97110-Therapeutic exercises, 97530- Therapeutic activity, [id number]- Neuromuscular re-education, 97535- Self Care, [id number]- Manual therapy, [id number]- Gait training, Patient/Family education, Balance training, Stair training, and DME instructions  PLAN FOR NEXT SESSION:  how is gait with cane going inside? Review  HEP  and progress.  Work on ankle weight progression.  (to address LLE strength, SLS, balance)-at counter, as she would have at home. *Needs to practice putting walker in the car, steps with cane, indoor gait with cane.  Ask about MD visit/potential for L hand  surgery/needs to schedule more PT appts    Louana Terrilyn Christians, PT, DPT 09/29/24 10:27 AM  [facility] at [facility] [address] [place], [address] [place], [place] [address] Phone # [phone] Fax # [phone]

## 2024-09-29 ENCOUNTER — Ambulatory Visit: Admitting: Physical Therapy

## 2024-09-29 ENCOUNTER — Ambulatory Visit: Admitting: Occupational Therapy

## 2024-09-29 ENCOUNTER — Encounter: Payer: Self-pay | Admitting: Physical Therapy

## 2024-09-29 DIAGNOSIS — R41840 Attention and concentration deficit: Secondary | ICD-10-CM | POA: Diagnosis not present

## 2024-09-29 DIAGNOSIS — R278 Other lack of coordination: Secondary | ICD-10-CM | POA: Diagnosis not present

## 2024-09-29 DIAGNOSIS — R2681 Unsteadiness on feet: Secondary | ICD-10-CM | POA: Diagnosis not present

## 2024-09-29 DIAGNOSIS — R208 Other disturbances of skin sensation: Secondary | ICD-10-CM | POA: Diagnosis not present

## 2024-09-29 DIAGNOSIS — M6281 Muscle weakness (generalized): Secondary | ICD-10-CM

## 2024-09-29 DIAGNOSIS — R2689 Other abnormalities of gait and mobility: Secondary | ICD-10-CM | POA: Diagnosis not present

## 2024-09-29 DIAGNOSIS — M25632 Stiffness of left wrist, not elsewhere classified: Secondary | ICD-10-CM | POA: Diagnosis not present

## 2024-09-29 DIAGNOSIS — R41842 Visuospatial deficit: Secondary | ICD-10-CM | POA: Diagnosis not present

## 2024-09-29 NOTE — Patient Instructions (Signed)
To go up stairs or curb with cane: R leg ? L leg ? cane   To go down stairs or curb with cane:  Cane ? L leg ? R leg  

## 2024-10-04 ENCOUNTER — Ambulatory Visit: Admitting: Family Medicine

## 2024-10-04 ENCOUNTER — Other Ambulatory Visit (HOSPITAL_COMMUNITY)
Admission: RE | Admit: 2024-10-04 | Discharge: 2024-10-04 | Disposition: A | Discharge status: Home / Self Care | Source: Ambulatory Visit | Attending: Family Medicine | Admitting: Family Medicine

## 2024-10-04 ENCOUNTER — Ambulatory Visit: Payer: Self-pay | Admitting: Family Medicine

## 2024-10-04 ENCOUNTER — Encounter: Payer: Self-pay | Admitting: Family Medicine

## 2024-10-04 ENCOUNTER — Telehealth: Payer: Self-pay | Admitting: Family Medicine

## 2024-10-04 VITALS — BP 146/70 | HR 84 | Temp 97.8°F | Ht 65.0 in | Wt 140.0 lb

## 2024-10-04 DIAGNOSIS — G2581 Restless legs syndrome: Secondary | ICD-10-CM | POA: Diagnosis not present

## 2024-10-04 DIAGNOSIS — N898 Other specified noninflammatory disorders of vagina: Secondary | ICD-10-CM

## 2024-10-04 DIAGNOSIS — E559 Vitamin D deficiency, unspecified: Secondary | ICD-10-CM

## 2024-10-04 DIAGNOSIS — F411 Generalized anxiety disorder: Secondary | ICD-10-CM | POA: Diagnosis not present

## 2024-10-04 DIAGNOSIS — I10 Essential (primary) hypertension: Secondary | ICD-10-CM | POA: Diagnosis not present

## 2024-10-04 DIAGNOSIS — Z113 Encounter for screening for infections with a predominantly sexual mode of transmission: Secondary | ICD-10-CM | POA: Insufficient documentation

## 2024-10-04 DIAGNOSIS — E782 Mixed hyperlipidemia: Secondary | ICD-10-CM

## 2024-10-04 DIAGNOSIS — E538 Deficiency of other specified B group vitamins: Secondary | ICD-10-CM

## 2024-10-04 DIAGNOSIS — R300 Dysuria: Secondary | ICD-10-CM | POA: Diagnosis not present

## 2024-10-04 DIAGNOSIS — Z23 Encounter for immunization: Secondary | ICD-10-CM

## 2024-10-04 DIAGNOSIS — Z8673 Personal history of transient ischemic attack (TIA), and cerebral infarction without residual deficits: Secondary | ICD-10-CM

## 2024-10-04 LAB — POC URINALSYSI DIPSTICK (AUTOMATED)
Bilirubin, UA: NEGATIVE
Blood, UA: NEGATIVE
Clarity, UA: NORMAL
Color, UA: NORMAL
Glucose, UA: NEGATIVE
Ketones, UA: NEGATIVE
Leukocytes, UA: NEGATIVE
Nitrite, UA: NEGATIVE
Protein, UA: NEGATIVE
Spec Grav, UA: 1.02 (ref 1.010–1.025)
Urobilinogen, UA: 0.2 U/dL
pH, UA: 6 (ref 5.0–8.0)

## 2024-10-04 LAB — COMPREHENSIVE METABOLIC PANEL WITH GFR
ALT: 12 U/L (ref 0–35)
AST: 16 U/L (ref 0–37)
Albumin: 4.4 g/dL (ref 3.5–5.2)
Alkaline Phosphatase: 44 U/L (ref 39–117)
BUN: 23 mg/dL (ref 6–23)
CO2: 28 meq/L (ref 19–32)
Calcium: 9.3 mg/dL (ref 8.4–10.5)
Chloride: 102 meq/L (ref 96–112)
Creatinine, Ser: 1.03 mg/dL (ref 0.40–1.20)
GFR: 47.64 mL/min — ABNORMAL LOW (ref >60.00)
Glucose, Bld: 88 mg/dL (ref 70–99)
Potassium: 5 meq/L (ref 3.5–5.1)
Sodium: 137 meq/L (ref 135–145)
Total Bilirubin: 0.9 mg/dL (ref 0.2–1.2)
Total Protein: 6.6 g/dL (ref 6.0–8.3)

## 2024-10-04 LAB — CBC WITH DIFFERENTIAL/PLATELET
Basophils Absolute: 0.1 K/uL (ref 0.0–0.1)
Basophils Relative: 1.8 % (ref 0.0–3.0)
Eosinophils Absolute: 0.3 K/uL (ref 0.0–0.7)
Eosinophils Relative: 4.6 % (ref 0.0–5.0)
HCT: 47.9 % — ABNORMAL HIGH (ref 36.0–46.0)
Hemoglobin: 15.4 g/dL — ABNORMAL HIGH (ref 12.0–15.0)
Lymphocytes Relative: 33.6 % (ref 12.0–46.0)
Lymphs Abs: 2.5 K/uL (ref 0.7–4.0)
MCHC: 32.2 g/dL (ref 30.0–36.0)
MCV: 80.8 fl (ref 78.0–100.0)
Monocytes Absolute: 0.5 K/uL (ref 0.1–1.0)
Monocytes Relative: 7 % (ref 3.0–12.0)
Neutro Abs: 4 K/uL (ref 1.4–7.7)
Neutrophils Relative %: 53 % (ref 43.0–77.0)
Platelets: 517 K/uL — ABNORMAL HIGH (ref 150.0–400.0)
RBC: 5.93 Mil/uL — ABNORMAL HIGH (ref 3.87–5.11)
RDW: 15.1 % (ref 11.5–15.5)
WBC: 7.5 K/uL (ref 4.0–10.5)

## 2024-10-04 LAB — IBC + FERRITIN
Ferritin: 16.4 ng/mL (ref 10.0–291.0)
Iron: 72 ug/dL (ref 42–145)
Saturation Ratios: 17.7 % — ABNORMAL LOW (ref 20.0–50.0)
TIBC: 407.4 ug/dL (ref 250.0–450.0)
Transferrin: 291 mg/dL (ref 212.0–360.0)

## 2024-10-04 LAB — TSH: TSH: 2.9 u[IU]/mL (ref 0.35–5.50)

## 2024-10-04 LAB — LIPID PANEL
Cholesterol: 117 mg/dL (ref 0–200)
HDL: 48 mg/dL (ref >39.00)
LDL Cholesterol: 51 mg/dL (ref 0–99)
NonHDL: 69.07
Total CHOL/HDL Ratio: 2
Triglycerides: 92 mg/dL (ref 0.0–149.0)
VLDL: 18.4 mg/dL (ref 0.0–40.0)

## 2024-10-04 LAB — VITAMIN B12: Vitamin B-12: 599 pg/mL (ref 211–911)

## 2024-10-04 LAB — VITAMIN D 25 HYDROXY (VIT D DEFICIENCY, FRACTURES): VITD: >120 ng/mL

## 2024-10-04 MED ORDER — ESCITALOPRAM OXALATE 5 MG PO TABS: 5.0000 mg | ORAL_TABLET | Freq: Every day | ORAL | 1 refills | Status: AC

## 2024-10-04 NOTE — Progress Notes (Signed)
 Subjective:     Patient ID: Monica Curry, female    DOB: 06/10/1931, 89 y.o.   MRN: [id number]  Chief Complaint  Patient presents with   Medical Management of Chronic Issues    10mo follow up; want to discuss the swelling in the L foot and leg; still having restless legs (increase meds?) Also want to discuss vaginal odor x2wks; no pain, no burning, no discharge, just noticing take a shower in the morning and by evening there is an odor.    Discussed the use of AI scribe software for clinical note transcription with the patient, who gave verbal consent to proceed.  History of Present Illness Monica Curry is a 89 year old female who presents for follow-up on her recovery and management of multiple health issues.  She is transitioning from using a walker to a cane for mobility after sustaining L hip and wrist fx on 04/27/24. She is currently unable to go to the basement to wash clothes but is practicing using the cane on stairs with handrails. She lives alone and has been managing independently since August 10th after her daughter left. She has been receiving physical therapy at home, which she finds beneficial.  She experiences numbness in all fingers of her left hand and is scheduled for surgery on her hand with Dr. Erwin. The surgery will involve a small incision in the palm, and she plans to have it under local anesthesia without sedation. Recovery is expected to take about two weeks. Needs clearance  She experiences occasional shortness of breath, particularly when lying down at night, but not during physical activities like walking around the house or climbing stairs. She monitors her blood pressure at home, which runs slightly higher than the reading taken during the visit. She takes propranolol  40 mg four times a day for blood pressure management but mostly tremors.  She is on several medications including Prolia  every six months, atorvastatin  10 mg for cholesterol, Eliquis  5 mg twice  a day for blood clots, and Mirapex  0.125 mg for restless legs syndrome. She has been experimenting with the timing of Mirapex  intake based on her brother's advice but still experiences symptoms of restless legs, particularly at night.  She has swelling in her left leg, which has persisted since her hip fracture. She wears compression stockings, but the swelling does not subside overnight. She has a history of pulmonary embolism but no known blood clots in the leg. The swelling is described as tight and sometimes painful, and she massages it for relief.  She reports a persistent vaginal odor that develops by the end of the day despite showering. She does not experience itching or urgency associated with a urinary tract infection and has not noticed any discharge. She has been using plain soap and water for hygiene.  She takes vitamin D  and B12 supplements, having adjusted her vitamin D  intake after previous high levels. She also takes escitalopram  for mood, which she reports is stable.    Health Maintenance Due  Topic Date Due   DTaP/Tdap/Td (2 - Tdap) 03/24/2023   COVID-19 Vaccine (6 - 2025-26 season) 08/09/2024    Past Medical History:  Diagnosis Date   Allergy    Medications and pollen   Anemia    Anxiety    Always   C. difficile colitis    Cancer [facility])    Endometrial cancer   Cataract    Had surgery 2021   Chronic kidney disease 2018   2 siblings had  kidney failure, sister had transplant   Clotting disorder    Essential and other specified forms of tremor    Hyperlipidemia    Hypertension    Osteoporosis 11/2019   T score -2.8 stable from prior DEXA   Personal history of venous thrombosis and embolism 1997   Pulmonary embolism (HCC) 06/14/2021   Pulmonary embolus (HCC)    Shingles    Stroke (HCC) 2018   TIA   Thrush    TIA (transient ischemic attack)    carotid US  07/2022: no ICA stenosis bilaterally    Past Surgical History:  Procedure Laterality Date   ABDOMINAL  HYSTERECTOMY  1997   APPENDECTOMY  1961   cataract surg Bilateral 2021   Dr. Roz, R eye 8/18, L 8/25   CESAREAN SECTION  1961   EYE SURGERY  2021   Cataract both eyes   FRACTURE SURGERY  2025   Broken hip   INTRAMEDULLARY (IM) NAIL INTERTROCHANTERIC Left 04/28/2024   Procedure: FIXATION, FRACTURE, INTERTROCHANTERIC, WITH INTRAMEDULLARY ROD;  Surgeon: Kendal Franky SQUIBB, MD;  Location: [facility] OR;  Service: Orthopedics;  Laterality: Left;   TONSILLECTOMY AND ADENOIDECTOMY  1944   TOTAL ABDOMINAL HYSTERECTOMY W/ BILATERAL SALPINGOOPHORECTOMY  1997     Current Outpatient Medications:    acetaminophen  (TYLENOL ) 500 MG tablet, Take 500 mg by mouth every 6 (six) hours as needed for moderate pain., Disp: , Rfl:    apixaban  (ELIQUIS ) 5 MG TABS tablet, Take 1 tablet (5 mg total) by mouth 2 (two) times daily., Disp: 180 tablet, Rfl: 3   atorvastatin  (LIPITOR) 10 MG tablet, TAKE 1 TABLET BY MOUTH DAILY FOR CHOLESTEROL, Disp: 90 tablet, Rfl: 3   Calcium  Carb-Cholecalciferol  (CALCIUM  PLUS VITAMIN D  PO), Take 1 tablet by mouth daily. 1 chewable daily, Disp: , Rfl:    Cyanocobalamin  500 MCG CHEW, Chew 1 tablet by mouth daily., Disp: , Rfl:    hydrOXYzine  (ATARAX ) 25 MG tablet, Take 1 tablet (25 mg total) by mouth daily., Disp: 90 tablet, Rfl: 1   pramipexole  (MIRAPEX ) 0.125 MG tablet, Take 1 tablet (0.125 mg total) by mouth at bedtime., Disp: 90 tablet, Rfl: 1   propranolol  (INDERAL ) 40 MG tablet, Take 1 tablet (40 mg total) by mouth 4 (four) times daily. TAKE 1 TABLET BY MOUTH FOUR TIMES DAILY AT BEDTIME WITH MEALS FOR BLOOD PRESSURE OR TREMORS, Disp: 360 tablet, Rfl: 3   VITAMIN D  PO, Take 5,000 Units by mouth daily. 2 tablets daily, Disp: , Rfl:    escitalopram  (LEXAPRO ) 5 MG tablet, Take 1 tablet (5 mg total) by mouth daily., Disp: 90 tablet, Rfl: 1  Current Facility-Administered Medications:    denosumab  (PROLIA ) injection 60 mg, 60 mg, Subcutaneous, Q6 months, Wendolyn Jenkins Jansky, MD, 60 mg at 07/15/24  1524   denosumab  (PROLIA ) injection 60 mg, 60 mg, Subcutaneous, Once, Wendolyn Jenkins Jansky, MD   denosumab  (PROLIA ) injection 60 mg, 60 mg, Subcutaneous, Q6 months, Wendolyn Jenkins Jansky, MD  Allergies  Allergen Reactions   Codeine Hives   Other Other (See Comments)    Reaction: Fever (intolerance)   Cortisone     Causes jitteriness.   Dexamethasone      anxiety   Hctz [Hydrochlorothiazide ] Other (See Comments)    Low sodium   Meloxicam  Nausea Only    Gastritis and nausea   Mysoline [Primidone]    Pneumovax 23 [Pneumococcal Vac Polyvalent]     Red, raised area.  Knot at site of injection   Prednisone      Anxiety  Topiramate      Sleepiness, numbness feet tingling in arm.   Toradol  [Ketorolac  Tromethamine ] Swelling    Localized redness at injection site.     Ace Inhibitors Cough   ROS neg/noncontributory except as noted HPI/below      Objective:     BP (!) 146/70   Pulse 84   Temp 97.8 F (36.6 C)   Ht 5' 5 (1.651 m)   Wt 140 lb (63.5 kg)   SpO2 98%   BMI 23.30 kg/m  Wt Readings from Last 3 Encounters:  10/04/24 140 lb (63.5 kg)  08/31/24 138 lb (62.6 kg)  06/15/24 138 lb 2 oz (62.7 kg)    Physical Exam VITALS: BP- 146/70 GENERAL: Well developed, well nourished, no acute distress. HEAD EYES EARS NOSE THROAT: Normocephalic, atraumatic, conjunctiva not injected, sclera nonicteric. CARDIAC: Regular rate and rhythm, S1 S2 present, no murmur, dorsalis pedis 2 plus bilaterally, left leg swollen. NECK: Supple, no thyromegaly, no nodes, no carotid bruits. LUNGS: Clear to auscultation bilaterally, no wheezes. ABDOMEN: Bowel sounds present, soft, non-tender, non-distended, no hepatosplenomegaly, no masses. EXTREMITIES: No edema. MUSCULOSKELETAL: No gross abnormalities. Using walker NEUROLOGICAL: Alert and oriented x3, cranial nerves II through XII intact. PSYCHIATRIC: Normal mood, good eye contact.   UA neg.  Cx sent.  Aptima sent    Assessment & Plan:  Essential  hypertension -     CBC with Differential/Platelet -     Comprehensive metabolic panel with GFR -     TSH  Hyperlipidemia, mixed -     Comprehensive metabolic panel with GFR -     Lipid panel -     TSH  History of TIA (transient ischemic attack)  RLS (restless legs syndrome) -     CBC with Differential/Platelet -     Comprehensive metabolic panel with GFR -     TSH -     IBC + Ferritin  Vitamin D  deficiency -     VITAMIN D  25 Hydroxy (Vit-D Deficiency, Fractures)  B12 deficiency -     Vitamin B12  Vaginal odor -     POCT Urinalysis Dipstick (Automated) -     Urine Culture -     Cervicovaginal ancillary only  GAD (generalized anxiety disorder) -     Escitalopram  Oxalate; Take 1 tablet (5 mg total) by mouth daily.  Dispense: 90 tablet; Refill: 1  Encounter for immunization -     Flu vaccine HIGH DOSE PF(Fluzone Trivalent)    Assessment and Plan Assessment & Plan Left hand carpal tunnel syndrome/h/o fx/nerve impingement   Numbness and muscle atrophy, especially near the thumb, are worsening. Proceed with carpal tunnel surgery under local anesthesia with Dr. Erwin. Stop Eliquis  a day before surgery to minimize bleeding risk. RCRI 0.9%-proceed w/surgery  Restless legs syndrome   Leg jerking and jumping occur primarily at night and occasionally during the day. Symptoms persist despite Mirapex . Gabapentin  was ineffective and caused dizziness. Concerns exist about rebound symptoms with higher Mirapex  doses. Continue Mirapex  0.125 mg, consider increasing to 2 tablets at bedtime if symptoms persist. Monitor symptoms and consider further evaluation if no improvement. Check iron labs  Chronic left leg swelling, likely venous insufficiency   Swelling persists despite compression stockings and leg elevation. No deep vein thrombosis is evident. Symptoms include tightness and discomfort, but not pain. Continue compression stockings and leg elevation. Consider referral to vascular  specialists if symptoms worsen.  Hypertension   Managed with propranolol  40 mg four times daily. Home blood pressure  readings are slightly higher than in-office, with a recent measurement of 146/70 mmHg considered acceptable. Continue propranolol  40 mg four times daily. Monitor blood pressure at home regularly.  Hyperlipidemia   Managed with atorvastatin  10 mg daily. Continue atorvastatin  10 mg daily.  Depression   Mood is well-managed with escitalopram . Continue escitalopram  as prescribed.  Osteoporosis   Managed with Prolia  injections every six months. Next injection is due in January. Administer Prolia  injection in January.  Vaginal odor, possible bacterial vaginosis   Vaginal odor may be due to bacterial vaginosis. No itching or urinary symptoms reported. Perform self-swab for bacterial vaginosis. Check urine sample for infection.  General Health Maintenance   Blood work is planned to assess vitamin levels and overall health. Perform blood work to assess vitamin D , vitamin B12, and iron levels. Schedule routine follow-up as needed.     Return in about 6 months (around 04/04/2025) for chronic follow-up, annual physical.  Jenkins CHRISTELLA Carrel, MD

## 2024-10-04 NOTE — Telephone Encounter (Signed)
 CRITICAL VALUE STICKER  CRITICAL VALUE: Vitamin D  >120  RECEIVER (on-site recipient of call): Waddell Salles  DATE & TIME NOTIFIED: 10/04/2024 1442  MESSENGER (representative from lab): Rea at [place] lab   MD NOTIFIED: Wendolyn   TIME OF NOTIFICATION: 1457  RESPONSE: Dr. Wendolyn stated she is aware and this will addressed with the rest of her labs. No further action at this time.

## 2024-10-04 NOTE — Patient Instructions (Signed)

## 2024-10-04 NOTE — Progress Notes (Signed)
 Labs ok except  Vitamin D  way too high-stop taking all vitamin D  Platelets elevated and iron stores low-which may cause it.  Take iron 325mg  twice weekly-may need to take a stool softener since may cause constipation

## 2024-10-05 ENCOUNTER — Ambulatory Visit: Admitting: Occupational Therapy

## 2024-10-05 ENCOUNTER — Ambulatory Visit: Admitting: Physical Therapy

## 2024-10-05 ENCOUNTER — Encounter: Payer: Self-pay | Admitting: Physical Therapy

## 2024-10-05 DIAGNOSIS — R41840 Attention and concentration deficit: Secondary | ICD-10-CM | POA: Diagnosis not present

## 2024-10-05 DIAGNOSIS — R2689 Other abnormalities of gait and mobility: Secondary | ICD-10-CM

## 2024-10-05 DIAGNOSIS — R2681 Unsteadiness on feet: Secondary | ICD-10-CM | POA: Diagnosis not present

## 2024-10-05 DIAGNOSIS — M6281 Muscle weakness (generalized): Secondary | ICD-10-CM

## 2024-10-05 DIAGNOSIS — R208 Other disturbances of skin sensation: Secondary | ICD-10-CM | POA: Diagnosis not present

## 2024-10-05 DIAGNOSIS — M25632 Stiffness of left wrist, not elsewhere classified: Secondary | ICD-10-CM | POA: Diagnosis not present

## 2024-10-05 DIAGNOSIS — R278 Other lack of coordination: Secondary | ICD-10-CM | POA: Diagnosis not present

## 2024-10-05 DIAGNOSIS — R41842 Visuospatial deficit: Secondary | ICD-10-CM | POA: Diagnosis not present

## 2024-10-05 LAB — CERVICOVAGINAL ANCILLARY ONLY
Bacterial Vaginitis (gardnerella): NEGATIVE
Candida Glabrata: NEGATIVE
Candida Vaginitis: NEGATIVE
Comment: NEGATIVE
Comment: NEGATIVE
Comment: NEGATIVE
Comment: NEGATIVE
Trichomonas: NEGATIVE

## 2024-10-05 LAB — URINE CULTURE
MICRO NUMBER:: 17151397
SPECIMEN QUALITY:: ADEQUATE

## 2024-10-05 NOTE — Progress Notes (Signed)
 Cardiology Office Note:  .   Date:  10/19/2024  ID:  Monica Curry, DOB Sep 18, 1931, MRN [id number] PCP: Wendolyn Jenkins Jansky, MD  [facility] Providers Cardiologist:  Vina Gull, MD    History of Present Illness: .   Monica Curry is a 89 y.o. female  with a history of a TIA, HTN,  SVT (found on event monitor; placed on diltiazem) CKD stage III, hx of pulmonary embolism, DVT, essential tremors and presyncope  In May 2022 admitted for another PE.   Patient comes in alone. She fell and broke her hip/arm in May. She had surgery on her hip and then had carpal tunnel surgery last week. She was in rehab at [facility] but at home now. Walking with a cane. When she gets in bed at night she has some chest pressure that goes away within minutes with relaxing. When she's  moving around a lot during the day she has chest pressure when she stops but not during activity. This has been going on since July. No dyspnea, palpitations. Has some left LE swelling since hip fracture. BP up mildly on arrival but came down. She says it fluctuates at home.    ROS:    Studies Reviewed: SABRA         Prior CV Studies:   Carotid USN  07/2022  Right Carotid: There is no evidence of stenosis in the right ICA. The                 extracranial vessels were near-normal with only minimal  wall thickening or plaque.   Left Carotid: There is no evidence of stenosis in the left ICA. The  extracranial vessels were near-normal with only minimal wall thickening  or plaque.   Vertebrals:  Bilateral vertebral arteries demonstrate antegrade flow.  Subclavians: Normal flow hemodynamics were seen in bilateral subclavian               arteries.      Stress test 04/2018 Nuclear stress EF: 76%. There was no ST segment deviation noted during stress. No T wave inversion was noted during stress. The study is normal. This is a low risk study. The left ventricular ejection fraction is hyperdynamic (>65%).   Low risk  stress nuclear study with normal perfusion and normal left ventricular regional and global systolic function.   Monitor 01/2018 NSR with sinus bradycardia 2. Atrial tachycardia at 160 is present 3. Rare PVC's 4. Noise artifact   Echo 10/2017 Study Conclusions   - Left ventricle: The cavity size was normal. Systolic function was   normal. The estimated ejection fraction was in the range of 55%   to 60%. Wall motion was normal; there were no regional wall   motion abnormalities. Doppler parameters are consistent with   abnormal left ventricular relaxation (grade 1 diastolic   dysfunction). - Mitral valve: There was mild regurgitation. - Left atrium: The atrium was moderately dilated.   Impressions:   - No cardiac source of emboli was indentified.    Risk Assessment/Calculations:             Physical Exam:   VS:  BP 110/70   Pulse 68   Ht 5' 3.5 (1.613 m)   Wt 133 lb 3.2 oz (60.4 kg)   SpO2 96%   BMI 23.23 kg/m    Orhtostatics: No data found. Wt Readings from Last 3 Encounters:  10/19/24 133 lb 3.2 oz (60.4 kg)  10/04/24 140 lb (63.5 kg)  08/31/24 138  lb (62.6 kg)    GEN: Well nourished, well developed in no acute distress NECK: No JVD; No carotid bruits CARDIAC:  RRR,S4  1/6 systolic murmur LSB RESPIRATORY:  Clear to auscultation without rales, wheezing or rhonchi  ABDOMEN: Soft, non-tender, non-distended EXTREMITIES:  trace right ankle edema; No deformity   ASSESSMENT AND PLAN: .    Chest pain-tightness after an activity that relieves with rest. Normal NST 2019. RBBB/LAFB(old) on EKG. Recommend lexiscan  to rule out ischemia. ER precautions reviewed. Reluctant to start any meds with lower BP today and recent fall.   HTN  BP is controlled here but fluctuating and running higher at home. She will take her cuff to next appt to see if accurate.    HLD    on lipitor  LDL 51 10/04/24    Hx SVT  Denies palpitations        Hx of PE  Continue Eliquis   labs stable        Informed Consent   Shared Decision Making/Informed Consent The risks [chest pain, shortness of breath, cardiac arrhythmias, dizziness, blood pressure fluctuations, myocardial infarction, stroke/transient ischemic attack, nausea, vomiting, allergic reaction, radiation exposure, metallic taste sensation and life-threatening complications (estimated to be 1 in 10,000)], benefits (risk stratification, diagnosing coronary artery disease, treatment guidance) and alternatives of a nuclear stress test were discussed in detail with Ms. Chisenhall and she agrees to proceed.     Dispo: f/u Dr. Okey in 6 months or sooner pending stress test.  Signed, Olivia Pavy, PA-C

## 2024-10-05 NOTE — Therapy (Signed)
 OUTPATIENT PHYSICAL THERAPY NEURO TREATMENT NOTE   Patient Name: Monica Curry MRN: [id number] DOB:[birth year], 89 y.o., female Today's Date: 10/05/2024   PCP: Wendolyn Jenkins Jansky, MD REFERRING PROVIDER: Danton Lauraine LABOR, PA-C   END OF SESSION:  PT End of Session - 10/05/24 1019     Visit Number 8    Number of Visits 17    Date for Recertification  10/29/24    Authorization Type [employer]    Authorization Time Period 08/30/2024-10/29/2024    Authorization - Visit Number 8    Authorization - Number of Visits 17    Progress Note Due on Visit 10    PT Start Time 1019    PT Stop Time 1100    PT Time Calculation (min) 41 min    Equipment Utilized During Treatment Gait belt    Activity Tolerance Patient tolerated treatment well    Behavior During Therapy WFL for tasks assessed/performed               Past Medical History:  Diagnosis Date   Allergy    Medications and pollen   Anemia    Anxiety    Always   C. difficile colitis    Cancer (HCC)    Endometrial cancer   Cataract    Had surgery 2021   Chronic kidney disease 2018   2 siblings had kidney failure, sister had transplant   Clotting disorder    Essential and other specified forms of tremor    Hyperlipidemia    Hypertension    Osteoporosis 11/2019   T score -2.8 stable from prior DEXA   Personal history of venous thrombosis and embolism 1997   Pulmonary embolism (HCC) 06/14/2021   Pulmonary embolus (HCC)    Shingles    Stroke (HCC) 2018   TIA   Thrush    TIA (transient ischemic attack)    carotid US  07/2022: no ICA stenosis bilaterally   Past Surgical History:  Procedure Laterality Date   ABDOMINAL HYSTERECTOMY  1997   APPENDECTOMY  1961   cataract surg Bilateral 2021   Dr. Roz, R eye 8/18, L 8/25   CESAREAN SECTION  1961   EYE SURGERY  2021   Cataract both eyes   FRACTURE SURGERY  2025   Broken hip   INTRAMEDULLARY (IM) NAIL INTERTROCHANTERIC Left 04/28/2024   Procedure: FIXATION,  FRACTURE, INTERTROCHANTERIC, WITH INTRAMEDULLARY ROD;  Surgeon: Kendal Franky SQUIBB, MD;  Location: [facility] OR;  Service: Orthopedics;  Laterality: Left;   TONSILLECTOMY AND ADENOIDECTOMY  1944   TOTAL ABDOMINAL HYSTERECTOMY W/ BILATERAL SALPINGOOPHORECTOMY  1997   Patient Active Problem List   Diagnosis Date Noted   RLS (restless legs syndrome) 06/15/2024   Fracture, Colles, left, closed 04/28/2024   Hip fracture (HCC) 04/27/2024   GAD (generalized anxiety disorder) 04/01/2024   Thyroid  nodule 01/10/2022   Anxiety 08/10/2021   Aortic atherosclerosis (HCC) - CT 06/2021 06/15/2021   Bilateral renal cysts 07/06/2020   Paroxysmal supraventricular tachycardia 10/20/2019   History of skin cancer (basal cell) 06/16/2019   Complete right bundle branch block 06/16/2019   B12 deficiency 06/15/2019   History of TIA (transient ischemic attack) 10/23/2017   Chronic kidney disease, stage 3b (HCC) 07/21/2015   Medication management 07/04/2014   Essential hypertension 11/25/2013   Hyperlipidemia, mixed 11/25/2013   Abnormal glucose 11/25/2013   Vitamin D  deficiency 11/25/2013   Essential tremor 11/25/2013   History of endometrial cancer    Hx/o DVT/PE    Urethral prolapse  Osteoporosis 10/10/2011    ONSET DATE: 04/28/2024  REFERRING DIAG: S/P L intertrachanteric femur fracture, Left distal radius fracture 04/28/2024   THERAPY DIAG:  Unsteadiness on feet  Other abnormalities of gait and mobility  Muscle weakness (generalized)  Rationale for Evaluation and Treatment: Rehabilitation  SUBJECTIVE:                                                                                                                                                                                             SUBJECTIVE STATEMENT: Had a physical yesterday and did talk with MD about the LLE swelling.  At some point, may look into it more, but for now I will continue to use to stockings. Did practice in the house with my cane.      Pt accompanied by: self  PERTINENT HISTORY: osteoporosis, hx of DVT; see additional PMH above  PAIN:  Are you having pain? No   PRECAUTIONS: Fall, wears L compression stocking due to L LE edema (was negative for DVT)  RED FLAGS: None   WEIGHT BEARING RESTRICTIONS: No  FALLS: Has patient fallen in last 6 months? Yes. Number of falls 1  LIVING ENVIRONMENT: Lives with: lives alone Lives in: House/apartment Stairs: 3 steps to enter home;  Has following equipment at home: Single point cane and Walker - 2 wheeled *cane has tripod base and has gotten quad tip (per 10/28 visit) PLOF: Independent  PATIENT GOALS: To be able to transition to the cane, so I can get in and out of home better.  OBJECTIVE:    TODAY'S TREATMENT: 10/05/2024 Activity Comments  Gait training with quad tip cane-around furniture and changes of direction, multiple reps Supervision, good stability, slight tremor RUE with cane use  Stair navigation, multiple reps Step-to pattern with cane, supervision, good stability  Seated LAQ  Seated march 2 x 10, 3# BLE  Standing strengthening Hip abduction Hip extension Hamstring curls Marching in place  2 x 10, 3# BUE> 1 UE support  Balance:  feet together EO/EC 30 sec x 3 reps LLE SLS with R foot propped on step, 3 reps 10 sec LLE as stance with RLE step taps to 4 and 8 steps, 2 x 10 reps, with 1 UE >no UE support Min guard/min assist with balance with lessening UE support        PATIENT EDUCATION: Education details:  Add weights (3#) to standing exercises at home; continue to advise that pt is safe to use cane inside the house Person educated: Patient Education method: Explanation, Demonstration, Tactile cues, Verbal cues, and Handouts Education comprehension: verbalized understanding and returned demonstration  Access Code: [email or web address] URL: [email or web address] Date: 09/09/2024 Prepared by: [facility] - Outpatient  Rehab - [facility]  Program Notes (per pt report 10/20-using 3# at home) Marching in place, side hip kicks, back hip kicks:  Do 3 sets of 10 with 2# weight  Exercises - Backward Walking with Counter Support  - 1 x daily - 5 x weekly - 1 sets - 5 reps - Alternating Step Taps with Counter Support  - 1 x daily - 5 x weekly - 3 sets - 10 reps - Side Stepping with Counter Support  - 1 x daily - 5 x weekly - 1 sets - 5 reps   -------------------------------------------- Note: Objective measures were completed at Evaluation unless otherwise noted.  DIAGNOSTIC FINDINGS: Interval proximal left femoral cephalomedullary nail fixation of the previously seen intertrochanteric fracture. Improved alignment.  COGNITION: Overall cognitive status: Within functional limits for tasks assessed   SENSATION: Reports decreased sensation in L fingers (to see hand surgeon)  EDEMA:  Noted LLE; wearing compression stocking  POSTURE: rounded shoulders and increased thoracic kyphosis  LOWER EXTREMITY ROM:     Active  Right Eval Left Eval  Hip flexion    Hip extension    Hip abduction    Hip adduction    Hip internal rotation    Hip external rotation    Knee flexion    Knee extension    Ankle dorsiflexion  10  Ankle plantarflexion  40  Ankle inversion    Ankle eversion     (Blank rows = not tested)  LOWER EXTREMITY MMT:    MMT Right Eval Left Eval  Hip flexion 4 3+  Hip extension    Hip abduction 5 4  Hip adduction 5 4  Hip internal rotation    Hip external rotation    Knee flexion 4+ 4+  Knee extension 4+ 4  Ankle dorsiflexion 4+ 4  Ankle plantarflexion    Ankle inversion    Ankle eversion    (Blank rows = not tested)  TRANSFERS: Sit to stand: Modified independence  Assistive device utilized: None     Stand to sit: Modified independence  Assistive device utilized: hands at chair      STAIRS: Findings: Level of Assistance: Modified independence, Stair Negotiation Technique: Step to  Pattern with Single Rail on Right, Number of Stairs: 2-3, Height of Stairs: 4-6   , and Comments: prior to fracture, pt negotiated steps alternating pattern GAIT: Findings: Gait Characteristics: step through pattern, decreased step length- Right, and decreased stance time- Left, Distance walked: 50 ft x 2, Assistive device utilized:Walker - 2 wheeled and SPC with small quad tip base, Level of assistance: Modified independence, CGA, and Min A, and Comments: Mod I with RW, CGA/min assist with cane  FUNCTIONAL TESTS:  10 meter walk test: 16.78 sec RW (1.95 ft/sec); 33.62 sec with cane (0.98 ft/sec) Berg:  34/56 (Scores <45/56 indicate increased fall risk)  TREATMENT DATE: 08/30/2024    PATIENT EDUCATION: Education details: Eval results, POC, continue HEP from HHPT; verbally added lateral weightshifting to LLE, with attention to light UE support Person educated: Patient Education method: Explanation, Demonstration, and Verbal cues Education comprehension: verbalized understanding, returned demonstration, and needs further education  HOME EXERCISE PROGRAM: Verbally initiated lateral weightshifting; pt doing standing hip kicks, heel/toe raises, marching and sit to stand at home  GOALS: Goals reviewed with patient? Yes  SHORT TERM GOALS: Target date: 10/01/2024  Pt will be independent with HEP for improved gait, stair negotiation, balance. Baseline: no concerns on HEP  09/29/24 Goal status: IN PROGRESS 09/29/24  2.  Pt will improve Berg score to at least 45/56 to decrease fall risk. Baseline: 34/56>44/56 09/27/2024 Goal status: PARTIALLY MET 09/27/2024  3.  Pt will negotiate curb and 3 steps with cane, mod I, for improved household and community gait. Baseline: stairs with cane with CGA  09/29/24 Goal status: IN PROGRESS  09/29/24   LONG TERM GOALS: Target  date: 10/29/2024  Pt will be independent with HEP for improved balance, strength, gait, stairs. Baseline:  Goal status: IN PROGRESS  2.  Pt will improve DGI score to at least 20/24 to decrease fall risk. Baseline: TBD Goal status:IN PROGRESS  3.  Pt will improve gait velocity to at least 2.6 ft/sec with cane for improved gait efficiency and safety. Baseline: 0.98 ft/sec Goal status: IN PROGRESS  4.  Pt will ambulate at least 500 ft, indoor and outdoor gait, mod I with cane for improved community mobility. Baseline:  Goal status: IN PROGRESS   ASSESSMENT:  CLINICAL IMPRESSION: Pt presents today with no new complaints; reports that she has walked in her home with cane with no difficulty. Skilled PT session focused on gait training with cane as well as education with stair training with cane.  She continues to do a nice job with cane and stair sequencing with supervision.  Discussed that she is progressing towards being able to do this to get in/out of home; however, will wait on formal recommendation due to rain/windy weather/excess wet leaves on steps this week.  Worked on strengthening and balance; she is able to progress to less UE support with LLE as stance, but PT maintains min guard assist due to instability through LLE at times.  She will continue to benefit from skilled PT towards goals for improved functional mobility, independence, and decreased fall risk.  OBJECTIVE IMPAIRMENTS: Abnormal gait, decreased balance, decreased mobility, difficulty walking, and decreased strength.   ACTIVITY LIMITATIONS: standing, stairs, transfers, and locomotion level  PARTICIPATION LIMITATIONS: meal prep, cleaning, driving, shopping, community activity, yard work, and church  PERSONAL FACTORS: 3+ comorbidities: see PMH above are also affecting patient's functional outcome.   REHAB POTENTIAL: Good  CLINICAL DECISION MAKING: Evolving/moderate complexity  EVALUATION COMPLEXITY:  Moderate  PLAN:  PT FREQUENCY: 2x/week  PT DURATION: 8 weeks plus eval visit  PLANNED INTERVENTIONS: 97750- Physical Performance Testing, 97110-Therapeutic exercises, 97530- Therapeutic activity, [id number]- Neuromuscular re-education, 97535- Self Care, [id number]- Manual therapy, [phone]- Gait training, Patient/Family education, Balance training, Stair training, and DME instructions  PLAN FOR NEXT SESSION:  How did weights go at home with standing ex?  Continue weights with standing at counter.  Work on ankle weight progression and stairs.  Progress strength and balance *Needs to practice putting walker in the car, steps with cane, indoor/outdoor gait with cane.     Greig Anon, PT 10/05/24 1:12 PM Phone: [phone] Fax: [phone]  [facility] at [facility] [address] [place], [place] [address] Phone # [phone] Fax # [phone]

## 2024-10-05 NOTE — Progress Notes (Signed)
 Cultures negative.  Does she take fish oil?  Not sure what is causing odor, but if continues, we need to do a vaginal exam

## 2024-10-07 ENCOUNTER — Ambulatory Visit: Admitting: Physical Therapy

## 2024-10-07 ENCOUNTER — Telehealth: Payer: Self-pay | Admitting: Orthopedic Surgery

## 2024-10-07 ENCOUNTER — Encounter: Payer: Self-pay | Admitting: Physical Therapy

## 2024-10-07 ENCOUNTER — Telehealth: Payer: Self-pay

## 2024-10-07 ENCOUNTER — Ambulatory Visit: Admitting: Occupational Therapy

## 2024-10-07 ENCOUNTER — Other Ambulatory Visit: Payer: Self-pay

## 2024-10-07 DIAGNOSIS — G5602 Carpal tunnel syndrome, left upper limb: Secondary | ICD-10-CM

## 2024-10-07 DIAGNOSIS — M6281 Muscle weakness (generalized): Secondary | ICD-10-CM | POA: Diagnosis not present

## 2024-10-07 DIAGNOSIS — R41840 Attention and concentration deficit: Secondary | ICD-10-CM | POA: Diagnosis not present

## 2024-10-07 DIAGNOSIS — R2689 Other abnormalities of gait and mobility: Secondary | ICD-10-CM

## 2024-10-07 DIAGNOSIS — R208 Other disturbances of skin sensation: Secondary | ICD-10-CM | POA: Diagnosis not present

## 2024-10-07 DIAGNOSIS — R2681 Unsteadiness on feet: Secondary | ICD-10-CM

## 2024-10-07 DIAGNOSIS — M25632 Stiffness of left wrist, not elsewhere classified: Secondary | ICD-10-CM | POA: Diagnosis not present

## 2024-10-07 DIAGNOSIS — R278 Other lack of coordination: Secondary | ICD-10-CM | POA: Diagnosis not present

## 2024-10-07 DIAGNOSIS — R41842 Visuospatial deficit: Secondary | ICD-10-CM | POA: Diagnosis not present

## 2024-10-07 NOTE — Telephone Encounter (Signed)
 Spoke with patient;  She had questions pertaining to her Eliquis  and nail polish. I told her to follow the directions for her Eliquis  based on provider who prescribed it. Told her nail polish is fine

## 2024-10-07 NOTE — Telephone Encounter (Signed)
 Patient called and said she has some question pertaining to the surgery if you could give her a call. CB#[phone]

## 2024-10-07 NOTE — Therapy (Signed)
 OUTPATIENT PHYSICAL THERAPY NEURO TREATMENT NOTE   Patient Name: Monica Curry MRN: [id number] DOB:10-26-1931, 89 y.o., female Today's Date: 10/08/2024   PCP: Wendolyn Jenkins Jansky, MD REFERRING PROVIDER: Danton Lauraine LABOR, PA-C   END OF SESSION:  PT End of Session - 10/07/24 1019     Visit Number 9    Number of Visits 17    Date for Recertification  10/29/24    Authorization Type [employer]    Authorization Time Period 08/30/2024-10/29/2024    Authorization - Visit Number 9    Authorization - Number of Visits 17    Progress Note Due on Visit 10    PT Start Time 1020    PT Stop Time 1100    PT Time Calculation (min) 40 min    Equipment Utilized During Treatment Gait belt    Activity Tolerance Patient tolerated treatment well    Behavior During Therapy WFL for tasks assessed/performed                Past Medical History:  Diagnosis Date   Allergy    Medications and pollen   Anemia    Anxiety    Always   C. difficile colitis    Cancer (HCC)    Endometrial cancer   Cataract    Had surgery 2021   Chronic kidney disease 2018   2 siblings had kidney failure, sister had transplant   Clotting disorder    Essential and other specified forms of tremor    Hyperlipidemia    Hypertension    Osteoporosis 11/2019   T score -2.8 stable from prior DEXA   Personal history of venous thrombosis and embolism 1997   Pulmonary embolism (HCC) 06/14/2021   Pulmonary embolus (HCC)    Shingles    Stroke (HCC) 2018   TIA   Thrush    TIA (transient ischemic attack)    carotid US  07/2022: no ICA stenosis bilaterally   Past Surgical History:  Procedure Laterality Date   ABDOMINAL HYSTERECTOMY  1997   APPENDECTOMY  1961   cataract surg Bilateral 2021   Dr. Roz, R eye 8/18, L 8/25   CESAREAN SECTION  1961   EYE SURGERY  2021   Cataract both eyes   FRACTURE SURGERY  2025   Broken hip   INTRAMEDULLARY (IM) NAIL INTERTROCHANTERIC Left 04/28/2024   Procedure: FIXATION,  FRACTURE, INTERTROCHANTERIC, WITH INTRAMEDULLARY ROD;  Surgeon: Kendal Franky SQUIBB, MD;  Location: [facility] OR;  Service: Orthopedics;  Laterality: Left;   TONSILLECTOMY AND ADENOIDECTOMY  1944   TOTAL ABDOMINAL HYSTERECTOMY W/ BILATERAL SALPINGOOPHORECTOMY  1997   Patient Active Problem List   Diagnosis Date Noted   RLS (restless legs syndrome) 06/15/2024   Fracture, Col[place], left, closed 04/28/2024   Hip fracture (HCC) 04/27/2024   GAD (generalized anxiety disorder) 04/01/2024   Thyroid  nodule 01/10/2022   Anxiety 08/10/2021   Aortic atherosclerosis (HCC) - CT 06/2021 06/15/2021   Bilateral renal cysts 07/06/2020   Paroxysmal supraventricular tachycardia 10/20/2019   History of skin cancer (basal cell) 06/16/2019   Complete right bundle branch block 06/16/2019   B12 deficiency 06/15/2019   History of TIA (transient ischemic attack) 10/23/2017   Chronic kidney disease, stage 3b (HCC) 07/21/2015   Medication management 07/04/2014   Essential hypertension 11/25/2013   Hyperlipidemia, mixed 11/25/2013   Abnormal glucose 11/25/2013   Vitamin D  deficiency 11/25/2013   Essential tremor 11/25/2013   History of endometrial cancer    Hx/o DVT/PE    Urethral prolapse  Osteoporosis 10/10/2011    ONSET DATE: 04/28/2024  REFERRING DIAG: S/P L intertrachanteric femur fracture, Left distal radius fracture 04/28/2024   THERAPY DIAG:  Unsteadiness on feet  Other abnormalities of gait and mobility  Muscle weakness (generalized)  Rationale for Evaluation and Treatment: Rehabilitation  SUBJECTIVE:                                                                                                                                                                                             SUBJECTIVE STATEMENT: Tuesday at 1 pm will be my surgery for the L hand.  I will see how that goes.  Brought in my cane today.  It feels more wobbly than the one we use in PT sessions.   Pt accompanied by:  self  PERTINENT HISTORY: osteoporosis, hx of DVT; see additional PMH above  PAIN:  Are you having pain? No   PRECAUTIONS: Fall, wears L compression stocking due to L LE edema (was negative for DVT)  RED FLAGS: None   WEIGHT BEARING RESTRICTIONS: No  FALLS: Has patient fallen in last 6 months? Yes. Number of falls 1  LIVING ENVIRONMENT: Lives with: lives alone Lives in: House/apartment Stairs: 3 steps to enter home;  Has following equipment at home: Single point cane and Walker - 2 wheeled *cane has tripod base and has gotten quad tip (per 10/28 visit) PLOF: Independent  PATIENT GOALS: To be able to transition to the cane, so I can get in and out of home better.  OBJECTIVE:   Pt brought in her cane with quad tip base-problem solved this through, as her base is not as stable as the base on the quad tip in the clinic.  Discussed going to local equipment store to trial some additional bases/canes, as hers is definitely not as stable (more like a hurry cane).  We did adjust the cane up one notch, and this did seem to improve stability a little, but the rubber in the base gives more than our clinic cane.  TODAY'S TREATMENT: 10/07/2024 Activity Comments  Gait training with SPC-multiple bouts of 100-150 ft, including turns, changing directions, negotiating around furniture Clinic cane vs. Pt's cane; did adjust her cane and it becomes slightly more stable; supervion/mod I in clinic distances  Stair negotiation with cane-step to pattern Supervision/mod I  Stair negotiation with single rail (holding cane in other hand), step through pattern  Supervision/mod I Good stability  Practiced simulation of curb steps in clinic-used 8 aerobic step and used 6 block Multiple reps with cane; cues for cane placement, foot placement for optimal clearance descending steps, CGA>supervision  Standing BLE  strengthening: Standing strengthening Hip abduction Hip extension Hamstring curls Marching in  place BLE 4#            PATIENT EDUCATION: Education details:  See above-discussing pt's cane tip Person educated: Patient Education method: Explanation, Demonstration, Tactile cues, Verbal cues, and Handouts Education comprehension: verbalized understanding and returned demonstration    Access Code: [email or web address] URL: [email or web address] Date: 09/09/2024 Prepared by: [facility]  Program Notes (per pt report 10/20-using 3# at home) Marching in place, side hip kicks, back hip kicks:  Do 3 sets of 10 with 2# weight  Exercises - Backward Walking with Counter Support  - 1 x daily - 5 x weekly - 1 sets - 5 reps - Alternating Step Taps with Counter Support  - 1 x daily - 5 x weekly - 3 sets - 10 reps - Side Stepping with Counter Support  - 1 x daily - 5 x weekly - 1 sets - 5 reps   -------------------------------------------- Note: Objective measures were completed at Evaluation unless otherwise noted.  DIAGNOSTIC FINDINGS: Interval proximal left femoral cephalomedullary nail fixation of the previously seen intertrochanteric fracture. Improved alignment.  COGNITION: Overall cognitive status: Within functional limits for tasks assessed   SENSATION: Reports decreased sensation in L fingers (to see hand surgeon)  EDEMA:  Noted LLE; wearing compression stocking  POSTURE: rounded shoulders and increased thoracic kyphosis  LOWER EXTREMITY ROM:     Active  Right Eval Left Eval  Hip flexion    Hip extension    Hip abduction    Hip adduction    Hip internal rotation    Hip external rotation    Knee flexion    Knee extension    Ankle dorsiflexion  10  Ankle plantarflexion  40  Ankle inversion    Ankle eversion     (Blank rows = not tested)  LOWER EXTREMITY MMT:    MMT Right Eval Left Eval  Hip flexion 4 3+  Hip extension    Hip abduction 5 4  Hip adduction 5 4  Hip internal rotation    Hip external rotation     Knee flexion 4+ 4+  Knee extension 4+ 4  Ankle dorsiflexion 4+ 4  Ankle plantarflexion    Ankle inversion    Ankle eversion    (Blank rows = not tested)  TRANSFERS: Sit to stand: Modified independence  Assistive device utilized: None     Stand to sit: Modified independence  Assistive device utilized: hands at chair      STAIRS: Findings: Level of Assistance: Modified independence, Stair Negotiation Technique: Step to Pattern with Single Rail on Right, Number of Stairs: 2-3, Height of Stairs: 4-6   , and Comments: prior to fracture, pt negotiated steps alternating pattern GAIT: Findings: Gait Characteristics: step through pattern, decreased step length- Right, and decreased stance time- Left, Distance walked: 50 ft x 2, Assistive device utilized:Walker - 2 wheeled and SPC with small quad tip base, Level of assistance: Modified independence, CGA, and Min A, and Comments: Mod I with RW, CGA/min assist with cane  FUNCTIONAL TESTS:  10 meter walk test: 16.78 sec RW (1.95 ft/sec); 33.62 sec with cane (0.98 ft/sec) Berg:  34/56 (Scores <45/56 indicate increased fall risk)  TREATMENT DATE: 08/30/2024    PATIENT EDUCATION: Education details: Eval results, POC, continue HEP from HHPT; verbally added lateral weightshifting to LLE, with attention to light UE support Person educated: Patient Education method: Explanation, Demonstration, and Verbal cues Education comprehension: verbalized understanding, returned demonstration, and needs further education  HOME EXERCISE PROGRAM: Verbally initiated lateral weightshifting; pt doing standing hip kicks, heel/toe raises, marching and sit to stand at home  GOALS: Goals reviewed with patient? Yes  SHORT TERM GOALS: Target date: 10/01/2024  Pt will be independent with HEP for improved gait, stair negotiation,  balance. Baseline: no concerns on HEP  09/29/24 Goal status: IN PROGRESS 09/29/24  2.  Pt will improve Berg score to at least 45/56 to decrease fall risk. Baseline: 34/56>44/56 09/27/2024 Goal status: PARTIALLY MET 09/27/2024  3.  Pt will negotiate curb and 3 steps with cane, mod I, for improved household and community gait. Baseline: stairs with cane with CGA  09/29/24 Goal status: IN PROGRESS  09/29/24   LONG TERM GOALS: Target date: 10/29/2024  Pt will be independent with HEP for improved balance, strength, gait, stairs. Baseline:  Goal status: IN PROGRESS  2.  Pt will improve DGI score to at least 20/24 to decrease fall risk. Baseline: TBD Goal status:IN PROGRESS  3.  Pt will improve gait velocity to at least 2.6 ft/sec with cane for improved gait efficiency and safety. Baseline: 0.98 ft/sec Goal status: IN PROGRESS  4.  Pt will ambulate at least 500 ft, indoor and outdoor gait, mod I with cane for improved community mobility. Baseline:  Goal status: IN PROGRESS   ASSESSMENT:  CLINICAL IMPRESSION: Pt presents today reporting she is to have carpal tunnel release surgery next week, 11/4.   She anticipates return to PT next week, but will wait to hear about post-surgery restrictions.  Skilled PT session focused on gait training with cane, as she brought in her cane with newly ordered rubber quad tip.  Her quad tip is inherently not as stable as the clinic quad tip, just in the way it gives more at the base.  Discussed options for this, and continued to work on gait and stairs.  She is very steady with stair negotiation using single rail, with step through pattern, simulating what she has at home.  Worked also on hydrographic surveyor and pt needs supervision, cueing throughout for cane and foot placement descending the step.   Pt will continue to benefit from skilled PT towards goals for improved functional mobility and decreased fall risk.  OBJECTIVE IMPAIRMENTS: Abnormal gait,  decreased balance, decreased mobility, difficulty walking, and decreased strength.   ACTIVITY LIMITATIONS: standing, stairs, transfers, and locomotion level  PARTICIPATION LIMITATIONS: meal prep, cleaning, driving, shopping, community activity, yard work, and church  PERSONAL FACTORS: 3+ comorbidities: see PMH above are also affecting patient's functional outcome.   REHAB POTENTIAL: Good  CLINICAL DECISION MAKING: Evolving/moderate complexity  EVALUATION COMPLEXITY: Moderate  PLAN:  PT FREQUENCY: 2x/week  PT DURATION: 8 weeks plus eval visit  PLANNED INTERVENTIONS: 97750- Physical Performance Testing, 97110-Therapeutic exercises, 97530- Therapeutic activity, [id number]- Neuromuscular re-education, 97535- Self Care, [id number]- Manual therapy, [phone]- Gait training, Patient/Family education, Balance training, Stair training, and DME instructions  PLAN FOR NEXT SESSION:  10th Visit PN; check on restrictions post surgery.  Continue weight progression with standing at counter.  Stair negotiation, curb negotiation, outdoor gait with cane   Greig Anon, PT 10/08/24 7:42 AM Phone: [phone] Fax: [phone]  [facility] [address] [place], [place] [address] Phone # [phone] Fax # [phone]

## 2024-10-11 ENCOUNTER — Other Ambulatory Visit: Payer: Self-pay | Admitting: Orthopedic Surgery

## 2024-10-11 ENCOUNTER — Encounter: Payer: Self-pay | Admitting: Radiology

## 2024-10-11 MED ORDER — TRAMADOL HCL 50 MG PO TABS: 50.0000 mg | ORAL_TABLET | Freq: Four times a day (QID) | ORAL | 0 refills | Status: DC | PRN

## 2024-10-12 DIAGNOSIS — G5602 Carpal tunnel syndrome, left upper limb: Secondary | ICD-10-CM | POA: Diagnosis not present

## 2024-10-12 HISTORY — PX: CARPAL TUNNEL RELEASE: SHX101

## 2024-10-14 ENCOUNTER — Ambulatory Visit: Attending: Student | Admitting: Physical Therapy

## 2024-10-14 ENCOUNTER — Encounter: Payer: Self-pay | Admitting: Physical Therapy

## 2024-10-14 DIAGNOSIS — R2681 Unsteadiness on feet: Secondary | ICD-10-CM | POA: Insufficient documentation

## 2024-10-14 DIAGNOSIS — M25632 Stiffness of left wrist, not elsewhere classified: Secondary | ICD-10-CM | POA: Insufficient documentation

## 2024-10-14 DIAGNOSIS — R208 Other disturbances of skin sensation: Secondary | ICD-10-CM | POA: Insufficient documentation

## 2024-10-14 DIAGNOSIS — R2689 Other abnormalities of gait and mobility: Secondary | ICD-10-CM | POA: Insufficient documentation

## 2024-10-14 DIAGNOSIS — R278 Other lack of coordination: Secondary | ICD-10-CM | POA: Diagnosis not present

## 2024-10-14 DIAGNOSIS — M6281 Muscle weakness (generalized): Secondary | ICD-10-CM | POA: Diagnosis not present

## 2024-10-14 NOTE — Therapy (Signed)
 OUTPATIENT PHYSICAL THERAPY NEURO TREATMENT NOTE/10th VISIT PROGRESS NOTE   Patient Name: Monica Curry MRN: [id number] DOB:Apr 19, 1931, 89 y.o., female Today's Date: 10/14/2024   PCP: Wendolyn Jenkins Jansky, MD REFERRING PROVIDER: Danton Lauraine LABOR, PA-C   Progress Note Reporting Period 08/30/2024 to 10/14/2024  See note below for Objective Data and Assessment of Progress/Goals.     END OF SESSION:  PT End of Session - 10/14/24 1452     Visit Number 10    Number of Visits 17    Date for Recertification  10/29/24    Authorization Type [employer]    Authorization Time Period 08/30/2024-10/29/2024    Authorization - Visit Number 10    Authorization - Number of Visits 17    Progress Note Due on Visit 10    PT Start Time 1450    PT Stop Time 1530    PT Time Calculation (min) 40 min    Equipment Utilized During Treatment Gait belt    Activity Tolerance Patient tolerated treatment well    Behavior During Therapy WFL for tasks assessed/performed                 Past Medical History:  Diagnosis Date   Allergy    Medications and pollen   Anemia    Anxiety    Always   C. difficile colitis    Cancer (HCC)    Endometrial cancer   Cataract    Had surgery 2021   Chronic kidney disease 2018   2 siblings had kidney failure, sister had transplant   Clotting disorder    Essential and other specified forms of tremor    Hyperlipidemia    Hypertension    Osteoporosis 11/2019   T score -2.8 stable from prior DEXA   Personal history of venous thrombosis and embolism 1997   Pulmonary embolism (HCC) 06/14/2021   Pulmonary embolus (HCC)    Shingles    Stroke (HCC) 2018   TIA   Thrush    TIA (transient ischemic attack)    carotid US  07/2022: no ICA stenosis bilaterally   Past Surgical History:  Procedure Laterality Date   ABDOMINAL HYSTERECTOMY  1997   APPENDECTOMY  1961   cataract surg Bilateral 2021   Dr. Roz, R eye 8/18, L 8/25   CESAREAN SECTION  1961   EYE  SURGERY  2021   Cataract both eyes   FRACTURE SURGERY  2025   Broken hip   INTRAMEDULLARY (IM) NAIL INTERTROCHANTERIC Left 04/28/2024   Procedure: FIXATION, FRACTURE, INTERTROCHANTERIC, WITH INTRAMEDULLARY ROD;  Surgeon: Kendal Franky SQUIBB, MD;  Location: [facility] OR;  Service: Orthopedics;  Laterality: Left;   TONSILLECTOMY AND ADENOIDECTOMY  1944   TOTAL ABDOMINAL HYSTERECTOMY W/ BILATERAL SALPINGOOPHORECTOMY  1997   Patient Active Problem List   Diagnosis Date Noted   RLS (restless legs syndrome) 06/15/2024   Fracture, Col[place], left, closed 04/28/2024   Hip fracture (HCC) 04/27/2024   GAD (generalized anxiety disorder) 04/01/2024   Thyroid  nodule 01/10/2022   Anxiety 08/10/2021   Aortic atherosclerosis (HCC) - CT 06/2021 06/15/2021   Bilateral renal cysts 07/06/2020   Paroxysmal supraventricular tachycardia 10/20/2019   History of skin cancer (basal cell) 06/16/2019   Complete right bundle branch block 06/16/2019   B12 deficiency 06/15/2019   History of TIA (transient ischemic attack) 10/23/2017   Chronic kidney disease, stage 3b (HCC) 07/21/2015   Medication management 07/04/2014   Essential hypertension 11/25/2013   Hyperlipidemia, mixed 11/25/2013   Abnormal glucose 11/25/2013  Vitamin D  deficiency 11/25/2013   Essential tremor 11/25/2013   History of endometrial cancer    Hx/o DVT/PE    Urethral prolapse    Osteoporosis 10/10/2011    ONSET DATE: 04/28/2024  REFERRING DIAG: S/P L intertrachanteric femur fracture, Left distal radius fracture 04/28/2024   THERAPY DIAG:  Unsteadiness on feet  Other abnormalities of gait and mobility  Muscle weakness (generalized)  Rationale for Evaluation and Treatment: Rehabilitation  SUBJECTIVE:                                                                                                                                                                                             SUBJECTIVE STATEMENT: Have done well from the surgery.   Using the cane better, letting it rock through.   Pt accompanied by: self  PERTINENT HISTORY: osteoporosis, hx of DVT; see additional PMH above  PAIN:  Are you having pain? No   PRECAUTIONS: Fall, wears L compression stocking due to L LE edema (was negative for DVT)  **CTR surgery LUE 10/12/24:  wearing bandage for 5 days, no lifting, to return to MD 11/18 for removal of stitches, follow up  RED FLAGS: None   WEIGHT BEARING RESTRICTIONS: No  FALLS: Has patient fallen in last 6 months? Yes. Number of falls 1  LIVING ENVIRONMENT: Lives with: lives alone Lives in: House/apartment Stairs: 3 steps to enter home;  Has following equipment at home: Single point cane and Walker - 2 wheeled *cane has tripod base and has gotten quad tip (per 10/28 visit) PLOF: Independent  PATIENT GOALS: To be able to transition to the cane, so I can get in and out of home better.  OBJECTIVE:    TODAY'S TREATMENT: 10/14/2024 Activity Comments  FTSTS:  15.96 sec without UE support   TUG 17.25 sec cane   Gait velocity with cane:  19.37 sec = 1.69 ft/sec Improved from 0.98 ft/sec initially  Stair negotiation Step through pattern with rail, holding cane L hand  Step ups:  step up/up, down/down, then single limb step up 2 x 10 reps RUE support at rail  Four square step activity  Forward step over obstacle x 10, side step over obstacle x 10 Over canes-min guard assist and unsteadiness in post direction 1 UE support  Gait training outdoors on sidewalk x 100 ft, then curb negotiation with cane, 8 reps with CGA>progressing to supervision Initially needs cues for foot placement, cane placement for curb negotiation      PATIENT EDUCATION: Education details:  Continue current HEP routine; gait with cane into therapy sessions Person educated: Patient Education method: Explanation, Demonstration, Tactile  cues, Verbal cues, and Handouts Education comprehension: verbalized understanding and returned  demonstration    Access Code: [email or web address] URL: [email or web address] Date: 09/09/2024 Prepared by: [facility] - Outpatient  Rehab - [facility]  Program Notes (per pt report 10/20-using 3# at home) Marching in place, side hip kicks, back hip kicks:  Do 3 sets of 10 with 2# weight  Exercises - Backward Walking with Counter Support  - 1 x daily - 5 x weekly - 1 sets - 5 reps - Alternating Step Taps with Counter Support  - 1 x daily - 5 x weekly - 3 sets - 10 reps - Side Stepping with Counter Support  - 1 x daily - 5 x weekly - 1 sets - 5 reps   -------------------------------------------- Note: Objective measures were completed at Evaluation unless otherwise noted.  DIAGNOSTIC FINDINGS: Interval proximal left femoral cephalomedullary nail fixation of the previously seen intertrochanteric fracture. Improved alignment.  COGNITION: Overall cognitive status: Within functional limits for tasks assessed   SENSATION: Reports decreased sensation in L fingers (to see hand surgeon)  EDEMA:  Noted LLE; wearing compression stocking  POSTURE: rounded shoulders and increased thoracic kyphosis  LOWER EXTREMITY ROM:     Active  Right Eval Left Eval  Hip flexion    Hip extension    Hip abduction    Hip adduction    Hip internal rotation    Hip external rotation    Knee flexion    Knee extension    Ankle dorsiflexion  10  Ankle plantarflexion  40  Ankle inversion    Ankle eversion     (Blank rows = not tested)  LOWER EXTREMITY MMT:    MMT Right Eval Left Eval  Hip flexion 4 3+  Hip extension    Hip abduction 5 4  Hip adduction 5 4  Hip internal rotation    Hip external rotation    Knee flexion 4+ 4+  Knee extension 4+ 4  Ankle dorsiflexion 4+ 4  Ankle plantarflexion    Ankle inversion    Ankle eversion    (Blank rows = not tested)  TRANSFERS: Sit to stand: Modified independence  Assistive device utilized: None     Stand to sit: Modified  independence  Assistive device utilized: hands at chair      STAIRS: Findings: Level of Assistance: Modified independence, Stair Negotiation Technique: Step to Pattern with Single Rail on Right, Number of Stairs: 2-3, Height of Stairs: 4-6   , and Comments: prior to fracture, pt negotiated steps alternating pattern GAIT: Findings: Gait Characteristics: step through pattern, decreased step length- Right, and decreased stance time- Left, Distance walked: 50 ft x 2, Assistive device utilized:Walker - 2 wheeled and SPC with small quad tip base, Level of assistance: Modified independence, CGA, and Min A, and Comments: Mod I with RW, CGA/min assist with cane  FUNCTIONAL TESTS:  10 meter walk test: 16.78 sec RW (1.95 ft/sec); 33.62 sec with cane (0.98 ft/sec) Berg:  34/56 (Scores <45/56 indicate increased fall risk)  TREATMENT DATE: 08/30/2024    PATIENT EDUCATION: Education details: Eval results, POC, continue HEP from HHPT; verbally added lateral weightshifting to LLE, with attention to light UE support Person educated: Patient Education method: Explanation, Demonstration, and Verbal cues Education comprehension: verbalized understanding, returned demonstration, and needs further education  HOME EXERCISE PROGRAM: Verbally initiated lateral weightshifting; pt doing standing hip kicks, heel/toe raises, marching and sit to stand at home  GOALS: Goals reviewed with patient? Yes  SHORT TERM GOALS: Target date: 10/01/2024  Pt will be independent with HEP for improved gait, stair negotiation, balance. Baseline: no concerns on HEP  09/29/24 Goal status: IN PROGRESS 09/29/24  2.  Pt will improve Berg score to at least 45/56 to decrease fall risk. Baseline: 34/56>44/56 09/27/2024 Goal status: PARTIALLY MET 09/27/2024  3.  Pt will negotiate curb and 3 steps with cane,  mod I, for improved household and community gait. Baseline: stairs with cane with CGA  09/29/24 Goal status: IN PROGRESS  09/29/24   LONG TERM GOALS: Target date: 10/29/2024  Pt will be independent with HEP for improved balance, strength, gait, stairs. Baseline:  Goal status: IN PROGRESS  2.  Pt will improve DGI score to at least 20/24 to decrease fall risk. Baseline: 11/24 Goal status:IN PROGRESS  3.  Pt will improve gait velocity to at least 2.6 ft/sec with cane for improved gait efficiency and safety. Baseline: 0.98 ft/sec Goal status: IN PROGRESS  4.  Pt will ambulate at least 500 ft, indoor and outdoor gait, mod I with cane for improved community mobility. Baseline:  Goal status: IN PROGRESS   ASSESSMENT:  CLINICAL IMPRESSION: Pt presents today and reports she is overall stronger, though she does feel not quite her best today due to CTR surgery to LUE on 11/4.  She is moving quite well today, considering, using her cane with rubber quad tip.  Objective measures:  gait velocity with cane 1.69 ft/sec, improved from 0.98 ft/sec.  Stair negotiation mod I with rail, step through pattern; supervision/CGA with curb negotiation with cane.  TUG score >17 sec and FTSTS score >15 seconds taken today, and previous DGI score of 11/24 indicate increased fall risk.  She has progressed well with therapy and will continue to benefit from skilled PT towards goals for improved functional mobility, independence, and decreased fall risk.   OBJECTIVE IMPAIRMENTS: Abnormal gait, decreased balance, decreased mobility, difficulty walking, and decreased strength.   ACTIVITY LIMITATIONS: standing, stairs, transfers, and locomotion level  PARTICIPATION LIMITATIONS: meal prep, cleaning, driving, shopping, community activity, yard work, and church  PERSONAL FACTORS: 3+ comorbidities: see PMH above are also affecting patient's functional outcome.   REHAB POTENTIAL: Good  CLINICAL DECISION MAKING:  Evolving/moderate complexity  EVALUATION COMPLEXITY: Moderate  PLAN:  PT FREQUENCY: 2x/week  PT DURATION: 8 weeks plus eval visit  PLANNED INTERVENTIONS: 97750- Physical Performance Testing, 97110-Therapeutic exercises, 97530- Therapeutic activity, [id number]- Neuromuscular re-education, 97535- Self Care, [id number]- Manual therapy, [phone]- Gait training, Patient/Family education, Balance training, Stair training, and DME instructions  PLAN FOR NEXT SESSION:  Continue weight progression/resistance with standing exercoses at counter.  Stair negotiation, curb negotiation, outdoor gait with cane   Greig Anon, PT 10/14/24 3:35 PM Phone: [phone] Fax: [phone]  [facility] at [facility] [address] [place], [place] [address] Phone # [phone] Fax # [phone]

## 2024-10-18 ENCOUNTER — Encounter: Payer: Self-pay | Admitting: Physical Therapy

## 2024-10-18 ENCOUNTER — Ambulatory Visit: Admitting: Physical Therapy

## 2024-10-18 DIAGNOSIS — R2689 Other abnormalities of gait and mobility: Secondary | ICD-10-CM

## 2024-10-18 DIAGNOSIS — R278 Other lack of coordination: Secondary | ICD-10-CM | POA: Diagnosis not present

## 2024-10-18 DIAGNOSIS — M25632 Stiffness of left wrist, not elsewhere classified: Secondary | ICD-10-CM | POA: Diagnosis not present

## 2024-10-18 DIAGNOSIS — M6281 Muscle weakness (generalized): Secondary | ICD-10-CM | POA: Diagnosis not present

## 2024-10-18 DIAGNOSIS — R2681 Unsteadiness on feet: Secondary | ICD-10-CM | POA: Diagnosis not present

## 2024-10-18 DIAGNOSIS — R208 Other disturbances of skin sensation: Secondary | ICD-10-CM | POA: Diagnosis not present

## 2024-10-18 NOTE — Therapy (Signed)
 OUTPATIENT PHYSICAL THERAPY NEURO TREATMENT NOTE  Patient Name: Monica Curry MRN: [id number] DOB:1931/10/04, 89 y.o., female Today's Date: 10/18/2024   PCP: Wendolyn Jenkins Jansky, MD REFERRING PROVIDER: Danton Lauraine LABOR, PA-C      END OF SESSION:  PT End of Session - 10/18/24 1103     Visit Number 11    Number of Visits 17    Date for Recertification  10/29/24    Authorization Type [employer]    Authorization Time Period 08/30/2024-10/29/2024    Authorization - Visit Number 11    Authorization - Number of Visits 17    PT Start Time 1105    PT Stop Time 1145    PT Time Calculation (min) 40 min    Equipment Utilized During Treatment Gait belt    Activity Tolerance Patient tolerated treatment well    Behavior During Therapy WFL for tasks assessed/performed                  Past Medical History:  Diagnosis Date   Allergy    Medications and pollen   Anemia    Anxiety    Always   C. difficile colitis    Cancer (HCC)    Endometrial cancer   Cataract    Had surgery 2021   Chronic kidney disease 2018   2 siblings had kidney failure, sister had transplant   Clotting disorder    Essential and other specified forms of tremor    Hyperlipidemia    Hypertension    Osteoporosis 11/2019   T score -2.8 stable from prior DEXA   Personal history of venous thrombosis and embolism 1997   Pulmonary embolism (HCC) 06/14/2021   Pulmonary embolus (HCC)    Shingles    Stroke (HCC) 2018   TIA   Thrush    TIA (transient ischemic attack)    carotid US  07/2022: no ICA stenosis bilaterally   Past Surgical History:  Procedure Laterality Date   ABDOMINAL HYSTERECTOMY  1997   APPENDECTOMY  1961   cataract surg Bilateral 2021   Dr. Roz, R eye 8/18, L 8/25   CESAREAN SECTION  1961   EYE SURGERY  2021   Cataract both eyes   FRACTURE SURGERY  2025   Broken hip   INTRAMEDULLARY (IM) NAIL INTERTROCHANTERIC Left 04/28/2024   Procedure: FIXATION, FRACTURE,  INTERTROCHANTERIC, WITH INTRAMEDULLARY ROD;  Surgeon: Kendal Franky SQUIBB, MD;  Location: [facility] OR;  Service: Orthopedics;  Laterality: Left;   TONSILLECTOMY AND ADENOIDECTOMY  1944   TOTAL ABDOMINAL HYSTERECTOMY W/ BILATERAL SALPINGOOPHORECTOMY  1997   Patient Active Problem List   Diagnosis Date Noted   RLS (restless legs syndrome) 06/15/2024   Fracture, Colles, left, closed 04/28/2024   Hip fracture (HCC) 04/27/2024   GAD (generalized anxiety disorder) 04/01/2024   Thyroid  nodule 01/10/2022   Anxiety 08/10/2021   Aortic atherosclerosis (HCC) - CT 06/2021 06/15/2021   Bilateral renal cysts 07/06/2020   Paroxysmal supraventricular tachycardia 10/20/2019   History of skin cancer (basal cell) 06/16/2019   Complete right bundle branch block 06/16/2019   B12 deficiency 06/15/2019   History of TIA (transient ischemic attack) 10/23/2017   Chronic kidney disease, stage 3b (HCC) 07/21/2015   Medication management 07/04/2014   Essential hypertension 11/25/2013   Hyperlipidemia, mixed 11/25/2013   Abnormal glucose 11/25/2013   Vitamin D  deficiency 11/25/2013   Essential tremor 11/25/2013   History of endometrial cancer    Hx/o DVT/PE    Urethral prolapse    Osteoporosis 10/10/2011  ONSET DATE: 04/28/2024  REFERRING DIAG: S/P L intertrachanteric femur fracture, Left distal radius fracture 04/28/2024   THERAPY DIAG:  Unsteadiness on feet  Other abnormalities of gait and mobility  Muscle weakness (generalized)  Rationale for Evaluation and Treatment: Rehabilitation  SUBJECTIVE:                                                                                                                                                                                             SUBJECTIVE STATEMENT: Went to church yesterday using the cane.  Been walking quite a bit with the cane.   Pt accompanied by: self  PERTINENT HISTORY: osteoporosis, hx of DVT; see additional PMH above  PAIN:  Are you  having pain? No   PRECAUTIONS: Fall, wears L compression stocking due to L LE edema (was negative for DVT)  **CTR surgery LUE 10/12/24:  wearing bandage for 5 days, no lifting, to return to [place] 11/18 for removal of stitches, follow up  RED FLAGS: None   WEIGHT BEARING RESTRICTIONS: No  FALLS: Has patient fallen in last 6 months? Yes. Number of falls 1  LIVING ENVIRONMENT: Lives with: lives alone Lives in: House/apartment Stairs: 3 steps to enter home;  Has following equipment at home: Single point cane and Walker - 2 wheeled *cane has tripod base and has gotten quad tip (per 10/28 visit) PLOF: Independent  PATIENT GOALS: To be able to transition to the cane, so I can get in and out of home better.  OBJECTIVE:    TODAY'S TREATMENT: 10/18/2024 Activity Comments  Seated leg strengthening: LAQ 3 x 10 Marching 3 x 10 4# BLE  Standing strengthening: Hip abduction 3 x 10 Hip extension 3 x 10 Standing march/hip and knee flexion 3 x 10 4#   Standing balance:   feet apart/feet together EO and EC head turns/head nods x 5 Feet partial tandem stance EO head turns/nods x 5, then EC head steady 30 sec Mild sway with EC  Sit to stand 2 x 5 reps EO, 2 x 5 reps EC  Initial standing balance with lateral rocking and upright posture, then initiate gait Second set sitting on compliant surface  Short distance gait in clinic:  25 ft, then 50 ft with no device CGA, decreased BLE step length, decreased LLE stance time               PATIENT EDUCATION: Education details:  Continue current HEP routine and try to use weight at home; gait safety at night (plenty of light, use of RW when getting out of bed)  Person educated: Patient Education method: Explanation, Demonstration, Tactile cues,  Verbal cues, and Handouts Education comprehension: verbalized understanding and returned demonstration    Access Code: [email or web address] URL: [email or web address] Date: 09/09/2024 Prepared by: [facility]  - Outpatient  Rehab - [facility]  Program Notes (per pt report 10/20-using 3# at home) Marching in place, side hip kicks, back hip kicks:  Do 3 sets of 10 with 2# weight  Exercises - Backward Walking with Counter Support  - 1 x daily - 5 x weekly - 1 sets - 5 reps - Alternating Step Taps with Counter Support  - 1 x daily - 5 x weekly - 3 sets - 10 reps - Side Stepping with Counter Support  - 1 x daily - 5 x weekly - 1 sets - 5 reps   -------------------------------------------- Note: Objective measures were completed at Evaluation unless otherwise noted.  DIAGNOSTIC FINDINGS: Interval proximal left femoral cephalomedullary nail fixation of the previously seen intertrochanteric fracture. Improved alignment.  COGNITION: Overall cognitive status: Within functional limits for tasks assessed   SENSATION: Reports decreased sensation in L fingers (to see hand surgeon)  EDEMA:  Noted LLE; wearing compression stocking  POSTURE: rounded shoulders and increased thoracic kyphosis  LOWER EXTREMITY ROM:     Active  Right Eval Left Eval  Hip flexion    Hip extension    Hip abduction    Hip adduction    Hip internal rotation    Hip external rotation    Knee flexion    Knee extension    Ankle dorsiflexion  10  Ankle plantarflexion  40  Ankle inversion    Ankle eversion     (Blank rows = not tested)  LOWER EXTREMITY MMT:    MMT Right Eval Left Eval  Hip flexion 4 3+  Hip extension    Hip abduction 5 4  Hip adduction 5 4  Hip internal rotation    Hip external rotation    Knee flexion 4+ 4+  Knee extension 4+ 4  Ankle dorsiflexion 4+ 4  Ankle plantarflexion    Ankle inversion    Ankle eversion    (Blank rows = not tested)  TRANSFERS: Sit to stand: Modified independence  Assistive device utilized: None     Stand to sit: Modified independence  Assistive device utilized: hands at chair      STAIRS: Findings: Level of Assistance: Modified independence,  Stair Negotiation Technique: Step to Pattern with Single Rail on Right, Number of Stairs: 2-3, Height of Stairs: 4-6   , and Comments: prior to fracture, pt negotiated steps alternating pattern GAIT: Findings: Gait Characteristics: step through pattern, decreased step length- Right, and decreased stance time- Left, Distance walked: 50 ft x 2, Assistive device utilized:Walker - 2 wheeled and SPC with small quad tip base, Level of assistance: Modified independence, CGA, and Min A, and Comments: Mod I with RW, CGA/min assist with cane  FUNCTIONAL TESTS:  10 meter walk test: 16.78 sec RW (1.95 ft/sec); 33.62 sec with cane (0.98 ft/sec) Berg:  34/56 (Scores <45/56 indicate increased fall risk)  TREATMENT DATE: 08/30/2024    PATIENT EDUCATION: Education details: Eval results, POC, continue HEP from HHPT; verbally added lateral weightshifting to LLE, with attention to light UE support Person educated: Patient Education method: Explanation, Demonstration, and Verbal cues Education comprehension: verbalized understanding, returned demonstration, and needs further education  HOME EXERCISE PROGRAM: Verbally initiated lateral weightshifting; pt doing standing hip kicks, heel/toe raises, marching and sit to stand at home  GOALS: Goals reviewed with patient? Yes  SHORT TERM GOALS: Target date: 10/01/2024  Pt will be independent with HEP for improved gait, stair negotiation, balance. Baseline: no concerns on HEP  09/29/24 Goal status: IN PROGRESS 09/29/24  2.  Pt will improve Berg score to at least 45/56 to decrease fall risk. Baseline: 34/56>44/56 09/27/2024 Goal status: PARTIALLY MET 09/27/2024  3.  Pt will negotiate curb and 3 steps with cane, mod I, for improved household and community gait. Baseline: stairs with cane with CGA  09/29/24 Goal status: IN PROGRESS   09/29/24   LONG TERM GOALS: Target date: 10/29/2024  Pt will be independent with HEP for improved balance, strength, gait, stairs. Baseline:  Goal status: IN PROGRESS  2.  Pt will improve DGI score to at least 20/24 to decrease fall risk. Baseline: 11/24 Goal status:IN PROGRESS  3.  Pt will improve gait velocity to at least 2.6 ft/sec with cane for improved gait efficiency and safety. Baseline: 0.98 ft/sec Goal status: IN PROGRESS  4.  Pt will ambulate at least 500 ft, indoor and outdoor gait, mod I with cane for improved community mobility. Baseline:  Goal status: IN PROGRESS   ASSESSMENT:  CLINICAL IMPRESSION: Pt presents today with reports of using cane more in community over the weekend without difficulty. Skilled PT session focused on BLE strengthening + functional strengthening/balance with sit to stand + standing balance EO/EC and varied foot positions.  Pt needs close supervision with EC, as she has mild sway.  Short distance gait with no device, and pt has decreased step length, decreased LLE stance time, but is overall steady for our first trial without device.  Pt will continue to benefit from skilled PT towards goals for improved functional mobility and decreased fall risk.  OBJECTIVE IMPAIRMENTS: Abnormal gait, decreased balance, decreased mobility, difficulty walking, and decreased strength.   ACTIVITY LIMITATIONS: standing, stairs, transfers, and locomotion level  PARTICIPATION LIMITATIONS: meal prep, cleaning, driving, shopping, community activity, yard work, and church  PERSONAL FACTORS: 3+ comorbidities: see PMH above are also affecting patient's functional outcome.   REHAB POTENTIAL: Good  CLINICAL DECISION MAKING: Evolving/moderate complexity  EVALUATION COMPLEXITY: Moderate  PLAN:  PT FREQUENCY: 2x/week  PT DURATION: 8 weeks plus eval visit  PLANNED INTERVENTIONS: 97750- Physical Performance Testing, 97110-Therapeutic exercises, 97530- Therapeutic  activity, [id number]- Neuromuscular re-education, 97535- Self Care, [id number]- Manual therapy, [id number]- Gait training, Patient/Family education, Balance training, Stair training, and DME instructions  PLAN FOR NEXT SESSION:  Standing balance EO/EC and add to HEP as appropriate.  Continue weight progression/resistance with standing exercises at counter.  Stair negotiation, curb negotiation, outdoor gait with cane; gait in clinic no device as appropriate.     Greig Anon, PT 10/18/24 11:51 AM Phone: [phone] Fax: [phone]  [facility] at [facility] [address] [place], [address] [place], [place] [address] Phone # [phone] Fax # [phone]

## 2024-10-19 ENCOUNTER — Ambulatory Visit: Attending: Physician Assistant | Admitting: Physician Assistant

## 2024-10-19 ENCOUNTER — Encounter: Payer: Self-pay | Admitting: Physician Assistant

## 2024-10-19 VITALS — BP 110/70 | HR 68 | Ht 63.5 in | Wt 133.2 lb

## 2024-10-19 DIAGNOSIS — I10 Essential (primary) hypertension: Secondary | ICD-10-CM | POA: Diagnosis not present

## 2024-10-19 DIAGNOSIS — I471 Supraventricular tachycardia, unspecified: Secondary | ICD-10-CM

## 2024-10-19 DIAGNOSIS — E782 Mixed hyperlipidemia: Secondary | ICD-10-CM | POA: Diagnosis not present

## 2024-10-19 DIAGNOSIS — R0789 Other chest pain: Secondary | ICD-10-CM

## 2024-10-19 DIAGNOSIS — Z86718 Personal history of other venous thrombosis and embolism: Secondary | ICD-10-CM | POA: Diagnosis not present

## 2024-10-19 NOTE — Patient Instructions (Signed)
 Medication Instructions:  Your physician recommends that you continue on your current medications as directed. Please refer to the Current Medication list given to you today.  *If you need a refill on your cardiac medications before your next appointment, please call your pharmacy*  Testing/Procedures: Lexiscan  Myocardial Perfusion Imaging Study.  Please arrive 15 minutes prior to your appointment time for registration and insurance purposes.   The test will take approximately 3 to 4 hours to complete; you may bring reading material.  If someone comes with you to your appointment, they will need to remain in the main lobby due to limited space in the testing area. **If you are pregnant or breastfeeding, please notify the nuclear lab prior to your appointment**   How to prepare for your Myocardial Perfusion Test: Do not eat or drink 3 hours prior to your test, except you may have water. Do not consume products containing caffeine (regular or decaffeinated) 12 hours prior to your test. (ex: coffee, chocolate, sodas, tea). Do wear comfortable clothes (no dresses or overalls) and walking shoes, tennis shoes preferred (No heels or open toe shoes are allowed). Do NOT wear cologne, perfume, aftershave, or lotions (deodorant is allowed). If you use an inhaler, use it the AM of your test and bring it with you.  If you use a nebulizer, use it the AM of your test.  If these instructions are not followed, your test will have to be rescheduled.   Follow-Up: At [facility], you and your health needs are our priority.  As part of our continuing mission to provide you with exceptional heart care, our providers are all part of one team.  This team includes your primary Cardiologist (physician) and Advanced Practice Providers or APPs (Physician Assistants and Nurse Practitioners) who all work together to provide you with the care you need, when you need it.  Your next appointment:   6  month(s)  Provider:   Vina Gull, MD

## 2024-10-20 ENCOUNTER — Ambulatory Visit

## 2024-10-20 ENCOUNTER — Ambulatory Visit: Admitting: Orthopedic Surgery

## 2024-10-20 DIAGNOSIS — M6281 Muscle weakness (generalized): Secondary | ICD-10-CM | POA: Diagnosis not present

## 2024-10-20 DIAGNOSIS — M25632 Stiffness of left wrist, not elsewhere classified: Secondary | ICD-10-CM | POA: Diagnosis not present

## 2024-10-20 DIAGNOSIS — R208 Other disturbances of skin sensation: Secondary | ICD-10-CM | POA: Diagnosis not present

## 2024-10-20 DIAGNOSIS — R2681 Unsteadiness on feet: Secondary | ICD-10-CM

## 2024-10-20 DIAGNOSIS — R2689 Other abnormalities of gait and mobility: Secondary | ICD-10-CM

## 2024-10-20 DIAGNOSIS — R278 Other lack of coordination: Secondary | ICD-10-CM | POA: Diagnosis not present

## 2024-10-20 NOTE — Therapy (Signed)
 OUTPATIENT PHYSICAL THERAPY NEURO TREATMENT NOTE  Patient Name: Monica Curry MRN: [id number] DOB:1931-02-13, 89 y.o., female Today's Date: 10/20/2024   PCP: Wendolyn Jenkins Jansky, MD REFERRING PROVIDER: Danton Lauraine LABOR, PA-C      END OF SESSION:  PT End of Session - 10/20/24 1145     Visit Number 12    Number of Visits 17    Date for Recertification  10/29/24    Authorization Type [employer]    Authorization Time Period 08/30/2024-10/29/2024    Authorization - Visit Number 12    Authorization - Number of Visits 17    PT Start Time 1145    PT Stop Time 1230    PT Time Calculation (min) 45 min    Equipment Utilized During Treatment Gait belt    Activity Tolerance Patient tolerated treatment well    Behavior During Therapy WFL for tasks assessed/performed                  Past Medical History:  Diagnosis Date   Allergy    Medications and pollen   Anemia    Anxiety    Always   C. difficile colitis    Cancer (HCC)    Endometrial cancer   Cataract    Had surgery 2021   Chronic kidney disease 2018   2 siblings had kidney failure, sister had transplant   Clotting disorder    Essential and other specified forms of tremor    Hyperlipidemia    Hypertension    Osteoporosis 11/2019   T score -2.8 stable from prior DEXA   Personal history of venous thrombosis and embolism 1997   Pulmonary embolism (HCC) 06/14/2021   Pulmonary embolus (HCC)    Shingles    Stroke (HCC) 2018   TIA   Thrush    TIA (transient ischemic attack)    carotid US  07/2022: no ICA stenosis bilaterally   Past Surgical History:  Procedure Laterality Date   ABDOMINAL HYSTERECTOMY  1997   APPENDECTOMY  1961   cataract surg Bilateral 2021   Dr. Roz, R eye 8/18, L 8/25   CESAREAN SECTION  1961   EYE SURGERY  2021   Cataract both eyes   FRACTURE SURGERY  2025   Broken hip   INTRAMEDULLARY (IM) NAIL INTERTROCHANTERIC Left 04/28/2024   Procedure: FIXATION, FRACTURE,  INTERTROCHANTERIC, WITH INTRAMEDULLARY ROD;  Surgeon: Kendal Franky SQUIBB, MD;  Location: [facility] OR;  Service: Orthopedics;  Laterality: Left;   TONSILLECTOMY AND ADENOIDECTOMY  1944   TOTAL ABDOMINAL HYSTERECTOMY W/ BILATERAL SALPINGOOPHORECTOMY  1997   Patient Active Problem List   Diagnosis Date Noted   RLS (restless legs syndrome) 06/15/2024   Fracture, Col[place], left, closed 04/28/2024   Hip fracture (HCC) 04/27/2024   GAD (generalized anxiety disorder) 04/01/2024   Thyroid  nodule 01/10/2022   Anxiety 08/10/2021   Aortic atherosclerosis (HCC) - CT 06/2021 06/15/2021   Bilateral renal cysts 07/06/2020   Paroxysmal supraventricular tachycardia 10/20/2019   History of skin cancer (basal cell) 06/16/2019   Complete right bundle branch block 06/16/2019   B12 deficiency 06/15/2019   History of TIA (transient ischemic attack) 10/23/2017   Chronic kidney disease, stage 3b (HCC) 07/21/2015   Medication management 07/04/2014   Essential hypertension 11/25/2013   Hyperlipidemia, mixed 11/25/2013   Abnormal glucose 11/25/2013   Vitamin D  deficiency 11/25/2013   Essential tremor 11/25/2013   History of endometrial cancer    Hx/o DVT/PE    Urethral prolapse    Osteoporosis 10/10/2011  ONSET DATE: 04/28/2024  REFERRING DIAG: S/P L intertrachanteric femur fracture, Left distal radius fracture 04/28/2024   THERAPY DIAG:  Unsteadiness on feet  Other abnormalities of gait and mobility  Muscle weakness (generalized)  Rationale for Evaluation and Treatment: Rehabilitation  SUBJECTIVE:                                                                                                                                                                                             SUBJECTIVE STATEMENT: Doing ok, having a bit of discomfort in the right calf and left hip, felt better after getting up and walking around.    Pt accompanied by: self  PERTINENT HISTORY: osteoporosis, hx of DVT; see  additional PMH above  PAIN:  Are you having pain? No   PRECAUTIONS: Fall, wears L compression stocking due to L LE edema (was negative for DVT)  **CTR surgery LUE 10/12/24:  wearing bandage for 5 days, no lifting, to return to MD 11/18 for removal of stitches, follow up  RED FLAGS: None   WEIGHT BEARING RESTRICTIONS: No  FALLS: Has patient fallen in last 6 months? Yes. Number of falls 1  LIVING ENVIRONMENT: Lives with: lives alone Lives in: House/apartment Stairs: 3 steps to enter home;  Has following equipment at home: Single point cane and Walker - 2 wheeled *cane has tripod base and has gotten quad tip (per 10/28 visit) PLOF: Independent  PATIENT GOALS: To be able to transition to the cane, so I can get in and out of home better.  OBJECTIVE:   TODAY'S TREATMENT: 10/20/24 Activity Comments  HEP review    Corner balance  For HEP additions for static balance/postural control  w/ cane Stair ambulation 235 ft = 1.95 ft/sec  Static multisensory balance   Weight shift and single limb support 2x10 on airex pad All planes, shift and lift        TODAY'S TREATMENT: 10/18/2024 Activity Comments  Seated leg strengthening: LAQ 3 x 10 Marching 3 x 10 4# BLE  Standing strengthening: Hip abduction 3 x 10 Hip extension 3 x 10 Standing march/hip and knee flexion 3 x 10 4#   Standing balance:   feet apart/feet together EO and EC head turns/head nods x 5 Feet partial tandem stance EO head turns/nods x 5, then EC head steady 30 sec Mild sway with EC  Sit to stand 2 x 5 reps EO, 2 x 5 reps EC  Initial standing balance with lateral rocking and upright posture, then initiate gait Second set sitting on compliant surface  Short distance gait in clinic:  25 ft, then 50 ft with  no device CGA, decreased BLE step length, decreased LLE stance time               PATIENT EDUCATION: Education details:  Continue current HEP routine and try to use weight at home; gait safety at  night (plenty of light, use of RW when getting out of bed)  Person educated: Patient Education method: Explanation, Demonstration, Tactile cues, Verbal cues, and Handouts Education comprehension: verbalized understanding and returned demonstration    Access Code: [email or web address] URL: [email or web address] Date: 09/09/2024 Prepared by: [facility] - [facility]  Program Notes (per pt report 10/20-using 3# at home) Marching in place, side hip kicks, back hip kicks:  Do 3 sets of 10 with 2# weight  Exercises - Backward Walking with Counter Support  - 1 x daily - 5 x weekly - 1 sets - 5 reps - Alternating Step Taps with Counter Support  - 1 x daily - 5 x weekly - 3 sets - 10 reps - Side Stepping with Counter Support  - 1 x daily - 5 x weekly - 1 sets - 5 reps - Corner Balance Feet Together With Eyes Closed  - 1 x daily - 7 x weekly - 3 reps - 30 sec hold - Corner Balance Feet Together: Eyes Open With Head Turns  - 1 x daily - 7 x weekly - 3 reps - 30 sec hold - Corner Balance Feet Together: Eyes Closed With Head Turns  - 1 x daily - 7 x weekly - 3 reps - 30 sec hold - Semi-Tandem Corner Balance With Eyes Open  - 1 x daily - 7 x weekly - 3 reps - 15-30 sec hold   -------------------------------------------- Note: Objective measures were completed at Evaluation unless otherwise noted.  DIAGNOSTIC FINDINGS: Interval proximal left femoral cephalomedullary nail fixation of the previously seen intertrochanteric fracture. Improved alignment.  COGNITION: Overall cognitive status: Within functional limits for tasks assessed   SENSATION: Reports decreased sensation in L fingers (to see hand surgeon)  EDEMA:  Noted LLE; wearing compression stocking  POSTURE: rounded shoulders and increased thoracic kyphosis  LOWER EXTREMITY ROM:     Active  Right Eval Left Eval  Hip flexion    Hip extension    Hip abduction    Hip adduction    Hip internal rotation     Hip external rotation    Knee flexion    Knee extension    Ankle dorsiflexion  10  Ankle plantarflexion  40  Ankle inversion    Ankle eversion     (Blank rows = not tested)  LOWER EXTREMITY MMT:    MMT Right Eval Left Eval  Hip flexion 4 3+  Hip extension    Hip abduction 5 4  Hip adduction 5 4  Hip internal rotation    Hip external rotation    Knee flexion 4+ 4+  Knee extension 4+ 4  Ankle dorsiflexion 4+ 4  Ankle plantarflexion    Ankle inversion    Ankle eversion    (Blank rows = not tested)  TRANSFERS: Sit to stand: Modified independence  Assistive device utilized: None     Stand to sit: Modified independence  Assistive device utilized: hands at chair      STAIRS: Findings: Level of Assistance: Modified independence, Stair Negotiation Technique: Step to Pattern with Single Rail on Right, Number of Stairs: 2-3, Height of Stairs: 4-6   , and Comments: prior to fracture, pt negotiated steps alternating pattern GAIT:  Findings: Gait Characteristics: step through pattern, decreased step length- Right, and decreased stance time- Left, Distance walked: 50 ft x 2, Assistive device utilized:Walker - 2 wheeled and SPC with small quad tip base, Level of assistance: Modified independence, CGA, and Min A, and Comments: Mod I with RW, CGA/min assist with cane  FUNCTIONAL TESTS:  10 meter walk test: 16.78 sec RW (1.95 ft/sec); 33.62 sec with cane (0.98 ft/sec) Berg:  34/56 (Scores <45/56 indicate increased fall risk)                                                                                                                                TREATMENT DATE: 08/30/2024    PATIENT EDUCATION: Education details: Eval results, POC, continue HEP from HHPT; verbally added lateral weightshifting to LLE, with attention to light UE support Person educated: Patient Education method: Explanation, Demonstration, and Verbal cues Education comprehension: verbalized understanding, returned  demonstration, and needs further education  HOME EXERCISE PROGRAM: Verbally initiated lateral weightshifting; pt doing standing hip kicks, heel/toe raises, marching and sit to stand at home  GOALS: Goals reviewed with patient? Yes  SHORT TERM GOALS: Target date: 10/01/2024  Pt will be independent with HEP for improved gait, stair negotiation, balance. Baseline: no concerns on HEP  09/29/24 Goal status: IN PROGRESS 09/29/24  2.  Pt will improve Berg score to at least 45/56 to decrease fall risk. Baseline: 34/56>44/56 09/27/2024 Goal status: PARTIALLY MET 09/27/2024  3.  Pt will negotiate curb and 3 steps with cane, mod I, for improved household and community gait. Baseline: stairs with cane with CGA  09/29/24 Goal status: IN PROGRESS  09/29/24   LONG TERM GOALS: Target date: 10/29/2024  Pt will be independent with HEP for improved balance, strength, gait, stairs. Baseline:  Goal status: IN PROGRESS  2.  Pt will improve DGI score to at least 20/24 to decrease fall risk. Baseline: 11/24 Goal status:IN PROGRESS  3.  Pt will improve gait velocity to at least 2.6 ft/sec with cane for improved gait efficiency and safety. Baseline: 0.98 ft/sec Goal status: IN PROGRESS  4.  Pt will ambulate at least 500 ft, indoor and outdoor gait, mod I with cane for improved community mobility. Baseline:  Goal status: IN PROGRESS   ASSESSMENT:  CLINICAL IMPRESSION: Review of current HEP with good recall and performance. Instructed in additional static multisensory balance activities to improve postural stability/awareness for safety with unsupported standing and ADL performance.  Gait training w/ cane level surfaces and travels 235 ft w/ speed of 1.95 ft/sec and discussed speed for community mobility. Stair ambulation supervision w/ cane and cues in sequence, encouraged trying to walk stairs at home w/ family SBA-CGA to improve leg strength and acclimate to demands. Ended session with  static to dynamic balance activities with emphasis on weight shift and single limb support to improve gait mechanics/postural control OBJECTIVE IMPAIRMENTS: Abnormal gait, decreased balance, decreased mobility, difficulty walking, and decreased  strength.   ACTIVITY LIMITATIONS: standing, stairs, transfers, and locomotion level  PARTICIPATION LIMITATIONS: meal prep, cleaning, driving, shopping, community activity, yard work, and church  PERSONAL FACTORS: 3+ comorbidities: see PMH above are also affecting patient's functional outcome.   REHAB POTENTIAL: Good  CLINICAL DECISION MAKING: Evolving/moderate complexity  EVALUATION COMPLEXITY: Moderate  PLAN:  PT FREQUENCY: 2x/week  PT DURATION: 8 weeks plus eval visit  PLANNED INTERVENTIONS: 97750- Physical Performance Testing, 97110-Therapeutic exercises, 97530- Therapeutic activity, [id number]- Neuromuscular re-education, 97535- Self Care, [id number]- Manual therapy, [phone]- Gait training, Patient/Family education, Balance training, Stair training, and DME instructions  PLAN FOR NEXT SESSION:  .  Continue weight progression/resistance with standing exercises at counter.  Stair negotiation, curb negotiation, outdoor gait with cane; gait in clinic no device as appropriate.    12:33 PM, 10/20/24 M. Kelly Athina Fahey, PT, DPT Physical Therapist- [place] Office Number: [phone]

## 2024-10-25 ENCOUNTER — Encounter: Payer: Self-pay | Admitting: Physical Therapy

## 2024-10-25 ENCOUNTER — Ambulatory Visit: Admitting: Physical Therapy

## 2024-10-25 DIAGNOSIS — M25632 Stiffness of left wrist, not elsewhere classified: Secondary | ICD-10-CM | POA: Diagnosis not present

## 2024-10-25 DIAGNOSIS — R2689 Other abnormalities of gait and mobility: Secondary | ICD-10-CM

## 2024-10-25 DIAGNOSIS — R2681 Unsteadiness on feet: Secondary | ICD-10-CM | POA: Diagnosis not present

## 2024-10-25 DIAGNOSIS — R278 Other lack of coordination: Secondary | ICD-10-CM | POA: Diagnosis not present

## 2024-10-25 DIAGNOSIS — M6281 Muscle weakness (generalized): Secondary | ICD-10-CM

## 2024-10-25 DIAGNOSIS — R208 Other disturbances of skin sensation: Secondary | ICD-10-CM | POA: Diagnosis not present

## 2024-10-25 NOTE — Therapy (Signed)
 OUTPATIENT PHYSICAL THERAPY NEURO TREATMENT NOTE  Patient Name: Monica Curry MRN: [id number] DOB:[birth year], 89 y.o., female Today's Date: 10/25/2024   PCP: Wendolyn Jenkins Jansky, MD REFERRING PROVIDER: Danton Lauraine LABOR, PA-C      END OF SESSION:  PT End of Session - 10/25/24 1107     Visit Number 13    Number of Visits 17    Date for Recertification  10/29/24    Authorization Type [employer]    Authorization Time Period 08/30/2024-10/29/2024    Authorization - Visit Number 13    Authorization - Number of Visits 17    PT Start Time 1105    PT Stop Time 1147    PT Time Calculation (min) 42 min    Equipment Utilized During Treatment Gait belt    Activity Tolerance Patient tolerated treatment well    Behavior During Therapy WFL for tasks assessed/performed                   Past Medical History:  Diagnosis Date   Allergy    Medications and pollen   Anemia    Anxiety    Always   C. difficile colitis    Cancer (HCC)    Endometrial cancer   Cataract    Had surgery 2021   Chronic kidney disease 2018   2 siblings had kidney failure, sister had transplant   Clotting disorder    Essential and other specified forms of tremor    Hyperlipidemia    Hypertension    Osteoporosis 11/2019   T score -2.8 stable from prior DEXA   Personal history of venous thrombosis and embolism 1997   Pulmonary embolism (HCC) 06/14/2021   Pulmonary embolus (HCC)    Shingles    Stroke (HCC) 2018   TIA   Thrush    TIA (transient ischemic attack)    carotid US  07/2022: no ICA stenosis bilaterally   Past Surgical History:  Procedure Laterality Date   ABDOMINAL HYSTERECTOMY  1997   APPENDECTOMY  1961   cataract surg Bilateral 2021   Dr. Roz, R eye 8/18, L 8/25   CESAREAN SECTION  1961   EYE SURGERY  2021   Cataract both eyes   FRACTURE SURGERY  2025   Broken hip   INTRAMEDULLARY (IM) NAIL INTERTROCHANTERIC Left 04/28/2024   Procedure: FIXATION, FRACTURE,  INTERTROCHANTERIC, WITH INTRAMEDULLARY ROD;  Surgeon: Kendal Franky SQUIBB, MD;  Location: [facility] OR;  Service: Orthopedics;  Laterality: Left;   TONSILLECTOMY AND ADENOIDECTOMY  1944   TOTAL ABDOMINAL HYSTERECTOMY W/ BILATERAL SALPINGOOPHORECTOMY  1997   Patient Active Problem List   Diagnosis Date Noted   RLS (restless legs syndrome) 06/15/2024   Fracture, Colles, left, closed 04/28/2024   Hip fracture (HCC) 04/27/2024   GAD (generalized anxiety disorder) 04/01/2024   Thyroid  nodule 01/10/2022   Anxiety 08/10/2021   Aortic atherosclerosis (HCC) - CT 06/2021 06/15/2021   Bilateral renal cysts 07/06/2020   Paroxysmal supraventricular tachycardia 10/20/2019   History of skin cancer (basal cell) 06/16/2019   Complete right bundle branch block 06/16/2019   B12 deficiency 06/15/2019   History of TIA (transient ischemic attack) 10/23/2017   Chronic kidney disease, stage 3b (HCC) 07/21/2015   Medication management 07/04/2014   Essential hypertension 11/25/2013   Hyperlipidemia, mixed 11/25/2013   Abnormal glucose 11/25/2013   Vitamin D  deficiency 11/25/2013   Essential tremor 11/25/2013   History of endometrial cancer    Hx/o DVT/PE    Urethral prolapse    Osteoporosis 10/10/2011  ONSET DATE: 04/28/2024  REFERRING DIAG: S/P L intertrachanteric femur fracture, Left distal radius fracture 04/28/2024   THERAPY DIAG:  Unsteadiness on feet  Other abnormalities of gait and mobility  Muscle weakness (generalized)  Rationale for Evaluation and Treatment: Rehabilitation  SUBJECTIVE:                                                                                                                                                                                             SUBJECTIVE STATEMENT: Had a busy weekend.  Did a lot of shopping with my daughter over the weekend-went into all the stores with my cane.  Went down the steps into the basement with daughter's supervision.  Pt accompanied by:  self  PERTINENT HISTORY: osteoporosis, hx of DVT; see additional PMH above  PAIN:  Are you having pain? No   PRECAUTIONS: Fall, wears L compression stocking due to L LE edema (was negative for DVT)  **CTR surgery LUE 10/12/24:  wearing bandage for 5 days, no lifting, to return to MD 11/18 for removal of stitches, follow up  RED FLAGS: None   WEIGHT BEARING RESTRICTIONS: No  FALLS: Has patient fallen in last 6 months? Yes. Number of falls 1  LIVING ENVIRONMENT: Lives with: lives alone Lives in: House/apartment Stairs: 3 steps to enter home;  Has following equipment at home: Single point cane and Walker - 2 wheeled *cane has tripod base and has gotten quad tip (per 10/28 visit) PLOF: Independent  PATIENT GOALS: To be able to transition to the cane, so I can get in and out of home better.  OBJECTIVE:    TODAY'S TREATMENT: 10/25/2024 Activity Comments  HEP review of corner balance ex Good return demo  In parallel bars: -forward/back tandem gait -forward/back tandem march -forward gait EC, backwards gait EO  Standing on Airex beam:  EO/EC feet apart, cues to lessen posterior lean, 4 x 30 sec Light to intermittent UE support  Gait with no device, forward 20-25 ft, 8 reps Cues to increase LLE stance time, to look ahead, to relax arm swing for more confident gait  Gait with cane, head turns Head nods Step over obstacles Step around obstacles  Supervision/mod I                  PATIENT EDUCATION: Education details:  Try short distance gait without device, with upright posture, arm swing, increased step length Person educated: Patient Education method: Explanation, Demonstration, and Verbal cues Education comprehension: verbalized understanding and returned demonstration    Access Code: [email or web address] URL: [email or web address] Date: 09/09/2024 Prepared by: [facility] - Outpatient  Rehab - [facility]  Program Notes (per pt report 10/20-using 3# at  home) Marching in place, side hip kicks, back hip kicks:  Do 3 sets of 10 with 2# weight  Exercises - Backward Walking with Counter Support  - 1 x daily - 5 x weekly - 1 sets - 5 reps - Alternating Step Taps with Counter Support  - 1 x daily - 5 x weekly - 3 sets - 10 reps - Side Stepping with Counter Support  - 1 x daily - 5 x weekly - 1 sets - 5 reps - Corner Balance Feet Together With Eyes Closed  - 1 x daily - 7 x weekly - 3 reps - 30 sec hold - Corner Balance Feet Together: Eyes Open With Head Turns  - 1 x daily - 7 x weekly - 3 reps - 30 sec hold - Corner Balance Feet Together: Eyes Closed With Head Turns  - 1 x daily - 7 x weekly - 3 reps - 30 sec hold - Semi-Tandem Corner Balance With Eyes Open  - 1 x daily - 7 x weekly - 3 reps - 15-30 sec hold   -------------------------------------------- Note: Objective measures were completed at Evaluation unless otherwise noted.  DIAGNOSTIC FINDINGS: Interval proximal left femoral cephalomedullary nail fixation of the previously seen intertrochanteric fracture. Improved alignment.  COGNITION: Overall cognitive status: Within functional limits for tasks assessed   SENSATION: Reports decreased sensation in L fingers (to see hand surgeon)  EDEMA:  Noted LLE; wearing compression stocking  POSTURE: rounded shoulders and increased thoracic kyphosis  LOWER EXTREMITY ROM:     Active  Right Eval Left Eval  Hip flexion    Hip extension    Hip abduction    Hip adduction    Hip internal rotation    Hip external rotation    Knee flexion    Knee extension    Ankle dorsiflexion  10  Ankle plantarflexion  40  Ankle inversion    Ankle eversion     (Blank rows = not tested)  LOWER EXTREMITY MMT:    MMT Right Eval Left Eval  Hip flexion 4 3+  Hip extension    Hip abduction 5 4  Hip adduction 5 4  Hip internal rotation    Hip external rotation    Knee flexion 4+ 4+  Knee extension 4+ 4  Ankle dorsiflexion 4+ 4  Ankle  plantarflexion    Ankle inversion    Ankle eversion    (Blank rows = not tested)  TRANSFERS: Sit to stand: Modified independence  Assistive device utilized: None     Stand to sit: Modified independence  Assistive device utilized: hands at chair      STAIRS: Findings: Level of Assistance: Modified independence, Stair Negotiation Technique: Step to Pattern with Single Rail on Right, Number of Stairs: 2-3, Height of Stairs: 4-6   , and Comments: prior to fracture, pt negotiated steps alternating pattern GAIT: Findings: Gait Characteristics: step through pattern, decreased step length- Right, and decreased stance time- Left, Distance walked: 50 ft x 2, Assistive device utilized:Walker - 2 wheeled and SPC with small quad tip base, Level of assistance: Modified independence, CGA, and Min A, and Comments: Mod I with RW, CGA/min assist with cane  FUNCTIONAL TESTS:  10 meter walk test: 16.78 sec RW (1.95 ft/sec); 33.62 sec with cane (0.98 ft/sec) Berg:  34/56 (Scores <45/56 indicate increased fall risk)  TREATMENT DATE: 08/30/2024    PATIENT EDUCATION: Education details: Eval results, POC, continue HEP from HHPT; verbally added lateral weightshifting to LLE, with attention to light UE support Person educated: Patient Education method: Explanation, Demonstration, and Verbal cues Education comprehension: verbalized understanding, returned demonstration, and needs further education  HOME EXERCISE PROGRAM: Verbally initiated lateral weightshifting; pt doing standing hip kicks, heel/toe raises, marching and sit to stand at home  GOALS: Goals reviewed with patient? Yes  SHORT TERM GOALS: Target date: 10/01/2024  Pt will be independent with HEP for improved gait, stair negotiation, balance. Baseline: no concerns on HEP  09/29/24 Goal status: IN PROGRESS  09/29/24  2.  Pt will improve Berg score to at least 45/56 to decrease fall risk. Baseline: 34/56>44/56 09/27/2024 Goal status: PARTIALLY MET 09/27/2024  3.  Pt will negotiate curb and 3 steps with cane, mod I, for improved household and community gait. Baseline: stairs with cane with CGA  09/29/24 Goal status: IN PROGRESS  09/29/24   LONG TERM GOALS: Target date: 10/29/2024  Pt will be independent with HEP for improved balance, strength, gait, stairs. Baseline:  Goal status: IN PROGRESS  2.  Pt will improve DGI score to at least 20/24 to decrease fall risk. Baseline: 11/24 Goal status:IN PROGRESS  3.  Pt will improve gait velocity to at least 2.6 ft/sec with cane for improved gait efficiency and safety. Baseline: 0.98 ft/sec Goal status: IN PROGRESS  4.  Pt will ambulate at least 500 ft, indoor and outdoor gait, mod I with cane for improved community mobility. Baseline:  Goal status: IN PROGRESS   ASSESSMENT:  CLINICAL IMPRESSION: Pt presents today with reports of doing a good deal of community gait with cane, with her daughter over the weekend. Skilled PT session focused on review of balance exercises, progression of dynamic gait and balance, gait training without cane. Pt needs cues for posture, arm swing, step length with gait without device, and improves with repetition in session today.  She is to get off her bandage and stitches removed tomorrow, from her CTR surgery and will begin OT again later this week. Pt will continue to benefit from skilled PT towards goals for improved independent functional mobility and decreased fall risk.  OBJECTIVE IMPAIRMENTS: Abnormal gait, decreased balance, decreased mobility, difficulty walking, and decreased strength.   ACTIVITY LIMITATIONS: standing, stairs, transfers, and locomotion level  PARTICIPATION LIMITATIONS: meal prep, cleaning, driving, shopping, community activity, yard work, and church  PERSONAL FACTORS: 3+ comorbidities:  see PMH above are also affecting patient's functional outcome.   REHAB POTENTIAL: Good  CLINICAL DECISION MAKING: Evolving/moderate complexity  EVALUATION COMPLEXITY: Moderate  PLAN:  PT FREQUENCY: 2x/week  PT DURATION: 8 weeks plus eval visit  PLANNED INTERVENTIONS: 97750- Physical Performance Testing, 97110-Therapeutic exercises, 97530- Therapeutic activity, [id number]- Neuromuscular re-education, 97535- Self Care, [id number]- Manual therapy, [phone]- Gait training, Patient/Family education, Balance training, Stair training, and DME instructions  PLAN FOR NEXT SESSION:  Check Berg and Check LTGs.  Continue weight progression/resistance with standing exercises at counter.  Stair negotiation, curb negotiation, outdoor gait with cane; gait in clinic no device as appropriate.    Greig Anon, PT 10/25/24 11:50 AM Phone: [phone] Fax: [phone]  [facility] at [facility] [address] [place], [address] [place], [place] [address] Phone # [phone] Fax # [phone]

## 2024-10-26 ENCOUNTER — Ambulatory Visit: Admitting: Orthopedic Surgery

## 2024-10-26 ENCOUNTER — Telehealth: Payer: Self-pay

## 2024-10-26 DIAGNOSIS — Z9889 Other specified postprocedural states: Secondary | ICD-10-CM

## 2024-10-26 NOTE — Telephone Encounter (Signed)
 Pt called stating that her incision seemed a bit open and was wondering what to put on it. I advised not to put any topicals on her incision. She was okay with this and agreed and said she will put a bandaid over the incision.

## 2024-10-26 NOTE — Progress Notes (Unsigned)
   Monica Curry - 89 y.o. female MRN [id number]  Date of birth: 1931/05/08  Office Visit Note: Visit Date: 10/26/2024 PCP: Wendolyn Jenkins Jansky, MD Referred by: Wendolyn Jenkins Jansky, MD  Subjective:  HPI: Monica Curry is a 89 y.o. female who presents today for follow up 2 weeks status post left wrist open carpal tunnel release.  Pertinent ROS were reviewed with the patient and found to be negative unless otherwise specified above in HPI.   Assessment & Plan: Visit Diagnoses: No diagnosis found.  Plan: ***  Follow-up: No follow-ups on file.   Meds & Orders: No orders of the defined types were placed in this encounter.  No orders of the defined types were placed in this encounter.    Procedures: No procedures performed       Objective:   Vital Signs: There were no vitals taken for this visit.  Ortho Exam ***  Imaging: No results found.   Shirley Bolle Afton Alderton, M.D. [facility], Hand Surgery

## 2024-10-27 ENCOUNTER — Encounter: Admitting: Rehabilitative and Restorative Service Providers"

## 2024-10-27 ENCOUNTER — Encounter: Payer: Self-pay | Admitting: Physical Therapy

## 2024-10-27 ENCOUNTER — Ambulatory Visit: Admitting: Occupational Therapy

## 2024-10-27 ENCOUNTER — Other Ambulatory Visit: Payer: Self-pay

## 2024-10-27 ENCOUNTER — Ambulatory Visit: Admitting: Physical Therapy

## 2024-10-27 DIAGNOSIS — M25632 Stiffness of left wrist, not elsewhere classified: Secondary | ICD-10-CM

## 2024-10-27 DIAGNOSIS — R208 Other disturbances of skin sensation: Secondary | ICD-10-CM | POA: Diagnosis not present

## 2024-10-27 DIAGNOSIS — M6281 Muscle weakness (generalized): Secondary | ICD-10-CM | POA: Diagnosis not present

## 2024-10-27 DIAGNOSIS — R2681 Unsteadiness on feet: Secondary | ICD-10-CM | POA: Diagnosis not present

## 2024-10-27 DIAGNOSIS — R278 Other lack of coordination: Secondary | ICD-10-CM | POA: Diagnosis not present

## 2024-10-27 DIAGNOSIS — R2689 Other abnormalities of gait and mobility: Secondary | ICD-10-CM | POA: Diagnosis not present

## 2024-10-27 NOTE — Therapy (Signed)
 OUTPATIENT PHYSICAL THERAPY NEURO TREATMENT NOTE/RECERT  Patient Name: Monica Curry MRN: [id number] DOB:[birth year], 89 y.o., female Today's Date: 10/27/2024   PCP: Wendolyn Jenkins Jansky, MD REFERRING PROVIDER: Danton Lauraine LABOR, PA-C      END OF SESSION:  PT End of Session - 10/27/24 1101     Visit Number 14    Number of Visits 26    Date for Recertification  12/10/24    Authorization Type [employer]-reauth submitted    Authorization Time Period 08/30/2024-10/29/2024    Authorization - Visit Number 14    Authorization - Number of Visits 17    Progress Note Due on Visit 20    PT Start Time 1105    PT Stop Time 1144    PT Time Calculation (min) 39 min    Equipment Utilized During Treatment Gait belt    Activity Tolerance Patient tolerated treatment well    Behavior During Therapy WFL for tasks assessed/performed                    Past Medical History:  Diagnosis Date   Allergy    Medications and pollen   Anemia    Anxiety    Always   C. difficile colitis    Cancer (HCC)    Endometrial cancer   Cataract    Had surgery 2021   Chronic kidney disease 2018   2 siblings had kidney failure, sister had transplant   Clotting disorder    Essential and other specified forms of tremor    Hyperlipidemia    Hypertension    Osteoporosis 11/2019   T score -2.8 stable from prior DEXA   Personal history of venous thrombosis and embolism 1997   Pulmonary embolism (HCC) 06/14/2021   Pulmonary embolus (HCC)    Shingles    Stroke (HCC) 2018   TIA   Thrush    TIA (transient ischemic attack)    carotid US  07/2022: no ICA stenosis bilaterally   Past Surgical History:  Procedure Laterality Date   ABDOMINAL HYSTERECTOMY  1997   APPENDECTOMY  1961   cataract surg Bilateral 2021   Dr. Roz, R eye 8/18, L 8/25   CESAREAN SECTION  1961   EYE SURGERY  2021   Cataract both eyes   FRACTURE SURGERY  2025   Broken hip   INTRAMEDULLARY (IM) NAIL INTERTROCHANTERIC  Left 04/28/2024   Procedure: FIXATION, FRACTURE, INTERTROCHANTERIC, WITH INTRAMEDULLARY ROD;  Surgeon: Kendal Franky SQUIBB, MD;  Location: [facility] OR;  Service: Orthopedics;  Laterality: Left;   TONSILLECTOMY AND ADENOIDECTOMY  1944   TOTAL ABDOMINAL HYSTERECTOMY W/ BILATERAL SALPINGOOPHORECTOMY  1997   Patient Active Problem List   Diagnosis Date Noted   RLS (restless legs syndrome) 06/15/2024   Fracture, Col[place], left, closed 04/28/2024   Hip fracture (HCC) 04/27/2024   GAD (generalized anxiety disorder) 04/01/2024   Thyroid  nodule 01/10/2022   Anxiety 08/10/2021   Aortic atherosclerosis (HCC) - CT 06/2021 06/15/2021   Bilateral renal cysts 07/06/2020   Paroxysmal supraventricular tachycardia 10/20/2019   History of skin cancer (basal cell) 06/16/2019   Complete right bundle branch block 06/16/2019   B12 deficiency 06/15/2019   History of TIA (transient ischemic attack) 10/23/2017   Chronic kidney disease, stage 3b (HCC) 07/21/2015   Medication management 07/04/2014   Essential hypertension 11/25/2013   Hyperlipidemia, mixed 11/25/2013   Abnormal glucose 11/25/2013   Vitamin D  deficiency 11/25/2013   Essential tremor 11/25/2013   History of endometrial cancer    Hx/o  DVT/PE    Urethral prolapse    Osteoporosis 10/10/2011    ONSET DATE: 04/28/2024  REFERRING DIAG: S/P L intertrachanteric femur fracture, Left distal radius fracture 04/28/2024   THERAPY DIAG:  Other abnormalities of gait and mobility  Unsteadiness on feet  Muscle weakness (generalized)  Rationale for Evaluation and Treatment: Rehabilitation  SUBJECTIVE:                                                                                                                                                                                             SUBJECTIVE STATEMENT: Had the stitches removed and bandage taken off L hand.  Still a little tight and a little sore.  Pt accompanied by: self  PERTINENT HISTORY:  osteoporosis, hx of DVT; see additional PMH above  PAIN:  Are you having pain? No   PRECAUTIONS: Fall, wears L compression stocking due to L LE edema (was negative for DVT)  **CTR surgery LUE 10/12/24:  wearing bandage for 5 days, no lifting, to return to MD 11/18 for removal of stitches, follow up  RED FLAGS: None   WEIGHT BEARING RESTRICTIONS: No  FALLS: Has patient fallen in last 6 months? Yes. Number of falls 1  LIVING ENVIRONMENT: Lives with: lives alone Lives in: House/apartment Stairs: 3 steps to enter home;  Has following equipment at home: Single point cane and Walker - 2 wheeled *cane has tripod base and has gotten quad tip (per 10/28 visit) PLOF: Independent  PATIENT GOALS: To be able to transition to the cane, so I can get in and out of home better.  OBJECTIVE:    TODAY'S TREATMENT: 10/27/2024 Activity Comments  Lars 51/56 Improved from 44/56  DGI 16/24 Improved from 11/24  Gait velocity: 56M cane: 15.59 sec (2.1 ft/sec) 10 M no cane: 17.94 sec (1.82 ft/sec)   TUG with cane: 14.29 sec TUG no cane:  17.13 sec TUG manual no cane:  19.87 sec      FTSTS:  16.16 sec   Seated LLE strength: LAQ 2 x 10 reps 4# Cues for neutral hip position and back position to lessen hip pain    M-CTSIB  Condition 1: Firm Surface, EO 30 Sec, Normal Sway  Condition 2: Firm Surface, EC 30 Sec, Mild Sway  Condition 3: Foam Surface, EO 30 Sec, Mild Sway  Condition 4: Foam Surface, EC 30 Sec, Moderate Sway         PATIENT EDUCATION: Education details:Progress towards goals, POC, HEP continuation-neutral position for L hip to allow for improved strengthening/less pain Person educated: Patient Education method: Explanation, Demonstration, and Verbal cues Education comprehension: verbalized understanding and returned  demonstration    Access Code: [email or web address] URL: [email or web address] Date: 09/09/2024 Prepared by: [facility] - Outpatient  Rehab - [facility]  Program Notes (per pt report 10/20-using 3# at home) Marching in place, side hip kicks, back hip kicks:  Do 3 sets of 10 with 2# weight  Exercises - Backward Walking with Counter Support  - 1 x daily - 5 x weekly - 1 sets - 5 reps - Alternating Step Taps with Counter Support  - 1 x daily - 5 x weekly - 3 sets - 10 reps - Side Stepping with Counter Support  - 1 x daily - 5 x weekly - 1 sets - 5 reps - Corner Balance Feet Together With Eyes Closed  - 1 x daily - 7 x weekly - 3 reps - 30 sec hold - Corner Balance Feet Together: Eyes Open With Head Turns  - 1 x daily - 7 x weekly - 3 reps - 30 sec hold - Corner Balance Feet Together: Eyes Closed With Head Turns  - 1 x daily - 7 x weekly - 3 reps - 30 sec hold - Semi-Tandem Corner Balance With Eyes Open  - 1 x daily - 7 x weekly - 3 reps - 15-30 sec hold   -------------------------------------------- Note: Objective measures were completed at Evaluation unless otherwise noted.  DIAGNOSTIC FINDINGS: Interval proximal left femoral cephalomedullary nail fixation of the previously seen intertrochanteric fracture. Improved alignment.  COGNITION: Overall cognitive status: Within functional limits for tasks assessed   SENSATION: Reports decreased sensation in L fingers (to see hand surgeon)  EDEMA:  Noted LLE; wearing compression stocking  POSTURE: rounded shoulders and increased thoracic kyphosis  LOWER EXTREMITY ROM:     Active  Right Eval Left Eval  Hip flexion    Hip extension    Hip abduction    Hip adduction    Hip internal rotation    Hip external rotation    Knee flexion    Knee extension    Ankle dorsiflexion  10  Ankle plantarflexion  40  Ankle inversion    Ankle eversion     (Blank rows = not tested)  LOWER EXTREMITY MMT:    MMT Right Eval Left Eval  Hip flexion 4 3+  Hip extension    Hip abduction 5 4  Hip adduction 5 4  Hip internal rotation    Hip external rotation    Knee flexion 4+ 4+  Knee  extension 4+ 4  Ankle dorsiflexion 4+ 4  Ankle plantarflexion    Ankle inversion    Ankle eversion    (Blank rows = not tested)  TRANSFERS: Sit to stand: Modified independence  Assistive device utilized: None     Stand to sit: Modified independence  Assistive device utilized: hands at chair      STAIRS: Findings: Level of Assistance: Modified independence, Stair Negotiation Technique: Step to Pattern with Single Rail on Right, Number of Stairs: 2-3, Height of Stairs: 4-6   , and Comments: prior to fracture, pt negotiated steps alternating pattern GAIT: Findings: Gait Characteristics: step through pattern, decreased step length- Right, and decreased stance time- Left, Distance walked: 50 ft x 2, Assistive device utilized:Walker - 2 wheeled and SPC with small quad tip base, Level of assistance: Modified independence, CGA, and Min A, and Comments: Mod I with RW, CGA/min assist with cane  FUNCTIONAL TESTS:  10 meter walk test: 16.78 sec RW (1.95 ft/sec); 33.62 sec with cane (0.98 ft/sec) Berg:  34/56 (Scores <  45/56 indicate increased fall risk)   [facility] PT Assessment - 10/27/24 1107       Berg Balance Test   Sit to Stand Able to stand without using hands and stabilize independently    Standing Unsupported Able to stand safely 2 minutes    Sitting with Back Unsupported but Feet Supported on Floor or Stool Able to sit safely and securely 2 minutes    Stand to Sit Sits safely with minimal use of hands    Transfers Able to transfer safely, minor use of hands    Standing Unsupported with Eyes Closed Able to stand 10 seconds safely    Standing Unsupported with Feet Together Able to place feet together independently and stand 1 minute safely    From Standing, Reach Forward with Outstretched Arm Can reach confidently >25 cm (10)    From Standing Position, Pick up Object from Floor Able to pick up shoe safely and easily    From Standing Position, Turn to Look Behind Over each Shoulder Looks  behind one side only/other side shows less weight shift    Turn 360 Degrees Able to turn 360 degrees safely in 4 seconds or less    Standing Unsupported, Alternately Place Feet on Step/Stool Able to complete 4 steps without aid or supervision    Standing Unsupported, One Foot in Front Able to place foot tandem independently and hold 30 seconds    Standing on One Leg Able to lift leg independently and hold equal to or more than 3 seconds    Total Score 51    Berg comment: --   Improved from 44/56     Dynamic Gait Index   Level Surface Mild Impairment   9.31   Change in Gait Speed Mild Impairment    Gait with Horizontal Head Turns Mild Impairment   10.8   Gait with Vertical Head Turns Mild Impairment   9 sec   Gait and Pivot Turn Mild Impairment    Step Over Obstacle Mild Impairment    Step Around Obstacles Mild Impairment    Steps Mild Impairment    Total Score 16    DGI comment: Improved from 11/24                                                                                                                                      TREATMENT DATE: 08/30/2024    PATIENT EDUCATION: Education details: Eval results, POC, continue HEP from HHPT; verbally added lateral weightshifting to LLE, with attention to light UE support Person educated: Patient Education method: Explanation, Demonstration, and Verbal cues Education comprehension: verbalized understanding, returned demonstration, and needs further education  HOME EXERCISE PROGRAM: Verbally initiated lateral weightshifting; pt doing standing hip kicks, heel/toe raises, marching and sit to stand at home  GOALS: Goals reviewed with patient? Yes  SHORT TERM GOALS: Target date: 10/01/2024  Pt will be independent with HEP for improved  gait, stair negotiation, balance. Baseline: no concerns on HEP  09/29/24 Goal status: IN PROGRESS 09/29/24  2.  Pt will improve Berg score to at least 45/56 to decrease fall risk. Baseline:  34/56>44/56 09/27/2024 Goal status: PARTIALLY MET 09/27/2024, MET 10/27/2024  3.  Pt will negotiate curb and 3 steps with cane, mod I, for improved household and community gait. Baseline: stairs with cane with CGA  09/29/24 Goal status: MET, 10/27/2024   LONG TERM GOALS: Target date: 10/29/2024>12/10/2024  Pt will be independent with HEP for improved balance, strength, gait, stairs. Baseline: continuing to update Goal status: IN PROGRESS, 10/27/2024  2.  Pt will improve DGI score to at least 20/24 to decrease fall risk. Baseline: 11/24>16/24 Goal status:IN PROGRESS, 11/19/205  3.  Pt will improve gait velocity to at least 2.6 ft/sec with cane for improved gait efficiency and safety. Baseline: 0.98 ft/sec>2.1 ft/sec with cane 10/27/2024 Goal status: IN PROGRESS, 10/27/2024  4.  Pt will ambulate at least 500 ft, indoor and outdoor gait, mod I with cane for improved community mobility. Baseline: community distances with daughter, per report Goal status: MET, 10/27/2024  5.  Pt will improve TUG score to less than or equal to 13.5 sec without device, for decreased fall risk.  Baseline:  TUG no cane 17.13 sec 10/27/24  Goal status:  INITIAL  6.  Pt will improve TUG manual score to less than or equal to 15 sec for decreased fall risk.  Baseline:  19.87 sec 10/27/24  Goal status:  INITIAL  7.  Pt will improve gait velocity *with no cane* to at least 2.3 ft/sec for improved gait efficiency and safety.  Baseline:  1.82 ft/sec 10/27/24  Goal status:  INITIAL  8.  Pt will improve 5x sit<>stand to less than or equal to 14.5 sec to demonstrate improved functional strength and transfer efficiency.  Baseline:  16.16 sec 10/27/24  Goal status:  INITIAL    ASSESSMENT:  CLINICAL IMPRESSION: Pt presents today reporting getting stitches out and bandage off, following CTR surgery.  Skilled PT session focused on assessing LTGs today.  DGI score improved from 11/24>16/24, improving, just not to  goal level and still indicating increased fall risk.  Berg Balance score improved from 44/56 to 51/56, indicating decreased fall risk.  Gait velocity with cane improved to 2.1 ft/sec and without cane 1.82 ft/sec (indicates fall risk).  TUG and TUG manual score and FTSTS scores without device indicate increased fall risk.  She has met LTG 4 for long distance gait, but LTG 1-3 remain in progress.  Pt has made nice progress since evaluation and has progressed to community gait with cane.  However, given fall risk with no device, she will continue to benefit from skilled PT to further address strength, balance, and gait for improved functional mobility, independence, and decreased fall risk.  OBJECTIVE IMPAIRMENTS: Abnormal gait, decreased balance, decreased mobility, difficulty walking, and decreased strength.   ACTIVITY LIMITATIONS: standing, stairs, transfers, and locomotion level  PARTICIPATION LIMITATIONS: meal prep, cleaning, driving, shopping, community activity, yard work, and church  PERSONAL FACTORS: 3+ comorbidities: see PMH above are also affecting patient's functional outcome.   REHAB POTENTIAL: Good  CLINICAL DECISION MAKING: Evolving/moderate complexity  EVALUATION COMPLEXITY: Moderate  PLAN:  PT FREQUENCY: 2x/week  PT DURATION: 6 weeks per recert 10/27/2024  PLANNED INTERVENTIONS: 97750- Physical Performance Testing, 97110-Therapeutic exercises, 97530- Therapeutic activity, 97112- Neuromuscular re-education, 97535- Self Care, [id number]- Manual therapy, [phone]- Gait training, Patient/Family education, Balance training, Stair training, and DME instructions  PLAN FOR NEXT SESSION:  Review HEP and make sure to include LLE strengthening-consider hip abduction/glut strengthening supine.  Continue weight progression/resistance with standing exercises at counter.  Gait training and balance training in clinic no device as appropriate.    Greig Anon, PT 10/27/24 11:46 AM Phone:  [phone] Fax: [phone]  [facility] at [facility] [address] [place], [place] [address] Phone # [phone] Fax # [phone]    Referring diagnosis:  S/P L intertrachanteric femur fracture, Left distal radius fracture 04/28/2024  Treatment diagnosis (if different than referring diagnosis): R26.89, R26.81, M62.81 Date Symptoms Began: 04/28/2024 # of Visits requested: 12  Time period for Authorization: 11/01/2024 to 12/10/2024  What was this (referring dx) caused by? []  Surgery [x]  Fall []  Ongoing issue []  Arthritis []  Other: ____________  Laterality: []  Rt [x]  Lt []  Both  Functional Tool & Score: DGI 16/24, 10 M (gait velocity with no cane):  1.82 ft/sec, TUG no cane 17.13 sec, TUG manual no cane 19.87 sec  Check all possible CPT codes:     See Planned Interventions listed in the Plan section of the Evaluation.     If [employer]: Choose 10 or less codes  If [employer]: Modalities are not covered  If [employer]: Check allowed ICD code combinations   If [facility] Plan or [employer]: Cognitive training not covered

## 2024-10-27 NOTE — Patient Instructions (Signed)
   Instructed in exercises 1-4

## 2024-10-27 NOTE — Therapy (Signed)
 OUTPATIENT OCCUPATIONAL THERAPY ORTHO EVALUATION  Patient Name: Monica Curry MRN: [id number] DOB:24-Mar-1931, 89 y.o., female Today's Date: 10/27/2024  PCP: Wendolyn Jenkins Jansky, MD  REFERRING PROVIDER: Arlinda Buster, MD  END OF SESSION:  OT End of Session - 10/27/24 1506     Visit Number 1    Number of Visits 11    Date for Recertification  12/08/24    Authorization Type [employer]    OT Start Time 1148    OT Stop Time 1234    OT Time Calculation (min) 46 min          Past Medical History:  Diagnosis Date   Allergy    Medications and pollen   Anemia    Anxiety    Always   C. difficile colitis    Cancer (HCC)    Endometrial cancer   Cataract    Had surgery 2021   Chronic kidney disease 2018   2 siblings had kidney failure, sister had transplant   Clotting disorder    Essential and other specified forms of tremor    Hyperlipidemia    Hypertension    Osteoporosis 11/2019   T score -2.8 stable from prior DEXA   Personal history of venous thrombosis and embolism 1997   Pulmonary embolism (HCC) 06/14/2021   Pulmonary embolus (HCC)    Shingles    Stroke (HCC) 2018   TIA   Thrush    TIA (transient ischemic attack)    carotid US  07/2022: no ICA stenosis bilaterally   Past Surgical History:  Procedure Laterality Date   ABDOMINAL HYSTERECTOMY  1997   APPENDECTOMY  1961   cataract surg Bilateral 2021   Dr. Roz, R eye 8/18, L 8/25   CESAREAN SECTION  1961   EYE SURGERY  2021   Cataract both eyes   FRACTURE SURGERY  2025   Broken hip   INTRAMEDULLARY (IM) NAIL INTERTROCHANTERIC Left 04/28/2024   Procedure: FIXATION, FRACTURE, INTERTROCHANTERIC, WITH INTRAMEDULLARY ROD;  Surgeon: Kendal Franky SQUIBB, MD;  Location: [facility] OR;  Service: Orthopedics;  Laterality: Left;   TONSILLECTOMY AND ADENOIDECTOMY  1944   TOTAL ABDOMINAL HYSTERECTOMY W/ BILATERAL SALPINGOOPHORECTOMY  1997   Patient Active Problem List   Diagnosis Date Noted   RLS (restless legs syndrome)  06/15/2024   Fracture, [place], left, closed 04/28/2024   Hip fracture (HCC) 04/27/2024   GAD (generalized anxiety disorder) 04/01/2024   Thyroid  nodule 01/10/2022   Anxiety 08/10/2021   Aortic atherosclerosis (HCC) - CT 06/2021 06/15/2021   Bilateral renal cysts 07/06/2020   Paroxysmal supraventricular tachycardia 10/20/2019   History of skin cancer (basal cell) 06/16/2019   Complete right bundle branch block 06/16/2019   B12 deficiency 06/15/2019   History of TIA (transient ischemic attack) 10/23/2017   Chronic kidney disease, stage 3b (HCC) 07/21/2015   Medication management 07/04/2014   Essential hypertension 11/25/2013   Hyperlipidemia, mixed 11/25/2013   Abnormal glucose 11/25/2013   Vitamin D  deficiency 11/25/2013   Essential tremor 11/25/2013   History of endometrial cancer    Hx/o DVT/PE    Urethral prolapse    Osteoporosis 10/10/2011    ONSET DATE: referral date 10/07/24 (injury 04/27/24 and [facility] sx 10/12/24)  REFERRING DIAG: G56.02 (ICD-10-CM) - Carpal tunnel syndrome, left upper limb  THERAPY DIAG:  Stiffness of left wrist, not elsewhere classified  Other disturbances of skin sensation  Other lack of coordination  Muscle weakness (generalized)  Rationale for Evaluation and Treatment: Rehabilitation  SUBJECTIVE:   SUBJECTIVE STATEMENT: Pt reports  that the hand and palm are still very tight.  Pt with one small area at the distal end of the incision that appears partially open but is clean and skin intact.  Pt reports that she contacted the Dr and they told her to keep it clean and not apply and creams or meds to incision.  Pt reports that her splint was removed yesterday and that her stitches were removed yesterday.  Due to splinting she has not been able to completing any of her previous wrist exercises.  Pt reports that from a PT standpoint she has transitioned from a walker to a cane.    Pt accompanied by: self  PERTINENT HISTORY:   Per MD note 10/26/24: 89  y.o. female who presents today for follow up 2 weeks status post left wrist open carpal tunnel release.  Doing well overall, pain controlled, does have persistent numbness and tingling.  Left hand: - Well-healing palmar incision, sutures removed, skin edges well-approximated without erythema or drainage - Composite fist limited secondary to stiffness, improved passively - Sensation remains diminished to light touch in the median nerve distribution - 4/5 APB mild thenar atrophy, thumb opposition to ring finger PIP  PRECAUTIONS: Fall  WEIGHT BEARING RESTRICTIONS: No  PAIN:  Are you having pain? Yes: NPRS scale: minimal Pain location: palm of hand Pain description: stretch Aggravating factors: touching hand around incision Relieving factors: being still  FALLS: Has patient fallen in last 6 months? Yes. Number of falls 1 - fall in May that resulted in hip and wrist fractures.  LIVING ENVIRONMENT: Lives with: lives alone Lives in: House/apartment Stairs: 3 steps to enter, flight of steps to the basement but she does not go down them Has following equipment at home: [name] - 2 wheeled, single point cane, shower chair, and Grab bars  PLOF: Independent with basic ADLs and Requires assistive device for independence; step-daughter is currently doing laundry at her home as pt's washer/dryer are in the basement  PATIENT GOALS: to be able to use Left hand again   NEXT MD VISIT: 11/23/24  OBJECTIVE:  Note: Objective measures were completed at Evaluation unless otherwise noted.  HAND DOMINANCE: Right  ADLs: WFL; difficulty with donning compression sock on L leg but is able to don  FUNCTIONAL OUTCOME MEASURES: Quick Dash: 50%   UPPER EXTREMITY ROM:     Active ROM Right eval Left eval  Shoulder flexion    Shoulder abduction    Shoulder adduction    Shoulder extension    Shoulder internal rotation    Shoulder external rotation    Elbow flexion    Elbow extension    Wrist  flexion 75 34  Wrist extension 60 17  Wrist ulnar deviation 30 18  Wrist radial deviation 12 10  Wrist pronation  WFL  Wrist supination  80%  (Blank rows = not tested)  Loose grasp on L, however all fingers are able to touch palm of hand lightly in open fist, decreased full fist, hook fist, and table top  HAND FUNCTION: Not assessed due to stitches just removed yesterday.  On eval prior to surgery 08/30/24: Right: 40 lbs; Left: 5 lbs  COORDINATION: 9 Hole Peg test: Right: 30.13 sec; Left: 61.75 sec; Mild B tremors  SENSATION: Numbness and tingling in R hand and digits along median nerve distribution  EDEMA: NA  COGNITION: Overall cognitive status: Within functional limits for tasks assessed   OBSERVATIONS: Pt L wrist at ulnar head and dorsal wrist enlarged compared to  R wrist and hand due to bone deformity at dorsal wrist and ulnar styloid s/p setting of fracture back in May 2025. Pt demonstrating ~50% movement in all directions when compared to R, but verbalizing good understanding of exercises.    TREATMENT DATE:  10/27/24 Issued HEP with focus on tendon glides, thumb opposition, active ROM wrist flexion/extension and ulnar/radial deviation. Pt familiar with majority of exercises, however benefiting from cues and demonstration for table top position during tendon glides and wrist flexion/extension and supination to allow for increased stability during ROM. Discussed modifications to routine to allow pt to safely return to managing laundry, as her washer/dryer are in the basement.  Plan to further assess and problem solve in conjunction with PT for stair navigation.                                           PATIENT EDUCATION: Education details: Educated on role and purpose of OT as well as potential interventions and goals for therapy based on initial evaluation findings. Person educated: Patient Education method: Explanation, Demonstration, Verbal cues, and Handouts Education  comprehension: verbalized understanding and needs further education  HOME EXERCISE PROGRAM: Carpal tunnel release HEP (see pt instructions)  GOALS: Goals reviewed with patient? Yes  SHORT TERM GOALS: Target date: 11/19/24  Pt will be independent with HEP for ROM and strengthening. Baseline: new to OPOT Goal status: INITIAL   2.  Pt will verbalize understanding of task modifications and/or potential A/E needs to increase ease, safety, and independence w/ ADLs. Baseline: difficulty with donning compression socks d/t decreased grip and mobility of L wrist/hand Goal status: INITIAL   3.  Pt will demonstrate improvements in wrist flexion/extension and functional pinch to complete 9 hole peg test with LUE in <55 seconds. Baseline: 61 sec Goal status: INITIAL  4.  Pt will verbalize understanding of re-sensitization strategies as well as compensatory strategies due to impaired sensation.  Baseline: impaired sensation in hand at median nerve distribution  Goal status: INITIAL    LONG TERM GOALS: Target date: 12/08/24  Pt will demonstrate improved coordination of LUE as evidenced by completing 9 hole peg test in 45 seconds or less as needed for [facility] tasks with ADLs/IADLS. Baseline: 61 Goal status: INITIAL  2.  Pt will improve grip strength in left hand to be within at least 20 pounds of her right hand for functional use at home and in IADLs.  Baseline: L: 5# and R: 40# Goal status: INITIAL   3.  Pt will demonstrate improved L wrist flexion/extension to 75% of R wrist flexion/extension for improved functional use.  Baseline: R:75/60 and L: 34/17 Goal status: INITIAL   4.  Pt will demonstrate improved L forearm supination to 100% of R forearm supination for improved functional use. Baseline: 80% Goal status: INITIAL   5.  Pt will demonstrate improvements in pain and functional use of dominant RUE as evidenced by decreased score on QuickDASH from 50% impairement to < 30 % impairment.   Baseline: 50% Goal status: INITIAL  6.  Pt will verbalize understanding of safe modifications and/or adaptive techniques to complete laundry tasks at Mod I level. Baseline: step-daughter is currently completing due to laundry room being in basement Goal status: INITIAL  ASSESSMENT:  CLINICAL IMPRESSION: Patient is a 89 y.o. female who was seen today for occupational therapy evaluation s/p L carpal tunnel release. Pt is  reporting decreased pain, however stiffness and diminished sensation persisting.  Pt currently lives alone in a single story home with 3 steps to enter and a basement but is not going up/down into the basement.  Pt has family that will assist with laundry tasks as her washer and dryer are in the basement. Pt will benefit from skilled occupational therapy services to address strength and coordination, ROM, pain management, altered sensation, introduction of compensatory strategies/AE prn, and implementation of an HEP to improve participation and safety during ADLs and IADLs.    PERFORMANCE DEFICITS: in functional skills including ADLs, IADLs, coordination, sensation, edema, ROM, strength, flexibility, Fine motor control, Gross motor control, body mechanics, endurance, decreased knowledge of precautions, and UE functional use and psychosocial skills including routines and behaviors.   IMPAIRMENTS: are limiting patient from ADLs, IADLs, rest and sleep, and social participation.   COMORBIDITIES: may have co-morbidities  that affects occupational performance. Patient will benefit from skilled OT to address above impairments and improve overall function.  MODIFICATION OR ASSISTANCE TO COMPLETE EVALUATION: No modification of tasks or assist necessary to complete an evaluation.  OT OCCUPATIONAL PROFILE AND HISTORY: Detailed assessment: Review of records and additional review of physical, cognitive, psychosocial history related to current functional performance.  CLINICAL DECISION  MAKING: LOW - limited treatment options, no task modification necessary  REHAB POTENTIAL: Good  EVALUATION COMPLEXITY: Low      PLAN:  OT FREQUENCY: 1-2x/week  OT DURATION: 6 weeks  PLANNED INTERVENTIONS: 97168 OT Re-evaluation, 97535 self care/ADL training, 02889 therapeutic exercise, 97530 therapeutic activity, 97112 neuromuscular re-education, 97140 manual therapy, 97035 ultrasound, 97018 paraffin, 02960 fluidotherapy, 97760 Orthotic Initial, 97763 Orthotic/Prosthetic subsequent, passive range of motion, compression bandaging, coping strategies training, patient/family education, and DME and/or AE instructions   RECOMMENDED OTHER SERVICES: NA  CONSULTED AND AGREED WITH PLAN OF CARE: Patient  PLAN FOR NEXT SESSION: Review HEP, educate on scar massage, use of modalities for ROM and pain relief  Referring diagnosis:  G56.02 (ICD-10-CM) - Carpal tunnel syndrome, left upper limb Treatment diagnosis (if different than referring diagnosis):  Stiffness of left wrist, not elsewhere classified  Other disturbances of skin sensation  Other lack of coordination  Muscle weakness (generalized) Date Symptoms Began: surgery 10/12/24 # of Visits requested: 11  Time period for Authorization: 10/27/24 to 12/02/24  What was this (referring dx) caused by? [x]  Surgery []  Fall []  Ongoing issue []  Arthritis []  Other: ____________  Laterality: []  Rt [x]  Lt []  Both  Functional Tool & Score: QuickDASH 50% impairment  Check all possible CPT codes:     See Planned Interventions listed in the Plan section of the Evaluation.     If [employer]: Choose 10 or less codes  If [employer]: Modalities are not covered  If [employer]: Check allowed ICD code combinations   If [facility] Plan or [employer]: Cognitive training not covered    KAYLENE DOMINO, OTR/L 10/27/2024, 3:08 PM  [facility] at [facility] [address] [place], [place]  [address] Phone # [phone] Fax # [phone]

## 2024-11-01 ENCOUNTER — Encounter: Payer: Self-pay | Admitting: Physical Therapy

## 2024-11-01 ENCOUNTER — Ambulatory Visit: Admitting: Physical Therapy

## 2024-11-01 ENCOUNTER — Ambulatory Visit: Admitting: Occupational Therapy

## 2024-11-01 DIAGNOSIS — R2689 Other abnormalities of gait and mobility: Secondary | ICD-10-CM

## 2024-11-01 DIAGNOSIS — R208 Other disturbances of skin sensation: Secondary | ICD-10-CM | POA: Diagnosis not present

## 2024-11-01 DIAGNOSIS — R2681 Unsteadiness on feet: Secondary | ICD-10-CM | POA: Diagnosis not present

## 2024-11-01 DIAGNOSIS — M25632 Stiffness of left wrist, not elsewhere classified: Secondary | ICD-10-CM

## 2024-11-01 DIAGNOSIS — M6281 Muscle weakness (generalized): Secondary | ICD-10-CM

## 2024-11-01 DIAGNOSIS — R278 Other lack of coordination: Secondary | ICD-10-CM | POA: Diagnosis not present

## 2024-11-01 NOTE — Therapy (Signed)
 OUTPATIENT OCCUPATIONAL THERAPY ORTHO Treatment Note  Patient Name: Monica Curry MRN: [id number] DOB:18-Jan-1931, 89 y.o., female Today's Date: 11/02/2024  PCP: Wendolyn Jenkins Jansky, MD  REFERRING PROVIDER: Arlinda Buster, MD  END OF SESSION:  OT End of Session - 11/01/24 1315     Visit Number 2    Number of Visits 11    Date for Recertification  12/08/24    Authorization Type [employer]    Authorization Time Period Auth#: [id number] , approved 11 OT visits from 10/27/2024 - 01/25/2025    Authorization - Visit Number 1    Authorization - Number of Visits 11    OT Start Time 1404    OT Stop Time 1446    OT Time Calculation (min) 42 min           Past Medical History:  Diagnosis Date   Allergy    Medications and pollen   Anemia    Anxiety    Always   C. difficile colitis    Cancer (HCC)    Endometrial cancer   Cataract    Had surgery 2021   Chronic kidney disease 2018   2 siblings had kidney failure, sister had transplant   Clotting disorder    Essential and other specified forms of tremor    Hyperlipidemia    Hypertension    Osteoporosis 11/2019   T score -2.8 stable from prior DEXA   Personal history of venous thrombosis and embolism 1997   Pulmonary embolism (HCC) 06/14/2021   Pulmonary embolus (HCC)    Shingles    Stroke (HCC) 2018   TIA   Thrush    TIA (transient ischemic attack)    carotid US  07/2022: no ICA stenosis bilaterally   Past Surgical History:  Procedure Laterality Date   ABDOMINAL HYSTERECTOMY  1997   APPENDECTOMY  1961   cataract surg Bilateral 2021   Dr. Roz, R eye 8/18, L 8/25   CESAREAN SECTION  1961   EYE SURGERY  2021   Cataract both eyes   FRACTURE SURGERY  2025   Broken hip   INTRAMEDULLARY (IM) NAIL INTERTROCHANTERIC Left 04/28/2024   Procedure: FIXATION, FRACTURE, INTERTROCHANTERIC, WITH INTRAMEDULLARY ROD;  Surgeon: Kendal Franky SQUIBB, MD;  Location: [facility] OR;  Service: Orthopedics;  Laterality: Left;   TONSILLECTOMY  AND ADENOIDECTOMY  1944   TOTAL ABDOMINAL HYSTERECTOMY W/ BILATERAL SALPINGOOPHORECTOMY  1997   Patient Active Problem List   Diagnosis Date Noted   RLS (restless legs syndrome) 06/15/2024   Fracture, Colles, left, closed 04/28/2024   Hip fracture (HCC) 04/27/2024   GAD (generalized anxiety disorder) 04/01/2024   Thyroid  nodule 01/10/2022   Anxiety 08/10/2021   Aortic atherosclerosis (HCC) - CT 06/2021 06/15/2021   Bilateral renal cysts 07/06/2020   Paroxysmal supraventricular tachycardia 10/20/2019   History of skin cancer (basal cell) 06/16/2019   Complete right bundle branch block 06/16/2019   B12 deficiency 06/15/2019   History of TIA (transient ischemic attack) 10/23/2017   Chronic kidney disease, stage 3b (HCC) 07/21/2015   Medication management 07/04/2014   Essential hypertension 11/25/2013   Hyperlipidemia, mixed 11/25/2013   Abnormal glucose 11/25/2013   Vitamin D  deficiency 11/25/2013   Essential tremor 11/25/2013   History of endometrial cancer    Hx/o DVT/PE    Urethral prolapse    Osteoporosis 10/10/2011    ONSET DATE: referral date 10/07/24 (injury 04/27/24 and [facility] sx 10/12/24)  REFERRING DIAG: G56.02 (ICD-10-CM) - Carpal tunnel syndrome, left upper limb  THERAPY DIAG:  Stiffness  of left wrist, not elsewhere classified  Other lack of coordination  Other disturbances of skin sensation  Muscle weakness (generalized)  Rationale for Evaluation and Treatment: Rehabilitation  SUBJECTIVE:   SUBJECTIVE STATEMENT: Pt reports that her hand and fingers are very stiff.  Pt reporting that she attempted her exercises but that was having pain, so based on previous recommendations she stopped.    Pt accompanied by: self  PERTINENT HISTORY:   Per MD note 10/26/24: 89 y.o. female who presents today for follow up 2 weeks status post left wrist open carpal tunnel release.  Doing well overall, pain controlled, does have persistent numbness and tingling.  Left hand: -  Well-healing palmar incision, sutures removed, skin edges well-approximated without erythema or drainage - Composite fist limited secondary to stiffness, improved passively - Sensation remains diminished to light touch in the median nerve distribution - 4/5 APB mild thenar atrophy, thumb opposition to ring finger PIP  PRECAUTIONS: Fall  WEIGHT BEARING RESTRICTIONS: No  PAIN:  Are you having pain? Yes: NPRS scale: minimal Pain location: palm of hand Pain description: twinge Aggravating factors: touching hand around incision Relieving factors: being still  FALLS: Has patient fallen in last 6 months? Yes. Number of falls 1 - fall in May that resulted in hip and wrist fractures.  LIVING ENVIRONMENT: Lives with: lives alone Lives in: House/apartment Stairs: 3 steps to enter, flight of steps to the basement but she does not go down them Has following equipment at home: [name] - 2 wheeled, single point cane, shower chair, and Grab bars  PLOF: Independent with basic ADLs and Requires assistive device for independence; step-daughter is currently doing laundry at her home as pt's washer/dryer are in the basement  PATIENT GOALS: to be able to use Left hand again   NEXT MD VISIT: 11/23/24  OBJECTIVE:  Note: Objective measures were completed at Evaluation unless otherwise noted.  HAND DOMINANCE: Right  ADLs: WFL; difficulty with donning compression sock on L leg but is able to don  FUNCTIONAL OUTCOME MEASURES: Quick Dash: 50%   UPPER EXTREMITY ROM:     Active ROM Right eval Left eval  Shoulder flexion    Shoulder abduction    Shoulder adduction    Shoulder extension    Shoulder internal rotation    Shoulder external rotation    Elbow flexion    Elbow extension    Wrist flexion 75 34  Wrist extension 60 17  Wrist ulnar deviation 30 18  Wrist radial deviation 12 10  Wrist pronation  WFL  Wrist supination  80%  (Blank rows = not tested)  Loose grasp on L, however all  fingers are able to touch palm of hand lightly in open fist, decreased full fist, hook fist, and table top  HAND FUNCTION: Not assessed due to stitches just removed yesterday.  On eval prior to surgery 08/30/24: Right: 40 lbs; Left: 5 lbs  COORDINATION: 9 Hole Peg test: Right: 30.13 sec; Left: 61.75 sec; Mild B tremors  SENSATION: Numbness and tingling in R hand and digits along median nerve distribution  EDEMA: NA  COGNITION: Overall cognitive status: Within functional limits for tasks assessed   OBSERVATIONS: Pt L wrist at ulnar head and dorsal wrist enlarged compared to R wrist and hand due to bone deformity at dorsal wrist and ulnar styloid s/p setting of fracture back in May 2025. Pt demonstrating ~50% movement in all directions when compared to R, but verbalizing good understanding of exercises.    TREATMENT  DATE:  11/01/24 Scar mobilization/massage: OT providing massage in circular, side to side, and up/down direction around incision to  increase the range of motion around the scar.  OT progressed to light pressure over incision in circular and up/down direction.  OT educating on progressive pressure as healing continues and to avoid areas with scabbing. Manual desensitization: educating on tolerance to progressive consistencies of textures along incision starting with cotton ball or soft cotton and progressing to wash cloth to decrease hypersensitivity. Modalities: educated on use of heat over wrist and/or finger joints to increase ROM.  Reiterating not submerging incision in water but use of heat pack or running water over hands prior to HEP to decrease pain and facilitate increased ROM.    10/27/24 Issued HEP with focus on tendon glides, thumb opposition, active ROM wrist flexion/extension and ulnar/radial deviation. Pt familiar with majority of exercises, however benefiting from cues and demonstration for table top position during tendon glides and wrist flexion/extension and  supination to allow for increased stability during ROM. Discussed modifications to routine to allow pt to safely return to managing laundry, as her washer/dryer are in the basement.  Plan to further assess and problem solve in conjunction with PT for stair navigation.                                           PATIENT EDUCATION: Education details: modalities, massage, desensitization Person educated: Patient Education method: Explanation, Demonstration, Verbal cues, and Handouts Education comprehension: verbalized understanding and needs further education  HOME EXERCISE PROGRAM: Carpal tunnel release HEP (see pt instructions)  GOALS: Goals reviewed with patient? Yes  SHORT TERM GOALS: Target date: 11/19/24  Pt will be independent with HEP for ROM and strengthening. Baseline: new to OPOT Goal status: in progress   2.  Pt will verbalize understanding of task modifications and/or potential A/E needs to increase ease, safety, and independence w/ ADLs. Baseline: difficulty with donning compression socks d/t decreased grip and mobility of L wrist/hand Goal status: in progress   3.  Pt will demonstrate improvements in wrist flexion/extension and functional pinch to complete 9 hole peg test with LUE in <55 seconds. Baseline: 61 sec Goal status: in progress  4.  Pt will verbalize understanding of re-sensitization strategies as well as compensatory strategies due to impaired sensation.  Baseline: impaired sensation in hand at median nerve distribution  Goal status: in progress    LONG TERM GOALS: Target date: 12/08/24  Pt will demonstrate improved coordination of LUE as evidenced by completing 9 hole peg test in 45 seconds or less as needed for [facility] tasks with ADLs/IADLS. Baseline: 61 Goal status: in progress  2.  Pt will improve grip strength in left hand to be within at least 20 pounds of her right hand for functional use at home and in IADLs.  Baseline: L: 5# and R: 40# Goal  status: in progress   3.  Pt will demonstrate improved L wrist flexion/extension to 75% of R wrist flexion/extension for improved functional use.  Baseline: R:75/60 and L: 34/17 Goal status: in progress   4.  Pt will demonstrate improved L forearm supination to 100% of R forearm supination for improved functional use. Baseline: 80% Goal status: in progress   5.  Pt will demonstrate improvements in pain and functional use of LUE as evidenced by decreased score on QuickDASH from 50% impairement to <  30 % impairment.  Baseline: 50% Goal status: in progress  6.  Pt will verbalize understanding of safe modifications and/or adaptive techniques to complete laundry tasks at Mod I level. Baseline: step-daughter is currently completing due to laundry room being in basement Goal status: in progress  ASSESSMENT:  CLINICAL IMPRESSION: Patient is a 89 y.o. female who was seen today for occupational therapy treatment s/p L carpal tunnel release. Pt is reporting decreased pain, however stiffness and diminished sensation persisting.  Pt reporting continued healing of incision, however increased pain and stiffness with exercises at home.  Pt tolerating massage and education on desensitization strategies.  Pt reporting decreased pain and stiffness in hand after massage.  Pt receptive to education on use of modalities prior to HEP and asking good questions throughout.  Pt will continue to benefit from skilled OT services to address ROM, strengthening, and improved functional use of LUE.  PERFORMANCE DEFICITS: in functional skills including ADLs, IADLs, coordination, sensation, edema, ROM, strength, flexibility, Fine motor control, Gross motor control, body mechanics, endurance, decreased knowledge of precautions, and UE functional use and psychosocial skills including routines and behaviors.      PLAN:  OT FREQUENCY: 1-2x/week  OT DURATION: 6 weeks  PLANNED INTERVENTIONS: 97168 OT Re-evaluation, 97535  self care/ADL training, 02889 therapeutic exercise, 97530 therapeutic activity, 97112 neuromuscular re-education, 97140 manual therapy, 97035 ultrasound, 97018 paraffin, 02239 Orthotic Initial, 97763 Orthotic/Prosthetic subsequent, passive range of motion, compression bandaging, coping strategies training, patient/family education, and DME and/or AE instructions   RECOMMENDED OTHER SERVICES: NA  CONSULTED AND AGREED WITH PLAN OF CARE: Patient  PLAN FOR NEXT SESSION: Review HEP, review scar massage, use of modalities for ROM and pain relief, after 4-6 weeks s/p sx can start light strengthening    Ariannah Arenson, OTR/L 11/02/2024, 9:06 AM  [facility] [address], [address] [place], [place] [address] Phone # [phone] Fax # [phone]

## 2024-11-01 NOTE — Therapy (Signed)
 OUTPATIENT PHYSICAL THERAPY NEURO TREATMENT NOTE  Patient Name: Monica Curry MRN: [id number] DOB:04-Nov-1931, 89 y.o., female Today's Date: 11/01/2024   PCP: Wendolyn Jenkins Jansky, MD REFERRING PROVIDER: Danton Lauraine LABOR, PA-C      END OF SESSION:  PT End of Session - 11/01/24 1317     Visit Number 15    Number of Visits 26    Date for Recertification  12/10/24    Authorization Type [employer]-reauth submitted    Authorization - Visit Number 15    Authorization - Number of Visits 17    Progress Note Due on Visit 20    PT Start Time 1318    PT Stop Time 1358    PT Time Calculation (min) 40 min    Equipment Utilized During Treatment Gait belt    Activity Tolerance Patient tolerated treatment well    Behavior During Therapy WFL for tasks assessed/performed                     Past Medical History:  Diagnosis Date   Allergy    Medications and pollen   Anemia    Anxiety    Always   C. difficile colitis    Cancer (HCC)    Endometrial cancer   Cataract    Had surgery 2021   Chronic kidney disease 2018   2 siblings had kidney failure, sister had transplant   Clotting disorder    Essential and other specified forms of tremor    Hyperlipidemia    Hypertension    Osteoporosis 11/2019   T score -2.8 stable from prior DEXA   Personal history of venous thrombosis and embolism 1997   Pulmonary embolism (HCC) 06/14/2021   Pulmonary embolus (HCC)    Shingles    Stroke (HCC) 2018   TIA   Thrush    TIA (transient ischemic attack)    carotid US  07/2022: no ICA stenosis bilaterally   Past Surgical History:  Procedure Laterality Date   ABDOMINAL HYSTERECTOMY  1997   APPENDECTOMY  1961   cataract surg Bilateral 2021   Dr. Roz, R eye 8/18, L 8/25   CESAREAN SECTION  1961   EYE SURGERY  2021   Cataract both eyes   FRACTURE SURGERY  2025   Broken hip   INTRAMEDULLARY (IM) NAIL INTERTROCHANTERIC Left 04/28/2024   Procedure: FIXATION, FRACTURE,  INTERTROCHANTERIC, WITH INTRAMEDULLARY ROD;  Surgeon: Kendal Franky SQUIBB, MD;  Location: [facility] OR;  Service: Orthopedics;  Laterality: Left;   TONSILLECTOMY AND ADENOIDECTOMY  1944   TOTAL ABDOMINAL HYSTERECTOMY W/ BILATERAL SALPINGOOPHORECTOMY  1997   Patient Active Problem List   Diagnosis Date Noted   RLS (restless legs syndrome) 06/15/2024   Fracture, Col[place], left, closed 04/28/2024   Hip fracture (HCC) 04/27/2024   GAD (generalized anxiety disorder) 04/01/2024   Thyroid  nodule 01/10/2022   Anxiety 08/10/2021   Aortic atherosclerosis (HCC) - CT 06/2021 06/15/2021   Bilateral renal cysts 07/06/2020   Paroxysmal supraventricular tachycardia 10/20/2019   History of skin cancer (basal cell) 06/16/2019   Complete right bundle branch block 06/16/2019   B12 deficiency 06/15/2019   History of TIA (transient ischemic attack) 10/23/2017   Chronic kidney disease, stage 3b (HCC) 07/21/2015   Medication management 07/04/2014   Essential hypertension 11/25/2013   Hyperlipidemia, mixed 11/25/2013   Abnormal glucose 11/25/2013   Vitamin D  deficiency 11/25/2013   Essential tremor 11/25/2013   History of endometrial cancer    Hx/o DVT/PE    Urethral prolapse  Osteoporosis 10/10/2011    ONSET DATE: 04/28/2024  REFERRING DIAG: S/P L intertrachanteric femur fracture, Left distal radius fracture 04/28/2024   THERAPY DIAG:  Muscle weakness (generalized)  Other abnormalities of gait and mobility  Unsteadiness on feet  Rationale for Evaluation and Treatment: Rehabilitation  SUBJECTIVE:                                                                                                                                                                                             SUBJECTIVE STATEMENT: Doing okay.  Drove today.  Hand is stiff.   Pt accompanied by: self  PERTINENT HISTORY: osteoporosis, hx of DVT; see additional PMH above  PAIN:  Are you having pain? No   PRECAUTIONS: Fall, wears L  compression stocking due to L LE edema (was negative for DVT)  **CTR surgery LUE 10/12/24:  wearing bandage for 5 days, no lifting, to return to MD 11/18 for removal of stitches, follow up  RED FLAGS: None   WEIGHT BEARING RESTRICTIONS: No  FALLS: Has patient fallen in last 6 months? Yes. Number of falls 1  LIVING ENVIRONMENT: Lives with: lives alone Lives in: House/apartment Stairs: 3 steps to enter home;  Has following equipment at home: Single point cane and Walker - 2 wheeled *cane has tripod base and has gotten quad tip (per 10/28 visit) PLOF: Independent  PATIENT GOALS: To be able to transition to the cane, so I can get in and out of home better.  OBJECTIVE:    TODAY'S TREATMENT: 11/01/2024 Activity Comments  Bridging 3 x 10 reps Good form, slowed pace with several episodes of L hamstring cramping  Hooklying hip abduction 2 x 10 reps Green band  Sidelying hip abduction 10 reps Tends to roll posteriorly  Sit to stand with band around thighs x 5 reps Cues for upright standing-pt c/o tightness in hamstrings  Seated hamstring stretch, 3 x 30 sec Foot propped on block, cues for technique-pt has some c/o tightness along IT band-cues for neutral LLE position as she tends to internally rotate  Sit to stand x 5 reps, with cues to push knees back for best tall posture Good form   Leg length: ASIS to medial malleolus RLE:  88 cm LLE 87 cm  Greater trochanter to medial malleolus RLE 81 cm LLE 79 cm  HOME EXERCISE PROGRAM: Access Code: [email or web address] URL: [email or web address] Date: 11/01/2024 Prepared by: [facility] - Outpatient  Rehab - [facility]  Program Notes Marching in place, side hip kicks, back hip kicks:  Do 3 sets of 10 with 2# weight  Exercises - Backward Walking with Counter Support  -  1 x daily - 5 x weekly - 1 sets - 5 reps - Alternating Step Taps with Counter Support  - 1 x daily - 5 x weekly - 3 sets - 10 reps - Side Stepping with Counter  Support  - 1 x daily - 5 x weekly - 1 sets - 5 reps - Corner Balance Feet Together With Eyes Closed  - 1 x daily - 7 x weekly - 3 reps - 30 sec hold - Corner Balance Feet Together: Eyes Open With Head Turns  - 1 x daily - 7 x weekly - 3 reps - 30 sec hold - Corner Balance Feet Together: Eyes Closed With Head Turns  - 1 x daily - 7 x weekly - 3 reps - 30 sec hold - Semi-Tandem Corner Balance With Eyes Open  - 1 x daily - 7 x weekly - 3 reps - 15-30 sec hold - Supine Bridge  - 1 x daily - 3-4 x weekly - 3 sets - 10 reps - Hooklying Isometric Clamshell  - 1 x daily - 3-4 x weekly - 3 sets - 10 reps - Sit to Stand with Resistance Around Legs  - 1 x daily - 4 x weekly - 2 sets - 10 reps - Seated Hamstring Stretch  - 1 x daily - 7 x weekly - 1 sets - 3 reps - 30 sec hold        PATIENT EDUCATION: Education details:HEP updates; continued to reinforce neutral position for L hip to allow for improved strengthening/less pain to avoid L IT band tightness.  Discussed possibility of LLD and will continue to address with stretching, strengthening first (then may talk about insert)-pt can speak to MD in follow up visit next week Person educated: Patient Education method: Explanation, Demonstration, and Verbal cues Education comprehension: verbalized understanding and returned demonstration      -------------------------------------------- Note: Objective measures were completed at Evaluation unless otherwise noted.  DIAGNOSTIC FINDINGS: Interval proximal left femoral cephalomedullary nail fixation of the previously seen intertrochanteric fracture. Improved alignment.  COGNITION: Overall cognitive status: Within functional limits for tasks assessed   SENSATION: Reports decreased sensation in L fingers (to see hand surgeon)  EDEMA:  Noted LLE; wearing compression stocking  POSTURE: rounded shoulders and increased thoracic kyphosis  LOWER EXTREMITY ROM:     Active  Right Eval Left Eval   Hip flexion    Hip extension    Hip abduction    Hip adduction    Hip internal rotation    Hip external rotation    Knee flexion    Knee extension    Ankle dorsiflexion  10  Ankle plantarflexion  40  Ankle inversion    Ankle eversion     (Blank rows = not tested)  LOWER EXTREMITY MMT:    MMT Right Eval Left Eval  Hip flexion 4 3+  Hip extension    Hip abduction 5 4  Hip adduction 5 4  Hip internal rotation    Hip external rotation    Knee flexion 4+ 4+  Knee extension 4+ 4  Ankle dorsiflexion 4+ 4  Ankle plantarflexion    Ankle inversion    Ankle eversion    (Blank rows = not tested)  TRANSFERS: Sit to stand: Modified independence  Assistive device utilized: None     Stand to sit: Modified independence  Assistive device utilized: hands at chair      STAIRS: Findings: Level of Assistance: Modified independence, Stair Negotiation Technique: Step to  Pattern with Single Rail on Right, Number of Stairs: 2-3, Height of Stairs: 4-6   , and Comments: prior to fracture, pt negotiated steps alternating pattern GAIT: Findings: Gait Characteristics: step through pattern, decreased step length- Right, and decreased stance time- Left, Distance walked: 50 ft x 2, Assistive device utilized:Walker - 2 wheeled and SPC with small quad tip base, Level of assistance: Modified independence, CGA, and Min A, and Comments: Mod I with RW, CGA/min assist with cane  FUNCTIONAL TESTS:  10 meter walk test: 16.78 sec RW (1.95 ft/sec); 33.62 sec with cane (0.98 ft/sec) Berg:  34/56 (Scores <45/56 indicate increased fall risk)                                                                                                                                 TREATMENT DATE: 08/30/2024    PATIENT EDUCATION: Education details: Eval results, POC, continue HEP from HHPT; verbally added lateral weightshifting to LLE, with attention to light UE support Person educated: Patient Education method:  Explanation, Demonstration, and Verbal cues Education comprehension: verbalized understanding, returned demonstration, and needs further education  HOME EXERCISE PROGRAM: Verbally initiated lateral weightshifting; pt doing standing hip kicks, heel/toe raises, marching and sit to stand at home  GOALS: Goals reviewed with patient? Yes  SHORT TERM GOALS: Target date: 10/01/2024  Pt will be independent with HEP for improved gait, stair negotiation, balance. Baseline: no concerns on HEP  09/29/24 Goal status: IN PROGRESS 09/29/24  2.  Pt will improve Berg score to at least 45/56 to decrease fall risk. Baseline: 34/56>44/56 09/27/2024 Goal status: PARTIALLY MET 09/27/2024, MET 10/27/2024  3.  Pt will negotiate curb and 3 steps with cane, mod I, for improved household and community gait. Baseline: stairs with cane with CGA  09/29/24 Goal status: MET, 10/27/2024   LONG TERM GOALS: Target date: 10/29/2024>12/10/2024  Pt will be independent with HEP for improved balance, strength, gait, stairs. Baseline: continuing to update Goal status: IN PROGRESS, 10/27/2024  2.  Pt will improve DGI score to at least 20/24 to decrease fall risk. Baseline: 11/24>16/24 Goal status:IN PROGRESS, 11/19/205  3.  Pt will improve gait velocity to at least 2.6 ft/sec with cane for improved gait efficiency and safety. Baseline: 0.98 ft/sec>2.1 ft/sec with cane 10/27/2024 Goal status: IN PROGRESS, 10/27/2024  4.  Pt will ambulate at least 500 ft, indoor and outdoor gait, mod I with cane for improved community mobility. Baseline: community distances with daughter, per report Goal status: MET, 10/27/2024  5.  Pt will improve TUG score to less than or equal to 13.5 sec without device, for decreased fall risk.  Baseline:  TUG no cane 17.13 sec 10/27/24  Goal status:  INITIAL  6.  Pt will improve TUG manual score to less than or equal to 15 sec for decreased fall risk.  Baseline:  19.87 sec 10/27/24  Goal  status:  INITIAL  7.  Pt will improve gait  velocity *with no cane* to at least 2.3 ft/sec for improved gait efficiency and safety.  Baseline:  1.82 ft/sec 10/27/24  Goal status:  INITIAL  8.  Pt will improve 5x sit<>stand to less than or equal to 14.5 sec to demonstrate improved functional strength and transfer efficiency.  Baseline:  16.16 sec 10/27/24  Goal status:  INITIAL    ASSESSMENT:  CLINICAL IMPRESSION: Pt presents today with reports of occasional feeling of decreased strength in LLE, especially upon initiating gait and in small spaces.  Skilled PT session focused on addressing glut and hip abductor strength LLE in varied positions.  She reports some hamstring tightness (and needs cues to fully extend knees in standing) and some IT band tightness (needs cues for neutral LLE position to avoid internal rotation).  With work on stretching and strengthening in optimal LLE positioning today, she does not have as much reports of pain/discomfort.  She does question leg length issues and with initial attempts at measuring leg length, LLE is measuring slightly less than RLE;  will continue to address with stretching and strengthening and pt will discuss with MD at follow up next week.  She will continue to benefit from skilled PT towards goals for improved functional mobility, strength, and decreased fall risk.  OBJECTIVE IMPAIRMENTS: Abnormal gait, decreased balance, decreased mobility, difficulty walking, and decreased strength.   ACTIVITY LIMITATIONS: standing, stairs, transfers, and locomotion level  PARTICIPATION LIMITATIONS: meal prep, cleaning, driving, shopping, community activity, yard work, and church  PERSONAL FACTORS: 3+ comorbidities: see PMH above are also affecting patient's functional outcome.   REHAB POTENTIAL: Good  CLINICAL DECISION MAKING: Evolving/moderate complexity  EVALUATION COMPLEXITY: Moderate  PLAN:  PT FREQUENCY: 2x/week  PT DURATION: 6 weeks per recert  10/27/2024  PLANNED INTERVENTIONS: 97750- Physical Performance Testing, 97110-Therapeutic exercises, 97530- Therapeutic activity, 97112- Neuromuscular re-education, 97535- Self Care, [id number]- Manual therapy, [phone]- Gait training, Patient/Family education, Balance training, Stair training, and DME instructions  PLAN FOR NEXT SESSION:  Review HEP updates.  Continue weight progression/resistance with standing exercises at counter.  Gait training and balance training in clinic no device as appropriate.    Greig Anon, PT 11/01/24 1:18 PM Phone: [phone] Fax: [phone]  [facility] at [facility] [address] [place], [place] [address] Phone # [phone] Fax # [phone]    Referring diagnosis:  S/P L intertrachanteric femur fracture, Left distal radius fracture 04/28/2024  Treatment diagnosis (if different than referring diagnosis): R26.89, R26.81, M62.81 Date Symptoms Began: 04/28/2024 # of Visits requested: 12  Time period for Authorization: 11/01/2024 to 12/10/2024  What was this (referring dx) caused by? []  Surgery [x]  Fall []  Ongoing issue []  Arthritis []  Other: ____________  Laterality: []  Rt [x]  Lt []  Both  Functional Tool & Score: DGI 16/24, 10 M (gait velocity with no cane):  1.82 ft/sec, TUG no cane 17.13 sec, TUG manual no cane 19.87 sec  Check all possible CPT codes:     See Planned Interventions listed in the Plan section of the Evaluation.     If [employer]: Choose 10 or less codes  If [employer]: Modalities are not covered  If [employer]: Check allowed ICD code combinations   If [facility] Plan or [employer]: Cognitive training not covered

## 2024-11-01 NOTE — Patient Instructions (Signed)
   Edited to state Massage scare for 3 mins, 2-3 times a day Massage scar for ~6 weeks

## 2024-11-08 ENCOUNTER — Ambulatory Visit: Attending: Student

## 2024-11-08 ENCOUNTER — Ambulatory Visit: Admitting: Occupational Therapy

## 2024-11-08 DIAGNOSIS — M6281 Muscle weakness (generalized): Secondary | ICD-10-CM | POA: Diagnosis present

## 2024-11-08 DIAGNOSIS — R2689 Other abnormalities of gait and mobility: Secondary | ICD-10-CM | POA: Insufficient documentation

## 2024-11-08 DIAGNOSIS — M25632 Stiffness of left wrist, not elsewhere classified: Secondary | ICD-10-CM | POA: Insufficient documentation

## 2024-11-08 DIAGNOSIS — R278 Other lack of coordination: Secondary | ICD-10-CM | POA: Diagnosis present

## 2024-11-08 DIAGNOSIS — R208 Other disturbances of skin sensation: Secondary | ICD-10-CM | POA: Insufficient documentation

## 2024-11-08 DIAGNOSIS — R2681 Unsteadiness on feet: Secondary | ICD-10-CM | POA: Diagnosis present

## 2024-11-08 NOTE — Therapy (Signed)
 OUTPATIENT PHYSICAL THERAPY NEURO TREATMENT NOTE  Patient Name: Monica Curry MRN: [id number] DOB:04/30/1931, 89 y.o., female Today's Date: 11/08/2024   PCP: Wendolyn Jenkins Jansky, MD REFERRING PROVIDER: Danton Lauraine LABOR, PA-C      END OF SESSION:  PT End of Session - 11/08/24 1406     Visit Number 16    Number of Visits 26    Date for Recertification  12/10/24    Authorization Type [employer]-reauth submitted    Authorization Time Period 12 visits approved from 11/24 to 12/10/24    Authorization - Visit Number 1    Authorization - Number of Visits 12    Progress Note Due on Visit 20    PT Start Time 1405    PT Stop Time 1445    PT Time Calculation (min) 40 min    Equipment Utilized During Treatment Gait belt    Activity Tolerance Patient tolerated treatment well    Behavior During Therapy WFL for tasks assessed/performed                     Past Medical History:  Diagnosis Date   Allergy    Medications and pollen   Anemia    Anxiety    Always   C. difficile colitis    Cancer (HCC)    Endometrial cancer   Cataract    Had surgery 2021   Chronic kidney disease 2018   2 siblings had kidney failure, sister had transplant   Clotting disorder    Essential and other specified forms of tremor    Hyperlipidemia    Hypertension    Osteoporosis 11/2019   T score -2.8 stable from prior DEXA   Personal history of venous thrombosis and embolism 1997   Pulmonary embolism (HCC) 06/14/2021   Pulmonary embolus (HCC)    Shingles    Stroke (HCC) 2018   TIA   Thrush    TIA (transient ischemic attack)    carotid US  07/2022: no ICA stenosis bilaterally   Past Surgical History:  Procedure Laterality Date   ABDOMINAL HYSTERECTOMY  1997   APPENDECTOMY  1961   cataract surg Bilateral 2021   Dr. Roz, R eye 8/18, L 8/25   CESAREAN SECTION  1961   EYE SURGERY  2021   Cataract both eyes   FRACTURE SURGERY  2025   Broken hip   INTRAMEDULLARY (IM) NAIL  INTERTROCHANTERIC Left 04/28/2024   Procedure: FIXATION, FRACTURE, INTERTROCHANTERIC, WITH INTRAMEDULLARY ROD;  Surgeon: Kendal Franky SQUIBB, MD;  Location: [facility] OR;  Service: Orthopedics;  Laterality: Left;   TONSILLECTOMY AND ADENOIDECTOMY  1944   TOTAL ABDOMINAL HYSTERECTOMY W/ BILATERAL SALPINGOOPHORECTOMY  1997   Patient Active Problem List   Diagnosis Date Noted   RLS (restless legs syndrome) 06/15/2024   Fracture, Colles, left, closed 04/28/2024   Hip fracture (HCC) 04/27/2024   GAD (generalized anxiety disorder) 04/01/2024   Thyroid  nodule 01/10/2022   Anxiety 08/10/2021   Aortic atherosclerosis (HCC) - CT 06/2021 06/15/2021   Bilateral renal cysts 07/06/2020   Paroxysmal supraventricular tachycardia 10/20/2019   History of skin cancer (basal cell) 06/16/2019   Complete right bundle branch block 06/16/2019   B12 deficiency 06/15/2019   History of TIA (transient ischemic attack) 10/23/2017   Chronic kidney disease, stage 3b (HCC) 07/21/2015   Medication management 07/04/2014   Essential hypertension 11/25/2013   Hyperlipidemia, mixed 11/25/2013   Abnormal glucose 11/25/2013   Vitamin D  deficiency 11/25/2013   Essential tremor 11/25/2013   History  of endometrial cancer    Hx/o DVT/PE    Urethral prolapse    Osteoporosis 10/10/2011    ONSET DATE: 04/28/2024  REFERRING DIAG: S/P L intertrachanteric femur fracture, Left distal radius fracture 04/28/2024   THERAPY DIAG:  Muscle weakness (generalized)  Other abnormalities of gait and mobility  Unsteadiness on feet  Rationale for Evaluation and Treatment: Rehabilitation  SUBJECTIVE:                                                                                                                                                                                             SUBJECTIVE STATEMENT: Doing well, had a good Thanksgiving. Still some soreness in the lateral left hip with discomfort upon arising/initiating movement   Pt  accompanied by: self  PERTINENT HISTORY: osteoporosis, hx of DVT; see additional PMH above  PAIN:  Are you having pain? No   PRECAUTIONS: Fall, wears L compression stocking due to L LE edema (was negative for DVT)  **CTR surgery LUE 10/12/24:  wearing bandage for 5 days, no lifting, to return to MD 11/18 for removal of stitches, follow up  RED FLAGS: None   WEIGHT BEARING RESTRICTIONS: No  FALLS: Has patient fallen in last 6 months? Yes. Number of falls 1  LIVING ENVIRONMENT: Lives with: lives alone Lives in: House/apartment Stairs: 3 steps to enter home;  Has following equipment at home: Single point cane and Walker - 2 wheeled *cane has tripod base and has gotten quad tip (per 10/28 visit) PLOF: Independent  PATIENT GOALS: To be able to transition to the cane, so I can get in and out of home better.  OBJECTIVE:   TODAY'S TREATMENT: 11/08/24 Activity Comments  Single leg stance drills To improve single leg stability and hip abd engagement--mirror for feedback  Bridge variations 1x10 W/ ankle PF, DF, feet elevated  Sidelying hip abd 2x10 For LLE, cues for position and engagement               TODAY'S TREATMENT: 11/01/2024 Activity Comments  Bridging 3 x 10 reps Good form, slowed pace with several episodes of L hamstring cramping  Hooklying hip abduction 2 x 10 reps Green band  Sidelying hip abduction 10 reps Tends to roll posteriorly  Sit to stand with band around thighs x 5 reps Cues for upright standing-pt c/o tightness in hamstrings  Seated hamstring stretch, 3 x 30 sec Foot propped on block, cues for technique-pt has some c/o tightness along IT band-cues for neutral LLE position as she tends to internally rotate  Sit to stand x 5 reps, with cues to push knees back for best tall  posture Good form   Leg length: ASIS to medial malleolus RLE:  88 cm LLE 87 cm  Greater trochanter to medial malleolus RLE 81 cm LLE 79 cm  HOME EXERCISE PROGRAM: Access Code:  [email or web address] URL: [email or web address] Date: 11/01/2024 Prepared by: [facility] - Outpatient  Rehab - [facility]  Program Notes Marching in place, side hip kicks, back hip kicks:  Do 3 sets of 10 with 2# weight  Exercises - Backward Walking with Counter Support  - 1 x daily - 5 x weekly - 1 sets - 5 reps - Alternating Step Taps with Counter Support  - 1 x daily - 5 x weekly - 3 sets - 10 reps - Side Stepping with Counter Support  - 1 x daily - 5 x weekly - 1 sets - 5 reps - Corner Balance Feet Together With Eyes Closed  - 1 x daily - 7 x weekly - 3 reps - 30 sec hold - Corner Balance Feet Together: Eyes Open With Head Turns  - 1 x daily - 7 x weekly - 3 reps - 30 sec hold - Corner Balance Feet Together: Eyes Closed With Head Turns  - 1 x daily - 7 x weekly - 3 reps - 30 sec hold - Semi-Tandem Corner Balance With Eyes Open  - 1 x daily - 7 x weekly - 3 reps - 15-30 sec hold - Supine Bridge  - 1 x daily - 3-4 x weekly - 3 sets - 10 reps - Hooklying Isometric Clamshell  - 1 x daily - 3-4 x weekly - 3 sets - 10 reps - Sit to Stand with Resistance Around Legs  - 1 x daily - 4 x weekly - 2 sets - 10 reps - Seated Hamstring Stretch  - 1 x daily - 7 x weekly - 1 sets - 3 reps - 30 sec hold - Sidelying Hip Abduction  - 1 x daily - 2-3 x weekly - 3 sets - 10 reps       PATIENT EDUCATION: Education details:HEP updates; continued to reinforce neutral position for L hip to allow for improved strengthening/less pain to avoid L IT band tightness.  Discussed possibility of LLD and will continue to address with stretching, strengthening first (then may talk about insert)-pt can speak to MD in follow up visit next week Person educated: Patient Education method: Explanation, Demonstration, and Verbal cues Education comprehension: verbalized understanding and returned demonstration      -------------------------------------------- Note: Objective measures were completed at  Evaluation unless otherwise noted.  DIAGNOSTIC FINDINGS: Interval proximal left femoral cephalomedullary nail fixation of the previously seen intertrochanteric fracture. Improved alignment.  COGNITION: Overall cognitive status: Within functional limits for tasks assessed   SENSATION: Reports decreased sensation in L fingers (to see hand surgeon)  EDEMA:  Noted LLE; wearing compression stocking  POSTURE: rounded shoulders and increased thoracic kyphosis  LOWER EXTREMITY ROM:     Active  Right Eval Left Eval  Hip flexion    Hip extension    Hip abduction    Hip adduction    Hip internal rotation    Hip external rotation    Knee flexion    Knee extension    Ankle dorsiflexion  10  Ankle plantarflexion  40  Ankle inversion    Ankle eversion     (Blank rows = not tested)  LOWER EXTREMITY MMT:    MMT Right Eval Left Eval  Hip flexion 4 3+  Hip extension    Hip  abduction 5 4  Hip adduction 5 4  Hip internal rotation    Hip external rotation    Knee flexion 4+ 4+  Knee extension 4+ 4  Ankle dorsiflexion 4+ 4  Ankle plantarflexion    Ankle inversion    Ankle eversion    (Blank rows = not tested)  TRANSFERS: Sit to stand: Modified independence  Assistive device utilized: None     Stand to sit: Modified independence  Assistive device utilized: hands at chair      STAIRS: Findings: Level of Assistance: Modified independence, Stair Negotiation Technique: Step to Pattern with Single Rail on Right, Number of Stairs: 2-3, Height of Stairs: 4-6   , and Comments: prior to fracture, pt negotiated steps alternating pattern GAIT: Findings: Gait Characteristics: step through pattern, decreased step length- Right, and decreased stance time- Left, Distance walked: 50 ft x 2, Assistive device utilized:Walker - 2 wheeled and SPC with small quad tip base, Level of assistance: Modified independence, CGA, and Min A, and Comments: Mod I with RW, CGA/min assist with cane  FUNCTIONAL  TESTS:  10 meter walk test: 16.78 sec RW (1.95 ft/sec); 33.62 sec with cane (0.98 ft/sec) Berg:  34/56 (Scores <45/56 indicate increased fall risk)                                                                                                                                 TREATMENT DATE: 08/30/2024    PATIENT EDUCATION: Education details: Eval results, POC, continue HEP from HHPT; verbally added lateral weightshifting to LLE, with attention to light UE support Person educated: Patient Education method: Explanation, Demonstration, and Verbal cues Education comprehension: verbalized understanding, returned demonstration, and needs further education  HOME EXERCISE PROGRAM: Verbally initiated lateral weightshifting; pt doing standing hip kicks, heel/toe raises, marching and sit to stand at home  GOALS: Goals reviewed with patient? Yes  SHORT TERM GOALS: Target date: 10/01/2024  Pt will be independent with HEP for improved gait, stair negotiation, balance. Baseline: no concerns on HEP  09/29/24 Goal status: IN PROGRESS 09/29/24  2.  Pt will improve Berg score to at least 45/56 to decrease fall risk. Baseline: 34/56>44/56 09/27/2024 Goal status: PARTIALLY MET 09/27/2024, MET 10/27/2024  3.  Pt will negotiate curb and 3 steps with cane, mod I, for improved household and community gait. Baseline: stairs with cane with CGA  09/29/24 Goal status: MET, 10/27/2024   LONG TERM GOALS: Target date: 10/29/2024>12/10/2024  Pt will be independent with HEP for improved balance, strength, gait, stairs. Baseline: continuing to update Goal status: IN PROGRESS, 10/27/2024  2.  Pt will improve DGI score to at least 20/24 to decrease fall risk. Baseline: 11/24>16/24 Goal status:IN PROGRESS, 11/19/205  3.  Pt will improve gait velocity to at least 2.6 ft/sec with cane for improved gait efficiency and safety. Baseline: 0.98 ft/sec>2.1 ft/sec with cane 10/27/2024 Goal status: IN PROGRESS,  10/27/2024  4.  Pt will ambulate at  least 500 ft, indoor and outdoor gait, mod I with cane for improved community mobility. Baseline: community distances with daughter, per report Goal status: MET, 10/27/2024  5.  Pt will improve TUG score to less than or equal to 13.5 sec without device, for decreased fall risk.  Baseline:  TUG no cane 17.13 sec 10/27/24  Goal status:  INITIAL  6.  Pt will improve TUG manual score to less than or equal to 15 sec for decreased fall risk.  Baseline:  19.87 sec 10/27/24  Goal status:  INITIAL  7.  Pt will improve gait velocity *with no cane* to at least 2.3 ft/sec for improved gait efficiency and safety.  Baseline:  1.82 ft/sec 10/27/24  Goal status:  INITIAL  8.  Pt will improve 5x sit<>stand to less than or equal to 14.5 sec to demonstrate improved functional strength and transfer efficiency.  Baseline:  16.16 sec 10/27/24  Goal status:  INITIAL    ASSESSMENT:  CLINICAL IMPRESSION: Notes ongoing issue of stiffness/difficulty initiating first step w/ LLE after initially arising. We tried a few solutions to improve sit to stand transfers but without much benefit or effect. Instructed in single leg stance drills to improve left hip abduction recruitment in stance phase to enable ambulation w/out AD and progressing for stride length symmetry. Review of strengthening exercises to improve hip extension engagement to good effect with bridge movement with alterations such as with ankle DF and providing her own feedback via tactile cues. Instructed in sidelying hip abduction to improve strength/endurance for left hip control OBJECTIVE IMPAIRMENTS: Abnormal gait, decreased balance, decreased mobility, difficulty walking, and decreased strength.   ACTIVITY LIMITATIONS: standing, stairs, transfers, and locomotion level  PARTICIPATION LIMITATIONS: meal prep, cleaning, driving, shopping, community activity, yard work, and church  PERSONAL FACTORS: 3+  comorbidities: see PMH above are also affecting patient's functional outcome.   REHAB POTENTIAL: Good  CLINICAL DECISION MAKING: Evolving/moderate complexity  EVALUATION COMPLEXITY: Moderate  PLAN:  PT FREQUENCY: 2x/week  PT DURATION: 6 weeks per recert 10/27/2024  PLANNED INTERVENTIONS: 97750- Physical Performance Testing, 97110-Therapeutic exercises, 97530- Therapeutic activity, 97112- Neuromuscular re-education, 97535- Self Care, [id number]- Manual therapy, [phone]- Gait training, Patient/Family education, Balance training, Stair training, and DME instructions  PLAN FOR NEXT SESSION:  Review HEP updates.  Continue weight progression/resistance with standing exercises at counter.  Gait training and balance training in clinic no device as appropriate.    2:56 PM, 11/08/24 M. Kelly Debany Vantol, PT, DPT Physical Therapist- [place] Office Number: [phone]     Referring diagnosis:  S/P L intertrachanteric femur fracture, Left distal radius fracture 04/28/2024  Treatment diagnosis (if different than referring diagnosis): R26.89, R26.81, M62.81 Date Symptoms Began: 04/28/2024 # of Visits requested: 12  Time period for Authorization: 11/01/2024 to 12/10/2024  What was this (referring dx) caused by? []  Surgery [x]  Fall []  Ongoing issue []  Arthritis []  Other: ____________  Laterality: []  Rt [x]  Lt []  Both  Functional Tool & Score: DGI 16/24, 10 M (gait velocity with no cane):  1.82 ft/sec, TUG no cane 17.13 sec, TUG manual no cane 19.87 sec  Check all possible CPT codes:     See Planned Interventions listed in the Plan section of the Evaluation.     If [employer]: Choose 10 or less codes  If [employer]: Modalities are not covered  If [employer]: Check allowed ICD code combinations   If [facility] Plan or [employer]: Cognitive training not covered

## 2024-11-09 ENCOUNTER — Telehealth (HOSPITAL_COMMUNITY): Payer: Self-pay | Admitting: *Deleted

## 2024-11-09 DIAGNOSIS — S52532D Colles' fracture of left radius, subsequent encounter for closed fracture with routine healing: Secondary | ICD-10-CM | POA: Diagnosis not present

## 2024-11-09 DIAGNOSIS — S72142D Displaced intertrochanteric fracture of left femur, subsequent encounter for closed fracture with routine healing: Secondary | ICD-10-CM | POA: Diagnosis not present

## 2024-11-09 NOTE — Telephone Encounter (Signed)
 Detailed instructions for stress test left on vm.

## 2024-11-10 ENCOUNTER — Other Ambulatory Visit: Payer: Self-pay | Admitting: Physician Assistant

## 2024-11-10 ENCOUNTER — Ambulatory Visit: Admitting: Occupational Therapy

## 2024-11-10 ENCOUNTER — Ambulatory Visit: Admitting: Physical Therapy

## 2024-11-10 ENCOUNTER — Encounter: Payer: Self-pay | Admitting: Physical Therapy

## 2024-11-10 DIAGNOSIS — M25632 Stiffness of left wrist, not elsewhere classified: Secondary | ICD-10-CM

## 2024-11-10 DIAGNOSIS — M6281 Muscle weakness (generalized): Secondary | ICD-10-CM

## 2024-11-10 DIAGNOSIS — R2681 Unsteadiness on feet: Secondary | ICD-10-CM

## 2024-11-10 DIAGNOSIS — R208 Other disturbances of skin sensation: Secondary | ICD-10-CM

## 2024-11-10 DIAGNOSIS — R2689 Other abnormalities of gait and mobility: Secondary | ICD-10-CM

## 2024-11-10 DIAGNOSIS — R0789 Other chest pain: Secondary | ICD-10-CM

## 2024-11-10 DIAGNOSIS — R278 Other lack of coordination: Secondary | ICD-10-CM

## 2024-11-10 NOTE — Therapy (Signed)
 OUTPATIENT PHYSICAL THERAPY NEURO TREATMENT NOTE  Patient Name: Monica Curry MRN: [id number] DOB:Feb 26, 1931, 89 y.o., female Today's Date: 11/10/2024   PCP: Wendolyn Jenkins Jansky, MD REFERRING PROVIDER: Danton Lauraine LABOR, PA-C      END OF SESSION:  PT End of Session - 11/10/24 1103     Visit Number 17    Number of Visits 26    Date for Recertification  12/10/24    Authorization Type [employer]-reauth submitted    Authorization Time Period 12 visits approved from 11/24 to 12/10/24    Authorization - Visit Number 3   last visit should have been 2   Authorization - Number of Visits 12    Progress Note Due on Visit 20    PT Start Time 1104    PT Stop Time 1144    PT Time Calculation (min) 40 min    Equipment Utilized During Treatment Gait belt    Activity Tolerance Patient tolerated treatment well    Behavior During Therapy WFL for tasks assessed/performed                      Past Medical History:  Diagnosis Date   Allergy    Medications and pollen   Anemia    Anxiety    Always   C. difficile colitis    Cancer (HCC)    Endometrial cancer   Cataract    Had surgery 2021   Chronic kidney disease 2018   2 siblings had kidney failure, sister had transplant   Clotting disorder    Essential and other specified forms of tremor    Hyperlipidemia    Hypertension    Osteoporosis 11/2019   T score -2.8 stable from prior DEXA   Personal history of venous thrombosis and embolism 1997   Pulmonary embolism (HCC) 06/14/2021   Pulmonary embolus (HCC)    Shingles    Stroke (HCC) 2018   TIA   Thrush    TIA (transient ischemic attack)    carotid US  07/2022: no ICA stenosis bilaterally   Past Surgical History:  Procedure Laterality Date   ABDOMINAL HYSTERECTOMY  1997   APPENDECTOMY  1961   cataract surg Bilateral 2021   Dr. Roz, R eye 8/18, L 8/25   CESAREAN SECTION  1961   EYE SURGERY  2021   Cataract both eyes   FRACTURE SURGERY  2025   Broken hip    INTRAMEDULLARY (IM) NAIL INTERTROCHANTERIC Left 04/28/2024   Procedure: FIXATION, FRACTURE, INTERTROCHANTERIC, WITH INTRAMEDULLARY ROD;  Surgeon: Kendal Franky SQUIBB, MD;  Location: [facility] OR;  Service: Orthopedics;  Laterality: Left;   TONSILLECTOMY AND ADENOIDECTOMY  1944   TOTAL ABDOMINAL HYSTERECTOMY W/ BILATERAL SALPINGOOPHORECTOMY  1997   Patient Active Problem List   Diagnosis Date Noted   RLS (restless legs syndrome) 06/15/2024   Fracture, Col[place], left, closed 04/28/2024   Hip fracture (HCC) 04/27/2024   GAD (generalized anxiety disorder) 04/01/2024   Thyroid  nodule 01/10/2022   Anxiety 08/10/2021   Aortic atherosclerosis (HCC) - CT 06/2021 06/15/2021   Bilateral renal cysts 07/06/2020   Paroxysmal supraventricular tachycardia 10/20/2019   History of skin cancer (basal cell) 06/16/2019   Complete right bundle branch block 06/16/2019   B12 deficiency 06/15/2019   History of TIA (transient ischemic attack) 10/23/2017   Chronic kidney disease, stage 3b (HCC) 07/21/2015   Medication management 07/04/2014   Essential hypertension 11/25/2013   Hyperlipidemia, mixed 11/25/2013   Abnormal glucose 11/25/2013   Vitamin D  deficiency 11/25/2013  Essential tremor 11/25/2013   History of endometrial cancer    Hx/o DVT/PE    Urethral prolapse    Osteoporosis 10/10/2011    ONSET DATE: 04/28/2024  REFERRING DIAG: S/P L intertrachanteric femur fracture, Left distal radius fracture 04/28/2024   THERAPY DIAG:  Muscle weakness (generalized)  Other abnormalities of gait and mobility  Unsteadiness on feet  Rationale for Evaluation and Treatment: Rehabilitation  SUBJECTIVE:                                                                                                                                                                                             SUBJECTIVE STATEMENT: Saw Dr. Kendal yesterday-he wasn't concerned about the possibility of leg length difference.  No pain today.  Pt  accompanied by: self  PERTINENT HISTORY: osteoporosis, hx of DVT; see additional PMH above  PAIN:  Are you having pain? No   PRECAUTIONS: Fall, wears L compression stocking due to L LE edema (was negative for DVT)  **CTR surgery LUE 10/12/24:  wearing bandage for 5 days, no lifting, to return to MD 11/18 for removal of stitches, follow up  RED FLAGS: None   WEIGHT BEARING RESTRICTIONS: No  FALLS: Has patient fallen in last 6 months? Yes. Number of falls 1  LIVING ENVIRONMENT: Lives with: lives alone Lives in: House/apartment Stairs: 3 steps to enter home;  Has following equipment at home: Single point cane and Walker - 2 wheeled *cane has tripod base and has gotten quad tip (per 10/28 visit) PLOF: Independent  PATIENT GOALS: To be able to transition to the cane, so I can get in and out of home better.  OBJECTIVE:    TODAY'S TREATMENT: 11/10/2024 Activity Comments  Reviewed HEP additions for technique:  sidelying hip abduction Bridging varied foot positions   Sit to stand  2 x 5 reps Varied foot positions to initiate gait  Single limb stance drills: -SLS each leg 10 reps, 3-5 sec -SLS with step taps to 8 block -SLS with rolling ball, 3 reps each leg -forward/back stepping 2 x 10 -side step over obstacle x 10 Intermittent UE support Use of mirror for upright posture, activation of gluts  Sit to stand, short distance gait to sit x 4 reps, no device supervision  Gait with cane 100 ft x 2 reps with cues for upright posture/look to target and cues to relax L UE for arm swing/balance       HOME EXERCISE PROGRAM: Access Code: [email or web address] URL: [email or web address] Date: 11/01/2024 Prepared by: [facility] Outpatient  Rehab - [facility]  Program Notes Marching in place, side hip kicks, back  hip kicks:  Do 3 sets of 10 with 2# weight  Exercises - Backward Walking with Counter Support  - 1 x daily - 5 x weekly - 1 sets - 5 reps - Alternating Step Taps  with Counter Support  - 1 x daily - 5 x weekly - 3 sets - 10 reps - Side Stepping with Counter Support  - 1 x daily - 5 x weekly - 1 sets - 5 reps - Corner Balance Feet Together With Eyes Closed  - 1 x daily - 7 x weekly - 3 reps - 30 sec hold - Corner Balance Feet Together: Eyes Open With Head Turns  - 1 x daily - 7 x weekly - 3 reps - 30 sec hold - Corner Balance Feet Together: Eyes Closed With Head Turns  - 1 x daily - 7 x weekly - 3 reps - 30 sec hold - Semi-Tandem Corner Balance With Eyes Open  - 1 x daily - 7 x weekly - 3 reps - 15-30 sec hold - Supine Bridge  - 1 x daily - 3-4 x weekly - 3 sets - 10 reps - Hooklying Isometric Clamshell  - 1 x daily - 3-4 x weekly - 3 sets - 10 reps - Sit to Stand with Resistance Around Legs  - 1 x daily - 4 x weekly - 2 sets - 10 reps - Seated Hamstring Stretch  - 1 x daily - 7 x weekly - 1 sets - 3 reps - 30 sec hold - Sidelying Hip Abduction  - 1 x daily - 2-3 x weekly - 3 sets - 10 reps       PATIENT EDUCATION: Education details: Posture, use of visual cues for optimal balance, cues for glut activation in SLS Person educated: Patient Education method: Explanation, Demonstration, and Verbal cues Education comprehension: verbalized understanding and returned demonstration      -------------------------------------------- Note: Objective measures were completed at Evaluation unless otherwise noted.  DIAGNOSTIC FINDINGS: Interval proximal left femoral cephalomedullary nail fixation of the previously seen intertrochanteric fracture. Improved alignment.  COGNITION: Overall cognitive status: Within functional limits for tasks assessed   SENSATION: Reports decreased sensation in L fingers (to see hand surgeon)  EDEMA:  Noted LLE; wearing compression stocking  POSTURE: rounded shoulders and increased thoracic kyphosis  LOWER EXTREMITY ROM:     Active  Right Eval Left Eval  Hip flexion    Hip extension    Hip abduction    Hip  adduction    Hip internal rotation    Hip external rotation    Knee flexion    Knee extension    Ankle dorsiflexion  10  Ankle plantarflexion  40  Ankle inversion    Ankle eversion     (Blank rows = not tested)  LOWER EXTREMITY MMT:    MMT Right Eval Left Eval  Hip flexion 4 3+  Hip extension    Hip abduction 5 4  Hip adduction 5 4  Hip internal rotation    Hip external rotation    Knee flexion 4+ 4+  Knee extension 4+ 4  Ankle dorsiflexion 4+ 4  Ankle plantarflexion    Ankle inversion    Ankle eversion    (Blank rows = not tested)  TRANSFERS: Sit to stand: Modified independence  Assistive device utilized: None     Stand to sit: Modified independence  Assistive device utilized: hands at chair      STAIRS: Findings: Level of Assistance: Modified independence, Psychologist, Counselling  Technique: Step to Pattern with Single Rail on Right, Number of Stairs: 2-3, Height of Stairs: 4-6   , and Comments: prior to fracture, pt negotiated steps alternating pattern GAIT: Findings: Gait Characteristics: step through pattern, decreased step length- Right, and decreased stance time- Left, Distance walked: 50 ft x 2, Assistive device utilized:Walker - 2 wheeled and SPC with small quad tip base, Level of assistance: Modified independence, CGA, and Min A, and Comments: Mod I with RW, CGA/min assist with cane  FUNCTIONAL TESTS:  10 meter walk test: 16.78 sec RW (1.95 ft/sec); 33.62 sec with cane (0.98 ft/sec) Berg:  34/56 (Scores <45/56 indicate increased fall risk)                                                                                                                                 TREATMENT DATE: 08/30/2024    PATIENT EDUCATION: Education details: Eval results, POC, continue HEP from HHPT; verbally added lateral weightshifting to LLE, with attention to light UE support Person educated: Patient Education method: Explanation, Demonstration, and Verbal cues Education  comprehension: verbalized understanding, returned demonstration, and needs further education  HOME EXERCISE PROGRAM: Verbally initiated lateral weightshifting; pt doing standing hip kicks, heel/toe raises, marching and sit to stand at home  GOALS: Goals reviewed with patient? Yes  SHORT TERM GOALS: Target date: 10/01/2024  Pt will be independent with HEP for improved gait, stair negotiation, balance. Baseline: no concerns on HEP  09/29/24 Goal status: IN PROGRESS 09/29/24  2.  Pt will improve Berg score to at least 45/56 to decrease fall risk. Baseline: 34/56>44/56 09/27/2024 Goal status: PARTIALLY MET 09/27/2024, MET 10/27/2024  3.  Pt will negotiate curb and 3 steps with cane, mod I, for improved household and community gait. Baseline: stairs with cane with CGA  09/29/24 Goal status: MET, 10/27/2024   LONG TERM GOALS: Target date: 10/29/2024>12/10/2024  Pt will be independent with HEP for improved balance, strength, gait, stairs. Baseline: continuing to update Goal status: IN PROGRESS, 10/27/2024  2.  Pt will improve DGI score to at least 20/24 to decrease fall risk. Baseline: 11/24>16/24 Goal status:IN PROGRESS, 11/19/205  3.  Pt will improve gait velocity to at least 2.6 ft/sec with cane for improved gait efficiency and safety. Baseline: 0.98 ft/sec>2.1 ft/sec with cane 10/27/2024 Goal status: IN PROGRESS, 10/27/2024  4.  Pt will ambulate at least 500 ft, indoor and outdoor gait, mod I with cane for improved community mobility. Baseline: community distances with daughter, per report Goal status: MET, 10/27/2024  5.  Pt will improve TUG score to less than or equal to 13.5 sec without device, for decreased fall risk.  Baseline:  TUG no cane 17.13 sec 10/27/24  Goal status:  INITIAL  6.  Pt will improve TUG manual score to less than or equal to 15 sec for decreased fall risk.  Baseline:  19.87 sec 10/27/24  Goal status:  INITIAL  7.  Pt will  improve gait velocity *with  no cane* to at least 2.3 ft/sec for improved gait efficiency and safety.  Baseline:  1.82 ft/sec 10/27/24  Goal status:  INITIAL  8.  Pt will improve 5x sit<>stand to less than or equal to 14.5 sec to demonstrate improved functional strength and transfer efficiency.  Baseline:  16.16 sec 10/27/24  Goal status:  INITIAL    ASSESSMENT:  CLINICAL IMPRESSION: Pt presents today with no new complaints. Skilled PT session focused on hip strengthening, glut activation working on single limb stance.  Pt seems pleased as she has increased success with SLS today compared to last visit.  She does best with cues for optimal posture and use of visual cues at mirror for improved glut activation/engagement with improved stability; she does fatigue with multiple reps through session and she reports feeling weaker through L hip.   Pt will continue to benefit from skilled PT towards goals for improved functional mobility and decreased fall risk.  OBJECTIVE IMPAIRMENTS: Abnormal gait, decreased balance, decreased mobility, difficulty walking, and decreased strength.   ACTIVITY LIMITATIONS: standing, stairs, transfers, and locomotion level  PARTICIPATION LIMITATIONS: meal prep, cleaning, driving, shopping, community activity, yard work, and church  PERSONAL FACTORS: 3+ comorbidities: see PMH above are also affecting patient's functional outcome.   REHAB POTENTIAL: Good  CLINICAL DECISION MAKING: Evolving/moderate complexity  EVALUATION COMPLEXITY: Moderate  PLAN:  PT FREQUENCY: 2x/week  PT DURATION: 6 weeks per recert 10/27/2024  PLANNED INTERVENTIONS: 97750- Physical Performance Testing, 97110-Therapeutic exercises, 97530- Therapeutic activity, 97112- Neuromuscular re-education, 97535- Self Care, [id number]- Manual therapy, [phone]- Gait training, Patient/Family education, Balance training, Stair training, and DME instructions  PLAN FOR NEXT SESSION:  SLS, glut activation in standing.  Continue weight  progression/resistance with standing exercises at counter.  Gait training and balance training in clinic no device as appropriate.    Greig Anon, PT 11/10/24 11:04 AM Phone: [phone] Fax: [phone]  [facility] at [facility] [address] [place], [address] [place], [place] [address] Phone # [phone] Fax # [phone]

## 2024-11-10 NOTE — Therapy (Signed)
 OUTPATIENT OCCUPATIONAL THERAPY ORTHO Treatment Note  Patient Name: Monica Curry MRN: [id number] DOB:21-Jan-1931, 89 y.o., female Today's Date: 11/10/2024  PCP: Wendolyn Jenkins Jansky, MD  REFERRING PROVIDER: Arlinda Buster, MD  END OF SESSION:  OT End of Session - 11/10/24 1245     Visit Number 3    Number of Visits 11    Date for Recertification  12/08/24    Authorization Type [employer]    Authorization Time Period Auth#: [id number] , approved 11 OT visits from 10/27/2024 - 01/25/2025    Authorization - Number of Visits 11    OT Start Time 1150    OT Stop Time 1230    OT Time Calculation (min) 40 min            Past Medical History:  Diagnosis Date   Allergy    Medications and pollen   Anemia    Anxiety    Always   C. difficile colitis    Cancer (HCC)    Endometrial cancer   Cataract    Had surgery 2021   Chronic kidney disease 2018   2 siblings had kidney failure, sister had transplant   Clotting disorder    Essential and other specified forms of tremor    Hyperlipidemia    Hypertension    Osteoporosis 11/2019   T score -2.8 stable from prior DEXA   Personal history of venous thrombosis and embolism 1997   Pulmonary embolism (HCC) 06/14/2021   Pulmonary embolus (HCC)    Shingles    Stroke (HCC) 2018   TIA   Thrush    TIA (transient ischemic attack)    carotid US  07/2022: no ICA stenosis bilaterally   Past Surgical History:  Procedure Laterality Date   ABDOMINAL HYSTERECTOMY  1997   APPENDECTOMY  1961   cataract surg Bilateral 2021   Dr. Roz, R eye 8/18, L 8/25   CESAREAN SECTION  1961   EYE SURGERY  2021   Cataract both eyes   FRACTURE SURGERY  2025   Broken hip   INTRAMEDULLARY (IM) NAIL INTERTROCHANTERIC Left 04/28/2024   Procedure: FIXATION, FRACTURE, INTERTROCHANTERIC, WITH INTRAMEDULLARY ROD;  Surgeon: Kendal Franky SQUIBB, MD;  Location: [facility] OR;  Service: Orthopedics;  Laterality: Left;   TONSILLECTOMY AND ADENOIDECTOMY  1944   TOTAL  ABDOMINAL HYSTERECTOMY W/ BILATERAL SALPINGOOPHORECTOMY  1997   Patient Active Problem List   Diagnosis Date Noted   RLS (restless legs syndrome) 06/15/2024   Fracture, Col[place], left, closed 04/28/2024   Hip fracture (HCC) 04/27/2024   GAD (generalized anxiety disorder) 04/01/2024   Thyroid  nodule 01/10/2022   Anxiety 08/10/2021   Aortic atherosclerosis (HCC) - CT 06/2021 06/15/2021   Bilateral renal cysts 07/06/2020   Paroxysmal supraventricular tachycardia 10/20/2019   History of skin cancer (basal cell) 06/16/2019   Complete right bundle branch block 06/16/2019   B12 deficiency 06/15/2019   History of TIA (transient ischemic attack) 10/23/2017   Chronic kidney disease, stage 3b (HCC) 07/21/2015   Medication management 07/04/2014   Essential hypertension 11/25/2013   Hyperlipidemia, mixed 11/25/2013   Abnormal glucose 11/25/2013   Vitamin D  deficiency 11/25/2013   Essential tremor 11/25/2013   History of endometrial cancer    Hx/o DVT/PE    Urethral prolapse    Osteoporosis 10/10/2011    ONSET DATE: referral date 10/07/24 (injury 04/27/24 and [facility] sx 10/12/24)  REFERRING DIAG: G56.02 (ICD-10-CM) - Carpal tunnel syndrome, left upper limb  THERAPY DIAG:  Stiffness of left wrist, not elsewhere classified  Other lack of coordination  Other disturbances of skin sensation  Rationale for Evaluation and Treatment: Rehabilitation  SUBJECTIVE:   SUBJECTIVE STATEMENT: Pt reports that she is having a stress test tomorrow.  Pt report  Pt accompanied by: self  PERTINENT HISTORY:   Per MD note 10/26/24: 89 y.o. female who presents today for follow up 2 weeks status post left wrist open carpal tunnel release.  Doing well overall, pain controlled, does have persistent numbness and tingling.  Left hand: - Well-healing palmar incision, sutures removed, skin edges well-approximated without erythema or drainage - Composite fist limited secondary to stiffness, improved passively -  Sensation remains diminished to light touch in the median nerve distribution - 4/5 APB mild thenar atrophy, thumb opposition to ring finger PIP  PRECAUTIONS: Fall  WEIGHT BEARING RESTRICTIONS: No  PAIN:  Are you having pain? Yes: NPRS scale: minimal Pain location: palm of hand Pain description: twinge Aggravating factors: touching hand around incision Relieving factors: being still  FALLS: Has patient fallen in last 6 months? Yes. Number of falls 1 - fall in May that resulted in hip and wrist fractures.  LIVING ENVIRONMENT: Lives with: lives alone Lives in: House/apartment Stairs: 3 steps to enter, flight of steps to the basement but she does not go down them Has following equipment at home: [name] - 2 wheeled, single point cane, shower chair, and Grab bars  PLOF: Independent with basic ADLs and Requires assistive device for independence; step-daughter is currently doing laundry at her home as pt's washer/dryer are in the basement  PATIENT GOALS: to be able to use Left hand again   NEXT MD VISIT: 11/23/24  OBJECTIVE:  Note: Objective measures were completed at Evaluation unless otherwise noted.  HAND DOMINANCE: Right  ADLs: WFL; difficulty with donning compression sock on L leg but is able to don  FUNCTIONAL OUTCOME MEASURES: Quick Dash: 50%   UPPER EXTREMITY ROM:     Active ROM Right eval Left eval  Shoulder flexion    Shoulder abduction    Shoulder adduction    Shoulder extension    Shoulder internal rotation    Shoulder external rotation    Elbow flexion    Elbow extension    Wrist flexion 75 34  Wrist extension 60 17  Wrist ulnar deviation 30 18  Wrist radial deviation 12 10  Wrist pronation  WFL  Wrist supination  80%  (Blank rows = not tested)  Loose grasp on L, however all fingers are able to touch palm of hand lightly in open fist, decreased full fist, hook fist, and table top  HAND FUNCTION: Not assessed due to stitches just removed  yesterday.  On eval prior to surgery 08/30/24: Right: 40 lbs; Left: 5 lbs  COORDINATION: 9 Hole Peg test: Right: 30.13 sec; Left: 61.75 sec; Mild B tremors  SENSATION: Numbness and tingling in R hand and digits along median nerve distribution  EDEMA: NA  COGNITION: Overall cognitive status: Within functional limits for tasks assessed   OBSERVATIONS: Pt L wrist at ulnar head and dorsal wrist enlarged compared to R wrist and hand due to bone deformity at dorsal wrist and ulnar styloid s/p setting of fracture back in May 2025. Pt demonstrating ~50% movement in all directions when compared to R, but verbalizing good understanding of exercises.    TREATMENT DATE:  11/10/24 Of note: pt's left hand and forearm blueish-purple, however once positioned with elbow propped on table typical color resumed.  Pt reporting that she has been doing  a lot of walking with PT with arms down.  OT educating on elevating hand at rest for improved circulation.   ROM: engaged in active wrist flexion and extension as well as radial/ulnar deviation. OT then instructing pt in PROM for wrist flexion and extension with forearm resting on table top to allow gravity to aid in further ROM, followed by slight pressure over top to facilitate increased ROM.  Pt completing each direction x10.  Strengthening: utilizing light resistance stress ball, pt completing full fist, 3 jaw chuck, and duck mouth x10 each.  OT educating on improved positioning for improved ROM and strengthening.   Educated on presence of pain with ROM, however encouraging pt to stop if pain present > 2/10 increase or if it lingers > 30-60 mins post activity.     11/01/24 Scar mobilization/massage: OT providing massage in circular, side to side, and up/down direction around incision to  increase the range of motion around the scar.  OT progressed to light pressure over incision in circular and up/down direction.  OT educating on progressive pressure as  healing continues and to avoid areas with scabbing. Manual desensitization: educating on tolerance to progressive consistencies of textures along incision starting with cotton ball or soft cotton and progressing to wash cloth to decrease hypersensitivity. Modalities: educated on use of heat over wrist and/or finger joints to increase ROM.  Reiterating not submerging incision in water but use of heat pack or running water over hands prior to HEP to decrease pain and facilitate increased ROM.    10/27/24 Issued HEP with focus on tendon glides, thumb opposition, active ROM wrist flexion/extension and ulnar/radial deviation. Pt familiar with majority of exercises, however benefiting from cues and demonstration for table top position during tendon glides and wrist flexion/extension and supination to allow for increased stability during ROM. Discussed modifications to routine to allow pt to safely return to managing laundry, as her washer/dryer are in the basement.  Plan to further assess and problem solve in conjunction with PT for stair navigation.                                           PATIENT EDUCATION: Education details: massage, ROM, strengthening Person educated: Patient Education method: Explanation, Demonstration, Verbal cues, and Handouts Education comprehension: verbalized understanding and needs further education  HOME EXERCISE PROGRAM: Carpal tunnel release HEP (see pt instructions)  Access Code: [email or web address] URL: [email or web address] Date: 11/10/2024 Prepared by: [facility] - [facility]  Exercises - Putty Squeezes  - 2 x daily - 15 reps - 3-Point Pinch with Putty  - 2 x daily - 15 reps - Finger Lumbricals with Putty  - 2 x daily - 15 reps - Seated Wrist Extension Stretch  - 2 x daily - 5-10 reps - Seated Wrist Flexion Stretch  - 2 x daily - 5-10 reps  GOALS: Goals reviewed with patient? Yes  SHORT TERM GOALS: Target date:  11/19/24  Pt will be independent with HEP for ROM and strengthening. Baseline: new to OPOT Goal status: in progress   2.  Pt will verbalize understanding of task modifications and/or potential A/E needs to increase ease, safety, and independence w/ ADLs. Baseline: difficulty with donning compression socks d/t decreased grip and mobility of L wrist/hand Goal status: in progress   3.  Pt will demonstrate improvements in wrist flexion/extension and functional pinch  to complete 9 hole peg test with LUE in <55 seconds. Baseline: 61 sec Goal status: in progress  4.  Pt will verbalize understanding of re-sensitization strategies as well as compensatory strategies due to impaired sensation.  Baseline: impaired sensation in hand at median nerve distribution  Goal status: in progress    LONG TERM GOALS: Target date: 12/08/24  Pt will demonstrate improved coordination of LUE as evidenced by completing 9 hole peg test in 45 seconds or less as needed for [facility] tasks with ADLs/IADLS. Baseline: 61 Goal status: in progress  2.  Pt will improve grip strength in left hand to be within at least 20 pounds of her right hand for functional use at home and in IADLs.  Baseline: L: 5# and R: 40# Goal status: in progress   3.  Pt will demonstrate improved L wrist flexion/extension to 75% of R wrist flexion/extension for improved functional use.  Baseline: R:75/60 and L: 34/17 Goal status: in progress   4.  Pt will demonstrate improved L forearm supination to 100% of R forearm supination for improved functional use. Baseline: 80% Goal status: in progress   5.  Pt will demonstrate improvements in pain and functional use of LUE as evidenced by decreased score on QuickDASH from 50% impairement to < 30 % impairment.  Baseline: 50% Goal status: in progress  6.  Pt will verbalize understanding of safe modifications and/or adaptive techniques to complete laundry tasks at Mod I level. Baseline: step-daughter is  currently completing due to laundry room being in basement Goal status: in progress  ASSESSMENT:  CLINICAL IMPRESSION: Patient is a 89 y.o. female who was seen today for occupational therapy treatment s/p L carpal tunnel release. Pt is reporting decreased pain, however stiffness and diminished sensation persisting.  Pt reporting continued healing of incision, however continued decrease in sensation in index and middle finger.  Pt tolerating active and passive ROM this session as well as incorporation of min resistance stress ball with various pinches and grips. Pt will continue to benefit from skilled OT services to address ROM, strengthening, and improved functional use of LUE.  PERFORMANCE DEFICITS: in functional skills including ADLs, IADLs, coordination, sensation, edema, ROM, strength, flexibility, Fine motor control, Gross motor control, body mechanics, endurance, decreased knowledge of precautions, and UE functional use and psychosocial skills including routines and behaviors.      PLAN:  OT FREQUENCY: 1-2x/week  OT DURATION: 6 weeks  PLANNED INTERVENTIONS: 97168 OT Re-evaluation, 97535 self care/ADL training, 02889 therapeutic exercise, 97530 therapeutic activity, 97112 neuromuscular re-education, 97140 manual therapy, 97035 ultrasound, 97018 paraffin, 02239 Orthotic Initial, 97763 Orthotic/Prosthetic subsequent, passive range of motion, compression bandaging, coping strategies training, patient/family education, and DME and/or AE instructions   RECOMMENDED OTHER SERVICES: NA  CONSULTED AND AGREED WITH PLAN OF CARE: Patient  PLAN FOR NEXT SESSION: Review HEP, review scar massage, use of modalities for ROM and pain relief, after 4-6 weeks s/p sx can start light strengthening    Mireyah Chervenak, OTR/L 11/10/2024, 12:45 PM  [facility] at [facility] [address] [place], [place] [address] Phone # [phone] Fax # [phone]

## 2024-11-11 ENCOUNTER — Ambulatory Visit (HOSPITAL_COMMUNITY)
Admission: RE | Admit: 2024-11-11 | Discharge: 2024-11-11 | Disposition: A | Source: Ambulatory Visit | Attending: Physician Assistant | Admitting: Physician Assistant

## 2024-11-11 DIAGNOSIS — R0789 Other chest pain: Secondary | ICD-10-CM | POA: Diagnosis not present

## 2024-11-11 MED ORDER — REGADENOSON 0.4 MG/5ML IV SOLN
0.4000 mg | Freq: Once | INTRAVENOUS | Status: AC
  Administered 2024-11-11: 0.4 mg via INTRAVENOUS

## 2024-11-11 MED ORDER — REGADENOSON 0.4 MG/5ML IV SOLN
INTRAVENOUS | Status: AC
  Filled 2024-11-11: qty 5

## 2024-11-11 MED ORDER — TECHNETIUM TC 99M TETROFOSMIN IV KIT
32.8000 | PACK | Freq: Once | INTRAVENOUS | Status: AC | PRN
  Administered 2024-11-11: 32.8 via INTRAVENOUS

## 2024-11-11 MED ORDER — TECHNETIUM TC 99M TETROFOSMIN IV KIT
10.7000 | PACK | Freq: Once | INTRAVENOUS | Status: AC | PRN
  Administered 2024-11-11: 10.7 via INTRAVENOUS

## 2024-11-12 LAB — MYOCARDIAL PERFUSION IMAGING
Base ST Depression (mm): 0 mm
LV dias vol: 70 mL (ref 46–106)
LV sys vol: 19 mL (ref 3.8–5.2)
Nuc Stress EF: 73 %
Peak HR: 94 {beats}/min
Rest HR: 57 {beats}/min
Rest Nuclear Isotope Dose: 10.7 mCi
SDS: 0
SRS: 0
SSS: 0
ST Depression (mm): 0 mm
Stress Nuclear Isotope Dose: 32.8 mCi
TID: 1.03

## 2024-11-15 ENCOUNTER — Ambulatory Visit: Payer: Self-pay | Admitting: Physician Assistant

## 2024-11-15 ENCOUNTER — Ambulatory Visit: Admitting: Physical Therapy

## 2024-11-15 ENCOUNTER — Encounter: Payer: Self-pay | Admitting: Family Medicine

## 2024-11-15 ENCOUNTER — Ambulatory Visit: Admitting: Occupational Therapy

## 2024-11-15 NOTE — Therapy (Incomplete)
 OUTPATIENT PHYSICAL THERAPY NEURO TREATMENT NOTE  Patient Name: Monica Curry MRN: [id number] DOB:[birth year], 89 y.o., female Today's Date: 11/15/2024   PCP: Wendolyn Jenkins Jansky, MD REFERRING PROVIDER: Danton Lauraine LABOR, PA-C      END OF SESSION:                Past Medical History:  Diagnosis Date   Allergy    Medications and pollen   Anemia    Anxiety    Always   C. difficile colitis    Cancer [facility])    Endometrial cancer   Cataract    Had surgery 2021   Chronic kidney disease 2018   2 siblings had kidney failure, sister had transplant   Clotting disorder    Essential and other specified forms of tremor    Hyperlipidemia    Hypertension    Osteoporosis 11/2019   T score -2.8 stable from prior DEXA   Personal history of venous thrombosis and embolism 1997   Pulmonary embolism (HCC) 06/14/2021   Pulmonary embolus (HCC)    Shingles    Stroke (HCC) 2018   TIA   Thrush    TIA (transient ischemic attack)    carotid US  07/2022: no ICA stenosis bilaterally   Past Surgical History:  Procedure Laterality Date   ABDOMINAL HYSTERECTOMY  1997   APPENDECTOMY  1961   cataract surg Bilateral 2021   Dr. Roz, R eye 8/18, L 8/25   CESAREAN SECTION  1961   EYE SURGERY  2021   Cataract both eyes   FRACTURE SURGERY  2025   Broken hip   INTRAMEDULLARY (IM) NAIL INTERTROCHANTERIC Left 04/28/2024   Procedure: FIXATION, FRACTURE, INTERTROCHANTERIC, WITH INTRAMEDULLARY ROD;  Surgeon: Kendal Franky SQUIBB, MD;  Location: [facility] OR;  Service: Orthopedics;  Laterality: Left;   TONSILLECTOMY AND ADENOIDECTOMY  1944   TOTAL ABDOMINAL HYSTERECTOMY W/ BILATERAL SALPINGOOPHORECTOMY  1997   Patient Active Problem List   Diagnosis Date Noted   RLS (restless legs syndrome) 06/15/2024   Fracture, Colles, left, closed 04/28/2024   Hip fracture (HCC) 04/27/2024   GAD (generalized anxiety disorder) 04/01/2024   Thyroid  nodule 01/10/2022   Anxiety 08/10/2021   Aortic atherosclerosis  (HCC) - CT 06/2021 06/15/2021   Bilateral renal cysts 07/06/2020   Paroxysmal supraventricular tachycardia 10/20/2019   History of skin cancer (basal cell) 06/16/2019   Complete right bundle branch block 06/16/2019   B12 deficiency 06/15/2019   History of TIA (transient ischemic attack) 10/23/2017   Chronic kidney disease, stage 3b (HCC) 07/21/2015   Medication management 07/04/2014   Essential hypertension 11/25/2013   Hyperlipidemia, mixed 11/25/2013   Abnormal glucose 11/25/2013   Vitamin D  deficiency 11/25/2013   Essential tremor 11/25/2013   History of endometrial cancer    Hx/o DVT/PE    Urethral prolapse    Osteoporosis 10/10/2011    ONSET DATE: 04/28/2024  REFERRING DIAG: S/P L intertrachanteric femur fracture, Left distal radius fracture 04/28/2024   THERAPY DIAG:  No diagnosis found.  Rationale for Evaluation and Treatment: Rehabilitation  SUBJECTIVE:  SUBJECTIVE STATEMENT: ***Saw Dr. Kendal yesterday-he wasn't concerned about the possibility of leg length difference.  No pain today.  Pt accompanied by: self  PERTINENT HISTORY: osteoporosis, hx of DVT; see additional PMH above  PAIN:  Are you having pain? No   PRECAUTIONS: Fall, wears L compression stocking due to L LE edema (was negative for DVT)  **CTR surgery LUE 10/12/24:  wearing bandage for 5 days, no lifting, to return to MD 11/18 for removal of stitches, follow up  RED FLAGS: None   WEIGHT BEARING RESTRICTIONS: No  FALLS: Has patient fallen in last 6 months? Yes. Number of falls 1  LIVING ENVIRONMENT: Lives with: lives alone Lives in: House/apartment Stairs: 3 steps to enter home;  Has following equipment at home: Single point cane and Walker - 2 wheeled *cane has tripod base and has gotten quad tip (per 10/28  visit) PLOF: Independent  PATIENT GOALS: To be able to transition to the cane, so I can get in and out of home better.  OBJECTIVE:    TODAY'S TREATMENT: 11/15/2024 Activity Comments                       TODAY'S TREATMENT: 11/10/2024 Activity Comments  Reviewed HEP additions for technique:  sidelying hip abduction Bridging varied foot positions   Sit to stand  2 x 5 reps Varied foot positions to initiate gait  Single limb stance drills: -SLS each leg 10 reps, 3-5 sec -SLS with step taps to 8 block -SLS with rolling ball, 3 reps each leg -forward/back stepping 2 x 10 -side step over obstacle x 10 Intermittent UE support Use of mirror for upright posture, activation of gluts  Sit to stand, short distance gait to sit x 4 reps, no device supervision  Gait with cane 100 ft x 2 reps with cues for upright posture/look to target and cues to relax L UE for arm swing/balance       HOME EXERCISE PROGRAM: Access Code: [email or web address] URL: [email or web address] Date: 11/01/2024 Prepared by: [facility] - [facility]  Program Notes Marching in place, side hip kicks, back hip kicks:  Do 3 sets of 10 with 2# weight  Exercises - Backward Walking with Counter Support  - 1 x daily - 5 x weekly - 1 sets - 5 reps - Alternating Step Taps with Counter Support  - 1 x daily - 5 x weekly - 3 sets - 10 reps - Side Stepping with Counter Support  - 1 x daily - 5 x weekly - 1 sets - 5 reps - Corner Balance Feet Together With Eyes Closed  - 1 x daily - 7 x weekly - 3 reps - 30 sec hold - Corner Balance Feet Together: Eyes Open With Head Turns  - 1 x daily - 7 x weekly - 3 reps - 30 sec hold - Corner Balance Feet Together: Eyes Closed With Head Turns  - 1 x daily - 7 x weekly - 3 reps - 30 sec hold - Semi-Tandem Corner Balance With Eyes Open  - 1 x daily - 7 x weekly - 3 reps - 15-30 sec hold - Supine Bridge  - 1 x daily - 3-4 x weekly - 3 sets - 10 reps - Hooklying  Isometric Clamshell  - 1 x daily - 3-4 x weekly - 3 sets - 10 reps - Sit to Stand with Resistance Around Legs  - 1 x daily - 4 x weekly -  2 sets - 10 reps - Seated Hamstring Stretch  - 1 x daily - 7 x weekly - 1 sets - 3 reps - 30 sec hold - Sidelying Hip Abduction  - 1 x daily - 2-3 x weekly - 3 sets - 10 reps       PATIENT EDUCATION: Education details: Posture, use of visual cues for optimal balance, cues for glut activation in SLS Person educated: Patient Education method: Explanation, Demonstration, and Verbal cues Education comprehension: verbalized understanding and returned demonstration      -------------------------------------------- Note: Objective measures were completed at Evaluation unless otherwise noted.  DIAGNOSTIC FINDINGS: Interval proximal left femoral cephalomedullary nail fixation of the previously seen intertrochanteric fracture. Improved alignment.  COGNITION: Overall cognitive status: Within functional limits for tasks assessed   SENSATION: Reports decreased sensation in L fingers (to see hand surgeon)  EDEMA:  Noted LLE; wearing compression stocking  POSTURE: rounded shoulders and increased thoracic kyphosis  LOWER EXTREMITY ROM:     Active  Right Eval Left Eval  Hip flexion    Hip extension    Hip abduction    Hip adduction    Hip internal rotation    Hip external rotation    Knee flexion    Knee extension    Ankle dorsiflexion  10  Ankle plantarflexion  40  Ankle inversion    Ankle eversion     (Blank rows = not tested)  LOWER EXTREMITY MMT:    MMT Right Eval Left Eval  Hip flexion 4 3+  Hip extension    Hip abduction 5 4  Hip adduction 5 4  Hip internal rotation    Hip external rotation    Knee flexion 4+ 4+  Knee extension 4+ 4  Ankle dorsiflexion 4+ 4  Ankle plantarflexion    Ankle inversion    Ankle eversion    (Blank rows = not tested)  TRANSFERS: Sit to stand: Modified independence  Assistive device  utilized: None     Stand to sit: Modified independence  Assistive device utilized: hands at chair      STAIRS: Findings: Level of Assistance: Modified independence, Stair Negotiation Technique: Step to Pattern with Single Rail on Right, Number of Stairs: 2-3, Height of Stairs: 4-6   , and Comments: prior to fracture, pt negotiated steps alternating pattern GAIT: Findings: Gait Characteristics: step through pattern, decreased step length- Right, and decreased stance time- Left, Distance walked: 50 ft x 2, Assistive device utilized:Walker - 2 wheeled and SPC with small quad tip base, Level of assistance: Modified independence, CGA, and Min A, and Comments: Mod I with RW, CGA/min assist with cane  FUNCTIONAL TESTS:  10 meter walk test: 16.78 sec RW (1.95 ft/sec); 33.62 sec with cane (0.98 ft/sec) Berg:  34/56 (Scores <45/56 indicate increased fall risk)  TREATMENT DATE: 08/30/2024    PATIENT EDUCATION: Education details: Eval results, POC, continue HEP from HHPT; verbally added lateral weightshifting to LLE, with attention to light UE support Person educated: Patient Education method: Explanation, Demonstration, and Verbal cues Education comprehension: verbalized understanding, returned demonstration, and needs further education  HOME EXERCISE PROGRAM: Verbally initiated lateral weightshifting; pt doing standing hip kicks, heel/toe raises, marching and sit to stand at home  GOALS: Goals reviewed with patient? Yes  SHORT TERM GOALS: Target date: 10/01/2024  Pt will be independent with HEP for improved gait, stair negotiation, balance. Baseline: no concerns on HEP  09/29/24 Goal status: IN PROGRESS 09/29/24  2.  Pt will improve Berg score to at least 45/56 to decrease fall risk. Baseline: 34/56>44/56 09/27/2024 Goal status: PARTIALLY MET 09/27/2024, MET  10/27/2024  3.  Pt will negotiate curb and 3 steps with cane, mod I, for improved household and community gait. Baseline: stairs with cane with CGA  09/29/24 Goal status: MET, 10/27/2024   LONG TERM GOALS: Target date: 10/29/2024>12/10/2024  Pt will be independent with HEP for improved balance, strength, gait, stairs. Baseline: continuing to update Goal status: IN PROGRESS, 10/27/2024  2.  Pt will improve DGI score to at least 20/24 to decrease fall risk. Baseline: 11/24>16/24 Goal status:IN PROGRESS, 11/19/205  3.  Pt will improve gait velocity to at least 2.6 ft/sec with cane for improved gait efficiency and safety. Baseline: 0.98 ft/sec>2.1 ft/sec with cane 10/27/2024 Goal status: IN PROGRESS, 10/27/2024  4.  Pt will ambulate at least 500 ft, indoor and outdoor gait, mod I with cane for improved community mobility. Baseline: community distances with daughter, per report Goal status: MET, 10/27/2024  5.  Pt will improve TUG score to less than or equal to 13.5 sec without device, for decreased fall risk.  Baseline:  TUG no cane 17.13 sec 10/27/24  Goal status:  INITIAL  6.  Pt will improve TUG manual score to less than or equal to 15 sec for decreased fall risk.  Baseline:  19.87 sec 10/27/24  Goal status:  INITIAL  7.  Pt will improve gait velocity *with no cane* to at least 2.3 ft/sec for improved gait efficiency and safety.  Baseline:  1.82 ft/sec 10/27/24  Goal status:  INITIAL  8.  Pt will improve 5x sit<>stand to less than or equal to 14.5 sec to demonstrate improved functional strength and transfer efficiency.  Baseline:  16.16 sec 10/27/24  Goal status:  INITIAL    ASSESSMENT:  CLINICAL IMPRESSION: Pt presents today ***. Skilled PT session focused on ***. Pt needs ***. Pt will continue to benefit from skilled PT towards goals for improved functional mobility and decreased fall risk.  Pt presents today with no new complaints. Skilled PT session focused on hip  strengthening, glut activation working on single limb stance.  Pt seems pleased as she has increased success with SLS today compared to last visit.  She does best with cues for optimal posture and use of visual cues at mirror for improved glut activation/engagement with improved stability; she does fatigue with multiple reps through session and she reports feeling weaker through L hip.   Pt will continue to benefit from skilled PT towards goals for improved functional mobility and decreased fall risk.  OBJECTIVE IMPAIRMENTS: Abnormal gait, decreased balance, decreased mobility, difficulty walking, and decreased strength.   ACTIVITY LIMITATIONS: standing, stairs, transfers, and locomotion level  PARTICIPATION LIMITATIONS: meal prep, cleaning, driving, shopping, community activity, yard work, and church  PERSONAL FACTORS:  3+ comorbidities: see PMH above are also affecting patient's functional outcome.   REHAB POTENTIAL: Good  CLINICAL DECISION MAKING: Evolving/moderate complexity  EVALUATION COMPLEXITY: Moderate  PLAN:  PT FREQUENCY: 2x/week  PT DURATION: 6 weeks per recert 10/27/2024  PLANNED INTERVENTIONS: 97750- Physical Performance Testing, 97110-Therapeutic exercises, 97530- Therapeutic activity, 97112- Neuromuscular re-education, 97535- Self Care, [id number]- Manual therapy, [phone]- Gait training, Patient/Family education, Balance training, Stair training, and DME instructions  PLAN FOR NEXT SESSION:  ***SLS, glut activation in standing.  Continue weight progression/resistance with standing exercises at counter.  Gait training and balance training in clinic no device as appropriate.    Greig Anon, PT 11/15/24 7:57 AM Phone: [phone] Fax: [phone]  [facility] [address] [place], [address] [place], [place] [address] Phone # [phone] Fax # [phone]

## 2024-11-16 NOTE — Telephone Encounter (Signed)
Please review patient message and advise 

## 2024-11-16 NOTE — Therapy (Signed)
 OUTPATIENT PHYSICAL THERAPY NEURO TREATMENT NOTE  Patient Name: Monica Curry MRN: [id number] DOB:1931/04/04, 89 y.o., female Today's Date: 11/16/2024   PCP: Wendolyn Jenkins Jansky, MD REFERRING PROVIDER: Danton Lauraine LABOR, PA-C      END OF SESSION:                Past Medical History:  Diagnosis Date   Allergy    Medications and pollen   Anemia    Anxiety    Always   C. difficile colitis    Cancer [facility])    Endometrial cancer   Cataract    Had surgery 2021   Chronic kidney disease 2018   2 siblings had kidney failure, sister had transplant   Clotting disorder    Essential and other specified forms of tremor    Hyperlipidemia    Hypertension    Osteoporosis 11/2019   T score -2.8 stable from prior DEXA   Personal history of venous thrombosis and embolism 1997   Pulmonary embolism (HCC) 06/14/2021   Pulmonary embolus (HCC)    Shingles    Stroke (HCC) 2018   TIA   Thrush    TIA (transient ischemic attack)    carotid US  07/2022: no ICA stenosis bilaterally   Past Surgical History:  Procedure Laterality Date   ABDOMINAL HYSTERECTOMY  1997   APPENDECTOMY  1961   cataract surg Bilateral 2021   Dr. Roz, R eye 8/18, L 8/25   CESAREAN SECTION  1961   EYE SURGERY  2021   Cataract both eyes   FRACTURE SURGERY  2025   Broken hip   INTRAMEDULLARY (IM) NAIL INTERTROCHANTERIC Left 04/28/2024   Procedure: FIXATION, FRACTURE, INTERTROCHANTERIC, WITH INTRAMEDULLARY ROD;  Surgeon: Kendal Franky SQUIBB, MD;  Location: [facility] OR;  Service: Orthopedics;  Laterality: Left;   TONSILLECTOMY AND ADENOIDECTOMY  1944   TOTAL ABDOMINAL HYSTERECTOMY W/ BILATERAL SALPINGOOPHORECTOMY  1997   Patient Active Problem List   Diagnosis Date Noted   RLS (restless legs syndrome) 06/15/2024   Fracture, [place], left, closed 04/28/2024   Hip fracture (HCC) 04/27/2024   GAD (generalized anxiety disorder) 04/01/2024   Thyroid  nodule 01/10/2022   Anxiety 08/10/2021   Aortic atherosclerosis  (HCC) - CT 06/2021 06/15/2021   Bilateral renal cysts 07/06/2020   Paroxysmal supraventricular tachycardia 10/20/2019   History of skin cancer (basal cell) 06/16/2019   Complete right bundle branch block 06/16/2019   B12 deficiency 06/15/2019   History of TIA (transient ischemic attack) 10/23/2017   Chronic kidney disease, stage 3b (HCC) 07/21/2015   Medication management 07/04/2014   Essential hypertension 11/25/2013   Hyperlipidemia, mixed 11/25/2013   Abnormal glucose 11/25/2013   Vitamin D  deficiency 11/25/2013   Essential tremor 11/25/2013   History of endometrial cancer    Hx/o DVT/PE    Urethral prolapse    Osteoporosis 10/10/2011    ONSET DATE: 04/28/2024  REFERRING DIAG: S/P L intertrachanteric femur fracture, Left distal radius fracture 04/28/2024   THERAPY DIAG:  No diagnosis found.  Rationale for Evaluation and Treatment: Rehabilitation  SUBJECTIVE:  SUBJECTIVE STATEMENT: ***Saw Dr. Kendal yesterday-he wasn't concerned about the possibility of leg length difference.  No pain today.  Pt accompanied by: self  PERTINENT HISTORY: osteoporosis, hx of DVT; see additional PMH above  PAIN:  Are you having pain? No   PRECAUTIONS: Fall, wears L compression stocking due to L LE edema (was negative for DVT)  **CTR surgery LUE 10/12/24:  wearing bandage for 5 days, no lifting, to return to MD 11/18 for removal of stitches, follow up  RED FLAGS: None   WEIGHT BEARING RESTRICTIONS: No  FALLS: Has patient fallen in last 6 months? Yes. Number of falls 1  LIVING ENVIRONMENT: Lives with: lives alone Lives in: House/apartment Stairs: 3 steps to enter home;  Has following equipment at home: Single point cane and Walker - 2 wheeled *cane has tripod base and has gotten quad tip (per 10/28  visit) PLOF: Independent  PATIENT GOALS: To be able to transition to the cane, so I can get in and out of home better.  OBJECTIVE:    TODAY'S TREATMENT: 11/17/2024 Activity Comments                       TODAY'S TREATMENT: 11/10/2024 Activity Comments  Reviewed HEP additions for technique:  sidelying hip abduction Bridging varied foot positions   Sit to stand  2 x 5 reps Varied foot positions to initiate gait  Single limb stance drills: -SLS each leg 10 reps, 3-5 sec -SLS with step taps to 8 block -SLS with rolling ball, 3 reps each leg -forward/back stepping 2 x 10 -side step over obstacle x 10 Intermittent UE support Use of mirror for upright posture, activation of gluts  Sit to stand, short distance gait to sit x 4 reps, no device supervision  Gait with cane 100 ft x 2 reps with cues for upright posture/look to target and cues to relax L UE for arm swing/balance       HOME EXERCISE PROGRAM: Access Code: [email or web address] URL: [email or web address] Date: 11/01/2024 Prepared by: [facility] - [facility]  Program Notes Marching in place, side hip kicks, back hip kicks:  Do 3 sets of 10 with 2# weight  Exercises - Backward Walking with Counter Support  - 1 x daily - 5 x weekly - 1 sets - 5 reps - Alternating Step Taps with Counter Support  - 1 x daily - 5 x weekly - 3 sets - 10 reps - Side Stepping with Counter Support  - 1 x daily - 5 x weekly - 1 sets - 5 reps - Corner Balance Feet Together With Eyes Closed  - 1 x daily - 7 x weekly - 3 reps - 30 sec hold - Corner Balance Feet Together: Eyes Open With Head Turns  - 1 x daily - 7 x weekly - 3 reps - 30 sec hold - Corner Balance Feet Together: Eyes Closed With Head Turns  - 1 x daily - 7 x weekly - 3 reps - 30 sec hold - Semi-Tandem Corner Balance With Eyes Open  - 1 x daily - 7 x weekly - 3 reps - 15-30 sec hold - Supine Bridge  - 1 x daily - 3-4 x weekly - 3 sets - 10 reps - Hooklying  Isometric Clamshell  - 1 x daily - 3-4 x weekly - 3 sets - 10 reps - Sit to Stand with Resistance Around Legs  - 1 x daily - 4 x weekly -  2 sets - 10 reps - Seated Hamstring Stretch  - 1 x daily - 7 x weekly - 1 sets - 3 reps - 30 sec hold - Sidelying Hip Abduction  - 1 x daily - 2-3 x weekly - 3 sets - 10 reps       PATIENT EDUCATION: Education details: Posture, use of visual cues for optimal balance, cues for glut activation in SLS Person educated: Patient Education method: Explanation, Demonstration, and Verbal cues Education comprehension: verbalized understanding and returned demonstration      -------------------------------------------- Note: Objective measures were completed at Evaluation unless otherwise noted.  DIAGNOSTIC FINDINGS: Interval proximal left femoral cephalomedullary nail fixation of the previously seen intertrochanteric fracture. Improved alignment.  COGNITION: Overall cognitive status: Within functional limits for tasks assessed   SENSATION: Reports decreased sensation in L fingers (to see hand surgeon)  EDEMA:  Noted LLE; wearing compression stocking  POSTURE: rounded shoulders and increased thoracic kyphosis  LOWER EXTREMITY ROM:     Active  Right Eval Left Eval  Hip flexion    Hip extension    Hip abduction    Hip adduction    Hip internal rotation    Hip external rotation    Knee flexion    Knee extension    Ankle dorsiflexion  10  Ankle plantarflexion  40  Ankle inversion    Ankle eversion     (Blank rows = not tested)  LOWER EXTREMITY MMT:    MMT Right Eval Left Eval  Hip flexion 4 3+  Hip extension    Hip abduction 5 4  Hip adduction 5 4  Hip internal rotation    Hip external rotation    Knee flexion 4+ 4+  Knee extension 4+ 4  Ankle dorsiflexion 4+ 4  Ankle plantarflexion    Ankle inversion    Ankle eversion    (Blank rows = not tested)  TRANSFERS: Sit to stand: Modified independence  Assistive device  utilized: None     Stand to sit: Modified independence  Assistive device utilized: hands at chair      STAIRS: Findings: Level of Assistance: Modified independence, Stair Negotiation Technique: Step to Pattern with Single Rail on Right, Number of Stairs: 2-3, Height of Stairs: 4-6   , and Comments: prior to fracture, pt negotiated steps alternating pattern GAIT: Findings: Gait Characteristics: step through pattern, decreased step length- Right, and decreased stance time- Left, Distance walked: 50 ft x 2, Assistive device utilized:Walker - 2 wheeled and SPC with small quad tip base, Level of assistance: Modified independence, CGA, and Min A, and Comments: Mod I with RW, CGA/min assist with cane  FUNCTIONAL TESTS:  10 meter walk test: 16.78 sec RW (1.95 ft/sec); 33.62 sec with cane (0.98 ft/sec) Berg:  34/56 (Scores <45/56 indicate increased fall risk)  TREATMENT DATE: 08/30/2024    PATIENT EDUCATION: Education details: Eval results, POC, continue HEP from HHPT; verbally added lateral weightshifting to LLE, with attention to light UE support Person educated: Patient Education method: Explanation, Demonstration, and Verbal cues Education comprehension: verbalized understanding, returned demonstration, and needs further education  HOME EXERCISE PROGRAM: Verbally initiated lateral weightshifting; pt doing standing hip kicks, heel/toe raises, marching and sit to stand at home  GOALS: Goals reviewed with patient? Yes  SHORT TERM GOALS: Target date: 10/01/2024  Pt will be independent with HEP for improved gait, stair negotiation, balance. Baseline: no concerns on HEP  09/29/24 Goal status: IN PROGRESS 09/29/24  2.  Pt will improve Berg score to at least 45/56 to decrease fall risk. Baseline: 34/56>44/56 09/27/2024 Goal status: PARTIALLY MET 09/27/2024, MET  10/27/2024  3.  Pt will negotiate curb and 3 steps with cane, mod I, for improved household and community gait. Baseline: stairs with cane with CGA  09/29/24 Goal status: MET, 10/27/2024   LONG TERM GOALS: Target date: 10/29/2024>12/10/2024  Pt will be independent with HEP for improved balance, strength, gait, stairs. Baseline: continuing to update Goal status: IN PROGRESS, 10/27/2024  2.  Pt will improve DGI score to at least 20/24 to decrease fall risk. Baseline: 11/24>16/24 Goal status:IN PROGRESS, 11/19/205  3.  Pt will improve gait velocity to at least 2.6 ft/sec with cane for improved gait efficiency and safety. Baseline: 0.98 ft/sec>2.1 ft/sec with cane 10/27/2024 Goal status: IN PROGRESS, 10/27/2024  4.  Pt will ambulate at least 500 ft, indoor and outdoor gait, mod I with cane for improved community mobility. Baseline: community distances with daughter, per report Goal status: MET, 10/27/2024  5.  Pt will improve TUG score to less than or equal to 13.5 sec without device, for decreased fall risk.  Baseline:  TUG no cane 17.13 sec 10/27/24  Goal status:  INITIAL  6.  Pt will improve TUG manual score to less than or equal to 15 sec for decreased fall risk.  Baseline:  19.87 sec 10/27/24  Goal status:  INITIAL  7.  Pt will improve gait velocity *with no cane* to at least 2.3 ft/sec for improved gait efficiency and safety.  Baseline:  1.82 ft/sec 10/27/24  Goal status:  INITIAL  8.  Pt will improve 5x sit<>stand to less than or equal to 14.5 sec to demonstrate improved functional strength and transfer efficiency.  Baseline:  16.16 sec 10/27/24  Goal status:  INITIAL    ASSESSMENT:  CLINICAL IMPRESSION: Pt presents today ***. Skilled PT session focused on ***. Pt needs ***. Pt will continue to benefit from skilled PT towards goals for improved functional mobility and decreased fall risk.  Pt presents today with no new complaints. Skilled PT session focused on hip  strengthening, glut activation working on single limb stance.  Pt seems pleased as she has increased success with SLS today compared to last visit.  She does best with cues for optimal posture and use of visual cues at mirror for improved glut activation/engagement with improved stability; she does fatigue with multiple reps through session and she reports feeling weaker through L hip.   Pt will continue to benefit from skilled PT towards goals for improved functional mobility and decreased fall risk.  OBJECTIVE IMPAIRMENTS: Abnormal gait, decreased balance, decreased mobility, difficulty walking, and decreased strength.   ACTIVITY LIMITATIONS: standing, stairs, transfers, and locomotion level  PARTICIPATION LIMITATIONS: meal prep, cleaning, driving, shopping, community activity, yard work, and church  PERSONAL FACTORS:  3+ comorbidities: see PMH above are also affecting patient's functional outcome.   REHAB POTENTIAL: Good  CLINICAL DECISION MAKING: Evolving/moderate complexity  EVALUATION COMPLEXITY: Moderate  PLAN:  PT FREQUENCY: 2x/week  PT DURATION: 6 weeks per recert 10/27/2024  PLANNED INTERVENTIONS: 97750- Physical Performance Testing, 97110-Therapeutic exercises, 97530- Therapeutic activity, 97112- Neuromuscular re-education, 97535- Self Care, [id number]- Manual therapy, [phone]- Gait training, Patient/Family education, Balance training, Stair training, and DME instructions  PLAN FOR NEXT SESSION:  ***SLS, glut activation in standing.  Continue weight progression/resistance with standing exercises at counter.  Gait training and balance training in clinic no device as appropriate.    Greig Anon, PT 11/16/24 8:37 AM Phone: [phone] Fax: [phone]  [facility] at [facility] [address] [place], [address] [place], [place] [address] Phone # [phone] Fax # [phone]

## 2024-11-16 NOTE — Telephone Encounter (Signed)
 Please review and advise on patient message. Tks

## 2024-11-17 ENCOUNTER — Ambulatory Visit: Admitting: Occupational Therapy

## 2024-11-17 ENCOUNTER — Ambulatory Visit: Payer: Self-pay | Admitting: Physician Assistant

## 2024-11-17 ENCOUNTER — Other Ambulatory Visit: Payer: Self-pay

## 2024-11-17 ENCOUNTER — Encounter: Payer: Self-pay | Admitting: Physical Therapy

## 2024-11-17 ENCOUNTER — Ambulatory Visit: Admitting: Physical Therapy

## 2024-11-17 DIAGNOSIS — M6281 Muscle weakness (generalized): Secondary | ICD-10-CM | POA: Diagnosis not present

## 2024-11-17 DIAGNOSIS — R2681 Unsteadiness on feet: Secondary | ICD-10-CM

## 2024-11-17 DIAGNOSIS — M25632 Stiffness of left wrist, not elsewhere classified: Secondary | ICD-10-CM

## 2024-11-17 DIAGNOSIS — R278 Other lack of coordination: Secondary | ICD-10-CM

## 2024-11-17 DIAGNOSIS — I10 Essential (primary) hypertension: Secondary | ICD-10-CM

## 2024-11-17 DIAGNOSIS — R2689 Other abnormalities of gait and mobility: Secondary | ICD-10-CM

## 2024-11-17 DIAGNOSIS — R208 Other disturbances of skin sensation: Secondary | ICD-10-CM

## 2024-11-17 MED ORDER — OLMESARTAN MEDOXOMIL 20 MG PO TABS: 10.0000 mg | ORAL_TABLET | Freq: Every day | ORAL | 1 refills | Status: DC

## 2024-11-17 NOTE — Telephone Encounter (Signed)
 Please review and advise. Patient does have appointment with you on Jan 5th.

## 2024-11-17 NOTE — Therapy (Signed)
 OUTPATIENT OCCUPATIONAL THERAPY ORTHO Treatment Note  Patient Name: Monica Curry MRN: [id number] DOB:1931-07-20, 89 y.o., female Today's Date: 11/17/2024  PCP: Monica Jenkins Jansky, MD  REFERRING PROVIDER: Arlinda Buster, MD  END OF SESSION:  OT End of Session - 11/17/24 1220     Visit Number 4    Number of Visits 11    Date for Recertification  12/08/24    Authorization Type [employer]    Authorization Time Period Auth#: [id number] , approved 11 OT visits from 10/27/2024 - 01/25/2025    Authorization - Visit Number 4    Authorization - Number of Visits 11    OT Start Time 1150    OT Stop Time 1232    OT Time Calculation (min) 42 min             Past Medical History:  Diagnosis Date   Allergy    Medications and pollen   Anemia    Anxiety    Always   C. difficile colitis    Cancer (HCC)    Endometrial cancer   Cataract    Had surgery 2021   Chronic kidney disease 2018   2 siblings had kidney failure, sister had transplant   Clotting disorder    Essential and other specified forms of tremor    Hyperlipidemia    Hypertension    Osteoporosis 11/2019   T score -2.8 stable from prior DEXA   Personal history of venous thrombosis and embolism 1997   Pulmonary embolism (HCC) 06/14/2021   Pulmonary embolus (HCC)    Shingles    Stroke (HCC) 2018   TIA   Thrush    TIA (transient ischemic attack)    carotid US  07/2022: no ICA stenosis bilaterally   Past Surgical History:  Procedure Laterality Date   ABDOMINAL HYSTERECTOMY  1997   APPENDECTOMY  1961   cataract surg Bilateral 2021   Dr. Roz, R eye 8/18, L 8/25   CESAREAN SECTION  1961   EYE SURGERY  2021   Cataract both eyes   FRACTURE SURGERY  2025   Broken hip   INTRAMEDULLARY (IM) NAIL INTERTROCHANTERIC Left 04/28/2024   Procedure: FIXATION, FRACTURE, INTERTROCHANTERIC, WITH INTRAMEDULLARY ROD;  Surgeon: Monica Franky SQUIBB, MD;  Location: [facility] OR;  Service: Orthopedics;  Laterality: Left;    TONSILLECTOMY AND ADENOIDECTOMY  1944   TOTAL ABDOMINAL HYSTERECTOMY W/ BILATERAL SALPINGOOPHORECTOMY  1997   Patient Active Problem List   Diagnosis Date Noted   RLS (restless legs syndrome) 06/15/2024   Fracture, Colles, left, closed 04/28/2024   Hip fracture (HCC) 04/27/2024   GAD (generalized anxiety disorder) 04/01/2024   Thyroid  nodule 01/10/2022   Anxiety 08/10/2021   Aortic atherosclerosis (HCC) - CT 06/2021 06/15/2021   Bilateral renal cysts 07/06/2020   Paroxysmal supraventricular tachycardia 10/20/2019   History of skin cancer (basal cell) 06/16/2019   Complete right bundle branch block 06/16/2019   B12 deficiency 06/15/2019   History of TIA (transient ischemic attack) 10/23/2017   Chronic kidney disease, stage 3b (HCC) 07/21/2015   Medication management 07/04/2014   Essential hypertension 11/25/2013   Hyperlipidemia, mixed 11/25/2013   Abnormal glucose 11/25/2013   Vitamin D  deficiency 11/25/2013   Essential tremor 11/25/2013   History of endometrial cancer    Hx/o DVT/PE    Urethral prolapse    Osteoporosis 10/10/2011    ONSET DATE: referral date 10/07/24 (injury 04/27/24 and [facility] sx 10/12/24)  REFERRING DIAG: G56.02 (ICD-10-CM) - Carpal tunnel syndrome, left upper limb  THERAPY DIAG:  Stiffness of left wrist, not elsewhere classified  Other lack of coordination  Other disturbances of skin sensation  Muscle weakness (generalized)  Rationale for Evaluation and Treatment: Rehabilitation  SUBJECTIVE:   SUBJECTIVE STATEMENT: Pt reports that she gets so frustrated with this hand and find that she contracts and overcompensates with her upper arm when trying to use it.    Pt accompanied by: self  PERTINENT HISTORY:   Per MD note 10/26/24: 89 y.o. female who presents today for follow up 2 weeks status post left wrist open carpal tunnel release.  Doing well overall, pain controlled, does have persistent numbness and tingling.  Left hand: - Well-healing palmar  incision, sutures removed, skin edges well-approximated without erythema or drainage - Composite fist limited secondary to stiffness, improved passively - Sensation remains diminished to light touch in the median nerve distribution - 4/5 APB mild thenar atrophy, thumb opposition to ring finger PIP  PRECAUTIONS: Fall  WEIGHT BEARING RESTRICTIONS: No  PAIN:  Are you having pain? Yes: NPRS scale: minimal Pain location: palm of hand Pain description: twinge Aggravating factors: touching hand around incision Relieving factors: being still  FALLS: Has patient fallen in last 6 months? Yes. Number of falls 1 - fall in May that resulted in hip and wrist fractures.  LIVING ENVIRONMENT: Lives with: lives alone Lives in: House/apartment Stairs: 3 steps to enter, flight of steps to the basement but she does not go down them Has following equipment at home: [name] - 2 wheeled, single point cane, shower chair, and Grab bars  PLOF: Independent with basic ADLs and Requires assistive device for independence; step-daughter is currently doing laundry at her home as pt's washer/dryer are in the basement  PATIENT GOALS: to be able to use Left hand again   NEXT MD VISIT: 11/23/24  OBJECTIVE:  Note: Objective measures were completed at Evaluation unless otherwise noted.  HAND DOMINANCE: Right  ADLs: WFL; difficulty with donning compression sock on L leg but is able to don  FUNCTIONAL OUTCOME MEASURES: Quick Dash: 50%   UPPER EXTREMITY ROM:     Active ROM Right eval Left eval  Shoulder flexion    Shoulder abduction    Shoulder adduction    Shoulder extension    Shoulder internal rotation    Shoulder external rotation    Elbow flexion    Elbow extension    Wrist flexion 75 34  Wrist extension 60 17  Wrist ulnar deviation 30 18  Wrist radial deviation 12 10  Wrist pronation  WFL  Wrist supination  80%  (Blank rows = not tested)  Loose grasp on L, however all fingers are able to  touch palm of hand lightly in open fist, decreased full fist, hook fist, and table top  HAND FUNCTION: Not assessed due to stitches just removed yesterday.  On eval prior to surgery 08/30/24: Right: 40 lbs; Left: 5 lbs  COORDINATION: 9 Hole Peg test: Right: 30.13 sec; Left: 61.75 sec; Mild B tremors  SENSATION: Numbness and tingling in R hand and digits along median nerve distribution  EDEMA: NA  COGNITION: Overall cognitive status: Within functional limits for tasks assessed   OBSERVATIONS: Pt L wrist at ulnar head and dorsal wrist enlarged compared to R wrist and hand due to bone deformity at dorsal wrist and ulnar styloid s/p setting of fracture back in May 2025. Pt demonstrating ~50% movement in all directions when compared to R, but verbalizing good understanding of exercises.    TREATMENT DATE:  11/17/24  Scar mobilization/massage: OT providing massage in circular, side to side, and up/down direction around incision to  increase the range of motion around the scar.  OT progressed to light pressure over incision in circular and up/down direction.  OT educating on progressive pressure as healing continues and increased massage at incision due to progress in healing.   Manual therapy: OT providing stretch and hold x5 for 5-10 seconds in to radial/ulnar deviation and wrist flexion/extension.  Pt with no c/o pain. Self-care: engaged in simulated picking up and carrying of plate to address arm and hand positioning to decrease over compensation of upper arm and shoulder.  Pt demonstrating improved control and ease with keeping arm in closer to body.  Also discussed potential benefit of use of RW with tray when transporting dishes/food for increased safety and not over compensating with shoulder elevation.   Therapeutic activity: engaged in stacking and unstacking cups with LUE with exaggerated radial and ulnar deviation, followed by rotation of cups to incorporate supination.  Pt again  reporting initial overuse and compensation of shoulder movements, but with cues to attend to shoulder and relax pt with improved ROM. Modalities: educated on use of rice sock or heating pad as pt reporting that warm towel does not stay warm long enough.  Encouraged heat for 10 mins prior to ROM HEP.   11/10/24 Of note: pt's left hand and forearm blueish-purple, however once positioned with elbow propped on table typical color resumed.  Pt reporting that she has been doing a lot of walking with PT with arms down.  OT educating on elevating hand at rest for improved circulation.   ROM: engaged in active wrist flexion and extension as well as radial/ulnar deviation. OT then instructing pt in PROM for wrist flexion and extension with forearm resting on table top to allow gravity to aid in further ROM, followed by slight pressure over top to facilitate increased ROM.  Pt completing each direction x10.  Strengthening: utilizing light resistance stress ball, pt completing full fist, 3 jaw chuck, and duck mouth x10 each.  OT educating on improved positioning for improved ROM and strengthening.   Educated on presence of pain with ROM, however encouraging pt to stop if pain present > 2/10 increase or if it lingers > 30-60 mins post activity.     11/01/24 Scar mobilization/massage: OT providing massage in circular, side to side, and up/down direction around incision to  increase the range of motion around the scar.  OT progressed to light pressure over incision in circular and up/down direction.  OT educating on progressive pressure as healing continues and to avoid areas with scabbing. Manual desensitization: educating on tolerance to progressive consistencies of textures along incision starting with cotton ball or soft cotton and progressing to wash cloth to decrease hypersensitivity. Modalities: educated on use of heat over wrist and/or finger joints to increase ROM.  Reiterating not submerging incision in  water but use of heat pack or running water over hands prior to HEP to decrease pain and facilitate increased ROM.    PATIENT EDUCATION: Education details: massage, ROM, use of heat Person educated: Patient Education method: Explanation, Demonstration, Verbal cues, and Handouts Education comprehension: verbalized understanding and needs further education  HOME EXERCISE PROGRAM: Carpal tunnel release HEP (see pt instructions)  Access Code: [email or web address] URL: [email or web address] Date: 11/10/2024 Prepared by: [facility] - Outpatient  Rehab - [facility]  Exercises - Putty Squeezes  - 2 x daily - 15 reps - 3-Point Pinch with Putty  -  2 x daily - 15 reps - Finger Lumbricals with Putty  - 2 x daily - 15 reps - Seated Wrist Extension Stretch  - 2 x daily - 5-10 reps - Seated Wrist Flexion Stretch  - 2 x daily - 5-10 reps  GOALS: Goals reviewed with patient? Yes  SHORT TERM GOALS: Target date: 11/19/24  Pt will be independent with HEP for ROM and strengthening. Baseline: new to OPOT Goal status: in progress   2.  Pt will verbalize understanding of task modifications and/or potential A/E needs to increase ease, safety, and independence w/ ADLs. Baseline: difficulty with donning compression socks d/t decreased grip and mobility of L wrist/hand Goal status: in progress   3.  Pt will demonstrate improvements in wrist flexion/extension and functional pinch to complete 9 hole peg test with LUE in <55 seconds. Baseline: 61 sec Goal status: in progress  4.  Pt will verbalize understanding of re-sensitization strategies as well as compensatory strategies due to impaired sensation.  Baseline: impaired sensation in hand at median nerve distribution  Goal status: in progress    LONG TERM GOALS: Target date: 12/08/24  Pt will demonstrate improved coordination of LUE as evidenced by completing 9 hole peg test in 45 seconds or less as needed for [facility] tasks with  ADLs/IADLS. Baseline: 61 Goal status: in progress  2.  Pt will improve grip strength in left hand to be within at least 20 pounds of her right hand for functional use at home and in IADLs.  Baseline: L: 5# and R: 40# Goal status: in progress   3.  Pt will demonstrate improved L wrist flexion/extension to 75% of R wrist flexion/extension for improved functional use.  Baseline: R:75/60 and L: 34/17 Goal status: in progress   4.  Pt will demonstrate improved L forearm supination to 100% of R forearm supination for improved functional use. Baseline: 80% Goal status: in progress   5.  Pt will demonstrate improvements in pain and functional use of LUE as evidenced by decreased score on QuickDASH from 50% impairement to < 30 % impairment.  Baseline: 50% Goal status: in progress  6.  Pt will verbalize understanding of safe modifications and/or adaptive techniques to complete laundry tasks at Mod I level. Baseline: step-daughter is currently completing due to laundry room being in basement Goal status: in progress  ASSESSMENT:  CLINICAL IMPRESSION: Patient is a 89 y.o. female who was seen today for occupational therapy treatment s/p L carpal tunnel release. Pt is reporting decreased pain, however stiffness and diminished sensation persisting.  Pt reporting continued healing of incision, but reports increased stiffness with radial/ulnar deviation and overcompensation of shoulder during household tasks.  Pt receptive to modifications and cues to relax shoulder.  Pt tolerating passive ROM for increased stretch. Pt will continue to benefit from skilled OT services to address ROM, strengthening, and improved functional use of LUE.  PERFORMANCE DEFICITS: in functional skills including ADLs, IADLs, coordination, sensation, edema, ROM, strength, flexibility, Fine motor control, Gross motor control, body mechanics, endurance, decreased knowledge of precautions, and UE functional use and psychosocial  skills including routines and behaviors.      PLAN:  OT FREQUENCY: 1-2x/week  OT DURATION: 6 weeks  PLANNED INTERVENTIONS: 97168 OT Re-evaluation, 97535 self care/ADL training, 02889 therapeutic exercise, 97530 therapeutic activity, 97112 neuromuscular re-education, 97140 manual therapy, 97035 ultrasound, 97018 paraffin, 02239 Orthotic Initial, [id number] Orthotic/Prosthetic subsequent, passive range of motion, compression bandaging, coping strategies training, patient/family education, and DME and/or AE instructions  RECOMMENDED OTHER SERVICES: NA  CONSULTED AND AGREED WITH PLAN OF CARE: Patient  PLAN FOR NEXT SESSION: Review HEP, review scar massage, use of modalities for ROM and pain relief, after 4-6 weeks s/p sx can start light strengthening    Royce Sciara, OTR/L 11/17/2024, 12:47 PM  [facility] at [facility] [address] [place], [place] [address] Phone # [phone] Fax # [phone]

## 2024-11-17 NOTE — Telephone Encounter (Signed)
 Spoke with patient. Got the correct dosage, new medication ordered and follow-up appointment made.

## 2024-11-22 ENCOUNTER — Ambulatory Visit: Admitting: Occupational Therapy

## 2024-11-22 ENCOUNTER — Ambulatory Visit

## 2024-11-22 DIAGNOSIS — M6281 Muscle weakness (generalized): Secondary | ICD-10-CM

## 2024-11-22 DIAGNOSIS — R208 Other disturbances of skin sensation: Secondary | ICD-10-CM

## 2024-11-22 DIAGNOSIS — R2681 Unsteadiness on feet: Secondary | ICD-10-CM

## 2024-11-22 DIAGNOSIS — M25632 Stiffness of left wrist, not elsewhere classified: Secondary | ICD-10-CM

## 2024-11-22 DIAGNOSIS — R278 Other lack of coordination: Secondary | ICD-10-CM

## 2024-11-22 DIAGNOSIS — R2689 Other abnormalities of gait and mobility: Secondary | ICD-10-CM

## 2024-11-22 NOTE — Therapy (Signed)
 OUTPATIENT PHYSICAL THERAPY NEURO TREATMENT NOTE  Patient Name: Monica Curry MRN: [id number] DOB:Jun 10, 1931, 89 y.o., female Today's Date: 11/22/2024   PCP: Wendolyn Jenkins Jansky, MD REFERRING PROVIDER: Danton Lauraine LABOR, PA-C      END OF SESSION:  PT End of Session - 11/22/24 1148     Visit Number 19    Number of Visits 26    Date for Recertification  12/10/24    Authorization Type [employer]-reauth submitted    Authorization Time Period 12 visits approved from 11/24 to 12/10/24    Authorization - Visit Number 5    Authorization - Number of Visits 12    Progress Note Due on Visit 20    PT Start Time 1145    PT Stop Time 1230    PT Time Calculation (min) 45 min    Equipment Utilized During Treatment Gait belt    Activity Tolerance Patient tolerated treatment well    Behavior During Therapy WFL for tasks assessed/performed                       Past Medical History:  Diagnosis Date   Allergy    Medications and pollen   Anemia    Anxiety    Always   C. difficile colitis    Cancer (HCC)    Endometrial cancer   Cataract    Had surgery 2021   Chronic kidney disease 2018   2 siblings had kidney failure, sister had transplant   Clotting disorder    Essential and other specified forms of tremor    Hyperlipidemia    Hypertension    Osteoporosis 11/2019   T score -2.8 stable from prior DEXA   Personal history of venous thrombosis and embolism 1997   Pulmonary embolism (HCC) 06/14/2021   Pulmonary embolus (HCC)    Shingles    Stroke (HCC) 2018   TIA   Thrush    TIA (transient ischemic attack)    carotid US  07/2022: no ICA stenosis bilaterally   Past Surgical History:  Procedure Laterality Date   ABDOMINAL HYSTERECTOMY  1997   APPENDECTOMY  1961   cataract surg Bilateral 2021   Dr. Roz, R eye 8/18, L 8/25   CESAREAN SECTION  1961   EYE SURGERY  2021   Cataract both eyes   FRACTURE SURGERY  2025   Broken hip   INTRAMEDULLARY (IM) NAIL  INTERTROCHANTERIC Left 04/28/2024   Procedure: FIXATION, FRACTURE, INTERTROCHANTERIC, WITH INTRAMEDULLARY ROD;  Surgeon: Kendal Franky SQUIBB, MD;  Location: [facility] OR;  Service: Orthopedics;  Laterality: Left;   TONSILLECTOMY AND ADENOIDECTOMY  1944   TOTAL ABDOMINAL HYSTERECTOMY W/ BILATERAL SALPINGOOPHORECTOMY  1997   Patient Active Problem List   Diagnosis Date Noted   RLS (restless legs syndrome) 06/15/2024   Fracture, [place], left, closed 04/28/2024   Hip fracture (HCC) 04/27/2024   GAD (generalized anxiety disorder) 04/01/2024   Thyroid  nodule 01/10/2022   Anxiety 08/10/2021   Aortic atherosclerosis (HCC) - CT 06/2021 06/15/2021   Bilateral renal cysts 07/06/2020   Paroxysmal supraventricular tachycardia 10/20/2019   History of skin cancer (basal cell) 06/16/2019   Complete right bundle branch block 06/16/2019   B12 deficiency 06/15/2019   History of TIA (transient ischemic attack) 10/23/2017   Chronic kidney disease, stage 3b (HCC) 07/21/2015   Medication management 07/04/2014   Essential hypertension 11/25/2013   Hyperlipidemia, mixed 11/25/2013   Abnormal glucose 11/25/2013   Vitamin D  deficiency 11/25/2013   Essential tremor 11/25/2013  History of endometrial cancer    Hx/o DVT/PE    Urethral prolapse    Osteoporosis 10/10/2011    ONSET DATE: 04/28/2024  REFERRING DIAG: S/P L intertrachanteric femur fracture, Left distal radius fracture 04/28/2024   THERAPY DIAG:  Muscle weakness (generalized)  Other abnormalities of gait and mobility  Unsteadiness on feet  Rationale for Evaluation and Treatment: Rehabilitation  SUBJECTIVE:                                                                                                                                                                                             SUBJECTIVE STATEMENT: No changes since last session.   Pt accompanied by: self  PERTINENT HISTORY: osteoporosis, hx of DVT; see additional PMH above  PAIN:   Are you having pain? No   PRECAUTIONS: Fall, wears L compression stocking due to L LE edema (was negative for DVT)  **CTR surgery LUE 10/12/24:  wearing bandage for 5 days, no lifting, to return to MD 11/18 for removal of stitches, follow up  RED FLAGS: None   WEIGHT BEARING RESTRICTIONS: No  FALLS: Has patient fallen in last 6 months? Yes. Number of falls 1  LIVING ENVIRONMENT: Lives with: lives alone Lives in: House/apartment Stairs: 3 steps to enter home;  Has following equipment at home: Single point cane and Walker - 2 wheeled *cane has tripod base and has gotten quad tip (per 10/28 visit) PLOF: Independent  PATIENT GOALS: To be able to transition to the cane, so I can get in and out of home better.  OBJECTIVE:   TODAY'S TREATMENT: 11/22/24 Activity Comments  Drills for single limb support Step and slide cones Lift, hover, hold Obstacle negotiation Multi-tasking gait around obstacles    Gait training -techniuqes to improve LLE stance control to minimize Trendelenberg.  1.5 min without AD before fatigue limits                  TODAY'S TREATMENT: 11/17/2024 Activity Comments  SLS + rolling ball under foot Occasionally able to perform withotu UE support. More difficulty moving L LE and standing on R LE  SLS ring toss game Required 1 UE support and slight manual assist from PT  hip hikes on step  PT assists to maintain standing knee straight; limited ROM but good improvement in form and muscle recruitment after practice   sidestepping with red TB above knees 3x1 min 2nd set around ankles for increased challenge ; 3rd set around forefoot ; cues for longer steps       HOME EXERCISE PROGRAM: Access Code: [email or web address] URL: [email or web address] Date: 11/01/2024 Prepared by: [facility] -  Outpatient  Rehab - [facility]  Program Notes Marching in place, side hip kicks, back hip kicks:  Do 3 sets of 10 with 2# weight  Exercises - Backward Walking  with Counter Support  - 1 x daily - 5 x weekly - 1 sets - 5 reps - Alternating Step Taps with Counter Support  - 1 x daily - 5 x weekly - 3 sets - 10 reps - Side Stepping with Counter Support  - 1 x daily - 5 x weekly - 1 sets - 5 reps - Corner Balance Feet Together With Eyes Closed  - 1 x daily - 7 x weekly - 3 reps - 30 sec hold - Corner Balance Feet Together: Eyes Open With Head Turns  - 1 x daily - 7 x weekly - 3 reps - 30 sec hold - Corner Balance Feet Together: Eyes Closed With Head Turns  - 1 x daily - 7 x weekly - 3 reps - 30 sec hold - Semi-Tandem Corner Balance With Eyes Open  - 1 x daily - 7 x weekly - 3 reps - 15-30 sec hold - Supine Bridge  - 1 x daily - 3-4 x weekly - 3 sets - 10 reps - Hooklying Isometric Clamshell  - 1 x daily - 3-4 x weekly - 3 sets - 10 reps - Sit to Stand with Resistance Around Legs  - 1 x daily - 4 x weekly - 2 sets - 10 reps - Seated Hamstring Stretch  - 1 x daily - 7 x weekly - 1 sets - 3 reps - 30 sec hold - Sidelying Hip Abduction  - 1 x daily - 2-3 x weekly - 3 sets - 10 reps       PATIENT EDUCATION: Education details: Posture, use of visual cues for optimal balance, cues for glut activation in SLS Person educated: Patient Education method: Explanation, Demonstration, and Verbal cues Education comprehension: verbalized understanding and returned demonstration      -------------------------------------------- Note: Objective measures were completed at Evaluation unless otherwise noted.  DIAGNOSTIC FINDINGS: Interval proximal left femoral cephalomedullary nail fixation of the previously seen intertrochanteric fracture. Improved alignment.  COGNITION: Overall cognitive status: Within functional limits for tasks assessed   SENSATION: Reports decreased sensation in L fingers (to see hand surgeon)  EDEMA:  Noted LLE; wearing compression stocking  POSTURE: rounded shoulders and increased thoracic kyphosis  LOWER EXTREMITY ROM:      Active  Right Eval Left Eval  Hip flexion    Hip extension    Hip abduction    Hip adduction    Hip internal rotation    Hip external rotation    Knee flexion    Knee extension    Ankle dorsiflexion  10  Ankle plantarflexion  40  Ankle inversion    Ankle eversion     (Blank rows = not tested)  LOWER EXTREMITY MMT:    MMT Right Eval Left Eval  Hip flexion 4 3+  Hip extension    Hip abduction 5 4  Hip adduction 5 4  Hip internal rotation    Hip external rotation    Knee flexion 4+ 4+  Knee extension 4+ 4  Ankle dorsiflexion 4+ 4  Ankle plantarflexion    Ankle inversion    Ankle eversion    (Blank rows = not tested)  TRANSFERS: Sit to stand: Modified independence  Assistive device utilized: None     Stand to sit: Modified independence  Assistive device utilized:  hands at chair      STAIRS: Findings: Level of Assistance: Modified independence, Stair Negotiation Technique: Step to Pattern with Single Rail on Right, Number of Stairs: 2-3, Height of Stairs: 4-6   , and Comments: prior to fracture, pt negotiated steps alternating pattern GAIT: Findings: Gait Characteristics: step through pattern, decreased step length- Right, and decreased stance time- Left, Distance walked: 50 ft x 2, Assistive device utilized:Walker - 2 wheeled and SPC with small quad tip base, Level of assistance: Modified independence, CGA, and Min A, and Comments: Mod I with RW, CGA/min assist with cane  FUNCTIONAL TESTS:  10 meter walk test: 16.78 sec RW (1.95 ft/sec); 33.62 sec with cane (0.98 ft/sec) Berg:  34/56 (Scores <45/56 indicate increased fall risk)                                                                                                                                 TREATMENT DATE: 08/30/2024    PATIENT EDUCATION: Education details: Eval results, POC, continue HEP from HHPT; verbally added lateral weightshifting to LLE, with attention to light UE support Person  educated: Patient Education method: Explanation, Demonstration, and Verbal cues Education comprehension: verbalized understanding, returned demonstration, and needs further education  HOME EXERCISE PROGRAM: Verbally initiated lateral weightshifting; pt doing standing hip kicks, heel/toe raises, marching and sit to stand at home  GOALS: Goals reviewed with patient? Yes  SHORT TERM GOALS: Target date: 10/01/2024  Pt will be independent with HEP for improved gait, stair negotiation, balance. Baseline: no concerns on HEP  09/29/24 Goal status: IN PROGRESS 09/29/24  2.  Pt will improve Berg score to at least 45/56 to decrease fall risk. Baseline: 34/56>44/56 09/27/2024 Goal status: PARTIALLY MET 09/27/2024, MET 10/27/2024  3.  Pt will negotiate curb and 3 steps with cane, mod I, for improved household and community gait. Baseline: stairs with cane with CGA  09/29/24 Goal status: MET, 10/27/2024   LONG TERM GOALS: Target date: 10/29/2024>12/10/2024  Pt will be independent with HEP for improved balance, strength, gait, stairs. Baseline: continuing to update Goal status: IN PROGRESS, 10/27/2024  2.  Pt will improve DGI score to at least 20/24 to decrease fall risk. Baseline: 11/24>16/24 Goal status:IN PROGRESS, 11/19/205  3.  Pt will improve gait velocity to at least 2.6 ft/sec with cane for improved gait efficiency and safety. Baseline: 0.98 ft/sec>2.1 ft/sec with cane 10/27/2024 Goal status: IN PROGRESS, 10/27/2024  4.  Pt will ambulate at least 500 ft, indoor and outdoor gait, mod I with cane for improved community mobility. Baseline: community distances with daughter, per report Goal status: MET, 10/27/2024  5.  Pt will improve TUG score to less than or equal to 13.5 sec without device, for decreased fall risk.  Baseline:  TUG no cane 17.13 sec 10/27/24  Goal status:  INITIAL  6.  Pt will improve TUG manual score to less than or equal to 15 sec for decreased fall  risk.  Baseline:  19.87 sec 10/27/24  Goal status:  INITIAL  7.  Pt will improve gait velocity *with no cane* to at least 2.3 ft/sec for improved gait efficiency and safety.  Baseline:  1.82 ft/sec 10/27/24  Goal status:  INITIAL  8.  Pt will improve 5x sit<>stand to less than or equal to 14.5 sec to demonstrate improved functional strength and transfer efficiency.  Baseline:  16.16 sec 10/27/24  Goal status:  INITIAL    ASSESSMENT:  CLINICAL IMPRESSION: Progressed balance/neuro re-ed activities for improving left single leg stance/hip abd control to improve gait pattern w/out AD.  Continues to exhibit left Trendelenberg with improved control to static stance phase practice with use of mirror for visualization.  Difficulty with carryover to gait as by 1.5 min of ambulation w/out AD LLE fatigue limits.  Progressing well with activities and tolerance and making strides towards ambulation w/out AD. Continued sessions to advance POC details and improve LLE strength/coordination/control OBJECTIVE IMPAIRMENTS: Abnormal gait, decreased balance, decreased mobility, difficulty walking, and decreased strength.   ACTIVITY LIMITATIONS: standing, stairs, transfers, and locomotion level  PARTICIPATION LIMITATIONS: meal prep, cleaning, driving, shopping, community activity, yard work, and church  PERSONAL FACTORS: 3+ comorbidities: see PMH above are also affecting patient's functional outcome.   REHAB POTENTIAL: Good  CLINICAL DECISION MAKING: Evolving/moderate complexity  EVALUATION COMPLEXITY: Moderate  PLAN:  PT FREQUENCY: 2x/week  PT DURATION: 6 weeks per recert 10/27/2024  PLANNED INTERVENTIONS: 97750- Physical Performance Testing, 97110-Therapeutic exercises, 97530- Therapeutic activity, 97112- Neuromuscular re-education, 97535- Self Care, [id number]- Manual therapy, [phone]- Gait training, Patient/Family education, Balance training, Stair training, and DME instructions  PLAN FOR NEXT  SESSION:  SLS, glut activation in standing.  Continue weight progression/resistance with standing exercises at counter.  Gait training and balance training in clinic no device as appropriate.    12:39 PM, 11/22/2024 M. Kelly Mikayah Joy, PT, DPT Physical Therapist- [place] Office Number: [phone]

## 2024-11-22 NOTE — Therapy (Signed)
 OUTPATIENT OCCUPATIONAL THERAPY ORTHO Treatment Note  Patient Name: Monica Curry MRN: [id number] DOB:[birth year], 89 y.o., female Today's Date: 11/22/2024  PCP: Wendolyn Jenkins Jansky, MD  REFERRING PROVIDER: Arlinda Buster, MD  END OF SESSION:  OT End of Session - 11/22/24 1114     Visit Number 5    Number of Visits 11    Date for Recertification  12/08/24    Authorization Type [employer]    Authorization Time Period Auth#: [id number] , approved 11 OT visits from 10/27/2024 - 01/25/2025    Authorization - Number of Visits 11    OT Start Time 1103    OT Stop Time 1143    OT Time Calculation (min) 40 min              Past Medical History:  Diagnosis Date   Allergy    Medications and pollen   Anemia    Anxiety    Always   C. difficile colitis    Cancer (HCC)    Endometrial cancer   Cataract    Had surgery 2021   Chronic kidney disease 2018   2 siblings had kidney failure, sister had transplant   Clotting disorder    Essential and other specified forms of tremor    Hyperlipidemia    Hypertension    Osteoporosis 11/2019   T score -2.8 stable from prior DEXA   Personal history of venous thrombosis and embolism 1997   Pulmonary embolism (HCC) 06/14/2021   Pulmonary embolus (HCC)    Shingles    Stroke (HCC) 2018   TIA   Thrush    TIA (transient ischemic attack)    carotid US  07/2022: no ICA stenosis bilaterally   Past Surgical History:  Procedure Laterality Date   ABDOMINAL HYSTERECTOMY  1997   APPENDECTOMY  1961   cataract surg Bilateral 2021   Dr. Roz, R eye 8/18, L 8/25   CESAREAN SECTION  1961   EYE SURGERY  2021   Cataract both eyes   FRACTURE SURGERY  2025   Broken hip   INTRAMEDULLARY (IM) NAIL INTERTROCHANTERIC Left 04/28/2024   Procedure: FIXATION, FRACTURE, INTERTROCHANTERIC, WITH INTRAMEDULLARY ROD;  Surgeon: Kendal Franky SQUIBB, MD;  Location: [facility] OR;  Service: Orthopedics;  Laterality: Left;   TONSILLECTOMY AND ADENOIDECTOMY  1944    TOTAL ABDOMINAL HYSTERECTOMY W/ BILATERAL SALPINGOOPHORECTOMY  1997   Patient Active Problem List   Diagnosis Date Noted   RLS (restless legs syndrome) 06/15/2024   Fracture, Colles, left, closed 04/28/2024   Hip fracture (HCC) 04/27/2024   GAD (generalized anxiety disorder) 04/01/2024   Thyroid  nodule 01/10/2022   Anxiety 08/10/2021   Aortic atherosclerosis (HCC) - CT 06/2021 06/15/2021   Bilateral renal cysts 07/06/2020   Paroxysmal supraventricular tachycardia 10/20/2019   History of skin cancer (basal cell) 06/16/2019   Complete right bundle branch block 06/16/2019   B12 deficiency 06/15/2019   History of TIA (transient ischemic attack) 10/23/2017   Chronic kidney disease, stage 3b (HCC) 07/21/2015   Medication management 07/04/2014   Essential hypertension 11/25/2013   Hyperlipidemia, mixed 11/25/2013   Abnormal glucose 11/25/2013   Vitamin D  deficiency 11/25/2013   Essential tremor 11/25/2013   History of endometrial cancer    Hx/o DVT/PE    Urethral prolapse    Osteoporosis 10/10/2011    ONSET DATE: referral date 10/07/24 (injury 04/27/24 and [facility] sx 10/12/24)  REFERRING DIAG: G56.02 (ICD-10-CM) - Carpal tunnel syndrome, left upper limb  THERAPY DIAG:  Stiffness of left wrist, not elsewhere  classified  Other lack of coordination  Other disturbances of skin sensation  Muscle weakness (generalized)  Rationale for Evaluation and Treatment: Rehabilitation  SUBJECTIVE:   SUBJECTIVE STATEMENT: Pt reporting that she was doing some cooking and baking this weekend.  She stated I didn't do my exercises as much, but was pleased that she was using her hand some during functional tasks.  Pt accompanied by: self  PERTINENT HISTORY:   Per MD note 10/26/24: 89 y.o. female who presents today for follow up 2 weeks status post left wrist open carpal tunnel release.  Doing well overall, pain controlled, does have persistent numbness and tingling.  Left hand: - Well-healing  palmar incision, sutures removed, skin edges well-approximated without erythema or drainage - Composite fist limited secondary to stiffness, improved passively - Sensation remains diminished to light touch in the median nerve distribution - 4/5 APB mild thenar atrophy, thumb opposition to ring finger PIP  PRECAUTIONS: Fall  WEIGHT BEARING RESTRICTIONS: No  PAIN:  Are you having pain? Yes: NPRS scale: minimal Pain location: palm of hand Pain description: twinge Aggravating factors: touching hand around incision Relieving factors: being still  FALLS: Has patient fallen in last 6 months? Yes. Number of falls 1 - fall in May that resulted in hip and wrist fractures.  LIVING ENVIRONMENT: Lives with: lives alone Lives in: House/apartment Stairs: 3 steps to enter, flight of steps to the basement but she does not go down them Has following equipment at home: [name] - 2 wheeled, single point cane, shower chair, and Grab bars  PLOF: Independent with basic ADLs and Requires assistive device for independence; step-daughter is currently doing laundry at her home as pt's washer/dryer are in the basement  PATIENT GOALS: to be able to use Left hand again   NEXT MD VISIT: 11/23/24  OBJECTIVE:  Note: Objective measures were completed at Evaluation unless otherwise noted.  HAND DOMINANCE: Right  ADLs: WFL; difficulty with donning compression sock on L leg but is able to don  FUNCTIONAL OUTCOME MEASURES: Quick Dash: 50%   UPPER EXTREMITY ROM:     Active ROM Right eval Left eval Left 11/22/24  Shoulder flexion     Shoulder abduction     Shoulder adduction     Shoulder extension     Shoulder internal rotation     Shoulder external rotation     Elbow flexion     Elbow extension     Wrist flexion 75 34 30  Wrist extension 60 17 40  Wrist ulnar deviation 30 18 22   Wrist radial deviation 12 10 8   Wrist pronation  WFL   Wrist supination  80%   (Blank rows = not tested)  Loose  grasp on L, however all fingers are able to touch palm of hand lightly in open fist, decreased full fist, hook fist, and table top  HAND FUNCTION: Not assessed due to stitches just removed yesterday.  On eval prior to surgery 08/30/24: Right: 40 lbs; Left: 5 lbs  COORDINATION: 9 Hole Peg test: Right: 30.13 sec; Left: 61.75 sec; Mild B tremors  SENSATION: Numbness and tingling in R hand and digits along median nerve distribution  EDEMA: NA  COGNITION: Overall cognitive status: Within functional limits for tasks assessed   OBSERVATIONS: Pt L wrist at ulnar head and dorsal wrist enlarged compared to R wrist and hand due to bone deformity at dorsal wrist and ulnar styloid s/p setting of fracture back in May 2025. Pt demonstrating ~50% movement in all directions  when compared to R, but verbalizing good understanding of exercises.    TREATMENT DATE:  11/22/24 Heat: applied moist heat to L wrist in preparation for ROM exercises.  OT reiterating education on checking skin prior to and post heat for skin integrity.  Pt reporting that she has been thinking about relaxing her shoulder when she is cooking/baking or transporting items with good success.  Discussed scheduling with approved visits and remaining certification, adding visits through 12/10/24 (plan to assess goals on 12/07/24 and either d/c or re-cert). ROM: pt completing active wrist flexion/extension and ulnar/radial deviation with hold at end range x5 seconds, completing each exercise x10.  Completed tendon glides with table top, flat fist, and hook fist.  OT encouraging over pressure during flat fist and hook fist to attempt increased PROM.   Measurements taken: see above Scar mobilization/massage: OT providing massage in circular, side to side, and up/down direction around and over incision to increase the range of motion around the scar, especially thenar eminence.  OT educating on progressive pressure as healing continues and increased  massage at incision due to progress in healing.    11/17/24 Scar mobilization/massage: OT providing massage in circular, side to side, and up/down direction around incision to  increase the range of motion around the scar.  OT progressed to light pressure over incision in circular and up/down direction.  OT educating on progressive pressure as healing continues and increased massage at incision due to progress in healing.   Manual therapy: OT providing stretch and hold x5 for 5-10 seconds in to radial/ulnar deviation and wrist flexion/extension.  Pt with no c/o pain. Self-care: engaged in simulated picking up and carrying of plate to address arm and hand positioning to decrease over compensation of upper arm and shoulder.  Pt demonstrating improved control and ease with keeping arm in closer to body.  Also discussed potential benefit of use of RW with tray when transporting dishes/food for increased safety and not over compensating with shoulder elevation.   Therapeutic activity: engaged in stacking and unstacking cups with LUE with exaggerated radial and ulnar deviation, followed by rotation of cups to incorporate supination.  Pt again reporting initial overuse and compensation of shoulder movements, but with cues to attend to shoulder and relax pt with improved ROM. Modalities: educated on use of rice sock or heating pad as pt reporting that warm towel does not stay warm long enough.  Encouraged heat for 10 mins prior to ROM HEP.   11/10/24 Of note: pt's left hand and forearm blueish-purple, however once positioned with elbow propped on table typical color resumed.  Pt reporting that she has been doing a lot of walking with PT with arms down.  OT educating on elevating hand at rest for improved circulation.   ROM: engaged in active wrist flexion and extension as well as radial/ulnar deviation. OT then instructing pt in PROM for wrist flexion and extension with forearm resting on table top to allow  gravity to aid in further ROM, followed by slight pressure over top to facilitate increased ROM.  Pt completing each direction x10.  Strengthening: utilizing light resistance stress ball, pt completing full fist, 3 jaw chuck, and duck mouth x10 each.  OT educating on improved positioning for improved ROM and strengthening.   Educated on presence of pain with ROM, however encouraging pt to stop if pain present > 2/10 increase or if it lingers > 30-60 mins post activity.    PATIENT EDUCATION: Education details: massage, ROM, use  of heat Person educated: Patient Education method: Explanation, Demonstration, Verbal cues, and Handouts Education comprehension: verbalized understanding and needs further education  HOME EXERCISE PROGRAM: Carpal tunnel release HEP (see pt instructions)  Access Code: [email or web address] URL: [email or web address] Date: 11/10/2024 Prepared by: [facility] - Outpatient  Rehab - [facility]  Exercises - Putty Squeezes  - 2 x daily - 15 reps - 3-Point Pinch with Putty  - 2 x daily - 15 reps - Finger Lumbricals with Putty  - 2 x daily - 15 reps - Seated Wrist Extension Stretch  - 2 x daily - 5-10 reps - Seated Wrist Flexion Stretch  - 2 x daily - 5-10 reps  GOALS: Goals reviewed with patient? Yes  SHORT TERM GOALS: Target date: 11/19/24  Pt will be independent with HEP for ROM and strengthening. Baseline: new to OPOT Goal status: in progress   2.  Pt will verbalize understanding of task modifications and/or potential A/E needs to increase ease, safety, and independence w/ ADLs. Baseline: difficulty with donning compression socks d/t decreased grip and mobility of L wrist/hand Goal status: in progress   3.  Pt will demonstrate improvements in wrist flexion/extension and functional pinch to complete 9 hole peg test with LUE in <55 seconds. Baseline: 61 sec 11/22/24: 39.56 sec Goal status: MET  4.  Pt will verbalize understanding of re-sensitization  strategies as well as compensatory strategies due to impaired sensation.  Baseline: impaired sensation in hand at median nerve distribution  Goal status: in progress    LONG TERM GOALS: Target date: 12/08/24  Pt will demonstrate improved coordination of LUE as evidenced by completing 9 hole peg test in 45 seconds or less as needed for [facility] tasks with ADLs/IADLS. Baseline: 61 Goal status: in progress  2.  Pt will improve grip strength in left hand to be within at least 20 pounds of her right hand for functional use at home and in IADLs.  Baseline: L: 5# and R: 40# Goal status: in progress   3.  Pt will demonstrate improved L wrist flexion/extension to 75% of R wrist flexion/extension for improved functional use.  Baseline: R:75/60 and L: 34/17 Goal status: in progress   4.  Pt will demonstrate improved L forearm supination to 100% of R forearm supination for improved functional use. Baseline: 80% Goal status: in progress   5.  Pt will demonstrate improvements in pain and functional use of LUE as evidenced by decreased score on QuickDASH from 50% impairement to < 30 % impairment.  Baseline: 50% Goal status: in progress  6.  Pt will verbalize understanding of safe modifications and/or adaptive techniques to complete laundry tasks at Mod I level. Baseline: step-daughter is currently completing due to laundry room being in basement Goal status: in progress  ASSESSMENT:  CLINICAL IMPRESSION: Patient is a 89 y.o. female who was seen today for occupational therapy treatment s/p L carpal tunnel release. Pt is reporting decreased pain, however stiffness and diminished sensation persisting.  Pt reports completing functional tasks at home with use of LUE as gross to diminished assistance with recollection to relax at shoulder during movements.  Pt demonstrating good technique with active and passive ROM, still demonstrating decreased wrist flexion and radial deviation.  Pt will continue to  benefit from skilled OT services to address ROM, strengthening, and improved functional use of LUE.  PERFORMANCE DEFICITS: in functional skills including ADLs, IADLs, coordination, sensation, edema, ROM, strength, flexibility, Fine motor control, Gross motor control, body mechanics, endurance, decreased knowledge  of precautions, and UE functional use and psychosocial skills including routines and behaviors.      PLAN:  OT FREQUENCY: 1-2x/week  OT DURATION: 6 weeks  PLANNED INTERVENTIONS: 97168 OT Re-evaluation, 97535 self care/ADL training, 02889 therapeutic exercise, 97530 therapeutic activity, 97112 neuromuscular re-education, 97140 manual therapy, 97035 ultrasound, 97018 paraffin, 02239 Orthotic Initial, 97763 Orthotic/Prosthetic subsequent, passive range of motion, compression bandaging, coping strategies training, patient/family education, and DME and/or AE instructions   RECOMMENDED OTHER SERVICES: NA  CONSULTED AND AGREED WITH PLAN OF CARE: Patient  PLAN FOR NEXT SESSION: Review HEP, review scar massage, use of modalities for ROM and pain relief, after 4-6 weeks s/p sx can start light strengthening    Liisa Picone, OTR/L 11/22/2024, 11:15 AM  [facility] at [facility] [address] [place], [place] [address] Phone # [phone] Fax # [phone]

## 2024-11-23 ENCOUNTER — Ambulatory Visit: Admitting: Orthopedic Surgery

## 2024-11-23 NOTE — Progress Notes (Unsigned)
° °  Monica Curry - 89 y.o. female MRN [id number]  Date of birth: February 09, 1931  Office Visit Note: Visit Date: 11/23/2024 PCP: Wendolyn Jenkins Jansky, MD Referred by: Wendolyn Jenkins Jansky, MD  Subjective:  HPI: Monica Curry is a 89 y.o. female who presents today for follow up 6 weeks status post left wrist open carpal tunnel release.  Pertinent ROS were reviewed with the patient and found to be negative unless otherwise specified above in HPI.   Assessment & Plan: Visit Diagnoses: No diagnosis found.  Plan: ***  Follow-up: No follow-ups on file.   Meds & Orders: No orders of the defined types were placed in this encounter.  No orders of the defined types were placed in this encounter.    Procedures: No procedures performed       Objective:   Vital Signs: There were no vitals taken for this visit.  Ortho Exam ***  Imaging: No results found.   Clarion Mooneyhan Afton Alderton, M.D. [facility], Hand Surgery

## 2024-11-24 ENCOUNTER — Ambulatory Visit: Admitting: Physical Therapy

## 2024-11-24 ENCOUNTER — Encounter: Payer: Self-pay | Admitting: Physical Therapy

## 2024-11-24 ENCOUNTER — Ambulatory Visit: Admitting: Occupational Therapy

## 2024-11-24 DIAGNOSIS — M6281 Muscle weakness (generalized): Secondary | ICD-10-CM

## 2024-11-24 DIAGNOSIS — R208 Other disturbances of skin sensation: Secondary | ICD-10-CM

## 2024-11-24 DIAGNOSIS — R278 Other lack of coordination: Secondary | ICD-10-CM

## 2024-11-24 DIAGNOSIS — M25632 Stiffness of left wrist, not elsewhere classified: Secondary | ICD-10-CM

## 2024-11-24 DIAGNOSIS — R2681 Unsteadiness on feet: Secondary | ICD-10-CM

## 2024-11-24 DIAGNOSIS — R2689 Other abnormalities of gait and mobility: Secondary | ICD-10-CM

## 2024-11-24 NOTE — Therapy (Signed)
 OUTPATIENT OCCUPATIONAL THERAPY ORTHO Treatment Note  Patient Name: Monica Curry MRN: [id number] DOB:[birth year], 89 y.o., female Today's Date: 11/24/2024  PCP: Wendolyn Jenkins Jansky, MD  REFERRING PROVIDER: Arlinda Buster, MD  END OF SESSION:  OT End of Session - 11/24/24 1208     Visit Number 6    Number of Visits 11    Date for Recertification  12/08/24    Authorization Type [employer]    Authorization Time Period Auth#: [id number] , approved 11 OT visits from 10/27/2024 - 01/25/2025    Authorization - Visit Number 5    Authorization - Number of Visits 11    OT Start Time 1151    OT Stop Time 1233    OT Time Calculation (min) 42 min               Past Medical History:  Diagnosis Date   Allergy    Medications and pollen   Anemia    Anxiety    Always   C. difficile colitis    Cancer (HCC)    Endometrial cancer   Cataract    Had surgery 2021   Chronic kidney disease 2018   2 siblings had kidney failure, sister had transplant   Clotting disorder    Essential and other specified forms of tremor    Hyperlipidemia    Hypertension    Osteoporosis 11/2019   T score -2.8 stable from prior DEXA   Personal history of venous thrombosis and embolism 1997   Pulmonary embolism (HCC) 06/14/2021   Pulmonary embolus (HCC)    Shingles    Stroke (HCC) 2018   TIA   Thrush    TIA (transient ischemic attack)    carotid US  07/2022: no ICA stenosis bilaterally   Past Surgical History:  Procedure Laterality Date   ABDOMINAL HYSTERECTOMY  1997   APPENDECTOMY  1961   cataract surg Bilateral 2021   Dr. Roz, R eye 8/18, L 8/25   CESAREAN SECTION  1961   EYE SURGERY  2021   Cataract both eyes   FRACTURE SURGERY  2025   Broken hip   INTRAMEDULLARY (IM) NAIL INTERTROCHANTERIC Left 04/28/2024   Procedure: FIXATION, FRACTURE, INTERTROCHANTERIC, WITH INTRAMEDULLARY ROD;  Surgeon: Kendal Franky SQUIBB, MD;  Location: [facility] OR;  Service: Orthopedics;  Laterality: Left;    TONSILLECTOMY AND ADENOIDECTOMY  1944   TOTAL ABDOMINAL HYSTERECTOMY W/ BILATERAL SALPINGOOPHORECTOMY  1997   Patient Active Problem List   Diagnosis Date Noted   RLS (restless legs syndrome) 06/15/2024   Fracture, Colles, left, closed 04/28/2024   Hip fracture (HCC) 04/27/2024   GAD (generalized anxiety disorder) 04/01/2024   Thyroid  nodule 01/10/2022   Anxiety 08/10/2021   Aortic atherosclerosis (HCC) - CT 06/2021 06/15/2021   Bilateral renal cysts 07/06/2020   Paroxysmal supraventricular tachycardia 10/20/2019   History of skin cancer (basal cell) 06/16/2019   Complete right bundle branch block 06/16/2019   B12 deficiency 06/15/2019   History of TIA (transient ischemic attack) 10/23/2017   Chronic kidney disease, stage 3b (HCC) 07/21/2015   Medication management 07/04/2014   Essential hypertension 11/25/2013   Hyperlipidemia, mixed 11/25/2013   Abnormal glucose 11/25/2013   Vitamin D  deficiency 11/25/2013   Essential tremor 11/25/2013   History of endometrial cancer    Hx/o DVT/PE    Urethral prolapse    Osteoporosis 10/10/2011    ONSET DATE: referral date 10/07/24 (injury 04/27/24 and [facility] sx 10/12/24)  REFERRING DIAG: G56.02 (ICD-10-CM) - Carpal tunnel syndrome, left upper limb  THERAPY DIAG:  Muscle weakness (generalized)  Stiffness of left wrist, not elsewhere classified  Other lack of coordination  Other disturbances of skin sensation  Rationale for Evaluation and Treatment: Rehabilitation  SUBJECTIVE:   SUBJECTIVE STATEMENT: Pt reporting that she saw Dr. Erwin yesterday and he said no further surgery is indicated but to allow time for the body to respond to the released nerve.    Pt accompanied by: self  PERTINENT HISTORY:   Per MD note 10/26/24: 89 y.o. female who presents today for follow up 2 weeks status post left wrist open carpal tunnel release.  Doing well overall, pain controlled, does have persistent numbness and tingling.  Left hand: -  Well-healing palmar incision, sutures removed, skin edges well-approximated without erythema or drainage - Composite fist limited secondary to stiffness, improved passively - Sensation remains diminished to light touch in the median nerve distribution - 4/5 APB mild thenar atrophy, thumb opposition to ring finger PIP  PRECAUTIONS: Fall  WEIGHT BEARING RESTRICTIONS: No  PAIN:  Are you having pain? Yes: NPRS scale: minimal Pain location: palm of hand Pain description: twinge Aggravating factors: touching hand around incision Relieving factors: being still  FALLS: Has patient fallen in last 6 months? Yes. Number of falls 1 - fall in May that resulted in hip and wrist fractures.  LIVING ENVIRONMENT: Lives with: lives alone Lives in: House/apartment Stairs: 3 steps to enter, flight of steps to the basement but she does not go down them Has following equipment at home: [name] - 2 wheeled, single point cane, shower chair, and Grab bars  PLOF: Independent with basic ADLs and Requires assistive device for independence; step-daughter is currently doing laundry at her home as pt's washer/dryer are in the basement  PATIENT GOALS: to be able to use Left hand again   NEXT MD VISIT: 11/23/24  OBJECTIVE:  Note: Objective measures were completed at Evaluation unless otherwise noted.  HAND DOMINANCE: Right  ADLs: WFL; difficulty with donning compression sock on L leg but is able to don  FUNCTIONAL OUTCOME MEASURES: Quick Dash: 50%   UPPER EXTREMITY ROM:     Active ROM Right eval Left eval Left 11/22/24  Shoulder flexion     Shoulder abduction     Shoulder adduction     Shoulder extension     Shoulder internal rotation     Shoulder external rotation     Elbow flexion     Elbow extension     Wrist flexion 75 34 30  Wrist extension 60 17 40  Wrist ulnar deviation 30 18 22   Wrist radial deviation 12 10 8   Wrist pronation  WFL   Wrist supination  80%   (Blank rows = not  tested)  Loose grasp on L, however all fingers are able to touch palm of hand lightly in open fist, decreased full fist, hook fist, and table top  HAND FUNCTION: Not assessed due to stitches just removed yesterday.  On eval prior to surgery 08/30/24: Right: 40 lbs; Left: 5 lbs  COORDINATION: 9 Hole Peg test: Right: 30.13 sec; Left: 61.75 sec; Mild B tremors  SENSATION: Numbness and tingling in R hand and digits along median nerve distribution  EDEMA: NA  COGNITION: Overall cognitive status: Within functional limits for tasks assessed   OBSERVATIONS: Pt L wrist at ulnar head and dorsal wrist enlarged compared to R wrist and hand due to bone deformity at dorsal wrist and ulnar styloid s/p setting of fracture back in May 2025. Pt demonstrating ~  50% movement in all directions when compared to R, but verbalizing good understanding of exercises.    TREATMENT DATE:  11/24/24 Wrist ROM: engaged in wrist flexion/extension followed by radial/ulnar deviation with rolling weighted ball on table top.  OT providing verbal cues for technique to ensure proper technique to facilitate ROM.  Progressed to flexion/extension with 1# dumbbell with forearm braced on table top with hand over edge to further facilitate increased active ROM.  Completing x10 in each direction. Transitioned to circumduction clockwise and counter-clockwise without weight.  Pt demonstrating decreased motor control when rotating into flexion and radial deviation both directions.  Therapeutic activity: engaged in card sorting and matching by suit with focus on picking up and flipping cards to facilitate wrist flexion and extension during functional task.  Pt demonstrating decreased ROM with supination and ulnar deviation during flipping cards.   11/22/24 Heat: applied moist heat to L wrist in preparation for ROM exercises.  OT reiterating education on checking skin prior to and post heat for skin integrity.  Pt reporting that she has  been thinking about relaxing her shoulder when she is cooking/baking or transporting items with good success.  Discussed scheduling with approved visits and remaining certification, adding visits through 12/10/24 (plan to assess goals on 12/07/24 and either d/c or re-cert). ROM: pt completing active wrist flexion/extension and ulnar/radial deviation with hold at end range x5 seconds, completing each exercise x10.  Completed tendon glides with table top, flat fist, and hook fist.  OT encouraging over pressure during flat fist and hook fist to attempt increased PROM.   Measurements taken: see above Scar mobilization/massage: OT providing massage in circular, side to side, and up/down direction around and over incision to increase the range of motion around the scar, especially thenar eminence.  OT educating on progressive pressure as healing continues and increased massage at incision due to progress in healing.    11/17/24 Scar mobilization/massage: OT providing massage in circular, side to side, and up/down direction around incision to  increase the range of motion around the scar.  OT progressed to light pressure over incision in circular and up/down direction.  OT educating on progressive pressure as healing continues and increased massage at incision due to progress in healing.   Manual therapy: OT providing stretch and hold x5 for 5-10 seconds in to radial/ulnar deviation and wrist flexion/extension.  Pt with no c/o pain. Self-care: engaged in simulated picking up and carrying of plate to address arm and hand positioning to decrease over compensation of upper arm and shoulder.  Pt demonstrating improved control and ease with keeping arm in closer to body.  Also discussed potential benefit of use of RW with tray when transporting dishes/food for increased safety and not over compensating with shoulder elevation.   Therapeutic activity: engaged in stacking and unstacking cups with LUE with exaggerated  radial and ulnar deviation, followed by rotation of cups to incorporate supination.  Pt again reporting initial overuse and compensation of shoulder movements, but with cues to attend to shoulder and relax pt with improved ROM. Modalities: educated on use of rice sock or heating pad as pt reporting that warm towel does not stay warm long enough.  Encouraged heat for 10 mins prior to ROM HEP.   PATIENT EDUCATION: Education details: massage, ROM, use of heat Person educated: Patient Education method: Explanation, Demonstration, Verbal cues, and Handouts Education comprehension: verbalized understanding and needs further education  HOME EXERCISE PROGRAM: Carpal tunnel release HEP (see pt instructions)  Access  Code: [facility] URL: [email or web address] Date: 11/10/2024 Prepared by: [facility] - Outpatient  Rehab - [facility]  Exercises - Putty Squeezes  - 2 x daily - 15 reps - 3-Point Pinch with Putty  - 2 x daily - 15 reps - Finger Lumbricals with Putty  - 2 x daily - 15 reps - Seated Wrist Extension Stretch  - 2 x daily - 5-10 reps - Seated Wrist Flexion Stretch  - 2 x daily - 5-10 reps  GOALS: Goals reviewed with patient? Yes  SHORT TERM GOALS: Target date: 11/19/24  Pt will be independent with HEP for ROM and strengthening. Baseline: new to OPOT Goal status: in progress   2.  Pt will verbalize understanding of task modifications and/or potential A/E needs to increase ease, safety, and independence w/ ADLs. Baseline: difficulty with donning compression socks d/t decreased grip and mobility of L wrist/hand Goal status: in progress   3.  Pt will demonstrate improvements in wrist flexion/extension and functional pinch to complete 9 hole peg test with LUE in <55 seconds. Baseline: 61 sec 11/22/24: 39.56 sec Goal status: MET  4.  Pt will verbalize understanding of re-sensitization strategies as well as compensatory strategies due to impaired  sensation.  Baseline: impaired sensation in hand at median nerve distribution  Goal status: in progress    LONG TERM GOALS: Target date: 12/08/24  Pt will demonstrate improved coordination of LUE as evidenced by completing 9 hole peg test in 45 seconds or less as needed for [facility] tasks with ADLs/IADLS. Baseline: 61 Goal status: in progress  2.  Pt will improve grip strength in left hand to be within at least 20 pounds of her right hand for functional use at home and in IADLs.  Baseline: L: 5# and R: 40# Goal status: in progress   3.  Pt will demonstrate improved L wrist flexion/extension to 75% of R wrist flexion/extension for improved functional use.  Baseline: R:75/60 and L: 34/17 Goal status: in progress   4.  Pt will demonstrate improved L forearm supination to 100% of R forearm supination for improved functional use. Baseline: 80% Goal status: in progress   5.  Pt will demonstrate improvements in pain and functional use of LUE as evidenced by decreased score on QuickDASH from 50% impairement to < 30 % impairment.  Baseline: 50% Goal status: in progress  6.  Pt will verbalize understanding of safe modifications and/or adaptive techniques to complete laundry tasks at Mod I level. Baseline: step-daughter is currently completing due to laundry room being in basement Goal status: in progress  ASSESSMENT:  CLINICAL IMPRESSION: Patient is a 89 y.o. female who was seen today for occupational therapy treatment s/p L carpal tunnel release. Pt is reporting decreased pain, however stiffness and diminished sensation persisting.  Pt demonstrating good tolerance to increased resistance and incorporation of 1# dumbbell with flexion/extension.  Pt still demonstrating decreased extension and coordination when moving in and out of flexion. Pt will continue to benefit from skilled OT services to address ROM, strengthening, and improved functional use of LUE.  PERFORMANCE DEFICITS: in functional  skills including ADLs, IADLs, coordination, sensation, edema, ROM, strength, flexibility, Fine motor control, Gross motor control, body mechanics, endurance, decreased knowledge of precautions, and UE functional use and psychosocial skills including routines and behaviors.      PLAN:  OT FREQUENCY: 1-2x/week  OT DURATION: 6 weeks  PLANNED INTERVENTIONS: 97168 OT Re-evaluation, 97535 self care/ADL training, 02889 therapeutic exercise, 97530 therapeutic activity, 97112 neuromuscular re-education, 97140  manual therapy, 97035 ultrasound, 02981 paraffin, 97760 Orthotic Initial, [id number] Orthotic/Prosthetic subsequent, passive range of motion, compression bandaging, coping strategies training, patient/family education, and DME and/or AE instructions   RECOMMENDED OTHER SERVICES: NA  CONSULTED AND AGREED WITH PLAN OF CARE: Patient  PLAN FOR NEXT SESSION: Review HEP, review scar massage, use of modalities for ROM and pain relief, after 4-6 weeks s/p sx can start light strengthening    Keyonni Percival, OTR/L 11/24/2024, 12:09 PM  [facility] at [facility] [address] [place], [place] [address] Phone # [phone] Fax # [phone]

## 2024-11-24 NOTE — Therapy (Signed)
 OUTPATIENT PHYSICAL THERAPY NEURO TREATMENT NOTE/10th VISIT PROGRESS NOTE  Patient Name: Monica Curry MRN: [id number] DOB:02-Jan-1931, 89 y.o., female Today's Date: 11/24/2024   PCP: Wendolyn Jenkins Jansky, MD REFERRING PROVIDER: Danton Lauraine LABOR, PA-C   Progress Note Reporting Period 10/14/2024 to 11/24/2024  See note below for Objective Data and Assessment of Progress/Goals.       END OF SESSION:  PT End of Session - 11/24/24 1105     Visit Number 20    Number of Visits 26    Date for Recertification  12/10/24    Authorization Type [employer]-reauth submitted    Authorization Time Period 12 visits approved from 11/24 to 12/10/24    Authorization - Visit Number 6    Authorization - Number of Visits 12    Progress Note Due on Visit 20    PT Start Time 1104    Equipment Utilized During Treatment Gait belt    Activity Tolerance Patient tolerated treatment well    Behavior During Therapy [facility] for tasks assessed/performed                        Past Medical History:  Diagnosis Date   Allergy    Medications and pollen   Anemia    Anxiety    Always   C. difficile colitis    Cancer (HCC)    Endometrial cancer   Cataract    Had surgery 2021   Chronic kidney disease 2018   2 siblings had kidney failure, sister had transplant   Clotting disorder    Essential and other specified forms of tremor    Hyperlipidemia    Hypertension    Osteoporosis 11/2019   T score -2.8 stable from prior DEXA   Personal history of venous thrombosis and embolism 1997   Pulmonary embolism (HCC) 06/14/2021   Pulmonary embolus (HCC)    Shingles    Stroke (HCC) 2018   TIA   Thrush    TIA (transient ischemic attack)    carotid US  07/2022: no ICA stenosis bilaterally   Past Surgical History:  Procedure Laterality Date   ABDOMINAL HYSTERECTOMY  1997   APPENDECTOMY  1961   cataract surg Bilateral 2021   Dr. Roz, R eye 8/18, L 8/25   CESAREAN SECTION  1961   EYE SURGERY   2021   Cataract both eyes   FRACTURE SURGERY  2025   Broken hip   INTRAMEDULLARY (IM) NAIL INTERTROCHANTERIC Left 04/28/2024   Procedure: FIXATION, FRACTURE, INTERTROCHANTERIC, WITH INTRAMEDULLARY ROD;  Surgeon: Kendal Franky SQUIBB, MD;  Location: [facility] OR;  Service: Orthopedics;  Laterality: Left;   TONSILLECTOMY AND ADENOIDECTOMY  1944   TOTAL ABDOMINAL HYSTERECTOMY W/ BILATERAL SALPINGOOPHORECTOMY  1997   Patient Active Problem List   Diagnosis Date Noted   RLS (restless legs syndrome) 06/15/2024   Fracture, [place], left, closed 04/28/2024   Hip fracture (HCC) 04/27/2024   GAD (generalized anxiety disorder) 04/01/2024   Thyroid  nodule 01/10/2022   Anxiety 08/10/2021   Aortic atherosclerosis (HCC) - CT 06/2021 06/15/2021   Bilateral renal cysts 07/06/2020   Paroxysmal supraventricular tachycardia 10/20/2019   History of skin cancer (basal cell) 06/16/2019   Complete right bundle branch block 06/16/2019   B12 deficiency 06/15/2019   History of TIA (transient ischemic attack) 10/23/2017   Chronic kidney disease, stage 3b (HCC) 07/21/2015   Medication management 07/04/2014   Essential hypertension 11/25/2013   Hyperlipidemia, mixed 11/25/2013   Abnormal glucose 11/25/2013   Vitamin  D deficiency 11/25/2013   Essential tremor 11/25/2013   History of endometrial cancer    Hx/o DVT/PE    Urethral prolapse    Osteoporosis 10/10/2011    ONSET DATE: 04/28/2024  REFERRING DIAG: S/P L intertrachanteric femur fracture, Left distal radius fracture 04/28/2024   THERAPY DIAG:  Muscle weakness (generalized)  Other abnormalities of gait and mobility  Unsteadiness on feet  Rationale for Evaluation and Treatment: Rehabilitation  SUBJECTIVE:                                                                                                                                                                                             SUBJECTIVE STATEMENT: Not much new.  A little soreness after the  exercises, but went away.  Feel good that I'm walking away from the cane more, in the kitchen especially.  Still use the walker overnight.  Want to be more confident in walking, especially in the house.  Pt accompanied by: self  PERTINENT HISTORY: osteoporosis, hx of DVT; see additional PMH above  PAIN:  Are you having pain? No   PRECAUTIONS: Fall, wears L compression stocking due to L LE edema (was negative for DVT)  **CTR surgery LUE 10/12/24:  wearing bandage for 5 days, no lifting, to return to MD 11/18 for removal of stitches, follow up  RED FLAGS: None   WEIGHT BEARING RESTRICTIONS: No  FALLS: Has patient fallen in last 6 months? Yes. Number of falls 1  LIVING ENVIRONMENT: Lives with: lives alone Lives in: House/apartment Stairs: 3 steps to enter home;  Has following equipment at home: Single point cane and Walker - 2 wheeled *cane has tripod base and has gotten quad tip (per 10/28 visit) PLOF: Independent  PATIENT GOALS: To be able to transition to the cane, so I can get in and out of home better.  OBJECTIVE:    TODAY'S TREATMENT: 11/24/2024 Activity Comments  FTSTS:  13.72 sec arms across chest Improved from 16.16 sec;  not a full stand last rep  Gait velocity: 13.25 sec 13.22 sec No cane 2.48 ft/sec, with cues for longer strides  TUG:  13.38 sec   Gait simulating household distance, 65 ft x 2 reps; 2 sets Supervision, mod I  Sit to stand x 5 reps with initiation of gait/turn/return to sit With cognitive dual task; cues for full upright posture/glut activation upon standing  SLS with foot hovering over step Unsteadiness without UE support-cues for very light UE/fingertip support to improve stability, glut activation       HOME EXERCISE PROGRAM: Access Code: [email or web address] URL: [email or web address] Date: 11/01/2024 Prepared by: [facility] - Outpatient  Rehab - [facility]  Program Notes Marching in place, side hip kicks, back hip kicks:  Do 3  sets of 10 with 2# weight  Exercises - Backward Walking with Counter Support  - 1 x daily - 5 x weekly - 1 sets - 5 reps - Alternating Step Taps with Counter Support  - 1 x daily - 5 x weekly - 3 sets - 10 reps - Side Stepping with Counter Support  - 1 x daily - 5 x weekly - 1 sets - 5 reps - Corner Balance Feet Together With Eyes Closed  - 1 x daily - 7 x weekly - 3 reps - 30 sec hold - Corner Balance Feet Together: Eyes Open With Head Turns  - 1 x daily - 7 x weekly - 3 reps - 30 sec hold - Corner Balance Feet Together: Eyes Closed With Head Turns  - 1 x daily - 7 x weekly - 3 reps - 30 sec hold - Semi-Tandem Corner Balance With Eyes Open  - 1 x daily - 7 x weekly - 3 reps - 15-30 sec hold - Supine Bridge  - 1 x daily - 3-4 x weekly - 3 sets - 10 reps - Hooklying Isometric Clamshell  - 1 x daily - 3-4 x weekly - 3 sets - 10 reps - Sit to Stand with Resistance Around Legs  - 1 x daily - 4 x weekly - 2 sets - 10 reps - Seated Hamstring Stretch  - 1 x daily - 7 x weekly - 1 sets - 3 reps - 30 sec hold - Sidelying Hip Abduction  - 1 x daily - 2-3 x weekly - 3 sets - 10 reps       PATIENT EDUCATION: Education details: 11/24/2024:  Practice with gait at home without device:  loop of rooms, 2-3 reps with LONG, EQUAL, EVEN STRIDES; full upright posture upon standing to allow for confident first step Person educated: Patient Education method: Explanation, Demonstration, and Verbal cues Education comprehension: verbalized understanding and returned demonstration      -------------------------------------------- Note: Objective measures were completed at Evaluation unless otherwise noted.  DIAGNOSTIC FINDINGS: Interval proximal left femoral cephalomedullary nail fixation of the previously seen intertrochanteric fracture. Improved alignment.  COGNITION: Overall cognitive status: Within functional limits for tasks assessed   SENSATION: Reports decreased sensation in L fingers (to  see hand surgeon)  EDEMA:  Noted LLE; wearing compression stocking  POSTURE: rounded shoulders and increased thoracic kyphosis  LOWER EXTREMITY ROM:     Active  Right Eval Left Eval  Hip flexion    Hip extension    Hip abduction    Hip adduction    Hip internal rotation    Hip external rotation    Knee flexion    Knee extension    Ankle dorsiflexion  10  Ankle plantarflexion  40  Ankle inversion    Ankle eversion     (Blank rows = not tested)  LOWER EXTREMITY MMT:    MMT Right Eval Left Eval  Hip flexion 4 3+  Hip extension    Hip abduction 5 4  Hip adduction 5 4  Hip internal rotation    Hip external rotation    Knee flexion 4+ 4+  Knee extension 4+ 4  Ankle dorsiflexion 4+ 4  Ankle plantarflexion    Ankle inversion    Ankle eversion    (Blank rows = not tested)  TRANSFERS: Sit to stand: Modified independence  Assistive device  utilized: None     Stand to sit: Modified independence  Assistive device utilized: hands at chair      STAIRS: Findings: Level of Assistance: Modified independence, Stair Negotiation Technique: Step to Pattern with Single Rail on Right, Number of Stairs: 2-3, Height of Stairs: 4-6   , and Comments: prior to fracture, pt negotiated steps alternating pattern GAIT: Findings: Gait Characteristics: step through pattern, decreased step length- Right, and decreased stance time- Left, Distance walked: 50 ft x 2, Assistive device utilized:Walker - 2 wheeled and SPC with small quad tip base, Level of assistance: Modified independence, CGA, and Min A, and Comments: Mod I with RW, CGA/min assist with cane  FUNCTIONAL TESTS:  10 meter walk test: 16.78 sec RW (1.95 ft/sec); 33.62 sec with cane (0.98 ft/sec) Berg:  34/56 (Scores <45/56 indicate increased fall risk)                                                                                                                                 TREATMENT DATE: 08/30/2024    PATIENT  EDUCATION: Education details: Eval results, POC, continue HEP from HHPT; verbally added lateral weightshifting to LLE, with attention to light UE support Person educated: Patient Education method: Explanation, Demonstration, and Verbal cues Education comprehension: verbalized understanding, returned demonstration, and needs further education  HOME EXERCISE PROGRAM: Verbally initiated lateral weightshifting; pt doing standing hip kicks, heel/toe raises, marching and sit to stand at home  GOALS: Goals reviewed with patient? Yes  SHORT TERM GOALS: Target date: 10/01/2024  Pt will be independent with HEP for improved gait, stair negotiation, balance. Baseline: no concerns on HEP  09/29/24 Goal status: IN PROGRESS 09/29/24  2.  Pt will improve Berg score to at least 45/56 to decrease fall risk. Baseline: 34/56>44/56 09/27/2024 Goal status: PARTIALLY MET 09/27/2024, MET 10/27/2024  3.  Pt will negotiate curb and 3 steps with cane, mod I, for improved household and community gait. Baseline: stairs with cane with CGA  09/29/24 Goal status: MET, 10/27/2024   LONG TERM GOALS: Target date: 10/29/2024>12/10/2024  Pt will be independent with HEP for improved balance, strength, gait, stairs. Baseline: continuing to update Goal status: IN PROGRESS, 10/27/2024  2.  Pt will improve DGI score to at least 20/24 to decrease fall risk. Baseline: 11/24>16/24 Goal status:IN PROGRESS, 11/19/205  3.  Pt will improve gait velocity to at least 2.6 ft/sec with cane for improved gait efficiency and safety. Baseline: 0.98 ft/sec>2.1 ft/sec with cane 10/27/2024 Goal status: IN PROGRESS, 10/27/2024  4.  Pt will ambulate at least 500 ft, indoor and outdoor gait, mod I with cane for improved community mobility. Baseline: community distances with daughter, per report Goal status: MET, 10/27/2024  5.  Pt will improve TUG score to less than or equal to 13.5 sec without device, for decreased fall  risk.  Baseline:  TUG no cane 17.13 sec 10/27/24  Goal status:  INITIAL  6.  Pt  will improve TUG manual score to less than or equal to 15 sec for decreased fall risk.  Baseline:  19.87 sec 10/27/24  Goal status:  INITIAL  7.  Pt will improve gait velocity *with no cane* to at least 2.3 ft/sec for improved gait efficiency and safety.  Baseline:  1.82 ft/sec 10/27/24  Goal status:  INITIAL  8.  Pt will improve 5x sit<>stand to less than or equal to 14.5 sec to demonstrate improved functional strength and transfer efficiency.  Baseline:  16.16 sec 10/27/24  Goal status:  INITIAL    ASSESSMENT:  CLINICAL IMPRESSION: Pt presents today with reports of continued overall improvements, though she notes still decreased confidence with household gait without device.  With gait and balance testing, PT notes that pt is taking overall smaller, more hesitant steps, antalgic pattern on LLE.  With cues for equal, even step length, pt is able to improve her posture, arm swing with gait and appears more stable and confident.  Worked throughout session today without device, with reinforcement cues for optimal gait and transfers.  Gait velocity 2.48 ft/sec, TUG score 13.38 sec and FTSTS 13.72 sec are all improved from last measures; will continue to work to make sure she is able to consistently keep these measures at this level.  She will continue to benefit from Skilled PT towards goals for improved functional mobility and decreased fall risk.  OBJECTIVE IMPAIRMENTS: Abnormal gait, decreased balance, decreased mobility, difficulty walking, and decreased strength.   ACTIVITY LIMITATIONS: standing, stairs, transfers, and locomotion level  PARTICIPATION LIMITATIONS: meal prep, cleaning, driving, shopping, community activity, yard work, and church  PERSONAL FACTORS: 3+ comorbidities: see PMH above are also affecting patient's functional outcome.   REHAB POTENTIAL: Good  CLINICAL DECISION MAKING:  Evolving/moderate complexity  EVALUATION COMPLEXITY: Moderate  PLAN:  PT FREQUENCY: 2x/week  PT DURATION: 6 weeks per recert 10/27/2024  PLANNED INTERVENTIONS: 97750- Physical Performance Testing, 97110-Therapeutic exercises, 97530- Therapeutic activity, 97112- Neuromuscular re-education, 97535- Self Care, [id number]- Manual therapy, [phone]- Gait training, Patient/Family education, Balance training, Stair training, and DME instructions  PLAN FOR NEXT SESSION:  How did walking practice go at home without device?  SLS, glut activation in standing.  Continue weight progression/resistance with standing exercises at counter.  Gait training and balance training in clinic no device as appropriate.    Greig Anon, PT 11/24/2024 11:05 AM Phone: [phone] Fax: [phone]  [facility] [address] [place], [address] [place], [place] [address] Phone # [phone] Fax # [phone]

## 2024-11-25 NOTE — Therapy (Signed)
 OUTPATIENT PHYSICAL THERAPY NEURO TREATMENT NOTE  Patient Name: Monica Curry MRN: [id number] DOB:January 07, 1931, 89 y.o., female Today's Date: 11/25/2024   PCP: Wendolyn Jenkins Jansky, MD REFERRING PROVIDER: Danton Lauraine LABOR, PA-C       END OF SESSION:                  Past Medical History:  Diagnosis Date   Allergy    Medications and pollen   Anemia    Anxiety    Always   C. difficile colitis    Cancer [facility])    Endometrial cancer   Cataract    Had surgery 2021   Chronic kidney disease 2018   2 siblings had kidney failure, sister had transplant   Clotting disorder    Essential and other specified forms of tremor    Hyperlipidemia    Hypertension    Osteoporosis 11/2019   T score -2.8 stable from prior DEXA   Personal history of venous thrombosis and embolism 1997   Pulmonary embolism (HCC) 06/14/2021   Pulmonary embolus (HCC)    Shingles    Stroke (HCC) 2018   TIA   Thrush    TIA (transient ischemic attack)    carotid US  07/2022: no ICA stenosis bilaterally   Past Surgical History:  Procedure Laterality Date   ABDOMINAL HYSTERECTOMY  1997   APPENDECTOMY  1961   cataract surg Bilateral 2021   Dr. Roz, R eye 8/18, L 8/25   CESAREAN SECTION  1961   EYE SURGERY  2021   Cataract both eyes   FRACTURE SURGERY  2025   Broken hip   INTRAMEDULLARY (IM) NAIL INTERTROCHANTERIC Left 04/28/2024   Procedure: FIXATION, FRACTURE, INTERTROCHANTERIC, WITH INTRAMEDULLARY ROD;  Surgeon: Kendal Franky SQUIBB, MD;  Location: [facility] OR;  Service: Orthopedics;  Laterality: Left;   TONSILLECTOMY AND ADENOIDECTOMY  1944   TOTAL ABDOMINAL HYSTERECTOMY W/ BILATERAL SALPINGOOPHORECTOMY  1997   Patient Active Problem List   Diagnosis Date Noted   RLS (restless legs syndrome) 06/15/2024   Fracture, [place], left, closed 04/28/2024   Hip fracture (HCC) 04/27/2024   GAD (generalized anxiety disorder) 04/01/2024   Thyroid  nodule 01/10/2022   Anxiety 08/10/2021   Aortic  atherosclerosis (HCC) - CT 06/2021 06/15/2021   Bilateral renal cysts 07/06/2020   Paroxysmal supraventricular tachycardia 10/20/2019   History of skin cancer (basal cell) 06/16/2019   Complete right bundle branch block 06/16/2019   B12 deficiency 06/15/2019   History of TIA (transient ischemic attack) 10/23/2017   Chronic kidney disease, stage 3b (HCC) 07/21/2015   Medication management 07/04/2014   Essential hypertension 11/25/2013   Hyperlipidemia, mixed 11/25/2013   Abnormal glucose 11/25/2013   Vitamin D  deficiency 11/25/2013   Essential tremor 11/25/2013   History of endometrial cancer    Hx/o DVT/PE    Urethral prolapse    Osteoporosis 10/10/2011    ONSET DATE: 04/28/2024  REFERRING DIAG: S/P L intertrachanteric femur fracture, Left distal radius fracture 04/28/2024   THERAPY DIAG:  No diagnosis found.  Rationale for Evaluation and Treatment: Rehabilitation  SUBJECTIVE:  SUBJECTIVE STATEMENT: Not much new.  A little soreness after the exercises, but went away.  Feel good that I'm walking away from the cane more, in the kitchen especially.  Still use the walker overnight.  Want to be more confident in walking, especially in the house.  Pt accompanied by: self  PERTINENT HISTORY: osteoporosis, hx of DVT; see additional PMH above  PAIN:  Are you having pain? No   PRECAUTIONS: Fall, wears L compression stocking due to L LE edema (was negative for DVT)  **CTR surgery LUE 10/12/24:  wearing bandage for 5 days, no lifting, to return to MD 11/18 for removal of stitches, follow up  RED FLAGS: None   WEIGHT BEARING RESTRICTIONS: No  FALLS: Has patient fallen in last 6 months? Yes. Number of falls 1  LIVING ENVIRONMENT: Lives with: lives alone Lives in: House/apartment Stairs: 3 steps  to enter home;  Has following equipment at home: Single point cane and Walker - 2 wheeled *cane has tripod base and has gotten quad tip (per 10/28 visit) PLOF: Independent  PATIENT GOALS: To be able to transition to the cane, so I can get in and out of home better.  OBJECTIVE:     TODAY'S TREATMENT: 11/29/24 Activity Comments                       TODAY'S TREATMENT: 11/24/2024 Activity Comments  FTSTS:  13.72 sec arms across chest Improved from 16.16 sec;  not a full stand last rep  Gait velocity: 13.25 sec 13.22 sec No cane 2.48 ft/sec, with cues for longer strides  TUG:  13.38 sec   Gait simulating household distance, 65 ft x 2 reps; 2 sets Supervision, mod I  Sit to stand x 5 reps with initiation of gait/turn/return to sit With cognitive dual task; cues for full upright posture/glut activation upon standing  SLS with foot hovering over step Unsteadiness without UE support-cues for very light UE/fingertip support to improve stability, glut activation       HOME EXERCISE PROGRAM: Access Code: [email or web address] URL: [email or web address] Date: 11/01/2024 Prepared by: [facility]  Program Notes Marching in place, side hip kicks, back hip kicks:  Do 3 sets of 10 with 2# weight  Exercises - Backward Walking with Counter Support  - 1 x daily - 5 x weekly - 1 sets - 5 reps - Alternating Step Taps with Counter Support  - 1 x daily - 5 x weekly - 3 sets - 10 reps - Side Stepping with Counter Support  - 1 x daily - 5 x weekly - 1 sets - 5 reps - Corner Balance Feet Together With Eyes Closed  - 1 x daily - 7 x weekly - 3 reps - 30 sec hold - Corner Balance Feet Together: Eyes Open With Head Turns  - 1 x daily - 7 x weekly - 3 reps - 30 sec hold - Corner Balance Feet Together: Eyes Closed With Head Turns  - 1 x daily - 7 x weekly - 3 reps - 30 sec hold - Semi-Tandem Corner Balance With Eyes Open  - 1 x daily - 7 x weekly - 3 reps -  15-30 sec hold - Supine Bridge  - 1 x daily - 3-4 x weekly - 3 sets - 10 reps - Hooklying Isometric Clamshell  - 1 x daily - 3-4 x weekly - 3 sets - 10 reps - Sit to Stand with Resistance  Around Legs  - 1 x daily - 4 x weekly - 2 sets - 10 reps - Seated Hamstring Stretch  - 1 x daily - 7 x weekly - 1 sets - 3 reps - 30 sec hold - Sidelying Hip Abduction  - 1 x daily - 2-3 x weekly - 3 sets - 10 reps       PATIENT EDUCATION: Education details: 11/24/2024:  Practice with gait at home without device:  loop of rooms, 2-3 reps with LONG, EQUAL, EVEN STRIDES; full upright posture upon standing to allow for confident first step Person educated: Patient Education method: Explanation, Demonstration, and Verbal cues Education comprehension: verbalized understanding and returned demonstration      -------------------------------------------- Note: Objective measures were completed at Evaluation unless otherwise noted.  DIAGNOSTIC FINDINGS: Interval proximal left femoral cephalomedullary nail fixation of the previously seen intertrochanteric fracture. Improved alignment.  COGNITION: Overall cognitive status: Within functional limits for tasks assessed   SENSATION: Reports decreased sensation in L fingers (to see hand surgeon)  EDEMA:  Noted LLE; wearing compression stocking  POSTURE: rounded shoulders and increased thoracic kyphosis  LOWER EXTREMITY ROM:     Active  Right Eval Left Eval  Hip flexion    Hip extension    Hip abduction    Hip adduction    Hip internal rotation    Hip external rotation    Knee flexion    Knee extension    Ankle dorsiflexion  10  Ankle plantarflexion  40  Ankle inversion    Ankle eversion     (Blank rows = not tested)  LOWER EXTREMITY MMT:    MMT Right Eval Left Eval  Hip flexion 4 3+  Hip extension    Hip abduction 5 4  Hip adduction 5 4  Hip internal rotation    Hip external rotation    Knee flexion 4+ 4+  Knee extension 4+  4  Ankle dorsiflexion 4+ 4  Ankle plantarflexion    Ankle inversion    Ankle eversion    (Blank rows = not tested)  TRANSFERS: Sit to stand: Modified independence  Assistive device utilized: None     Stand to sit: Modified independence  Assistive device utilized: hands at chair      STAIRS: Findings: Level of Assistance: Modified independence, Stair Negotiation Technique: Step to Pattern with Single Rail on Right, Number of Stairs: 2-3, Height of Stairs: 4-6   , and Comments: prior to fracture, pt negotiated steps alternating pattern GAIT: Findings: Gait Characteristics: step through pattern, decreased step length- Right, and decreased stance time- Left, Distance walked: 50 ft x 2, Assistive device utilized:Walker - 2 wheeled and SPC with small quad tip base, Level of assistance: Modified independence, CGA, and Min A, and Comments: Mod I with RW, CGA/min assist with cane  FUNCTIONAL TESTS:  10 meter walk test: 16.78 sec RW (1.95 ft/sec); 33.62 sec with cane (0.98 ft/sec) Berg:  34/56 (Scores <45/56 indicate increased fall risk)  TREATMENT DATE: 08/30/2024    PATIENT EDUCATION: Education details: Eval results, POC, continue HEP from HHPT; verbally added lateral weightshifting to LLE, with attention to light UE support Person educated: Patient Education method: Explanation, Demonstration, and Verbal cues Education comprehension: verbalized understanding, returned demonstration, and needs further education  HOME EXERCISE PROGRAM: Verbally initiated lateral weightshifting; pt doing standing hip kicks, heel/toe raises, marching and sit to stand at home  GOALS: Goals reviewed with patient? Yes  SHORT TERM GOALS: Target date: 10/01/2024  Pt will be independent with HEP for improved gait, stair negotiation, balance. Baseline: no concerns on HEP   09/29/24 Goal status: IN PROGRESS 09/29/24  2.  Pt will improve Berg score to at least 45/56 to decrease fall risk. Baseline: 34/56>44/56 09/27/2024 Goal status: PARTIALLY MET 09/27/2024, MET 10/27/2024  3.  Pt will negotiate curb and 3 steps with cane, mod I, for improved household and community gait. Baseline: stairs with cane with CGA  09/29/24 Goal status: MET, 10/27/2024   LONG TERM GOALS: Target date: 10/29/2024>12/10/2024  Pt will be independent with HEP for improved balance, strength, gait, stairs. Baseline: continuing to update Goal status: IN PROGRESS, 10/27/2024  2.  Pt will improve DGI score to at least 20/24 to decrease fall risk. Baseline: 11/24>16/24 Goal status:IN PROGRESS, 11/19/205  3.  Pt will improve gait velocity to at least 2.6 ft/sec with cane for improved gait efficiency and safety. Baseline: 0.98 ft/sec>2.1 ft/sec with cane 10/27/2024 Goal status: IN PROGRESS, 10/27/2024  4.  Pt will ambulate at least 500 ft, indoor and outdoor gait, mod I with cane for improved community mobility. Baseline: community distances with daughter, per report Goal status: MET, 10/27/2024  5.  Pt will improve TUG score to less than or equal to 13.5 sec without device, for decreased fall risk.  Baseline:  TUG no cane 17.13 sec 10/27/24  Goal status:  INITIAL  6.  Pt will improve TUG manual score to less than or equal to 15 sec for decreased fall risk.  Baseline:  19.87 sec 10/27/24  Goal status:  INITIAL  7.  Pt will improve gait velocity *with no cane* to at least 2.3 ft/sec for improved gait efficiency and safety.  Baseline:  1.82 ft/sec 10/27/24  Goal status:  INITIAL  8.  Pt will improve 5x sit<>stand to less than or equal to 14.5 sec to demonstrate improved functional strength and transfer efficiency.  Baseline:  16.16 sec 10/27/24  Goal status:  INITIAL    ASSESSMENT:  CLINICAL IMPRESSION: Pt presents today with reports of continued overall improvements, though  she notes still decreased confidence with household gait without device.  With gait and balance testing, PT notes that pt is taking overall smaller, more hesitant steps, antalgic pattern on LLE.  With cues for equal, even step length, pt is able to improve her posture, arm swing with gait and appears more stable and confident.  Worked throughout session today without device, with reinforcement cues for optimal gait and transfers.  Gait velocity 2.48 ft/sec, TUG score 13.38 sec and FTSTS 13.72 sec are all improved from last measures; will continue to work to make sure she is able to consistently keep these measures at this level.  She will continue to benefit from Skilled PT towards goals for improved functional mobility and decreased fall risk.  OBJECTIVE IMPAIRMENTS: Abnormal gait, decreased balance, decreased mobility, difficulty walking, and decreased strength.   ACTIVITY LIMITATIONS: standing, stairs, transfers, and locomotion level  PARTICIPATION LIMITATIONS: meal prep, cleaning, driving, shopping,  community activity, yard work, and church  PERSONAL FACTORS: 3+ comorbidities: see PMH above are also affecting patient's functional outcome.   REHAB POTENTIAL: Good  CLINICAL DECISION MAKING: Evolving/moderate complexity  EVALUATION COMPLEXITY: Moderate  PLAN:  PT FREQUENCY: 2x/week  PT DURATION: 6 weeks per recert 10/27/2024  PLANNED INTERVENTIONS: 97750- Physical Performance Testing, 97110-Therapeutic exercises, 97530- Therapeutic activity, 97112- Neuromuscular re-education, 97535- Self Care, [id number]- Manual therapy, [phone]- Gait training, Patient/Family education, Balance training, Stair training, and DME instructions  PLAN FOR NEXT SESSION:  How did walking practice go at home without device?  SLS, glut activation in standing.  Continue weight progression/resistance with standing exercises at counter.  Gait training and balance training in clinic no device as appropriate.

## 2024-11-29 ENCOUNTER — Ambulatory Visit: Admitting: Occupational Therapy

## 2024-11-29 ENCOUNTER — Ambulatory Visit: Admitting: Physical Therapy

## 2024-11-29 ENCOUNTER — Encounter: Payer: Self-pay | Admitting: Physical Therapy

## 2024-11-29 DIAGNOSIS — M25632 Stiffness of left wrist, not elsewhere classified: Secondary | ICD-10-CM

## 2024-11-29 DIAGNOSIS — R208 Other disturbances of skin sensation: Secondary | ICD-10-CM

## 2024-11-29 DIAGNOSIS — M6281 Muscle weakness (generalized): Secondary | ICD-10-CM | POA: Diagnosis not present

## 2024-11-29 DIAGNOSIS — R2689 Other abnormalities of gait and mobility: Secondary | ICD-10-CM

## 2024-11-29 DIAGNOSIS — R2681 Unsteadiness on feet: Secondary | ICD-10-CM

## 2024-11-29 DIAGNOSIS — R278 Other lack of coordination: Secondary | ICD-10-CM

## 2024-11-29 NOTE — Therapy (Signed)
 " OUTPATIENT OCCUPATIONAL THERAPY ORTHO Treatment Note  Patient Name: Monica Curry MRN: [id number] DOB:24-May-1931, 89 y.o., female Today's Date: 11/29/2024  PCP: Wendolyn Jenkins Jansky, MD  REFERRING PROVIDER: Arlinda Buster, MD  END OF SESSION:  OT End of Session - 11/29/24 1325     Visit Number 7    Number of Visits 11    Date for Recertification  12/08/24    Authorization Type [employer]    Authorization Time Period Auth#: [id number] , approved 11 OT visits from 10/27/2024 - 01/25/2025    Authorization - Visit Number 6    Authorization - Number of Visits 11    OT Start Time 1151    OT Stop Time 1231    OT Time Calculation (min) 40 min                Past Medical History:  Diagnosis Date   Allergy    Medications and pollen   Anemia    Anxiety    Always   C. difficile colitis    Cancer (HCC)    Endometrial cancer   Cataract    Had surgery 2021   Chronic kidney disease 2018   2 siblings had kidney failure, sister had transplant   Clotting disorder    Essential and other specified forms of tremor    Hyperlipidemia    Hypertension    Osteoporosis 11/2019   T score -2.8 stable from prior DEXA   Personal history of venous thrombosis and embolism 1997   Pulmonary embolism (HCC) 06/14/2021   Pulmonary embolus (HCC)    Shingles    Stroke (HCC) 2018   TIA   Thrush    TIA (transient ischemic attack)    carotid US  07/2022: no ICA stenosis bilaterally   Past Surgical History:  Procedure Laterality Date   ABDOMINAL HYSTERECTOMY  1997   APPENDECTOMY  1961   cataract surg Bilateral 2021   Dr. Roz, R eye 8/18, L 8/25   CESAREAN SECTION  1961   EYE SURGERY  2021   Cataract both eyes   FRACTURE SURGERY  2025   Broken hip   INTRAMEDULLARY (IM) NAIL INTERTROCHANTERIC Left 04/28/2024   Procedure: FIXATION, FRACTURE, INTERTROCHANTERIC, WITH INTRAMEDULLARY ROD;  Surgeon: Kendal Franky SQUIBB, MD;  Location: [facility] OR;  Service: Orthopedics;  Laterality: Left;    TONSILLECTOMY AND ADENOIDECTOMY  1944   TOTAL ABDOMINAL HYSTERECTOMY W/ BILATERAL SALPINGOOPHORECTOMY  1997   Patient Active Problem List   Diagnosis Date Noted   RLS (restless legs syndrome) 06/15/2024   Fracture, Colles, left, closed 04/28/2024   Hip fracture (HCC) 04/27/2024   GAD (generalized anxiety disorder) 04/01/2024   Thyroid  nodule 01/10/2022   Anxiety 08/10/2021   Aortic atherosclerosis (HCC) - CT 06/2021 06/15/2021   Bilateral renal cysts 07/06/2020   Paroxysmal supraventricular tachycardia 10/20/2019   History of skin cancer (basal cell) 06/16/2019   Complete right bundle branch block 06/16/2019   B12 deficiency 06/15/2019   History of TIA (transient ischemic attack) 10/23/2017   Chronic kidney disease, stage 3b (HCC) 07/21/2015   Medication management 07/04/2014   Essential hypertension 11/25/2013   Hyperlipidemia, mixed 11/25/2013   Abnormal glucose 11/25/2013   Vitamin D  deficiency 11/25/2013   Essential tremor 11/25/2013   History of endometrial cancer    Hx/o DVT/PE    Urethral prolapse    Osteoporosis 10/10/2011    ONSET DATE: referral date 10/07/24 (injury 04/27/24 and [facility] sx 10/12/24)  REFERRING DIAG: G56.02 (ICD-10-CM) - Carpal tunnel syndrome, left upper  limb  THERAPY DIAG:  Stiffness of left wrist, not elsewhere classified  Other lack of coordination  Other disturbances of skin sensation  Rationale for Evaluation and Treatment: Rehabilitation  SUBJECTIVE:   SUBJECTIVE STATEMENT: Pt reporting recognizing a ridge in her hand that she had not noticed before.  Pt accompanied by: self  PERTINENT HISTORY:   Per MD note 10/26/24: 89 y.o. female who presents today for follow up 2 weeks status post left wrist open carpal tunnel release.  Doing well overall, pain controlled, does have persistent numbness and tingling.  Left hand: - Well-healing palmar incision, sutures removed, skin edges well-approximated without erythema or drainage - Composite  fist limited secondary to stiffness, improved passively - Sensation remains diminished to light touch in the median nerve distribution - 4/5 APB mild thenar atrophy, thumb opposition to ring finger PIP  PRECAUTIONS: Fall  WEIGHT BEARING RESTRICTIONS: No  PAIN:  Are you having pain? Yes: NPRS scale: minimal Pain location: palm of hand Pain description: twinge Aggravating factors: touching hand around incision Relieving factors: being still  FALLS: Has patient fallen in last 6 months? Yes. Number of falls 1 - fall in May that resulted in hip and wrist fractures.  LIVING ENVIRONMENT: Lives with: lives alone Lives in: House/apartment Stairs: 3 steps to enter, flight of steps to the basement but she does not go down them Has following equipment at home: [name] - 2 wheeled, single point cane, shower chair, and Grab bars  PLOF: Independent with basic ADLs and Requires assistive device for independence; step-daughter is currently doing laundry at her home as pt's washer/dryer are in the basement  PATIENT GOALS: to be able to use Left hand again   NEXT MD VISIT: 11/23/24  OBJECTIVE:  Note: Objective measures were completed at Evaluation unless otherwise noted.  HAND DOMINANCE: Right  ADLs: WFL; difficulty with donning compression sock on L leg but is able to don  FUNCTIONAL OUTCOME MEASURES: Quick Dash: 50%   UPPER EXTREMITY ROM:     Active ROM Right eval Left eval Left 11/22/24  Shoulder flexion     Shoulder abduction     Shoulder adduction     Shoulder extension     Shoulder internal rotation     Shoulder external rotation     Elbow flexion     Elbow extension     Wrist flexion 75 34 30  Wrist extension 60 17 40  Wrist ulnar deviation 30 18 22   Wrist radial deviation 12 10 8   Wrist pronation  WFL   Wrist supination  80%   (Blank rows = not tested)  Loose grasp on L, however all fingers are able to touch palm of hand lightly in open fist, decreased full fist,  hook fist, and table top  HAND FUNCTION: Not assessed due to stitches just removed yesterday.  On eval prior to surgery 08/30/24: Right: 40 lbs; Left: 5 lbs  COORDINATION: 9 Hole Peg test: Right: 30.13 sec; Left: 61.75 sec; Mild B tremors  SENSATION: Numbness and tingling in R hand and digits along median nerve distribution  EDEMA: NA  COGNITION: Overall cognitive status: Within functional limits for tasks assessed   OBSERVATIONS: Pt L wrist at ulnar head and dorsal wrist enlarged compared to R wrist and hand due to bone deformity at dorsal wrist and ulnar styloid s/p setting of fracture back in May 2025. Pt demonstrating ~50% movement in all directions when compared to R, but verbalizing good understanding of exercises.    TREATMENT  DATE:  11/29/24 Scar mobilization/massage: OT starting with massage in palm of hand along tendon in 4th digit.  OT providing massage in circular, side to side, and up/down direction around and over incision to increase the range of motion around the scar, especially thenar eminence and into palm of hand.  OT educating on progressive pressure as healing continues and increased massage at incision due to progress in healing. Wrist ROM: engaged in wrist flexion with overpressure x8 with cues to hold position ~5 seconds.  Progressed to circumduction clockwise and counter-clockwise x5 in each direction.  Pt demonstrating decreased motor control when rotating into flexion and radial deviation both directions. Then engaged in ulnar/radial deviation x10. Strengthening/ROM: Transitioned to [facility] with towel squeezes for strengthening and ringing out towel for wrist flexion/extension. Pt demonstrating difficulty with wrist flexion/extension sequencing, however demonstrating good mobility during functional task.  Pt demonstrating good technique.    11/24/24 Wrist ROM: engaged in wrist flexion/extension followed by radial/ulnar deviation with rolling weighted ball on table  top.  OT providing verbal cues for technique to ensure proper technique to facilitate ROM.  Progressed to flexion/extension with 1# dumbbell with forearm braced on table top with hand over edge to further facilitate increased active ROM.  Completing x10 in each direction. Transitioned to circumduction clockwise and counter-clockwise without weight.  Pt demonstrating decreased motor control when rotating into flexion and radial deviation both directions.  Therapeutic activity: engaged in card sorting and matching by suit with focus on picking up and flipping cards to facilitate wrist flexion and extension during functional task.  Pt demonstrating decreased ROM with supination and ulnar deviation during flipping cards.   11/22/24 Heat: applied moist heat to L wrist in preparation for ROM exercises.  OT reiterating education on checking skin prior to and post heat for skin integrity.  Pt reporting that she has been thinking about relaxing her shoulder when she is cooking/baking or transporting items with good success.  Discussed scheduling with approved visits and remaining certification, adding visits through 12/10/24 (plan to assess goals on 12/07/24 and either d/c or re-cert). ROM: pt completing active wrist flexion/extension and ulnar/radial deviation with hold at end range x5 seconds, completing each exercise x10.  Completed tendon glides with table top, flat fist, and hook fist.  OT encouraging over pressure during flat fist and hook fist to attempt increased PROM.   Measurements taken: see above Scar mobilization/massage: OT providing massage in circular, side to side, and up/down direction around and over incision to increase the range of motion around the scar, especially thenar eminence.  OT educating on progressive pressure as healing continues and increased massage at incision due to progress in healing.   PATIENT EDUCATION: Education details: massage, ROM Person educated: Patient Education  method: Explanation, Demonstration, Verbal cues, and Handouts Education comprehension: verbalized understanding and needs further education  HOME EXERCISE PROGRAM: Carpal tunnel release HEP (see pt instructions)  Access Code: [email or web address] URL: [email or web address] Date: 11/10/2024 Prepared by: [facility] - Outpatient  Rehab - [facility]  Exercises - Putty Squeezes  - 2 x daily - 15 reps - 3-Point Pinch with Putty  - 2 x daily - 15 reps - Finger Lumbricals with Putty  - 2 x daily - 15 reps - Seated Wrist Extension Stretch  - 2 x daily - 5-10 reps - Seated Wrist Flexion Stretch  - 2 x daily - 5-10 reps  GOALS: Goals reviewed with patient? Yes  SHORT TERM GOALS: Target date: 11/19/24  Pt will be  independent with HEP for ROM and strengthening. Baseline: new to OPOT Goal status: in progress   2.  Pt will verbalize understanding of task modifications and/or potential A/E needs to increase ease, safety, and independence w/ ADLs. Baseline: difficulty with donning compression socks d/t decreased grip and mobility of L wrist/hand Goal status: in progress   3.  Pt will demonstrate improvements in wrist flexion/extension and functional pinch to complete 9 hole peg test with LUE in <55 seconds. Baseline: 61 sec 11/22/24: 39.56 sec Goal status: MET  4.  Pt will verbalize understanding of re-sensitization strategies as well as compensatory strategies due to impaired sensation.  Baseline: impaired sensation in hand at median nerve distribution  Goal status: in progress    LONG TERM GOALS: Target date: 12/08/24  Pt will demonstrate improved coordination of LUE as evidenced by completing 9 hole peg test in 45 seconds or less as needed for [facility] tasks with ADLs/IADLS. Baseline: 61 Goal status: in progress  2.  Pt will improve grip strength in left hand to be within at least 20 pounds of her right hand for functional use at home and in IADLs.  Baseline: L: 5# and R:  40# Goal status: in progress   3.  Pt will demonstrate improved L wrist flexion/extension to 75% of R wrist flexion/extension for improved functional use.  Baseline: R:75/60 and L: 34/17 Goal status: in progress   4.  Pt will demonstrate improved L forearm supination to 100% of R forearm supination for improved functional use. Baseline: 80% Goal status: in progress   5.  Pt will demonstrate improvements in pain and functional use of LUE as evidenced by decreased score on QuickDASH from 50% impairement to < 30 % impairment.  Baseline: 50% Goal status: in progress  6.  Pt will verbalize understanding of safe modifications and/or adaptive techniques to complete laundry tasks at Mod I level. Baseline: step-daughter is currently completing due to laundry room being in basement Goal status: in progress  ASSESSMENT:  CLINICAL IMPRESSION: Patient is a 89 y.o. female who was seen today for occupational therapy treatment s/p L carpal tunnel release. Pt is reporting decreased pain, however stiffness and diminished sensation persisting.  Pt demonstrating good tolerance to increased resistance with overpressure and attempts at functional grasp with hand towel.  Pt still demonstrating decreased extension and coordination when moving in and out of flexion. Pt will continue to benefit from skilled OT services to address ROM, strengthening, and improved functional use of LUE.  PERFORMANCE DEFICITS: in functional skills including ADLs, IADLs, coordination, sensation, edema, ROM, strength, flexibility, Fine motor control, Gross motor control, body mechanics, endurance, decreased knowledge of precautions, and UE functional use and psychosocial skills including routines and behaviors.      PLAN:  OT FREQUENCY: 1-2x/week  OT DURATION: 6 weeks  PLANNED INTERVENTIONS: 97168 OT Re-evaluation, 97535 self care/ADL training, 02889 therapeutic exercise, 97530 therapeutic activity, 97112 neuromuscular  re-education, 97140 manual therapy, 97035 ultrasound, 97018 paraffin, 02239 Orthotic Initial, 97763 Orthotic/Prosthetic subsequent, passive range of motion, compression bandaging, coping strategies training, patient/family education, and DME and/or AE instructions   RECOMMENDED OTHER SERVICES: NA  CONSULTED AND AGREED WITH PLAN OF CARE: Patient  PLAN FOR NEXT SESSION: Review HEP, review scar massage, use of modalities for ROM and pain relief, after 4-6 weeks s/p sx can start light strengthening    Rebekha Diveley, LAURAINE, OTR/L 11/29/2024, 1:26 PM  [facility] [address], [address] [place], [place] [address] Phone # [phone] Fax # [phone]  "

## 2024-12-01 ENCOUNTER — Ambulatory Visit

## 2024-12-01 DIAGNOSIS — M6281 Muscle weakness (generalized): Secondary | ICD-10-CM

## 2024-12-01 DIAGNOSIS — R278 Other lack of coordination: Secondary | ICD-10-CM

## 2024-12-01 DIAGNOSIS — M25632 Stiffness of left wrist, not elsewhere classified: Secondary | ICD-10-CM

## 2024-12-01 DIAGNOSIS — R2681 Unsteadiness on feet: Secondary | ICD-10-CM

## 2024-12-01 DIAGNOSIS — R2689 Other abnormalities of gait and mobility: Secondary | ICD-10-CM

## 2024-12-01 DIAGNOSIS — R208 Other disturbances of skin sensation: Secondary | ICD-10-CM

## 2024-12-01 NOTE — Therapy (Signed)
 " OUTPATIENT OCCUPATIONAL THERAPY ORTHO Treatment Note  Patient Name: Monica Curry MRN: [id number] DOB:20-Sep-1931, 89 y.o., female Today's Date: 12/01/2024  PCP: Wendolyn Jenkins Jansky, MD  REFERRING PROVIDER: Arlinda Buster, MD  END OF SESSION:  OT End of Session - 12/01/24 1101     Visit Number 8    Number of Visits 11    Date for Recertification  12/08/24    Authorization Type [employer]    Authorization Time Period Auth#: [id number] , approved 11 OT visits from 10/27/2024 - 01/25/2025    Authorization - Visit Number 7    Authorization - Number of Visits 11    OT Start Time 1100    OT Stop Time 1145    OT Time Calculation (min) 45 min    Equipment Utilized During Treatment velcro board, mini massager    Activity Tolerance Patient tolerated treatment well    Behavior During Therapy WFL for tasks assessed/performed                Past Medical History:  Diagnosis Date   Allergy    Medications and pollen   Anemia    Anxiety    Always   C. difficile colitis    Cancer (HCC)    Endometrial cancer   Cataract    Had surgery 2021   Chronic kidney disease 2018   2 siblings had kidney failure, sister had transplant   Clotting disorder    Essential and other specified forms of tremor    Hyperlipidemia    Hypertension    Osteoporosis 11/2019   T score -2.8 stable from prior DEXA   Personal history of venous thrombosis and embolism 1997   Pulmonary embolism (HCC) 06/14/2021   Pulmonary embolus (HCC)    Shingles    Stroke (HCC) 2018   TIA   Thrush    TIA (transient ischemic attack)    carotid US  07/2022: no ICA stenosis bilaterally   Past Surgical History:  Procedure Laterality Date   ABDOMINAL HYSTERECTOMY  1997   APPENDECTOMY  1961   cataract surg Bilateral 2021   Dr. Roz, R eye 8/18, L 8/25   CESAREAN SECTION  1961   EYE SURGERY  2021   Cataract both eyes   FRACTURE SURGERY  2025   Broken hip   INTRAMEDULLARY (IM) NAIL INTERTROCHANTERIC Left  04/28/2024   Procedure: FIXATION, FRACTURE, INTERTROCHANTERIC, WITH INTRAMEDULLARY ROD;  Surgeon: Kendal Franky SQUIBB, MD;  Location: [facility] OR;  Service: Orthopedics;  Laterality: Left;   TONSILLECTOMY AND ADENOIDECTOMY  1944   TOTAL ABDOMINAL HYSTERECTOMY W/ BILATERAL SALPINGOOPHORECTOMY  1997   Patient Active Problem List   Diagnosis Date Noted   RLS (restless legs syndrome) 06/15/2024   Fracture, Colles, left, closed 04/28/2024   Hip fracture (HCC) 04/27/2024   GAD (generalized anxiety disorder) 04/01/2024   Thyroid  nodule 01/10/2022   Anxiety 08/10/2021   Aortic atherosclerosis (HCC) - CT 06/2021 06/15/2021   Bilateral renal cysts 07/06/2020   Paroxysmal supraventricular tachycardia 10/20/2019   History of skin cancer (basal cell) 06/16/2019   Complete right bundle branch block 06/16/2019   B12 deficiency 06/15/2019   History of TIA (transient ischemic attack) 10/23/2017   Chronic kidney disease, stage 3b (HCC) 07/21/2015   Medication management 07/04/2014   Essential hypertension 11/25/2013   Hyperlipidemia, mixed 11/25/2013   Abnormal glucose 11/25/2013   Vitamin D  deficiency 11/25/2013   Essential tremor 11/25/2013   History of endometrial cancer    Hx/o DVT/PE    Urethral prolapse  Osteoporosis 10/10/2011    ONSET DATE: referral date 10/07/24 (injury 04/27/24 and [facility] sx 10/12/24)  REFERRING DIAG: G56.02 (ICD-10-CM) - Carpal tunnel syndrome, left upper limb  THERAPY DIAG:  Muscle weakness (generalized)  Stiffness of left wrist, not elsewhere classified  Other lack of coordination  Other disturbances of skin sensation  Rationale for Evaluation and Treatment: Rehabilitation  SUBJECTIVE:   SUBJECTIVE STATEMENT: Pt reports increased numbness in fingertips.  Pt accompanied by: self  PERTINENT HISTORY:   Per MD note 10/26/24: 89 y.o. female who presents today for follow up 2 weeks status post left wrist open carpal tunnel release.  Doing well overall, pain  controlled, does have persistent numbness and tingling.  Left hand: - Well-healing palmar incision, sutures removed, skin edges well-approximated without erythema or drainage - Composite fist limited secondary to stiffness, improved passively - Sensation remains diminished to light touch in the median nerve distribution - 4/5 APB mild thenar atrophy, thumb opposition to ring finger PIP  PRECAUTIONS: Fall  WEIGHT BEARING RESTRICTIONS: No  PAIN:  Are you having pain? Yes: NPRS scale: minimal Pain location: palm of hand Pain description: twinge Aggravating factors: touching hand around incision Relieving factors: being still  FALLS: Has patient fallen in last 6 months? Yes. Number of falls 1 - fall in May that resulted in hip and wrist fractures.  LIVING ENVIRONMENT: Lives with: lives alone Lives in: House/apartment Stairs: 3 steps to enter, flight of steps to the basement but she does not go down them Has following equipment at home: [name] - 2 wheeled, single point cane, shower chair, and Grab bars  PLOF: Independent with basic ADLs and Requires assistive device for independence; step-daughter is currently doing laundry at her home as pt's washer/dryer are in the basement  PATIENT GOALS: to be able to use Left hand again   NEXT MD VISIT: 11/23/24  OBJECTIVE:  Note: Objective measures were completed at Evaluation unless otherwise noted.  HAND DOMINANCE: Right  ADLs: WFL; difficulty with donning compression sock on L leg but is able to don  FUNCTIONAL OUTCOME MEASURES: Quick Dash: 50%   UPPER EXTREMITY ROM:     Active ROM Right eval Left eval Left 11/22/24  Shoulder flexion     Shoulder abduction     Shoulder adduction     Shoulder extension     Shoulder internal rotation     Shoulder external rotation     Elbow flexion     Elbow extension     Wrist flexion 75 34 30  Wrist extension 60 17 40  Wrist ulnar deviation 30 18 22   Wrist radial deviation 12 10 8    Wrist pronation  WFL   Wrist supination  80%   (Blank rows = not tested)  Loose grasp on L, however all fingers are able to touch palm of hand lightly in open fist, decreased full fist, hook fist, and table top  HAND FUNCTION: Not assessed due to stitches just removed yesterday.  On eval prior to surgery 08/30/24: Right: 40 lbs; Left: 5 lbs  COORDINATION: 9 Hole Peg test: Right: 30.13 sec; Left: 61.75 sec; Mild B tremors  SENSATION: Numbness and tingling in R hand and digits along median nerve distribution  EDEMA: NA  COGNITION: Overall cognitive status: Within functional limits for tasks assessed   OBSERVATIONS: Pt L wrist at ulnar head and dorsal wrist enlarged compared to R wrist and hand due to bone deformity at dorsal wrist and ulnar styloid s/p setting of fracture back in  May 2025. Pt demonstrating ~50% movement in all directions when compared to R, but verbalizing good understanding of exercises.    TREATMENT DATE:  12/01/24 - Manual therapy completed for duration as noted below including: Educated pt in sensitization techniques, see Pt instructions for handout provided. Pt confirms she completes most of these already, reports she hasn't done the submerging as much recently. Educated pt in radiation protection practitioner use and benefits of vibration for sensitization purposes, applied mini massager to L hand with individual attention to fingertips. Pt reported that it felt good but that she could not tell much difference in regards to sensation. - Therapeutic exercises completed for duration as noted below including: Checked pt's L supination, WNL. See Goals for updated measurement. With 1# and pillow serving as bolster pt completed 2x10 wrist flexion, wrist extension, radial deviation, and ulnar deviation. - Therapeutic activities completed for duration as noted below including: Pt engaged in resistive wrist flexion, wrist extension, pronation, supination, and individual digit  flexion/extension as well as composite digit flexion/extension moving items across velcro board. Pt participated well.  11/29/24 Scar mobilization/massage: OT starting with massage in palm of hand along tendon in 4th digit.  OT providing massage in circular, side to side, and up/down direction around and over incision to increase the range of motion around the scar, especially thenar eminence and into palm of hand.  OT educating on progressive pressure as healing continues and increased massage at incision due to progress in healing. Wrist ROM: engaged in wrist flexion with overpressure x8 with cues to hold position ~5 seconds.  Progressed to circumduction clockwise and counter-clockwise x5 in each direction.  Pt demonstrating decreased motor control when rotating into flexion and radial deviation both directions. Then engaged in ulnar/radial deviation x10. Strengthening/ROM: Transitioned to [facility] with towel squeezes for strengthening and ringing out towel for wrist flexion/extension. Pt demonstrating difficulty with wrist flexion/extension sequencing, however demonstrating good mobility during functional task.  Pt demonstrating good technique.    11/24/24 Wrist ROM: engaged in wrist flexion/extension followed by radial/ulnar deviation with rolling weighted ball on table top.  OT providing verbal cues for technique to ensure proper technique to facilitate ROM.  Progressed to flexion/extension with 1# dumbbell with forearm braced on table top with hand over edge to further facilitate increased active ROM.  Completing x10 in each direction. Transitioned to circumduction clockwise and counter-clockwise without weight.  Pt demonstrating decreased motor control when rotating into flexion and radial deviation both directions.  Therapeutic activity: engaged in card sorting and matching by suit with focus on picking up and flipping cards to facilitate wrist flexion and extension during functional task.  Pt  demonstrating decreased ROM with supination and ulnar deviation during flipping cards.   11/22/24 Heat: applied moist heat to L wrist in preparation for ROM exercises.  OT reiterating education on checking skin prior to and post heat for skin integrity.  Pt reporting that she has been thinking about relaxing her shoulder when she is cooking/baking or transporting items with good success.  Discussed scheduling with approved visits and remaining certification, adding visits through 12/10/24 (plan to assess goals on 12/07/24 and either d/c or re-cert). ROM: pt completing active wrist flexion/extension and ulnar/radial deviation with hold at end range x5 seconds, completing each exercise x10.  Completed tendon glides with table top, flat fist, and hook fist.  OT encouraging over pressure during flat fist and hook fist to attempt increased PROM.   Measurements taken: see above Scar mobilization/massage: OT providing massage in  circular, side to side, and up/down direction around and over incision to increase the range of motion around the scar, especially thenar eminence.  OT educating on progressive pressure as healing continues and increased massage at incision due to progress in healing.   PATIENT EDUCATION: Education details: massage, ROM Person educated: Patient Education method: Explanation, Demonstration, Verbal cues, and Handouts Education comprehension: verbalized understanding and needs further education  HOME EXERCISE PROGRAM: Carpal tunnel release HEP (see pt instructions)  Access Code: [email or web address] URL: [email or web address] Date: 11/10/2024 Prepared by: [facility] - Outpatient  Rehab - [facility]  Exercises - Putty Squeezes  - 2 x daily - 15 reps - 3-Point Pinch with Putty  - 2 x daily - 15 reps - Finger Lumbricals with Putty  - 2 x daily - 15 reps - Seated Wrist Extension Stretch  - 2 x daily - 5-10 reps - Seated Wrist Flexion Stretch  - 2 x daily - 5-10  reps  GOALS: Goals reviewed with patient? Yes  SHORT TERM GOALS: Target date: 12/08/24  Pt will be independent with HEP for ROM and strengthening. Baseline: new to OPOT Goal status: in progress   2.  Pt will verbalize understanding of task modifications and/or potential A/E needs to increase ease, safety, and independence w/ ADLs. Baseline: difficulty with donning compression socks d/t decreased grip and mobility of L wrist/hand Goal status: in progress   3.  Pt will demonstrate improvements in wrist flexion/extension and functional pinch to complete 9 hole peg test with LUE in <55 seconds. Baseline: 61 sec 11/22/24: 39.56 sec Goal status: MET  4.  Pt will verbalize understanding of re-sensitization strategies as well as compensatory strategies due to impaired sensation.  Baseline: impaired sensation in hand at median nerve distribution  Goal status: in progress    LONG TERM GOALS: Target date: 12/08/24  Pt will demonstrate improved coordination of LUE as evidenced by completing 9 hole peg test in 45 seconds or less as needed for [facility] tasks with ADLs/IADLS. Baseline: 61 Goal status: in progress  2.  Pt will improve grip strength in left hand to be within at least 20 pounds of her right hand for functional use at home and in IADLs.  Baseline: L: 5# and R: 40# Goal status: in progress   3.  Pt will demonstrate improved L wrist flexion/extension to 75% of R wrist flexion/extension for improved functional use.  Baseline: R:75/60 and L: 34/17 Goal status: in progress   4.  Pt will demonstrate improved L forearm supination to 100% of R forearm supination for improved functional use. Baseline: 80% 12/01/24: 100% Goal status: MET   5.  Pt will demonstrate improvements in pain and functional use of LUE as evidenced by decreased score on QuickDASH from 50% impairement to < 30 % impairment.  Baseline: 50% Goal status: in progress  6.  Pt will verbalize understanding of safe  modifications and/or adaptive techniques to complete laundry tasks at Mod I level. Baseline: step-daughter is currently completing due to laundry room being in basement Goal status: in progress  ASSESSMENT:  CLINICAL IMPRESSION: Patient is a 89 y.o. female who was seen today for occupational therapy treatment s/p L carpal tunnel release. Pt continues to report diminished sensation in LUE but does demonstrate improved ROM in LUE supination. Pt will continue to benefit from skilled OT services to address ROM, strengthening, and improved functional use of LUE.  PERFORMANCE DEFICITS: in functional skills including ADLs, IADLs, coordination, sensation, edema, ROM, strength, flexibility, Fine  motor control, Gross motor control, body mechanics, endurance, decreased knowledge of precautions, and UE functional use and psychosocial skills including routines and behaviors.      PLAN:  OT FREQUENCY: 1-2x/week  OT DURATION: 6 weeks  PLANNED INTERVENTIONS: 97168 OT Re-evaluation, 97535 self care/ADL training, 02889 therapeutic exercise, 97530 therapeutic activity, 97112 neuromuscular re-education, 97140 manual therapy, 97035 ultrasound, 97018 paraffin, 02239 Orthotic Initial, 97763 Orthotic/Prosthetic subsequent, passive range of motion, compression bandaging, coping strategies training, patient/family education, and DME and/or AE instructions   RECOMMENDED OTHER SERVICES: NA  CONSULTED AND AGREED WITH PLAN OF CARE: Patient  PLAN FOR NEXT SESSION: Continue use of modalities for ROM and pain relief, after 4-6 weeks s/p sx can start light strengthening, progress STGs/LTGs    Rocky Dutch, OTR/L 12/01/2024, 11:59 AM  [facility] at [facility] [address] [place], [place] [address] Phone # [phone] Fax # [phone]  "

## 2024-12-01 NOTE — Therapy (Signed)
 " OUTPATIENT PHYSICAL THERAPY NEURO TREATMENT NOTE  Patient Name: Monica Curry MRN: [id number] DOB:03/03/1931, 89 y.o., female Today's Date: 12/01/2024   PCP: Wendolyn Jenkins Jansky, MD REFERRING PROVIDER: Danton Lauraine LABOR, PA-C       END OF SESSION:  PT End of Session - 12/01/24 1115     Visit Number 22    Number of Visits 26    Date for Recertification  12/10/24    Authorization Type [employer]-reauth submitted    Authorization Time Period 12 visits approved from 11/24 to 12/10/24    Authorization - Visit Number 8    Authorization - Number of Visits 12    Progress Note Due on Visit 20    PT Start Time 1145    PT Stop Time 1230    PT Time Calculation (min) 45 min    Equipment Utilized During Treatment Gait belt    Activity Tolerance Patient tolerated treatment well    Behavior During Therapy WFL for tasks assessed/performed                         Past Medical History:  Diagnosis Date   Allergy    Medications and pollen   Anemia    Anxiety    Always   C. difficile colitis    Cancer (HCC)    Endometrial cancer   Cataract    Had surgery 2021   Chronic kidney disease 2018   2 siblings had kidney failure, sister had transplant   Clotting disorder    Essential and other specified forms of tremor    Hyperlipidemia    Hypertension    Osteoporosis 11/2019   T score -2.8 stable from prior DEXA   Personal history of venous thrombosis and embolism 1997   Pulmonary embolism (HCC) 06/14/2021   Pulmonary embolus (HCC)    Shingles    Stroke (HCC) 2018   TIA   Thrush    TIA (transient ischemic attack)    carotid US  07/2022: no ICA stenosis bilaterally   Past Surgical History:  Procedure Laterality Date   ABDOMINAL HYSTERECTOMY  1997   APPENDECTOMY  1961   cataract surg Bilateral 2021   Dr. Roz, R eye 8/18, L 8/25   CESAREAN SECTION  1961   EYE SURGERY  2021   Cataract both eyes   FRACTURE SURGERY  2025   Broken hip   INTRAMEDULLARY (IM)  NAIL INTERTROCHANTERIC Left 04/28/2024   Procedure: FIXATION, FRACTURE, INTERTROCHANTERIC, WITH INTRAMEDULLARY ROD;  Surgeon: Kendal Franky SQUIBB, MD;  Location: [facility] OR;  Service: Orthopedics;  Laterality: Left;   TONSILLECTOMY AND ADENOIDECTOMY  1944   TOTAL ABDOMINAL HYSTERECTOMY W/ BILATERAL SALPINGOOPHORECTOMY  1997   Patient Active Problem List   Diagnosis Date Noted   RLS (restless legs syndrome) 06/15/2024   Fracture, Col[place], left, closed 04/28/2024   Hip fracture (HCC) 04/27/2024   GAD (generalized anxiety disorder) 04/01/2024   Thyroid  nodule 01/10/2022   Anxiety 08/10/2021   Aortic atherosclerosis (HCC) - CT 06/2021 06/15/2021   Bilateral renal cysts 07/06/2020   Paroxysmal supraventricular tachycardia 10/20/2019   History of skin cancer (basal cell) 06/16/2019   Complete right bundle branch block 06/16/2019   B12 deficiency 06/15/2019   History of TIA (transient ischemic attack) 10/23/2017   Chronic kidney disease, stage 3b (HCC) 07/21/2015   Medication management 07/04/2014   Essential hypertension 11/25/2013   Hyperlipidemia, mixed 11/25/2013   Abnormal glucose 11/25/2013   Vitamin D  deficiency 11/25/2013  Essential tremor 11/25/2013   History of endometrial cancer    Hx/o DVT/PE    Urethral prolapse    Osteoporosis 10/10/2011    ONSET DATE: 04/28/2024  REFERRING DIAG: S/P L intertrachanteric femur fracture, Left distal radius fracture 04/28/2024   THERAPY DIAG:  Muscle weakness (generalized)  Other abnormalities of gait and mobility  Unsteadiness on feet  Rationale for Evaluation and Treatment: Rehabilitation  SUBJECTIVE:                                                                                                                                                                                             SUBJECTIVE STATEMENT: Doing well, no hip pain  Pt accompanied by: self  PERTINENT HISTORY: osteoporosis, hx of DVT; see additional PMH above  PAIN:   Are you having pain? No   PRECAUTIONS: Fall, wears L compression stocking due to L LE edema (was negative for DVT)  **CTR surgery LUE 10/12/24:  wearing bandage for 5 days, no lifting, to return to MD 11/18 for removal of stitches, follow up  RED FLAGS: None   WEIGHT BEARING RESTRICTIONS: No  FALLS: Has patient fallen in last 6 months? Yes. Number of falls 1  LIVING ENVIRONMENT: Lives with: lives alone Lives in: House/apartment Stairs: 3 steps to enter home;  Has following equipment at home: Single point cane and Walker - 2 wheeled *cane has tripod base and has gotten quad tip (per 10/28 visit) PLOF: Independent  PATIENT GOALS: To be able to transition to the cane, so I can get in and out of home better.  OBJECTIVE:    TODAY'S TREATMENT: 12/01/24 Activity Comments  Gait training -techniques to improve stride length and LLE push-off for initiation and negotiation of sudden start/stop or turns  Dynamic balance Activities to facilitate righting reactions and single limb support for stepping up or over obstacles  Strength training 2x10 standing hip add 10# cable 2x10 seated hamstring curls 15# Step-ups 1x10 6 and 8  Lateral step-ups 1x10 6/8 w/ HR support               TODAY'S TREATMENT: 11/29/24 Activity Comments  Nustep L5 x 6 min UEs/LEs    alt toe taps on 6 step 4#  CGA and cueing to increase wt shift onto L LE. Less stable on L LE  STS + green medball 10x  Cues for slow lower  straight ahead gait without AD, then with head turns/nods CGA; good stability   fwd/back stepping  Started with chair support, then cane, then without UEs   Fwd, back, side steps over hurdles Occasional use of UE support  HOME EXERCISE PROGRAM: Access Code: [email or web address] URL: [email or web address] Date: 11/01/2024 Prepared by: [facility] - [facility]  Program Notes Marching in place, side hip kicks, back hip kicks:  Do 3 sets of 10 with  2# weight  Exercises - Backward Walking with Counter Support  - 1 x daily - 5 x weekly - 1 sets - 5 reps - Alternating Step Taps with Counter Support  - 1 x daily - 5 x weekly - 3 sets - 10 reps - Side Stepping with Counter Support  - 1 x daily - 5 x weekly - 1 sets - 5 reps - Corner Balance Feet Together With Eyes Closed  - 1 x daily - 7 x weekly - 3 reps - 30 sec hold - Corner Balance Feet Together: Eyes Open With Head Turns  - 1 x daily - 7 x weekly - 3 reps - 30 sec hold - Corner Balance Feet Together: Eyes Closed With Head Turns  - 1 x daily - 7 x weekly - 3 reps - 30 sec hold - Semi-Tandem Corner Balance With Eyes Open  - 1 x daily - 7 x weekly - 3 reps - 15-30 sec hold - Supine Bridge  - 1 x daily - 3-4 x weekly - 3 sets - 10 reps - Hooklying Isometric Clamshell  - 1 x daily - 3-4 x weekly - 3 sets - 10 reps - Sit to Stand with Resistance Around Legs  - 1 x daily - 4 x weekly - 2 sets - 10 reps - Seated Hamstring Stretch  - 1 x daily - 7 x weekly - 1 sets - 3 reps - 30 sec hold - Sidelying Hip Abduction  - 1 x daily - 2-3 x weekly - 3 sets - 10 reps       PATIENT EDUCATION: Education details: 11/24/2024:  Practice with gait at home without device:  loop of rooms, 2-3 reps with LONG, EQUAL, EVEN STRIDES; full upright posture upon standing to allow for confident first step Person educated: Patient Education method: Explanation, Demonstration, and Verbal cues Education comprehension: verbalized understanding and returned demonstration      -------------------------------------------- Note: Objective measures were completed at Evaluation unless otherwise noted.  DIAGNOSTIC FINDINGS: Interval proximal left femoral cephalomedullary nail fixation of the previously seen intertrochanteric fracture. Improved alignment.  COGNITION: Overall cognitive status: Within functional limits for tasks assessed   SENSATION: Reports decreased sensation in L fingers (to see hand  surgeon)  EDEMA:  Noted LLE; wearing compression stocking  POSTURE: rounded shoulders and increased thoracic kyphosis  LOWER EXTREMITY ROM:     Active  Right Eval Left Eval  Hip flexion    Hip extension    Hip abduction    Hip adduction    Hip internal rotation    Hip external rotation    Knee flexion    Knee extension    Ankle dorsiflexion  10  Ankle plantarflexion  40  Ankle inversion    Ankle eversion     (Blank rows = not tested)  LOWER EXTREMITY MMT:    MMT Right Eval Left Eval  Hip flexion 4 3+  Hip extension    Hip abduction 5 4  Hip adduction 5 4  Hip internal rotation    Hip external rotation    Knee flexion 4+ 4+  Knee extension 4+ 4  Ankle dorsiflexion 4+ 4  Ankle plantarflexion    Ankle inversion    Ankle eversion    (  Blank rows = not tested)  TRANSFERS: Sit to stand: Modified independence  Assistive device utilized: None     Stand to sit: Modified independence  Assistive device utilized: hands at chair      STAIRS: Findings: Level of Assistance: Modified independence, Stair Negotiation Technique: Step to Pattern with Single Rail on Right, Number of Stairs: 2-3, Height of Stairs: 4-6   , and Comments: prior to fracture, pt negotiated steps alternating pattern GAIT: Findings: Gait Characteristics: step through pattern, decreased step length- Right, and decreased stance time- Left, Distance walked: 50 ft x 2, Assistive device utilized:Walker - 2 wheeled and SPC with small quad tip base, Level of assistance: Modified independence, CGA, and Min A, and Comments: Mod I with RW, CGA/min assist with cane  FUNCTIONAL TESTS:  10 meter walk test: 16.78 sec RW (1.95 ft/sec); 33.62 sec with cane (0.98 ft/sec) Berg:  34/56 (Scores <45/56 indicate increased fall risk)                                                                                                                                 TREATMENT DATE: 08/30/2024    PATIENT EDUCATION: Education  details: Eval results, POC, continue HEP from HHPT; verbally added lateral weightshifting to LLE, with attention to light UE support Person educated: Patient Education method: Explanation, Demonstration, and Verbal cues Education comprehension: verbalized understanding, returned demonstration, and needs further education  HOME EXERCISE PROGRAM: Verbally initiated lateral weightshifting; pt doing standing hip kicks, heel/toe raises, marching and sit to stand at home  GOALS: Goals reviewed with patient? Yes  SHORT TERM GOALS: Target date: 10/01/2024  Pt will be independent with HEP for improved gait, stair negotiation, balance. Baseline: no concerns on HEP  09/29/24 Goal status: IN PROGRESS 09/29/24  2.  Pt will improve Berg score to at least 45/56 to decrease fall risk. Baseline: 34/56>44/56 09/27/2024 Goal status: PARTIALLY MET 09/27/2024, MET 10/27/2024  3.  Pt will negotiate curb and 3 steps with cane, mod I, for improved household and community gait. Baseline: stairs with cane with CGA  09/29/24 Goal status: MET, 10/27/2024   LONG TERM GOALS: Target date: 10/29/2024>12/10/2024  Pt will be independent with HEP for improved balance, strength, gait, stairs. Baseline: continuing to update Goal status: IN PROGRESS, 10/27/2024  2.  Pt will improve DGI score to at least 20/24 to decrease fall risk. Baseline: 11/24>16/24 Goal status:IN PROGRESS, 11/19/205  3.  Pt will improve gait velocity to at least 2.6 ft/sec with cane for improved gait efficiency and safety. Baseline: 0.98 ft/sec>2.1 ft/sec with cane 10/27/2024 Goal status: IN PROGRESS, 10/27/2024  4.  Pt will ambulate at least 500 ft, indoor and outdoor gait, mod I with cane for improved community mobility. Baseline: community distances with daughter, per report Goal status: MET, 10/27/2024  5.  Pt will improve TUG score to less than or equal to 13.5 sec without device, for decreased fall risk.  Baseline:  TUG  no cane 17.13  sec 10/27/24  Goal status:  INITIAL  6.  Pt will improve TUG manual score to less than or equal to 15 sec for decreased fall risk.  Baseline:  19.87 sec 10/27/24  Goal status:  INITIAL  7.  Pt will improve gait velocity *with no cane* to at least 2.3 ft/sec for improved gait efficiency and safety.  Baseline:  1.82 ft/sec 10/27/24  Goal status:  INITIAL  8.  Pt will improve 5x sit<>stand to less than or equal to 14.5 sec to demonstrate improved functional strength and transfer efficiency.  Baseline:  16.16 sec 10/27/24  Goal status:  INITIAL    ASSESSMENT:  CLINICAL IMPRESSION: Improved carryover to gait training w/out AD and achieving improved stride length and decrease in double limb support. Difficulty with backwards direction with increased double limb support/short stride length needed. Balance atcivities to improve safety with compliant surfaces, obstacles, and facilitation of righting response from LOB w/ SBA-CGA but able to elicit response albeit delayed.  Strengthening exercise to improve left hip stability and control. Markedly improved single limb support LLE appreciated wih ability to maintain single limb stance on compliant surface OBJECTIVE IMPAIRMENTS: Abnormal gait, decreased balance, decreased mobility, difficulty walking, and decreased strength.   ACTIVITY LIMITATIONS: standing, stairs, transfers, and locomotion level  PARTICIPATION LIMITATIONS: meal prep, cleaning, driving, shopping, community activity, yard work, and church  PERSONAL FACTORS: 3+ comorbidities: see PMH above are also affecting patient's functional outcome.   REHAB POTENTIAL: Good  CLINICAL DECISION MAKING: Evolving/moderate complexity  EVALUATION COMPLEXITY: Moderate  PLAN:  PT FREQUENCY: 2x/week  PT DURATION: 6 weeks per recert 10/27/2024  PLANNED INTERVENTIONS: 97750- Physical Performance Testing, 97110-Therapeutic exercises, 97530- Therapeutic activity, 97112- Neuromuscular re-education,  97535- Self Care, 02859- Manual therapy, [id number]- Gait training, Patient/Family education, Balance training, Stair training, and DME instructions  PLAN FOR NEXT SESSION:  How did walking practice go at home without device?  SLS, glut activation in standing.  Continue weight progression/resistance with standing exercises at counter.  Gait training and balance training in clinic no device as appropriate.    12:51 PM, 12/01/2024 M. Kelly Ezel Vallone, PT, DPT Physical Therapist- [place] Office Number: [phone]    "

## 2024-12-01 NOTE — Patient Instructions (Addendum)
Re/desensitization Techniques Some ways to Re/desensitize a hyper or hyposensitive ie) numb, tingling limb/area is by rubbing it with different textures. This will make your limb more tolerant/aware of touch and pressure. Before you begin, make sure your hands and the materials you're using are clean.  To rub your painful/sensitive area with different textures: Sit in a comfortable position with the numb/painful/sensitive area uncovered. Start with a material that is soft, such as a cotton ball, silk or soft towel. Rub your painful/sensitive area in all directions. Start with a light pressure and gradually increase the pressure. Vary the textures you use, as you can tolerate them. Start with soft materials like cotton balls or a makeup brush. Progress to materials that are rougher. Examples include a paper towel, cloth towel, wool, or velcro. As you progress, gradually increase the pressure and roughness of the texture you use. Be careful not to rub over any incisions or wounds, if you still have staples or sutures in place, or if there are any open areas. Rub your limb for at least 30 seconds progressing up to a minute or two as often as you can tolerate, or as recommended by your healthcare provider. Be careful not to irritate your skin or rub your skin raw.  Stop rubbing your affected area if you notice redness that does not dissipate in 15-30 minutes, bleeding or opening skin, and contact your healthcare provider.  Other methods for re/desensitization include: Allow cool or warm water to run over the area.  Be careful not to get the water too hot, especially if you have decreases sensation of temperature. Putting your affected area into a bowl of dry rice, sand, kidney beans, cold water or warm water. You can hide objects in the dry materials to find. Make a bag of miscellaneous matching objects to feel and match based on feel ie) paper clips, nuts, bolts, dice, marbles, checkers, dominos, beads  etc.  If you choose to start with the method of dipping your affected area into a medium such as rice, sand, or water make sure to move the affected area around in the bowl until you cannot tolerate it or you reach one to two minutes, whichever comes first. You can use a combination of these methods for 10 to 15 minutes, 3-4 times per day.

## 2024-12-07 ENCOUNTER — Ambulatory Visit: Admitting: Occupational Therapy

## 2024-12-07 ENCOUNTER — Other Ambulatory Visit: Payer: Self-pay | Admitting: Family Medicine

## 2024-12-07 ENCOUNTER — Other Ambulatory Visit: Payer: Self-pay

## 2024-12-07 DIAGNOSIS — M6281 Muscle weakness (generalized): Secondary | ICD-10-CM | POA: Diagnosis not present

## 2024-12-07 DIAGNOSIS — F411 Generalized anxiety disorder: Secondary | ICD-10-CM

## 2024-12-07 DIAGNOSIS — R208 Other disturbances of skin sensation: Secondary | ICD-10-CM

## 2024-12-07 DIAGNOSIS — M25632 Stiffness of left wrist, not elsewhere classified: Secondary | ICD-10-CM

## 2024-12-07 DIAGNOSIS — R278 Other lack of coordination: Secondary | ICD-10-CM

## 2024-12-07 MED ORDER — HYDROXYZINE HCL 25 MG PO TABS: 25.0000 mg | ORAL_TABLET | Freq: Every day | ORAL | 1 refills | Status: AC

## 2024-12-07 NOTE — Therapy (Signed)
 " OUTPATIENT OCCUPATIONAL THERAPY ORTHO Treatment Note  Patient Name: Monica Curry MRN: [id number] DOB:[birth year], 89 y.o., female Today's Date: 12/07/2024  PCP: Monica Jenkins Jansky, MD  REFERRING PROVIDER: Arlinda Buster, MD  END OF SESSION:  OT End of Session - 12/07/24 1259     Visit Number 9    Number of Visits 21    Date for Recertification  01/21/25    Authorization Type [employer]    Authorization Time Period Auth#: [id number] , approved 11 OT visits from 10/27/2024 - 01/25/2025    Authorization - Visit Number 8    Authorization - Number of Visits 11    OT Start Time 1234    OT Stop Time 1314    OT Time Calculation (min) 40 min    Equipment Utilized During Treatment velcro board, mini massager    Activity Tolerance Patient tolerated treatment well    Behavior During Therapy WFL for tasks assessed/performed                 Past Medical History:  Diagnosis Date   Allergy    Medications and pollen   Anemia    Anxiety    Always   C. difficile colitis    Cancer (HCC)    Endometrial cancer   Cataract    Had surgery 2021   Chronic kidney disease 2018   2 siblings had kidney failure, sister had transplant   Clotting disorder    Essential and other specified forms of tremor    Hyperlipidemia    Hypertension    Osteoporosis 11/2019   T score -2.8 stable from prior DEXA   Personal history of venous thrombosis and embolism 1997   Pulmonary embolism (HCC) 06/14/2021   Pulmonary embolus (HCC)    Shingles    Stroke (HCC) 2018   TIA   Thrush    TIA (transient ischemic attack)    carotid US  07/2022: no ICA stenosis bilaterally   Past Surgical History:  Procedure Laterality Date   ABDOMINAL HYSTERECTOMY  1997   APPENDECTOMY  1961   cataract surg Bilateral 2021   Dr. Roz, R eye 8/18, L 8/25   CESAREAN SECTION  1961   EYE SURGERY  2021   Cataract both eyes   FRACTURE SURGERY  2025   Broken hip   INTRAMEDULLARY (IM) NAIL INTERTROCHANTERIC Left  04/28/2024   Procedure: FIXATION, FRACTURE, INTERTROCHANTERIC, WITH INTRAMEDULLARY ROD;  Surgeon: Monica Franky SQUIBB, MD;  Location: [facility] OR;  Service: Orthopedics;  Laterality: Left;   TONSILLECTOMY AND ADENOIDECTOMY  1944   TOTAL ABDOMINAL HYSTERECTOMY W/ BILATERAL SALPINGOOPHORECTOMY  1997   Patient Active Problem List   Diagnosis Date Noted   RLS (restless legs syndrome) 06/15/2024   Fracture, Colles, left, closed 04/28/2024   Hip fracture (HCC) 04/27/2024   GAD (generalized anxiety disorder) 04/01/2024   Thyroid  nodule 01/10/2022   Anxiety 08/10/2021   Aortic atherosclerosis (HCC) - CT 06/2021 06/15/2021   Bilateral renal cysts 07/06/2020   Paroxysmal supraventricular tachycardia 10/20/2019   History of skin cancer (basal cell) 06/16/2019   Complete right bundle branch block 06/16/2019   B12 deficiency 06/15/2019   History of TIA (transient ischemic attack) 10/23/2017   Chronic kidney disease, stage 3b (HCC) 07/21/2015   Medication management 07/04/2014   Essential hypertension 11/25/2013   Hyperlipidemia, mixed 11/25/2013   Abnormal glucose 11/25/2013   Vitamin D  deficiency 11/25/2013   Essential tremor 11/25/2013   History of endometrial cancer    Hx/o DVT/PE    Urethral  prolapse    Osteoporosis 10/10/2011    ONSET DATE: referral date 10/07/24 (injury 04/27/24 and [facility] sx 10/12/24)  REFERRING DIAG: G56.02 (ICD-10-CM) - Carpal tunnel syndrome, left upper limb  THERAPY DIAG:  Muscle weakness (generalized)  Stiffness of left wrist, not elsewhere classified  Other lack of coordination  Other disturbances of skin sensation  Rationale for Evaluation and Treatment: Rehabilitation  SUBJECTIVE:   SUBJECTIVE STATEMENT: Pt reports the velcro board being challenging last week.  Pt accompanied by: self  PERTINENT HISTORY:   Per MD note 10/26/24: 89 y.o. female who presents today for follow up 2 weeks status post left wrist open carpal tunnel release.  Doing well overall,  pain controlled, does have persistent numbness and tingling.  Left hand: - Well-healing palmar incision, sutures removed, skin edges well-approximated without erythema or drainage - Composite fist limited secondary to stiffness, improved passively - Sensation remains diminished to light touch in the median nerve distribution - 4/5 APB mild thenar atrophy, thumb opposition to ring finger PIP  PRECAUTIONS: Fall  WEIGHT BEARING RESTRICTIONS: No  PAIN:  Are you having pain? Yes: NPRS scale: minimal Pain location: palm of hand Pain description: twinge Aggravating factors: touching hand around incision Relieving factors: being still  FALLS: Has patient fallen in last 6 months? Yes. Number of falls 1 - fall in May that resulted in hip and wrist fractures.  LIVING ENVIRONMENT: Lives with: lives alone Lives in: House/apartment Stairs: 3 steps to enter, flight of steps to the basement but she does not go down them Has following equipment at home: [name] - 2 wheeled, single point cane, shower chair, and Grab bars  PLOF: Independent with basic ADLs and Requires assistive device for independence; step-daughter is currently doing laundry at her home as pt's washer/dryer are in the basement  PATIENT GOALS: to be able to use Left hand again   NEXT MD VISIT: 11/23/24  OBJECTIVE:  Note: Objective measures were completed at Evaluation unless otherwise noted.  HAND DOMINANCE: Right  ADLs: WFL; difficulty with donning compression sock on L leg but is able to don  FUNCTIONAL OUTCOME MEASURES: Quick Dash: 50%   12/07/24   UPPER EXTREMITY ROM:     Active ROM Right eval Left eval Left 11/22/24 Left 12/07/24  Shoulder flexion      Shoulder abduction      Shoulder adduction      Shoulder extension      Shoulder internal rotation      Shoulder external rotation      Elbow flexion      Elbow extension      Wrist flexion 75 34 30 38  Wrist extension 60 17 40 36  Wrist ulnar  deviation 30 18 22 24   Wrist radial deviation 12 10 8 12   Wrist pronation  WFL    Wrist supination  80%  100%  (Blank rows = not tested)  Loose grasp on L, however all fingers are able to touch palm of hand lightly in open fist, decreased full fist, hook fist, and table top  HAND FUNCTION: Not assessed due to stitches just removed yesterday.  On eval prior to surgery 08/30/24: Right: 40 lbs; Left: 5 lbs  12/07/24: Grip strength:  COORDINATION: 9 Hole Peg test: Right: 30.13 sec; Left: 61.75 sec; Mild B tremors  11/22/24: left: 39.56 sec  12/07/24 Left: 37.21 sec; min-moderate tremors this session  SENSATION: Numbness and tingling in R hand and digits along median nerve distribution  EDEMA: NA  COGNITION:  Overall cognitive status: Within functional limits for tasks assessed   OBSERVATIONS: Pt L wrist at ulnar head and dorsal wrist enlarged compared to R wrist and hand due to bone deformity at dorsal wrist and ulnar styloid s/p setting of fracture back in May 2025. Pt demonstrating ~50% movement in all directions when compared to R, but verbalizing good understanding of exercises.    TREATMENT DATE:  12/08/24 Heat: applied moist heat to L wrist in preparation for ROM exercises.  OT reiterating education on checking skin prior to and post heat for skin integrity.  Completed QuickDASH while heat on L wrist. Re-assessed goals, see above objective and below goals section for further details.  ROM: engaged in AAROM wrist flexion with overpressure with arm positioned on edge of table top to allow gravity to aid in ROM and stretch.  Reiterated use of 1# weight to aid in ROM as well as strengthening of grip. Grip strength: engaged in full grasp on stress ball with hold for 5 seconds with each repetition.  OT encouraged pt to attempt increased resistance theraputty as pt reporting having 2 strengths at home and recommended that she bring it to next session. Hand Gripper: with LUE on level  5# with green spring. Pt picked up 1 inch blocks with gripper with 2 drops and min-mod difficulty.  Pt demonstrating difficulty with task largely due to tremors impacting sustained grasp with movement as well as decreased grip strength even on lowest setting.     12/01/24 - Manual therapy completed for duration as noted below including: Educated pt in sensitization techniques, see Pt instructions for handout provided. Pt confirms she completes most of these already, reports she hasn't done the submerging as much recently. Educated pt in radiation protection practitioner use and benefits of vibration for sensitization purposes, applied mini massager to L hand with individual attention to fingertips. Pt reported that it felt good but that she could not tell much difference in regards to sensation. - Therapeutic exercises completed for duration as noted below including: Checked pt's L supination, WNL. See Goals for updated measurement. With 1# and pillow serving as bolster pt completed 2x10 wrist flexion, wrist extension, radial deviation, and ulnar deviation. - Therapeutic activities completed for duration as noted below including: Pt engaged in resistive wrist flexion, wrist extension, pronation, supination, and individual digit flexion/extension as well as composite digit flexion/extension moving items across velcro board. Pt participated well.  11/29/24 Scar mobilization/massage: OT starting with massage in palm of hand along tendon in 4th digit.  OT providing massage in circular, side to side, and up/down direction around and over incision to increase the range of motion around the scar, especially thenar eminence and into palm of hand.  OT educating on progressive pressure as healing continues and increased massage at incision due to progress in healing. Wrist ROM: engaged in wrist flexion with overpressure x8 with cues to hold position ~5 seconds.  Progressed to circumduction clockwise and counter-clockwise x5 in each  direction.  Pt demonstrating decreased motor control when rotating into flexion and radial deviation both directions. Then engaged in ulnar/radial deviation x10. Strengthening/ROM: Transitioned to [facility] with towel squeezes for strengthening and ringing out towel for wrist flexion/extension. Pt demonstrating difficulty with wrist flexion/extension sequencing, however demonstrating good mobility during functional task.  Pt demonstrating good technique.     PATIENT EDUCATION: Education details: massage, ROM Person educated: Patient Education method: Explanation, Demonstration, Verbal cues, and Handouts Education comprehension: verbalized understanding and needs further education  HOME EXERCISE PROGRAM:  Carpal tunnel release HEP (see pt instructions)  Access Code: [email or web address] URL: [email or web address] Date: 11/10/2024 Prepared by: [facility] - Outpatient  Rehab - [facility]  Exercises - Putty Squeezes  - 2 x daily - 15 reps - 3-Point Pinch with Putty  - 2 x daily - 15 reps - Finger Lumbricals with Putty  - 2 x daily - 15 reps - Seated Wrist Extension Stretch  - 2 x daily - 5-10 reps - Seated Wrist Flexion Stretch  - 2 x daily - 5-10 reps  GOALS: Goals reviewed with patient? Yes  SHORT TERM GOALS: Target date: 12/08/24  Pt will be independent with HEP for ROM and strengthening. Baseline: new to OPOT Goal status: in progress   2.  Pt will verbalize understanding of task modifications and/or potential A/E needs to increase ease, safety, and independence w/ ADLs. Baseline: difficulty with donning compression socks d/t decreased grip and mobility of L wrist/hand Goal status: in progress   3.  Pt will demonstrate improvements in wrist flexion/extension and functional pinch to complete 9 hole peg test with LUE in <55 seconds. Baseline: 61 sec 11/22/24: 39.56 sec Goal status: MET  4.  Pt will verbalize understanding of re-sensitization strategies as well as  compensatory strategies due to impaired sensation.  Baseline: impaired sensation in hand at median nerve distribution  Goal status: in progress    LONG TERM GOALS: Target date: 12/08/24  Pt will demonstrate improved coordination of LUE as evidenced by completing 9 hole peg test in 45 seconds or less as needed for [facility] tasks with ADLs/IADLS. Baseline: 61 12/07/24: 37.21 sec, tremors will impact coordination Goal status: MET  2.  Pt will improve grip strength in left hand to be within at least 20 pounds of her right hand for functional use at home and in IADLs.  Baseline: L: 5# and R: 40# 12/07/24: L: 7.5# Goal status: in progress   3.  Pt will demonstrate improved L wrist flexion/extension to 75% of R wrist flexion/extension for improved functional use.  Baseline: R:75/60 and L: 34/17 12/07/24: L: 38/36 Goal status: in progress   4.  Pt will demonstrate improved L forearm supination to 100% of R forearm supination for improved functional use. Baseline: 80% 12/01/24: 100% Goal status: MET   5.  Pt will demonstrate improvements in pain and functional use of LUE as evidenced by decreased score on QuickDASH from 50% impairement to < 30 % impairment.  Baseline: 50% 12/07/24: 40.9% Goal status: in progress  6.  Pt will verbalize understanding of safe modifications and/or adaptive techniques to complete laundry tasks at Mod I level. Baseline: step-daughter is currently completing due to laundry room being in basement 12/07/24: pt is able to take/transport items up/down stairs to complete laundry in basement Goal status: MET  SHORT TERM GOALS: Target date: 12/31/24  Pt will be independent with HEP for ROM and strengthening. Baseline: new to OPOT Goal status: in progress   2.  Pt will verbalize understanding of task modifications and/or potential A/E needs to increase ease, safety, and independence w/ ADLs. Baseline: difficulty with donning compression socks d/t decreased grip and  mobility of L wrist/hand Goal status: in progress  3.  Pt will verbalize understanding of re-sensitization strategies as well as compensatory strategies due to impaired sensation.  Baseline: impaired sensation in hand at median nerve distribution  Goal status: in progress    LONG TERM GOALS: Target date: 01/21/25  1.  Pt will improve grip strength in left hand  to be within at least 20 pounds of her right hand for functional use at home and in IADLs.  Baseline: L: 5# and R: 40# 12/07/24: L: 7.5# Goal status: in progress   2.  Pt will demonstrate improved L wrist flexion/extension to 75% of R wrist flexion/extension for improved functional use.  Baseline: R:75/60 and L: 34/17 12/07/24: L: 38/36 Goal status: in progress   3.  Pt will demonstrate improvements in pain and functional use of LUE as evidenced by decreased score on QuickDASH from 50% impairement to < 30 % impairment.  Baseline: 50% 12/07/24: 40.9% Goal status: in progress   ASSESSMENT:  CLINICAL IMPRESSION: Patient is a 89 y.o. female who was seen today for occupational therapy treatment s/p L carpal tunnel release. Pt continues to report diminished sensation in LUE but does demonstrate improved ROM in LUE supination and coordination during 9 hole peg test.  Pt reporting improvements in functional use of LUE as evidenced by improved score on QuickDASH. Pt has met 3 of 6 LTGs and would benefit from additional skilled OT services to address ROM, strengthening, and improved functional use of LUE.  PERFORMANCE DEFICITS: in functional skills including ADLs, IADLs, coordination, sensation, edema, ROM, strength, flexibility, Fine motor control, Gross motor control, body mechanics, endurance, decreased knowledge of precautions, and UE functional use and psychosocial skills including routines and behaviors.      PLAN:  OT FREQUENCY: 1-2x/week  OT DURATION: 6 weeks  PLANNED INTERVENTIONS: 97168 OT Re-evaluation, 97535 self  care/ADL training, 02889 therapeutic exercise, 97530 therapeutic activity, 97112 neuromuscular re-education, 97140 manual therapy, 97035 ultrasound, 97018 paraffin, 02239 Orthotic Initial, 97763 Orthotic/Prosthetic subsequent, passive range of motion, compression bandaging, coping strategies training, patient/family education, and DME and/or AE instructions   RECOMMENDED OTHER SERVICES: NA  CONSULTED AND AGREED WITH PLAN OF CARE: Patient  PLAN FOR NEXT SESSION: Continue use of modalities for ROM and pain relief, after 4-6 weeks s/p sx can start light strengthening, progress STGs/LTGs  Referring diagnosis:  G56.02 (ICD-10-CM) - Carpal tunnel syndrome, left upper limb Treatment diagnosis (if different than referring diagnosis):  Muscle weakness (generalized)  Stiffness of left wrist, not elsewhere classified  Other lack of coordination  Other disturbances of skin sensation Date Symptoms Began: 10/27/24 # of Visits requested: 12  Time period for Authorization: 12/07/24 to 01/21/25  What was this (referring dx) caused by? [x]  Surgery []  Fall []  Ongoing issue []  Arthritis []  Other: ____________  Laterality: []  Rt [x]  Lt []  Both  Functional Tool & Score: QuickDASH 40.9%  Check all possible CPT codes:     See Planned Interventions listed in the Plan section of the Evaluation.     If [employer]: Choose 10 or less codes  If [employer]: Modalities are not covered  If [employer]: Check allowed ICD code combinations   If [facility] Plan or [employer]: Cognitive training not covered     KAYLENE DOMINO, OTR/L 12/07/2024, 1:00 PM  [facility] at [facility] [address], [address] [place], [place] [address] Phone # [phone] Fax # [phone]  "

## 2024-12-07 NOTE — Therapy (Incomplete Revision)
 " OUTPATIENT OCCUPATIONAL THERAPY ORTHO Treatment Note  Patient Name: Monica Curry MRN: [id number] DOB:[birth year], 89 y.o., female Today's Date: 12/07/2024  PCP: Monica Jenkins Jansky, MD  REFERRING PROVIDER: Arlinda Buster, MD  END OF SESSION:  OT End of Session - 12/07/24 1259     Visit Number 9    Number of Visits 21    Date for Recertification  01/21/25    Authorization Type [employer]    Authorization Time Period Auth#: [id number] , approved 11 OT visits from 10/27/2024 - 01/25/2025    Authorization - Visit Number 8    Authorization - Number of Visits 11    OT Start Time 1234    OT Stop Time 1314    OT Time Calculation (min) 40 min    Equipment Utilized During Treatment velcro board, mini massager    Activity Tolerance Patient tolerated treatment well    Behavior During Therapy WFL for tasks assessed/performed                 Past Medical History:  Diagnosis Date   Allergy    Medications and pollen   Anemia    Anxiety    Always   C. difficile colitis    Cancer (HCC)    Endometrial cancer   Cataract    Had surgery 2021   Chronic kidney disease 2018   2 siblings had kidney failure, sister had transplant   Clotting disorder    Essential and other specified forms of tremor    Hyperlipidemia    Hypertension    Osteoporosis 11/2019   T score -2.8 stable from prior DEXA   Personal history of venous thrombosis and embolism 1997   Pulmonary embolism (HCC) 06/14/2021   Pulmonary embolus (HCC)    Shingles    Stroke (HCC) 2018   TIA   Thrush    TIA (transient ischemic attack)    carotid US  07/2022: no ICA stenosis bilaterally   Past Surgical History:  Procedure Laterality Date   ABDOMINAL HYSTERECTOMY  1997   APPENDECTOMY  1961   cataract surg Bilateral 2021   Dr. Roz, R eye 8/18, L 8/25   CESAREAN SECTION  1961   EYE SURGERY  2021   Cataract both eyes   FRACTURE SURGERY  2025   Broken hip   INTRAMEDULLARY (IM) NAIL INTERTROCHANTERIC Left  04/28/2024   Procedure: FIXATION, FRACTURE, INTERTROCHANTERIC, WITH INTRAMEDULLARY ROD;  Surgeon: Monica Franky SQUIBB, MD;  Location: [facility] OR;  Service: Orthopedics;  Laterality: Left;   TONSILLECTOMY AND ADENOIDECTOMY  1944   TOTAL ABDOMINAL HYSTERECTOMY W/ BILATERAL SALPINGOOPHORECTOMY  1997   Patient Active Problem List   Diagnosis Date Noted   RLS (restless legs syndrome) 06/15/2024   Fracture, Colles, left, closed 04/28/2024   Hip fracture (HCC) 04/27/2024   GAD (generalized anxiety disorder) 04/01/2024   Thyroid  nodule 01/10/2022   Anxiety 08/10/2021   Aortic atherosclerosis (HCC) - CT 06/2021 06/15/2021   Bilateral renal cysts 07/06/2020   Paroxysmal supraventricular tachycardia 10/20/2019   History of skin cancer (basal cell) 06/16/2019   Complete right bundle branch block 06/16/2019   B12 deficiency 06/15/2019   History of TIA (transient ischemic attack) 10/23/2017   Chronic kidney disease, stage 3b (HCC) 07/21/2015   Medication management 07/04/2014   Essential hypertension 11/25/2013   Hyperlipidemia, mixed 11/25/2013   Abnormal glucose 11/25/2013   Vitamin D  deficiency 11/25/2013   Essential tremor 11/25/2013   History of endometrial cancer    Hx/o DVT/PE    Urethral  prolapse    Osteoporosis 10/10/2011    ONSET DATE: referral date 10/07/24 (injury 04/27/24 and [facility] sx 10/12/24)  REFERRING DIAG: G56.02 (ICD-10-CM) - Carpal tunnel syndrome, left upper limb  THERAPY DIAG:  Muscle weakness (generalized)  Stiffness of left wrist, not elsewhere classified  Other lack of coordination  Other disturbances of skin sensation  Rationale for Evaluation and Treatment: Rehabilitation  SUBJECTIVE:   SUBJECTIVE STATEMENT: Pt reports the velcro board being challenging last week.  Pt accompanied by: self  PERTINENT HISTORY:   Per MD note 10/26/24: 89 y.o. female who presents today for follow up 2 weeks status post left wrist open carpal tunnel release.  Doing well overall,  pain controlled, does have persistent numbness and tingling.  Left hand: - Well-healing palmar incision, sutures removed, skin edges well-approximated without erythema or drainage - Composite fist limited secondary to stiffness, improved passively - Sensation remains diminished to light touch in the median nerve distribution - 4/5 APB mild thenar atrophy, thumb opposition to ring finger PIP  PRECAUTIONS: Fall  WEIGHT BEARING RESTRICTIONS: No  PAIN:  Are you having pain? Yes: NPRS scale: minimal Pain location: palm of hand Pain description: twinge Aggravating factors: touching hand around incision Relieving factors: being still  FALLS: Has patient fallen in last 6 months? Yes. Number of falls 1 - fall in May that resulted in hip and wrist fractures.  LIVING ENVIRONMENT: Lives with: lives alone Lives in: House/apartment Stairs: 3 steps to enter, flight of steps to the basement but she does not go down them Has following equipment at home: [name] - 2 wheeled, single point cane, shower chair, and Grab bars  PLOF: Independent with basic ADLs and Requires assistive device for independence; step-daughter is currently doing laundry at her home as pt's washer/dryer are in the basement  PATIENT GOALS: to be able to use Left hand again   NEXT MD VISIT: 11/23/24  OBJECTIVE:  Note: Objective measures were completed at Evaluation unless otherwise noted.  HAND DOMINANCE: Right  ADLs: WFL; difficulty with donning compression sock on L leg but is able to don  FUNCTIONAL OUTCOME MEASURES: Quick Dash: 50%   12/07/24   UPPER EXTREMITY ROM:     Active ROM Right eval Left eval Left 11/22/24 Left 12/07/24  Shoulder flexion      Shoulder abduction      Shoulder adduction      Shoulder extension      Shoulder internal rotation      Shoulder external rotation      Elbow flexion      Elbow extension      Wrist flexion 75 34 30 38  Wrist extension 60 17 40 36  Wrist ulnar  deviation 30 18 22 24   Wrist radial deviation 12 10 8 12   Wrist pronation  WFL    Wrist supination  80%  100%  (Blank rows = not tested)  Loose grasp on L, however all fingers are able to touch palm of hand lightly in open fist, decreased full fist, hook fist, and table top  HAND FUNCTION: Not assessed due to stitches just removed yesterday.  On eval prior to surgery 08/30/24: Right: 40 lbs; Left: 5 lbs  12/07/24: Grip strength:  COORDINATION: 9 Hole Peg test: Right: 30.13 sec; Left: 61.75 sec; Mild B tremors  11/22/24: left: 39.56 sec  12/07/24 Left: 37.21 sec; min-moderate tremors this session  SENSATION: Numbness and tingling in R hand and digits along median nerve distribution  EDEMA: NA  COGNITION:  Overall cognitive status: Within functional limits for tasks assessed   OBSERVATIONS: Pt L wrist at ulnar head and dorsal wrist enlarged compared to R wrist and hand due to bone deformity at dorsal wrist and ulnar styloid s/p setting of fracture back in May 2025. Pt demonstrating ~50% movement in all directions when compared to R, but verbalizing good understanding of exercises.    TREATMENT DATE:  12/08/24 Heat: applied moist heat to L wrist in preparation for ROM exercises.  OT reiterating education on checking skin prior to and post heat for skin integrity.  Completed QuickDASH while heat on L wrist. Re-assessed goals, see above objective and below goals section for further details.  ROM: engaged in AAROM wrist flexion with overpressure with arm positioned on edge of table top to allow gravity to aid in ROM and stretch.  Reiterated use of 1# weight to aid in ROM as well as strengthening of grip. Grip strength: engaged in full grasp on stress ball with hold for 5 seconds with each repetition.  OT encouraged pt to attempt increased resistance theraputty as pt reporting having 2 strengths at home and recommended that she bring it to next session. Hand Gripper: with LUE on level  5# with green spring. Pt picked up 1 inch blocks with gripper with 2 drops and min-mod difficulty.  Pt demonstrating difficulty with task largely due to tremors impacting sustained grasp with movement as well as decreased grip strength even on lowest setting.     12/01/24 - Manual therapy completed for duration as noted below including: Educated pt in sensitization techniques, see Pt instructions for handout provided. Pt confirms she completes most of these already, reports she hasn't done the submerging as much recently. Educated pt in radiation protection practitioner use and benefits of vibration for sensitization purposes, applied mini massager to L hand with individual attention to fingertips. Pt reported that it felt good but that she could not tell much difference in regards to sensation. - Therapeutic exercises completed for duration as noted below including: Checked pt's L supination, WNL. See Goals for updated measurement. With 1# and pillow serving as bolster pt completed 2x10 wrist flexion, wrist extension, radial deviation, and ulnar deviation. - Therapeutic activities completed for duration as noted below including: Pt engaged in resistive wrist flexion, wrist extension, pronation, supination, and individual digit flexion/extension as well as composite digit flexion/extension moving items across velcro board. Pt participated well.  11/29/24 Scar mobilization/massage: OT starting with massage in palm of hand along tendon in 4th digit.  OT providing massage in circular, side to side, and up/down direction around and over incision to increase the range of motion around the scar, especially thenar eminence and into palm of hand.  OT educating on progressive pressure as healing continues and increased massage at incision due to progress in healing. Wrist ROM: engaged in wrist flexion with overpressure x8 with cues to hold position ~5 seconds.  Progressed to circumduction clockwise and counter-clockwise x5 in each  direction.  Pt demonstrating decreased motor control when rotating into flexion and radial deviation both directions. Then engaged in ulnar/radial deviation x10. Strengthening/ROM: Transitioned to [facility] with towel squeezes for strengthening and ringing out towel for wrist flexion/extension. Pt demonstrating difficulty with wrist flexion/extension sequencing, however demonstrating good mobility during functional task.  Pt demonstrating good technique.     PATIENT EDUCATION: Education details: massage, ROM Person educated: Patient Education method: Explanation, Demonstration, Verbal cues, and Handouts Education comprehension: verbalized understanding and needs further education  HOME EXERCISE PROGRAM:  Carpal tunnel release HEP (see pt instructions)  Access Code: [email or web address] URL: [email or web address] Date: 11/10/2024 Prepared by: [facility] - Outpatient  Rehab - [facility]  Exercises - Putty Squeezes  - 2 x daily - 15 reps - 3-Point Pinch with Putty  - 2 x daily - 15 reps - Finger Lumbricals with Putty  - 2 x daily - 15 reps - Seated Wrist Extension Stretch  - 2 x daily - 5-10 reps - Seated Wrist Flexion Stretch  - 2 x daily - 5-10 reps  GOALS: Goals reviewed with patient? Yes  SHORT TERM GOALS: Target date: 12/08/24  Pt will be independent with HEP for ROM and strengthening. Baseline: new to OPOT Goal status: in progress   2.  Pt will verbalize understanding of task modifications and/or potential A/E needs to increase ease, safety, and independence w/ ADLs. Baseline: difficulty with donning compression socks d/t decreased grip and mobility of L wrist/hand Goal status: in progress   3.  Pt will demonstrate improvements in wrist flexion/extension and functional pinch to complete 9 hole peg test with LUE in <55 seconds. Baseline: 61 sec 11/22/24: 39.56 sec Goal status: MET  4.  Pt will verbalize understanding of re-sensitization strategies as well as  compensatory strategies due to impaired sensation.  Baseline: impaired sensation in hand at median nerve distribution  Goal status: in progress    LONG TERM GOALS: Target date: 12/08/24  Pt will demonstrate improved coordination of LUE as evidenced by completing 9 hole peg test in 45 seconds or less as needed for [facility] tasks with ADLs/IADLS. Baseline: 61 12/07/24: 37.21 sec, tremors will impact coordination Goal status: MET  2.  Pt will improve grip strength in left hand to be within at least 20 pounds of her right hand for functional use at home and in IADLs.  Baseline: L: 5# and R: 40# 12/07/24: L: 7.5# Goal status: in progress   3.  Pt will demonstrate improved L wrist flexion/extension to 75% of R wrist flexion/extension for improved functional use.  Baseline: R:75/60 and L: 34/17 12/07/24: L: 38/36 Goal status: in progress   4.  Pt will demonstrate improved L forearm supination to 100% of R forearm supination for improved functional use. Baseline: 80% 12/01/24: 100% Goal status: MET   5.  Pt will demonstrate improvements in pain and functional use of LUE as evidenced by decreased score on QuickDASH from 50% impairement to < 30 % impairment.  Baseline: 50% 12/07/24: 40.9% Goal status: in progress  6.  Pt will verbalize understanding of safe modifications and/or adaptive techniques to complete laundry tasks at Mod I level. Baseline: step-daughter is currently completing due to laundry room being in basement 12/07/24: pt is able to take/transport items up/down stairs to complete laundry in basement Goal status: MET  SHORT TERM GOALS: Target date: 12/31/24  Pt will be independent with HEP for ROM and strengthening. Baseline: new to OPOT Goal status: in progress   2.  Pt will verbalize understanding of task modifications and/or potential A/E needs to increase ease, safety, and independence w/ ADLs. Baseline: difficulty with donning compression socks d/t decreased grip and  mobility of L wrist/hand Goal status: in progress  3.  Pt will verbalize understanding of re-sensitization strategies as well as compensatory strategies due to impaired sensation.  Baseline: impaired sensation in hand at median nerve distribution  Goal status: in progress    LONG TERM GOALS: Target date: 01/21/25  1.  Pt will improve grip strength in left hand  to be within at least 20 pounds of her right hand for functional use at home and in IADLs.  Baseline: L: 5# and R: 40# 12/07/24: L: 7.5# Goal status: in progress   2.  Pt will demonstrate improved L wrist flexion/extension to 75% of R wrist flexion/extension for improved functional use.  Baseline: R:75/60 and L: 34/17 12/07/24: L: 38/36 Goal status: in progress   3.  Pt will demonstrate improvements in pain and functional use of LUE as evidenced by decreased score on QuickDASH from 50% impairement to < 30 % impairment.  Baseline: 50% 12/07/24: 40.9% Goal status: in progress   ASSESSMENT:  CLINICAL IMPRESSION: Patient is a 89 y.o. female who was seen today for occupational therapy treatment s/p L carpal tunnel release. Pt continues to report diminished sensation in LUE but does demonstrate improved ROM in LUE supination and coordination during 9 hole peg test.  Pt reporting improvements in functional use of LUE as evidenced by improved score on QuickDASH. Pt has met 3 of 6 LTGs and would benefit from additional skilled OT services to address ROM, strengthening, and improved functional use of LUE.  PERFORMANCE DEFICITS: in functional skills including ADLs, IADLs, coordination, sensation, edema, ROM, strength, flexibility, Fine motor control, Gross motor control, body mechanics, endurance, decreased knowledge of precautions, and UE functional use and psychosocial skills including routines and behaviors.      PLAN:  OT FREQUENCY: 1-2x/week  OT DURATION: 6 weeks  PLANNED INTERVENTIONS: 97168 OT Re-evaluation, 97535 self  care/ADL training, 02889 therapeutic exercise, 97530 therapeutic activity, 97112 neuromuscular re-education, 97140 manual therapy, 97035 ultrasound, 97018 paraffin, 02239 Orthotic Initial, 97763 Orthotic/Prosthetic subsequent, passive range of motion, compression bandaging, coping strategies training, patient/family education, and DME and/or AE instructions   RECOMMENDED OTHER SERVICES: NA  CONSULTED AND AGREED WITH PLAN OF CARE: Patient  PLAN FOR NEXT SESSION: Continue use of modalities for ROM and pain relief, after 4-6 weeks s/p sx can start light strengthening, progress STGs/LTGs  Referring diagnosis:  G56.02 (ICD-10-CM) - Carpal tunnel syndrome, left upper limb Treatment diagnosis (if different than referring diagnosis):  Muscle weakness (generalized)  Stiffness of left wrist, not elsewhere classified  Other lack of coordination  Other disturbances of skin sensation Date Symptoms Began: 10/27/24 # of Visits requested: 12  Time period for Authorization: 12/07/24 to 01/21/25  What was this (referring dx) caused by? [x]  Surgery []  Fall []  Ongoing issue []  Arthritis []  Other: ____________  Laterality: []  Rt [x]  Lt []  Both  Functional Tool & Score: QuickDASH 40.9%  Check all possible CPT codes:     See Planned Interventions listed in the Plan section of the Evaluation.     If [employer]: Choose 10 or less codes  If [employer]: Modalities are not covered  If [employer]: Check allowed ICD code combinations   If [facility] Plan or [employer]: Cognitive training not covered     KAYLENE DOMINO, OTR/L 12/07/2024, 1:00 PM  [facility] at [facility] [address], [address] [place], [place] [address] Phone # [phone] Fax # [phone]  "

## 2024-12-07 NOTE — Addendum Note (Signed)
 Addended by: Jaran Sainz M on: 12/07/2024 02:52 PM   Modules accepted: Orders

## 2024-12-10 ENCOUNTER — Ambulatory Visit: Attending: Student | Admitting: Occupational Therapy

## 2024-12-10 DIAGNOSIS — R208 Other disturbances of skin sensation: Secondary | ICD-10-CM | POA: Insufficient documentation

## 2024-12-10 DIAGNOSIS — M25632 Stiffness of left wrist, not elsewhere classified: Secondary | ICD-10-CM | POA: Diagnosis present

## 2024-12-10 DIAGNOSIS — R2681 Unsteadiness on feet: Secondary | ICD-10-CM | POA: Insufficient documentation

## 2024-12-10 DIAGNOSIS — R2689 Other abnormalities of gait and mobility: Secondary | ICD-10-CM | POA: Insufficient documentation

## 2024-12-10 DIAGNOSIS — M6281 Muscle weakness (generalized): Secondary | ICD-10-CM | POA: Diagnosis present

## 2024-12-10 DIAGNOSIS — R278 Other lack of coordination: Secondary | ICD-10-CM | POA: Insufficient documentation

## 2024-12-10 NOTE — Therapy (Signed)
 " OUTPATIENT OCCUPATIONAL THERAPY ORTHO Treatment Note  Patient Name: Monica Curry MRN: [id number] DOB:04-20-1931, 89 y.o., female Today's Date: 12/10/2024  PCP: Wendolyn Jenkins Jansky, MD  REFERRING PROVIDER: Arlinda Buster, MD  END OF SESSION:  OT End of Session - 12/10/24 1022     Visit Number 10    Number of Visits 21    Date for Recertification  01/21/25    Authorization Type [employer]    Authorization Time Period Auth#: [id number] , approved 11 OT visits from 10/27/2024 - 01/25/2025    Authorization - Visit Number 9    Authorization - Number of Visits 11    OT Start Time 0930    OT Stop Time 1015    OT Time Calculation (min) 45 min    Activity Tolerance Patient tolerated treatment well    Behavior During Therapy [facility] for tasks assessed/performed                  Past Medical History:  Diagnosis Date   Allergy    Medications and pollen   Anemia    Anxiety    Always   C. difficile colitis    Cancer (HCC)    Endometrial cancer   Cataract    Had surgery 2021   Chronic kidney disease 2018   2 siblings had kidney failure, sister had transplant   Clotting disorder    Essential and other specified forms of tremor    Hyperlipidemia    Hypertension    Osteoporosis 11/2019   T score -2.8 stable from prior DEXA   Personal history of venous thrombosis and embolism 1997   Pulmonary embolism (HCC) 06/14/2021   Pulmonary embolus (HCC)    Shingles    Stroke (HCC) 2018   TIA   Thrush    TIA (transient ischemic attack)    carotid US  07/2022: no ICA stenosis bilaterally   Past Surgical History:  Procedure Laterality Date   ABDOMINAL HYSTERECTOMY  1997   APPENDECTOMY  1961   cataract surg Bilateral 2021   Dr. Roz, R eye 8/18, L 8/25   CESAREAN SECTION  1961   EYE SURGERY  2021   Cataract both eyes   FRACTURE SURGERY  2025   Broken hip   INTRAMEDULLARY (IM) NAIL INTERTROCHANTERIC Left 04/28/2024   Procedure: FIXATION, FRACTURE, INTERTROCHANTERIC,  WITH INTRAMEDULLARY ROD;  Surgeon: Kendal Franky SQUIBB, MD;  Location: [facility] OR;  Service: Orthopedics;  Laterality: Left;   TONSILLECTOMY AND ADENOIDECTOMY  1944   TOTAL ABDOMINAL HYSTERECTOMY W/ BILATERAL SALPINGOOPHORECTOMY  1997   Patient Active Problem List   Diagnosis Date Noted   RLS (restless legs syndrome) 06/15/2024   Fracture, Colles, left, closed 04/28/2024   Hip fracture (HCC) 04/27/2024   GAD (generalized anxiety disorder) 04/01/2024   Thyroid  nodule 01/10/2022   Anxiety 08/10/2021   Aortic atherosclerosis (HCC) - CT 06/2021 06/15/2021   Bilateral renal cysts 07/06/2020   Paroxysmal supraventricular tachycardia 10/20/2019   History of skin cancer (basal cell) 06/16/2019   Complete right bundle branch block 06/16/2019   B12 deficiency 06/15/2019   History of TIA (transient ischemic attack) 10/23/2017   Chronic kidney disease, stage 3b (HCC) 07/21/2015   Medication management 07/04/2014   Essential hypertension 11/25/2013   Hyperlipidemia, mixed 11/25/2013   Abnormal glucose 11/25/2013   Vitamin D  deficiency 11/25/2013   Essential tremor 11/25/2013   History of endometrial cancer    Hx/o DVT/PE    Urethral prolapse    Osteoporosis 10/10/2011    ONSET  DATE: referral date 10/07/24 (injury 04/27/24 and [facility] sx 10/12/24)  REFERRING DIAG: G56.02 (ICD-10-CM) - Carpal tunnel syndrome, left upper limb  THERAPY DIAG:  Stiffness of left wrist, not elsewhere classified  Other lack of coordination  Other disturbances of skin sensation  Rationale for Evaluation and Treatment: Rehabilitation  SUBJECTIVE:   SUBJECTIVE STATEMENT: Pt reports that she still has numbness in digits 1-3, feels like this is the same as pre-surgery.   Pt accompanied by: self  PERTINENT HISTORY:   Per MD note 10/26/24: 89 y.o. female who presents today for follow up 2 weeks status post left wrist open carpal tunnel release.  Doing well overall, pain controlled, does have persistent numbness and  tingling.  Left hand: - Well-healing palmar incision, sutures removed, skin edges well-approximated without erythema or drainage - Composite fist limited secondary to stiffness, improved passively - Sensation remains diminished to light touch in the median nerve distribution - 4/5 APB mild thenar atrophy, thumb opposition to ring finger PIP  PRECAUTIONS: Fall  WEIGHT BEARING RESTRICTIONS: No  PAIN:  Are you having pain? Yes: NPRS scale: minimal Pain location: palm of hand Pain description: twinge Aggravating factors: touching hand around incision Relieving factors: being still  FALLS: Has patient fallen in last 6 months? Yes. Number of falls 1 - fall in May that resulted in hip and wrist fractures.  LIVING ENVIRONMENT: Lives with: lives alone Lives in: House/apartment Stairs: 3 steps to enter, flight of steps to the basement but she does not go down them Has following equipment at home: [name] - 2 wheeled, single point cane, shower chair, and Grab bars  PLOF: Independent with basic ADLs and Requires assistive device for independence; step-daughter is currently doing laundry at her home as pt's washer/dryer are in the basement  PATIENT GOALS: to be able to use Left hand again   NEXT MD VISIT: 11/23/24  OBJECTIVE:  Note: Objective measures were completed at Evaluation unless otherwise noted.  HAND DOMINANCE: Right  ADLs: WFL; difficulty with donning compression sock on L leg but is able to don  FUNCTIONAL OUTCOME MEASURES: Quick Dash: 50%   12/07/24   UPPER EXTREMITY ROM:     Active ROM Right eval Left eval Left 11/22/24 Left 12/07/24  Shoulder flexion      Shoulder abduction      Shoulder adduction      Shoulder extension      Shoulder internal rotation      Shoulder external rotation      Elbow flexion      Elbow extension      Wrist flexion 75 34 30 38  Wrist extension 60 17 40 36  Wrist ulnar deviation 30 18 22 24   Wrist radial deviation 12 10 8  12   Wrist pronation  WFL    Wrist supination  80%  100%  (Blank rows = not tested)  Loose grasp on L, however all fingers are able to touch palm of hand lightly in open fist, decreased full fist, hook fist, and table top  HAND FUNCTION: Not assessed due to stitches just removed yesterday.  On eval prior to surgery 08/30/24: Right: 40 lbs; Left: 5 lbs  12/07/24: Grip strength:  COORDINATION: 9 Hole Peg test: Right: 30.13 sec; Left: 61.75 sec; Mild B tremors  11/22/24: left: 39.56 sec  12/07/24 Left: 37.21 sec; min-moderate tremors this session  SENSATION: Numbness and tingling in L hand and digits along median nerve distribution  EDEMA: NA  COGNITION: Overall cognitive status: Within  functional limits for tasks assessed   OBSERVATIONS: Pt L wrist at ulnar head and dorsal wrist enlarged compared to R wrist and hand due to bone deformity at dorsal wrist and ulnar styloid s/p setting of fracture back in May 2025. Pt demonstrating ~50% movement in all directions when compared to R, but verbalizing good understanding of exercises.    TREATMENT DATE:  12/10/24 Heat applied as preparatory activity with skin in tact pre and post treatment prior to ROM and strengthening. Education for joint protection during moist heat. OT performing soft tissue massage/scar massage briefly for L wrist with no c/o pain. Guided through tendon glides for L hand, OT demonstrating and assisting with form and technique.  AAROM for L wrist for ulnar/radial deviation, wrist flex/ext, circumduction before upgrading to mild resistance strengthening with 1# and upgraded to 2# handweight for the above movements with towel under forearm for support in table top, mild overpressure as able to tolerate.  Grip strengthening: light blue resistance gripper for composite grip for 1x10 with 3 second hold. Educated on sensitization techniques and brain/body connection, pt not really interested in textures or sensitization  techniques this date.   12/08/24 Heat: applied moist heat to L wrist in preparation for ROM exercises.  OT reiterating education on checking skin prior to and post heat for skin integrity.  Completed QuickDASH while heat on L wrist. Re-assessed goals, see above objective and below goals section for further details.  ROM: engaged in AAROM wrist flexion with overpressure with arm positioned on edge of table top to allow gravity to aid in ROM and stretch.  Reiterated use of 1# weight to aid in ROM as well as strengthening of grip. Grip strength: engaged in full grasp on stress ball with hold for 5 seconds with each repetition.  OT encouraged pt to attempt increased resistance theraputty as pt reporting having 2 strengths at home and recommended that she bring it to next session. Hand Gripper: with LUE on level 5# with green spring. Pt picked up 1 inch blocks with gripper with 2 drops and min-mod difficulty.  Pt demonstrating difficulty with task largely due to tremors impacting sustained grasp with movement as well as decreased grip strength even on lowest setting.     12/01/24 - Manual therapy completed for duration as noted below including: Educated pt in sensitization techniques, see Pt instructions for handout provided. Pt confirms she completes most of these already, reports she hasn't done the submerging as much recently. Educated pt in radiation protection practitioner use and benefits of vibration for sensitization purposes, applied mini massager to L hand with individual attention to fingertips. Pt reported that it felt good but that she could not tell much difference in regards to sensation. - Therapeutic exercises completed for duration as noted below including: Checked pt's L supination, WNL. See Goals for updated measurement. With 1# and pillow serving as bolster pt completed 2x10 wrist flexion, wrist extension, radial deviation, and ulnar deviation. - Therapeutic activities completed for duration as noted  below including: Pt engaged in resistive wrist flexion, wrist extension, pronation, supination, and individual digit flexion/extension as well as composite digit flexion/extension moving items across velcro board. Pt participated well.  11/29/24 Scar mobilization/massage: OT starting with massage in palm of hand along tendon in 4th digit.  OT providing massage in circular, side to side, and up/down direction around and over incision to increase the range of motion around the scar, especially thenar eminence and into palm of hand.  OT educating on progressive  pressure as healing continues and increased massage at incision due to progress in healing. Wrist ROM: engaged in wrist flexion with overpressure x8 with cues to hold position ~5 seconds.  Progressed to circumduction clockwise and counter-clockwise x5 in each direction.  Pt demonstrating decreased motor control when rotating into flexion and radial deviation both directions. Then engaged in ulnar/radial deviation x10. Strengthening/ROM: Transitioned to [facility] with towel squeezes for strengthening and ringing out towel for wrist flexion/extension. Pt demonstrating difficulty with wrist flexion/extension sequencing, however demonstrating good mobility during functional task.  Pt demonstrating good technique.     PATIENT EDUCATION: Education details: massage, ROM Person educated: Patient Education method: Explanation, Demonstration, Verbal cues, and Handouts Education comprehension: verbalized understanding and needs further education  HOME EXERCISE PROGRAM: Carpal tunnel release HEP (see pt instructions)  Access Code: [email or web address] URL: [email or web address] Date: 11/10/2024 Prepared by: [facility]patient  Rehab - [facility]  Exercises - Putty Squeezes  - 2 x daily - 15 reps - 3-Point Pinch with Putty  - 2 x daily - 15 reps - Finger Lumbricals with Putty  - 2 x daily - 15 reps - Seated Wrist Extension Stretch  - 2 x  daily - 5-10 reps - Seated Wrist Flexion Stretch  - 2 x daily - 5-10 reps  GOALS: Goals reviewed with patient? Yes   SHORT TERM GOALS: Target date: 12/31/24  Pt will be independent with HEP for ROM and strengthening. Baseline: new to OPOT Goal status: in progress   2.  Pt will verbalize understanding of task modifications and/or potential A/E needs to increase ease, safety, and independence w/ ADLs. Baseline: difficulty with donning compression socks d/t decreased grip and mobility of L wrist/hand Goal status: in progress  3.  Pt will verbalize understanding of re-sensitization strategies as well as compensatory strategies due to impaired sensation.  Baseline: impaired sensation in hand at median nerve distribution  Goal status: in progress    LONG TERM GOALS: Target date: 01/21/25  1.  Pt will improve grip strength in left hand to be within at least 20 pounds of her right hand for functional use at home and in IADLs.  Baseline: L: 5# and R: 40# 12/07/24: L: 7.5# Goal status: in progress   2.  Pt will demonstrate improved L wrist flexion/extension to 75% of R wrist flexion/extension for improved functional use.  Baseline: R:75/60 and L: 34/17 12/07/24: L: 38/36 Goal status: in progress   3.  Pt will demonstrate improvements in pain and functional use of LUE as evidenced by decreased score on QuickDASH from 50% impairement to < 30 % impairment.  Baseline: 50% 12/07/24: 40.9% Goal status: in progress   ASSESSMENT:  CLINICAL IMPRESSION: Patient is a 89 y.o. female who was seen today for occupational therapy treatment s/p L carpal tunnel release. Pt continues to report decreased sensation in LUE but does not report pain except when accidentally hitting surgical site but is able to attempt tasks this session with no pain. Pt would benefit from additional skilled OT services to address ROM, strengthening, and improved functional use of LUE.  PERFORMANCE DEFICITS: in functional  skills including ADLs, IADLs, coordination, sensation, edema, ROM, strength, flexibility, Fine motor control, Gross motor control, body mechanics, endurance, decreased knowledge of precautions, and UE functional use and psychosocial skills including routines and behaviors.      PLAN:  OT FREQUENCY: 1-2x/week  OT DURATION: 6 weeks  PLANNED INTERVENTIONS: 97168 OT Re-evaluation, 97535 self care/ADL training, 02889 therapeutic exercise, 97530 therapeutic  activity, 97112 neuromuscular re-education, 97140 manual therapy, 97035 ultrasound, 02981 paraffin, 97760 Orthotic Initial, 97763 Orthotic/Prosthetic subsequent, passive range of motion, compression bandaging, coping strategies training, patient/family education, and DME and/or AE instructions   RECOMMENDED OTHER SERVICES: NA  CONSULTED AND AGREED WITH PLAN OF CARE: Patient  PLAN FOR NEXT SESSION: Continue use of modalities for ROM and pain relief, after 4-6 weeks s/p sx can start light strengthening, progress STGs/LTGs  Referring diagnosis:  G56.02 (ICD-10-CM) - Carpal tunnel syndrome, left upper limb Treatment diagnosis (if different than referring diagnosis):  Muscle weakness (generalized)  Stiffness of left wrist, not elsewhere classified  Other lack of coordination  Other disturbances of skin sensation Date Symptoms Began: 10/27/24 # of Visits requested: 12  Time period for Authorization: 12/07/24 to 01/21/25  What was this (referring dx) caused by? [x]  Surgery []  Fall []  Ongoing issue []  Arthritis []  Other: ____________  Laterality: []  Rt [x]  Lt []  Both  Functional Tool & Score: QuickDASH 40.9%  Check all possible CPT codes:     See Planned Interventions listed in the Plan section of the Evaluation.     If [employer]: Choose 10 or less codes  If [employer]: Modalities are not covered  If [employer]: Check allowed ICD code combinations   If [facility] Plan or [employer]: Cognitive training not  covered     Chiquita JAYSON Hopping, OTR/L 12/10/2024, 10:25 AM  [facility] at [facility] [address] [place], [place] [address] Phone # [phone] Fax # [phone]  "

## 2024-12-13 ENCOUNTER — Encounter: Payer: Self-pay | Admitting: Family Medicine

## 2024-12-13 ENCOUNTER — Ambulatory Visit: Admitting: Physical Therapy

## 2024-12-13 ENCOUNTER — Ambulatory Visit: Admitting: Occupational Therapy

## 2024-12-13 ENCOUNTER — Ambulatory Visit: Admitting: Family Medicine

## 2024-12-13 VITALS — BP 160/80 | HR 72 | Temp 97.2°F | Resp 98 | Ht 63.5 in | Wt 135.2 lb

## 2024-12-13 DIAGNOSIS — M8000XD Age-related osteoporosis with current pathological fracture, unspecified site, subsequent encounter for fracture with routine healing: Secondary | ICD-10-CM

## 2024-12-13 DIAGNOSIS — M6281 Muscle weakness (generalized): Secondary | ICD-10-CM

## 2024-12-13 DIAGNOSIS — M25632 Stiffness of left wrist, not elsewhere classified: Secondary | ICD-10-CM | POA: Diagnosis not present

## 2024-12-13 DIAGNOSIS — J90 Pleural effusion, not elsewhere classified: Secondary | ICD-10-CM | POA: Diagnosis not present

## 2024-12-13 DIAGNOSIS — M810 Age-related osteoporosis without current pathological fracture: Secondary | ICD-10-CM

## 2024-12-13 DIAGNOSIS — I10 Essential (primary) hypertension: Secondary | ICD-10-CM | POA: Diagnosis not present

## 2024-12-13 DIAGNOSIS — R2689 Other abnormalities of gait and mobility: Secondary | ICD-10-CM

## 2024-12-13 DIAGNOSIS — R2681 Unsteadiness on feet: Secondary | ICD-10-CM

## 2024-12-13 LAB — BASIC METABOLIC PANEL WITH GFR
BUN: 28 mg/dL — ABNORMAL HIGH (ref 6–23)
CO2: 28 meq/L (ref 19–32)
Calcium: 9.1 mg/dL (ref 8.4–10.5)
Chloride: 105 meq/L (ref 96–112)
Creatinine, Ser: 1.03 mg/dL (ref 0.40–1.20)
GFR: 47.58 mL/min — ABNORMAL LOW
Glucose, Bld: 113 mg/dL — ABNORMAL HIGH (ref 70–99)
Potassium: 4.1 meq/L (ref 3.5–5.1)
Sodium: 139 meq/L (ref 135–145)

## 2024-12-13 MED ORDER — PROPRANOLOL HCL ER 160 MG PO CP24: 160.0000 mg | ORAL_CAPSULE | Freq: Every day | ORAL | 1 refills | Status: AC

## 2024-12-13 MED ORDER — OLMESARTAN MEDOXOMIL 20 MG PO TABS: 20.0000 mg | ORAL_TABLET | Freq: Every day | ORAL | 1 refills | Status: AC

## 2024-12-13 NOTE — Therapy (Signed)
 " OUTPATIENT PHYSICAL THERAPY NEURO TREATMENT NOTE/RECERT  Patient Name: Monica Curry MRN: [id number] DOB:1931/07/02, 89 y.o., female Today's Date: 12/13/2024   PCP: Wendolyn Jenkins Jansky, MD REFERRING PROVIDER: Danton Lauraine LABOR, PA-C       END OF SESSION:  PT End of Session - 12/13/24 1022     Visit Number 23    Number of Visits 30    Date for Recertification  01/07/25    Authorization Type [employer]-reauth submitted 12/13/2024    Authorization Time Period 12 visits approved from 11/24 to 12/10/24    Authorization - Number of Visits --    Progress Note Due on Visit 30    PT Start Time 1019    PT Stop Time 1058    PT Time Calculation (min) 39 min    Equipment Utilized During Treatment Gait belt    Activity Tolerance Patient tolerated treatment well    Behavior During Therapy WFL for tasks assessed/performed                          Past Medical History:  Diagnosis Date   Allergy    Medications and pollen   Anemia    Anxiety    Always   C. difficile colitis    Cancer (HCC)    Endometrial cancer   Cataract    Had surgery 2021   Chronic kidney disease 2018   2 siblings had kidney failure, sister had transplant   Clotting disorder    Essential and other specified forms of tremor    Hyperlipidemia    Hypertension    Osteoporosis 11/2019   T score -2.8 stable from prior DEXA   Personal history of venous thrombosis and embolism 1997   Pulmonary embolism (HCC) 06/14/2021   Pulmonary embolus (HCC)    Shingles    Stroke (HCC) 2018   TIA   Thrush    TIA (transient ischemic attack)    carotid US  07/2022: no ICA stenosis bilaterally   Past Surgical History:  Procedure Laterality Date   ABDOMINAL HYSTERECTOMY  1997   APPENDECTOMY  1961   cataract surg Bilateral 2021   Dr. Roz, R eye 8/18, L 8/25   CESAREAN SECTION  1961   EYE SURGERY  2021   Cataract both eyes   FRACTURE SURGERY  2025   Broken hip   INTRAMEDULLARY (IM) NAIL INTERTROCHANTERIC  Left 04/28/2024   Procedure: FIXATION, FRACTURE, INTERTROCHANTERIC, WITH INTRAMEDULLARY ROD;  Surgeon: Kendal Franky SQUIBB, MD;  Location: [facility] OR;  Service: Orthopedics;  Laterality: Left;   TONSILLECTOMY AND ADENOIDECTOMY  1944   TOTAL ABDOMINAL HYSTERECTOMY W/ BILATERAL SALPINGOOPHORECTOMY  1997   Patient Active Problem List   Diagnosis Date Noted   RLS (restless legs syndrome) 06/15/2024   Fracture, Colles, left, closed 04/28/2024   Hip fracture (HCC) 04/27/2024   GAD (generalized anxiety disorder) 04/01/2024   Thyroid  nodule 01/10/2022   Anxiety 08/10/2021   Aortic atherosclerosis (HCC) - CT 06/2021 06/15/2021   Bilateral renal cysts 07/06/2020   Paroxysmal supraventricular tachycardia 10/20/2019   History of skin cancer (basal cell) 06/16/2019   Complete right bundle branch block 06/16/2019   B12 deficiency 06/15/2019   History of TIA (transient ischemic attack) 10/23/2017   Chronic kidney disease, stage 3b (HCC) 07/21/2015   Medication management 07/04/2014   Essential hypertension 11/25/2013   Hyperlipidemia, mixed 11/25/2013   Abnormal glucose 11/25/2013   Vitamin D  deficiency 11/25/2013   Essential tremor 11/25/2013   History  of endometrial cancer    Hx/o DVT/PE    Urethral prolapse    Osteoporosis 10/10/2011    ONSET DATE: 04/28/2024  REFERRING DIAG: S/P L intertrachanteric femur fracture, Left distal radius fracture 04/28/2024   THERAPY DIAG:  Muscle weakness (generalized)  Other abnormalities of gait and mobility  Unsteadiness on feet  Rationale for Evaluation and Treatment: Rehabilitation  SUBJECTIVE:                                                                                                                                                                                             SUBJECTIVE STATEMENT: I have gotten better about walking around my house without the cane, just not outside yet.  Walking down the stairs to do the laundry and I'm back to  driving.    Pt accompanied by: self  PERTINENT HISTORY: osteoporosis, hx of DVT; see additional PMH above  PAIN:  Are you having pain? No   PRECAUTIONS: Fall, wears L compression stocking due to L LE edema (was negative for DVT)  **CTR surgery LUE 10/12/24:  wearing bandage for 5 days, no lifting, to return to MD 11/18 for removal of stitches, follow up  RED FLAGS: None   WEIGHT BEARING RESTRICTIONS: No  FALLS: Has patient fallen in last 6 months? Yes. Number of falls 1  LIVING ENVIRONMENT: Lives with: lives alone Lives in: House/apartment Stairs: 3 steps to enter home;  Has following equipment at home: Single point cane and Walker - 2 wheeled *cane has tripod base and has gotten quad tip (per 10/28 visit) PLOF: Independent  PATIENT GOALS: To be able to transition to the cane, so I can get in and out of home better. 12/13/24:  To be able to maneuver better while carrying things.    OBJECTIVE:    TODAY'S TREATMENT: 12/13/2024 Activity Comments  FTSTS:  18.06 sec arms crossed at chest Compared to 16 sec  TUG  15.5 sec Compared to 17 sec  TUG manual:  17 sec Compared to 19.87 sec  DGI;  18/24 Improved from 16/24  10 M walk:  13.08 sec  (2.51 ft/sec)   Resisted sidestep/together 2 x 10, then x 10 over obstacle Forward/back stepping over obstacle, 4# 4#    [facility] PT Assessment - 12/13/24 1036       Dynamic Gait Index   Level Surface Mild Impairment   8.37   Change in Gait Speed Normal    Gait with Horizontal Head Turns Mild Impairment   8.44 sec   Gait with Vertical Head Turns Mild Impairment   9.25   Gait and Pivot Turn Normal  Step Over Obstacle Mild Impairment    Step Around Obstacles Mild Impairment    Steps Mild Impairment    Total Score 18    DGI comment: improved from 16/24              HOME EXERCISE PROGRAM: Access Code: [email or web address] URL: [email or web address] Date: 11/01/2024 Prepared by: [facility]  Program Notes Marching in place, side hip kicks, back hip kicks:  Do 3 sets of 10 with 2# weight  Exercises - Backward Walking with Counter Support  - 1 x daily - 5 x weekly - 1 sets - 5 reps - Alternating Step Taps with Counter Support  - 1 x daily - 5 x weekly - 3 sets - 10 reps - Side Stepping with Counter Support  - 1 x daily - 5 x weekly - 1 sets - 5 reps - Corner Balance Feet Together With Eyes Closed  - 1 x daily - 7 x weekly - 3 reps - 30 sec hold - Corner Balance Feet Together: Eyes Open With Head Turns  - 1 x daily - 7 x weekly - 3 reps - 30 sec hold - Corner Balance Feet Together: Eyes Closed With Head Turns  - 1 x daily - 7 x weekly - 3 reps - 30 sec hold - Semi-Tandem Corner Balance With Eyes Open  - 1 x daily - 7 x weekly - 3 reps - 15-30 sec hold - Supine Bridge  - 1 x daily - 3-4 x weekly - 3 sets - 10 reps - Hooklying Isometric Clamshell  - 1 x daily - 3-4 x weekly - 3 sets - 10 reps - Sit to Stand with Resistance Around Legs  - 1 x daily - 4 x weekly - 2 sets - 10 reps - Seated Hamstring Stretch  - 1 x daily - 7 x weekly - 1 sets - 3 reps - 30 sec hold - Sidelying Hip Abduction  - 1 x daily - 2-3 x weekly - 3 sets - 10 reps       PATIENT EDUCATION: Education details: 12/13/2024:  Progress towards goals, POC; continued use of cane for outdoor and community surfaces/distance; pt discussed keeping hemiwalker in basement for use down there-discussed perhaps another cane down there instead, as hemiwalker may be quite bulky for her at this time and would not want to contribute to her being off balance Person educated: Patient Education method: Explanation, Demonstration, and Verbal cues Education comprehension: verbalized understanding and returned demonstration      -------------------------------------------- Note: Objective measures were completed at Evaluation unless otherwise noted.  DIAGNOSTIC FINDINGS: Interval proximal left femoral cephalomedullary nail  fixation of the previously seen intertrochanteric fracture. Improved alignment.  COGNITION: Overall cognitive status: Within functional limits for tasks assessed   SENSATION: Reports decreased sensation in L fingers (to see hand surgeon)  EDEMA:  Noted LLE; wearing compression stocking  POSTURE: rounded shoulders and increased thoracic kyphosis  LOWER EXTREMITY ROM:     Active  Right Eval Left Eval  Hip flexion    Hip extension    Hip abduction    Hip adduction    Hip internal rotation    Hip external rotation    Knee flexion    Knee extension    Ankle dorsiflexion  10  Ankle plantarflexion  40  Ankle inversion    Ankle eversion     (Blank rows = not tested)  LOWER EXTREMITY MMT:  MMT Right Eval Left Eval  Hip flexion 4 3+  Hip extension    Hip abduction 5 4  Hip adduction 5 4  Hip internal rotation    Hip external rotation    Knee flexion 4+ 4+  Knee extension 4+ 4  Ankle dorsiflexion 4+ 4  Ankle plantarflexion    Ankle inversion    Ankle eversion    (Blank rows = not tested)  TRANSFERS: Sit to stand: Modified independence  Assistive device utilized: None     Stand to sit: Modified independence  Assistive device utilized: hands at chair      STAIRS: Findings: Level of Assistance: Modified independence, Stair Negotiation Technique: Step to Pattern with Single Rail on Right, Number of Stairs: 2-3, Height of Stairs: 4-6   , and Comments: prior to fracture, pt negotiated steps alternating pattern GAIT: Findings: Gait Characteristics: step through pattern, decreased step length- Right, and decreased stance time- Left, Distance walked: 50 ft x 2, Assistive device utilized:Walker - 2 wheeled and SPC with small quad tip base, Level of assistance: Modified independence, CGA, and Min A, and Comments: Mod I with RW, CGA/min assist with cane  FUNCTIONAL TESTS:  10 meter walk test: 16.78 sec RW (1.95 ft/sec); 33.62 sec with cane (0.98 ft/sec) Lars:  34/56 (Scores  <45/56 indicate increased fall risk)   [facility] PT Assessment - 12/13/24 1036       Dynamic Gait Index   Level Surface Mild Impairment   8.37   Change in Gait Speed Normal    Gait with Horizontal Head Turns Mild Impairment   8.44 sec   Gait with Vertical Head Turns Mild Impairment   9.25   Gait and Pivot Turn Normal    Step Over Obstacle Mild Impairment    Step Around Obstacles Mild Impairment    Steps Mild Impairment    Total Score 18    DGI comment: improved from 16/24                                                                                                                                       TREATMENT DATE: 08/30/2024    PATIENT EDUCATION: Education details: Eval results, POC, continue HEP from HHPT; verbally added lateral weightshifting to LLE, with attention to light UE support Person educated: Patient Education method: Explanation, Demonstration, and Verbal cues Education comprehension: verbalized understanding, returned demonstration, and needs further education  HOME EXERCISE PROGRAM: Verbally initiated lateral weightshifting; pt doing standing hip kicks, heel/toe raises, marching and sit to stand at home  GOALS: Goals reviewed with patient? Yes  SHORT TERM GOALS: Target date: 10/01/2024  Pt will be independent with HEP for improved gait, stair negotiation, balance. Baseline: no concerns on HEP  09/29/24 Goal status: IN PROGRESS 09/29/24  2.  Pt will improve Berg score to at least 45/56 to decrease fall risk. Baseline: 34/56>44/56 09/27/2024 Goal status: PARTIALLY MET 09/27/2024, MET 10/27/2024  3.  Pt will negotiate curb and 3 steps with cane, mod I, for improved household and community gait. Baseline: stairs with cane with CGA  09/29/24 Goal status: MET, 10/27/2024   LONG TERM GOALS: Target date: 10/29/2024>12/10/2024>UPDATED TARGET 01/07/2025  Pt will be independent with HEP for improved balance, strength, gait, stairs. Baseline: continuing to  update Goal status: IN PROGRESS, 10/27/2024  2.  Pt will improve DGI score to at least 20/24 to decrease fall risk. Baseline: 11/24>16/24>18/24 12/13/24 Goal status:IN PROGRESS, 12/13/24  3.  Pt will improve gait velocity to at least 2.6 ft/sec with cane for improved gait efficiency and safety. Baseline: 0.98 ft/sec>2.1 ft/sec with cane 10/27/2024 (2.5 ft/sec no cane 12/13/2024 Goal status: IN PROGRESS, 12/13/2024  4.  Pt will ambulate at least 500 ft, indoor and outdoor gait, mod I with cane for improved community mobility. Baseline: community distances with daughter, per report Goal status: MET, 10/27/2024  5.  Pt will improve TUG score to less than or equal to 13.5 sec without device, for decreased fall risk.  Baseline:  TUG no cane 17.13 sec 10/27/24>15.5 sec 12/13/2024  Goal status:  IN PROGRESS 12/13/24  6.  Pt will improve TUG manual score to less than or equal to 15 sec for decreased fall risk.  Baseline:  19.87 sec 10/27/24; 17 sec 12/13/24  Goal status:  IN PROGRESS  7.  Pt will improve gait velocity *with no cane* to at least 2.3 ft/sec for improved gait efficiency and safety.  Baseline:  1.82 ft/sec 10/27/24>2.51 ft/sec 12/13/2024  Goal status:  MET, 12/13/2024  8.  Pt will improve 5x sit<>stand to less than or equal to 14.5 sec to demonstrate improved functional strength and transfer efficiency.  Baseline:  16.16 sec 10/27/24, 18.06 sec 12/13/24  Goal status:  IN PROGRESS    ASSESSMENT:  CLINICAL IMPRESSION: Pt presents today with no new complaints.  She notes improvements in more walking around home without cane, improved ability to go up and down steps to basement laundry and being able to drive.  She does still note some difficulty with initiating gait due to L hip stiffness, weakness.  Assessed LTGs today, with pt meeting LTG 7 for gait velocity improvement with no cane-improved to 2.51 ft/sec.  She has made slight improvements in TUG, TUG manual, DGI scores, just not to goal level.   FTSTS score (with no hands) is slightly increased in time since last check.  Of note, she missed several visits and did not meet frequency of POC over the past several weeks due to the holidays and scheduling conflicts.  She is making steady (but slightly slowed) progress towards LTGs and they remain appropriate.  Due to pt continueing to be at fall risk per DGI, TUG/TUG manual and FTSTS scores, pt will continue to benefit from skilled PT towards goals for improved functional mobility and decreased fall risk.  OBJECTIVE IMPAIRMENTS: Abnormal gait, decreased balance, decreased mobility, difficulty walking, and decreased strength.   ACTIVITY LIMITATIONS: standing, stairs, transfers, and locomotion level  PARTICIPATION LIMITATIONS: meal prep, cleaning, driving, shopping, community activity, yard work, and church  PERSONAL FACTORS: 3+ comorbidities: see PMH above are also affecting patient's functional outcome.   REHAB POTENTIAL: Good  CLINICAL DECISION MAKING: Evolving/moderate complexity  EVALUATION COMPLEXITY: Moderate  PLAN:  PT FREQUENCY: 2x/week  PT DURATION: 6 weeks per recert 10/27/2024  PLANNED INTERVENTIONS: 97750- Physical Performance Testing, 97110-Therapeutic exercises, 97530- Therapeutic activity, [id number]- Neuromuscular re-education, 97535- Self Care, [id number]- Manual therapy, [phone]- Gait training, Patient/Family education,  Balance training, Stair training, and DME instructions  PLAN FOR NEXT SESSION:  SLS, glut activation in standing.  Continue weight progression/resistance with standing exercises at counter; progression of HEP.  Gait training and balance training in clinic no device as appropriate.    Greig Anon, PT 12/13/2024 12:40 PM Phone: [phone] Fax: [phone]  [facility] [address], [address] [place], [place] [address] Phone # [phone] Fax # [phone]  Referring diagnosis:  S/P L intertrachanteric femur  fracture, Left distal radius fracture 04/28/2024 Treatment diagnosis (if different than referring diagnosis): R26.89, R26.81, M62.81 Date Symptoms Began: 04/28/2024 # of Visits requested: 8  Time period for Authorization: 12/13/2024 to 01/07/2025  What was this (referring dx) caused by? []  Surgery [x]  Fall []  Ongoing issue []  Arthritis []  Other: ____________  Laterality: []  Rt [x]  Lt []  Both  Functional Tool & Score: TUG 15.5 sec, TUG manual 17 sec, DGI 18/24; gait velocity no device 2.51 ft/sec, FTSTS:  18.06 sec  Check all possible CPT codes:     See Planned Interventions listed in the Plan section of the Evaluation.     If [employer]: Choose 10 or less codes  If [employer]: Modalities are not covered  If [employer]: Check allowed ICD code combinations   If [facility] Plan or [employer]: Cognitive training not covered   "

## 2024-12-13 NOTE — Patient Instructions (Addendum)
 Increase olmesartan  to whole tab of 20mg -wait on the increase of olmesartan  until we see how you are doing with the propranolol   Change batteries  Change short acting propranolol  to long acting once/day

## 2024-12-13 NOTE — Progress Notes (Signed)
 "  Subjective:     Patient ID: Monica Curry, female    DOB: April 05, 1931, 89 y.o.   MRN: [id number]  Chief Complaint  Patient presents with   Hypertension    Pt is here for followup    Discussed the use of AI scribe software for clinical note transcription with the patient, who gave verbal consent to proceed.  History of Present Illness Monica Curry is a 89 year old female with hypertension and history of pulmonary embolism who presents with concerns about blood pressure management and pleural effusions.  She has been experiencing ongoing issues with blood pressure management, with home readings ranging from 166/91 to 188/90. During a recent surgery, her blood pressure exceeded 200/110. She is currently taking propranolol  40 mg three times a day- 4x/day and olmesartan  10 mg daily, having adjusted to a half tablet of olmesartan  for about a month.  She has a history of pulmonary embolism, first occurring post-hysterectomy in 2019 and again in 2022. She attributes scarring and pleural effusions noted in past imaging to these embolisms. She experiences occasional shortness of breath, particularly when climbing stairs, but it does not significantly limit her daily activities. She is on Eliquis  5 mg twice daily for anticoagulation and atorvastatin  40 mg for cholesterol management. She recalls having an echocardiogram in 2022 but does not remember any specific follow-up or treatment related to its findings.  She underwent carpal tunnel release surgery on her left hand on October 12, 2024, but reports no relief from symptoms post-surgery. She continues to engage in physical therapy to strengthen her muscles.  She is actively participating in physical therapy and reports improvement in her leg swelling, which she attributes to the therapy. She is able to drive and perform household activities such as doing laundry, although she uses a cane for stability and a walker at night for safety.  Had stress  test done and CT found B pleural effusions-no symptoms-here for f/u    Health Maintenance Due  Topic Date Due   Mammogram  01/08/2025    Past Medical History:  Diagnosis Date   Allergy    Medications and pollen   Anemia    Anxiety    Always   C. difficile colitis    Cancer (HCC)    Endometrial cancer   Cataract    Had surgery 2021   Chronic kidney disease 2018   2 siblings had kidney failure, sister had transplant   Clotting disorder    Essential and other specified forms of tremor    Hyperlipidemia    Hypertension    Osteoporosis 11/2019   T score -2.8 stable from prior DEXA   Personal history of venous thrombosis and embolism 1997   Pulmonary embolism (HCC) 06/14/2021   Pulmonary embolus (HCC)    Shingles    Stroke (HCC) 2018   TIA   Thrush    TIA (transient ischemic attack)    carotid US  07/2022: no ICA stenosis bilaterally    Past Surgical History:  Procedure Laterality Date   ABDOMINAL HYSTERECTOMY  1997   APPENDECTOMY  1961   CARPAL TUNNEL RELEASE Left 10/12/2024   cataract surg Bilateral 2021   Dr. Roz, R eye 8/18, L 8/25   CESAREAN SECTION  1961   EYE SURGERY  2021   Cataract both eyes   FRACTURE SURGERY  2025   Broken hip   INTRAMEDULLARY (IM) NAIL INTERTROCHANTERIC Left 04/28/2024   Procedure: FIXATION, FRACTURE, INTERTROCHANTERIC, WITH INTRAMEDULLARY ROD;  Surgeon: Kendal Franky SQUIBB,  MD;  Location: [facility] OR;  Service: Orthopedics;  Laterality: Left;   TONSILLECTOMY AND ADENOIDECTOMY  1944   TOTAL ABDOMINAL HYSTERECTOMY W/ BILATERAL SALPINGOOPHORECTOMY  1997    Current Medications[1]  Allergies[2] ROS neg/noncontributory except as noted HPI/below      Objective:     BP (!) 160/80 (Cuff Size: Normal)   Pulse 72   Temp (!) 97.2 F (36.2 C) (Temporal)   Resp (!) 98   Ht 5' 3.5 (1.613 m)   Wt 135 lb 4 oz (61.3 kg)   BMI 23.58 kg/m  Wt Readings from Last 3 Encounters:  12/13/24 135 lb 4 oz (61.3 kg)  10/19/24 133 lb 3.2 oz (60.4 kg)   10/04/24 140 lb (63.5 kg)    Physical Exam VITALS: BP- 171/94 GENERAL: Well developed, well nourished, no acute distress. HEAD EYES EARS NOSE THROAT: Normocephalic, atraumatic, conjunctiva not injected, sclera nonicteric. CARDIAC: Regular rate and rhythm, S1 S2 present, no murmur,  NECK: Supple, no thyromegaly, no nodes, no carotid bruits. LUNGS: Clear to auscultation bilaterally, no wheezes.SABRA EXTREMITIES: Mild swelling in left leg, not worse. MUSCULOSKELETAL: No gross abnormalities. Using cane NEUROLOGICAL: Alert and oriented x3, cranial nerves II through XII intact. PSYCHIATRIC: Normal mood, good eye contact.   Reviewed CT's, etx.     Assessment & Plan:  Essential hypertension -     Olmesartan  Medoxomil; Take 1 tablet (20 mg total) by mouth daily.  Dispense: 90 tablet; Refill: 1 -     Basic metabolic panel with GFR  Pleural effusion  Age-related osteoporosis with current pathological fracture with routine healing, subsequent encounter  Other orders -     Propranolol  HCl ER; Take 1 capsule (160 mg total) by mouth daily.  Dispense: 90 capsule; Refill: 1    Assessment and Plan Assessment & Plan Essential hypertension   Blood pressure readings are inconsistent, with higher readings at home than in-office. She is currently on propranolol  and olmesartan . There are concerns about the accuracy of her home blood pressure cuff. To improve blood pressure control, olmesartan  is increased to a full tablet daily, and propranolol  is changed to a long-acting formulation once daily. Blood pressure will be monitored closely after these changes. Blood work will be rechecked to assess kidney function. She should bring her blood pressure cuff to the next appointment for evaluation.  History of pulmonary embolism with chronic pleural effusions   Chronic pleural effusions are noted on imaging, consistent with previous findings, and there are no acute respiratory symptoms. Previous pulmonary  embolisms are noted, with no current evidence of acute embolism. Radiology is consulted to compare past and current imaging for changes in pleural effusions. A referral to pulmonology will be considered if radiology findings indicate significant changes.will check w/rad  Osteoporosis   She is due for a Prolia  injection in January or February, with the previous injection in August obtained from a specialty pharmacy at a reduced cost. The Prolia  injection is ordered for January or February, and coordination with the specialty pharmacy for delivery is complete.  Carpal tunnel syndrome, left hand, post-surgical   Following carpal tunnel release on November 4th, there is no relief of symptoms yet, likely due to age-related factors and severity of initial compression. Physical therapy for the left hand will continue.  General Health Maintenance   General health maintenance was discussed, including the use of a cane for safety and mobility. Continued use of the cane is encouraged for safety and mobility.   Call radiol ,  prolia  to specialty  pharm  Return in about 2 weeks (around 12/27/2024) for HTN.  Jenkins CHRISTELLA Carrel, MD     [1]  Current Outpatient Medications:    acetaminophen  (TYLENOL ) 500 MG tablet, Take 500 mg by mouth every 6 (six) hours as needed for moderate pain., Disp: , Rfl:    apixaban  (ELIQUIS ) 5 MG TABS tablet, Take 1 tablet (5 mg total) by mouth 2 (two) times daily., Disp: 180 tablet, Rfl: 3   atorvastatin  (LIPITOR) 10 MG tablet, TAKE 1 TABLET BY MOUTH DAILY FOR CHOLESTEROL, Disp: 90 tablet, Rfl: 3   Calcium  Carb-Cholecalciferol  (CALCIUM  PLUS VITAMIN D  PO), Take 1 tablet by mouth daily. 1 chewable daily, Disp: , Rfl:    Cyanocobalamin  500 MCG CHEW, Chew 1 tablet by mouth daily., Disp: , Rfl:    escitalopram  (LEXAPRO ) 5 MG tablet, Take 1 tablet (5 mg total) by mouth daily., Disp: 90 tablet, Rfl: 1   hydrOXYzine  (ATARAX ) 25 MG tablet, Take 1 tablet (25 mg total) by mouth daily., Disp: 90  tablet, Rfl: 1   pramipexole  (MIRAPEX ) 0.125 MG tablet, Take 1 tablet (0.125 mg total) by mouth at bedtime., Disp: 90 tablet, Rfl: 1   propranolol  ER (INDERAL  LA) 160 MG SR capsule, Take 1 capsule (160 mg total) by mouth daily., Disp: 90 capsule, Rfl: 1   olmesartan  (BENICAR ) 20 MG tablet, Take 1 tablet (20 mg total) by mouth daily., Disp: 90 tablet, Rfl: 1  Current Facility-Administered Medications:    denosumab  (PROLIA ) injection 60 mg, 60 mg, Subcutaneous, Q6 months, Carrel Jenkins Jansky, MD, 60 mg at 07/15/24 1524   denosumab  (PROLIA ) injection 60 mg, 60 mg, Subcutaneous, Once, Carrel Jenkins Jansky, MD   denosumab  (PROLIA ) injection 60 mg, 60 mg, Subcutaneous, Q6 months, Carrel Jenkins Jansky, MD [2]  Allergies Allergen Reactions   Codeine Hives   Other Other (See Comments)    Reaction: Fever (intolerance)   Cortisone     Causes jitteriness.   Dexamethasone      anxiety   Hctz [Hydrochlorothiazide ] Other (See Comments)    Low sodium   Meloxicam  Nausea Only    Gastritis and nausea   Mysoline [Primidone]    Pneumovax 23 [Pneumococcal Vac Polyvalent]     Red, raised area.  Knot at site of injection   Prednisone      Anxiety   Topiramate      Sleepiness, numbness feet tingling in arm.   Toradol  [Ketorolac  Tromethamine ] Swelling    Localized redness at injection site.     Ace Inhibitors Cough   "

## 2024-12-13 NOTE — Progress Notes (Signed)
Lab ok

## 2024-12-14 ENCOUNTER — Other Ambulatory Visit (HOSPITAL_COMMUNITY): Payer: Self-pay

## 2024-12-14 ENCOUNTER — Telehealth: Payer: Self-pay

## 2024-12-14 MED ORDER — DENOSUMAB 60 MG/ML ~~LOC~~ SOSY: 60.0000 mg | PREFILLED_SYRINGE | SUBCUTANEOUS | Status: AC

## 2024-12-14 NOTE — Telephone Encounter (Signed)
-----   Message from Jenkins Carrel, MD sent at 12/14/2024  8:13 AM EST ----- Needs prolia  sent to specialty pharmacy please

## 2024-12-14 NOTE — Telephone Encounter (Signed)
 Prolia  VOB initiated via [email or web address]  Next Prolia  inj DUE: 01/15/25

## 2024-12-14 NOTE — Therapy (Signed)
 " OUTPATIENT PHYSICAL THERAPY NEURO TREATMENT NOTE  Patient Name: Monica Curry MRN: [id number] DOB:July 19, 1931, 89 y.o., female Today's Date: 12/15/2024   PCP: Wendolyn Jenkins Jansky, MD REFERRING PROVIDER: Danton Lauraine LABOR, PA-C       END OF SESSION:  PT End of Session - 12/15/24 1152     Visit Number 24    Number of Visits 30    Date for Recertification  01/07/25    Authorization Type [employer]-reauth submitted 12/13/2024    Authorization Time Period approved 7 more PT visits from 12/13/24-01/07/25    Authorization - Visit Number 2    Authorization - Number of Visits 7    Progress Note Due on Visit 30    PT Start Time 1103    PT Stop Time 1149    PT Time Calculation (min) 46 min    Equipment Utilized During Treatment Gait belt    Activity Tolerance Patient tolerated treatment well    Behavior During Therapy WFL for tasks assessed/performed                           Past Medical History:  Diagnosis Date   Allergy    Medications and pollen   Anemia    Anxiety    Always   C. difficile colitis    Cancer (HCC)    Endometrial cancer   Cataract    Had surgery 2021   Chronic kidney disease 2018   2 siblings had kidney failure, sister had transplant   Clotting disorder    Essential and other specified forms of tremor    Hyperlipidemia    Hypertension    Osteoporosis 11/2019   T score -2.8 stable from prior DEXA   Personal history of venous thrombosis and embolism 1997   Pulmonary embolism (HCC) 06/14/2021   Pulmonary embolus (HCC)    Shingles    Stroke (HCC) 2018   TIA   Thrush    TIA (transient ischemic attack)    carotid US  07/2022: no ICA stenosis bilaterally   Past Surgical History:  Procedure Laterality Date   ABDOMINAL HYSTERECTOMY  1997   APPENDECTOMY  1961   CARPAL TUNNEL RELEASE Left 10/12/2024   cataract surg Bilateral 2021   Dr. Roz, R eye 8/18, L 8/25   CESAREAN SECTION  1961   EYE SURGERY  2021   Cataract both eyes    FRACTURE SURGERY  2025   Broken hip   INTRAMEDULLARY (IM) NAIL INTERTROCHANTERIC Left 04/28/2024   Procedure: FIXATION, FRACTURE, INTERTROCHANTERIC, WITH INTRAMEDULLARY ROD;  Surgeon: Kendal Franky SQUIBB, MD;  Location: [facility] OR;  Service: Orthopedics;  Laterality: Left;   TONSILLECTOMY AND ADENOIDECTOMY  1944   TOTAL ABDOMINAL HYSTERECTOMY W/ BILATERAL SALPINGOOPHORECTOMY  1997   Patient Active Problem List   Diagnosis Date Noted   RLS (restless legs syndrome) 06/15/2024   Fracture, Colles, left, closed 04/28/2024   Hip fracture (HCC) 04/27/2024   GAD (generalized anxiety disorder) 04/01/2024   Thyroid  nodule 01/10/2022   Anxiety 08/10/2021   Aortic atherosclerosis (HCC) - CT 06/2021 06/15/2021   Bilateral renal cysts 07/06/2020   Paroxysmal supraventricular tachycardia 10/20/2019   History of skin cancer (basal cell) 06/16/2019   Complete right bundle branch block 06/16/2019   B12 deficiency 06/15/2019   History of TIA (transient ischemic attack) 10/23/2017   Chronic kidney disease, stage 3b (HCC) 07/21/2015   Medication management 07/04/2014   Essential hypertension 11/25/2013   Hyperlipidemia, mixed 11/25/2013   Abnormal  glucose 11/25/2013   Vitamin D  deficiency 11/25/2013   Essential tremor 11/25/2013   History of endometrial cancer    Hx/o DVT/PE    Urethral prolapse    Osteoporosis 10/10/2011    ONSET DATE: 04/28/2024  REFERRING DIAG: S/P L intertrachanteric femur fracture, Left distal radius fracture 04/28/2024   THERAPY DIAG:  Other abnormalities of gait and mobility  Unsteadiness on feet  Muscle weakness (generalized)  Rationale for Evaluation and Treatment: Rehabilitation  SUBJECTIVE:                                                                                                                                                                                             SUBJECTIVE STATEMENT: Doing well.    Pt accompanied by: self  PERTINENT HISTORY:  osteoporosis, hx of DVT; see additional PMH above  PAIN:  Are you having pain? No   PRECAUTIONS: Fall, wears L compression stocking due to L LE edema (was negative for DVT)  **CTR surgery LUE 10/12/24:  wearing bandage for 5 days, no lifting, to return to MD 11/18 for removal of stitches, follow up  RED FLAGS: None   WEIGHT BEARING RESTRICTIONS: No  FALLS: Has patient fallen in last 6 months? Yes. Number of falls 1  LIVING ENVIRONMENT: Lives with: lives alone Lives in: House/apartment Stairs: 3 steps to enter home;  Has following equipment at home: Single point cane and Walker - 2 wheeled *cane has tripod base and has gotten quad tip (per 10/28 visit) PLOF: Independent  PATIENT GOALS: To be able to transition to the cane, so I can get in and out of home better. 12/13/24:  To be able to maneuver better while carrying things.    OBJECTIVE:     TODAY'S TREATMENT: 12/15/24 Activity Comments  Nustep L5 x 6 min UEs/LEs Dynamic warm up   Reviewed and performed HEP to assess form and benefit:   Marching in place, side hip kicks, back hip kicks:  Do 3 sets of 10 with 2# weight  Exercises - Backward Walking with Counter Support  - 1 x daily - 5 x weekly - 1 sets - 5 reps - Alternating Step Taps with Counter Support  - 1 x daily - 5 x weekly - 3 sets - 10 reps - Side Stepping with Counter Support  - 1 x daily - 5 x weekly - 1 sets - 5 reps - Corner Balance Feet Together With Eyes Closed  - 1 x daily - 7 x weekly - 3 reps - 30 sec hold - Corner Balance Feet Together: Eyes Open With Head Turns  - 1 x daily -  7 x weekly - 3 reps - 30 sec hold - Corner Balance Feet Together: Eyes Closed With Head Turns  - 1 x daily - 7 x weekly - 3 reps - 30 sec hold - Semi-Tandem Corner Balance With Eyes Open  - 1 x daily - 7 x weekly - 3 reps - 15-30 sec hold - Supine Bridge  - 1 x daily - 3-4 x weekly - 3 sets - 10 reps - Hooklying Isometric Clamshell  - 1 x daily - 3-4 x weekly - 3 sets - 10 reps - Sit  to Stand with Resistance Around Legs  - 1 x daily - 4 x weekly - 2 sets - 10 reps - Seated Hamstring Stretch  - 1 x daily - 7 x weekly - 1 sets - 3 reps - 30 sec hold - Sidelying Hip Abduction  - 1 x daily - 2-3 x weekly - 3 sets - 10 reps Provided SLS lift offs instead of toe taps. Required heavy cueing and positioning adjustments for HS stretch. Adjusted STS with red TB and L foot back in staggered stance. Tried bent knee fallouts with red and green band but pt reports still little challenge. Increased challenge with bridges with green TB                    HOME EXERCISE PROGRAM 12/15/24 Access Code: [email or web address] URL: [email or web address] Date: 12/15/2024 Prepared by: [facility]  Exercises - Corner Balance Feet Together: Eyes Closed With Head Turns  - 1 x daily - 5 x weekly - 3 reps - 30 sec hold - Seated Hamstring Stretch  - 1 x daily - 5 x weekly - 1 sets - 3 reps - 30 sec hold - Standing Foot Lift on Box (BKA)  - 1 x daily - 5 x weekly - 2 sets - 10 reps - Standing Hip Abduction with Ankle Weight  - 1 x daily - 2-3 x weekly - 2 sets - 10 reps - Standing Hip Extension with Ankle Weight  - 1 x daily - 2-3 x weekly - 2 sets - 10 reps - Sit to Stand with Resistance Around Legs  - 1 x daily - 2-3 x weekly - 2 sets - 10 reps - Supine Bridge with Resistance Band  - 1 x daily - 2-3 x weekly - 2 sets - 10 reps - Sidelying Hip Abduction  - 1 x daily - 2-3 x weekly - 3 sets - 10 reps  PATIENT EDUCATION: Education details: HEP update  Person educated: Patient Education method: Explanation, Demonstration, Tactile cues, Verbal cues, and Handouts Education comprehension: verbalized understanding and returned demonstration    -------------------------------------------- Note: Objective measures were completed at Evaluation unless otherwise noted.  DIAGNOSTIC FINDINGS: Interval proximal left femoral cephalomedullary nail fixation of  the previously seen intertrochanteric fracture. Improved alignment.  COGNITION: Overall cognitive status: Within functional limits for tasks assessed   SENSATION: Reports decreased sensation in L fingers (to see hand surgeon)  EDEMA:  Noted LLE; wearing compression stocking  POSTURE: rounded shoulders and increased thoracic kyphosis  LOWER EXTREMITY ROM:     Active  Right Eval Left Eval  Hip flexion    Hip extension    Hip abduction    Hip adduction    Hip internal rotation    Hip external rotation    Knee flexion    Knee extension    Ankle dorsiflexion  10  Ankle plantarflexion  40  Ankle inversion    Ankle eversion     (Blank rows = not tested)  LOWER EXTREMITY MMT:    MMT Right Eval Left Eval  Hip flexion 4 3+  Hip extension    Hip abduction 5 4  Hip adduction 5 4  Hip internal rotation    Hip external rotation    Knee flexion 4+ 4+  Knee extension 4+ 4  Ankle dorsiflexion 4+ 4  Ankle plantarflexion    Ankle inversion    Ankle eversion    (Blank rows = not tested)  TRANSFERS: Sit to stand: Modified independence  Assistive device utilized: None     Stand to sit: Modified independence  Assistive device utilized: hands at chair      STAIRS: Findings: Level of Assistance: Modified independence, Stair Negotiation Technique: Step to Pattern with Single Rail on Right, Number of Stairs: 2-3, Height of Stairs: 4-6   , and Comments: prior to fracture, pt negotiated steps alternating pattern GAIT: Findings: Gait Characteristics: step through pattern, decreased step length- Right, and decreased stance time- Left, Distance walked: 50 ft x 2, Assistive device utilized:Walker - 2 wheeled and SPC with small quad tip base, Level of assistance: Modified independence, CGA, and Min A, and Comments: Mod I with RW, CGA/min assist with cane  FUNCTIONAL TESTS:  10 meter walk test: 16.78 sec RW (1.95 ft/sec); 33.62 sec with cane (0.98 ft/sec) Berg:  34/56 (Scores <45/56  indicate increased fall risk)                                                                                                                                  TREATMENT DATE: 08/30/2024    PATIENT EDUCATION: Education details: Eval results, POC, continue HEP from HHPT; verbally added lateral weightshifting to LLE, with attention to light UE support Person educated: Patient Education method: Explanation, Demonstration, and Verbal cues Education comprehension: verbalized understanding, returned demonstration, and needs further education  HOME EXERCISE PROGRAM: Verbally initiated lateral weightshifting; pt doing standing hip kicks, heel/toe raises, marching and sit to stand at home  GOALS: Goals reviewed with patient? Yes  SHORT TERM GOALS: Target date: 10/01/2024  Pt will be independent with HEP for improved gait, stair negotiation, balance. Baseline: no concerns on HEP  09/29/24 Goal status: IN PROGRESS 09/29/24  2.  Pt will improve Berg score to at least 45/56 to decrease fall risk. Baseline: 34/56>44/56 09/27/2024 Goal status: PARTIALLY MET 09/27/2024, MET 10/27/2024  3.  Pt will negotiate curb and 3 steps with cane, mod I, for improved household and community gait. Baseline: stairs with cane with CGA  09/29/24 Goal status: MET, 10/27/2024   LONG TERM GOALS: Target date: 10/29/2024>12/10/2024>UPDATED TARGET 01/07/2025  Pt will be independent with HEP for improved balance, strength, gait, stairs. Baseline: continuing to update Goal status: IN PROGRESS, 10/27/2024  2.  Pt will improve DGI score to at least 20/24 to decrease fall risk. Baseline: 11/24>16/24>18/24  12/13/24 Goal status:IN PROGRESS, 12/13/24  3.  Pt will improve gait velocity to at least 2.6 ft/sec with cane for improved gait efficiency and safety. Baseline: 0.98 ft/sec>2.1 ft/sec with cane 10/27/2024 (2.5 ft/sec no cane 12/13/2024 Goal status: IN PROGRESS, 12/13/2024  4.  Pt will ambulate at least 500 ft, indoor  and outdoor gait, mod I with cane for improved community mobility. Baseline: community distances with daughter, per report Goal status: MET, 10/27/2024  5.  Pt will improve TUG score to less than or equal to 13.5 sec without device, for decreased fall risk.  Baseline:  TUG no cane 17.13 sec 10/27/24>15.5 sec 12/13/2024  Goal status:  IN PROGRESS 12/13/24  6.  Pt will improve TUG manual score to less than or equal to 15 sec for decreased fall risk.  Baseline:  19.87 sec 10/27/24; 17 sec 12/13/24  Goal status:  IN PROGRESS  7.  Pt will improve gait velocity *with no cane* to at least 2.3 ft/sec for improved gait efficiency and safety.  Baseline:  1.82 ft/sec 10/27/24>2.51 ft/sec 12/13/2024  Goal status:  MET, 12/13/2024  8.  Pt will improve 5x sit<>stand to less than or equal to 14.5 sec to demonstrate improved functional strength and transfer efficiency.  Baseline:  16.16 sec 10/27/24, 18.06 sec 12/13/24  Goal status:  IN PROGRESS    ASSESSMENT:  CLINICAL IMPRESSION: Patient arrived to session without complaints. Session focused on HEP review and consolidation for max benefit and challenge. Patient reported limited challenge with several activities, thus these exercises were able to be discharged. Provided additional modifications for increased challenge when appropriate. Patient tolerated session well and without complaints at end of appointment. OBJECTIVE IMPAIRMENTS: Abnormal gait, decreased balance, decreased mobility, difficulty walking, and decreased strength.   ACTIVITY LIMITATIONS: standing, stairs, transfers, and locomotion level  PARTICIPATION LIMITATIONS: meal prep, cleaning, driving, shopping, community activity, yard work, and church  PERSONAL FACTORS: 3+ comorbidities: see PMH above are also affecting patient's functional outcome.   REHAB POTENTIAL: Good  CLINICAL DECISION MAKING: Evolving/moderate complexity  EVALUATION COMPLEXITY: Moderate  PLAN:  PT FREQUENCY:  2x/week  PT DURATION: 6 weeks per recert 10/27/2024  PLANNED INTERVENTIONS: 97750- Physical Performance Testing, 97110-Therapeutic exercises, 97530- Therapeutic activity, 97112- Neuromuscular re-education, 97535- Self Care, 02859- Manual therapy, [id number]- Gait training, Patient/Family education, Balance training, Stair training, and DME instructions  PLAN FOR NEXT SESSION:  SLS, glut activation in standing.  Continue weight progression/resistance with standing exercises at counter; progression of HEP(updated 12/15/24).  Gait training and balance training in clinic no device as appropriate.     Louana Terrilyn Christians, PT, DPT 12/15/2024 11:55 AM  [facility] at [facility] [address] [place], [address] [place], [place] [address] Phone # [phone] Fax # [phone]  "

## 2024-12-14 NOTE — Progress Notes (Signed)
Pt has read results.

## 2024-12-15 ENCOUNTER — Ambulatory Visit: Admitting: Physical Therapy

## 2024-12-15 ENCOUNTER — Encounter: Payer: Self-pay | Admitting: Physical Therapy

## 2024-12-15 DIAGNOSIS — M25632 Stiffness of left wrist, not elsewhere classified: Secondary | ICD-10-CM | POA: Diagnosis not present

## 2024-12-15 DIAGNOSIS — R2689 Other abnormalities of gait and mobility: Secondary | ICD-10-CM

## 2024-12-15 DIAGNOSIS — R2681 Unsteadiness on feet: Secondary | ICD-10-CM

## 2024-12-15 DIAGNOSIS — M6281 Muscle weakness (generalized): Secondary | ICD-10-CM

## 2024-12-15 NOTE — Patient Instructions (Signed)
 Monica Curry

## 2024-12-17 ENCOUNTER — Ambulatory Visit: Admitting: Occupational Therapy

## 2024-12-17 DIAGNOSIS — M6281 Muscle weakness (generalized): Secondary | ICD-10-CM

## 2024-12-17 DIAGNOSIS — M25632 Stiffness of left wrist, not elsewhere classified: Secondary | ICD-10-CM

## 2024-12-17 DIAGNOSIS — R278 Other lack of coordination: Secondary | ICD-10-CM

## 2024-12-17 DIAGNOSIS — R208 Other disturbances of skin sensation: Secondary | ICD-10-CM

## 2024-12-17 NOTE — Therapy (Signed)
 " OUTPATIENT OCCUPATIONAL THERAPY ORTHO Treatment Note  Patient Name: Monica Curry MRN: [id number] DOB:[birth year], 89 y.o., female Today's Date: 12/17/2024  PCP: Monica Jenkins Jansky, MD  REFERRING PROVIDER: Arlinda Buster, MD  END OF SESSION:  OT End of Session - 12/17/24 0938     Visit Number 11    Number of Visits 21    Date for Recertification  01/21/25    Authorization Type [employer]    Authorization Time Period Auth#: [id number], approved 10 more OT visits from 12/17/24-03/17/25    Authorization - Visit Number 2    Authorization - Number of Visits 10    OT Start Time 0934    OT Stop Time 1014    OT Time Calculation (min) 40 min    Activity Tolerance Patient tolerated treatment well    Behavior During Therapy [facility] for tasks assessed/performed                   Past Medical History:  Diagnosis Date   Allergy    Medications and pollen   Anemia    Anxiety    Always   C. difficile colitis    Cancer (HCC)    Endometrial cancer   Cataract    Had surgery 2021   Chronic kidney disease 2018   2 siblings had kidney failure, sister had transplant   Clotting disorder    Essential and other specified forms of tremor    Hyperlipidemia    Hypertension    Osteoporosis 11/2019   T score -2.8 stable from prior DEXA   Personal history of venous thrombosis and embolism 1997   Pulmonary embolism (HCC) 06/14/2021   Pulmonary embolus (HCC)    Shingles    Stroke (HCC) 2018   TIA   Thrush    TIA (transient ischemic attack)    carotid US  07/2022: no ICA stenosis bilaterally   Past Surgical History:  Procedure Laterality Date   ABDOMINAL HYSTERECTOMY  1997   APPENDECTOMY  1961   CARPAL TUNNEL RELEASE Left 10/12/2024   cataract surg Bilateral 2021   Dr. Roz, R eye 8/18, L 8/25   CESAREAN SECTION  1961   EYE SURGERY  2021   Cataract both eyes   FRACTURE SURGERY  2025   Broken hip   INTRAMEDULLARY (IM) NAIL INTERTROCHANTERIC Left 04/28/2024   Procedure:  FIXATION, FRACTURE, INTERTROCHANTERIC, WITH INTRAMEDULLARY ROD;  Surgeon: Monica Franky SQUIBB, MD;  Location: [facility] OR;  Service: Orthopedics;  Laterality: Left;   TONSILLECTOMY AND ADENOIDECTOMY  1944   TOTAL ABDOMINAL HYSTERECTOMY W/ BILATERAL SALPINGOOPHORECTOMY  1997   Patient Active Problem List   Diagnosis Date Noted   RLS (restless legs syndrome) 06/15/2024   Fracture, Col[place], left, closed 04/28/2024   Hip fracture (HCC) 04/27/2024   GAD (generalized anxiety disorder) 04/01/2024   Thyroid  nodule 01/10/2022   Anxiety 08/10/2021   Aortic atherosclerosis (HCC) - CT 06/2021 06/15/2021   Bilateral renal cysts 07/06/2020   Paroxysmal supraventricular tachycardia 10/20/2019   History of skin cancer (basal cell) 06/16/2019   Complete right bundle branch block 06/16/2019   B12 deficiency 06/15/2019   History of TIA (transient ischemic attack) 10/23/2017   Chronic kidney disease, stage 3b (HCC) 07/21/2015   Medication management 07/04/2014   Essential hypertension 11/25/2013   Hyperlipidemia, mixed 11/25/2013   Abnormal glucose 11/25/2013   Vitamin D  deficiency 11/25/2013   Essential tremor 11/25/2013   History of endometrial cancer    Hx/o DVT/PE    Urethral prolapse  Osteoporosis 10/10/2011    ONSET DATE: referral date 10/07/24 (injury 04/27/24 and [facility] sx 10/12/24)  REFERRING DIAG: G56.02 (ICD-10-CM) - Carpal tunnel syndrome, left upper limb  THERAPY DIAG:  Stiffness of left wrist, not elsewhere classified  Other lack of coordination  Other disturbances of skin sensation  Muscle weakness (generalized)  Rationale for Evaluation and Treatment: Rehabilitation  SUBJECTIVE:   SUBJECTIVE STATEMENT: Pt reports no changes in the hand/wrist.  Pt does report that her BP had been running higher and her PCP changed her medication to a long acting that she started yesterday.  Pt accompanied by: self  PERTINENT HISTORY:   Per MD note 10/26/24: 89 y.o. female who presents today for  follow up 2 weeks status post left wrist open carpal tunnel release.  Doing well overall, pain controlled, does have persistent numbness and tingling.  Left hand: - Well-healing palmar incision, sutures removed, skin edges well-approximated without erythema or drainage - Composite fist limited secondary to stiffness, improved passively - Sensation remains diminished to light touch in the median nerve distribution - 4/5 APB mild thenar atrophy, thumb opposition to ring finger PIP  PRECAUTIONS: Fall  WEIGHT BEARING RESTRICTIONS: No  PAIN:  Are you having pain? Yes: NPRS scale: minimal Pain location: palm of hand Pain description: twinge Aggravating factors: touching hand around incision Relieving factors: being still  FALLS: Has patient fallen in last 6 months? Yes. Number of falls 1 - fall in May that resulted in hip and wrist fractures.  LIVING ENVIRONMENT: Lives with: lives alone Lives in: House/apartment Stairs: 3 steps to enter, flight of steps to the basement but she does not go down them Has following equipment at home: [name] - 2 wheeled, single point cane, shower chair, and Grab bars  PLOF: Independent with basic ADLs and Requires assistive device for independence; step-daughter is currently doing laundry at her home as pt's washer/dryer are in the basement  PATIENT GOALS: to be able to use Left hand again   NEXT MD VISIT: 11/23/24  OBJECTIVE:  Note: Objective measures were completed at Evaluation unless otherwise noted.  HAND DOMINANCE: Right  ADLs: WFL; difficulty with donning compression sock on L leg but is able to don  FUNCTIONAL OUTCOME MEASURES: Quick Dash: 50%   12/07/24   UPPER EXTREMITY ROM:     Active ROM Right eval Left eval Left 11/22/24 Left 12/07/24  Shoulder flexion      Shoulder abduction      Shoulder adduction      Shoulder extension      Shoulder internal rotation      Shoulder external rotation      Elbow flexion      Elbow  extension      Wrist flexion 75 34 30 38  Wrist extension 60 17 40 36  Wrist ulnar deviation 30 18 22 24   Wrist radial deviation 12 10 8 12   Wrist pronation  WFL    Wrist supination  80%  100%  (Blank rows = not tested)  Loose grasp on L, however all fingers are able to touch palm of hand lightly in open fist, decreased full fist, hook fist, and table top  HAND FUNCTION: Not assessed due to stitches just removed yesterday.  On eval prior to surgery 08/30/24: Right: 40 lbs; Left: 5 lbs  12/07/24: Grip strength:  COORDINATION: 9 Hole Peg test: Right: 30.13 sec; Left: 61.75 sec; Mild B tremors  11/22/24: left: 39.56 sec  12/07/24 Left: 37.21 sec; min-moderate tremors this session  SENSATION: Numbness and tingling in L hand and digits along median nerve distribution  EDEMA: NA  COGNITION: Overall cognitive status: Within functional limits for tasks assessed   OBSERVATIONS: Pt L wrist at ulnar head and dorsal wrist enlarged compared to R wrist and hand due to bone deformity at dorsal wrist and ulnar styloid s/p setting of fracture back in May 2025. Pt demonstrating ~50% movement in all directions when compared to R, but verbalizing good understanding of exercises.    TREATMENT DATE:  12/17/24 Heat: applied moist heat to L wrist prior to engaging in wrist ROM and strengthening.  Pt with skin intact pre and post heat.   AAROM: engaged L wrist flexion/extension with OT providing overpressure to facilitate increased stretch and ROM. Wrist ROM: flexion/extension with weighted ball with cues to sustain hold 2-3 seconds at end range.  OT providing demonstration and min cues for technique. Utilized flex bar to attempt ulnar/radial deviation.  OT providing demonstration and cues, pt demonstrating min difficulty with technique.  OT educating on norms and comparing previous measurements to RUE ROM.  Engaged in circumduction with 2# dumbbell in clockwise and counter-clockwise directions followed  by flexion/extension with 2# dumbbell.   12/10/24 Heat applied as preparatory activity with skin in tact pre and post treatment prior to ROM and strengthening. Education for joint protection during moist heat. OT performing soft tissue massage/scar massage briefly for L wrist with no c/o pain. Guided through tendon glides for L hand, OT demonstrating and assisting with form and technique.  AAROM for L wrist for ulnar/radial deviation, wrist flex/ext, circumduction before upgrading to mild resistance strengthening with 1# and upgraded to 2# handweight for the above movements with towel under forearm for support in table top, mild overpressure as able to tolerate.  Grip strengthening: light blue resistance gripper for composite grip for 1x10 with 3 second hold. Educated on sensitization techniques and brain/body connection, pt not really interested in textures or sensitization techniques this date.   12/08/24 Heat: applied moist heat to L wrist in preparation for ROM exercises.  OT reiterating education on checking skin prior to and post heat for skin integrity.  Completed QuickDASH while heat on L wrist. Re-assessed goals, see above objective and below goals section for further details.  ROM: engaged in AAROM wrist flexion with overpressure with arm positioned on edge of table top to allow gravity to aid in ROM and stretch.  Reiterated use of 1# weight to aid in ROM as well as strengthening of grip. Grip strength: engaged in full grasp on stress ball with hold for 5 seconds with each repetition.  OT encouraged pt to attempt increased resistance theraputty as pt reporting having 2 strengths at home and recommended that she bring it to next session. Hand Gripper: with LUE on level 5# with green spring. Pt picked up 1 inch blocks with gripper with 2 drops and min-mod difficulty.  Pt demonstrating difficulty with task largely due to tremors impacting sustained grasp with movement as well as decreased grip  strength even on lowest setting.      PATIENT EDUCATION: Education details: heat, massage, ROM Person educated: Patient Education method: Explanation, Demonstration, Verbal cues, and Handouts Education comprehension: verbalized understanding and needs further education  HOME EXERCISE PROGRAM: Carpal tunnel release HEP (see pt instructions)  Access Code: [email or web address] URL: [email or web address] Date: 11/10/2024 Prepared by: [facility] - Outpatient  Rehab - [facility]  Exercises - Putty Squeezes  - 2 x daily - 15 reps - 3-Point Pinch  with Putty  - 2 x daily - 15 reps - Finger Lumbricals with Putty  - 2 x daily - 15 reps - Seated Wrist Extension Stretch  - 2 x daily - 5-10 reps - Seated Wrist Flexion Stretch  - 2 x daily - 5-10 reps  GOALS: Goals reviewed with patient? Yes   SHORT TERM GOALS: Target date: 12/31/24  Pt will be independent with HEP for ROM and strengthening. Baseline: new to OPOT Goal status: in progress   2.  Pt will verbalize understanding of task modifications and/or potential A/E needs to increase ease, safety, and independence w/ ADLs. Baseline: difficulty with donning compression socks d/t decreased grip and mobility of L wrist/hand Goal status: in progress  3.  Pt will verbalize understanding of re-sensitization strategies as well as compensatory strategies due to impaired sensation.  Baseline: impaired sensation in hand at median nerve distribution  Goal status: in progress    LONG TERM GOALS: Target date: 01/21/25  1.  Pt will improve grip strength in left hand to be within at least 20 pounds of her right hand for functional use at home and in IADLs.  Baseline: L: 5# and R: 40# 12/07/24: L: 7.5# Goal status: in progress   2.  Pt will demonstrate improved L wrist flexion/extension to 75% of R wrist flexion/extension for improved functional use.  Baseline: R:75/60 and L: 34/17 12/07/24: L: 38/36 Goal status: in progress   3.  Pt  will demonstrate improvements in pain and functional use of LUE as evidenced by decreased score on QuickDASH from 50% impairement to < 30 % impairment.  Baseline: 50% 12/07/24: 40.9% Goal status: in progress   ASSESSMENT:  CLINICAL IMPRESSION: Patient is a 89 y.o. female who was seen today for occupational therapy treatment s/p L carpal tunnel release. Pt continues to report decreased sensation in LUE, reporting increased engagement in touching various textures for sensory reintegration.  Pt reports attempts to utilizing LUE despite decreased sensation in routine tasks.  Pt demonstrating nearly symmetrical radial deviation, however still having difficulty with flexion and extension. Pt will continue to benefit from skilled OT services to address ROM, sensation, strengthening, and improved functional use of LUE.  PERFORMANCE DEFICITS: in functional skills including ADLs, IADLs, coordination, sensation, edema, ROM, strength, flexibility, Fine motor control, Gross motor control, body mechanics, endurance, decreased knowledge of precautions, and UE functional use and psychosocial skills including routines and behaviors.      PLAN:  OT FREQUENCY: 1-2x/week  OT DURATION: 6 weeks  PLANNED INTERVENTIONS: 97168 OT Re-evaluation, 97535 self care/ADL training, 02889 therapeutic exercise, 97530 therapeutic activity, 97112 neuromuscular re-education, 97140 manual therapy, 97035 ultrasound, 97018 paraffin, 02239 Orthotic Initial, 97763 Orthotic/Prosthetic subsequent, passive range of motion, compression bandaging, coping strategies training, patient/family education, and DME and/or AE instructions   RECOMMENDED OTHER SERVICES: NA  CONSULTED AND AGREED WITH PLAN OF CARE: Patient  PLAN FOR NEXT SESSION: Continue use of modalities for ROM and pain relief, after 4-6 weeks s/p sx can start light strengthening, progress STGs/LTGs     Bethzaida Boord, OTR/L 12/17/2024, 9:52 AM  [facility]  at [facility] [address], [address] [place], [place] [address] Phone # [phone] Fax # [phone]  "

## 2024-12-20 ENCOUNTER — Ambulatory Visit: Admitting: Occupational Therapy

## 2024-12-20 ENCOUNTER — Other Ambulatory Visit (HOSPITAL_COMMUNITY): Payer: Self-pay

## 2024-12-20 ENCOUNTER — Ambulatory Visit: Admitting: Physical Therapy

## 2024-12-20 ENCOUNTER — Encounter: Payer: Self-pay | Admitting: Physical Therapy

## 2024-12-20 DIAGNOSIS — R2681 Unsteadiness on feet: Secondary | ICD-10-CM

## 2024-12-20 DIAGNOSIS — M25632 Stiffness of left wrist, not elsewhere classified: Secondary | ICD-10-CM

## 2024-12-20 DIAGNOSIS — R208 Other disturbances of skin sensation: Secondary | ICD-10-CM

## 2024-12-20 DIAGNOSIS — M6281 Muscle weakness (generalized): Secondary | ICD-10-CM

## 2024-12-20 DIAGNOSIS — R278 Other lack of coordination: Secondary | ICD-10-CM

## 2024-12-20 DIAGNOSIS — R2689 Other abnormalities of gait and mobility: Secondary | ICD-10-CM

## 2024-12-20 NOTE — Telephone Encounter (Signed)
" ° °  [employer] cannot give estimate, following Medicare Advantage guideline (20% coinsurance, 20% admin fee)   "

## 2024-12-20 NOTE — Therapy (Signed)
 " OUTPATIENT PHYSICAL THERAPY NEURO TREATMENT NOTE  Patient Name: Monica Curry MRN: [id number] DOB:[birth year], 89 y.o., female Today's Date: 12/20/2024   PCP: Wendolyn Jenkins Jansky, MD REFERRING PROVIDER: Danton Lauraine LABOR, PA-C       END OF SESSION:  PT End of Session - 12/20/24 1019     Visit Number 25    Number of Visits 30    Date for Recertification  01/07/25    Authorization Type [employer]-reauth submitted 12/13/2024    Authorization Time Period approved 7 more PT visits from 12/13/24-01/07/25    Authorization - Visit Number 3    Authorization - Number of Visits 7    Progress Note Due on Visit 30    PT Start Time 1020    PT Stop Time 1100    PT Time Calculation (min) 40 min    Equipment Utilized During Treatment Gait belt    Activity Tolerance Patient tolerated treatment well    Behavior During Therapy WFL for tasks assessed/performed                            Past Medical History:  Diagnosis Date   Allergy    Medications and pollen   Anemia    Anxiety    Always   C. difficile colitis    Cancer (HCC)    Endometrial cancer   Cataract    Had surgery 2021   Chronic kidney disease 2018   2 siblings had kidney failure, sister had transplant   Clotting disorder    Essential and other specified forms of tremor    Hyperlipidemia    Hypertension    Osteoporosis 11/2019   T score -2.8 stable from prior DEXA   Personal history of venous thrombosis and embolism 1997   Pulmonary embolism (HCC) 06/14/2021   Pulmonary embolus (HCC)    Shingles    Stroke (HCC) 2018   TIA   Thrush    TIA (transient ischemic attack)    carotid US  07/2022: no ICA stenosis bilaterally   Past Surgical History:  Procedure Laterality Date   ABDOMINAL HYSTERECTOMY  1997   APPENDECTOMY  1961   CARPAL TUNNEL RELEASE Left 10/12/2024   cataract surg Bilateral 2021   Dr. Roz, R eye 8/18, L 8/25   CESAREAN SECTION  1961   EYE SURGERY  2021   Cataract both eyes    FRACTURE SURGERY  2025   Broken hip   INTRAMEDULLARY (IM) NAIL INTERTROCHANTERIC Left 04/28/2024   Procedure: FIXATION, FRACTURE, INTERTROCHANTERIC, WITH INTRAMEDULLARY ROD;  Surgeon: Kendal Franky SQUIBB, MD;  Location: [facility] OR;  Service: Orthopedics;  Laterality: Left;   TONSILLECTOMY AND ADENOIDECTOMY  1944   TOTAL ABDOMINAL HYSTERECTOMY W/ BILATERAL SALPINGOOPHORECTOMY  1997   Patient Active Problem List   Diagnosis Date Noted   RLS (restless legs syndrome) 06/15/2024   Fracture, [place], left, closed 04/28/2024   Hip fracture (HCC) 04/27/2024   GAD (generalized anxiety disorder) 04/01/2024   Thyroid  nodule 01/10/2022   Anxiety 08/10/2021   Aortic atherosclerosis (HCC) - CT 06/2021 06/15/2021   Bilateral renal cysts 07/06/2020   Paroxysmal supraventricular tachycardia 10/20/2019   History of skin cancer (basal cell) 06/16/2019   Complete right bundle branch block 06/16/2019   B12 deficiency 06/15/2019   History of TIA (transient ischemic attack) 10/23/2017   Chronic kidney disease, stage 3b (HCC) 07/21/2015   Medication management 07/04/2014   Essential hypertension 11/25/2013   Hyperlipidemia, mixed 11/25/2013  Abnormal glucose 11/25/2013   Vitamin D  deficiency 11/25/2013   Essential tremor 11/25/2013   History of endometrial cancer    Hx/o DVT/PE    Urethral prolapse    Osteoporosis 10/10/2011    ONSET DATE: 04/28/2024  REFERRING DIAG: S/P L intertrachanteric femur fracture, Left distal radius fracture 04/28/2024   THERAPY DIAG:  Other abnormalities of gait and mobility  Unsteadiness on feet  Muscle weakness (generalized)  Rationale for Evaluation and Treatment: Rehabilitation  SUBJECTIVE:                                                                                                                                                                                             SUBJECTIVE STATEMENT: Did all the exercises (updated) last night.  Had a good weekend with my  daughter.  Pt accompanied by: self  PERTINENT HISTORY: osteoporosis, hx of DVT; see additional PMH above  PAIN:  Are you having pain? No   PRECAUTIONS: Fall, wears L compression stocking due to L LE edema (was negative for DVT)  **CTR surgery LUE 10/12/24:  wearing bandage for 5 days, no lifting, to return to MD 11/18 for removal of stitches, follow up  RED FLAGS: None   WEIGHT BEARING RESTRICTIONS: No  FALLS: Has patient fallen in last 6 months? Yes. Number of falls 1  LIVING ENVIRONMENT: Lives with: lives alone Lives in: House/apartment Stairs: 3 steps to enter home;  Has following equipment at home: Single point cane and Walker - 2 wheeled *cane has tripod base and has gotten quad tip (per 10/28 visit) PLOF: Independent  PATIENT GOALS: To be able to transition to the cane, so I can get in and out of home better. 12/13/24:  To be able to maneuver better while carrying things.    OBJECTIVE:     TODAY'S TREATMENT: 12/20/2024 Activity Comments  Verbally reviewed most of HEP Return demo seated hamstring stretch and sit to stand stagger stance with red band at knees  Reminder cues  SLS activities:   -step tap/hover to 4 step, 2 x 5 reps each, then standing on [employer], step tap and hover to 6 step, 2 x 5 -Standing on solid ground-multi direction steptaps Light>intermittent UE support  Corner balance on foam:   EO and EC head turns/head nods with feet apart/together Mild/mod sway  Pt asks about floor to stand transfer, which we discussed  Checked with OT about any restrictions/precaution for L wrist before we try  Worked at counter: -Deep squats to simulate picking up objects from floor -partial lunge through RLE, with L knee supported at red therapy ball    Cues  for wider BOS to avoid excess forward lean  Min guard/supervision       HOME EXERCISE PROGRAM 12/15/24 Access Code: [email or web address] URL: [email or web address] Date: 12/15/2024 Prepared by: [facility] -  Outpatient  Rehab - [facility]  Exercises - Corner Balance Feet Together: Eyes Closed With Head Turns  - 1 x daily - 5 x weekly - 3 reps - 30 sec hold - Seated Hamstring Stretch  - 1 x daily - 5 x weekly - 1 sets - 3 reps - 30 sec hold - Standing Foot Lift on Box (BKA)  - 1 x daily - 5 x weekly - 2 sets - 10 reps - Standing Hip Abduction with Ankle Weight  - 1 x daily - 2-3 x weekly - 2 sets - 10 reps - Standing Hip Extension with Ankle Weight  - 1 x daily - 2-3 x weekly - 2 sets - 10 reps - Sit to Stand with Resistance Around Legs  - 1 x daily - 2-3 x weekly - 2 sets - 10 reps - Supine Bridge with Resistance Band  - 1 x daily - 2-3 x weekly - 2 sets - 10 reps - Sidelying Hip Abduction  - 1 x daily - 2-3 x weekly - 3 sets - 10 reps  PATIENT EDUCATION: Education details: Continue current HEP; rationale for progression to compliant surfaces and floor to stand transfer practice  Person educated: Patient Education method: Explanation, Demonstration, Tactile cues, Verbal cues, and Handouts Education comprehension: verbalized understanding and returned demonstration    -------------------------------------------- Note: Objective measures were completed at Evaluation unless otherwise noted.  DIAGNOSTIC FINDINGS: Interval proximal left femoral cephalomedullary nail fixation of the previously seen intertrochanteric fracture. Improved alignment.  COGNITION: Overall cognitive status: Within functional limits for tasks assessed   SENSATION: Reports decreased sensation in L fingers (to see hand surgeon)  EDEMA:  Noted LLE; wearing compression stocking  POSTURE: rounded shoulders and increased thoracic kyphosis  LOWER EXTREMITY ROM:     Active  Right Eval Left Eval  Hip flexion    Hip extension    Hip abduction    Hip adduction    Hip internal rotation    Hip external rotation    Knee flexion    Knee extension    Ankle dorsiflexion  10  Ankle plantarflexion  40   Ankle inversion    Ankle eversion     (Blank rows = not tested)  LOWER EXTREMITY MMT:    MMT Right Eval Left Eval  Hip flexion 4 3+  Hip extension    Hip abduction 5 4  Hip adduction 5 4  Hip internal rotation    Hip external rotation    Knee flexion 4+ 4+  Knee extension 4+ 4  Ankle dorsiflexion 4+ 4  Ankle plantarflexion    Ankle inversion    Ankle eversion    (Blank rows = not tested)  TRANSFERS: Sit to stand: Modified independence  Assistive device utilized: None     Stand to sit: Modified independence  Assistive device utilized: hands at chair      STAIRS: Findings: Level of Assistance: Modified independence, Stair Negotiation Technique: Step to Pattern with Single Rail on Right, Number of Stairs: 2-3, Height of Stairs: 4-6   , and Comments: prior to fracture, pt negotiated steps alternating pattern GAIT: Findings: Gait Characteristics: step through pattern, decreased step length- Right, and decreased stance time- Left, Distance walked: 50 ft x 2, Assistive device utilized:Walker - 2 wheeled and SPC with  small quad tip base, Level of assistance: Modified independence, CGA, and Min A, and Comments: Mod I with RW, CGA/min assist with cane  FUNCTIONAL TESTS:  10 meter walk test: 16.78 sec RW (1.95 ft/sec); 33.62 sec with cane (0.98 ft/sec) Berg:  34/56 (Scores <45/56 indicate increased fall risk)                                                                                                                                  TREATMENT DATE: 08/30/2024    PATIENT EDUCATION: Education details: Eval results, POC, continue HEP from HHPT; verbally added lateral weightshifting to LLE, with attention to light UE support Person educated: Patient Education method: Explanation, Demonstration, and Verbal cues Education comprehension: verbalized understanding, returned demonstration, and needs further education  HOME EXERCISE PROGRAM: Verbally initiated lateral  weightshifting; pt doing standing hip kicks, heel/toe raises, marching and sit to stand at home  GOALS: Goals reviewed with patient? Yes  SHORT TERM GOALS: Target date: 10/01/2024  Pt will be independent with HEP for improved gait, stair negotiation, balance. Baseline: no concerns on HEP  09/29/24 Goal status: IN PROGRESS 09/29/24  2.  Pt will improve Berg score to at least 45/56 to decrease fall risk. Baseline: 34/56>44/56 09/27/2024 Goal status: PARTIALLY MET 09/27/2024, MET 10/27/2024  3.  Pt will negotiate curb and 3 steps with cane, mod I, for improved household and community gait. Baseline: stairs with cane with CGA  09/29/24 Goal status: MET, 10/27/2024   LONG TERM GOALS: Target date: 10/29/2024>12/10/2024>UPDATED TARGET 01/07/2025  Pt will be independent with HEP for improved balance, strength, gait, stairs. Baseline: continuing to update Goal status: IN PROGRESS, 10/27/2024  2.  Pt will improve DGI score to at least 20/24 to decrease fall risk. Baseline: 11/24>16/24>18/24 12/13/24 Goal status:IN PROGRESS, 12/13/24  3.  Pt will improve gait velocity to at least 2.6 ft/sec with cane for improved gait efficiency and safety. Baseline: 0.98 ft/sec>2.1 ft/sec with cane 10/27/2024 (2.5 ft/sec no cane 12/13/2024 Goal status: IN PROGRESS, 12/13/2024  4.  Pt will ambulate at least 500 ft, indoor and outdoor gait, mod I with cane for improved community mobility. Baseline: community distances with daughter, per report Goal status: MET, 10/27/2024  5.  Pt will improve TUG score to less than or equal to 13.5 sec without device, for decreased fall risk.  Baseline:  TUG no cane 17.13 sec 10/27/24>15.5 sec 12/13/2024  Goal status:  IN PROGRESS 12/13/24  6.  Pt will improve TUG manual score to less than or equal to 15 sec for decreased fall risk.  Baseline:  19.87 sec 10/27/24; 17 sec 12/13/24  Goal status:  IN PROGRESS  7.  Pt will improve gait velocity *with no cane* to at least 2.3 ft/sec for  improved gait efficiency and safety.  Baseline:  1.82 ft/sec 10/27/24>2.51 ft/sec 12/13/2024  Goal status:  MET, 12/13/2024  8.  Pt will improve 5x sit<>stand to less than or  equal to 14.5 sec to demonstrate improved functional strength and transfer efficiency.  Baseline:  16.16 sec 10/27/24, 18.06 sec 12/13/24  Goal status:  IN PROGRESS    ASSESSMENT:  CLINICAL IMPRESSION: Pt presents today with reports of feeling good about HEP consolidation; also she had a good weekend with her daughter. Skilled PT session focused on review of some of pt's exercises, providing cues and answering questions.  Progressed with work on SLS and compliant surfaces.  Began problem-solving through floor>stand transfers, but given that pt will likely need BUE support to come to stand, we are awaiting word from OT about any precautions for L wrist. Pt will continue to benefit from skilled PT towards goals for improved functional mobility and decreased fall risk.  OBJECTIVE IMPAIRMENTS: Abnormal gait, decreased balance, decreased mobility, difficulty walking, and decreased strength.   ACTIVITY LIMITATIONS: standing, stairs, transfers, and locomotion level  PARTICIPATION LIMITATIONS: meal prep, cleaning, driving, shopping, community activity, yard work, and church  PERSONAL FACTORS: 3+ comorbidities: see PMH above are also affecting patient's functional outcome.   REHAB POTENTIAL: Good  CLINICAL DECISION MAKING: Evolving/moderate complexity  EVALUATION COMPLEXITY: Moderate  PLAN:  PT FREQUENCY: 2x/week  PT DURATION: 6 weeks per recert 10/27/2024  PLANNED INTERVENTIONS: 97750- Physical Performance Testing, 97110-Therapeutic exercises, 97530- Therapeutic activity, 97112- Neuromuscular re-education, 97535- Self Care, [id number]- Manual therapy, [phone]- Gait training, Patient/Family education, Balance training, Stair training, and DME instructions  PLAN FOR NEXT SESSION:  Floor to stand transfer (checking with OT about any  L wrist restrictions); SLS, glut activation in standing.  Compliant surface balance.  Gait training and balance training in clinic no device as appropriate.     Greig Anon, PT 12/20/2024 10:20 AM Phone: [phone] Fax: [phone]   [facility] at [facility] [address] [place], [address] [place], [place] [address] Phone # [phone] Fax # [phone]  "

## 2024-12-20 NOTE — Telephone Encounter (Signed)
 Monica Curry

## 2024-12-20 NOTE — Therapy (Signed)
 " OUTPATIENT OCCUPATIONAL THERAPY ORTHO Treatment Note  Patient Name: Monica Curry MRN: [id number] DOB:04-11-1931, 89 y.o., female Today's Date: 12/20/2024  PCP: Wendolyn Jenkins Jansky, MD  REFERRING PROVIDER: Arlinda Buster, MD  END OF SESSION:  OT End of Session - 12/20/24 1257     Visit Number 12    Number of Visits 21    Date for Recertification  01/21/25    Authorization Type [employer]    Authorization Time Period Auth#: [id number], approved 10 more OT visits from 12/17/24-03/17/25    Authorization - Visit Number 3    Authorization - Number of Visits 10    OT Start Time 1101    OT Stop Time 1145    OT Time Calculation (min) 44 min    Activity Tolerance Patient tolerated treatment well    Behavior During Therapy [facility] for tasks assessed/performed                    Past Medical History:  Diagnosis Date   Allergy    Medications and pollen   Anemia    Anxiety    Always   C. difficile colitis    Cancer (HCC)    Endometrial cancer   Cataract    Had surgery 2021   Chronic kidney disease 2018   2 siblings had kidney failure, sister had transplant   Clotting disorder    Essential and other specified forms of tremor    Hyperlipidemia    Hypertension    Osteoporosis 11/2019   T score -2.8 stable from prior DEXA   Personal history of venous thrombosis and embolism 1997   Pulmonary embolism (HCC) 06/14/2021   Pulmonary embolus (HCC)    Shingles    Stroke (HCC) 2018   TIA   Thrush    TIA (transient ischemic attack)    carotid US  07/2022: no ICA stenosis bilaterally   Past Surgical History:  Procedure Laterality Date   ABDOMINAL HYSTERECTOMY  1997   APPENDECTOMY  1961   CARPAL TUNNEL RELEASE Left 10/12/2024   cataract surg Bilateral 2021   Dr. Roz, R eye 8/18, L 8/25   CESAREAN SECTION  1961   EYE SURGERY  2021   Cataract both eyes   FRACTURE SURGERY  2025   Broken hip   INTRAMEDULLARY (IM) NAIL INTERTROCHANTERIC Left 04/28/2024    Procedure: FIXATION, FRACTURE, INTERTROCHANTERIC, WITH INTRAMEDULLARY ROD;  Surgeon: Kendal Franky SQUIBB, MD;  Location: [facility] OR;  Service: Orthopedics;  Laterality: Left;   TONSILLECTOMY AND ADENOIDECTOMY  1944   TOTAL ABDOMINAL HYSTERECTOMY W/ BILATERAL SALPINGOOPHORECTOMY  1997   Patient Active Problem List   Diagnosis Date Noted   RLS (restless legs syndrome) 06/15/2024   Fracture, Col[place], left, closed 04/28/2024   Hip fracture (HCC) 04/27/2024   GAD (generalized anxiety disorder) 04/01/2024   Thyroid  nodule 01/10/2022   Anxiety 08/10/2021   Aortic atherosclerosis (HCC) - CT 06/2021 06/15/2021   Bilateral renal cysts 07/06/2020   Paroxysmal supraventricular tachycardia 10/20/2019   History of skin cancer (basal cell) 06/16/2019   Complete right bundle branch block 06/16/2019   B12 deficiency 06/15/2019   History of TIA (transient ischemic attack) 10/23/2017   Chronic kidney disease, stage 3b (HCC) 07/21/2015   Medication management 07/04/2014   Essential hypertension 11/25/2013   Hyperlipidemia, mixed 11/25/2013   Abnormal glucose 11/25/2013   Vitamin D  deficiency 11/25/2013   Essential tremor 11/25/2013   History of endometrial cancer    Hx/o DVT/PE    Urethral prolapse  Osteoporosis 10/10/2011    ONSET DATE: referral date 10/07/24 (injury 04/27/24 and [facility] sx 10/12/24)  REFERRING DIAG: G56.02 (ICD-10-CM) - Carpal tunnel syndrome, left upper limb  THERAPY DIAG:  Muscle weakness (generalized)  Stiffness of left wrist, not elsewhere classified  Other lack of coordination  Other disturbances of skin sensation  Rationale for Evaluation and Treatment: Rehabilitation  SUBJECTIVE:   SUBJECTIVE STATEMENT: Pt asking about weight bearing to be about to focus on floor transfers with PT.    Pt accompanied by: self  PERTINENT HISTORY:   Per MD note 10/26/24: 89 y.o. female who presents today for follow up 2 weeks status post left wrist open carpal tunnel release.  Doing well  overall, pain controlled, does have persistent numbness and tingling.  Left hand: - Well-healing palmar incision, sutures removed, skin edges well-approximated without erythema or drainage - Composite fist limited secondary to stiffness, improved passively - Sensation remains diminished to light touch in the median nerve distribution - 4/5 APB mild thenar atrophy, thumb opposition to ring finger PIP  PRECAUTIONS: Fall  WEIGHT BEARING RESTRICTIONS: No  PAIN:  Are you having pain? Yes: NPRS scale: minimal Pain location: palm of hand Pain description: twinge Aggravating factors: touching hand around incision Relieving factors: being still  FALLS: Has patient fallen in last 6 months? Yes. Number of falls 1 - fall in May that resulted in hip and wrist fractures.  LIVING ENVIRONMENT: Lives with: lives alone Lives in: House/apartment Stairs: 3 steps to enter, flight of steps to the basement but she does not go down them Has following equipment at home: [name] - 2 wheeled, single point cane, shower chair, and Grab bars  PLOF: Independent with basic ADLs and Requires assistive device for independence; step-daughter is currently doing laundry at her home as pt's washer/dryer are in the basement  PATIENT GOALS: to be able to use Left hand again   NEXT MD VISIT: 11/23/24  OBJECTIVE:  Note: Objective measures were completed at Evaluation unless otherwise noted.  HAND DOMINANCE: Right  ADLs: WFL; difficulty with donning compression sock on L leg but is able to don  FUNCTIONAL OUTCOME MEASURES: Quick Dash: 50%   12/07/24   UPPER EXTREMITY ROM:     Active ROM Right eval Left eval Left 11/22/24 Left 12/07/24  Shoulder flexion      Shoulder abduction      Shoulder adduction      Shoulder extension      Shoulder internal rotation      Shoulder external rotation      Elbow flexion      Elbow extension      Wrist flexion 75 34 30 38  Wrist extension 60 17 40 36  Wrist  ulnar deviation 30 18 22 24   Wrist radial deviation 12 10 8 12   Wrist pronation  WFL    Wrist supination  80%  100%  (Blank rows = not tested)  Loose grasp on L, however all fingers are able to touch palm of hand lightly in open fist, decreased full fist, hook fist, and table top  HAND FUNCTION: Not assessed due to stitches just removed yesterday.  On eval prior to surgery 08/30/24: Right: 40 lbs; Left: 5 lbs  12/07/24: Grip strength:  COORDINATION: 9 Hole Peg test: Right: 30.13 sec; Left: 61.75 sec; Mild B tremors  11/22/24: left: 39.56 sec  12/07/24 Left: 37.21 sec; min-moderate tremors this session  SENSATION: Numbness and tingling in L hand and digits along median nerve distribution  EDEMA: NA  COGNITION: Overall cognitive status: Within functional limits for tasks assessed   OBSERVATIONS: Pt L wrist at ulnar head and dorsal wrist enlarged compared to R wrist and hand due to bone deformity at dorsal wrist and ulnar styloid s/p setting of fracture back in May 2025. Pt demonstrating ~50% movement in all directions when compared to R, but verbalizing good understanding of exercises.    TREATMENT DATE:  12/20/24 ROM: engaged in ball exercises with L hand, rotating ball clockwise and counter-clockwise in hand x5-10 each direction x2.  Transitioned to squeezing ball and holding ~20 seconds at a time for grip strength. BUE strengthening: engaged in counter top pushups x10 with focus on UB strengthening and increasing symmetry.  Pt still biasing weight shift and increased effort through RUE due to decreased L wrist extension.  Modified to BUE on wall to decrease wrist extension while still allowing for UB strengthening with pt tolerating modified position. Strengthening: engaged in supination/pronation, elbow flexion, overhead extension, and tricep extension with 1# dumbbell.  OT providing demonstration and rationale to carryover of UE strengthening as needed for ADLs and IADLs.      12/17/24 Heat: applied moist heat to L wrist prior to engaging in wrist ROM and strengthening.  Pt with skin intact pre and post heat.   AAROM: engaged L wrist flexion/extension with OT providing overpressure to facilitate increased stretch and ROM. Wrist ROM: flexion/extension with weighted ball with cues to sustain hold 2-3 seconds at end range.  OT providing demonstration and min cues for technique. Utilized flex bar to attempt ulnar/radial deviation.  OT providing demonstration and cues, pt demonstrating min difficulty with technique.  OT educating on norms and comparing previous measurements to RUE ROM.  Engaged in circumduction with 2# dumbbell in clockwise and counter-clockwise directions followed by flexion/extension with 2# dumbbell.   12/10/24 Heat applied as preparatory activity with skin in tact pre and post treatment prior to ROM and strengthening. Education for joint protection during moist heat. OT performing soft tissue massage/scar massage briefly for L wrist with no c/o pain. Guided through tendon glides for L hand, OT demonstrating and assisting with form and technique.  AAROM for L wrist for ulnar/radial deviation, wrist flex/ext, circumduction before upgrading to mild resistance strengthening with 1# and upgraded to 2# handweight for the above movements with towel under forearm for support in table top, mild overpressure as able to tolerate.  Grip strengthening: light blue resistance gripper for composite grip for 1x10 with 3 second hold. Educated on sensitization techniques and brain/body connection, pt not really interested in textures or sensitization techniques this date.   12/08/24 Heat: applied moist heat to L wrist in preparation for ROM exercises.  OT reiterating education on checking skin prior to and post heat for skin integrity.  Completed QuickDASH while heat on L wrist. Re-assessed goals, see above objective and below goals section for further details.  ROM:  engaged in AAROM wrist flexion with overpressure with arm positioned on edge of table top to allow gravity to aid in ROM and stretch.  Reiterated use of 1# weight to aid in ROM as well as strengthening of grip. Grip strength: engaged in full grasp on stress ball with hold for 5 seconds with each repetition.  OT encouraged pt to attempt increased resistance theraputty as pt reporting having 2 strengths at home and recommended that she bring it to next session. Hand Gripper: with LUE on level 5# with green spring. Pt picked up 1 inch blocks  with gripper with 2 drops and min-mod difficulty.  Pt demonstrating difficulty with task largely due to tremors impacting sustained grasp with movement as well as decreased grip strength even on lowest setting.      PATIENT EDUCATION: Education details: heat, massage, ROM Person educated: Patient Education method: Explanation, Demonstration, Verbal cues, and Handouts Education comprehension: verbalized understanding and needs further education  HOME EXERCISE PROGRAM: Carpal tunnel release HEP (see pt instructions)  Access Code: [email or web address] URL: [email or web address] Date: 12/20/2024 Prepared by: [facility]  Re[facility] - [facility]  Exercises - Putty Squeezes  - 2 x daily - 15 reps - 3-Point Pinch with Putty  - 2 x daily - 15 reps - Finger Lumbricals with Putty  - 2 x daily - 15 reps - Seated Wrist Extension Stretch  - 2 x daily - 5-10 reps - Seated Wrist Flexion Stretch  - 2 x daily - 5-10 reps - Seated Wrist Prayer Stretch  - 2 x daily - 10 reps - ~20 sec hold - Push-Up on Counter  - 2 x daily - 10 reps - Seated Forearm Pronation and Supination with Hammer  - 2 x daily - 10 reps - Seated Single Arm Bicep Curls with Rotation and Dumbbell  - 2 x daily - 10 reps - Seated Overhead Press  - 2 x daily - 10 reps - Seated Overhead Elbow Extension  - 2 x daily - 10 reps  GOALS: Goals reviewed with patient? Yes   SHORT TERM GOALS:  Target date: 12/31/24  Pt will be independent with HEP for ROM and strengthening. Baseline: new to OPOT Goal status: in progress   2.  Pt will verbalize understanding of task modifications and/or potential A/E needs to increase ease, safety, and independence w/ ADLs. Baseline: difficulty with donning compression socks d/t decreased grip and mobility of L wrist/hand Goal status: in progress  3.  Pt will verbalize understanding of re-sensitization strategies as well as compensatory strategies due to impaired sensation.  Baseline: impaired sensation in hand at median nerve distribution  Goal status: in progress    LONG TERM GOALS: Target date: 01/21/25  1.  Pt will improve grip strength in left hand to be within at least 20 pounds of her right hand for functional use at home and in IADLs.  Baseline: L: 5# and R: 40# 12/07/24: L: 7.5# Goal status: in progress   2.  Pt will demonstrate improved L wrist flexion/extension to 75% of R wrist flexion/extension for improved functional use.  Baseline: R:75/60 and L: 34/17 12/07/24: L: 38/36 Goal status: in progress   3.  Pt will demonstrate improvements in pain and functional use of LUE as evidenced by decreased score on QuickDASH from 50% impairement to < 30 % impairment.  Baseline: 50% 12/07/24: 40.9% Goal status: in progress   ASSESSMENT:  CLINICAL IMPRESSION: Patient is a 89 y.o. female who was seen today for occupational therapy treatment s/p L carpal tunnel release. Pt demonstrating increased tolerance of WB through BUE at counter top and wall for modified pushups to increase ROM while addressing UB strengthening overall.  Pt tolerating elbow flexion/extension, over head press, and tricep extension with 1# dumbbell in LUE.  OT providing education on flexion and extension through full ROM as tolerated.  Educated on terminating tasks if onset of pain. Pt will continue to benefit from skilled OT services to address ROM, sensation,  strengthening, and improved functional use of LUE.  PERFORMANCE DEFICITS: in functional skills including ADLs, IADLs,  coordination, sensation, edema, ROM, strength, flexibility, Fine motor control, Gross motor control, body mechanics, endurance, decreased knowledge of precautions, and UE functional use and psychosocial skills including routines and behaviors.      PLAN:  OT FREQUENCY: 1-2x/week  OT DURATION: 6 weeks  PLANNED INTERVENTIONS: 97168 OT Re-evaluation, 97535 self care/ADL training, 02889 therapeutic exercise, 97530 therapeutic activity, 97112 neuromuscular re-education, 97140 manual therapy, 97035 ultrasound, 97018 paraffin, 02239 Orthotic Initial, 97763 Orthotic/Prosthetic subsequent, passive range of motion, compression bandaging, coping strategies training, patient/family education, and DME and/or AE instructions   RECOMMENDED OTHER SERVICES: NA  CONSULTED AND AGREED WITH PLAN OF CARE: Patient  PLAN FOR NEXT SESSION: Continue use of modalities for ROM and pain relief, after 4-6 weeks s/p sx can start light strengthening, progress STGs/LTGs  Review UB strengthening, may even introduce theraband HEP if appropriate     KAYLENE DOMINO, OTR/L 12/20/2024, 12:57 PM  [facility] [address] [place], [place] [address] Phone # [phone] Fax # [phone]  "

## 2024-12-20 NOTE — Telephone Encounter (Signed)
 Pt ready for scheduling for PROLIA  on or after : 01/15/25  Option# 1: Buy/Bill (Office supplied medication)  Out-of-pocket cost due at time of clinic visit: $377 (following Medicare Advantage guideline (20% coinsurance, 20% admin fee)   Number of injection/visits approved: 2  Primary: [employer] Prolia  co-insurance: 20% Admin fee co-insurance: 20%  Secondary: --- Prolia  co-insurance:  Admin fee co-insurance:   Medical Benefit Details: Date Benefits were checked: 12/16/24 Deductible: NO/ Coinsurance: 20%/ Admin Fee: 20%  Prior Auth: APPROVED PA# [id number] Expiration Date: 12/09/24-12/08/25   # of doses approved: 2 ----------------------------------------------------------------------- Option# 2- Med Obtained from pharmacy:  Pharmacy benefit: Copay $64 (Paid to pharmacy) Admin Fee: 20% (Pay at clinic)  Prior Auth: N/A PA# Expiration Date:   # of doses approved:   If patient wants fill through the pharmacy benefit please send prescription to: [facility], and include estimated need by date in rx notes. Pharmacy will ship medication directly to the office.  Patient NOT eligible for Prolia  Copay Card. Copay Card can make patient's cost as little as $25. Link to apply: [email or web address]  ** This summary of benefits is an estimation of the patient's out-of-pocket cost. Exact cost may very based on individual plan coverage.

## 2024-12-22 ENCOUNTER — Encounter: Payer: Self-pay | Admitting: Physical Therapy

## 2024-12-22 ENCOUNTER — Encounter: Payer: Self-pay | Admitting: Family Medicine

## 2024-12-22 ENCOUNTER — Other Ambulatory Visit: Payer: Self-pay

## 2024-12-22 ENCOUNTER — Ambulatory Visit

## 2024-12-22 ENCOUNTER — Ambulatory Visit: Admitting: Physical Therapy

## 2024-12-22 DIAGNOSIS — M810 Age-related osteoporosis without current pathological fracture: Secondary | ICD-10-CM

## 2024-12-22 DIAGNOSIS — M25632 Stiffness of left wrist, not elsewhere classified: Secondary | ICD-10-CM | POA: Diagnosis not present

## 2024-12-22 DIAGNOSIS — R2681 Unsteadiness on feet: Secondary | ICD-10-CM

## 2024-12-22 DIAGNOSIS — M6281 Muscle weakness (generalized): Secondary | ICD-10-CM

## 2024-12-22 DIAGNOSIS — R2689 Other abnormalities of gait and mobility: Secondary | ICD-10-CM

## 2024-12-22 MED ORDER — DENOSUMAB-BBDZ 60 MG/ML ~~LOC~~ SOSY: 60.0000 mg | PREFILLED_SYRINGE | Freq: Once | SUBCUTANEOUS | Status: AC

## 2024-12-22 NOTE — Therapy (Signed)
 " OUTPATIENT PHYSICAL THERAPY NEURO TREATMENT NOTE  Patient Name: Monica Curry MRN: [id number] DOB:[birth year], 89 y.o., female Today's Date: 12/22/2024   PCP: Wendolyn Jenkins Jansky, MD REFERRING PROVIDER: Danton Lauraine LABOR, PA-C       END OF SESSION:  PT End of Session - 12/22/24 1150     Visit Number 26    Number of Visits 30    Date for Recertification  01/07/25    Authorization Type [employer]-reauth submitted 12/13/2024    Authorization Time Period approved 7 more PT visits from 12/13/24-01/07/25    Authorization - Visit Number 4    Authorization - Number of Visits 7    Progress Note Due on Visit 30    PT Start Time 1150    PT Stop Time 1210    PT Time Calculation (min) 20 min    Equipment Utilized During Treatment Gait belt    Activity Tolerance Patient tolerated treatment well    Behavior During Therapy WFL for tasks assessed/performed                             Past Medical History:  Diagnosis Date   Allergy    Medications and pollen   Anemia    Anxiety    Always   C. difficile colitis    Cancer (HCC)    Endometrial cancer   Cataract    Had surgery 2021   Chronic kidney disease 2018   2 siblings had kidney failure, sister had transplant   Clotting disorder    Essential and other specified forms of tremor    Hyperlipidemia    Hypertension    Osteoporosis 11/2019   T score -2.8 stable from prior DEXA   Personal history of venous thrombosis and embolism 1997   Pulmonary embolism (HCC) 06/14/2021   Pulmonary embolus (HCC)    Shingles    Stroke (HCC) 2018   TIA   Thrush    TIA (transient ischemic attack)    carotid US  07/2022: no ICA stenosis bilaterally   Past Surgical History:  Procedure Laterality Date   ABDOMINAL HYSTERECTOMY  1997   APPENDECTOMY  1961   CARPAL TUNNEL RELEASE Left 10/12/2024   cataract surg Bilateral 2021   Dr. Roz, R eye 8/18, L 8/25   CESAREAN SECTION  1961   EYE SURGERY  2021   Cataract both eyes    FRACTURE SURGERY  2025   Broken hip   INTRAMEDULLARY (IM) NAIL INTERTROCHANTERIC Left 04/28/2024   Procedure: FIXATION, FRACTURE, INTERTROCHANTERIC, WITH INTRAMEDULLARY ROD;  Surgeon: Kendal Franky SQUIBB, MD;  Location: [facility] OR;  Service: Orthopedics;  Laterality: Left;   TONSILLECTOMY AND ADENOIDECTOMY  1944   TOTAL ABDOMINAL HYSTERECTOMY W/ BILATERAL SALPINGOOPHORECTOMY  1997   Patient Active Problem List   Diagnosis Date Noted   RLS (restless legs syndrome) 06/15/2024   Fracture, [place], left, closed 04/28/2024   Hip fracture (HCC) 04/27/2024   GAD (generalized anxiety disorder) 04/01/2024   Thyroid  nodule 01/10/2022   Anxiety 08/10/2021   Aortic atherosclerosis (HCC) - CT 06/2021 06/15/2021   Bilateral renal cysts 07/06/2020   Paroxysmal supraventricular tachycardia 10/20/2019   History of skin cancer (basal cell) 06/16/2019   Complete right bundle branch block 06/16/2019   B12 deficiency 06/15/2019   History of TIA (transient ischemic attack) 10/23/2017   Chronic kidney disease, stage 3b (HCC) 07/21/2015   Medication management 07/04/2014   Essential hypertension 11/25/2013   Hyperlipidemia, mixed 11/25/2013  Abnormal glucose 11/25/2013   Vitamin D  deficiency 11/25/2013   Essential tremor 11/25/2013   History of endometrial cancer    Hx/o DVT/PE    Urethral prolapse    Osteoporosis 10/10/2011    ONSET DATE: 04/28/2024  REFERRING DIAG: S/P L intertrachanteric femur fracture, Left distal radius fracture 04/28/2024   THERAPY DIAG:  Muscle weakness (generalized)  Other abnormalities of gait and mobility  Unsteadiness on feet  Rationale for Evaluation and Treatment: Rehabilitation  SUBJECTIVE:                                                                                                                                                                                             SUBJECTIVE STATEMENT: Had a little soreness in L hip, but it's better.  Pt accompanied by:  self  PERTINENT HISTORY: osteoporosis, hx of DVT; see additional PMH above  PAIN:  Are you having pain? No   PRECAUTIONS: Fall, wears L compression stocking due to L LE edema (was negative for DVT)  **CTR surgery LUE 10/12/24:  wearing bandage for 5 days, no lifting, to return to MD 11/18 for removal of stitches, follow up  RED FLAGS: None   WEIGHT BEARING RESTRICTIONS: No  FALLS: Has patient fallen in last 6 months? Yes. Number of falls 1  LIVING ENVIRONMENT: Lives with: lives alone Lives in: House/apartment Stairs: 3 steps to enter home;  Has following equipment at home: Single point cane and Walker - 2 wheeled *cane has tripod base and has gotten quad tip (per 10/28 visit) PLOF: Independent  PATIENT GOALS: To be able to transition to the cane, so I can get in and out of home better. 12/13/24:  To be able to maneuver better while carrying things.    OBJECTIVE:    TODAY'S TREATMENT: 12/22/2024 Activity Comments  Floor to stand transfer:  stand <>1/2 kneel<>tall kneel  UE support and min assist>min guard Cues for technique Performed 2 reps  Discussed safety with transfer technique from floor, fall prevention, pt has phone and call button   Pt requests to use restroom (approx 8 minutes in restroom) *Reports she is having upset stomach/stomach cramps and requests to end session so she can get home*                    TODAY'S TREATMENT: 12/20/2024 Activity Comments  Verbally reviewed most of HEP Return demo seated hamstring stretch and sit to stand stagger stance with red band at knees  Reminder cues  SLS activities:   -step tap/hover to 4 step, 2 x 5 reps each, then standing on [employer]ire[employer], step tap and hover  to 6 step, 2 x 5 -Standing on solid ground-multi direction steptaps Light>intermittent UE support  Corner balance on foam:   EO and EC head turns/head nods with feet apart/together Mild/mod sway  Pt asks about floor to stand transfer, which we discussed  Checked with  OT about any restrictions/precaution for L wrist before we try  Worked at counter: -Deep squats to simulate picking up objects from floor -partial lunge through RLE, with L knee supported at red therapy ball    Cues for wider BOS to avoid excess forward lean  Min guard/supervision       HOME EXERCISE PROGRAM 12/15/24 Access Code: [email or web address] URL: [email or web address] Date: 12/15/2024 Prepared by: [facility] - Outpatient  Rehab - [facility]  Exercises - Corner Balance Feet Together: Eyes Closed With Head Turns  - 1 x daily - 5 x weekly - 3 reps - 30 sec hold - Seated Hamstring Stretch  - 1 x daily - 5 x weekly - 1 sets - 3 reps - 30 sec hold - Standing Foot Lift on Box (BKA)  - 1 x daily - 5 x weekly - 2 sets - 10 reps - Standing Hip Abduction with Ankle Weight  - 1 x daily - 2-3 x weekly - 2 sets - 10 reps - Standing Hip Extension with Ankle Weight  - 1 x daily - 2-3 x weekly - 2 sets - 10 reps - Sit to Stand with Resistance Around Legs  - 1 x daily - 2-3 x weekly - 2 sets - 10 reps - Supine Bridge with Resistance Band  - 1 x daily - 2-3 x weekly - 2 sets - 10 reps - Sidelying Hip Abduction  - 1 x daily - 2-3 x weekly - 3 sets - 10 reps  PATIENT EDUCATION: Education details: floor to stand transfer practice technique Person educated: Patient Education method: Explanation, Demonstration, Tactile cues, Verbal cues, and Handouts Education comprehension: verbalized understanding and returned demonstration    -------------------------------------------- Note: Objective measures were completed at Evaluation unless otherwise noted.  DIAGNOSTIC FINDINGS: Interval proximal left femoral cephalomedullary nail fixation of the previously seen intertrochanteric fracture. Improved alignment.  COGNITION: Overall cognitive status: Within functional limits for tasks assessed   SENSATION: Reports decreased sensation in L fingers (to see hand surgeon)  EDEMA:  Noted LLE;  wearing compression stocking  POSTURE: rounded shoulders and increased thoracic kyphosis  LOWER EXTREMITY ROM:     Active  Right Eval Left Eval  Hip flexion    Hip extension    Hip abduction    Hip adduction    Hip internal rotation    Hip external rotation    Knee flexion    Knee extension    Ankle dorsiflexion  10  Ankle plantarflexion  40  Ankle inversion    Ankle eversion     (Blank rows = not tested)  LOWER EXTREMITY MMT:    MMT Right Eval Left Eval  Hip flexion 4 3+  Hip extension    Hip abduction 5 4  Hip adduction 5 4  Hip internal rotation    Hip external rotation    Knee flexion 4+ 4+  Knee extension 4+ 4  Ankle dorsiflexion 4+ 4  Ankle plantarflexion    Ankle inversion    Ankle eversion    (Blank rows = not tested)  TRANSFERS: Sit to stand: Modified independence  Assistive device utilized: None     Stand to sit: Modified independence  Assistive device utilized: hands at  chair      STAIRS: Findings: Level of Assistance: Modified independence, Stair Negotiation Technique: Step to Pattern with Single Rail on Right, Number of Stairs: 2-3, Height of Stairs: 4-6   , and Comments: prior to fracture, pt negotiated steps alternating pattern GAIT: Findings: Gait Characteristics: step through pattern, decreased step length- Right, and decreased stance time- Left, Distance walked: 50 ft x 2, Assistive device utilized:Walker - 2 wheeled and SPC with small quad tip base, Level of assistance: Modified independence, CGA, and Min A, and Comments: Mod I with RW, CGA/min assist with cane  FUNCTIONAL TESTS:  10 meter walk test: 16.78 sec RW (1.95 ft/sec); 33.62 sec with cane (0.98 ft/sec) Berg:  34/56 (Scores <45/56 indicate increased fall risk)                                                                                                                                  TREATMENT DATE: 08/30/2024    PATIENT EDUCATION: Education details: Eval results, POC,  continue HEP from HHPT; verbally added lateral weightshifting to LLE, with attention to light UE support Person educated: Patient Education method: Explanation, Demonstration, and Verbal cues Education comprehension: verbalized understanding, returned demonstration, and needs further education  HOME EXERCISE PROGRAM: Verbally initiated lateral weightshifting; pt doing standing hip kicks, heel/toe raises, marching and sit to stand at home  GOALS: Goals reviewed with patient? Yes  SHORT TERM GOALS: Target date: 10/01/2024  Pt will be independent with HEP for improved gait, stair negotiation, balance. Baseline: no concerns on HEP  09/29/24 Goal status: IN PROGRESS 09/29/24  2.  Pt will improve Berg score to at least 45/56 to decrease fall risk. Baseline: 34/56>44/56 09/27/2024 Goal status: PARTIALLY MET 09/27/2024, MET 10/27/2024  3.  Pt will negotiate curb and 3 steps with cane, mod I, for improved household and community gait. Baseline: stairs with cane with CGA  09/29/24 Goal status: MET, 10/27/2024   LONG TERM GOALS: Target date: 10/29/2024>12/10/2024>UPDATED TARGET 01/07/2025  Pt will be independent with HEP for improved balance, strength, gait, stairs. Baseline: continuing to update Goal status: IN PROGRESS, 10/27/2024  2.  Pt will improve DGI score to at least 20/24 to decrease fall risk. Baseline: 11/24>16/24>18/24 12/13/24 Goal status:IN PROGRESS, 12/13/24  3.  Pt will improve gait velocity to at least 2.6 ft/sec with cane for improved gait efficiency and safety. Baseline: 0.98 ft/sec>2.1 ft/sec with cane 10/27/2024 (2.5 ft/sec no cane 12/13/2024 Goal status: IN PROGRESS, 12/13/2024  4.  Pt will ambulate at least 500 ft, indoor and outdoor gait, mod I with cane for improved community mobility. Baseline: community distances with daughter, per report Goal status: MET, 10/27/2024  5.  Pt will improve TUG score to less than or equal to 13.5 sec without device, for decreased fall  risk.  Baseline:  TUG no cane 17.13 sec 10/27/24>15.5 sec 12/13/2024  Goal status:  IN PROGRESS 12/13/24  6.  Pt will improve TUG manual  score to less than or equal to 15 sec for decreased fall risk.  Baseline:  19.87 sec 10/27/24; 17 sec 12/13/24  Goal status:  IN PROGRESS  7.  Pt will improve gait velocity *with no cane* to at least 2.3 ft/sec for improved gait efficiency and safety.  Baseline:  1.82 ft/sec 10/27/24>2.51 ft/sec 12/13/2024  Goal status:  MET, 12/13/2024  8.  Pt will improve 5x sit<>stand to less than or equal to 14.5 sec to demonstrate improved functional strength and transfer efficiency.  Baseline:  16.16 sec 10/27/24, 18.06 sec 12/13/24  Goal status:  IN PROGRESS    ASSESSMENT:  CLINICAL IMPRESSION: Pt presents today reporting some mild L hip pain that has improved since last session. Skilled PT session focused on partial floor to stand transfer (from stand<>tall kneeling) with BUE support (LUE as tolerated, per OT report) and min assist>min guard.  Discussed options for safety for getting up from floor and how to scoot to a sturdy surface to push up from.  Pt requests to go to the restroom and ultimately notes she is having stomach issues, requesting session end early so she can go home.  Pt will continue to benefit from skilled PT towards goals for improved functional mobility and decreased fall risk.  OBJECTIVE IMPAIRMENTS: Abnormal gait, decreased balance, decreased mobility, difficulty walking, and decreased strength.   ACTIVITY LIMITATIONS: standing, stairs, transfers, and locomotion level  PARTICIPATION LIMITATIONS: meal prep, cleaning, driving, shopping, community activity, yard work, and church  PERSONAL FACTORS: 3+ comorbidities: see PMH above are also affecting patient's functional outcome.   REHAB POTENTIAL: Good  CLINICAL DECISION MAKING: Evolving/moderate complexity  EVALUATION COMPLEXITY: Moderate  PLAN:  PT FREQUENCY: 2x/week  PT DURATION: 6 weeks per  recert 10/27/2024  PLANNED INTERVENTIONS: 97750- Physical Performance Testing, 97110-Therapeutic exercises, 97530- Therapeutic activity, 97112- Neuromuscular re-education, 97535- Self Care, 02859- Manual therapy, [phone]- Gait training, Patient/Family education, Balance training, Stair training, and DME instructions  PLAN FOR NEXT SESSION:  Floor to stand transfer again if pt wants to try again; SLS, glut activation in standing.  Compliant surface balance.  Gait training and balance training in clinic no device as appropriate, with carrying tasks.     Greig Anon, PT 12/22/2024 12:21 PM Phone: [phone] Fax: [phone]   [facility] at [facility] [address] [place], [address] [place], [place] [address] Phone # [phone] Fax # [phone]  "

## 2024-12-23 ENCOUNTER — Encounter: Payer: Self-pay | Admitting: Family Medicine

## 2024-12-27 ENCOUNTER — Telehealth: Payer: Self-pay

## 2024-12-27 NOTE — Telephone Encounter (Signed)
 Copied from CRM [id number]. Topic: Clinical - Medical Advice >> Dec 27, 2024 10:48 AM Viola F wrote: Reason for CRM: Patient has cold, cough, runny nose - appt 12/28/24 and she wants to know if she needs to reschedule because of her symptoms? Please call her at [phone]  Spoke with pt to let her know that she will be able to be seen tomorrow just make sure that she has a mask on or ask for a mask at the front office. Pt understood.  Avelina Situ

## 2024-12-28 ENCOUNTER — Other Ambulatory Visit: Payer: Self-pay | Admitting: *Deleted

## 2024-12-28 ENCOUNTER — Ambulatory Visit: Admitting: Family Medicine

## 2024-12-28 ENCOUNTER — Encounter: Payer: Self-pay | Admitting: Family Medicine

## 2024-12-28 VITALS — BP 128/76 | HR 74 | Temp 97.2°F | Ht 63.5 in | Wt 131.0 lb

## 2024-12-28 DIAGNOSIS — M8000XD Age-related osteoporosis with current pathological fracture, unspecified site, subsequent encounter for fracture with routine healing: Secondary | ICD-10-CM | POA: Diagnosis not present

## 2024-12-28 DIAGNOSIS — J069 Acute upper respiratory infection, unspecified: Secondary | ICD-10-CM | POA: Diagnosis not present

## 2024-12-28 DIAGNOSIS — Z8673 Personal history of transient ischemic attack (TIA), and cerebral infarction without residual deficits: Secondary | ICD-10-CM | POA: Diagnosis not present

## 2024-12-28 DIAGNOSIS — M810 Age-related osteoporosis without current pathological fracture: Secondary | ICD-10-CM

## 2024-12-28 DIAGNOSIS — I10 Essential (primary) hypertension: Secondary | ICD-10-CM

## 2024-12-28 MED ORDER — DENOSUMAB 60 MG/ML ~~LOC~~ SOSY: 60.0000 mg | PREFILLED_SYRINGE | Freq: Once | SUBCUTANEOUS | Status: AC

## 2024-12-28 NOTE — Patient Instructions (Addendum)
 It was very nice to see you today!  Claritin Mucinex dm  Get new arm cuff   PLEASE NOTE:  If you had any lab tests please let us  know if you have not heard back within a few days. You may see your results on MyChart before we have a chance to review them but we will give you a call once they are reviewed by us . If we ordered any referrals today, please let us  know if you have not heard from their office within the next week.   Please try these tips to maintain a healthy lifestyle:  Eat most of your calories during the day when you are active. Eliminate processed foods including packaged sweets (pies, cakes, cookies), reduce intake of potatoes, white bread, white pasta, and white rice. Look for whole grain options, oat flour or almond flour.  Each meal should contain half fruits/vegetables, one quarter protein, and one quarter carbs (no bigger than a computer mouse).  Cut down on sweet beverages. This includes juice, soda, and sweet tea. Also watch fruit intake, though this is a healthier sweet option, it still contains natural sugar! Limit to 3 servings daily.  Drink at least 1 glass of water with each meal and aim for at least 8 glasses per day  Exercise at least 150 minutes every week.

## 2024-12-28 NOTE — Progress Notes (Signed)
 "  Subjective:     Patient ID: Monica Curry, female    DOB: 1931-10-27, 89 y.o.   MRN: [id number]  Chief Complaint  Patient presents with   Hypertension    Pt is here for BP    Discussed the use of AI scribe software for clinical note transcription with the patient, who gave verbal consent to proceed.  History of Present Illness Monica Curry is a 89 year old female with hypertension who presents for follow-up on blood pressure management.  She has been transitioned from a multi-dose daily propranolol  regimen to a once-daily regimen. Her home blood pressure readings have been inconsistent, with some readings as high as 181/44 and 188/103, which she attributes to potential issues with her blood pressure cuff. A recent measurement recorded her blood pressure as 128/76, which she found satisfactory. She currently takes propranolol  with her evening meal and olmesartan  20mg  dose in the morning with breakfast.  She has a history of a transient ischemic attack (TIA) in 2018 and is concerned about a recent episode of momentary speech interruption, reminiscent of her TIA. No other symptoms such as vision problems, facial issues, or numbness in the arms are present.  She mentions pleural effusions that were first identified in 2022. She recalls a past pulmonary embolism in 2022.  Recently, she developed a cold with symptoms starting last week, including a scratchy throat, dry cough, and rhinorrhea. The cough became productive over the weekend. She manages symptoms with Claritin and Tylenol . She experienced a slight fever of 96F yesterday but none today. She is cautious about taking over-the-counter medications that might affect her blood pressure.    Health Maintenance Due  Topic Date Due   Mammogram  01/08/2025    Past Medical History:  Diagnosis Date   Allergy    Medications and pollen   Anemia    Anxiety    Always   C. difficile colitis    Cancer [facility])    Endometrial cancer    Cataract    Had surgery 2021   Chronic kidney disease 2018   2 siblings had kidney failure, sister had transplant   Clotting disorder    Essential and other specified forms of tremor    Hyperlipidemia    Hypertension    Osteoporosis 11/2019   T score -2.8 stable from prior DEXA   Personal history of venous thrombosis and embolism 1997   Pulmonary embolism (HCC) 06/14/2021   Pulmonary embolus (HCC)    Shingles    Stroke (HCC) 2018   TIA   Thrush    TIA (transient ischemic attack)    carotid US  07/2022: no ICA stenosis bilaterally    Past Surgical History:  Procedure Laterality Date   ABDOMINAL HYSTERECTOMY  1997   APPENDECTOMY  1961   CARPAL TUNNEL RELEASE Left 10/12/2024   cataract surg Bilateral 2021   Dr. Roz, R eye 8/18, L 8/25   CESAREAN SECTION  1961   EYE SURGERY  2021   Cataract both eyes   FRACTURE SURGERY  2025   Broken hip   INTRAMEDULLARY (IM) NAIL INTERTROCHANTERIC Left 04/28/2024   Procedure: FIXATION, FRACTURE, INTERTROCHANTERIC, WITH INTRAMEDULLARY ROD;  Surgeon: Kendal Franky SQUIBB, MD;  Location: [facility] OR;  Service: Orthopedics;  Laterality: Left;   TONSILLECTOMY AND ADENOIDECTOMY  1944   TOTAL ABDOMINAL HYSTERECTOMY W/ BILATERAL SALPINGOOPHORECTOMY  1997    Current Medications[1]  Allergies[2] ROS neg/noncontributory except as noted HPI/below      Objective:     BP 128/76 (  BP Location: Left Arm, Patient Position: Sitting, Cuff Size: Normal)   Pulse 74   Temp (!) 97.2 F (36.2 C) (Temporal)   Ht 5' 3.5 (1.613 m)   Wt 131 lb (59.4 kg)   SpO2 97%   BMI 22.84 kg/m  Wt Readings from Last 3 Encounters:  12/28/24 131 lb (59.4 kg)  12/13/24 135 lb 4 oz (61.3 kg)  10/19/24 133 lb 3.2 oz (60.4 kg)    Physical Exam VITALS: BP- 132/82 GENERAL: Well developed, well nourished, no acute distress. HEAD EYES EARS NOSE THROAT: Normocephalic, atraumatic, conjunctiva not injected, sclera nonicteric, nose runny, oropharynx clear and moist with exudates. TM  WNL B CARDIAC: Regular rate and rhythm, S1 S2 present, no murmur, dorsalis pedis 2 plus bilaterally. NECK: Supple, no thyromegaly, no lymphadenopathy LUNGS: Clear to auscultation bilaterally, no wheezes. MUSCULOSKELETAL: No gross abnormalities. NEUROLOGICAL: Alert and oriented x3, cranial nerves II through XII intact. Mild tremors PSYCHIATRIC: Normal mood, good eye contact.       Assessment & Plan:  Essential hypertension  History of TIA (transient ischemic attack)  Age-related osteoporosis with current pathological fracture with routine healing, subsequent encounter  Viral upper respiratory tract infection    Assessment and Plan Assessment & Plan Hypertension   chronic, controlled Management continues with long-acting propranolol  160mg  and olmesartan  20mg . Home blood pressure readings are inconsistent, with some as high as 181/44 and 188/103, while in-office readings are acceptable at 132/82. A faulty home blood pressure cuff is suspected. Purchase a new blood pressure cuff for home monitoring. l. Monitor blood pressure at home and bring the cuff to the next appointment for calibration. Report any significant changes in blood pressure or symptoms.  Acute upper respiratory infection   She has a scratchy throat, dry cough, runny nose, and congestion, with no fever or significant respiratory distress. Symptoms have persisted for several days, with a low-grade temperature of 41F, and no signs of bacterial infection on examination. Continue Claritin for symptom relief, use Mucinex DM for cough and congestion, and take Tylenol  as needed for discomfort. Monitor symptoms and report if the condition worsens by Friday.  Chronic pleural effusion - reviewed findings w/pt  Likely secondary to an old hemorrhage from a previous pulmonary embolism in 2022. There are no new symptoms or changes in condition, and imaging shows no concerning findings. Consider Reorder imaging in six months to monitor the  pleural effusion. Report any new symptoms such as increased breathing difficulty or leg swelling.  H/o TIA-monitor.  On Eliquis  for prevention Osteoporosis w/healing fx-on prolia  q 6 mo.  Due in Feb-working on getting it approved     Return for as sch 4/27.  Jenkins CHRISTELLA Carrel, MD     [1]  Current Outpatient Medications:    acetaminophen  (TYLENOL ) 500 MG tablet, Take 500 mg by mouth every 6 (six) hours as needed for moderate pain., Disp: , Rfl:    apixaban  (ELIQUIS ) 5 MG TABS tablet, Take 1 tablet (5 mg total) by mouth 2 (two) times daily., Disp: 180 tablet, Rfl: 3   atorvastatin  (LIPITOR) 10 MG tablet, TAKE 1 TABLET BY MOUTH DAILY FOR CHOLESTEROL, Disp: 90 tablet, Rfl: 3   Calcium  Carb-Cholecalciferol  (CALCIUM  PLUS VITAMIN D  PO), Take 1 tablet by mouth daily. 1 chewable daily, Disp: , Rfl:    Cyanocobalamin  500 MCG CHEW, Chew 1 tablet by mouth daily., Disp: , Rfl:    escitalopram  (LEXAPRO ) 5 MG tablet, Take 1 tablet (5 mg total) by mouth daily., Disp: 90 tablet, Rfl: 1  hydrOXYzine  (ATARAX ) 25 MG tablet, Take 1 tablet (25 mg total) by mouth daily., Disp: 90 tablet, Rfl: 1   olmesartan  (BENICAR ) 20 MG tablet, Take 1 tablet (20 mg total) by mouth daily., Disp: 90 tablet, Rfl: 1   pramipexole  (MIRAPEX ) 0.125 MG tablet, Take 1 tablet (0.125 mg total) by mouth at bedtime., Disp: 90 tablet, Rfl: 1   propranolol  ER (INDERAL  LA) 160 MG SR capsule, Take 1 capsule (160 mg total) by mouth daily., Disp: 90 capsule, Rfl: 1  Current Facility-Administered Medications:    denosumab  (PROLIA ) injection 60 mg, 60 mg, Subcutaneous, Q6 months, Wendolyn Jenkins Jansky, MD, 60 mg at 07/15/24 1524   denosumab  (PROLIA ) injection 60 mg, 60 mg, Subcutaneous, Once, Wendolyn Jenkins Jansky, MD   denosumab  (PROLIA ) injection 60 mg, 60 mg, Subcutaneous, Q6 months, Wendolyn Jenkins Jansky, MD   denosumab  (PROLIA ) injection 60 mg, 60 mg, Subcutaneous, Q6 months, Wendolyn Jenkins Jansky, MD   denosumab  (PROLIA ) injection 60 mg, 60 mg, Subcutaneous,  Once, Kennyth Worth HERO, MD   denosumab -bbdz (JUBBONTI ) injection 60 mg, 60 mg, Subcutaneous, Once, Wendolyn Jenkins Jansky, MD [2]  Allergies Allergen Reactions   Codeine Hives   Other Other (See Comments)    Reaction: Fever (intolerance)   Cortisone     Causes jitteriness.   Dexamethasone      anxiety   Hctz [Hydrochlorothiazide ] Other (See Comments)    Low sodium   Meloxicam  Nausea Only    Gastritis and nausea   Mysoline [Primidone]    Pneumovax 23 [Pneumococcal Vac Polyvalent]     Red, raised area.  Knot at site of injection   Prednisone      Anxiety   Topiramate      Sleepiness, numbness feet tingling in arm.   Toradol  [Ketorolac  Tromethamine ] Swelling    Localized redness at injection site.     Ace Inhibitors Cough   "

## 2024-12-28 NOTE — Therapy (Incomplete)
 " OUTPATIENT PHYSICAL THERAPY NEURO TREATMENT NOTE  Patient Name: Monica Curry MRN: [id number] DOB:August 10, 1931, 89 y.o., female Today's Date: 12/28/2024   PCP: Wendolyn Jenkins Jansky, MD REFERRING PROVIDER: Danton Lauraine LABOR, PA-C       END OF SESSION:                       Past Medical History:  Diagnosis Date   Allergy    Medications and pollen   Anemia    Anxiety    Always   C. difficile colitis    Cancer [facility])    Endometrial cancer   Cataract    Had surgery 2021   Chronic kidney disease 2018   2 siblings had kidney failure, sister had transplant   Clotting disorder    Essential and other specified forms of tremor    Hyperlipidemia    Hypertension    Osteoporosis 11/2019   T score -2.8 stable from prior DEXA   Personal history of venous thrombosis and embolism 1997   Pulmonary embolism (HCC) 06/14/2021   Pulmonary embolus (HCC)    Shingles    Stroke (HCC) 2018   TIA   Thrush    TIA (transient ischemic attack)    carotid US  07/2022: no ICA stenosis bilaterally   Past Surgical History:  Procedure Laterality Date   ABDOMINAL HYSTERECTOMY  1997   APPENDECTOMY  1961   CARPAL TUNNEL RELEASE Left 10/12/2024   cataract surg Bilateral 2021   Dr. Roz, R eye 8/18, L 8/25   CESAREAN SECTION  1961   EYE SURGERY  2021   Cataract both eyes   FRACTURE SURGERY  2025   Broken hip   INTRAMEDULLARY (IM) NAIL INTERTROCHANTERIC Left 04/28/2024   Procedure: FIXATION, FRACTURE, INTERTROCHANTERIC, WITH INTRAMEDULLARY ROD;  Surgeon: Kendal Franky SQUIBB, MD;  Location: [facility] OR;  Service: Orthopedics;  Laterality: Left;   TONSILLECTOMY AND ADENOIDECTOMY  1944   TOTAL ABDOMINAL HYSTERECTOMY W/ BILATERAL SALPINGOOPHORECTOMY  1997   Patient Active Problem List   Diagnosis Date Noted   RLS (restless legs syndrome) 06/15/2024   Fracture, [place], left, closed 04/28/2024   Hip fracture (HCC) 04/27/2024   GAD (generalized anxiety disorder) 04/01/2024   Thyroid  nodule  01/10/2022   Anxiety 08/10/2021   Aortic atherosclerosis (HCC) - CT 06/2021 06/15/2021   Bilateral renal cysts 07/06/2020   Paroxysmal supraventricular tachycardia 10/20/2019   History of skin cancer (basal cell) 06/16/2019   Complete right bundle branch block 06/16/2019   B12 deficiency 06/15/2019   History of TIA (transient ischemic attack) 10/23/2017   Chronic kidney disease, stage 3b (HCC) 07/21/2015   Medication management 07/04/2014   Essential hypertension 11/25/2013   Hyperlipidemia, mixed 11/25/2013   Abnormal glucose 11/25/2013   Vitamin D  deficiency 11/25/2013   Essential tremor 11/25/2013   History of endometrial cancer    Hx/o DVT/PE    Urethral prolapse    Osteoporosis 10/10/2011    ONSET DATE: 04/28/2024  REFERRING DIAG: S/P L intertrachanteric femur fracture, Left distal radius fracture 04/28/2024   THERAPY DIAG:  No diagnosis found.  Rationale for Evaluation and Treatment: Rehabilitation  SUBJECTIVE:  SUBJECTIVE STATEMENT: Had a little soreness in L hip, but it's better.  Pt accompanied by: self  PERTINENT HISTORY: osteoporosis, hx of DVT; see additional PMH above  PAIN:  Are you having pain? No   PRECAUTIONS: Fall, wears L compression stocking due to L LE edema (was negative for DVT)  **CTR surgery LUE 10/12/24:  wearing bandage for 5 days, no lifting, to return to MD 11/18 for removal of stitches, follow up  RED FLAGS: None   WEIGHT BEARING RESTRICTIONS: No  FALLS: Has patient fallen in last 6 months? Yes. Number of falls 1  LIVING ENVIRONMENT: Lives with: lives alone Lives in: House/apartment Stairs: 3 steps to enter home;  Has following equipment at home: Single point cane and Walker - 2 wheeled *cane has tripod base and has gotten quad tip (per 10/28  visit) PLOF: Independent  PATIENT GOALS: To be able to transition to the cane, so I can get in and out of home better. 12/13/24:  To be able to maneuver better while carrying things.    OBJECTIVE:      TODAY'S TREATMENT: 12/29/24 Activity Comments                        TODAY'S TREATMENT: 12/22/2024 Activity Comments  Floor to stand transfer:  stand <>1/2 kneel<>tall kneel  UE support and min assist>min guard Cues for technique Performed 2 reps  Discussed safety with transfer technique from floor, fall prevention, pt has phone and call button   Pt requests to use restroom (approx 8 minutes in restroom) *Reports she is having upset stomach/stomach cramps and requests to end session so she can get home*                    TODAY'S TREATMENT: 12/20/2024 Activity Comments  Verbally reviewed most of HEP Return demo seated hamstring stretch and sit to stand stagger stance with red band at knees  Reminder cues  SLS activities:   -step tap/hover to 4 step, 2 x 5 reps each, then standing on [employer], step tap and hover to 6 step, 2 x 5 -Standing on solid ground-multi direction steptaps Light>intermittent UE support  Corner balance on foam:   EO and EC head turns/head nods with feet apart/together Mild/mod sway  Pt asks about floor to stand transfer, which we discussed  Checked with OT about any restrictions/precaution for L wrist before we try  Worked at counter: -Deep squats to simulate picking up objects from floor -partial lunge through RLE, with L knee supported at red therapy ball    Cues for wider BOS to avoid excess forward lean  Min guard/supervision       HOME EXERCISE PROGRAM 12/15/24 Access Code: [email or web address] URL: [email or web address] Date: 12/15/2024 Prepared by: [facility] - Outpatient  Rehab - [facility]  Exercises - Corner Balance Feet Together: Eyes Closed With Head Turns  - 1 x daily - 5 x weekly - 3 reps - 30 sec hold - Seated  Hamstring Stretch  - 1 x daily - 5 x weekly - 1 sets - 3 reps - 30 sec hold - Standing Foot Lift on Box (BKA)  - 1 x daily - 5 x weekly - 2 sets - 10 reps - Standing Hip Abduction with Ankle Weight  - 1 x daily - 2-3 x weekly - 2 sets - 10 reps - Standing Hip Extension with Ankle Weight  - 1 x daily - 2-3 x weekly - 2 sets -  10 reps - Sit to Stand with Resistance Around Legs  - 1 x daily - 2-3 x weekly - 2 sets - 10 reps - Supine Bridge with Resistance Band  - 1 x daily - 2-3 x weekly - 2 sets - 10 reps - Sidelying Hip Abduction  - 1 x daily - 2-3 x weekly - 3 sets - 10 reps  PATIENT EDUCATION: Education details: floor to stand transfer practice technique Person educated: Patient Education method: Explanation, Demonstration, Tactile cues, Verbal cues, and Handouts Education comprehension: verbalized understanding and returned demonstration    -------------------------------------------- Note: Objective measures were completed at Evaluation unless otherwise noted.  DIAGNOSTIC FINDINGS: Interval proximal left femoral cephalomedullary nail fixation of the previously seen intertrochanteric fracture. Improved alignment.  COGNITION: Overall cognitive status: Within functional limits for tasks assessed   SENSATION: Reports decreased sensation in L fingers (to see hand surgeon)  EDEMA:  Noted LLE; wearing compression stocking  POSTURE: rounded shoulders and increased thoracic kyphosis  LOWER EXTREMITY ROM:     Active  Right Eval Left Eval  Hip flexion    Hip extension    Hip abduction    Hip adduction    Hip internal rotation    Hip external rotation    Knee flexion    Knee extension    Ankle dorsiflexion  10  Ankle plantarflexion  40  Ankle inversion    Ankle eversion     (Blank rows = not tested)  LOWER EXTREMITY MMT:    MMT Right Eval Left Eval  Hip flexion 4 3+  Hip extension    Hip abduction 5 4  Hip adduction 5 4  Hip internal rotation    Hip external  rotation    Knee flexion 4+ 4+  Knee extension 4+ 4  Ankle dorsiflexion 4+ 4  Ankle plantarflexion    Ankle inversion    Ankle eversion    (Blank rows = not tested)  TRANSFERS: Sit to stand: Modified independence  Assistive device utilized: None     Stand to sit: Modified independence  Assistive device utilized: hands at chair      STAIRS: Findings: Level of Assistance: Modified independence, Stair Negotiation Technique: Step to Pattern with Single Rail on Right, Number of Stairs: 2-3, Height of Stairs: 4-6   , and Comments: prior to fracture, pt negotiated steps alternating pattern GAIT: Findings: Gait Characteristics: step through pattern, decreased step length- Right, and decreased stance time- Left, Distance walked: 50 ft x 2, Assistive device utilized:Walker - 2 wheeled and SPC with small quad tip base, Level of assistance: Modified independence, CGA, and Min A, and Comments: Mod I with RW, CGA/min assist with cane  FUNCTIONAL TESTS:  10 meter walk test: 16.78 sec RW (1.95 ft/sec); 33.62 sec with cane (0.98 ft/sec) Berg:  34/56 (Scores <45/56 indicate increased fall risk)  TREATMENT DATE: 08/30/2024    PATIENT EDUCATION: Education details: Eval results, POC, continue HEP from HHPT; verbally added lateral weightshifting to LLE, with attention to light UE support Person educated: Patient Education method: Explanation, Demonstration, and Verbal cues Education comprehension: verbalized understanding, returned demonstration, and needs further education  HOME EXERCISE PROGRAM: Verbally initiated lateral weightshifting; pt doing standing hip kicks, heel/toe raises, marching and sit to stand at home  GOALS: Goals reviewed with patient? Yes  SHORT TERM GOALS: Target date: 10/01/2024  Pt will be independent with HEP for improved gait, stair  negotiation, balance. Baseline: no concerns on HEP  09/29/24 Goal status: IN PROGRESS 09/29/24  2.  Pt will improve Berg score to at least 45/56 to decrease fall risk. Baseline: 34/56>44/56 09/27/2024 Goal status: PARTIALLY MET 09/27/2024, MET 10/27/2024  3.  Pt will negotiate curb and 3 steps with cane, mod I, for improved household and community gait. Baseline: stairs with cane with CGA  09/29/24 Goal status: MET, 10/27/2024   LONG TERM GOALS: Target date: 10/29/2024>12/10/2024>UPDATED TARGET 01/07/2025  Pt will be independent with HEP for improved balance, strength, gait, stairs. Baseline: continuing to update Goal status: IN PROGRESS, 10/27/2024  2.  Pt will improve DGI score to at least 20/24 to decrease fall risk. Baseline: 11/24>16/24>18/24 12/13/24 Goal status:IN PROGRESS, 12/13/24  3.  Pt will improve gait velocity to at least 2.6 ft/sec with cane for improved gait efficiency and safety. Baseline: 0.98 ft/sec>2.1 ft/sec with cane 10/27/2024 (2.5 ft/sec no cane 12/13/2024 Goal status: IN PROGRESS, 12/13/2024  4.  Pt will ambulate at least 500 ft, indoor and outdoor gait, mod I with cane for improved community mobility. Baseline: community distances with daughter, per report Goal status: MET, 10/27/2024  5.  Pt will improve TUG score to less than or equal to 13.5 sec without device, for decreased fall risk.  Baseline:  TUG no cane 17.13 sec 10/27/24>15.5 sec 12/13/2024  Goal status:  IN PROGRESS 12/13/24  6.  Pt will improve TUG manual score to less than or equal to 15 sec for decreased fall risk.  Baseline:  19.87 sec 10/27/24; 17 sec 12/13/24  Goal status:  IN PROGRESS  7.  Pt will improve gait velocity *with no cane* to at least 2.3 ft/sec for improved gait efficiency and safety.  Baseline:  1.82 ft/sec 10/27/24>2.51 ft/sec 12/13/2024  Goal status:  MET, 12/13/2024  8.  Pt will improve 5x sit<>stand to less than or equal to 14.5 sec to demonstrate improved functional strength and  transfer efficiency.  Baseline:  16.16 sec 10/27/24, 18.06 sec 12/13/24  Goal status:  IN PROGRESS    ASSESSMENT:  CLINICAL IMPRESSION: Pt presents today reporting some mild L hip pain that has improved since last session. Skilled PT session focused on partial floor to stand transfer (from stand<>tall kneeling) with BUE support (LUE as tolerated, per OT report) and min assist>min guard.  Discussed options for safety for getting up from floor and how to scoot to a sturdy surface to push up from.  Pt requests to go to the restroom and ultimately notes she is having stomach issues, requesting session end early so she can go home.  Pt will continue to benefit from skilled PT towards goals for improved functional mobility and decreased fall risk.  OBJECTIVE IMPAIRMENTS: Abnormal gait, decreased balance, decreased mobility, difficulty walking, and decreased strength.   ACTIVITY LIMITATIONS: standing, stairs, transfers, and locomotion level  PARTICIPATION LIMITATIONS: meal prep, cleaning, driving, shopping, community activity, yard work, and church  PERSONAL FACTORS: 3+ comorbidities: see PMH above are also affecting patient's functional outcome.   REHAB POTENTIAL: Good  CLINICAL DECISION MAKING: Evolving/moderate complexity  EVALUATION COMPLEXITY: Moderate  PLAN:  PT FREQUENCY: 2x/week  PT DURATION: 6 weeks per recert 10/27/2024  PLANNED INTERVENTIONS: 97750- Physical Performance Testing, 97110-Therapeutic exercises, 97530- Therapeutic activity, 97112- Neuromuscular re-education, 97535- Self Care, [id number]- Manual therapy, [phone]- Gait training, Patient/Family education, Balance training, Stair training, and DME instructions  PLAN FOR NEXT SESSION:  Floor to stand transfer again if pt wants to try again; SLS, glut activation in standing.  Compliant surface balance.  Gait training and balance training in clinic no device as appropriate, with carrying tasks.     Greig Anon, PT 12/28/24 8:36  AM Phone: [phone] Fax: [phone]   [facility] at [facility] [address] [place], [address] [place], [place] [address] Phone # [phone] Fax # [phone]  "

## 2024-12-29 ENCOUNTER — Ambulatory Visit

## 2024-12-29 ENCOUNTER — Encounter: Payer: Self-pay | Admitting: Family Medicine

## 2024-12-29 ENCOUNTER — Other Ambulatory Visit: Payer: Self-pay | Admitting: Family Medicine

## 2024-12-29 ENCOUNTER — Ambulatory Visit: Admitting: Physical Therapy

## 2024-12-29 MED ORDER — PRAMIPEXOLE DIHYDROCHLORIDE 0.125 MG PO TABS: 0.1250 mg | ORAL_TABLET | Freq: Every day | ORAL | 3 refills | Status: AC

## 2024-12-30 NOTE — Telephone Encounter (Signed)
 Prolia  order, waiting for Rx to arrived

## 2024-12-30 NOTE — Therapy (Incomplete)
 " OUTPATIENT PHYSICAL THERAPY NEURO TREATMENT NOTE  Patient Name: Monica Curry MRN: [id number] DOB:08-30-1931, 89 y.o., female Today's Date: 12/30/2024   PCP: Wendolyn Jenkins Jansky, MD REFERRING PROVIDER: Danton Lauraine LABOR, PA-C       END OF SESSION:                       Past Medical History:  Diagnosis Date   Allergy    Medications and pollen   Anemia    Anxiety    Always   C. difficile colitis    Cancer [facility])    Endometrial cancer   Cataract    Had surgery 2021   Chronic kidney disease 2018   2 siblings had kidney failure, sister had transplant   Clotting disorder    Essential and other specified forms of tremor    Hyperlipidemia    Hypertension    Osteoporosis 11/2019   T score -2.8 stable from prior DEXA   Personal history of venous thrombosis and embolism 1997   Pulmonary embolism (HCC) 06/14/2021   Pulmonary embolus (HCC)    Shingles    Stroke (HCC) 2018   TIA   Thrush    TIA (transient ischemic attack)    carotid US  07/2022: no ICA stenosis bilaterally   Past Surgical History:  Procedure Laterality Date   ABDOMINAL HYSTERECTOMY  1997   APPENDECTOMY  1961   CARPAL TUNNEL RELEASE Left 10/12/2024   cataract surg Bilateral 2021   Dr. Roz, R eye 8/18, L 8/25   CESAREAN SECTION  1961   EYE SURGERY  2021   Cataract both eyes   FRACTURE SURGERY  2025   Broken hip   INTRAMEDULLARY (IM) NAIL INTERTROCHANTERIC Left 04/28/2024   Procedure: FIXATION, FRACTURE, INTERTROCHANTERIC, WITH INTRAMEDULLARY ROD;  Surgeon: Kendal Franky SQUIBB, MD;  Location: [facility] OR;  Service: Orthopedics;  Laterality: Left;   TONSILLECTOMY AND ADENOIDECTOMY  1944   TOTAL ABDOMINAL HYSTERECTOMY W/ BILATERAL SALPINGOOPHORECTOMY  1997   Patient Active Problem List   Diagnosis Date Noted   RLS (restless legs syndrome) 06/15/2024   Fracture, [place], left, closed 04/28/2024   Hip fracture (HCC) 04/27/2024   GAD (generalized anxiety disorder) 04/01/2024   Thyroid  nodule  01/10/2022   Anxiety 08/10/2021   Aortic atherosclerosis (HCC) - CT 06/2021 06/15/2021   Bilateral renal cysts 07/06/2020   Paroxysmal supraventricular tachycardia 10/20/2019   History of skin cancer (basal cell) 06/16/2019   Complete right bundle branch block 06/16/2019   B12 deficiency 06/15/2019   History of TIA (transient ischemic attack) 10/23/2017   Chronic kidney disease, stage 3b (HCC) 07/21/2015   Medication management 07/04/2014   Essential hypertension 11/25/2013   Hyperlipidemia, mixed 11/25/2013   Abnormal glucose 11/25/2013   Vitamin D  deficiency 11/25/2013   Essential tremor 11/25/2013   History of endometrial cancer    Hx/o DVT/PE    Urethral prolapse    Osteoporosis 10/10/2011    ONSET DATE: 04/28/2024  REFERRING DIAG: S/P L intertrachanteric femur fracture, Left distal radius fracture 04/28/2024   THERAPY DIAG:  No diagnosis found.  Rationale for Evaluation and Treatment: Rehabilitation  SUBJECTIVE:  SUBJECTIVE STATEMENT: Had a little soreness in L hip, but it's better.  Pt accompanied by: self  PERTINENT HISTORY: osteoporosis, hx of DVT; see additional PMH above  PAIN:  Are you having pain? No   PRECAUTIONS: Fall, wears L compression stocking due to L LE edema (was negative for DVT)  **CTR surgery LUE 10/12/24:  wearing bandage for 5 days, no lifting, to return to MD 11/18 for removal of stitches, follow up  RED FLAGS: None   WEIGHT BEARING RESTRICTIONS: No  FALLS: Has patient fallen in last 6 months? Yes. Number of falls 1  LIVING ENVIRONMENT: Lives with: lives alone Lives in: House/apartment Stairs: 3 steps to enter home;  Has following equipment at home: Single point cane and Walker - 2 wheeled *cane has tripod base and has gotten quad tip (per 10/28  visit) PLOF: Independent  PATIENT GOALS: To be able to transition to the cane, so I can get in and out of home better. 12/13/24:  To be able to maneuver better while carrying things.    OBJECTIVE:      TODAY'S TREATMENT: 12/31/24 Activity Comments                        TODAY'S TREATMENT: 12/22/2024 Activity Comments  Floor to stand transfer:  stand <>1/2 kneel<>tall kneel  UE support and min assist>min guard Cues for technique Performed 2 reps  Discussed safety with transfer technique from floor, fall prevention, pt has phone and call button   Pt requests to use restroom (approx 8 minutes in restroom) *Reports she is having upset stomach/stomach cramps and requests to end session so she can get home*                    TODAY'S TREATMENT: 12/20/2024 Activity Comments  Verbally reviewed most of HEP Return demo seated hamstring stretch and sit to stand stagger stance with red band at knees  Reminder cues  SLS activities:   -step tap/hover to 4 step, 2 x 5 reps each, then standing on [employer], step tap and hover to 6 step, 2 x 5 -Standing on solid ground-multi direction steptaps Light>intermittent UE support  Corner balance on foam:   EO and EC head turns/head nods with feet apart/together Mild/mod sway  Pt asks about floor to stand transfer, which we discussed  Checked with OT about any restrictions/precaution for L wrist before we try  Worked at counter: -Deep squats to simulate picking up objects from floor -partial lunge through RLE, with L knee supported at red therapy ball    Cues for wider BOS to avoid excess forward lean  Min guard/supervision       HOME EXERCISE PROGRAM 12/15/24 Access Code: [email or web address] URL: [email or web address] Date: 12/15/2024 Prepared by: [facility] - Outpatient  Rehab - [facility]  Exercises - Corner Balance Feet Together: Eyes Closed With Head Turns  - 1 x daily - 5 x weekly - 3 reps - 30 sec hold - Seated  Hamstring Stretch  - 1 x daily - 5 x weekly - 1 sets - 3 reps - 30 sec hold - Standing Foot Lift on Box (BKA)  - 1 x daily - 5 x weekly - 2 sets - 10 reps - Standing Hip Abduction with Ankle Weight  - 1 x daily - 2-3 x weekly - 2 sets - 10 reps - Standing Hip Extension with Ankle Weight  - 1 x daily - 2-3 x weekly - 2 sets -  10 reps - Sit to Stand with Resistance Around Legs  - 1 x daily - 2-3 x weekly - 2 sets - 10 reps - Supine Bridge with Resistance Band  - 1 x daily - 2-3 x weekly - 2 sets - 10 reps - Sidelying Hip Abduction  - 1 x daily - 2-3 x weekly - 3 sets - 10 reps  PATIENT EDUCATION: Education details: floor to stand transfer practice technique Person educated: Patient Education method: Explanation, Demonstration, Tactile cues, Verbal cues, and Handouts Education comprehension: verbalized understanding and returned demonstration    -------------------------------------------- Note: Objective measures were completed at Evaluation unless otherwise noted.  DIAGNOSTIC FINDINGS: Interval proximal left femoral cephalomedullary nail fixation of the previously seen intertrochanteric fracture. Improved alignment.  COGNITION: Overall cognitive status: Within functional limits for tasks assessed   SENSATION: Reports decreased sensation in L fingers (to see hand surgeon)  EDEMA:  Noted LLE; wearing compression stocking  POSTURE: rounded shoulders and increased thoracic kyphosis  LOWER EXTREMITY ROM:     Active  Right Eval Left Eval  Hip flexion    Hip extension    Hip abduction    Hip adduction    Hip internal rotation    Hip external rotation    Knee flexion    Knee extension    Ankle dorsiflexion  10  Ankle plantarflexion  40  Ankle inversion    Ankle eversion     (Blank rows = not tested)  LOWER EXTREMITY MMT:    MMT Right Eval Left Eval  Hip flexion 4 3+  Hip extension    Hip abduction 5 4  Hip adduction 5 4  Hip internal rotation    Hip external  rotation    Knee flexion 4+ 4+  Knee extension 4+ 4  Ankle dorsiflexion 4+ 4  Ankle plantarflexion    Ankle inversion    Ankle eversion    (Blank rows = not tested)  TRANSFERS: Sit to stand: Modified independence  Assistive device utilized: None     Stand to sit: Modified independence  Assistive device utilized: hands at chair      STAIRS: Findings: Level of Assistance: Modified independence, Stair Negotiation Technique: Step to Pattern with Single Rail on Right, Number of Stairs: 2-3, Height of Stairs: 4-6   , and Comments: prior to fracture, pt negotiated steps alternating pattern GAIT: Findings: Gait Characteristics: step through pattern, decreased step length- Right, and decreased stance time- Left, Distance walked: 50 ft x 2, Assistive device utilized:Walker - 2 wheeled and SPC with small quad tip base, Level of assistance: Modified independence, CGA, and Min A, and Comments: Mod I with RW, CGA/min assist with cane  FUNCTIONAL TESTS:  10 meter walk test: 16.78 sec RW (1.95 ft/sec); 33.62 sec with cane (0.98 ft/sec) Berg:  34/56 (Scores <45/56 indicate increased fall risk)  TREATMENT DATE: 08/30/2024    PATIENT EDUCATION: Education details: Eval results, POC, continue HEP from HHPT; verbally added lateral weightshifting to LLE, with attention to light UE support Person educated: Patient Education method: Explanation, Demonstration, and Verbal cues Education comprehension: verbalized understanding, returned demonstration, and needs further education  HOME EXERCISE PROGRAM: Verbally initiated lateral weightshifting; pt doing standing hip kicks, heel/toe raises, marching and sit to stand at home  GOALS: Goals reviewed with patient? Yes  SHORT TERM GOALS: Target date: 10/01/2024  Pt will be independent with HEP for improved gait, stair  negotiation, balance. Baseline: no concerns on HEP  09/29/24 Goal status: IN PROGRESS 09/29/24  2.  Pt will improve Berg score to at least 45/56 to decrease fall risk. Baseline: 34/56>44/56 09/27/2024 Goal status: PARTIALLY MET 09/27/2024, MET 10/27/2024  3.  Pt will negotiate curb and 3 steps with cane, mod I, for improved household and community gait. Baseline: stairs with cane with CGA  09/29/24 Goal status: MET, 10/27/2024   LONG TERM GOALS: Target date: 10/29/2024>12/10/2024>UPDATED TARGET 01/07/2025  Pt will be independent with HEP for improved balance, strength, gait, stairs. Baseline: continuing to update Goal status: IN PROGRESS, 10/27/2024  2.  Pt will improve DGI score to at least 20/24 to decrease fall risk. Baseline: 11/24>16/24>18/24 12/13/24 Goal status:IN PROGRESS, 12/13/24  3.  Pt will improve gait velocity to at least 2.6 ft/sec with cane for improved gait efficiency and safety. Baseline: 0.98 ft/sec>2.1 ft/sec with cane 10/27/2024 (2.5 ft/sec no cane 12/13/2024 Goal status: IN PROGRESS, 12/13/2024  4.  Pt will ambulate at least 500 ft, indoor and outdoor gait, mod I with cane for improved community mobility. Baseline: community distances with daughter, per report Goal status: MET, 10/27/2024  5.  Pt will improve TUG score to less than or equal to 13.5 sec without device, for decreased fall risk.  Baseline:  TUG no cane 17.13 sec 10/27/24>15.5 sec 12/13/2024  Goal status:  IN PROGRESS 12/13/24  6.  Pt will improve TUG manual score to less than or equal to 15 sec for decreased fall risk.  Baseline:  19.87 sec 10/27/24; 17 sec 12/13/24  Goal status:  IN PROGRESS  7.  Pt will improve gait velocity *with no cane* to at least 2.3 ft/sec for improved gait efficiency and safety.  Baseline:  1.82 ft/sec 10/27/24>2.51 ft/sec 12/13/2024  Goal status:  MET, 12/13/2024  8.  Pt will improve 5x sit<>stand to less than or equal to 14.5 sec to demonstrate improved functional strength and  transfer efficiency.  Baseline:  16.16 sec 10/27/24, 18.06 sec 12/13/24  Goal status:  IN PROGRESS    ASSESSMENT:  CLINICAL IMPRESSION: Pt presents today reporting some mild L hip pain that has improved since last session. Skilled PT session focused on partial floor to stand transfer (from stand<>tall kneeling) with BUE support (LUE as tolerated, per OT report) and min assist>min guard.  Discussed options for safety for getting up from floor and how to scoot to a sturdy surface to push up from.  Pt requests to go to the restroom and ultimately notes she is having stomach issues, requesting session end early so she can go home.  Pt will continue to benefit from skilled PT towards goals for improved functional mobility and decreased fall risk.  OBJECTIVE IMPAIRMENTS: Abnormal gait, decreased balance, decreased mobility, difficulty walking, and decreased strength.   ACTIVITY LIMITATIONS: standing, stairs, transfers, and locomotion level  PARTICIPATION LIMITATIONS: meal prep, cleaning, driving, shopping, community activity, yard work, and church  PERSONAL FACTORS: 3+ comorbidities: see PMH above are also affecting patient's functional outcome.   REHAB POTENTIAL: Good  CLINICAL DECISION MAKING: Evolving/moderate complexity  EVALUATION COMPLEXITY: Moderate  PLAN:  PT FREQUENCY: 2x/week  PT DURATION: 6 weeks per recert 10/27/2024  PLANNED INTERVENTIONS: 97750- Physical Performance Testing, 97110-Therapeutic exercises, 97530- Therapeutic activity, 97112- Neuromuscular re-education, 97535- Self Care, [id number]- Manual therapy, [phone]- Gait training, Patient/Family education, Balance training, Stair training, and DME instructions  PLAN FOR NEXT SESSION:  Floor to stand transfer again if pt wants to try again; SLS, glut activation in standing.  Compliant surface balance.  Gait training and balance training in clinic no device as appropriate, with carrying tasks.     Greig Anon, PT 12/30/24 11:37  AM Phone: [phone] Fax: [phone]   [facility] at [facility] [address] [place], [address] [place], [place] [address] Phone # [phone] Fax # [phone]  "

## 2024-12-31 ENCOUNTER — Other Ambulatory Visit: Payer: Self-pay | Admitting: Family Medicine

## 2024-12-31 ENCOUNTER — Ambulatory Visit

## 2024-12-31 ENCOUNTER — Ambulatory Visit: Admitting: Occupational Therapy

## 2024-12-31 ENCOUNTER — Other Ambulatory Visit: Payer: Self-pay

## 2024-12-31 DIAGNOSIS — M25632 Stiffness of left wrist, not elsewhere classified: Secondary | ICD-10-CM

## 2024-12-31 DIAGNOSIS — R2681 Unsteadiness on feet: Secondary | ICD-10-CM

## 2024-12-31 DIAGNOSIS — M6281 Muscle weakness (generalized): Secondary | ICD-10-CM

## 2024-12-31 DIAGNOSIS — R208 Other disturbances of skin sensation: Secondary | ICD-10-CM

## 2024-12-31 DIAGNOSIS — R2689 Other abnormalities of gait and mobility: Secondary | ICD-10-CM

## 2024-12-31 DIAGNOSIS — R278 Other lack of coordination: Secondary | ICD-10-CM

## 2024-12-31 DIAGNOSIS — M8000XA Age-related osteoporosis with current pathological fracture, unspecified site, initial encounter for fracture: Secondary | ICD-10-CM

## 2024-12-31 NOTE — Therapy (Signed)
 " OUTPATIENT PHYSICAL THERAPY NEURO TREATMENT NOTE  Patient Name: Monica Curry MRN: [id number] DOB:Apr 16, 1931, 89 y.o., female Today's Date: 12/31/2024   PCP: Wendolyn Jenkins Jansky, MD REFERRING PROVIDER: Danton Lauraine LABOR, PA-C       END OF SESSION:  PT End of Session - 12/31/24 0923     Visit Number 27    Number of Visits 30    Date for Recertification  01/07/25    Authorization Type [employer]-reauth submitted 12/13/2024    Authorization Time Period approved 7 more PT visits from 12/13/24-01/07/25    Authorization - Visit Number 5    Authorization - Number of Visits 7    Progress Note Due on Visit 30    PT Start Time 0930    PT Stop Time 1015    PT Time Calculation (min) 45 min    Equipment Utilized During Treatment Gait belt    Activity Tolerance Patient tolerated treatment well    Behavior During Therapy [facility] for tasks assessed/performed                             Past Medical History:  Diagnosis Date   Allergy    Medications and pollen   Anemia    Anxiety    Always   C. difficile colitis    Cancer (HCC)    Endometrial cancer   Cataract    Had surgery 2021   Chronic kidney disease 2018   2 siblings had kidney failure, sister had transplant   Clotting disorder    Essential and other specified forms of tremor    Hyperlipidemia    Hypertension    Osteoporosis 11/2019   T score -2.8 stable from prior DEXA   Personal history of venous thrombosis and embolism 1997   Pulmonary embolism (HCC) 06/14/2021   Pulmonary embolus (HCC)    Shingles    Stroke (HCC) 2018   TIA   Thrush    TIA (transient ischemic attack)    carotid US  07/2022: no ICA stenosis bilaterally   Past Surgical History:  Procedure Laterality Date   ABDOMINAL HYSTERECTOMY  1997   APPENDECTOMY  1961   CARPAL TUNNEL RELEASE Left 10/12/2024   cataract surg Bilateral 2021   Dr. Roz, R eye 8/18, L 8/25   CESAREAN SECTION  1961   EYE SURGERY  2021   Cataract both eyes    FRACTURE SURGERY  2025   Broken hip   INTRAMEDULLARY (IM) NAIL INTERTROCHANTERIC Left 04/28/2024   Procedure: FIXATION, FRACTURE, INTERTROCHANTERIC, WITH INTRAMEDULLARY ROD;  Surgeon: Kendal Franky SQUIBB, MD;  Location: [facility] OR;  Service: Orthopedics;  Laterality: Left;   TONSILLECTOMY AND ADENOIDECTOMY  1944   TOTAL ABDOMINAL HYSTERECTOMY W/ BILATERAL SALPINGOOPHORECTOMY  1997   Patient Active Problem List   Diagnosis Date Noted   RLS (restless legs syndrome) 06/15/2024   Fracture, [place], left, closed 04/28/2024   Hip fracture (HCC) 04/27/2024   GAD (generalized anxiety disorder) 04/01/2024   Thyroid  nodule 01/10/2022   Anxiety 08/10/2021   Aortic atherosclerosis (HCC) - CT 06/2021 06/15/2021   Bilateral renal cysts 07/06/2020   Paroxysmal supraventricular tachycardia 10/20/2019   History of skin cancer (basal cell) 06/16/2019   Complete right bundle branch block 06/16/2019   B12 deficiency 06/15/2019   History of TIA (transient ischemic attack) 10/23/2017   Chronic kidney disease, stage 3b (HCC) 07/21/2015   Medication management 07/04/2014   Essential hypertension 11/25/2013   Hyperlipidemia, mixed 11/25/2013  Abnormal glucose 11/25/2013   Vitamin D  deficiency 11/25/2013   Essential tremor 11/25/2013   History of endometrial cancer    Hx/o DVT/PE    Urethral prolapse    Osteoporosis 10/10/2011    ONSET DATE: 04/28/2024  REFERRING DIAG: S/P L intertrachanteric femur fracture, Left distal radius fracture 04/28/2024   THERAPY DIAG:  Muscle weakness (generalized)  Other abnormalities of gait and mobility  Unsteadiness on feet  Rationale for Evaluation and Treatment: Rehabilitation  SUBJECTIVE:                                                                                                                                                                                             SUBJECTIVE STATEMENT: Was having some discomfort to lateral hip and backed off some of the  harder ones and that seems to have helped  Pt accompanied by: self  PERTINENT HISTORY: osteoporosis, hx of DVT; see additional PMH above  PAIN:  Are you having pain? No   PRECAUTIONS: Fall, wears L compression stocking due to L LE edema (was negative for DVT)  **CTR surgery LUE 10/12/24:  wearing bandage for 5 days, no lifting, to return to MD 11/18 for removal of stitches, follow up  RED FLAGS: None   WEIGHT BEARING RESTRICTIONS: No  FALLS: Has patient fallen in last 6 months? Yes. Number of falls 1  LIVING ENVIRONMENT: Lives with: lives alone Lives in: House/apartment Stairs: 3 steps to enter home;  Has following equipment at home: Single point cane and Walker - 2 wheeled *cane has tripod base and has gotten quad tip (per 10/28 visit) PLOF: Independent  PATIENT GOALS: To be able to transition to the cane, so I can get in and out of home better. 12/13/24:  To be able to maneuver better while carrying things.    OBJECTIVE:   TODAY'S TREATMENT: 12/31/24 Activity Comments  Floor to stand trials -use of dining room chairs for pushing lunge to stand  Hip add sidestep 3x10 10# cable  Seated hamstring curls 3x10 15#  Resisted walking 15#   Cone slide and step CGA  Balance on foam Alternating between feet together eyes closed x 30 sec and then alternating cone taps x 10 x 3 rounds      TODAY'S TREATMENT: 12/22/2024 Activity Comments  Floor to stand transfer:  stand <>1/2 kneel<>tall kneel  UE support and min assist>min guard Cues for technique Performed 2 reps  Discussed safety with transfer technique from floor, fall prevention, pt has phone and call button   Pt requests to use restroom (approx 8 minutes in restroom) *Reports she is having upset stomach/stomach cramps  and requests to end session so she can get home*                    TODAY'S TREATMENT: 12/20/2024 Activity Comments  Verbally reviewed most of HEP Return demo seated hamstring stretch and sit to stand  stagger stance with red band at knees  Reminder cues  SLS activities:   -step tap/hover to 4 step, 2 x 5 reps each, then standing on [employer]ire[employer], step tap and hover to 6 step, 2 x 5 -Standing on solid ground-multi direction steptaps Light>intermittent UE support  Corner balance on foam:   EO and EC head turns/head nods with feet apart/together Mild/mod sway  Pt asks about floor to stand transfer, which we discussed  Checked with OT about any restrictions/precaution for L wrist before we try  Worked at counter: -Deep squats to simulate picking up objects from floor -partial lunge through RLE, with L knee supported at red therapy ball    Cues for wider BOS to avoid excess forward lean  Min guard/supervision       HOME EXERCISE PROGRAM 12/15/24 Access Code: [email or web address] URL: [email or web address] Date: 12/15/2024 Prepared by: [facility] - Outpatient  Rehab - [facility]  Exercises - Corner Balance Feet Together: Eyes Closed With Head Turns  - 1 x daily - 5 x weekly - 3 reps - 30 sec hold - Seated Hamstring Stretch  - 1 x daily - 5 x weekly - 1 sets - 3 reps - 30 sec hold - Standing Foot Lift on Box (BKA)  - 1 x daily - 5 x weekly - 2 sets - 10 reps - Standing Hip Abduction with Ankle Weight  - 1 x daily - 2-3 x weekly - 2 sets - 10 reps - Standing Hip Extension with Ankle Weight  - 1 x daily - 2-3 x weekly - 2 sets - 10 reps - Sit to Stand with Resistance Around Legs  - 1 x daily - 2-3 x weekly - 2 sets - 10 reps - Supine Bridge with Resistance Band  - 1 x daily - 2-3 x weekly - 2 sets - 10 reps - Sidelying Hip Abduction  - 1 x daily - 2-3 x weekly - 3 sets - 10 reps  PATIENT EDUCATION: Education details: floor to stand transfer practice technique Person educated: Patient Education method: Explanation, Demonstration, Tactile cues, Verbal cues, and Handouts Education comprehension: verbalized understanding and returned  demonstration    -------------------------------------------- Note: Objective measures were completed at Evaluation unless otherwise noted.  DIAGNOSTIC FINDINGS: Interval proximal left femoral cephalomedullary nail fixation of the previously seen intertrochanteric fracture. Improved alignment.  COGNITION: Overall cognitive status: Within functional limits for tasks assessed   SENSATION: Reports decreased sensation in L fingers (to see hand surgeon)  EDEMA:  Noted LLE; wearing compression stocking  POSTURE: rounded shoulders and increased thoracic kyphosis  LOWER EXTREMITY ROM:     Active  Right Eval Left Eval  Hip flexion    Hip extension    Hip abduction    Hip adduction    Hip internal rotation    Hip external rotation    Knee flexion    Knee extension    Ankle dorsiflexion  10  Ankle plantarflexion  40  Ankle inversion    Ankle eversion     (Blank rows = not tested)  LOWER EXTREMITY MMT:    MMT Right Eval Left Eval  Hip flexion 4 3+  Hip extension    Hip abduction  5 4  Hip adduction 5 4  Hip internal rotation    Hip external rotation    Knee flexion 4+ 4+  Knee extension 4+ 4  Ankle dorsiflexion 4+ 4  Ankle plantarflexion    Ankle inversion    Ankle eversion    (Blank rows = not tested)  TRANSFERS: Sit to stand: Modified independence  Assistive device utilized: None     Stand to sit: Modified independence  Assistive device utilized: hands at chair      STAIRS: Findings: Level of Assistance: Modified independence, Stair Negotiation Technique: Step to Pattern with Single Rail on Right, Number of Stairs: 2-3, Height of Stairs: 4-6   , and Comments: prior to fracture, pt negotiated steps alternating pattern GAIT: Findings: Gait Characteristics: step through pattern, decreased step length- Right, and decreased stance time- Left, Distance walked: 50 ft x 2, Assistive device utilized:Walker - 2 wheeled and SPC with small quad tip base, Level of  assistance: Modified independence, CGA, and Min A, and Comments: Mod I with RW, CGA/min assist with cane  FUNCTIONAL TESTS:  10 meter walk test: 16.78 sec RW (1.95 ft/sec); 33.62 sec with cane (0.98 ft/sec) Berg:  34/56 (Scores <45/56 indicate increased fall risk)                                                                                                                                  TREATMENT DATE: 08/30/2024    PATIENT EDUCATION: Education details: Eval results, POC, continue HEP from HHPT; verbally added lateral weightshifting to LLE, with attention to light UE support Person educated: Patient Education method: Explanation, Demonstration, and Verbal cues Education comprehension: verbalized understanding, returned demonstration, and needs further education  HOME EXERCISE PROGRAM: Verbally initiated lateral weightshifting; pt doing standing hip kicks, heel/toe raises, marching and sit to stand at home  GOALS: Goals reviewed with patient? Yes  SHORT TERM GOALS: Target date: 10/01/2024  Pt will be independent with HEP for improved gait, stair negotiation, balance. Baseline: no concerns on HEP  09/29/24 Goal status: IN PROGRESS 09/29/24  2.  Pt will improve Berg score to at least 45/56 to decrease fall risk. Baseline: 34/56>44/56 09/27/2024 Goal status: PARTIALLY MET 09/27/2024, MET 10/27/2024  3.  Pt will negotiate curb and 3 steps with cane, mod I, for improved household and community gait. Baseline: stairs with cane with CGA  09/29/24 Goal status: MET, 10/27/2024   LONG TERM GOALS: Target date: 10/29/2024>12/10/2024>UPDATED TARGET 01/07/2025  Pt will be independent with HEP for improved balance, strength, gait, stairs. Baseline: continuing to update Goal status: IN PROGRESS, 10/27/2024  2.  Pt will improve DGI score to at least 20/24 to decrease fall risk. Baseline: 11/24>16/24>18/24 12/13/24 Goal status:IN PROGRESS, 12/13/24  3.  Pt will improve gait velocity to  at least 2.6 ft/sec with cane for improved gait efficiency and safety. Baseline: 0.98 ft/sec>2.1 ft/sec with cane 10/27/2024 (2.5 ft/sec no cane 12/13/2024 Goal status: IN PROGRESS,  12/13/2024  4.  Pt will ambulate at least 500 ft, indoor and outdoor gait, mod I with cane for improved community mobility. Baseline: community distances with daughter, per report Goal status: MET, 10/27/2024  5.  Pt will improve TUG score to less than or equal to 13.5 sec without device, for decreased fall risk.  Baseline:  TUG no cane 17.13 sec 10/27/24>15.5 sec 12/13/2024  Goal status:  IN PROGRESS 12/13/24  6.  Pt will improve TUG manual score to less than or equal to 15 sec for decreased fall risk.  Baseline:  19.87 sec 10/27/24; 17 sec 12/13/24  Goal status:  IN PROGRESS  7.  Pt will improve gait velocity *with no cane* to at least 2.3 ft/sec for improved gait efficiency and safety.  Baseline:  1.82 ft/sec 10/27/24>2.51 ft/sec 12/13/2024  Goal status:  MET, 12/13/2024  8.  Pt will improve 5x sit<>stand to less than or equal to 14.5 sec to demonstrate improved functional strength and transfer efficiency.  Baseline:  16.16 sec 10/27/24, 18.06 sec 12/13/24  Goal status:  IN PROGRESS    ASSESSMENT:  CLINICAL IMPRESSION: Instruction in floor to stand transfers with demo using dining room chair to enable UE support for kneel to lunge and demo good performance modified independent.  Continued with dynamic strengthening activities to improve LLE single limb support with emphasis on to hip abd/add. Balance activities to improve LLE stance control and safety with things such as stepping up or over obstacles all performed without use of AD.  Occasionally CGA or UE support needed during LLE single limb stance but minimal hip drop appreciated.  Discussed initiating D/C planning at this time and pt amenable OBJECTIVE IMPAIRMENTS: Abnormal gait, decreased balance, decreased mobility, difficulty walking, and decreased strength.    ACTIVITY LIMITATIONS: standing, stairs, transfers, and locomotion level  PARTICIPATION LIMITATIONS: meal prep, cleaning, driving, shopping, community activity, yard work, and church  PERSONAL FACTORS: 3+ comorbidities: see PMH above are also affecting patient's functional outcome.   REHAB POTENTIAL: Good  CLINICAL DECISION MAKING: Evolving/moderate complexity  EVALUATION COMPLEXITY: Moderate  PLAN:  PT FREQUENCY: 2x/week  PT DURATION: 6 weeks per recert 10/27/2024  PLANNED INTERVENTIONS: 97750- Physical Performance Testing, 97110-Therapeutic exercises, 97530- Therapeutic activity, 97112- Neuromuscular re-education, 97535- Self Care, [id number]- Manual therapy, [phone]- Gait training, Patient/Family education, Balance training, Stair training, and DME instructions  PLAN FOR NEXT SESSION:  Goals check, HEP refinement/advancement, and ready for D/C   10:41 AM, 12/31/24 M. Kelly Emilianna Barlowe, PT, DPT Physical Therapist- [place] Office Number: [phone]   "

## 2024-12-31 NOTE — Therapy (Signed)
 " OUTPATIENT OCCUPATIONAL THERAPY ORTHO Treatment Note  Patient Name: Monica Curry MRN: [id number] DOB:15-May-1931, 89 y.o., female Today's Date: 12/31/2024  PCP: Wendolyn Jenkins Jansky, MD  REFERRING PROVIDER: Arlinda Buster, MD  END OF SESSION:  OT End of Session - 12/31/24 0946     Visit Number 13    Number of Visits 21    Date for Recertification  01/21/25    Authorization Type [employer]    Authorization Time Period Auth#: [id number], approved 10 more OT visits from 12/17/24-03/17/25    Authorization - Visit Number 4    Authorization - Number of Visits 10    OT Start Time 0850    OT Stop Time 0930    OT Time Calculation (min) 40 min    Activity Tolerance Patient tolerated treatment well    Behavior During Therapy [facility] for tasks assessed/performed                     Past Medical History:  Diagnosis Date   Allergy    Medications and pollen   Anemia    Anxiety    Always   C. difficile colitis    Cancer (HCC)    Endometrial cancer   Cataract    Had surgery 2021   Chronic kidney disease 2018   2 siblings had kidney failure, sister had transplant   Clotting disorder    Essential and other specified forms of tremor    Hyperlipidemia    Hypertension    Osteoporosis 11/2019   T score -2.8 stable from prior DEXA   Personal history of venous thrombosis and embolism 1997   Pulmonary embolism (HCC) 06/14/2021   Pulmonary embolus (HCC)    Shingles    Stroke (HCC) 2018   TIA   Thrush    TIA (transient ischemic attack)    carotid US  07/2022: no ICA stenosis bilaterally   Past Surgical History:  Procedure Laterality Date   ABDOMINAL HYSTERECTOMY  1997   APPENDECTOMY  1961   CARPAL TUNNEL RELEASE Left 10/12/2024   cataract surg Bilateral 2021   Dr. Roz, R eye 8/18, L 8/25   CESAREAN SECTION  1961   EYE SURGERY  2021   Cataract both eyes   FRACTURE SURGERY  2025   Broken hip   INTRAMEDULLARY (IM) NAIL INTERTROCHANTERIC Left 04/28/2024    Procedure: FIXATION, FRACTURE, INTERTROCHANTERIC, WITH INTRAMEDULLARY ROD;  Surgeon: Kendal Franky SQUIBB, MD;  Location: [facility] OR;  Service: Orthopedics;  Laterality: Left;   TONSILLECTOMY AND ADENOIDECTOMY  1944   TOTAL ABDOMINAL HYSTERECTOMY W/ BILATERAL SALPINGOOPHORECTOMY  1997   Patient Active Problem List   Diagnosis Date Noted   RLS (restless legs syndrome) 06/15/2024   Fracture, Col[place], left, closed 04/28/2024   Hip fracture (HCC) 04/27/2024   GAD (generalized anxiety disorder) 04/01/2024   Thyroid  nodule 01/10/2022   Anxiety 08/10/2021   Aortic atherosclerosis (HCC) - CT 06/2021 06/15/2021   Bilateral renal cysts 07/06/2020   Paroxysmal supraventricular tachycardia 10/20/2019   History of skin cancer (basal cell) 06/16/2019   Complete right bundle branch block 06/16/2019   B12 deficiency 06/15/2019   History of TIA (transient ischemic attack) 10/23/2017   Chronic kidney disease, stage 3b (HCC) 07/21/2015   Medication management 07/04/2014   Essential hypertension 11/25/2013   Hyperlipidemia, mixed 11/25/2013   Abnormal glucose 11/25/2013   Vitamin D  deficiency 11/25/2013   Essential tremor 11/25/2013   History of endometrial cancer    Hx/o DVT/PE    Urethral prolapse  Osteoporosis 10/10/2011    ONSET DATE: referral date 10/07/24 (injury 04/27/24 and [facility] sx 10/12/24)  REFERRING DIAG: G56.02 (ICD-10-CM) - Carpal tunnel syndrome, left upper limb  THERAPY DIAG:  Muscle weakness (generalized)  Stiffness of left wrist, not elsewhere classified  Other lack of coordination  Other disturbances of skin sensation  Rationale for Evaluation and Treatment: Rehabilitation  SUBJECTIVE:   SUBJECTIVE STATEMENT: Pt reports that she had been dealing with stomach issues and then congestion/cold.  I'm wondering if my fingers are more numb or if it's because they are more straight/open.  Pt accompanied by: self  PERTINENT HISTORY:   Per MD note 10/26/24: 89 y.o. female who  presents today for follow up 2 weeks status post left wrist open carpal tunnel release.  Doing well overall, pain controlled, does have persistent numbness and tingling.  Left hand: - Well-healing palmar incision, sutures removed, skin edges well-approximated without erythema or drainage - Composite fist limited secondary to stiffness, improved passively - Sensation remains diminished to light touch in the median nerve distribution - 4/5 APB mild thenar atrophy, thumb opposition to ring finger PIP  PRECAUTIONS: Fall  WEIGHT BEARING RESTRICTIONS: No  PAIN:  Are you having pain? Yes: NPRS scale: minimal Pain location: palm of hand Pain description: twinge Aggravating factors: touching hand around incision Relieving factors: being still  FALLS: Has patient fallen in last 6 months? Yes. Number of falls 1 - fall in May that resulted in hip and wrist fractures.  LIVING ENVIRONMENT: Lives with: lives alone Lives in: House/apartment Stairs: 3 steps to enter, flight of steps to the basement but she does not go down them Has following equipment at home: [name] - 2 wheeled, single point cane, shower chair, and Grab bars  PLOF: Independent with basic ADLs and Requires assistive device for independence; step-daughter is currently doing laundry at her home as pt's washer/dryer are in the basement  PATIENT GOALS: to be able to use Left hand again   NEXT MD VISIT: 11/23/24  OBJECTIVE:  Note: Objective measures were completed at Evaluation unless otherwise noted.  HAND DOMINANCE: Right  ADLs: WFL; difficulty with donning compression sock on L leg but is able to don  FUNCTIONAL OUTCOME MEASURES: Quick Dash: 50%   12/07/24   UPPER EXTREMITY ROM:     Active ROM Right eval Left eval Left 11/22/24 Left 12/07/24  Shoulder flexion      Shoulder abduction      Shoulder adduction      Shoulder extension      Shoulder internal rotation      Shoulder external rotation      Elbow  flexion      Elbow extension      Wrist flexion 75 34 30 38  Wrist extension 60 17 40 36  Wrist ulnar deviation 30 18 22 24   Wrist radial deviation 12 10 8 12   Wrist pronation  WFL    Wrist supination  80%  100%  (Blank rows = not tested)  Loose grasp on L, however all fingers are able to touch palm of hand lightly in open fist, decreased full fist, hook fist, and table top  HAND FUNCTION: Not assessed due to stitches just removed yesterday.  On eval prior to surgery 08/30/24: Right: 40 lbs; Left: 5 lbs  12/07/24: Grip strength:  COORDINATION: 9 Hole Peg test: Right: 30.13 sec; Left: 61.75 sec; Mild B tremors  11/22/24: left: 39.56 sec  12/07/24 Left: 37.21 sec; min-moderate tremors this session  SENSATION:  Numbness and tingling in L hand and digits along median nerve distribution  EDEMA: NA  COGNITION: Overall cognitive status: Within functional limits for tasks assessed   OBSERVATIONS: Pt L wrist at ulnar head and dorsal wrist enlarged compared to R wrist and hand due to bone deformity at dorsal wrist and ulnar styloid s/p setting of fracture back in May 2025. Pt demonstrating ~50% movement in all directions when compared to R, but verbalizing good understanding of exercises.    TREATMENT DATE:  12/31/24 Heat: applied moist heat to L wrist prior to engaging in wrist ROM and strengthening. Pt reports hyper-sensitivity in long finger and feeling that it is worse than the others and maybe worse than before. Engaged in discussion of typical sensory recovery s/p carpal tunnel release. Pt with skin intact pre and post heat.   Wrist extension: completed prayer stretch x5 for 10-15 seconds.  Engaged in ball rolling in standing for increased WB and wrist extension x15.  Pt with no reports of pain or changes in sensation with increased demand. Hand gripper: Engaged in full grasp on blue (light) digiflex x10 with focus on sustained hold 2-3 seconds for increased grip  strengthening. Coordination/ROM: engaged in flipping cards with L hand to match by colors with focus on forearm supination and wrist extension.     12/20/24 ROM: engaged in ball exercises with L hand, rotating ball clockwise and counter-clockwise in hand x5-10 each direction x2.  Transitioned to squeezing ball and holding ~20 seconds at a time for grip strength. BUE strengthening: engaged in counter top pushups x10 with focus on UB strengthening and increasing symmetry.  Pt still biasing weight shift and increased effort through RUE due to decreased L wrist extension.  Modified to BUE on wall to decrease wrist extension while still allowing for UB strengthening with pt tolerating modified position. Strengthening: engaged in supination/pronation, elbow flexion, overhead extension, and tricep extension with 1# dumbbell.  OT providing demonstration and rationale to carryover of UE strengthening as needed for ADLs and IADLs.     12/17/24 Heat: applied moist heat to L wrist prior to engaging in wrist ROM and strengthening.  Pt with skin intact pre and post heat.   AAROM: engaged L wrist flexion/extension with OT providing overpressure to facilitate increased stretch and ROM. Wrist ROM: flexion/extension with weighted ball with cues to sustain hold 2-3 seconds at end range.  OT providing demonstration and min cues for technique. Utilized flex bar to attempt ulnar/radial deviation.  OT providing demonstration and cues, pt demonstrating min difficulty with technique.  OT educating on norms and comparing previous measurements to RUE ROM.  Engaged in circumduction with 2# dumbbell in clockwise and counter-clockwise directions followed by flexion/extension with 2# dumbbell.  PATIENT EDUCATION: Education details: heat, ROM, strengthening Person educated: Patient Education method: Explanation, Demonstration, Verbal cues, and Handouts Education comprehension: verbalized understanding and needs further  education  HOME EXERCISE PROGRAM: Carpal tunnel release HEP (see pt instructions)  Access Code: [email or web address] URL: [email or web address] Date: 12/20/2024 Prepared by: [facility] - [facility]  Exercises - Putty Squeezes  - 2 x daily - 15 reps - 3-Point Pinch with Putty  - 2 x daily - 15 reps - Finger Lumbricals with Putty  - 2 x daily - 15 reps - Seated Wrist Extension Stretch  - 2 x daily - 5-10 reps - Seated Wrist Flexion Stretch  - 2 x daily - 5-10 reps - Seated Wrist Prayer Stretch  - 2 x daily -  10 reps - ~20 sec hold - Push-Up on Counter  - 2 x daily - 10 reps - Seated Forearm Pronation and Supination with Hammer  - 2 x daily - 10 reps - Seated Single Arm Bicep Curls with Rotation and Dumbbell  - 2 x daily - 10 reps - Seated Overhead Press  - 2 x daily - 10 reps - Seated Overhead Elbow Extension  - 2 x daily - 10 reps  GOALS: Goals reviewed with patient? Yes   SHORT TERM GOALS: Target date: 12/31/24  Pt will be independent with HEP for ROM and strengthening. Baseline: new to OPOT Goal status: in progress   2.  Pt will verbalize understanding of task modifications and/or potential A/E needs to increase ease, safety, and independence w/ ADLs. Baseline: difficulty with donning compression socks d/t decreased grip and mobility of L wrist/hand Goal status: in progress  3.  Pt will verbalize understanding of re-sensitization strategies as well as compensatory strategies due to impaired sensation.  Baseline: impaired sensation in hand at median nerve distribution  Goal status: in progress    LONG TERM GOALS: Target date: 01/21/25  1.  Pt will improve grip strength in left hand to be within at least 20 pounds of her right hand for functional use at home and in IADLs.  Baseline: L: 5# and R: 40# 12/07/24: L: 7.5# Goal status: in progress   2.  Pt will demonstrate improved L wrist flexion/extension to 75% of R wrist flexion/extension for  improved functional use.  Baseline: R:75/60 and L: 34/17 12/07/24: L: 38/36 Goal status: in progress   3.  Pt will demonstrate improvements in pain and functional use of LUE as evidenced by decreased score on QuickDASH from 50% impairement to < 30 % impairment.  Baseline: 50% 12/07/24: 40.9% Goal status: in progress   ASSESSMENT:  CLINICAL IMPRESSION: Patient is a 89 y.o. female who was seen today for occupational therapy treatment s/p L carpal tunnel release. Pt demonstrating tolerance of WB through LUE in standing while pushing through ball on table top for increased WB and wrist extension.  Pt receptive to recovery process and continued engagement in ROM exercises and re-sensitization activities. Pt will continue to benefit from skilled OT services to address ROM, sensation, strengthening, and improved functional use of LUE.  PERFORMANCE DEFICITS: in functional skills including ADLs, IADLs, coordination, sensation, edema, ROM, strength, flexibility, Fine motor control, Gross motor control, body mechanics, endurance, decreased knowledge of precautions, and UE functional use and psychosocial skills including routines and behaviors.      PLAN:  OT FREQUENCY: 1-2x/week  OT DURATION: 6 weeks  PLANNED INTERVENTIONS: 97168 OT Re-evaluation, 97535 self care/ADL training, 02889 therapeutic exercise, 97530 therapeutic activity, 97112 neuromuscular re-education, 97140 manual therapy, 97035 ultrasound, 97018 paraffin, 02239 Orthotic Initial, 97763 Orthotic/Prosthetic subsequent, passive range of motion, compression bandaging, coping strategies training, patient/family education, and DME and/or AE instructions   RECOMMENDED OTHER SERVICES: NA  CONSULTED AND AGREED WITH PLAN OF CARE: Patient  PLAN FOR NEXT SESSION: Continue use of modalities for ROM and pain relief, after 4-6 weeks s/p sx can start light strengthening, progress STGs/LTGs  Review UB strengthening, may even introduce theraband  HEP if appropriate     KAYLENE DOMINO, OTR/L 12/31/2024, 9:47 AM  [facility] at [facility] [address], [address] [place], [place] [address] Phone # [phone] Fax # [phone]  "

## 2025-01-03 ENCOUNTER — Other Ambulatory Visit: Payer: Self-pay

## 2025-01-03 ENCOUNTER — Other Ambulatory Visit: Payer: Self-pay | Admitting: Pharmacy Technician

## 2025-01-03 ENCOUNTER — Ambulatory Visit: Admitting: Occupational Therapy

## 2025-01-03 ENCOUNTER — Ambulatory Visit

## 2025-01-03 MED ORDER — PROLIA 60 MG/ML ~~LOC~~ SOSY
60.0000 mg | PREFILLED_SYRINGE | Freq: Once | SUBCUTANEOUS | 0 refills | Status: AC
  Filled 2025-01-03: qty 1, 180d supply, fill #0
  Filled 2025-01-03: qty 1, 1d supply, fill #0

## 2025-01-03 NOTE — Progress Notes (Signed)
 Specialty Pharmacy Refill Coordination Note  Monica Curry is a 89 y.o. female assessed today regarding refills of clinic administered specialty medication(s) Denosumab  (Prolia )   Clinic requested Courier to Provider Office   Delivery date: 01/12/25   Verified address: [facility] [place] [phone]   Medication will be filled on: 01/11/25

## 2025-01-04 NOTE — Therapy (Incomplete)
 " OUTPATIENT PHYSICAL THERAPY NEURO TREATMENT NOTE  Patient Name: Monica Curry MRN: [id number] DOB:[birth year], 89 y.o., female Today's Date: 01/04/2025   PCP: Wendolyn Jenkins Jansky, MD REFERRING PROVIDER: Danton Lauraine LABOR, PA-C       END OF SESSION:                       Past Medical History:  Diagnosis Date   Allergy    Medications and pollen   Anemia    Anxiety    Always   C. difficile colitis    Cancer [facility])    Endometrial cancer   Cataract    Had surgery 2021   Chronic kidney disease 2018   2 siblings had kidney failure, sister had transplant   Clotting disorder    Essential and other specified forms of tremor    Hyperlipidemia    Hypertension    Osteoporosis 11/2019   T score -2.8 stable from prior DEXA   Personal history of venous thrombosis and embolism 1997   Pulmonary embolism (HCC) 06/14/2021   Pulmonary embolus (HCC)    Shingles    Stroke (HCC) 2018   TIA   Thrush    TIA (transient ischemic attack)    carotid US  07/2022: no ICA stenosis bilaterally   Past Surgical History:  Procedure Laterality Date   ABDOMINAL HYSTERECTOMY  1997   APPENDECTOMY  1961   CARPAL TUNNEL RELEASE Left 10/12/2024   cataract surg Bilateral 2021   Dr. Roz, R eye 8/18, L 8/25   CESAREAN SECTION  1961   EYE SURGERY  2021   Cataract both eyes   FRACTURE SURGERY  2025   Broken hip   INTRAMEDULLARY (IM) NAIL INTERTROCHANTERIC Left 04/28/2024   Procedure: FIXATION, FRACTURE, INTERTROCHANTERIC, WITH INTRAMEDULLARY ROD;  Surgeon: Kendal Franky SQUIBB, MD;  Location: [facility] OR;  Service: Orthopedics;  Laterality: Left;   TONSILLECTOMY AND ADENOIDECTOMY  1944   TOTAL ABDOMINAL HYSTERECTOMY W/ BILATERAL SALPINGOOPHORECTOMY  1997   Patient Active Problem List   Diagnosis Date Noted   RLS (restless legs syndrome) 06/15/2024   Fracture, [place], left, closed 04/28/2024   Hip fracture (HCC) 04/27/2024   GAD (generalized anxiety disorder) 04/01/2024   Thyroid  nodule  01/10/2022   Anxiety 08/10/2021   Aortic atherosclerosis (HCC) - CT 06/2021 06/15/2021   Bilateral renal cysts 07/06/2020   Paroxysmal supraventricular tachycardia 10/20/2019   History of skin cancer (basal cell) 06/16/2019   Complete right bundle branch block 06/16/2019   B12 deficiency 06/15/2019   History of TIA (transient ischemic attack) 10/23/2017   Chronic kidney disease, stage 3b (HCC) 07/21/2015   Medication management 07/04/2014   Essential hypertension 11/25/2013   Hyperlipidemia, mixed 11/25/2013   Abnormal glucose 11/25/2013   Vitamin D  deficiency 11/25/2013   Essential tremor 11/25/2013   History of endometrial cancer    Hx/o DVT/PE    Urethral prolapse    Osteoporosis 10/10/2011    ONSET DATE: 04/28/2024  REFERRING DIAG: S/P L intertrachanteric femur fracture, Left distal radius fracture 04/28/2024   THERAPY DIAG:  No diagnosis found.  Rationale for Evaluation and Treatment: Rehabilitation  SUBJECTIVE:  SUBJECTIVE STATEMENT: Was having some discomfort to lateral hip and backed off some of the harder ones and that seems to have helped  Pt accompanied by: self  PERTINENT HISTORY: osteoporosis, hx of DVT; see additional PMH above  PAIN:  Are you having pain? No   PRECAUTIONS: Fall, wears L compression stocking due to L LE edema (was negative for DVT)  **CTR surgery LUE 10/12/24:  wearing bandage for 5 days, no lifting, to return to MD 11/18 for removal of stitches, follow up  RED FLAGS: None   WEIGHT BEARING RESTRICTIONS: No  FALLS: Has patient fallen in last 6 months? Yes. Number of falls 1  LIVING ENVIRONMENT: Lives with: lives alone Lives in: House/apartment Stairs: 3 steps to enter home;  Has following equipment at home: Single point cane and Walker - 2  wheeled *cane has tripod base and has gotten quad tip (per 10/28 visit) PLOF: Independent  PATIENT GOALS: To be able to transition to the cane, so I can get in and out of home better. 12/13/24:  To be able to maneuver better while carrying things.    OBJECTIVE:     TODAY'S TREATMENT: 01/05/25 Activity Comments                       TODAY'S TREATMENT: 12/31/24 Activity Comments  Floor to stand trials -use of dining room chairs for pushing lunge to stand  Hip add sidestep 3x10 10# cable  Seated hamstring curls 3x10 15#  Resisted walking 15#   Cone slide and step CGA  Balance on foam Alternating between feet together eyes closed x 30 sec and then alternating cone taps x 10 x 3 rounds      TODAY'S TREATMENT: 12/22/2024 Activity Comments  Floor to stand transfer:  stand <>1/2 kneel<>tall kneel  UE support and min assist>min guard Cues for technique Performed 2 reps  Discussed safety with transfer technique from floor, fall prevention, pt has phone and call button   Pt requests to use restroom (approx 8 minutes in restroom) *Reports she is having upset stomach/stomach cramps and requests to end session so she can get home*                    TODAY'S TREATMENT: 12/20/2024 Activity Comments  Verbally reviewed most of HEP Return demo seated hamstring stretch and sit to stand stagger stance with red band at knees  Reminder cues  SLS activities:   -step tap/hover to 4 step, 2 x 5 reps each, then standing on [employer]ire[employer], step tap and hover to 6 step, 2 x 5 -Standing on solid ground-multi direction steptaps Light>intermittent UE support  Corner balance on foam:   EO and EC head turns/head nods with feet apart/together Mild/mod sway  Pt asks about floor to stand transfer, which we discussed  Checked with OT about any restrictions/precaution for L wrist before we try  Worked at counter: -Deep squats to simulate picking up objects from floor -partial lunge through RLE, with L knee  supported at red therapy ball    Cues for wider BOS to avoid excess forward lean  Min guard/supervision       HOME EXERCISE PROGRAM 12/15/24 Access Code: [email or web address] URL: [email or web address] Date: 12/15/2024 Prepared by: [facility] - [facility]  Exercises - Corner Balance Feet Together: Eyes Closed With Head Turns  - 1 x daily - 5 x weekly - 3 reps - 30 sec hold - Seated Hamstring Stretch  -  1 x daily - 5 x weekly - 1 sets - 3 reps - 30 sec hold - Standing Foot Lift on Box (BKA)  - 1 x daily - 5 x weekly - 2 sets - 10 reps - Standing Hip Abduction with Ankle Weight  - 1 x daily - 2-3 x weekly - 2 sets - 10 reps - Standing Hip Extension with Ankle Weight  - 1 x daily - 2-3 x weekly - 2 sets - 10 reps - Sit to Stand with Resistance Around Legs  - 1 x daily - 2-3 x weekly - 2 sets - 10 reps - Supine Bridge with Resistance Band  - 1 x daily - 2-3 x weekly - 2 sets - 10 reps - Sidelying Hip Abduction  - 1 x daily - 2-3 x weekly - 3 sets - 10 reps  PATIENT EDUCATION: Education details: floor to stand transfer practice technique Person educated: Patient Education method: Explanation, Demonstration, Tactile cues, Verbal cues, and Handouts Education comprehension: verbalized understanding and returned demonstration    -------------------------------------------- Note: Objective measures were completed at Evaluation unless otherwise noted.  DIAGNOSTIC FINDINGS: Interval proximal left femoral cephalomedullary nail fixation of the previously seen intertrochanteric fracture. Improved alignment.  COGNITION: Overall cognitive status: Within functional limits for tasks assessed   SENSATION: Reports decreased sensation in L fingers (to see hand surgeon)  EDEMA:  Noted LLE; wearing compression stocking  POSTURE: rounded shoulders and increased thoracic kyphosis  LOWER EXTREMITY ROM:     Active  Right Eval Left Eval  Hip flexion    Hip extension     Hip abduction    Hip adduction    Hip internal rotation    Hip external rotation    Knee flexion    Knee extension    Ankle dorsiflexion  10  Ankle plantarflexion  40  Ankle inversion    Ankle eversion     (Blank rows = not tested)  LOWER EXTREMITY MMT:    MMT Right Eval Left Eval  Hip flexion 4 3+  Hip extension    Hip abduction 5 4  Hip adduction 5 4  Hip internal rotation    Hip external rotation    Knee flexion 4+ 4+  Knee extension 4+ 4  Ankle dorsiflexion 4+ 4  Ankle plantarflexion    Ankle inversion    Ankle eversion    (Blank rows = not tested)  TRANSFERS: Sit to stand: Modified independence  Assistive device utilized: None     Stand to sit: Modified independence  Assistive device utilized: hands at chair      STAIRS: Findings: Level of Assistance: Modified independence, Stair Negotiation Technique: Step to Pattern with Single Rail on Right, Number of Stairs: 2-3, Height of Stairs: 4-6   , and Comments: prior to fracture, pt negotiated steps alternating pattern GAIT: Findings: Gait Characteristics: step through pattern, decreased step length- Right, and decreased stance time- Left, Distance walked: 50 ft x 2, Assistive device utilized:Walker - 2 wheeled and SPC with small quad tip base, Level of assistance: Modified independence, CGA, and Min A, and Comments: Mod I with RW, CGA/min assist with cane  FUNCTIONAL TESTS:  10 meter walk test: 16.78 sec RW (1.95 ft/sec); 33.62 sec with cane (0.98 ft/sec) Berg:  34/56 (Scores <45/56 indicate increased fall risk)  TREATMENT DATE: 08/30/2024    PATIENT EDUCATION: Education details: Eval results, POC, continue HEP from HHPT; verbally added lateral weightshifting to LLE, with attention to light UE support Person educated: Patient Education method: Explanation, Demonstration, and  Verbal cues Education comprehension: verbalized understanding, returned demonstration, and needs further education  HOME EXERCISE PROGRAM: Verbally initiated lateral weightshifting; pt doing standing hip kicks, heel/toe raises, marching and sit to stand at home  GOALS: Goals reviewed with patient? Yes  SHORT TERM GOALS: Target date: 10/01/2024  Pt will be independent with HEP for improved gait, stair negotiation, balance. Baseline: no concerns on HEP  09/29/24 Goal status: IN PROGRESS 09/29/24  2.  Pt will improve Berg score to at least 45/56 to decrease fall risk. Baseline: 34/56>44/56 09/27/2024 Goal status: PARTIALLY MET 09/27/2024, MET 10/27/2024  3.  Pt will negotiate curb and 3 steps with cane, mod I, for improved household and community gait. Baseline: stairs with cane with CGA  09/29/24 Goal status: MET, 10/27/2024   LONG TERM GOALS: Target date: 10/29/2024>12/10/2024>UPDATED TARGET 01/07/2025  Pt will be independent with HEP for improved balance, strength, gait, stairs. Baseline: continuing to update Goal status: IN PROGRESS, 10/27/2024  2.  Pt will improve DGI score to at least 20/24 to decrease fall risk. Baseline: 11/24>16/24>18/24 12/13/24 Goal status:IN PROGRESS, 12/13/24  3.  Pt will improve gait velocity to at least 2.6 ft/sec with cane for improved gait efficiency and safety. Baseline: 0.98 ft/sec>2.1 ft/sec with cane 10/27/2024 (2.5 ft/sec no cane 12/13/2024 Goal status: IN PROGRESS, 12/13/2024  4.  Pt will ambulate at least 500 ft, indoor and outdoor gait, mod I with cane for improved community mobility. Baseline: community distances with daughter, per report Goal status: MET, 10/27/2024  5.  Pt will improve TUG score to less than or equal to 13.5 sec without device, for decreased fall risk.  Baseline:  TUG no cane 17.13 sec 10/27/24>15.5 sec 12/13/2024  Goal status:  IN PROGRESS 12/13/24  6.  Pt will improve TUG manual score to less than or equal to 15 sec for  decreased fall risk.  Baseline:  19.87 sec 10/27/24; 17 sec 12/13/24  Goal status:  IN PROGRESS  7.  Pt will improve gait velocity *with no cane* to at least 2.3 ft/sec for improved gait efficiency and safety.  Baseline:  1.82 ft/sec 10/27/24>2.51 ft/sec 12/13/2024  Goal status:  MET, 12/13/2024  8.  Pt will improve 5x sit<>stand to less than or equal to 14.5 sec to demonstrate improved functional strength and transfer efficiency.  Baseline:  16.16 sec 10/27/24, 18.06 sec 12/13/24  Goal status:  IN PROGRESS    ASSESSMENT:  CLINICAL IMPRESSION: Instruction in floor to stand transfers with demo using dining room chair to enable UE support for kneel to lunge and demo good performance modified independent.  Continued with dynamic strengthening activities to improve LLE single limb support with emphasis on to hip abd/add. Balance activities to improve LLE stance control and safety with things such as stepping up or over obstacles all performed without use of AD.  Occasionally CGA or UE support needed during LLE single limb stance but minimal hip drop appreciated.  Discussed initiating D/C planning at this time and pt amenable OBJECTIVE IMPAIRMENTS: Abnormal gait, decreased balance, decreased mobility, difficulty walking, and decreased strength.   ACTIVITY LIMITATIONS: standing, stairs, transfers, and locomotion level  PARTICIPATION LIMITATIONS: meal prep, cleaning, driving, shopping, community activity, yard work, and church  PERSONAL FACTORS: 3+ comorbidities: see PMH above are also affecting patient's functional  outcome.   REHAB POTENTIAL: Good  CLINICAL DECISION MAKING: Evolving/moderate complexity  EVALUATION COMPLEXITY: Moderate  PLAN:  PT FREQUENCY: 2x/week  PT DURATION: 6 weeks per recert 10/27/2024  PLANNED INTERVENTIONS: 97750- Physical Performance Testing, 97110-Therapeutic exercises, 97530- Therapeutic activity, 97112- Neuromuscular re-education, 97535- Self Care, [id number]- Manual  therapy, [phone]- Gait training, Patient/Family education, Balance training, Stair training, and DME instructions  PLAN FOR NEXT SESSION:  Goals check, HEP refinement/advancement, and ready for D/C     "

## 2025-01-05 ENCOUNTER — Ambulatory Visit: Admitting: Physical Therapy

## 2025-01-05 ENCOUNTER — Ambulatory Visit: Admitting: Occupational Therapy

## 2025-01-07 ENCOUNTER — Other Ambulatory Visit: Payer: Self-pay

## 2025-01-11 ENCOUNTER — Other Ambulatory Visit: Payer: Self-pay

## 2025-01-11 ENCOUNTER — Other Ambulatory Visit (HOSPITAL_COMMUNITY): Payer: Self-pay

## 2025-01-12 ENCOUNTER — Encounter: Payer: Self-pay | Admitting: Physical Therapy

## 2025-01-12 ENCOUNTER — Ambulatory Visit: Admitting: Physical Therapy

## 2025-01-12 ENCOUNTER — Ambulatory Visit: Admitting: Occupational Therapy

## 2025-01-12 ENCOUNTER — Telehealth: Payer: Self-pay | Admitting: *Deleted

## 2025-01-12 DIAGNOSIS — R208 Other disturbances of skin sensation: Secondary | ICD-10-CM

## 2025-01-12 DIAGNOSIS — M6281 Muscle weakness (generalized): Secondary | ICD-10-CM

## 2025-01-12 DIAGNOSIS — R278 Other lack of coordination: Secondary | ICD-10-CM

## 2025-01-12 DIAGNOSIS — R2681 Unsteadiness on feet: Secondary | ICD-10-CM

## 2025-01-12 DIAGNOSIS — R2689 Other abnormalities of gait and mobility: Secondary | ICD-10-CM

## 2025-01-12 DIAGNOSIS — M25632 Stiffness of left wrist, not elsewhere classified: Secondary | ICD-10-CM

## 2025-01-12 NOTE — Telephone Encounter (Signed)
 Pt ready for scheduling for  PROLIA  on or after : 01/15/25 Received Rx Prolia  form pharmacy   Prior Auth: APPROVED PA# [id number] Expiration Date: 12/09/24-12/08/25   # of doses approved: 2 ----------------------------------------------------------------------- Option# 2- Med Obtained from pharmacy:   Pharmacy benefit: Copay $64 (Paid to pharmacy) Admin Fee: 20% (Pay at clinic)

## 2025-01-12 NOTE — Therapy (Signed)
 " OUTPATIENT PHYSICAL THERAPY NEURO TREATMENT NOTE/RECERT/PROGRESS NOTE  Patient Name: Monica Curry MRN: [id number] DOB:October 31, 1931, 89 y.o., female Today's Date: 01/12/2025   PCP: Wendolyn Jenkins Jansky, MD REFERRING PROVIDER: Danton Lauraine LABOR, PA-C    Progress Note Reporting Period 11/24/2024 to 01/12/2025  See note below for Objective Data and Assessment of Progress/Goals.       END OF SESSION:  PT End of Session - 01/12/25 1022     Visit Number 28    Number of Visits 31    Date for Recertification  02/04/25    Authorization Type [employer]-reauth submitted 01/12/2025    Authorization Time Period --    Authorization - Visit Number --    Authorization - Number of Visits --    Progress Note Due on Visit --   PN completed visit 28   PT Start Time 1021    PT Stop Time 1100    PT Time Calculation (min) 39 min    Equipment Utilized During Treatment Gait belt    Activity Tolerance Patient tolerated treatment well    Behavior During Therapy WFL for tasks assessed/performed                              Past Medical History:  Diagnosis Date   Allergy    Medications and pollen   Anemia    Anxiety    Always   C. difficile colitis    Cancer (HCC)    Endometrial cancer   Cataract    Had surgery 2021   Chronic kidney disease 2018   2 siblings had kidney failure, sister had transplant   Clotting disorder    Essential and other specified forms of tremor    Hyperlipidemia    Hypertension    Osteoporosis 11/2019   T score -2.8 stable from prior DEXA   Personal history of venous thrombosis and embolism 1997   Pulmonary embolism (HCC) 06/14/2021   Pulmonary embolus (HCC)    Shingles    Stroke (HCC) 2018   TIA   Thrush    TIA (transient ischemic attack)    carotid US  07/2022: no ICA stenosis bilaterally   Past Surgical History:  Procedure Laterality Date   ABDOMINAL HYSTERECTOMY  1997   APPENDECTOMY  1961   CARPAL TUNNEL RELEASE Left 10/12/2024    cataract surg Bilateral 2021   Dr. Roz, R eye 8/18, L 8/25   CESAREAN SECTION  1961   EYE SURGERY  2021   Cataract both eyes   FRACTURE SURGERY  2025   Broken hip   INTRAMEDULLARY (IM) NAIL INTERTROCHANTERIC Left 04/28/2024   Procedure: FIXATION, FRACTURE, INTERTROCHANTERIC, WITH INTRAMEDULLARY ROD;  Surgeon: Kendal Franky SQUIBB, MD;  Location: [facility] OR;  Service: Orthopedics;  Laterality: Left;   TONSILLECTOMY AND ADENOIDECTOMY  1944   TOTAL ABDOMINAL HYSTERECTOMY W/ BILATERAL SALPINGOOPHORECTOMY  1997   Patient Active Problem List   Diagnosis Date Noted   RLS (restless legs syndrome) 06/15/2024   Fracture, [place], left, closed 04/28/2024   Hip fracture (HCC) 04/27/2024   GAD (generalized anxiety disorder) 04/01/2024   Thyroid  nodule 01/10/2022   Anxiety 08/10/2021   Aortic atherosclerosis (HCC) - CT 06/2021 06/15/2021   Bilateral renal cysts 07/06/2020   Paroxysmal supraventricular tachycardia 10/20/2019   History of skin cancer (basal cell) 06/16/2019   Complete right bundle branch block 06/16/2019   B12 deficiency 06/15/2019   History of TIA (transient ischemic attack) 10/23/2017   Chronic  kidney disease, stage 3b (HCC) 07/21/2015   Medication management 07/04/2014   Essential hypertension 11/25/2013   Hyperlipidemia, mixed 11/25/2013   Abnormal glucose 11/25/2013   Vitamin D  deficiency 11/25/2013   Essential tremor 11/25/2013   History of endometrial cancer    Hx/o DVT/PE    Urethral prolapse    Osteoporosis 10/10/2011    ONSET DATE: 04/28/2024  REFERRING DIAG: S/P L intertrachanteric femur fracture, Left distal radius fracture 04/28/2024   THERAPY DIAG:  Muscle weakness (generalized)  Other abnormalities of gait and mobility  Unsteadiness on feet  Rationale for Evaluation and Treatment: Rehabilitation  SUBJECTIVE:                                                                                                                                                                                              SUBJECTIVE STATEMENT: Was able to get down to the floor to light the gas logs.  Otherwise, I have been lazy, not able to get out due to the weather.  Pt accompanied by: self  PERTINENT HISTORY: osteoporosis, hx of DVT; see additional PMH above  PAIN:  Are you having pain? No   PRECAUTIONS: Fall, wears L compression stocking due to L LE edema (was negative for DVT)  **CTR surgery LUE 10/12/24:  wearing bandage for 5 days, no lifting, to return to MD 11/18 for removal of stitches, follow up  RED FLAGS: None   WEIGHT BEARING RESTRICTIONS: No  FALLS: Has patient fallen in last 6 months? Yes. Number of falls 1  LIVING ENVIRONMENT: Lives with: lives alone Lives in: House/apartment Stairs: 3 steps to enter home;  Has following equipment at home: Single point cane and Walker - 2 wheeled *cane has tripod base and has gotten quad tip (per 10/28 visit) PLOF: Independent  PATIENT GOALS: To be able to transition to the cane, so I can get in and out of home better. 12/13/24:  To be able to maneuver better while carrying things.    OBJECTIVE:     TODAY'S TREATMENT: 01/12/25 Activity Comments  Gait velocity Cane:  13.9 sec (2.41 ft/sec) No cane:  14.38 sec (2.28 ft/sec)   DGI 19/24 Improved from 18/24  TUG 14.79 sec   TUG manual 16.56 sec   FTSTS:  13.91 sec   Gait at end of session with cane Cues to attend to posture, step length     HOME EXERCISE PROGRAM 12/15/24 Access Code: [email or web address] URL: [email or web address] Date: 12/15/2024 Prepared by: [facility] - Outpatient  Rehab - [facility]  Exercises - Corner Balance Feet Together: Eyes Closed With Head Turns  - 1 x daily -  5 x weekly - 3 reps - 30 sec hold - Seated Hamstring Stretch  - 1 x daily - 5 x weekly - 1 sets - 3 reps - 30 sec hold - Standing Foot Lift on Box (BKA)  - 1 x daily - 5 x weekly - 2 sets - 10 reps - Standing Hip Abduction with Ankle Weight  - 1 x daily - 2-3  x weekly - 2 sets - 10 reps - Standing Hip Extension with Ankle Weight  - 1 x daily - 2-3 x weekly - 2 sets - 10 reps - Sit to Stand with Resistance Around Legs  - 1 x daily - 2-3 x weekly - 2 sets - 10 reps - Supine Bridge with Resistance Band  - 1 x daily - 2-3 x weekly - 2 sets - 10 reps - Sidelying Hip Abduction  - 1 x daily - 2-3 x weekly - 3 sets - 10 reps  PATIENT EDUCATION: Education details: 01/13/25:  Discussed POC, progression towards goals; discussed potential options for community fitness (either online or in person) and pt expresses initial interest   Person educated: Patient Education method: Explanation, Demonstration, Tactile cues, Verbal cues, and Handouts Education comprehension: verbalized understanding and returned demonstration    -------------------------------------------- Note: Objective measures were completed at Evaluation unless otherwise noted.  DIAGNOSTIC FINDINGS: Interval proximal left femoral cephalomedullary nail fixation of the previously seen intertrochanteric fracture. Improved alignment.  COGNITION: Overall cognitive status: Within functional limits for tasks assessed   SENSATION: Reports decreased sensation in L fingers (to see hand surgeon)  EDEMA:  Noted LLE; wearing compression stocking  POSTURE: rounded shoulders and increased thoracic kyphosis  LOWER EXTREMITY ROM:     Active  Right Eval Left Eval  Hip flexion    Hip extension    Hip abduction    Hip adduction    Hip internal rotation    Hip external rotation    Knee flexion    Knee extension    Ankle dorsiflexion  10  Ankle plantarflexion  40  Ankle inversion    Ankle eversion     (Blank rows = not tested)  LOWER EXTREMITY MMT:    MMT Right Eval Left Eval  Hip flexion 4 3+  Hip extension    Hip abduction 5 4  Hip adduction 5 4  Hip internal rotation    Hip external rotation    Knee flexion 4+ 4+  Knee extension 4+ 4  Ankle dorsiflexion 4+ 4  Ankle  plantarflexion    Ankle inversion    Ankle eversion    (Blank rows = not tested)  TRANSFERS: Sit to stand: Modified independence  Assistive device utilized: None     Stand to sit: Modified independence  Assistive device utilized: hands at chair      STAIRS: Findings: Level of Assistance: Modified independence, Stair Negotiation Technique: Step to Pattern with Single Rail on Right, Number of Stairs: 2-3, Height of Stairs: 4-6   , and Comments: prior to fracture, pt negotiated steps alternating pattern GAIT: Findings: Gait Characteristics: step through pattern, decreased step length- Right, and decreased stance time- Left, Distance walked: 50 ft x 2, Assistive device utilized:Walker - 2 wheeled and SPC with small quad tip base, Level of assistance: Modified independence, CGA, and Min A, and Comments: Mod I with RW, CGA/min assist with cane  FUNCTIONAL TESTS:  10 meter walk test: 16.78 sec RW (1.95 ft/sec); 33.62 sec with cane (0.98 ft/sec) Berg:  34/56 (Scores <45/56 indicate  increased fall risk)   [facility] PT Assessment - 01/12/25 1029       Dynamic Gait Index   Level Surface Mild Impairment    Change in Gait Speed Normal    Gait with Horizontal Head Turns Mild Impairment    Gait with Vertical Head Turns Normal    Gait and Pivot Turn Normal    Step Over Obstacle Mild Impairment    Step Around Obstacles Mild Impairment    Steps Mild Impairment    Total Score 19    DGI comment: improved from 18/24                                                                                                                                        TREATMENT DATE: 08/30/2024    PATIENT EDUCATION: Education details: Eval results, POC, continue HEP from HHPT; verbally added lateral weightshifting to LLE, with attention to light UE support Person educated: Patient Education method: Explanation, Demonstration, and Verbal cues Education comprehension: verbalized understanding, returned  demonstration, and needs further education  HOME EXERCISE PROGRAM: Verbally initiated lateral weightshifting; pt doing standing hip kicks, heel/toe raises, marching and sit to stand at home  GOALS: Goals reviewed with patient? Yes  SHORT TERM GOALS: Target date: 10/01/2024  Pt will be independent with HEP for improved gait, stair negotiation, balance. Baseline: no concerns on HEP  09/29/24 Goal status: IN PROGRESS 09/29/24  2.  Pt will improve Berg score to at least 45/56 to decrease fall risk. Baseline: 34/56>44/56 09/27/2024 Goal status: PARTIALLY MET 09/27/2024, MET 10/27/2024  3.  Pt will negotiate curb and 3 steps with cane, mod I, for improved household and community gait. Baseline: stairs with cane with CGA  09/29/24 Goal status: MET, 10/27/2024   LONG TERM GOALS: Target date: 10/29/2024>12/10/2024>UPDATED TARGET 01/07/2025  Pt will be independent with HEP for improved balance, strength, gait, stairs. Baseline: continuing to update Goal status: MET per report and previous check, 01/12/2025  2.  Pt will improve DGI score to at least 20/24 to decrease fall risk. Baseline: 11/24>16/24>18/24 12/13/24; 01/12/2025 19/24 Goal status: PARTIALLY MET 01/12/2025  3.  Pt will improve gait velocity to at least 2.6 ft/sec with cane for improved gait efficiency and safety. Baseline: 0.98 ft/sec>2.1 ft/sec with cane 10/27/2024 (2.5 ft/sec no cane 12/13/2024); 2.28 ft/sec no cane; 2.4 ft/sec with cane 01/12/2025 Goal status:  NOT MET 01/12/25  4.  Pt will ambulate at least 500 ft, indoor and outdoor gait, mod I with cane for improved community mobility. Baseline: community distances with daughter, per report Goal status: MET, 10/27/2024  5.  Pt will improve TUG score to less than or equal to 13.5 sec without device, for decreased fall risk.  Baseline:  TUG no cane 17.13 sec 10/27/24>15.5 sec 12/13/2024; 14.79 sec 01/12/25  Goal status:  PARTIALLY MET 01/12/2025  6.  Pt will improve TUG manual score to  less than  or equal to 15 sec for decreased fall risk.  Baseline:  19.87 sec 10/27/24; 17 sec 12/13/24; 16.56 sec 01/12/25  Goal status:  PARTIALLY MET 01/12/2025  7.  Pt will improve gait velocity *with no cane* to at least 2.3 ft/sec for improved gait efficiency and safety.  Baseline:  1.82 ft/sec 10/27/24>2.51 ft/sec 12/13/2024  Goal status:  MET, 12/13/2024  8.  Pt will improve 5x sit<>stand to less than or equal to 14.5 sec to demonstrate improved functional strength and transfer efficiency.  Baseline:  16.16 sec 10/27/24, 18.06 sec 12/13/24; 13.91 sec 01/12/2025  Goal status:  MET, 01/12/25  LONG TERM GOALS:  TARGET 02/04/2025   1.  Pt will verbalize understanding of local community fitness resources, to maximize gains made in PT.    Baseline:      Goal status:  INITIAL       2.  Pt will improve TUG score to less than or equal to 13.5 sec for decreased fall risk.     Baseline:  14.79 sec    Goal status:  INITIAL    3.  Pt will improve gait velocity to at least 2.62 ft/sec (with cane) for improved gait efficiency and safety.    Baseline:  2.4 ft/sec    Goal status:  INITIAL    4.  Pt will perform floor>stand transfer, mod independently, for improved functional mobility and fall recovery.    Baseline:UE support, min guard     Goal status:  INITIAL       ASSESSMENT:  CLINICAL IMPRESSION: Pt presents today after being away for several visits due to inclement weather and illness.  Skilled PT session focused on assessing LTGs, with pt meeting 4 of 8 LTGs.  She has partially met LTG 2 for DGI score 19/24, LTG 5 for TUG score and LTG 6 for TUG manual score.  She has improved FTSTS score to <14 seconds.  She has not met LTG 3 for gait velocity with cane.  Pt has been on target towards goals, however, given recent absences due to weather and illness, pt has not yet met her goals fully.  Updated goals to reflect additional POC, with plans to continue 1x/wk for 4 additional visits.  She will continue to  benefit from skilled PT to address further improved balance and decreased fall risk.    OBJECTIVE IMPAIRMENTS: Abnormal gait, decreased balance, decreased mobility, difficulty walking, and decreased strength.   ACTIVITY LIMITATIONS: standing, stairs, transfers, and locomotion level  PARTICIPATION LIMITATIONS: meal prep, cleaning, driving, shopping, community activity, yard work, and church  PERSONAL FACTORS: 3+ comorbidities: see PMH above are also affecting patient's functional outcome.   REHAB POTENTIAL: Good  CLINICAL DECISION MAKING: Evolving/moderate complexity  EVALUATION COMPLEXITY: Moderate  PLAN:  PT FREQUENCY: 1x/week  PT DURATION: 4 weeks including 01/12/2025 visit  PLANNED INTERVENTIONS: 97750- Physical Performance Testing, 97110-Therapeutic exercises, 97530- Therapeutic activity, 97112- Neuromuscular re-education, 97535- Self Care, [id number]- Manual therapy, [phone]- Gait training, Patient/Family education, Balance training, Stair training, and DME instructions  PLAN FOR NEXT SESSION:  Community fitness information; gait training with attention to posture, step length/consider weighted carry  Greig Anon, PT 01/12/25 12:22 PM Phone: [phone] Fax: [phone]  [facility] [address], [address] [place], [place] [address] Phone # [phone] Fax # [phone]  Referring diagnosis:  S/P L intertrachanteric femur fracture, Left distal radius fracture 04/28/2024 Treatment diagnosis (if different than referring diagnosis): M62.81, R26.89, R26.81 Date Symptoms Began: 04/28/2024 #  of Visits requested: 4  Time period for Authorization: 01/12/25 to 02/04/25  What was this (referring dx) caused by? []  Surgery [x]  Fall []  Ongoing issue []  Arthritis []  Other: ____________  Laterality: []  Rt [x]  Lt []  Both  Functional Tool & Score: DGI 19/24, TUG score 14.79 sec, TUG manual 16.56 sec; gait velocity 2.4 ft/sec  Check all  possible CPT codes:     See Planned Interventions listed in the Plan section of the Evaluation.     If [employer]: Choose 10 or less codes  If [employer]: Modalities are not covered  If [employer]: Check allowed ICD code combinations   If [facility] Plan or [employer]: Cognitive training not covered      "

## 2025-01-12 NOTE — Therapy (Signed)
 " OUTPATIENT OCCUPATIONAL THERAPY ORTHO Treatment Note  Patient Name: Monica Curry MRN: [id number] DOB:August 08, 1931, 89 y.o., female Today's Date: 01/12/2025  PCP: Wendolyn Jenkins Jansky, MD  REFERRING PROVIDER: Arlinda Buster, MD  END OF SESSION:  OT End of Session - 01/12/25 1016     Visit Number 14    Number of Visits 21    Date for Recertification  01/21/25    Authorization Type [employer]    Authorization Time Period Auth#: [id number], approved 10 more OT visits from 12/17/24-03/17/25    Authorization - Visit Number 5    Authorization - Number of Visits 10    OT Start Time 0932    OT Stop Time 1015    OT Time Calculation (min) 43 min    Activity Tolerance Patient tolerated treatment well    Behavior During Therapy [facility] for tasks assessed/performed                      Past Medical History:  Diagnosis Date   Allergy    Medications and pollen   Anemia    Anxiety    Always   C. difficile colitis    Cancer (HCC)    Endometrial cancer   Cataract    Had surgery 2021   Chronic kidney disease 2018   2 siblings had kidney failure, sister had transplant   Clotting disorder    Essential and other specified forms of tremor    Hyperlipidemia    Hypertension    Osteoporosis 11/2019   T score -2.8 stable from prior DEXA   Personal history of venous thrombosis and embolism 1997   Pulmonary embolism (HCC) 06/14/2021   Pulmonary embolus (HCC)    Shingles    Stroke (HCC) 2018   TIA   Thrush    TIA (transient ischemic attack)    carotid US  07/2022: no ICA stenosis bilaterally   Past Surgical History:  Procedure Laterality Date   ABDOMINAL HYSTERECTOMY  1997   APPENDECTOMY  1961   CARPAL TUNNEL RELEASE Left 10/12/2024   cataract surg Bilateral 2021   Dr. Roz, R eye 8/18, L 8/25   CESAREAN SECTION  1961   EYE SURGERY  2021   Cataract both eyes   FRACTURE SURGERY  2025   Broken hip   INTRAMEDULLARY (IM) NAIL INTERTROCHANTERIC Left 04/28/2024    Procedure: FIXATION, FRACTURE, INTERTROCHANTERIC, WITH INTRAMEDULLARY ROD;  Surgeon: Kendal Franky SQUIBB, MD;  Location: [facility] OR;  Service: Orthopedics;  Laterality: Left;   TONSILLECTOMY AND ADENOIDECTOMY  1944   TOTAL ABDOMINAL HYSTERECTOMY W/ BILATERAL SALPINGOOPHORECTOMY  1997   Patient Active Problem List   Diagnosis Date Noted   RLS (restless legs syndrome) 06/15/2024   Fracture, Col[place], left, closed 04/28/2024   Hip fracture (HCC) 04/27/2024   GAD (generalized anxiety disorder) 04/01/2024   Thyroid  nodule 01/10/2022   Anxiety 08/10/2021   Aortic atherosclerosis (HCC) - CT 06/2021 06/15/2021   Bilateral renal cysts 07/06/2020   Paroxysmal supraventricular tachycardia 10/20/2019   History of skin cancer (basal cell) 06/16/2019   Complete right bundle branch block 06/16/2019   B12 deficiency 06/15/2019   History of TIA (transient ischemic attack) 10/23/2017   Chronic kidney disease, stage 3b (HCC) 07/21/2015   Medication management 07/04/2014   Essential hypertension 11/25/2013   Hyperlipidemia, mixed 11/25/2013   Abnormal glucose 11/25/2013   Vitamin D  deficiency 11/25/2013   Essential tremor 11/25/2013   History of endometrial cancer    Hx/o DVT/PE    Urethral prolapse  Osteoporosis 10/10/2011    ONSET DATE: referral date 10/07/24 (injury 04/27/24 and [facility] sx 10/12/24)  REFERRING DIAG: G56.02 (ICD-10-CM) - Carpal tunnel syndrome, left upper limb  THERAPY DIAG:  Muscle weakness (generalized)  Stiffness of left wrist, not elsewhere classified  Other lack of coordination  Other disturbances of skin sensation  Rationale for Evaluation and Treatment: Rehabilitation  SUBJECTIVE:   SUBJECTIVE STATEMENT: Pt states that the whole hands feels more numb with this cold weather  Pt accompanied by: self  PERTINENT HISTORY:   Per MD note 10/26/24: 89 y.o. female who presents today for follow up 2 weeks status post left wrist open carpal tunnel release.  Doing well overall, pain  controlled, does have persistent numbness and tingling.  Left hand: - Well-healing palmar incision, sutures removed, skin edges well-approximated without erythema or drainage - Composite fist limited secondary to stiffness, improved passively - Sensation remains diminished to light touch in the median nerve distribution - 4/5 APB mild thenar atrophy, thumb opposition to ring finger PIP  PRECAUTIONS: Fall  WEIGHT BEARING RESTRICTIONS: No  PAIN:  Are you having pain? Yes: NPRS scale: minimal Pain location: palm of hand Pain description: twinge Aggravating factors: touching hand around incision Relieving factors: being still  FALLS: Has patient fallen in last 6 months? Yes. Number of falls 1 - fall in May that resulted in hip and wrist fractures.  LIVING ENVIRONMENT: Lives with: lives alone Lives in: House/apartment Stairs: 3 steps to enter, flight of steps to the basement but she does not go down them Has following equipment at home: [name] - 2 wheeled, single point cane, shower chair, and Grab bars  PLOF: Independent with basic ADLs and Requires assistive device for independence; step-daughter is currently doing laundry at her home as pt's washer/dryer are in the basement  PATIENT GOALS: to be able to use Left hand again   NEXT MD VISIT: 11/23/24  OBJECTIVE:  Note: Objective measures were completed at Evaluation unless otherwise noted.  HAND DOMINANCE: Right  ADLs: WFL; difficulty with donning compression sock on L leg but is able to don  FUNCTIONAL OUTCOME MEASURES: Quick Dash: 50%   12/07/24   UPPER EXTREMITY ROM:     Active ROM Right eval Left eval Left 11/22/24 Left 12/07/24  Shoulder flexion      Shoulder abduction      Shoulder adduction      Shoulder extension      Shoulder internal rotation      Shoulder external rotation      Elbow flexion      Elbow extension      Wrist flexion 75 34 30 38  Wrist extension 60 17 40 36  Wrist ulnar deviation  30 18 22 24   Wrist radial deviation 12 10 8 12   Wrist pronation  WFL    Wrist supination  80%  100%  (Blank rows = not tested)  Loose grasp on L, however all fingers are able to touch palm of hand lightly in open fist, decreased full fist, hook fist, and table top  HAND FUNCTION: Not assessed due to stitches just removed yesterday.  On eval prior to surgery 08/30/24: Right: 40 lbs; Left: 5 lbs  12/07/24: Grip strength:  COORDINATION: 9 Hole Peg test: Right: 30.13 sec; Left: 61.75 sec; Mild B tremors  11/22/24: left: 39.56 sec  12/07/24 Left: 37.21 sec; min-moderate tremors this session  SENSATION: Numbness and tingling in L hand and digits along median nerve distribution  EDEMA: NA  COGNITION:  Overall cognitive status: Within functional limits for tasks assessed   OBSERVATIONS: Pt L wrist at ulnar head and dorsal wrist enlarged compared to R wrist and hand due to bone deformity at dorsal wrist and ulnar styloid s/p setting of fracture back in May 2025. Pt demonstrating ~50% movement in all directions when compared to R, but verbalizing good understanding of exercises.    TREATMENT DATE:  01/12/25 Hand/wrist exercises: completed variety of movements to address strengthening and ROM. Towel scrunches: completed x8 with LUE, added 2# medicine ball for increased resistance  Dowel rotation: supination and pronation, wrist flexion and extension in both palm up and palm down positioning.   Engaged in rolling ball on frisbee with BUE to different numbered targets for increased spontaneous movements and changes of directions. Median nerve glides: Completed in sitting and standing, completing 3 different movements with shoulder based movement and forearm based movement.  OT providing demonstration and min cues for improvement in technique.  Each exercise added to HEP (see below).   12/31/24 Heat: applied moist heat to L wrist prior to engaging in wrist ROM and strengthening. Pt reports  hyper-sensitivity in long finger and feeling that it is worse than the others and maybe worse than before. Engaged in discussion of typical sensory recovery s/p carpal tunnel release. Pt with skin intact pre and post heat.   Wrist extension: completed prayer stretch x5 for 10-15 seconds.  Engaged in ball rolling in standing for increased WB and wrist extension x15.  Pt with no reports of pain or changes in sensation with increased demand. Hand gripper: Engaged in full grasp on blue (light) digiflex x10 with focus on sustained hold 2-3 seconds for increased grip strengthening. Coordination/ROM: engaged in flipping cards with L hand to match by colors with focus on forearm supination and wrist extension.     12/20/24 ROM: engaged in ball exercises with L hand, rotating ball clockwise and counter-clockwise in hand x5-10 each direction x2.  Transitioned to squeezing ball and holding ~20 seconds at a time for grip strength. BUE strengthening: engaged in counter top pushups x10 with focus on UB strengthening and increasing symmetry.  Pt still biasing weight shift and increased effort through RUE due to decreased L wrist extension.  Modified to BUE on wall to decrease wrist extension while still allowing for UB strengthening with pt tolerating modified position. Strengthening: engaged in supination/pronation, elbow flexion, overhead extension, and tricep extension with 1# dumbbell.  OT providing demonstration and rationale to carryover of UE strengthening as needed for ADLs and IADLs.    PATIENT EDUCATION: Education details: ROM, strengthening Person educated: Patient Education method: Explanation, Demonstration, Verbal cues, and Handouts Education comprehension: verbalized understanding and needs further education  HOME EXERCISE PROGRAM: Carpal tunnel release HEP (see pt instructions)  Access Code: [email or web address] URL: [email or web address] Date: 01/12/2025 Prepared by: [facility]  - [facility]  Exercises - Putty Squeezes  - 2 x daily - 15 reps - 3-Point Pinch with Putty  - 2 x daily - 15 reps - Finger Lumbricals with Putty  - 2 x daily - 15 reps - Seated Wrist Extension Stretch  - 2 x daily - 5-10 reps - Seated Wrist Flexion Stretch  - 2 x daily - 5-10 reps - Seated Wrist Prayer Stretch  - 2 x daily - 10 reps - ~20 sec hold - Push-Up on Counter  - 2 x daily - 10 reps - Seated Forearm Pronation and Supination with Hammer  - 2  x daily - 10 reps - Seated Single Arm Bicep Curls with Rotation and Dumbbell  - 2 x daily - 10 reps - Seated Overhead Press  - 2 x daily - 10 reps - Seated Overhead Elbow Extension  - 2 x daily - 10 reps - Median Nerve Flossing  - 2 x daily - 15 reps - Standing Median Nerve Glide  - 2 x daily - 15 reps - Seated Median Nerve Glide  - 2 x daily - 15 reps  GOALS: Goals reviewed with patient? Yes   SHORT TERM GOALS: Target date: 12/31/24  Pt will be independent with HEP for ROM and strengthening. Baseline: new to OPOT Goal status: in progress   2.  Pt will verbalize understanding of task modifications and/or potential A/E needs to increase ease, safety, and independence w/ ADLs. Baseline: difficulty with donning compression socks d/t decreased grip and mobility of L wrist/hand Goal status: in progress  3.  Pt will verbalize understanding of re-sensitization strategies as well as compensatory strategies due to impaired sensation.  Baseline: impaired sensation in hand at median nerve distribution  Goal status: in progress    LONG TERM GOALS: Target date: 01/21/25  1.  Pt will improve grip strength in left hand to be within at least 20 pounds of her right hand for functional use at home and in IADLs.  Baseline: L: 5# and R: 40# 12/07/24: L: 7.5# Goal status: in progress   2.  Pt will demonstrate improved L wrist flexion/extension to 75% of R wrist flexion/extension for improved functional use.  Baseline: R:75/60 and L:  34/17 12/07/24: L: 38/36 Goal status: in progress   3.  Pt will demonstrate improvements in pain and functional use of LUE as evidenced by decreased score on QuickDASH from 50% impairement to < 30 % impairment.  Baseline: 50% 12/07/24: 40.9% Goal status: in progress   ASSESSMENT:  CLINICAL IMPRESSION: Patient is a 89 y.o. female who was seen today for occupational therapy treatment s/p L carpal tunnel release. Pt demonstrating tolerance of novel ROM and strengthening activities with increased focus on spontaneous movements and quick changes of direction.  Engaged in nerve glides with focus on full arm and forearm movements.  Pt receptive to recovery process and continued engagement in ROM exercises and re-sensitization activities. Pt will continue to benefit from skilled OT services to address ROM, sensation, strengthening, and improved functional use of LUE.  PERFORMANCE DEFICITS: in functional skills including ADLs, IADLs, coordination, sensation, edema, ROM, strength, flexibility, Fine motor control, Gross motor control, body mechanics, endurance, decreased knowledge of precautions, and UE functional use and psychosocial skills including routines and behaviors.      PLAN:  OT FREQUENCY: 1-2x/week  OT DURATION: 6 weeks  PLANNED INTERVENTIONS: 97168 OT Re-evaluation, 97535 self care/ADL training, 02889 therapeutic exercise, 97530 therapeutic activity, 97112 neuromuscular re-education, 97140 manual therapy, 97035 ultrasound, 97018 paraffin, 02239 Orthotic Initial, 97763 Orthotic/Prosthetic subsequent, passive range of motion, compression bandaging, coping strategies training, patient/family education, and DME and/or AE instructions   RECOMMENDED OTHER SERVICES: NA  CONSULTED AND AGREED WITH PLAN OF CARE: Patient  PLAN FOR NEXT SESSION: Continue use of modalities for ROM and pain relief, after 4-6 weeks s/p sx can start light strengthening, progress STGs/LTGs  Review UB  strengthening, may even introduce theraband HEP if appropriate  Review goals and re-cert vs d/c at next visit     KAYLENE DOMINO, OTR/L 01/12/2025, 10:16 AM  [facility] [address] [place], [place] [address] Phone # [phone] Fax # [phone]  "

## 2025-01-12 NOTE — Patient Instructions (Signed)
 [employer] [facility] (in-person options)  [facility] classes or [employer] classes (online options)

## 2025-01-14 NOTE — Telephone Encounter (Signed)
 LVM to schedule PROLIA  injection on or after : 01/15/25 . OOP at check in $25.00 Admin Fee

## 2025-01-19 ENCOUNTER — Ambulatory Visit

## 2025-01-26 ENCOUNTER — Ambulatory Visit

## 2025-02-02 ENCOUNTER — Ambulatory Visit

## 2025-04-04 ENCOUNTER — Encounter: Admitting: Family Medicine

## 2025-05-09 ENCOUNTER — Ambulatory Visit: Admitting: Internal Medicine

## 2025-09-06 ENCOUNTER — Ambulatory Visit
